# Patient Record
Sex: Female | Born: 1935
Health system: Southern US, Community
[De-identification: ages and names within clinical notes are randomized; demographics above are authoritative.]

## PROBLEM LIST (undated history)

## (undated) DIAGNOSIS — B029 Zoster without complications: Secondary | ICD-10-CM

## (undated) DIAGNOSIS — R569 Unspecified convulsions: Secondary | ICD-10-CM

## (undated) DIAGNOSIS — M722 Plantar fascial fibromatosis: Secondary | ICD-10-CM

## (undated) DIAGNOSIS — R55 Syncope and collapse: Secondary | ICD-10-CM

## (undated) DIAGNOSIS — N951 Menopausal and female climacteric states: Secondary | ICD-10-CM

## (undated) DIAGNOSIS — R7611 Nonspecific reaction to tuberculin skin test without active tuberculosis: Secondary | ICD-10-CM

## (undated) DIAGNOSIS — M810 Age-related osteoporosis without current pathological fracture: Secondary | ICD-10-CM

## (undated) DIAGNOSIS — F0390 Unspecified dementia without behavioral disturbance: Secondary | ICD-10-CM

## (undated) DIAGNOSIS — C55 Malignant neoplasm of uterus, part unspecified: Secondary | ICD-10-CM

## (undated) DIAGNOSIS — M549 Dorsalgia, unspecified: Secondary | ICD-10-CM

## (undated) DIAGNOSIS — I10 Essential (primary) hypertension: Secondary | ICD-10-CM

## (undated) HISTORY — DX: Menopausal and female climacteric states: N95.1

## (undated) HISTORY — PX: CATARACT EXTRACTION, BILATERAL: SHX1313

## (undated) HISTORY — PX: SIGMOIDOSCOPY: SUR1295

## (undated) HISTORY — PX: VAGINAL HYSTERECTOMY: SUR661

## (undated) HISTORY — PX: LAPAROSCOPIC CHOLECYSTECTOMY: SUR755

## (undated) HISTORY — DX: Zoster without complications: B02.9

## (undated) HISTORY — PX: DILATION AND CURETTAGE OF UTERUS: SHX78

## (undated) HISTORY — DX: Age-related osteoporosis without current pathological fracture: M81.0

## (undated) HISTORY — DX: Plantar fascial fibromatosis: M72.2

## (undated) HISTORY — PX: TONSILLECTOMY: SUR1361

## (undated) HISTORY — DX: Nonspecific reaction to tuberculin skin test without active tuberculosis: R76.11

## (undated) HISTORY — DX: Dorsalgia, unspecified: M54.9

---

## 1997-12-09 ENCOUNTER — Inpatient Hospital Stay (HOSPITAL_COMMUNITY): Admission: EM | Admit: 1997-12-09 | Discharge: 1997-12-10 | Payer: Self-pay | Admitting: Emergency Medicine

## 1997-12-13 ENCOUNTER — Encounter: Admission: RE | Admit: 1997-12-13 | Discharge: 1997-12-13 | Payer: Self-pay | Admitting: Family Medicine

## 1999-03-13 ENCOUNTER — Encounter: Payer: Self-pay | Admitting: Emergency Medicine

## 1999-03-13 ENCOUNTER — Emergency Department (HOSPITAL_COMMUNITY): Admission: EM | Admit: 1999-03-13 | Discharge: 1999-03-13 | Payer: Self-pay | Admitting: Emergency Medicine

## 2002-05-22 ENCOUNTER — Emergency Department (HOSPITAL_COMMUNITY): Admission: EM | Admit: 2002-05-22 | Discharge: 2002-05-22 | Payer: Self-pay | Admitting: Emergency Medicine

## 2004-10-22 ENCOUNTER — Ambulatory Visit: Payer: Self-pay | Admitting: Internal Medicine

## 2006-11-09 ENCOUNTER — Telehealth: Payer: Self-pay | Admitting: Internal Medicine

## 2006-11-15 ENCOUNTER — Encounter: Payer: Self-pay | Admitting: Internal Medicine

## 2006-11-16 ENCOUNTER — Ambulatory Visit: Payer: Self-pay | Admitting: Internal Medicine

## 2006-11-16 DIAGNOSIS — M722 Plantar fascial fibromatosis: Secondary | ICD-10-CM

## 2006-11-18 ENCOUNTER — Telehealth (INDEPENDENT_AMBULATORY_CARE_PROVIDER_SITE_OTHER): Payer: Self-pay | Admitting: *Deleted

## 2006-11-22 ENCOUNTER — Encounter: Payer: Self-pay | Admitting: Internal Medicine

## 2007-02-21 ENCOUNTER — Telehealth: Payer: Self-pay | Admitting: Internal Medicine

## 2007-03-11 ENCOUNTER — Ambulatory Visit: Payer: Self-pay | Admitting: Internal Medicine

## 2007-03-11 DIAGNOSIS — M810 Age-related osteoporosis without current pathological fracture: Secondary | ICD-10-CM

## 2007-03-23 ENCOUNTER — Telehealth (INDEPENDENT_AMBULATORY_CARE_PROVIDER_SITE_OTHER): Payer: Self-pay | Admitting: *Deleted

## 2007-06-08 ENCOUNTER — Telehealth: Payer: Self-pay | Admitting: Internal Medicine

## 2007-06-14 ENCOUNTER — Telehealth: Payer: Self-pay | Admitting: Internal Medicine

## 2007-06-20 ENCOUNTER — Ambulatory Visit: Payer: Self-pay | Admitting: Internal Medicine

## 2007-06-20 ENCOUNTER — Telehealth: Payer: Self-pay | Admitting: Internal Medicine

## 2007-06-20 DIAGNOSIS — N951 Menopausal and female climacteric states: Secondary | ICD-10-CM | POA: Insufficient documentation

## 2007-09-27 ENCOUNTER — Ambulatory Visit: Payer: Self-pay | Admitting: Internal Medicine

## 2007-09-27 DIAGNOSIS — B029 Zoster without complications: Secondary | ICD-10-CM | POA: Insufficient documentation

## 2008-02-23 ENCOUNTER — Encounter: Payer: Self-pay | Admitting: Internal Medicine

## 2008-04-30 ENCOUNTER — Ambulatory Visit: Payer: Self-pay | Admitting: Internal Medicine

## 2008-04-30 DIAGNOSIS — M549 Dorsalgia, unspecified: Secondary | ICD-10-CM | POA: Insufficient documentation

## 2008-08-13 ENCOUNTER — Ambulatory Visit: Payer: Self-pay | Admitting: Internal Medicine

## 2008-08-13 LAB — CONVERTED CEMR LAB
ALT: 20 units/L (ref 0–35)
AST: 28 units/L (ref 0–37)
Albumin: 3.7 g/dL (ref 3.5–5.2)
Alkaline Phosphatase: 55 units/L (ref 39–117)
BUN: 9 mg/dL (ref 6–23)
Basophils Absolute: 0 10*3/uL (ref 0.0–0.1)
Basophils Relative: 0.4 % (ref 0.0–3.0)
Bilirubin, Direct: 0.2 mg/dL (ref 0.0–0.3)
CO2: 30 meq/L (ref 19–32)
Calcium: 9.2 mg/dL (ref 8.4–10.5)
Chloride: 104 meq/L (ref 96–112)
Creatinine, Ser: 0.7 mg/dL (ref 0.4–1.2)
Eosinophils Absolute: 0.1 10*3/uL (ref 0.0–0.7)
Eosinophils Relative: 1 % (ref 0.0–5.0)
GFR calc non Af Amer: 106.06 mL/min (ref >60)
Glucose, Bld: 92 mg/dL (ref 70–99)
HCT: 45.3 % (ref 36.0–46.0)
Hemoglobin: 15.1 g/dL — ABNORMAL HIGH (ref 12.0–15.0)
Lymphocytes Relative: 42.8 % (ref 12.0–46.0)
Lymphs Abs: 3.1 10*3/uL (ref 0.7–4.0)
MCHC: 33.3 g/dL (ref 30.0–36.0)
MCV: 78.7 fL (ref 78.0–100.0)
Monocytes Absolute: 0.4 10*3/uL (ref 0.1–1.0)
Monocytes Relative: 5.5 % (ref 3.0–12.0)
Neutro Abs: 3.6 10*3/uL (ref 1.4–7.7)
Neutrophils Relative %: 50.3 % (ref 43.0–77.0)
Platelets: 236 10*3/uL (ref 150.0–400.0)
Potassium: 3.9 meq/L (ref 3.5–5.1)
RBC: 5.75 M/uL — ABNORMAL HIGH (ref 3.87–5.11)
RDW: 14.3 % (ref 11.5–14.6)
Sodium: 142 meq/L (ref 135–145)
TSH: 1.44 microintl units/mL (ref 0.35–5.50)
Total Bilirubin: 1.3 mg/dL — ABNORMAL HIGH (ref 0.3–1.2)
Total Protein: 7.5 g/dL (ref 6.0–8.3)
WBC: 7.2 10*3/uL (ref 4.5–10.5)

## 2008-08-20 ENCOUNTER — Telehealth: Payer: Self-pay | Admitting: Internal Medicine

## 2009-05-29 ENCOUNTER — Ambulatory Visit: Payer: Self-pay | Admitting: Cardiology

## 2009-05-29 ENCOUNTER — Observation Stay (HOSPITAL_COMMUNITY): Admission: EM | Admit: 2009-05-29 | Discharge: 2009-05-30 | Payer: Self-pay | Admitting: Emergency Medicine

## 2009-05-29 IMAGING — CR DG CHEST 1V PORT
1 series · 1 of 1 positions shown · non-contrast
Comparison: None.

CLINICAL DATA: Pain all over

PORTABLE CHEST - 1 VIEW

[view not recorded]
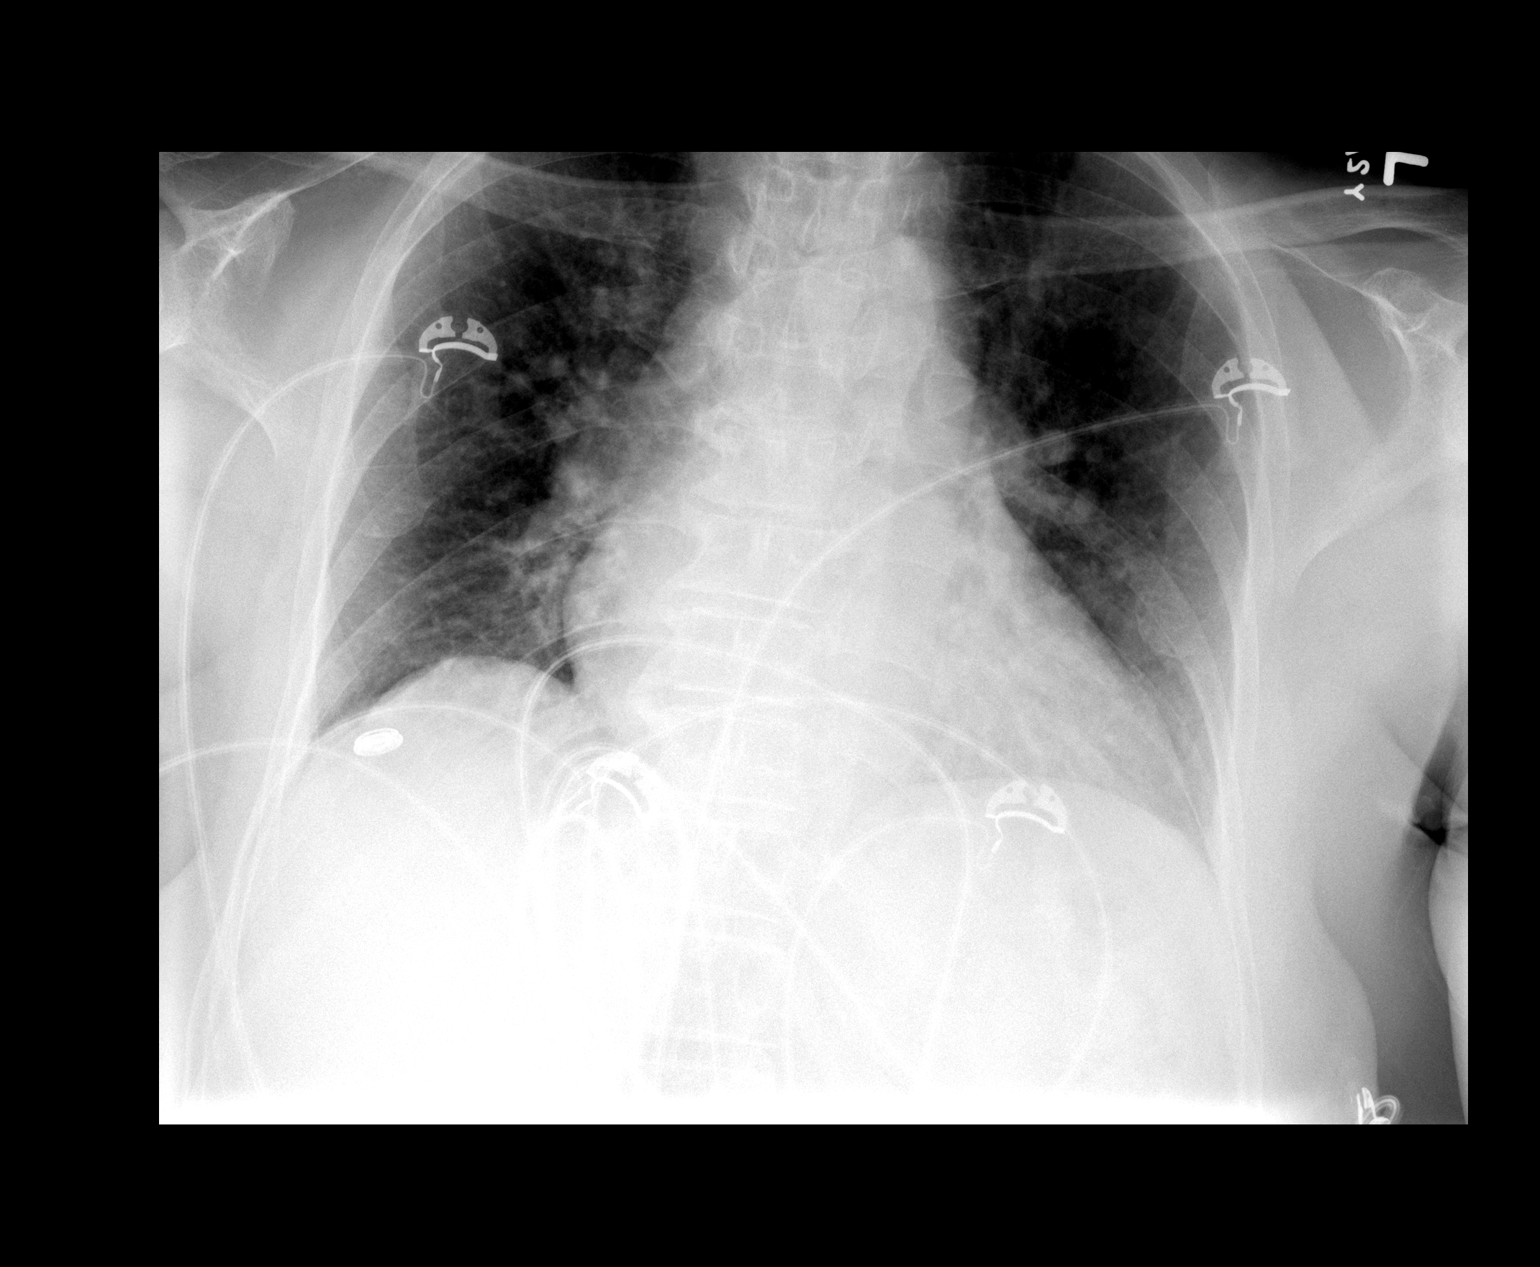

[1 of 1 positions shown; findings below may reference images not displayed]

FINDINGS: Cardiomegaly and pulmonary vascular congestion without
edema.  No pleural effusions.  Degenerative spondylosis of the
dorsal spine.
IMPRESSION: Cardiomegaly and pulmonary vascular congestion.

## 2009-05-30 ENCOUNTER — Encounter (INDEPENDENT_AMBULATORY_CARE_PROVIDER_SITE_OTHER): Payer: Self-pay | Admitting: Emergency Medicine

## 2009-06-04 ENCOUNTER — Telehealth: Payer: Self-pay | Admitting: Internal Medicine

## 2009-08-08 ENCOUNTER — Telehealth: Payer: Self-pay | Admitting: Internal Medicine

## 2009-08-09 ENCOUNTER — Encounter: Payer: Self-pay | Admitting: Internal Medicine

## 2009-08-21 ENCOUNTER — Ambulatory Visit: Payer: Self-pay | Admitting: Internal Medicine

## 2009-08-21 LAB — CONVERTED CEMR LAB
ALT: 19 units/L (ref 0–35)
AST: 28 units/L (ref 0–37)
Albumin: 3.9 g/dL (ref 3.5–5.2)
Alkaline Phosphatase: 45 units/L (ref 39–117)
BUN: 11 mg/dL (ref 6–23)
Basophils Absolute: 0 10*3/uL (ref 0.0–0.1)
Basophils Relative: 0.4 % (ref 0.0–3.0)
Bilirubin, Direct: 0.2 mg/dL (ref 0.0–0.3)
CO2: 33 meq/L — ABNORMAL HIGH (ref 19–32)
Calcium: 9.4 mg/dL (ref 8.4–10.5)
Chloride: 108 meq/L (ref 96–112)
Cholesterol: 211 mg/dL — ABNORMAL HIGH (ref 0–200)
Creatinine, Ser: 0.8 mg/dL (ref 0.4–1.2)
Direct LDL: 138.8 mg/dL
Eosinophils Absolute: 0.1 10*3/uL (ref 0.0–0.7)
Eosinophils Relative: 1 % (ref 0.0–5.0)
GFR calc non Af Amer: 90.65 mL/min (ref >60)
Glucose, Bld: 86 mg/dL (ref 70–99)
HCT: 47.1 % — ABNORMAL HIGH (ref 36.0–46.0)
HDL: 49.2 mg/dL (ref >39.00)
Hemoglobin: 15.3 g/dL — ABNORMAL HIGH (ref 12.0–15.0)
Lymphocytes Relative: 49.1 % — ABNORMAL HIGH (ref 12.0–46.0)
Lymphs Abs: 3.5 10*3/uL (ref 0.7–4.0)
MCHC: 32.5 g/dL (ref 30.0–36.0)
MCV: 81.5 fL (ref 78.0–100.0)
Monocytes Absolute: 0.4 10*3/uL (ref 0.1–1.0)
Monocytes Relative: 5.2 % (ref 3.0–12.0)
Neutro Abs: 3.1 10*3/uL (ref 1.4–7.7)
Neutrophils Relative %: 44.3 % (ref 43.0–77.0)
Platelets: 239 10*3/uL (ref 150.0–400.0)
Potassium: 4.6 meq/L (ref 3.5–5.1)
RBC: 5.78 M/uL — ABNORMAL HIGH (ref 3.87–5.11)
RDW: 15.3 % — ABNORMAL HIGH (ref 11.5–14.6)
Sodium: 145 meq/L (ref 135–145)
TSH: 1.48 microintl units/mL (ref 0.35–5.50)
Total Bilirubin: 1.2 mg/dL (ref 0.3–1.2)
Total CHOL/HDL Ratio: 4
Total Protein: 7.3 g/dL (ref 6.0–8.3)
Triglycerides: 113 mg/dL (ref 0.0–149.0)
VLDL: 22.6 mg/dL (ref 0.0–40.0)
WBC: 7.1 10*3/uL (ref 4.5–10.5)

## 2009-08-28 ENCOUNTER — Encounter (INDEPENDENT_AMBULATORY_CARE_PROVIDER_SITE_OTHER): Payer: Self-pay | Admitting: *Deleted

## 2010-03-27 NOTE — Progress Notes (Signed)
Summary: need letter  Phone Note Call from Patient Call back at Home Phone [phone]   Caller: Patient---live call Summary of Call: pt is summoned to have jury duty on Monday, July 25th 2011. She is requesting to be permanently exempt, due to her age and other health reasons. please dictate a letter. please mail letter at address on sheet. (copy should be enroute to Dr Kirtland Bouchard) Initial call taken by: Warnell Forester,  August 08, 2009 12:12 PM

## 2010-03-27 NOTE — Assessment & Plan Note (Signed)
Summary: emp--will fast//ccm RSC BMP OK PER DOC K/NJR   Vital Signs:  Patient profile:   75 year old female Height:      66.5 inches Weight:      188 pounds Temp:     98.0 degrees F oral BP sitting:   126 / 80  (left arm) Cuff size:   regular  Vitals Entered By: Duard Brady LPN (August 21, 2009 8:01 AM) CC: cpx - doing well Is Patient Diabetic? No   CC:  cpx - doing well.  History of Present Illness: 75 year old patient who is seen today for a wellness exam.  Medical problems include osteoporosis.  Mild obesity.  She does remarkably well.  She has had a recent cantered extraction surgery.  Here for Medicare AWV:  1.   Risk factors based on Past M, S, F history:  No cardiovascular risk factors family history of breast cancer 2.   Physical Activities: no activity restrictions, very little low, regular exercise 3.   Depression/mood: no history of depression or mood disorder 4.   Hearing: no hearing deficits 5.   ADL's: independent in all aspects of daily living 6.   Fall Risk: very low 7.   Home Safety: no problems identified 8.   Height, weight, &visual acuity:no change in height, or weight.  Has had recent right cataract surgery and is scheduled for left cataract extraction and lens implantation in the near future 9.   Counseling: annual mammogram.  Encouraged as well.  A screening colonoscopy 10.   Labs ordered based on risk factors: laboratory profile, including lipid panel, and TSH will be reviewed 11.           Referral Coordination-mammogram and colonoscopy scheduled 12.           Care Plan-modest weight loss, heart healthy diet and regular.  Exercise, all encouraged 13.            Cognitive Assessment-alert and oriented, with normal affect   Preventive Screening-Counseling & Management  Caffeine-Diet-Exercise     Does Patient Exercise: no  Allergies: 1)  ! Bicillin L-A (Penicillin G Benzathine)  Past History:  Past Medical History: Reviewed history from  03/11/2007 and no changes required. history of positive PPD Osteoporosis  Past Surgical History: Cholecystectomy 1994 Hysterectomy remote sigmoidoscopy cataract OD 07-2009  Family History: Reviewed history from 08/13/2008 and no changes required. father died age 52, coronary artery disease mother died age 29 breast cancer two brothers one sister positive for COPD, rheumatoid arthritis, dementia;   Social History: Reviewed history from 03/11/2007 and no changes required. Divorced CNA Regular exercise-no Does Patient Exercise:  no  Review of Systems  The patient denies anorexia, fever, weight loss, weight gain, vision loss, decreased hearing, hoarseness, chest pain, syncope, dyspnea on exertion, peripheral edema, prolonged cough, headaches, hemoptysis, abdominal pain, melena, hematochezia, severe indigestion/heartburn, hematuria, incontinence, genital sores, muscle weakness, suspicious skin lesions, transient blindness, difficulty walking, depression, unusual weight change, abnormal bleeding, enlarged lymph nodes, angioedema, and breast masses.    Physical Exam  General:  overweight-appearing.  120/82overweight-appearing.   Head:  Normocephalic and atraumatic without obvious abnormalities. No apparent alopecia or balding. Eyes:  No corneal or conjunctival inflammation noted. EOMI. Perrla. Funduscopic exam benign, without hemorrhages, exudates or papilledema. Vision grossly normal. Ears:  External ear exam shows no significant lesions or deformities.  Otoscopic examination reveals clear canals, tympanic membranes are intact bilaterally without bulging, retraction, inflammation or discharge. Hearing is grossly normal bilaterally. Nose:  External nasal examination  shows no deformity or inflammation. Nasal mucosa are pink and moist without lesions or exudates. Mouth:  Oral mucosa and oropharynx without lesions or exudates.  Teeth in good repair.pharynx pink and moist.  pharynx pink and  moist.   Neck:  No deformities, masses, or tenderness noted. Chest Wall:  No deformities, masses, or tenderness noted. Breasts:  No mass, nodules, thickening, tenderness, bulging, retraction, inflamation, nipple discharge or skin changes noted.   Lungs:  Normal respiratory effort, chest expands symmetrically. Lungs are clear to auscultation, no crackles or wheezes. Heart:  Normal rate and regular rhythm. S1 and S2 normal without gallop, murmur, click, rub or other extra sounds. Abdomen:  Bowel sounds positive,abdomen soft and non-tender without masses, organomegaly or hernias noted. Rectal:  No external abnormalities noted. Normal sphincter tone. No rectal masses or tenderness. Genitalia:  status post  hysterectomy.  No adnexal masses Msk:  No deformity or scoliosis noted of thoracic or lumbar spine.   Pulses:  R and L carotid,radial,femoral,dorsalis pedis and posterior tibial pulses are full and equal bilaterally Extremities:  No clubbing, cyanosis, edema, or deformity noted with normal full range of motion of all joints.   Neurologic:  No cranial nerve deficits noted. Station and gait are normal. Plantar reflexes are down-going bilaterally. DTRs are symmetrical throughout. Sensory, motor and coordinative functions appear intact. Skin:  Intact without suspicious lesions or rashes Cervical Nodes:  No lymphadenopathy noted Axillary Nodes:  No palpable lymphadenopathy Inguinal Nodes:  No significant adenopathy Psych:  Cognition and judgment appear intact. Alert and cooperative with normal attention span and concentration. No apparent delusions, illusions, hallucinations   Impression & Recommendations:  Problem # 1:  HEALTH MAINTENANCE EXAM (ICD-V70.0)  Orders: EKG w/ Interpretation (93000) First annual wellness visit with prevention plan  (W1191) Venipuncture (47829) TLB-Lipid Panel (80061-LIPID) TLB-BMP (Basic Metabolic Panel-BMET) (80048-METABOL) TLB-CBC Platelet - w/Differential  (85025-CBCD) TLB-Hepatic/Liver Function Pnl (80076-HEPATIC) TLB-TSH (Thyroid Stimulating Hormone) (84443-TSH) Gastroenterology Referral (GI)  Complete Medication List: 1)  Adult Aspirin Ec Low Strength 81 Mg Tbec (Aspirin) .Marland Kitchen.. 1 once daily 2)  Fosamax 70 Mg Tabs (Alendronate sodium) .Marland Kitchen.. 1 q week 3)  Flaxseed Oil 1000 Mg Caps (Flaxseed (linseed)) .... Qd 4)  Fish Oil 1000 Mg Caps (Omega-3 fatty acids) .... Qd 5)  Oscal 500/200 D-3 500-200 Mg-unit Tabs (Calcium-vitamin d) .... Qd  Patient Instructions: 1)  Please schedule a follow-up appointment in 1 year. 2)  Limit your Sodium (Salt). 3)  It is important that you exercise regularly at least 20 minutes 5 times a week. If you develop chest pain, have severe difficulty breathing, or feel very tired , stop exercising immediately and seek medical attention. 4)  You need to lose weight. Consider a lower calorie diet and regular exercise.  5)  Schedule your mammogram. 6)  Schedule a colonoscopy/sigmoidoscopy to help detect colon cancer. 7)  Take calcium +Vitamin D daily. Prescriptions: FOSAMAX 70 MG TABS (ALENDRONATE SODIUM) 1 q week  #12 Tablet x 6   Entered and Authorized by:   Gordy Savers  MD   Signed by:   Gordy Savers  MD on 08/21/2009   Method used:   Print then Give to Patient   RxID:   [id number]

## 2010-03-27 NOTE — Letter (Signed)
Summary: Referral - not able to see patient  [facility]  [address] [place], [place] [address]   Phone: [phone]  Fax: [phone]    August 28, 2009   Gordy Savers, M.D. [address] [place], [place] [address]   Re:   Nicole Blackwell DOB:  [birth year] MRN:   [id number]    Dear Dr. Amador Cunas:  Thank you for your kind referral of the above patient.  We have attempted to schedule the recommended procedure Screening Colonoscopy but have not been able to schedule because:  ___ The patient was not available by phone and/or has not returned our calls.   X  The patient declined to schedule the procedure at this time.  We appreciate the referral and hope that we will have the opportunity to treat this patient in the future.    Sincerely,    [facility] [phone]

## 2010-03-27 NOTE — Progress Notes (Signed)
Summary: Pt was in ER last week overnight. Does not want to sch follow up  Call back at [facility] Phone [phone]   Caller: Patient Summary of Call: Pt was admitted to [facility] ER overnight for heart issues, but pt was achey and having flu like symptoms.  Pt was told by ER to come in for follow up with PCP in a week, but pt says that she is feeling fine and doesnt want to sch a follow up. Pt is coming on 08/15/09 for annual emp.  Initial call taken by: Lucy Antigua,  June 04, 2009 1:04 PM    ROV 6-11 OK

## 2010-03-27 NOTE — Letter (Signed)
Summary: Generic Letter  [place] [facility]  [address] [place], [place] [address]   Phone: [phone]  Fax: [phone]    08/09/2009  KATALIN COLLEDGE [address] [place], [place]  [address]  Dear Milford Cage:  Mrs. Armato is a patient followed in our internal medicine practice.  Medical problems include osteoporosis, and a history of significant low back pain.  She has been summoned for jury service and has requested an excusal request.  She is age 75, and has served on jury service in the past.  Please excuse from jury service due to this a valid reason.        Sincerely,   Eleonore Chiquito  MD

## 2010-05-14 LAB — DIFFERENTIAL
Eosinophils Absolute: 0.1 10*3/uL (ref 0.0–0.7)
Lymphocytes Relative: 40 % (ref 12–46)
Lymphs Abs: 2.7 10*3/uL (ref 0.7–4.0)
Neutrophils Relative %: 53 % (ref 43–77)

## 2010-05-14 LAB — CK TOTAL AND CKMB (NOT AT ARMC)
CK, MB: 4.9 ng/mL — ABNORMAL HIGH (ref 0.3–4.0)
Relative Index: 1.1 (ref 0.0–2.5)
Total CK: 467 U/L — ABNORMAL HIGH (ref 7–177)
Total CK: 503 U/L — ABNORMAL HIGH (ref 7–177)

## 2010-05-14 LAB — URINALYSIS, ROUTINE W REFLEX MICROSCOPIC
Hgb urine dipstick: NEGATIVE
Nitrite: NEGATIVE
Specific Gravity, Urine: 1.016 (ref 1.005–1.030)
Urobilinogen, UA: 1 mg/dL (ref 0.0–1.0)

## 2010-05-14 LAB — URINE MICROSCOPIC-ADD ON

## 2010-05-14 LAB — POCT I-STAT, CHEM 8
Creatinine, Ser: 0.8 mg/dL (ref 0.4–1.2)
Hemoglobin: 17.3 g/dL — ABNORMAL HIGH (ref 12.0–15.0)
Potassium: 3.8 mEq/L (ref 3.5–5.1)
Sodium: 141 mEq/L (ref 135–145)

## 2010-05-14 LAB — BRAIN NATRIURETIC PEPTIDE: Pro B Natriuretic peptide (BNP): 32 pg/mL (ref 0.0–100.0)

## 2010-05-14 LAB — POCT CARDIAC MARKERS: Troponin i, poc: <0.05 ng/mL (ref 0.00–0.09)

## 2010-05-14 LAB — TROPONIN I
Troponin I: <0.01 ng/mL (ref 0.00–0.06)
Troponin I: <0.01 ng/mL (ref 0.00–0.06)

## 2010-05-14 LAB — CBC
Platelets: 219 10*3/uL (ref 150–400)
WBC: 6.7 10*3/uL (ref 4.0–10.5)

## 2010-05-14 LAB — URINE CULTURE: Colony Count: 5000

## 2010-08-22 ENCOUNTER — Encounter: Payer: Self-pay | Admitting: Internal Medicine

## 2010-08-25 ENCOUNTER — Telehealth: Payer: Self-pay | Admitting: *Deleted

## 2010-08-25 ENCOUNTER — Encounter: Payer: Self-pay | Admitting: Internal Medicine

## 2010-08-25 ENCOUNTER — Ambulatory Visit (INDEPENDENT_AMBULATORY_CARE_PROVIDER_SITE_OTHER): Payer: Medicare Other | Admitting: Internal Medicine

## 2010-08-25 ENCOUNTER — Other Ambulatory Visit: Payer: Self-pay | Admitting: Internal Medicine

## 2010-08-25 VITALS — BP 120/70 | HR 72 | Temp 98.2°F | Resp 16 | Ht 68.5 in | Wt 187.0 lb

## 2010-08-25 DIAGNOSIS — M549 Dorsalgia, unspecified: Secondary | ICD-10-CM

## 2010-08-25 DIAGNOSIS — Z1231 Encounter for screening mammogram for malignant neoplasm of breast: Secondary | ICD-10-CM

## 2010-08-25 DIAGNOSIS — Z79899 Other long term (current) drug therapy: Secondary | ICD-10-CM

## 2010-08-25 DIAGNOSIS — Z23 Encounter for immunization: Secondary | ICD-10-CM

## 2010-08-25 DIAGNOSIS — Z0000 Encounter for general adult medical examination without abnormal findings: Secondary | ICD-10-CM

## 2010-08-25 DIAGNOSIS — Z1211 Encounter for screening for malignant neoplasm of colon: Secondary | ICD-10-CM

## 2010-08-25 DIAGNOSIS — Z136 Encounter for screening for cardiovascular disorders: Secondary | ICD-10-CM

## 2010-08-25 DIAGNOSIS — M810 Age-related osteoporosis without current pathological fracture: Secondary | ICD-10-CM

## 2010-08-25 LAB — CBC WITH DIFFERENTIAL/PLATELET
Basophils Absolute: 0 10*3/uL (ref 0.0–0.1)
Eosinophils Absolute: 0.1 10*3/uL (ref 0.0–0.7)
Hemoglobin: 15.3 g/dL — ABNORMAL HIGH (ref 12.0–15.0)
Lymphocytes Relative: 46.7 % — ABNORMAL HIGH (ref 12.0–46.0)
MCHC: 32.1 g/dL (ref 30.0–36.0)
Monocytes Relative: 5 % (ref 3.0–12.0)
Neutro Abs: 3.6 10*3/uL (ref 1.4–7.7)
Neutrophils Relative %: 46.7 % (ref 43.0–77.0)
RDW: 15 % — ABNORMAL HIGH (ref 11.5–14.6)

## 2010-08-25 LAB — LDL CHOLESTEROL, DIRECT: Direct LDL: 160.7 mg/dL

## 2010-08-25 LAB — LIPID PANEL
Cholesterol: 214 mg/dL — ABNORMAL HIGH (ref 0–200)
HDL: 50.2 mg/dL (ref >39.00)
Total CHOL/HDL Ratio: 4
VLDL: 27 mg/dL (ref 0.0–40.0)

## 2010-08-25 LAB — HEPATIC FUNCTION PANEL
AST: 29 U/L (ref 0–37)
Albumin: 4.2 g/dL (ref 3.5–5.2)
Alkaline Phosphatase: 49 U/L (ref 39–117)
Total Bilirubin: 1.7 mg/dL — ABNORMAL HIGH (ref 0.3–1.2)

## 2010-08-25 LAB — TSH: TSH: 1.43 u[IU]/mL (ref 0.35–5.50)

## 2010-08-25 MED ORDER — ALENDRONATE SODIUM 70 MG PO TABS: 70.0000 mg | ORAL_TABLET | ORAL | Status: DC

## 2010-08-25 NOTE — Telephone Encounter (Signed)
I do see where dr. Amador Cunas recomends - will put order in [facility]

## 2010-08-25 NOTE — Patient Instructions (Signed)
It is important that you exercise regularly, at least 20 minutes 3 to 4 times per week.  If you develop chest pain or shortness of breath seek  medical attention.  You need to lose weight.  Consider a lower calorie diet and regular exercise.  Take a calcium supplement, plus [phone] units of vitamin D  Schedule your mammogram.  Schedule your colonoscopy to help detect colon cancer.

## 2010-08-25 NOTE — Progress Notes (Signed)
Subjective:    Patient ID: Nicole Blackwell, female    DOB: [birth year], 75 y.o.   MRN: [id number]  HPI  CC: OV and fasting. Declines pelvic.Marland Kitchen  History of Present Illness:  75 -year-old patient who is seen today for a comprehensive evaluation. Medical problems include the osteoporosis. She has been on the Fosamax. She has a history of back pain shingles and plantar fasciitis. She really has enjoyed excellent health. She does have a remote cholecystectomy and has a history of a positive PPD skin test. No concerns or complaints today   1. Risk factors, based on past  M,S,F history- Sister . and mother have a history of breast cancer.  2.  Physical activities: No activity restrictions although no regular exercise program  3.  Depression/mood: No history of depression or mood disorder 4.  Hearing: No hearing deficits  5.  ADL's: Independent in all aspects of daily living  6.  Fall risk: Low  7.  Home safety: No problems identified  8.  Height weight, and visual acuity; height and weight stable  9.  Counseling: More regular exercise and modest weight loss encouraged. A mammogram and colonoscopy recommended  10. Lab orders based on risk factors: Laboratory profile will be reviewed  11. Referral  :  Colonoscopy and mammogram recommended  12. Care plan: Heart healthy diet modest weight loss regimen and exercise program with walking 3-5 times per week recommended  13. Cognitive assessment:     Allergies:  1) ! Bicillin L-A (Penicillin G Benzathine)  Past History:  Past Medical History:  Reviewed history from 03/11/2007 and no changes required.  history of positive PPD  Osteoporosis  Past Surgical History:  Cholecystectomy 1994  Hysterectomy  remote sigmoidoscopy  Family History:  Reviewed history from 11/16/2006 and no changes required.  father died age 5, coronary artery disease  mother died age 66 breast cancer  two brothers one sister positive for COPD, rheumatoid arthritis,  dementia  Social History:  Reviewed history from 03/11/2007 and no changes required.  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  HENT: Negative for hearing loss, ear pain, nosebleeds, congestion, sore throat, mouth sores, trouble swallowing, neck stiffness, dental problem, voice change, sinus pressure and tinnitus.   Eyes: Negative for photophobia, pain, redness and visual disturbance.  Respiratory: Negative for cough, chest tightness and shortness of breath.   Cardiovascular: Negative for chest pain, palpitations and leg swelling.  Gastrointestinal: Negative for nausea, vomiting, abdominal pain, diarrhea, constipation, blood in stool, abdominal distention and rectal pain.  Genitourinary: Negative for dysuria, urgency, frequency, hematuria, flank pain, vaginal bleeding, vaginal discharge, difficulty urinating, genital sores, vaginal pain, menstrual problem and pelvic pain.  Musculoskeletal: Positive for back pain. Negative for arthralgias.  Skin: Negative for rash.  Neurological: Negative for dizziness, syncope, speech difficulty, weakness, light-headedness, numbness and headaches.  Hematological: Negative for adenopathy. Does not bruise/bleed easily.  Psychiatric/Behavioral: Negative for suicidal ideas, behavioral problems, self-injury, dysphoric mood and agitation. The patient is not nervous/anxious.        Objective:   Physical Exam  Constitutional: She is oriented to person, place, and time. She appears well-developed and well-nourished.  HENT:  Head: Normocephalic and atraumatic.  Right Ear: External ear normal.  Left Ear: External ear normal.  Mouth/Throat: Oropharynx is clear and moist.  Eyes: Conjunctivae and EOM are normal.  Neck: Normal range of motion. Neck supple. No JVD present.  No thyromegaly present.  Cardiovascular: Normal rate, regular rhythm, normal heart sounds and  intact distal pulses.   No murmur heard. Pulmonary/Chest: Effort normal and breath sounds normal. She has no wheezes. She has no rales.  Abdominal: Soft. Bowel sounds are normal. She exhibits no distension and no mass. There is no tenderness. There is no rebound and no guarding.  Genitourinary: Vagina normal. Guaiac negative stool. No vaginal discharge found.       Status post hysterectomy  Musculoskeletal: Normal range of motion. She exhibits no edema and no tenderness.  Neurological: She is alert and oriented to person, place, and time. She has normal reflexes. No cranial nerve deficit. She exhibits normal muscle tone. Coordination normal.  Skin: Skin is warm and dry. No rash noted.  Psychiatric: She has a normal mood and affect. Her behavior is normal.          Assessment & Plan:   Annual health assessment Osteoporosis Low back pain. Stable  Laboratory data will be reviewed. Regular exercise encouraged as well as modest weight loss. We'll continue calcium and vitamin D supplementation Mammogram and colonoscopy recommended

## 2010-08-25 NOTE — Telephone Encounter (Signed)
Pt states she was told today she needs a colonoscopy(no order in system).  Pt can not have colonoscopy on 09/10/10 due to having a mammogram that day.  Please enter order for colonoscopy and make a note in comment section, not to schedule on 09/10/10

## 2010-08-26 ENCOUNTER — Encounter: Payer: Self-pay | Admitting: Internal Medicine

## 2010-09-10 ENCOUNTER — Ambulatory Visit (HOSPITAL_COMMUNITY)
Admission: RE | Admit: 2010-09-10 | Discharge: 2010-09-10 | Disposition: A | Discharge status: Home / Self Care | Payer: Medicare Other | Source: Ambulatory Visit | Attending: Internal Medicine | Admitting: Internal Medicine

## 2010-09-10 DIAGNOSIS — Z1231 Encounter for screening mammogram for malignant neoplasm of breast: Secondary | ICD-10-CM | POA: Insufficient documentation

## 2010-09-16 ENCOUNTER — Ambulatory Visit (AMBULATORY_SURGERY_CENTER): Payer: Medicare Other | Admitting: *Deleted

## 2010-09-16 VITALS — Ht 69.0 in | Wt 185.0 lb

## 2010-09-16 DIAGNOSIS — Z1211 Encounter for screening for malignant neoplasm of colon: Secondary | ICD-10-CM

## 2010-09-16 MED ORDER — PEG-KCL-NACL-NASULF-NA ASC-C 100 G PO SOLR: ORAL | Status: DC

## 2010-09-30 ENCOUNTER — Encounter: Payer: Self-pay | Admitting: Internal Medicine

## 2010-09-30 ENCOUNTER — Ambulatory Visit (AMBULATORY_SURGERY_CENTER): Payer: Medicare Other | Admitting: Internal Medicine

## 2010-09-30 VITALS — BP 163/77 | HR 64 | Temp 98.4°F | Resp 19 | Ht 69.0 in | Wt 180.0 lb

## 2010-09-30 DIAGNOSIS — K5730 Diverticulosis of large intestine without perforation or abscess without bleeding: Secondary | ICD-10-CM

## 2010-09-30 DIAGNOSIS — Z1211 Encounter for screening for malignant neoplasm of colon: Secondary | ICD-10-CM

## 2010-09-30 DIAGNOSIS — K648 Other hemorrhoids: Secondary | ICD-10-CM

## 2010-09-30 MED ORDER — SODIUM CHLORIDE 0.9 % IV SOLN: 500.0000 mL | INTRAVENOUS | Status: DC

## 2010-10-01 ENCOUNTER — Telehealth: Payer: Self-pay | Admitting: *Deleted

## 2010-10-01 NOTE — Telephone Encounter (Signed)

## 2010-10-20 ENCOUNTER — Encounter: Payer: Self-pay | Admitting: Internal Medicine

## 2010-10-20 ENCOUNTER — Ambulatory Visit (INDEPENDENT_AMBULATORY_CARE_PROVIDER_SITE_OTHER): Payer: Medicare Other | Admitting: Internal Medicine

## 2010-10-20 DIAGNOSIS — M549 Dorsalgia, unspecified: Secondary | ICD-10-CM

## 2010-10-20 DIAGNOSIS — M810 Age-related osteoporosis without current pathological fracture: Secondary | ICD-10-CM

## 2010-10-20 NOTE — Progress Notes (Signed)
  Subjective:    Patient ID: Nicole Blackwell, female    DOB: 1935/08/01, 75 y.o.   MRN: [id number]  HPI 75 year old female who presents with a two-month history of primarily hip pain. The right side is affected more than the left she also describes pain involving her left anterior lower leg. Denies any early morning stiffness. Pain seems paroxysmal and does not seem to be related to activity such as walking she occasionally is pain free through the night but at times has some mild discomfort she has not taken any medications such as OTC anti-inflammatories or Tylenol. She is on Fosamax for osteoporosis. No systemic symptoms. No history of trauma   Review of Systems  Constitutional: Negative.   HENT: Negative for hearing loss, congestion, sore throat, rhinorrhea, dental problem, sinus pressure and tinnitus.   Eyes: Negative for pain, discharge and visual disturbance.  Respiratory: Negative for cough and shortness of breath.   Cardiovascular: Negative for chest pain, palpitations and leg swelling.  Gastrointestinal: Negative for nausea, vomiting, abdominal pain, diarrhea, constipation, blood in stool and abdominal distention.  Genitourinary: Negative for dysuria, urgency, frequency, hematuria, flank pain, vaginal bleeding, vaginal discharge, difficulty urinating, vaginal pain and pelvic pain.  Musculoskeletal: Positive for back pain. Negative for joint swelling, arthralgias and gait problem.  Skin: Negative for rash.  Neurological: Negative for dizziness, syncope, speech difficulty, weakness, numbness and headaches.  Hematological: Negative for adenopathy.  Psychiatric/Behavioral: Negative for behavioral problems, dysphoric mood and agitation. The patient is not nervous/anxious.        Objective:   Physical Exam  Constitutional: She is oriented to person, place, and time. She appears well-developed and well-nourished.  HENT:  Head: Normocephalic.  Right Ear: External ear normal.  Left Ear:  External ear normal.  Mouth/Throat: Oropharynx is clear and moist.  Eyes: Conjunctivae and EOM are normal. Pupils are equal, round, and reactive to light.  Neck: Normal range of motion. Neck supple. No thyromegaly present.  Cardiovascular: Normal rate, regular rhythm, normal heart sounds and intact distal pulses.   Pulmonary/Chest: Effort normal and breath sounds normal.  Abdominal: Soft. Bowel sounds are normal. She exhibits no mass. There is no tenderness.  Musculoskeletal: Normal range of motion.       No signs of active synovitis Range of motion both hips appear to be intact   Lymphadenopathy:    She has no cervical adenopathy.  Neurological: She is alert and oriented to person, place, and time.  Skin: Skin is warm and dry. No rash noted.  Psychiatric: She has a normal mood and affect. Her behavior is normal.          Assessment & Plan:   Nonspecific hip and leg pain suspect more musculoligamentous. We'll check a sedimentation rate. We'll give samples of Vimovo  to take twice daily and clinically observed

## 2010-10-20 NOTE — Patient Instructions (Signed)
Vimovo  One twice daily   Call or return to clinic prn if these symptoms worsen or fail to improve as anticipated.

## 2010-10-21 LAB — SEDIMENTATION RATE: Sed Rate: 6 mm/hr (ref 0–22)

## 2011-06-05 ENCOUNTER — Telehealth: Payer: Self-pay | Admitting: *Deleted

## 2011-06-05 NOTE — Telephone Encounter (Signed)
Left arm and wrist started to hurt after her last dose of Fosamax.

## 2011-06-08 NOTE — Telephone Encounter (Signed)
Doubt  there is a relationship but suggest to  the patient that she hold the Fosamax until the arm pain has resolved and then to resume

## 2011-06-08 NOTE — Telephone Encounter (Signed)
Notified pt. She also developed a contusion with no obvious injury, but prefers to wait and see what happens to this before making an appt.

## 2011-08-31 ENCOUNTER — Encounter: Payer: Self-pay | Admitting: Internal Medicine

## 2011-08-31 ENCOUNTER — Ambulatory Visit (INDEPENDENT_AMBULATORY_CARE_PROVIDER_SITE_OTHER): Payer: Medicare Other | Admitting: Internal Medicine

## 2011-08-31 VITALS — BP 122/72 | HR 72 | Temp 97.9°F | Resp 16 | Ht 68.5 in | Wt 183.0 lb

## 2011-08-31 DIAGNOSIS — M810 Age-related osteoporosis without current pathological fracture: Secondary | ICD-10-CM

## 2011-08-31 DIAGNOSIS — Z0000 Encounter for general adult medical examination without abnormal findings: Secondary | ICD-10-CM

## 2011-08-31 DIAGNOSIS — M545 Low back pain: Secondary | ICD-10-CM

## 2011-08-31 DIAGNOSIS — Z136 Encounter for screening for cardiovascular disorders: Secondary | ICD-10-CM

## 2011-08-31 LAB — BASIC METABOLIC PANEL
CO2: 26 mEq/L (ref 19–32)
Calcium: 9.1 mg/dL (ref 8.4–10.5)
Creatinine, Ser: 0.9 mg/dL (ref 0.4–1.2)
GFR: 84.05 mL/min (ref >60.00)
Sodium: 141 mEq/L (ref 135–145)

## 2011-08-31 LAB — CBC WITH DIFFERENTIAL/PLATELET
Basophils Absolute: 0 10*3/uL (ref 0.0–0.1)
Eosinophils Absolute: 0.1 10*3/uL (ref 0.0–0.7)
HCT: 47.9 % — ABNORMAL HIGH (ref 36.0–46.0)
Hemoglobin: 15.1 g/dL — ABNORMAL HIGH (ref 12.0–15.0)
Lymphs Abs: 2.6 10*3/uL (ref 0.7–4.0)
MCHC: 31.5 g/dL (ref 30.0–36.0)
MCV: 81.6 fl (ref 78.0–100.0)
Monocytes Absolute: 0.3 10*3/uL (ref 0.1–1.0)
Monocytes Relative: 5.1 % (ref 3.0–12.0)
Neutro Abs: 3.3 10*3/uL (ref 1.4–7.7)
Platelets: 240 10*3/uL (ref 150.0–400.0)
RDW: 15.4 % — ABNORMAL HIGH (ref 11.5–14.6)

## 2011-08-31 LAB — LIPID PANEL
Cholesterol: 237 mg/dL — ABNORMAL HIGH (ref 0–200)
HDL: 58.6 mg/dL (ref >39.00)
Triglycerides: 107 mg/dL (ref 0.0–149.0)

## 2011-08-31 LAB — HEPATIC FUNCTION PANEL
ALT: 16 U/L (ref 0–35)
AST: 27 U/L (ref 0–37)
Total Bilirubin: 1.5 mg/dL — ABNORMAL HIGH (ref 0.3–1.2)
Total Protein: 7.3 g/dL (ref 6.0–8.3)

## 2011-08-31 LAB — POCT URINALYSIS DIPSTICK
Bilirubin, UA: NEGATIVE
Nitrite, UA: NEGATIVE
Protein, UA: NEGATIVE
pH, UA: 5.5

## 2011-08-31 LAB — LDL CHOLESTEROL, DIRECT: Direct LDL: 154 mg/dL

## 2011-08-31 LAB — TSH: TSH: 1.91 u[IU]/mL (ref 0.35–5.50)

## 2011-08-31 NOTE — Progress Notes (Signed)
Patient ID: Nicole Blackwell, female   DOB: 09-18-1935, 76 y.o.   MRN: [id number]  Subjective:    Patient ID: Nicole Blackwell, female    DOB: 1935/09/23, 76 y.o.   MRN: [id number]  HPI  CC: OV and fasting. Declines pelvic.Marland Kitchen  History of Present Illness:   76 year-old patient who is seen today for a comprehensive evaluation. Medical problems include the osteoporosis. She has been on the Fosamax but has d/c ed this med ecently.  She has a history of back pain shingles and plantar fasciitis. She really has enjoyed excellent health. She does have a remote cholecystectomy and has a history of a positive PPD skin test. No concerns or complaints today   1. Risk factors, based on past  M,S,F history- Sister . and mother have a history of breast cancer.  2.  Physical activities: No activity restrictions although no regular exercise program  3.  Depression/mood: No history of depression or mood disorder 4.  Hearing: No hearing deficits  5.  ADL's: Independent in all aspects of daily living  6.  Fall risk: Low  7.  Home safety: No problems identified  8.  Height weight, and visual acuity; height and weight stable  9.  Counseling: More regular exercise and modest weight loss encouraged. A mammogram and colonoscopy recommended  10. Lab orders based on risk factors: Laboratory profile will be reviewed  11. Referral  :  Colonoscopy and mammogram recommended  12. Care plan: Heart healthy diet modest weight loss regimen and exercise program with walking 3-5 times per week recommended  13. Cognitive assessment:     Allergies:  1) ! Bicillin L-A (Penicillin G Benzathine)  Past History:  Past Medical History:  Reviewed history from 03/11/2007 and no changes required.  history of positive PPD  Osteoporosis  Past Surgical History:  Cholecystectomy 1994  Hysterectomy  remote sigmoidoscopy  Family History:  Reviewed history from 11/16/2006 and no changes required.  father died age 62, coronary  artery disease  mother died age 11 breast cancer  two brothers one sister positive for COPD, rheumatoid arthritis, dementia  Social History:  Reviewed history from 03/11/2007 and no changes required.  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  HENT: Negative for hearing loss, ear pain, nosebleeds, congestion, sore throat, mouth sores, trouble swallowing, neck stiffness, dental problem, voice change, sinus pressure and tinnitus.   Eyes: Negative for photophobia, pain, redness and visual disturbance.  Respiratory: Negative for cough, chest tightness and shortness of breath.   Cardiovascular: Negative for chest pain, palpitations and leg swelling.  Gastrointestinal: Negative for nausea, vomiting, abdominal pain, diarrhea, constipation, blood in stool, abdominal distention and rectal pain.  Genitourinary: Negative for dysuria, urgency, frequency, hematuria, flank pain, vaginal bleeding, vaginal discharge, difficulty urinating, genital sores, vaginal pain, menstrual problem and pelvic pain.  Musculoskeletal: Positive for back pain. Negative for arthralgias.  Skin: Negative for rash.  Neurological: Negative for dizziness, syncope, speech difficulty, weakness, light-headedness, numbness and headaches.  Hematological: Negative for adenopathy. Does not bruise/bleed easily.  Psychiatric/Behavioral: Negative for suicidal ideas, behavioral problems, self-injury, dysphoric mood and agitation. The patient is not nervous/anxious.        Objective:   Physical Exam  Constitutional: She is oriented to person, place, and time. She appears well-developed and well-nourished.  HENT:  Head: Normocephalic and atraumatic.  Right Ear: External ear normal.  Left Ear: External ear  normal.  Mouth/Throat: Oropharynx is clear and moist.  Eyes: Conjunctivae and EOM are normal.  Neck: Normal range of motion. Neck  supple. No JVD present. No thyromegaly present.  Cardiovascular: Normal rate, regular rhythm, normal heart sounds and intact distal pulses.   No murmur heard. Pulmonary/Chest: Effort normal and breath sounds normal. She has no wheezes. She has no rales.  Abdominal: Soft. Bowel sounds are normal. She exhibits no distension and no mass. There is no tenderness. There is no rebound and no guarding.  Genitourinary: Vagina normal. Guaiac negative stool. No vaginal discharge found.       Status post hysterectomy  Musculoskeletal: Normal range of motion. She exhibits no edema and no tenderness.  Neurological: She is alert and oriented to person, place, and time. She has normal reflexes. No cranial nerve deficit. She exhibits normal muscle tone. Coordination normal.  Skin: Skin is warm and dry. No rash noted.  Psychiatric: She has a normal mood and affect. Her behavior is normal.          Assessment & Plan:   Annual health assessment Osteoporosis; off fosamax- repeat Bone Density in 2 years Low back pain. Stable  Laboratory data will be reviewed. Regular exercise encouraged as well as modest weight loss. We'll continue calcium and vitamin D supplementation

## 2011-08-31 NOTE — Patient Instructions (Signed)
It is important that you exercise regularly, at least 20 minutes 3 to 4 times per week.  If you develop chest pain or shortness of breath seek  medical attention.  Take a calcium supplement, plus 800-1200 units of vitamin D  Return in one year for follow-up   

## 2012-01-09 ENCOUNTER — Emergency Department (HOSPITAL_COMMUNITY): Payer: Medicare Other

## 2012-01-09 ENCOUNTER — Emergency Department (HOSPITAL_COMMUNITY)
Admission: EM | Admit: 2012-01-09 | Discharge: 2012-01-09 | Disposition: A | Discharge status: Home / Self Care | Payer: Medicare Other | Attending: Emergency Medicine | Admitting: Emergency Medicine

## 2012-01-09 ENCOUNTER — Encounter (HOSPITAL_COMMUNITY): Payer: Self-pay | Admitting: Emergency Medicine

## 2012-01-09 DIAGNOSIS — W19XXXA Unspecified fall, initial encounter: Secondary | ICD-10-CM | POA: Insufficient documentation

## 2012-01-09 DIAGNOSIS — Y939 Activity, unspecified: Secondary | ICD-10-CM | POA: Insufficient documentation

## 2012-01-09 DIAGNOSIS — S0011XA Contusion of right eyelid and periocular area, initial encounter: Secondary | ICD-10-CM

## 2012-01-09 DIAGNOSIS — Z8739 Personal history of other diseases of the musculoskeletal system and connective tissue: Secondary | ICD-10-CM | POA: Insufficient documentation

## 2012-01-09 DIAGNOSIS — Z79899 Other long term (current) drug therapy: Secondary | ICD-10-CM | POA: Insufficient documentation

## 2012-01-09 DIAGNOSIS — Z7982 Long term (current) use of aspirin: Secondary | ICD-10-CM | POA: Insufficient documentation

## 2012-01-09 DIAGNOSIS — Y929 Unspecified place or not applicable: Secondary | ICD-10-CM | POA: Insufficient documentation

## 2012-01-09 DIAGNOSIS — N951 Menopausal and female climacteric states: Secondary | ICD-10-CM | POA: Insufficient documentation

## 2012-01-09 DIAGNOSIS — R404 Transient alteration of awareness: Secondary | ICD-10-CM | POA: Insufficient documentation

## 2012-01-09 DIAGNOSIS — S0010XA Contusion of unspecified eyelid and periocular area, initial encounter: Secondary | ICD-10-CM | POA: Insufficient documentation

## 2012-01-09 DIAGNOSIS — R55 Syncope and collapse: Secondary | ICD-10-CM | POA: Insufficient documentation

## 2012-01-09 DIAGNOSIS — Z8619 Personal history of other infectious and parasitic diseases: Secondary | ICD-10-CM | POA: Insufficient documentation

## 2012-01-09 DIAGNOSIS — M810 Age-related osteoporosis without current pathological fracture: Secondary | ICD-10-CM | POA: Insufficient documentation

## 2012-01-09 LAB — URINALYSIS, ROUTINE W REFLEX MICROSCOPIC
Glucose, UA: NEGATIVE mg/dL
Hgb urine dipstick: NEGATIVE
Specific Gravity, Urine: 1.035 — ABNORMAL HIGH (ref 1.005–1.030)

## 2012-01-09 LAB — URINE MICROSCOPIC-ADD ON

## 2012-01-09 LAB — COMPREHENSIVE METABOLIC PANEL
ALT: 12 U/L (ref 0–35)
AST: 22 U/L (ref 0–37)
Alkaline Phosphatase: 53 U/L (ref 39–117)
CO2: 28 mEq/L (ref 19–32)
Calcium: 9.6 mg/dL (ref 8.4–10.5)
GFR calc Af Amer: 75 mL/min — ABNORMAL LOW (ref >90)
GFR calc non Af Amer: 65 mL/min — ABNORMAL LOW (ref >90)
Glucose, Bld: 114 mg/dL — ABNORMAL HIGH (ref 70–99)
Potassium: 3.5 mEq/L (ref 3.5–5.1)
Sodium: 140 mEq/L (ref 135–145)
Total Protein: 7 g/dL (ref 6.0–8.3)

## 2012-01-09 LAB — CBC WITH DIFFERENTIAL/PLATELET
Basophils Absolute: 0 10*3/uL (ref 0.0–0.1)
Eosinophils Relative: 0 % (ref 0–5)
Lymphocytes Relative: 38 % (ref 12–46)
Lymphs Abs: 2.9 10*3/uL (ref 0.7–4.0)
Neutrophils Relative %: 58 % (ref 43–77)
Platelets: 230 10*3/uL (ref 150–400)
RBC: 6.13 MIL/uL — ABNORMAL HIGH (ref 3.87–5.11)
RDW: 15.2 % (ref 11.5–15.5)
WBC: 7.8 10*3/uL (ref 4.0–10.5)

## 2012-01-09 LAB — MAGNESIUM: Magnesium: 2.3 mg/dL (ref 1.5–2.5)

## 2012-01-09 IMAGING — CT CT HEAD W/O CM
1 series · 14 of 30 positions shown, 18 images · non-contrast
Comparison: Head CT [DATE].

CT HEAD

CLINICAL DATA: History of syncope with injury to the head and
face.

CT HEAD WITHOUT CONTRAST
CT MAXILLOFACIAL WITHOUT CONTRAST
CT CERVICAL SPINE WITHOUT CONTRAST
TECHNIQUE: Multidetector CT imaging of the head, cervical spine,
and maxillofacial structures were performed using the standard
protocol without intravenous contrast. Multiplanar CT image
reconstructions of the cervical spine and maxillofacial structures
were also generated.

[Series 3: facial st · axial · 0.35mm/px · z∈[-230,-90]mm · 14 of 79 slices shown, 18 images]
[im 6/79  brain]
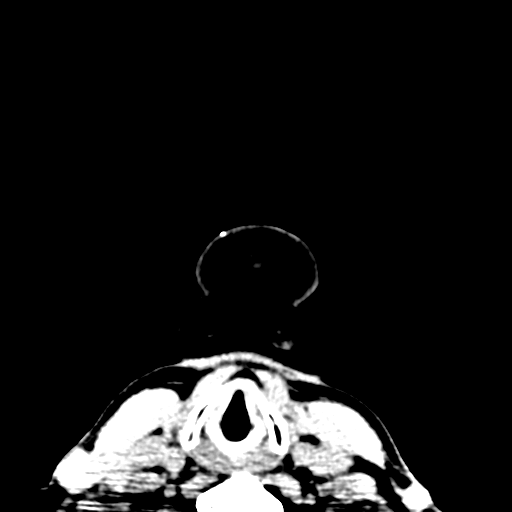
[im 6/79  bone]
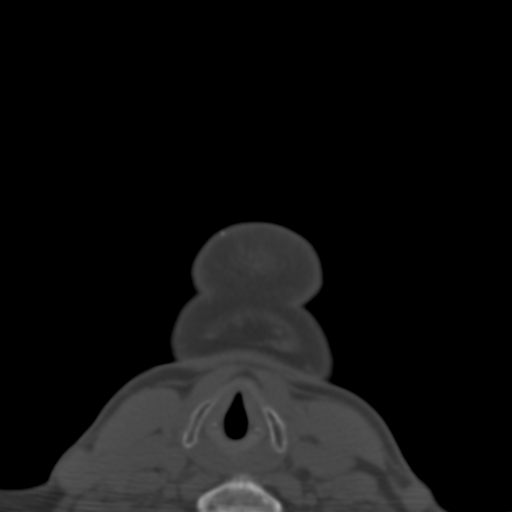
[im 11/79  brain]
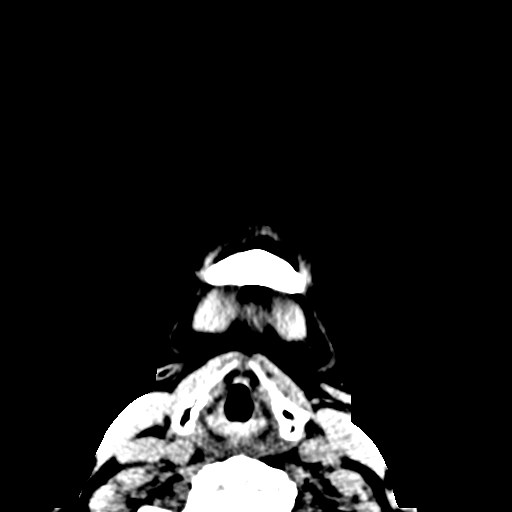
[im 17/79  brain]
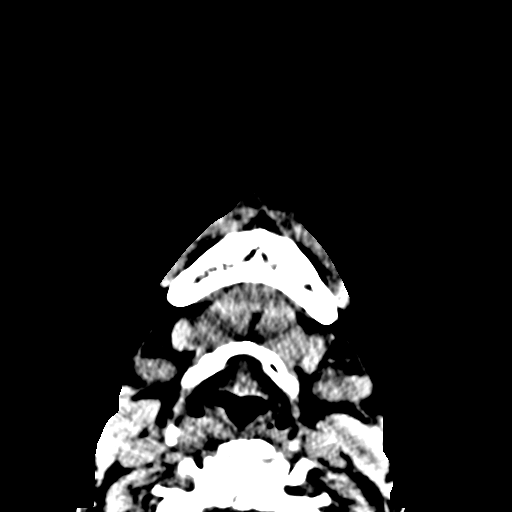
[im 22/79  brain]
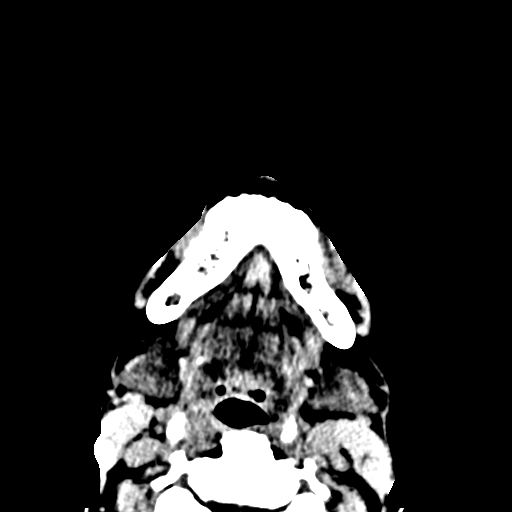
[im 27/79  brain]
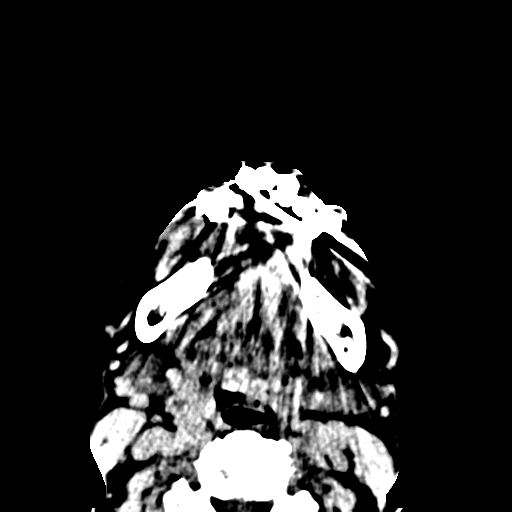
[im 27/79  bone]
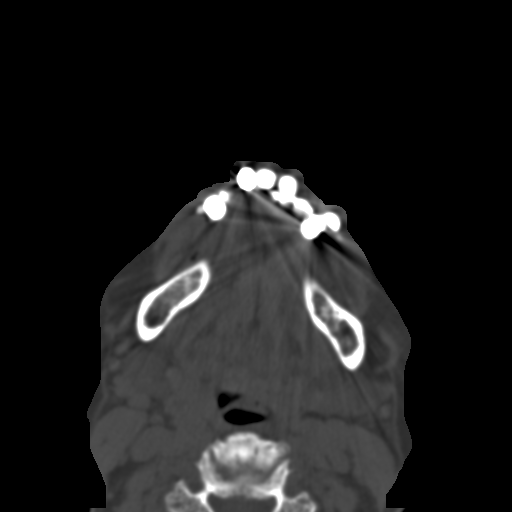
[im 33/79  brain]
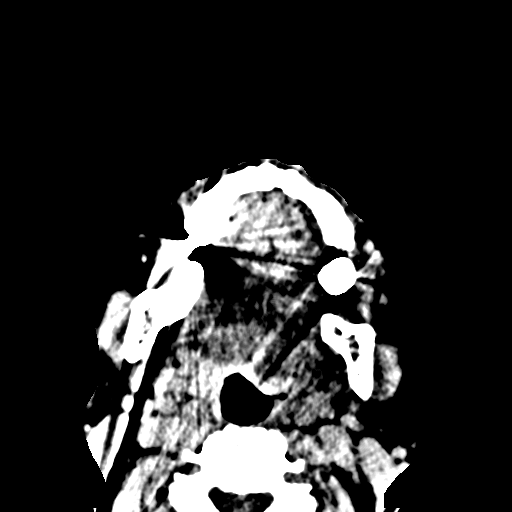
[im 38/79  brain]
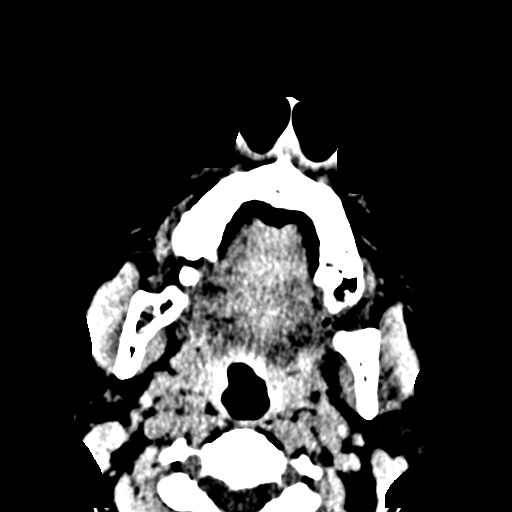
[im 44/79  brain]
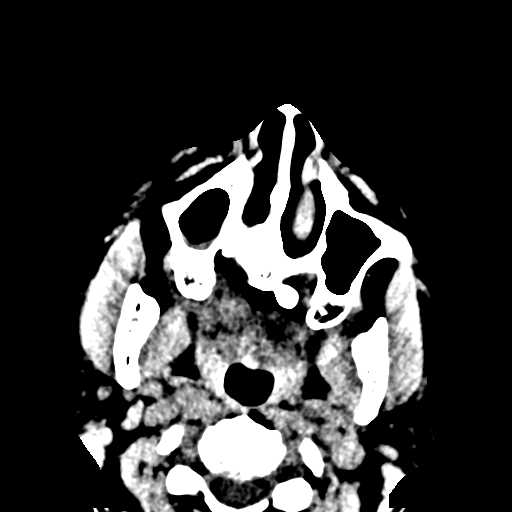
[im 49/79  brain]
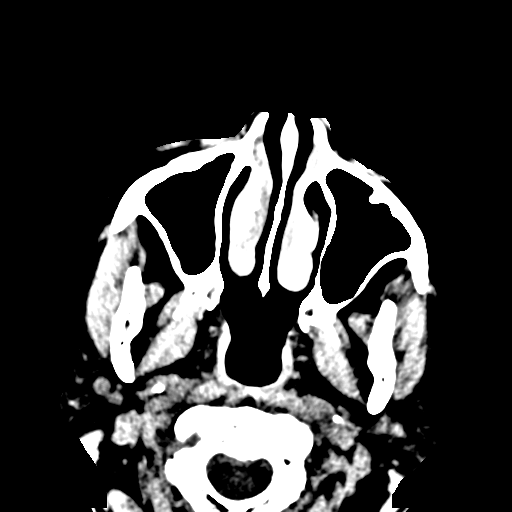
[im 49/79  bone]
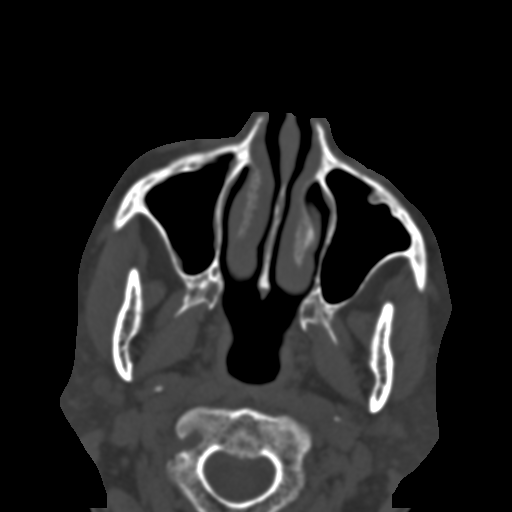
[im 54/79  brain]
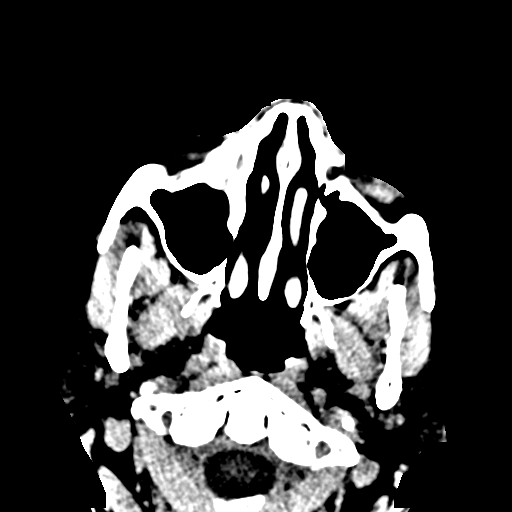
[im 60/79  brain]
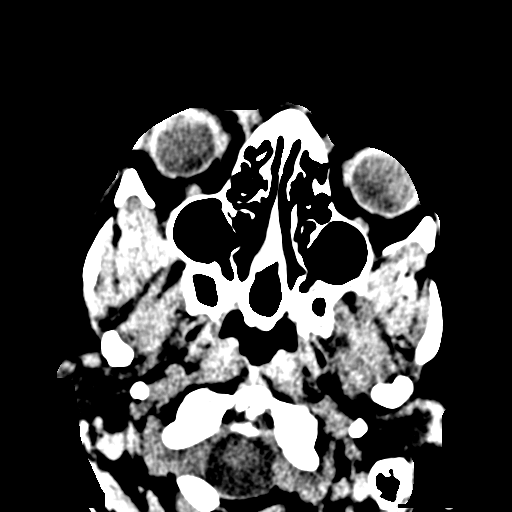
[im 65/79  brain]
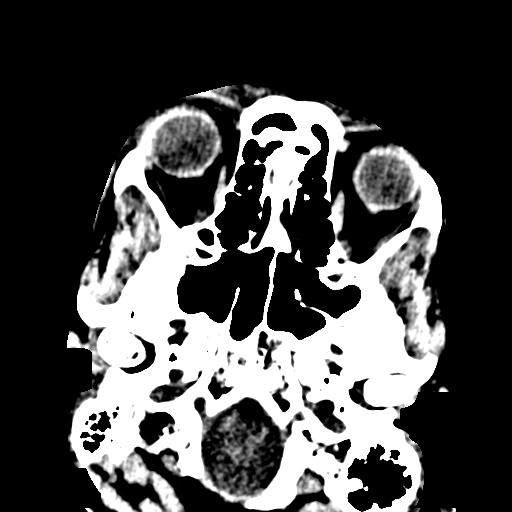
[im 70/79  brain]
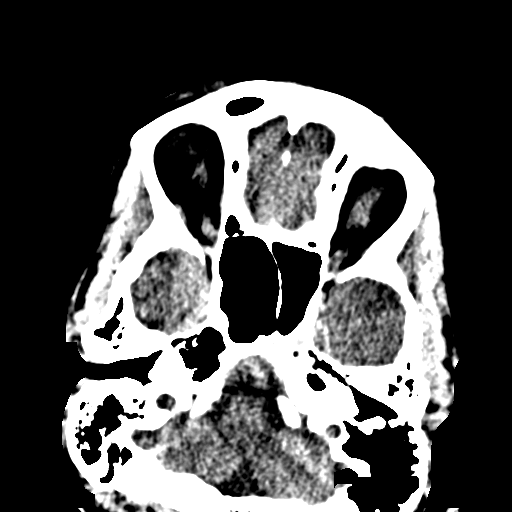
[im 70/79  bone]
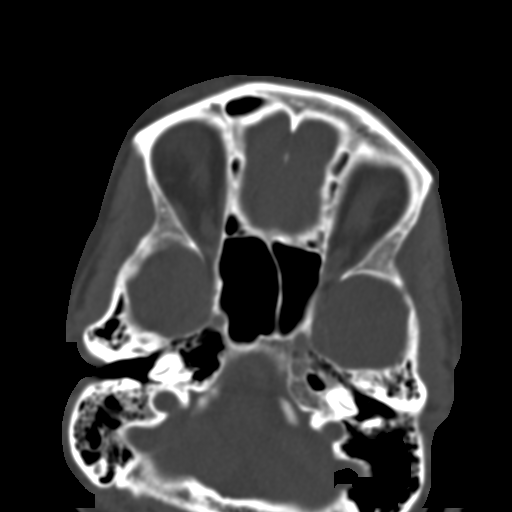
[im 76/79  brain]
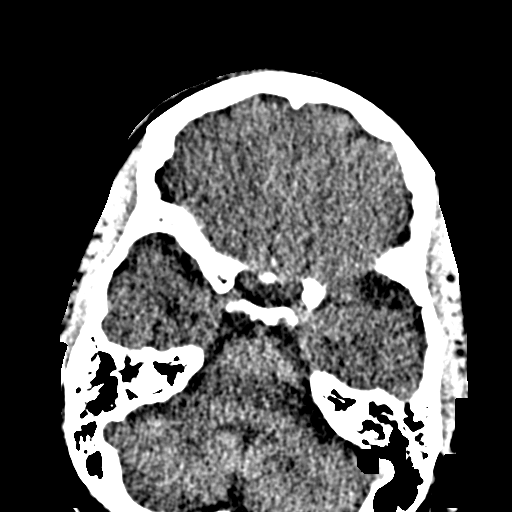

[14 of 30 positions shown; findings below may reference images not displayed]

FINDINGS: [Soft tissue swelling in the right frontal scalp
extending into the periorbital region.  Background of mild cerebral
and cerebellar atrophy.  Patchy and confluent areas of decreased
attenuation throughout the deep and periventricular white matter of
the cerebral hemispheres bilaterally, most compatible with chronic
microvascular ischemic disease. No acute displaced skull fractures
are identified.  No acute intracranial abnormality.  Specifically,
no evidence of acute post-traumatic intracranial hemorrhage, no
definite regions of acute/subacute cerebral ischemia, no focal
mass, mass effect, hydrocephalus or abnormal intra or extra-axial
fluid collections.  The visualized paranasal sinuses and mastoids
are well pneumatized.]
IMPRESSION: [1.  Soft tissue swelling in the right frontal scalp and
periorbital region without displaced skull fracture or acute
intracranial findings.
2.  Mild cerebral and cerebellar atrophy.
3.  Chronic microvascular ischemic changes throughout the deep and
periventricular white matter of the cerebral hemispheres
bilaterally.]

CT MAXILLOFACIAL
FINDINGS: Soft tissue swelling in the right frontal scalp
extending into the periorbital region.  The right orbit, globe and
no retrobulbar soft tissues are normal in appearance.  No acute
displaced facial bone fractures are identified. Mild mucosal
thickening in the right maxillary sinus.  No evidence of hemosinus.
Mandibular condyles are properly located bilaterally.
IMPRESSION: 1.  Soft tissue contusion in the right frontal scalp and
periorbital region without underlying displaced facial bone
fractures.

CT CERVICAL SPINE
FINDINGS: No acute displaced cervical spine fractures are
identified.  There is severe multilevel degenerative disc disease,
most pronounced at C4-C5, C5-C6 and C6-C7.  There is also
multilevel facet arthropathy.  These degenerative changes result in
some reversal of normal cervical lordosis centered at C3, which is
likely chronic.  No prevertebral soft tissue swelling to suggest
acute injury at this time.  Visualized portions of the upper thorax
are unremarkable.
IMPRESSION: 1.  No evidence of significant acute traumatic injury to the
cervical spine.
2.  Severe multilevel degenerative disc disease and cervical
spondylosis, as detailed above.

## 2012-01-09 IMAGING — CT CT HEAD W/O CM
2 of 4 series · 13 of 30 positions shown, 16 images · non-contrast
Comparison: Head CT [DATE].

CT HEAD

CLINICAL DATA: History of syncope with injury to the head and
face.

CT HEAD WITHOUT CONTRAST
CT MAXILLOFACIAL WITHOUT CONTRAST
CT CERVICAL SPINE WITHOUT CONTRAST
TECHNIQUE: Multidetector CT imaging of the head, cervical spine,
and maxillofacial structures were performed using the standard
protocol without intravenous contrast. Multiplanar CT image
reconstructions of the cervical spine and maxillofacial structures
were also generated.

[Series 4: bone windows · axial · 0.42mm/px · z∈[-150,-60]mm · 4 of 51 slices shown]
[im 11/51  bone]
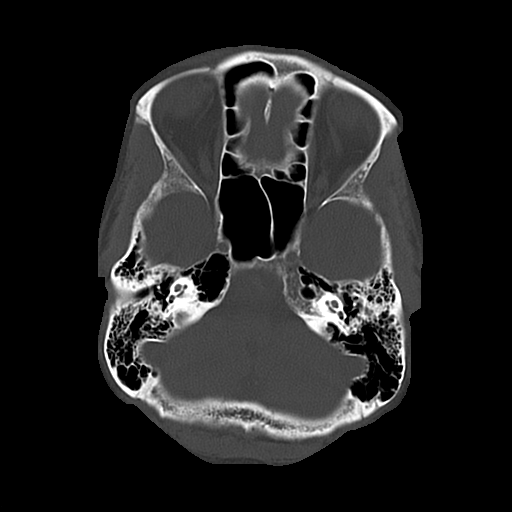
[im 21/51  bone]
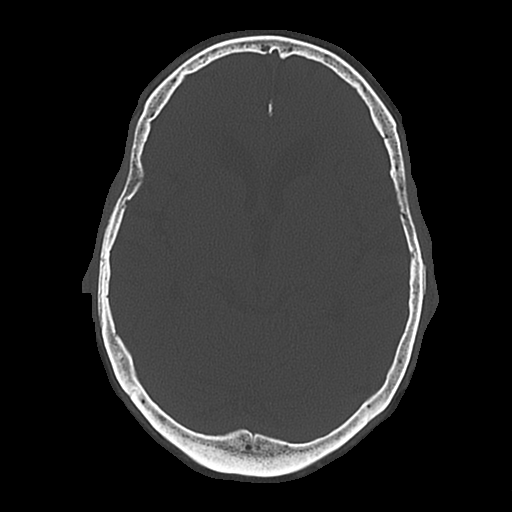
[im 31/51  bone]
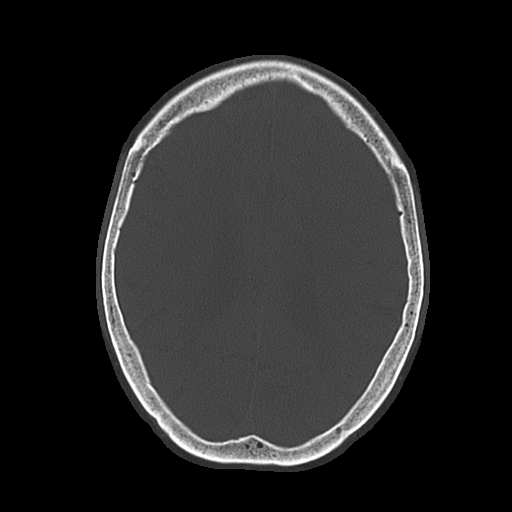
[im 41/51  bone]
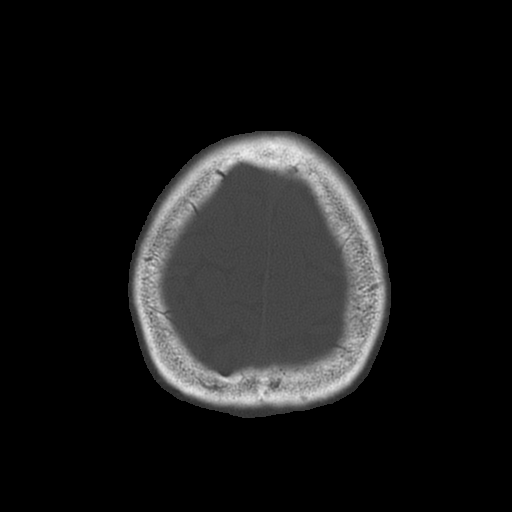

[Series 9: axial reformats · axial · 0.20mm/px · z∈[-309,-158]mm · 9 of 96 slices shown, 12 images]
[im 10/96  brain]
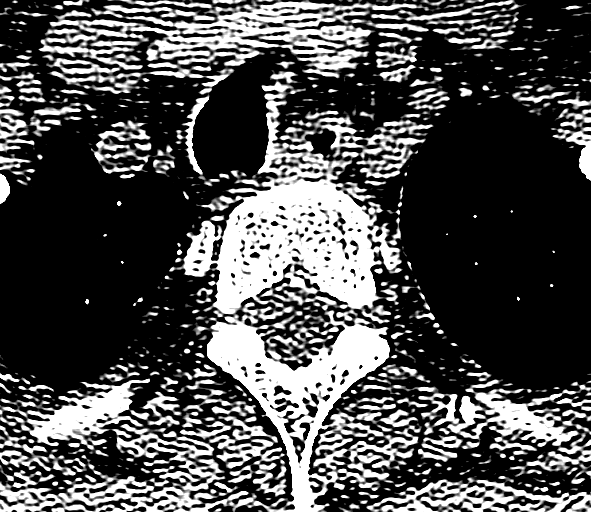
[im 10/96  bone]
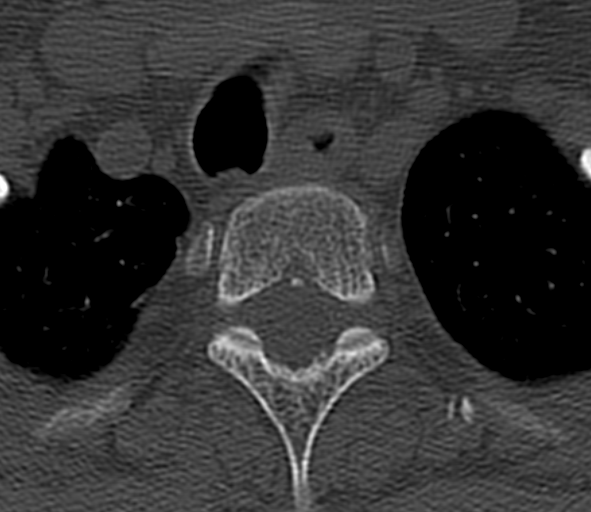
[im 20/96  brain]
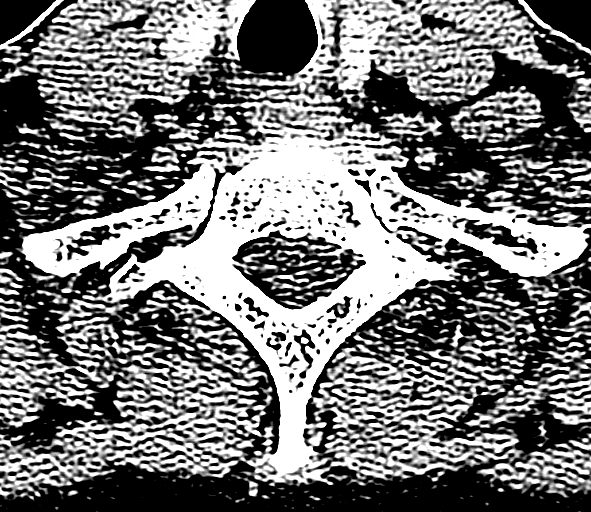
[im 29/96  brain]
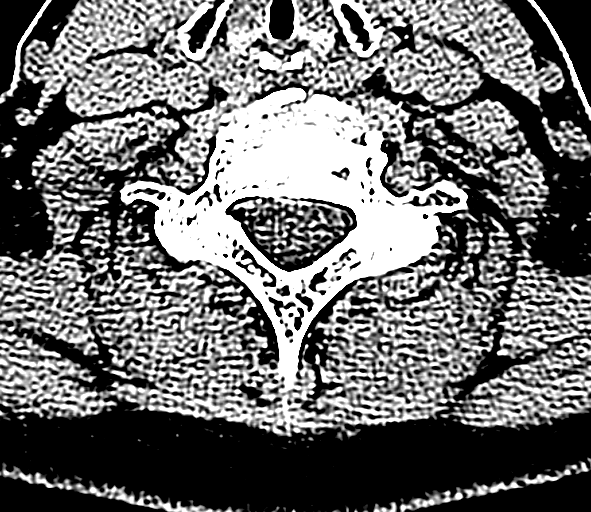
[im 39/96  brain]
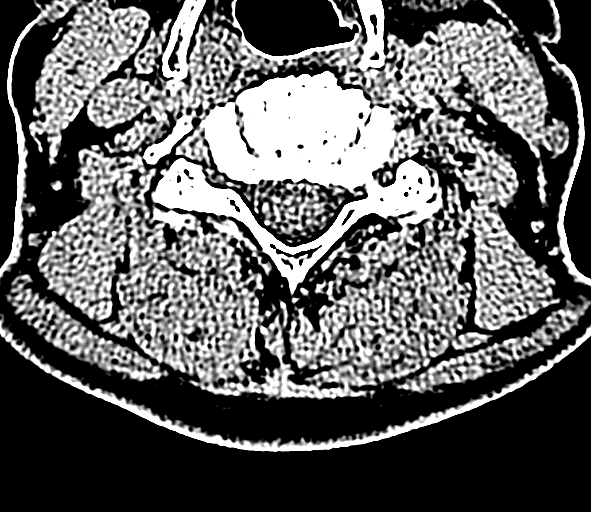
[im 48/96  brain]
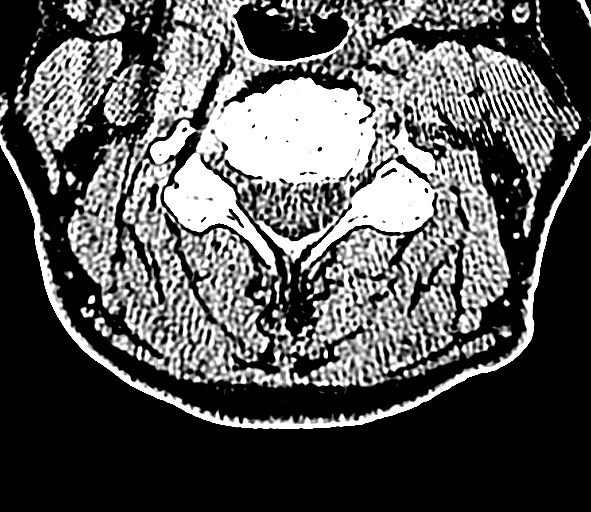
[im 48/96  bone]
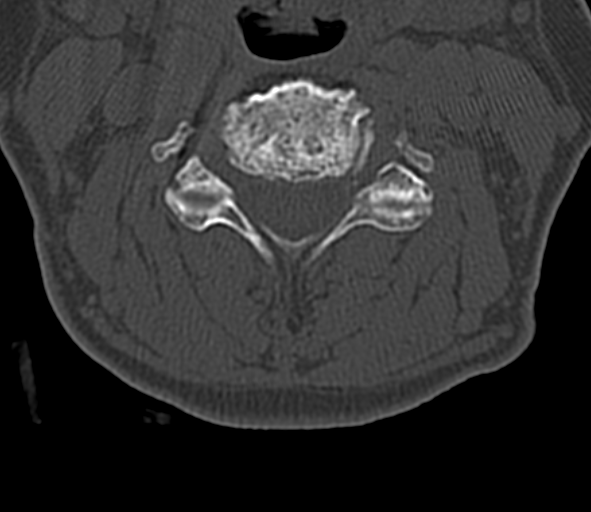
[im 58/96  brain]
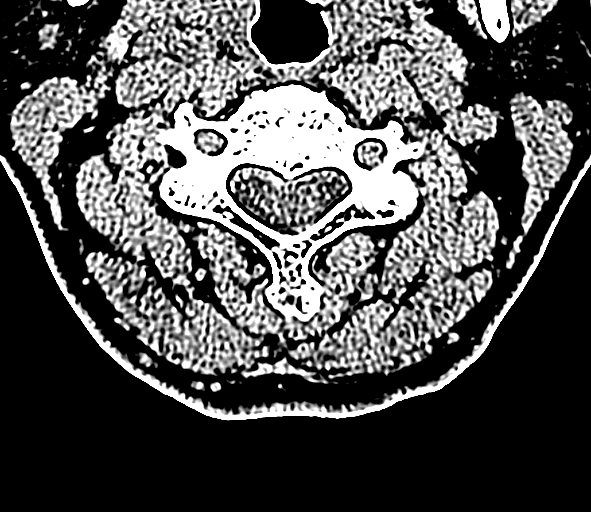
[im 67/96  brain]
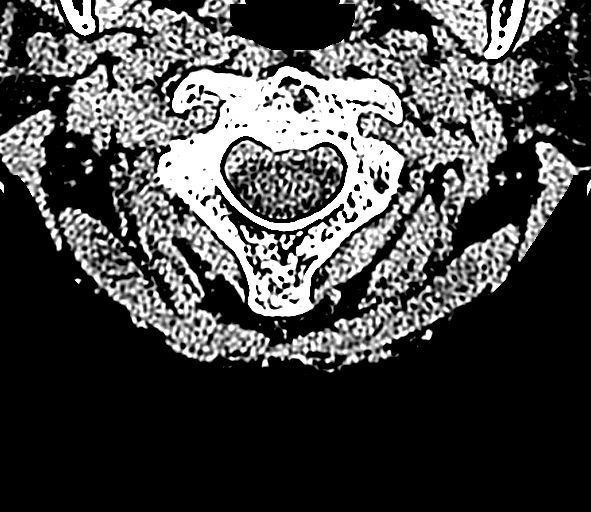
[im 77/96  brain]
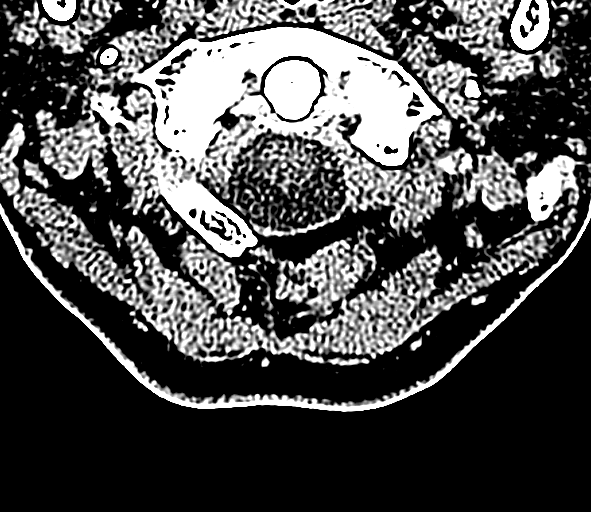
[im 86/96  brain]
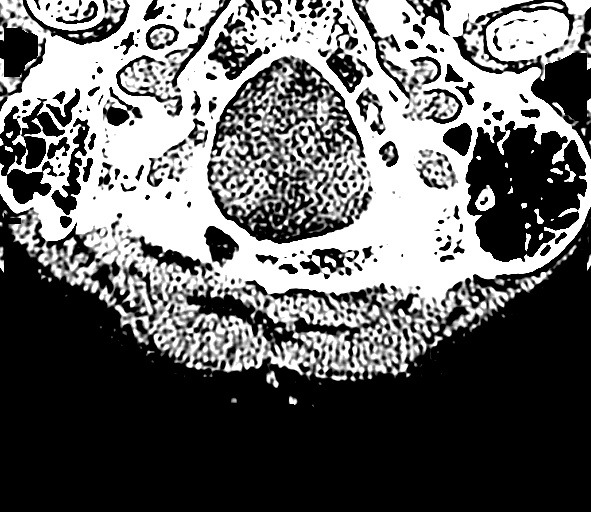
[im 86/96  bone]
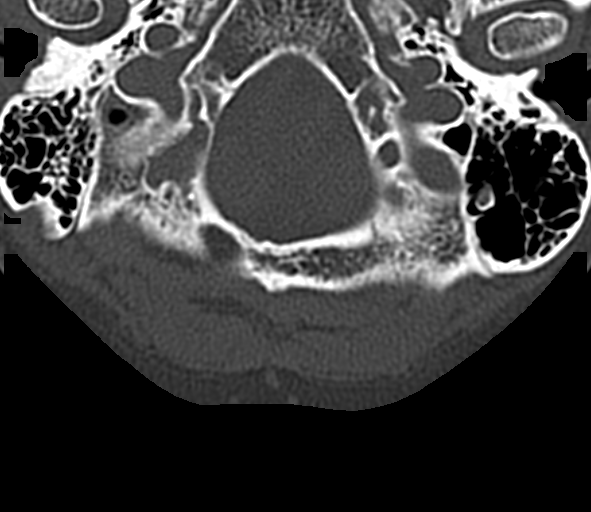

[13 of 30 positions shown; findings below may reference images not displayed]

FINDINGS: [Soft tissue swelling in the right frontal scalp
extending into the periorbital region.  Background of mild cerebral
and cerebellar atrophy.  Patchy and confluent areas of decreased
attenuation throughout the deep and periventricular white matter of
the cerebral hemispheres bilaterally, most compatible with chronic
microvascular ischemic disease. No acute displaced skull fractures
are identified.  No acute intracranial abnormality.  Specifically,
no evidence of acute post-traumatic intracranial hemorrhage, no
definite regions of acute/subacute cerebral ischemia, no focal
mass, mass effect, hydrocephalus or abnormal intra or extra-axial
fluid collections.  The visualized paranasal sinuses and mastoids
are well pneumatized.]
IMPRESSION: [1.  Soft tissue swelling in the right frontal scalp and
periorbital region without displaced skull fracture or acute
intracranial findings.
2.  Mild cerebral and cerebellar atrophy.
3.  Chronic microvascular ischemic changes throughout the deep and
periventricular white matter of the cerebral hemispheres
bilaterally.]

CT MAXILLOFACIAL
FINDINGS: Soft tissue swelling in the right frontal scalp
extending into the periorbital region.  The right orbit, globe and
no retrobulbar soft tissues are normal in appearance.  No acute
displaced facial bone fractures are identified. Mild mucosal
thickening in the right maxillary sinus.  No evidence of hemosinus.
Mandibular condyles are properly located bilaterally.
IMPRESSION: 1.  Soft tissue contusion in the right frontal scalp and
periorbital region without underlying displaced facial bone
fractures.

CT CERVICAL SPINE
FINDINGS: No acute displaced cervical spine fractures are
identified.  There is severe multilevel degenerative disc disease,
most pronounced at C4-C5, C5-C6 and C6-C7.  There is also
multilevel facet arthropathy.  These degenerative changes result in
some reversal of normal cervical lordosis centered at C3, which is
likely chronic.  No prevertebral soft tissue swelling to suggest
acute injury at this time.  Visualized portions of the upper thorax
are unremarkable.
IMPRESSION: 1.  No evidence of significant acute traumatic injury to the
cervical spine.
2.  Severe multilevel degenerative disc disease and cervical
spondylosis, as detailed above.

## 2012-01-09 IMAGING — CR DG CHEST 2V
2 series · 2 of 2 positions shown · non-contrast
Comparison: [DATE]

CLINICAL DATA: Passed out yesterday and fell.

CHEST - 2 VIEW

[w chest pa]
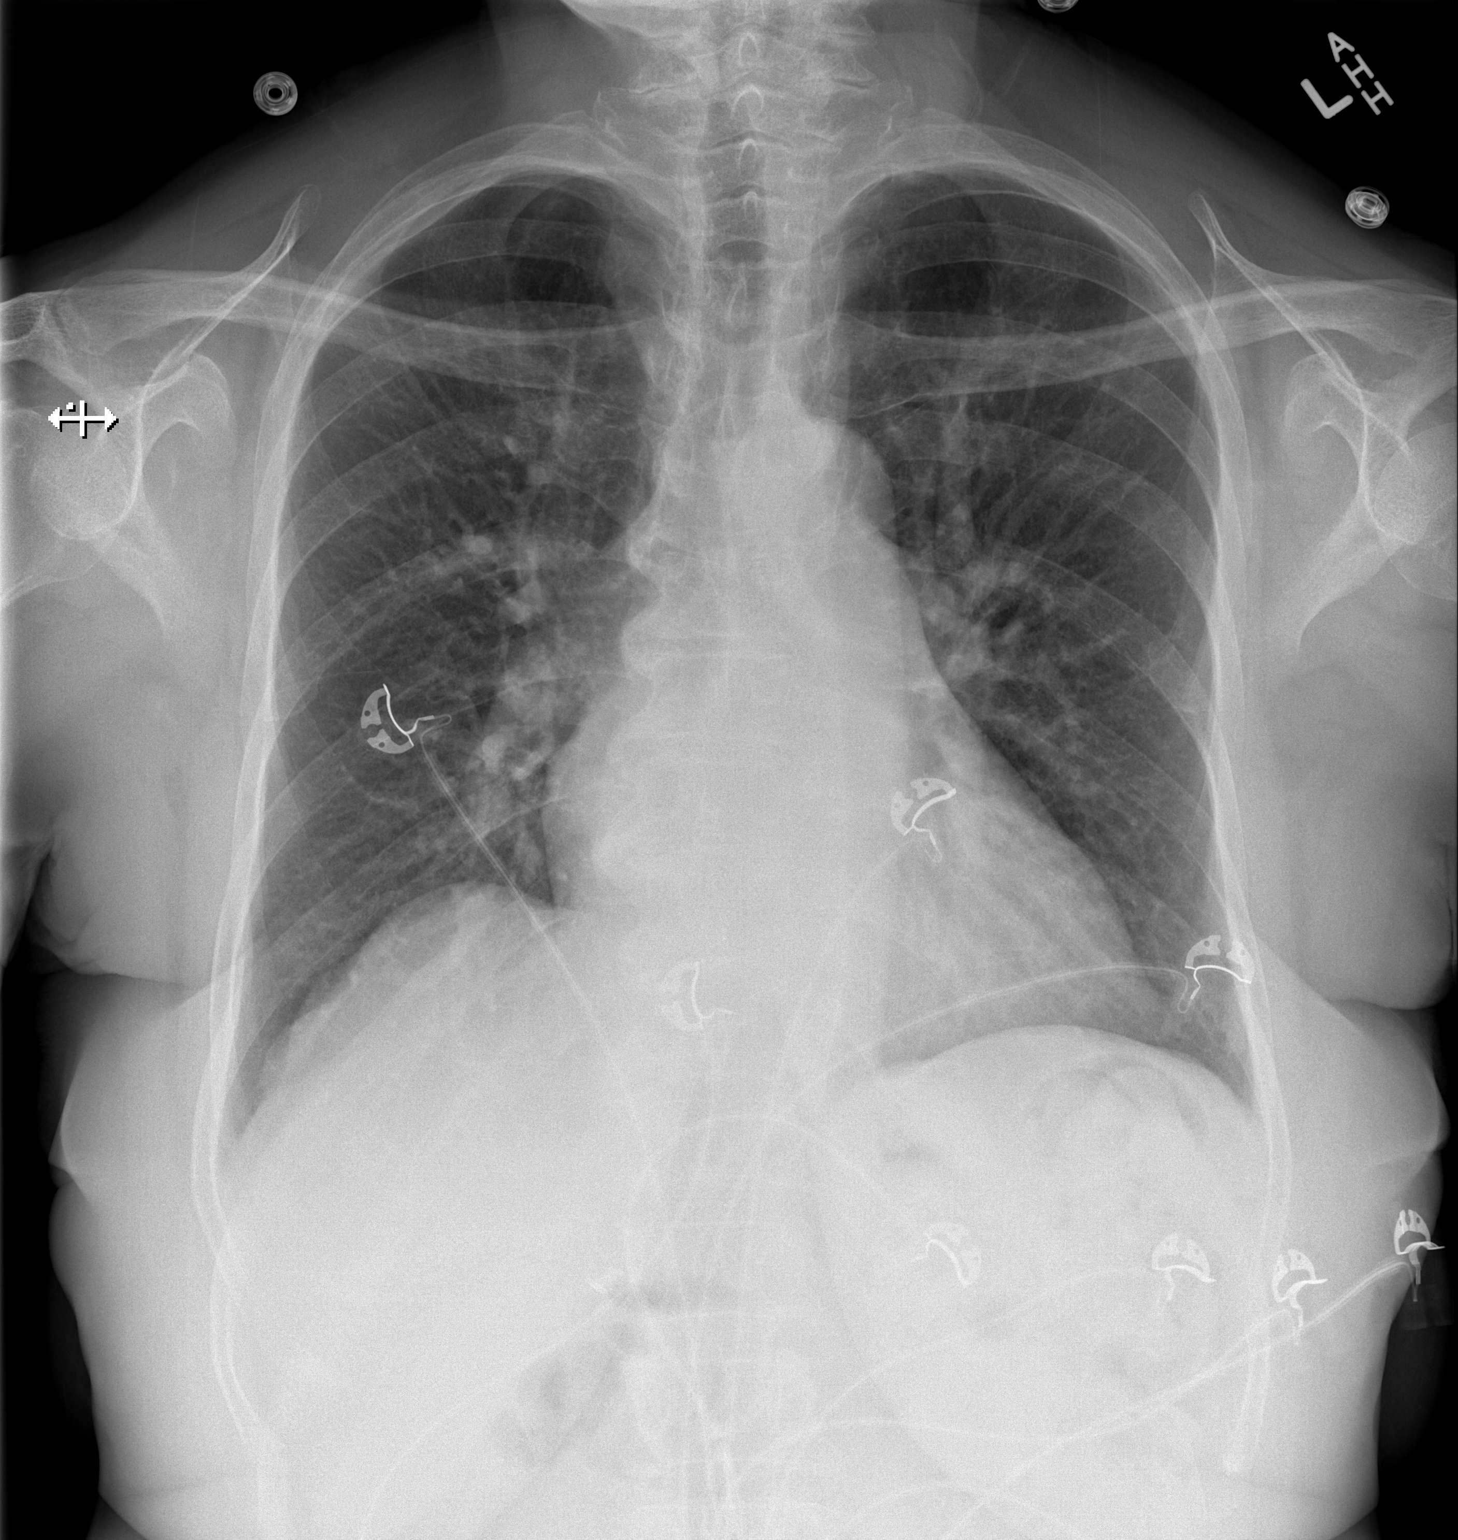

[w chest lat]
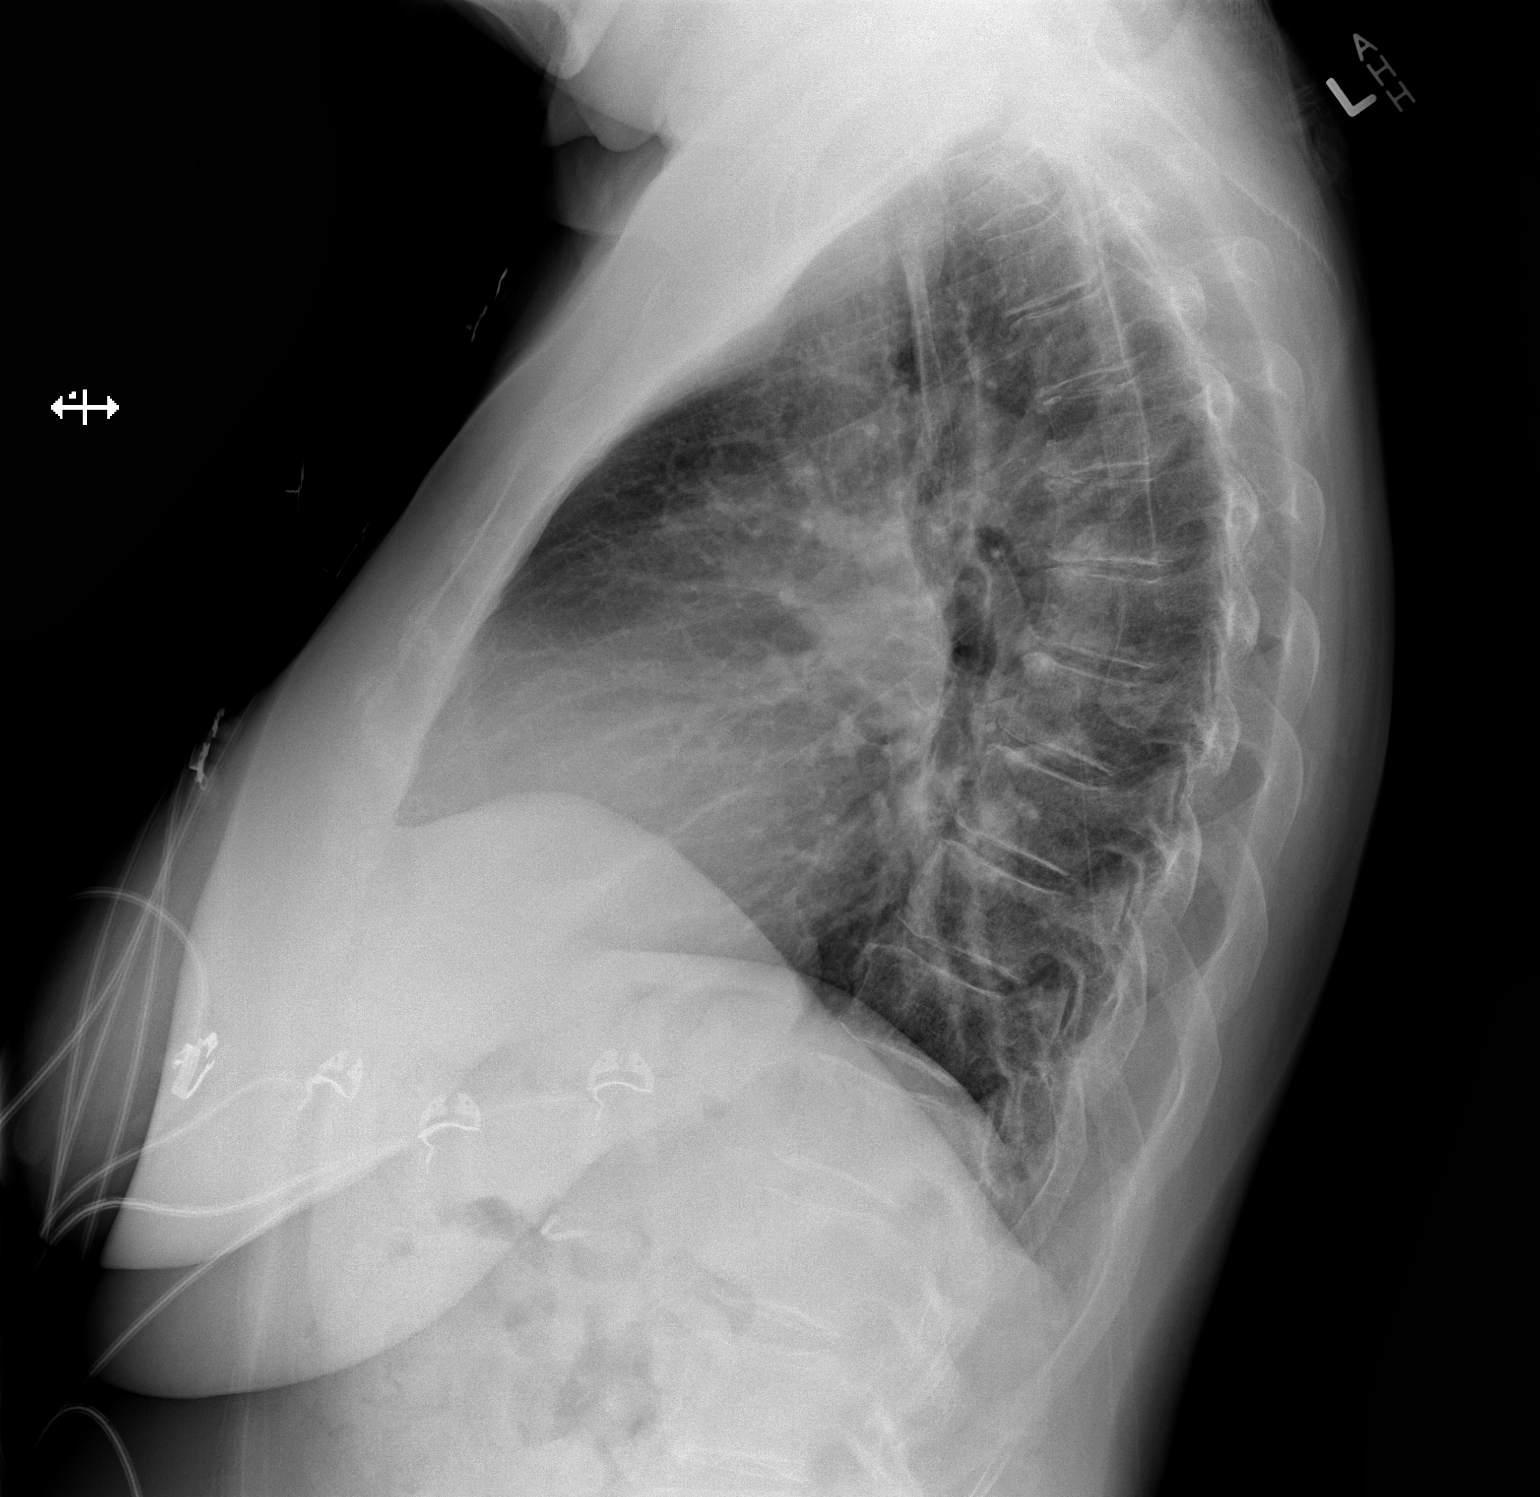

[2 of 2 positions shown; findings below may reference images not displayed]

FINDINGS: The cardiac silhouette is normal in size and
configuration.  No mediastinal or hilar masses or adenopathy.  The
lungs are clear.  The bony thorax is intact.
IMPRESSION: No active disease of the chest.

## 2012-01-09 MED ORDER — HYDROCODONE-ACETAMINOPHEN 5-325 MG PO TABS: ORAL_TABLET | ORAL | Status: DC

## 2012-01-09 NOTE — ED Provider Notes (Signed)
History     CSN: [id number]  Arrival date & time 01/09/12  4098   First MD Initiated Contact with Patient 01/09/12 1042      Chief Complaint  Patient presents with  . Fall  . syncopal episode     (Consider location/radiation/quality/duration/timing/severity/associated sxs/prior treatment) HPI  Patient reports she's been having passing out episodes for years but she has never told her doctor about it. She states it generally happens every 2-3 years. She states the time before this past time about 2 years ago  she had dizziness like she was going to pass out. She states 2 days ago she was walking through her living room and the next thing  she knew she was face down on the floor. She does not think she has a prolonged loss of consciousness but she does not know how long she is unconscious. She denies chest pain, shortness of breath, headache, or feeling her heart is skipping. She states in the past she has felt like her heart races at times and states she has a history of irregular heartbeat and that she used to be on medications but that was long time ago. She states she normally doesn't have a headache but today she has a headache from the bruising around her right eye from the fall. She denies any blurred vision. She states she has some tenderness in her right breast. She does not note any precipitating factors except that she has insomnia. Denies incontinence.    PCP Dr. Amador Cunas  Past Medical History  Diagnosis Date  . BACK PAIN 04/30/2008  . OSTEOPOROSIS 03/11/2007  . PLANTAR FASCIITIS 11/16/2006  . SHINGLES 09/27/2007  . SYMPTOMATIC MENOPAUSAL/FEMALE CLIMACTERIC STATES 06/20/2007  . Positive PPD     Past Surgical History  Procedure Date  . Cholecystectomy   . Abdominal hysterectomy   . Cataract extraction   . Sigmoidoscopy     No family history on file.  History  Substance Use Topics  . Smoking status: Never Smoker   . Smokeless tobacco: Never Used  . Alcohol Use: No    Lives at home Lives alone  OB History    Grav Para Term Preterm Abortions TAB SAB Ect Mult Living                  Review of Systems  All other systems reviewed and are negative.    Allergies  Penicillin g benzathine and Penicillins  Home Medications   Current Outpatient Rx  Name  Route  Sig  Dispense  Refill  . ASPIRIN 81 MG PO TABS   Oral   Take 81 mg by mouth daily.           Marland Kitchen CALCIUM CARBONATE-VITAMIN D 500-200 MG-UNIT PO TABS   Oral   Take 1 tablet by mouth daily.           Marland Kitchen FISH OIL 1000 MG PO CAPS   Oral   Take 1 capsule by mouth daily.            BP 123/85  Pulse 74  Temp 98.3 F (36.8 C) (Oral)  Resp 16  SpO2 100%  Vital signs normal   Orthostatic VS normal   Physical Exam  Nursing note and vitals reviewed. Constitutional: She is oriented to person, place, and time. She appears well-developed and well-nourished.  Non-toxic appearance. She does not appear ill. No distress.  HENT:  Head: Normocephalic.    Right Ear: External ear normal.  Left Ear: External  ear normal.  Nose: Nose normal. No mucosal edema or rhinorrhea.  Mouth/Throat: Oropharynx is clear and moist and mucous membranes are normal. No dental abscesses or uvula swelling.       Patient has marked swelling and bruising of her right upper and lower eyelids. She also has a linear bruising paralleling her eyebrow on her right forehead. She has tenderness to palpation of the inferior and superior orbital ram of the right eye. There is no loss of extraocular motion.  Eyes: Conjunctivae normal and EOM are normal. Pupils are equal, round, and reactive to light.       Pt has grossly intact VA, has mild injection of her slcera of the right eye, worse medially, no conjuntival hematoma, no hyphema  Neck: Normal range of motion and full passive range of motion without pain. Neck supple.  Cardiovascular: Normal rate, regular rhythm and normal heart sounds.  Exam reveals no gallop and no  friction rub.   No murmur heard. Pulmonary/Chest: Effort normal and breath sounds normal. No respiratory distress. She has no wheezes. She has no rhonchi. She has no rales. She exhibits no tenderness and no crepitus.  Abdominal: Soft. Normal appearance and bowel sounds are normal. She exhibits no distension. There is no tenderness. There is no rebound and no guarding.  Musculoskeletal: Normal range of motion. She exhibits no edema and no tenderness.       Moves all extremities well.   Neurological: She is alert and oriented to person, place, and time. She has normal strength. No cranial nerve deficit.  Skin: Skin is warm, dry and intact. No rash noted. No erythema. No pallor.  Psychiatric: She has a normal mood and affect. Her speech is normal and behavior is normal. Her mood appears not anxious.    ED Course  Procedures (including critical care time)   VA noted OD/OS/OU all 20/40  Pt had no ectopy on monitor in ED.    Results for orders placed during the hospital encounter of 01/09/12  CBC WITH DIFFERENTIAL      Component Value Range   WBC 7.8  4.0 - 10.5 K/uL   RBC 6.13 (*) 3.87 - 5.11 MIL/uL   Hemoglobin 15.5 (*) 12.0 - 15.0 g/dL   HCT 82.9 (*) 56.2 - 13.0 %   MCV 78.5  78.0 - 100.0 fL   MCH 25.3 (*) 26.0 - 34.0 pg   MCHC 32.2  30.0 - 36.0 g/dL   RDW 86.5  78.4 - 69.6 %   Platelets 230  150 - 400 K/uL   Neutrophils Relative 58  43 - 77 %   Neutro Abs 4.5  1.7 - 7.7 K/uL   Lymphocytes Relative 38  12 - 46 %   Lymphs Abs 2.9  0.7 - 4.0 K/uL   Monocytes Relative 4  3 - 12 %   Monocytes Absolute 0.3  0.1 - 1.0 K/uL   Eosinophils Relative 0  0 - 5 %   Eosinophils Absolute 0.0  0.0 - 0.7 K/uL   Basophils Relative 0  0 - 1 %   Basophils Absolute 0.0  0.0 - 0.1 K/uL  COMPREHENSIVE METABOLIC PANEL      Component Value Range   Sodium 140  135 - 145 mEq/L   Potassium 3.5  3.5 - 5.1 mEq/L   Chloride 103  96 - 112 mEq/L   CO2 28  19 - 32 mEq/L   Glucose, Bld 114 (*) 70 - 99  mg/dL   BUN 19  6 - 23 mg/dL   Creatinine, Ser 1.47  0.50 - 1.10 mg/dL   Calcium 9.6  8.4 - 82.9 mg/dL   Total Protein 7.0  6.0 - 8.3 g/dL   Albumin 3.4 (*) 3.5 - 5.2 g/dL   AST 22  0 - 37 U/L   ALT 12  0 - 35 U/L   Alkaline Phosphatase 53  39 - 117 U/L   Total Bilirubin 1.7 (*) 0.3 - 1.2 mg/dL   GFR calc non Af Amer 65 (*) >90 mL/min   GFR calc Af Amer 75 (*) >90 mL/min  TROPONIN I      Component Value Range   Troponin I <0.30  <0.30 ng/mL  URINALYSIS, ROUTINE W REFLEX MICROSCOPIC      Component Value Range   Color, Urine AMBER (*) YELLOW   APPearance CLOUDY (*) CLEAR   Specific Gravity, Urine 1.035 (*) 1.005 - 1.030   pH 5.5  5.0 - 8.0   Glucose, UA NEGATIVE  NEGATIVE mg/dL   Hgb urine dipstick NEGATIVE  NEGATIVE   Bilirubin Urine SMALL (*) NEGATIVE   Ketones, ur TRACE (*) NEGATIVE mg/dL   Protein, ur NEGATIVE  NEGATIVE mg/dL   Urobilinogen, UA 1.0  0.0 - 1.0 mg/dL   Nitrite NEGATIVE  NEGATIVE   Leukocytes, UA SMALL (*) NEGATIVE  MAGNESIUM      Component Value Range   Magnesium 2.3  1.5 - 2.5 mg/dL  URINE MICROSCOPIC-ADD ON      Component Value Range   Squamous Epithelial / LPF RARE  RARE   WBC, UA 0-2  <3 WBC/hpf   Bacteria, UA RARE  RARE   Urine-Other MUCOUS PRESENT     Laboratory interpretation all normal except concentrated Hb (mild)   Dg Chest 2 View  01/09/2012  *RADIOLOGY REPORT*  Clinical Data: Passed out yesterday and fell.  CHEST - 2 VIEW  Comparison: 05/29/2009  Findings: The cardiac silhouette is normal in size and configuration.  No mediastinal or hilar masses or adenopathy.  The lungs are clear.  The bony thorax is intact.  IMPRESSION: No active disease of the chest.   Original Report Authenticated By: Amie Portland, M.D.    Ct Head Wo Contrast  Ct Cervical Spine Wo Contrast  Ct Maxillofacial Wo Cm  01/09/2012  *RADIOLOGY REPORT*  Clinical Data:  History of syncope with injury to the head and face.  CT HEAD WITHOUT CONTRAST CT MAXILLOFACIAL WITHOUT  CONTRAST CT CERVICAL SPINE WITHOUT CONTRAST  Technique:  Multidetector CT imaging of the head, cervical spine, and maxillofacial structures were performed using the standard protocol without intravenous contrast. Multiplanar CT image reconstructions of the cervical spine and maxillofacial structures were also generated.  Comparison:  Head CT 04/28/2008.  CT HEAD  Findings: Soft tissue swelling in the right frontal scalp extending into the periorbital region.  Background of mild cerebral and cerebellar atrophy.  Patchy and confluent areas of decreased attenuation throughout the deep and periventricular white matter of the cerebral hemispheres bilaterally, most compatible with chronic microvascular ischemic disease. No acute displaced skull fractures are identified.  No acute intracranial abnormality.  Specifically, no evidence of acute post-traumatic intracranial hemorrhage, no definite regions of acute/subacute cerebral ischemia, no focal mass, mass effect, hydrocephalus or abnormal intra or extra-axial fluid collections.  The visualized paranasal sinuses and mastoids are well pneumatized.  IMPRESSION:  1.  Soft tissue swelling in the right frontal scalp and periorbital region without displaced skull fracture or acute intracranial findings. 2.  Mild cerebral  and cerebellar atrophy. 3.  Chronic microvascular ischemic changes throughout the deep and periventricular white matter of the cerebral hemispheres bilaterally.  CT MAXILLOFACIAL  Findings:  Soft tissue swelling in the right frontal scalp extending into the periorbital region.  The right orbit, globe and no retrobulbar soft tissues are normal in appearance.  No acute displaced facial bone fractures are identified. Mild mucosal thickening in the right maxillary sinus.  No evidence of hemosinus. Mandibular condyles are properly located bilaterally.  IMPRESSION: 1.  Soft tissue contusion in the right frontal scalp and periorbital region without underlying displaced  facial bone fractures.  CT CERVICAL SPINE  Findings:   No acute displaced cervical spine fractures are identified.  There is severe multilevel degenerative disc disease, most pronounced at C4-C5, C5-C6 and C6-C7.  There is also multilevel facet arthropathy.  These degenerative changes result in some reversal of normal cervical lordosis centered at C3, which is likely chronic.  No prevertebral soft tissue swelling to suggest acute injury at this time.  Visualized portions of the upper thorax are unremarkable.  IMPRESSION: 1.  No evidence of significant acute traumatic injury to the cervical spine. 2.  Severe multilevel degenerative disc disease and cervical spondylosis, as detailed above.   Original Report Authenticated By: Trudie Reed, M.D.      Date: 01/09/2012  Rate: 66  Rhythm: normal sinus rhythm  QRS Axis: normal  Intervals: normal  ST/T Wave abnormalities: normal  Conduction Disutrbances:left anterior fascicular block  Narrative Interpretation:   Old EKG Reviewed: unchanged from 05/30/2009    1. Syncope   2. Contusion, eyelid, right    New Prescriptions   HYDROCODONE-ACETAMINOPHEN (NORCO/VICODIN) 5-325 MG PER TABLET    Take 1 or 2 po Q 6hrs for pain    Plan discharge  Devoria Albe, MD, FACEP    MDM           Ward Givens, MD 01/09/12 1314

## 2012-01-09 NOTE — ED Notes (Signed)
Patient transported to X-ray 

## 2012-01-09 NOTE — ED Notes (Addendum)
States fell sometime yesterday, does not remember fall- but then states felt bad all day and was afraid was going to fall again so she stayed in bed all day. , states "must have blacked out, because I woke up on the floor." right eye swollen, ecchymotic, bruising noted above eye. No other bruising on face or head noted. States hurts around right rib area to move also. States was not incontinent. Speech clear, ambulated without difficulty

## 2012-01-11 ENCOUNTER — Telehealth: Payer: Self-pay | Admitting: Internal Medicine

## 2012-01-11 NOTE — Telephone Encounter (Signed)
Please advise as to when pt needs to be seen. Thank you

## 2012-01-11 NOTE — Telephone Encounter (Signed)
I have tried several times to call and I keep getting a busy signal. Will continue to try.

## 2012-01-11 NOTE — Telephone Encounter (Signed)
Pt is calling to schedule an appt per [facility] ED instructions to f/u today and to not drive until she has been seen by Dr. Kirtland Bouchard. Pt had another syncope episode on sat night (01/09/12) and was treated for an large contusion over her eye. CT was negative. See ED notes in Epic. Rn checked there are no appts available with Dr. Kirtland Bouchard today. Office please call pt back. [phone]

## 2012-01-11 NOTE — Telephone Encounter (Signed)
Ok for [employer] or [employer]

## 2012-01-12 ENCOUNTER — Encounter: Payer: Self-pay | Admitting: Internal Medicine

## 2012-01-12 ENCOUNTER — Ambulatory Visit (INDEPENDENT_AMBULATORY_CARE_PROVIDER_SITE_OTHER): Payer: Medicare Other | Admitting: Internal Medicine

## 2012-01-12 VITALS — BP 140/76 | HR 72 | Temp 98.0°F | Resp 18 | Wt 182.0 lb

## 2012-01-12 DIAGNOSIS — R55 Syncope and collapse: Secondary | ICD-10-CM

## 2012-01-12 NOTE — Progress Notes (Signed)
Subjective:    Patient ID: Nicole Blackwell, female    DOB: 1935-11-29, 76 y.o.   MRN: [id number]  HPI  76 year old patient who was evaluated in the emergency room recently after a syncopal episode. This occurred 3 days ago and was associated with significant facial trauma. ED evaluation was unremarkable except for soft tissue injury involving the face a chest x-ray was performed that revealed normal size heart and no active chest disease.  EKG revealed a normal sinus rhythm with a left anterior hemiblock poor R wave progression and nonspecific ST-T wave changes.  head CT unremarkable The patient states that she is had 3 syncopal episodes over the past 5 or 6 years. Only one episode was witnessed.  Loss of consciousness is apparently abrupt without warning and there has been no associated incontinence or confusion upon arousal. She states that a number of years ago, possibly over 20, she was treated for a rapid irregular rhythm with what she believes was Lanoxin. Patient denies any active cardiopulmonary complaints  Past Medical History  Diagnosis Date  . BACK PAIN 04/30/2008  . OSTEOPOROSIS 03/11/2007  . PLANTAR FASCIITIS 11/16/2006  . SHINGLES 09/27/2007  . SYMPTOMATIC MENOPAUSAL/FEMALE CLIMACTERIC STATES 06/20/2007  . Positive PPD     History   Social History  . Marital Status: Divorced    Spouse Name: N/A    Number of Children: N/A  . Years of Education: N/A   Occupational History  . Not on file.   Social History Main Topics  . Smoking status: Never Smoker   . Smokeless tobacco: Never Used  . Alcohol Use: No  . Drug Use: No  . Sexually Active: Not on file   Other Topics Concern  . Not on file   Social History Narrative  . No narrative on file    Past Surgical History  Procedure Date  . Cholecystectomy   . Abdominal hysterectomy   . Cataract extraction   . Sigmoidoscopy     No family history on file.  Allergies  Allergen Reactions  . Penicillin G Benzathine Swelling    . Penicillins Swelling    Current Outpatient Prescriptions on File Prior to Visit  Medication Sig Dispense Refill  . aspirin 81 MG tablet Take 81 mg by mouth daily.        . calcium-vitamin D (OSCAL WITH D) 500-200 MG-UNIT per tablet Take 1 tablet by mouth daily.        Marland Kitchen HYDROcodone-acetaminophen (NORCO/VICODIN) 5-325 MG per tablet Take 1 or 2 po Q 6hrs for pain  12 tablet  0  . Omega-3 Fatty Acids (FISH OIL) 1000 MG CAPS Take 1 capsule by mouth daily.         BP 140/76  Pulse 72  Temp 98 F (36.7 C) (Oral)  Resp 18  Wt 182 lb (82.555 kg)       Review of Systems  Constitutional: Negative.   HENT: Negative for hearing loss, congestion, sore throat, rhinorrhea, dental problem, sinus pressure and tinnitus.   Eyes: Negative for pain, discharge and visual disturbance.  Respiratory: Negative for cough and shortness of breath.   Cardiovascular: Negative for chest pain, palpitations and leg swelling.  Gastrointestinal: Negative for nausea, vomiting, abdominal pain, diarrhea, constipation, blood in stool and abdominal distention.  Genitourinary: Negative for dysuria, urgency, frequency, hematuria, flank pain, vaginal bleeding, vaginal discharge, difficulty urinating, vaginal pain and pelvic pain.  Musculoskeletal: Negative for joint swelling, arthralgias and gait problem.  Skin: Negative for rash.  Neurological: Positive  for syncope and headaches. Negative for dizziness, speech difficulty, weakness and numbness.  Hematological: Negative for adenopathy.  Psychiatric/Behavioral: Negative for behavioral problems, dysphoric mood and agitation. The patient is not nervous/anxious.        Objective:   Physical Exam  Constitutional: She is oriented to person, place, and time. She appears well-developed and well-nourished.       Blood pressure normal without orthostatic changes  HENT:  Head: Normocephalic.  Right Ear: External ear normal.  Left Ear: External ear normal.  Mouth/Throat:  Oropharynx is clear and moist.       Considerable right periorbital edema and ecchymosis Soft tissue swelling also in the right temporal area  Eyes: Conjunctivae normal and EOM are normal. Pupils are equal, round, and reactive to light.  Neck: Normal range of motion. Neck supple. No thyromegaly present.  Cardiovascular: Normal rate, regular rhythm, normal heart sounds and intact distal pulses.   Pulmonary/Chest: Effort normal and breath sounds normal.  Abdominal: Soft. Bowel sounds are normal. She exhibits no mass. There is no tenderness.  Musculoskeletal: Normal range of motion.  Lymphadenopathy:    She has no cervical adenopathy.  Neurological: She is alert and oriented to person, place, and time. No cranial nerve deficit.  Skin: Skin is warm and dry. No rash noted.  Psychiatric: She has a normal mood and affect. Her behavior is normal.          Assessment & Plan:   Recurrent syncope History of paroxysmal atrial fibrillation?  We'll proceed with 2-D echo carotid duplex event monitoring and EEG Recheck 1 month

## 2012-01-12 NOTE — Patient Instructions (Signed)
Return in one month for follow-up  

## 2012-01-13 ENCOUNTER — Encounter: Payer: Self-pay | Admitting: Internal Medicine

## 2012-01-13 ENCOUNTER — Telehealth: Payer: Self-pay | Admitting: Internal Medicine

## 2012-01-13 NOTE — Telephone Encounter (Signed)
Patient called stating that she would like advise on when she will be able to drive again. Please advise.

## 2012-01-13 NOTE — Telephone Encounter (Signed)
Recommended patient restricts for a least minimize driving until evaluation complete

## 2012-01-13 NOTE — Telephone Encounter (Signed)
Patient informed of advise

## 2012-01-18 ENCOUNTER — Encounter (INDEPENDENT_AMBULATORY_CARE_PROVIDER_SITE_OTHER): Payer: Medicare Other

## 2012-01-18 DIAGNOSIS — R55 Syncope and collapse: Secondary | ICD-10-CM

## 2012-01-20 ENCOUNTER — Ambulatory Visit (HOSPITAL_COMMUNITY): Payer: Medicare Other | Attending: Cardiovascular Disease

## 2012-01-20 DIAGNOSIS — I379 Nonrheumatic pulmonary valve disorder, unspecified: Secondary | ICD-10-CM | POA: Insufficient documentation

## 2012-01-20 DIAGNOSIS — I369 Nonrheumatic tricuspid valve disorder, unspecified: Secondary | ICD-10-CM | POA: Insufficient documentation

## 2012-01-20 DIAGNOSIS — R55 Syncope and collapse: Secondary | ICD-10-CM | POA: Insufficient documentation

## 2012-01-20 NOTE — Progress Notes (Signed)
Echocardiogram performed.  

## 2012-01-21 ENCOUNTER — Ambulatory Visit (HOSPITAL_COMMUNITY): Payer: Medicare Other

## 2012-01-25 ENCOUNTER — Encounter (INDEPENDENT_AMBULATORY_CARE_PROVIDER_SITE_OTHER): Payer: Medicare Other

## 2012-01-25 DIAGNOSIS — R55 Syncope and collapse: Secondary | ICD-10-CM

## 2012-01-26 ENCOUNTER — Ambulatory Visit (HOSPITAL_COMMUNITY)
Admission: RE | Admit: 2012-01-26 | Discharge: 2012-01-26 | Disposition: A | Discharge status: Home / Self Care | Payer: Medicare Other | Source: Ambulatory Visit | Attending: Internal Medicine | Admitting: Internal Medicine

## 2012-01-26 DIAGNOSIS — R55 Syncope and collapse: Secondary | ICD-10-CM

## 2012-01-26 NOTE — Procedures (Signed)
History: 76 yo F with syncope  Sedation: none  Background: There is a well defined posterior dominant rhythm of 9 Hz that attenuates with eye opening. There is mild slow activity associated with hyperventilation. There is no sleep recorded.   Photic stimulation: Physiologic driving is present  EEG Diagnosis: 1) Normal waking EEG  Clinical Interpretation: This normal EEG is recorded in the waking state. There was no seizure or seizure predisposition recorded on this study.   Ritta Slot, MD [facility] [phone]  If 7pm- 7am, please page neurology on call at [phone].

## 2012-01-26 NOTE — Progress Notes (Signed)
EEG completed as outpatient EEG as ordered

## 2012-02-11 ENCOUNTER — Encounter: Payer: Self-pay | Admitting: Internal Medicine

## 2012-02-11 ENCOUNTER — Ambulatory Visit (INDEPENDENT_AMBULATORY_CARE_PROVIDER_SITE_OTHER): Payer: Medicare Other | Admitting: Internal Medicine

## 2012-02-11 VITALS — BP 130/80 | HR 90 | Temp 97.7°F | Resp 20 | Wt 182.0 lb

## 2012-02-11 DIAGNOSIS — R55 Syncope and collapse: Secondary | ICD-10-CM

## 2012-02-11 NOTE — Progress Notes (Signed)
Subjective:    Patient ID: Nicole Blackwell, female    DOB: Mar 06, 1935, 76 y.o.   MRN: [id number]  HPI  76 year old patient who is seen today for followup of syncope. She has had no recurrent symptoms. Since her last visit here she has had a 2-D echocardiogram carotid artery duplex examination and EEG all of which were unremarkable. She is on her final week of a event monitor. Asymptomatic  Past Medical History  Diagnosis Date  . BACK PAIN 04/30/2008  . OSTEOPOROSIS 03/11/2007  . PLANTAR FASCIITIS 11/16/2006  . SHINGLES 09/27/2007  . SYMPTOMATIC MENOPAUSAL/FEMALE CLIMACTERIC STATES 06/20/2007  . Positive PPD     History   Social History  . Marital Status: Divorced    Spouse Name: N/A    Number of Children: N/A  . Years of Education: N/A   Occupational History  . Not on file.   Social History Main Topics  . Smoking status: Never Smoker   . Smokeless tobacco: Never Used  . Alcohol Use: No  . Drug Use: No  . Sexually Active: Not on file   Other Topics Concern  . Not on file   Social History Narrative  . No narrative on file    Past Surgical History  Procedure Date  . Cholecystectomy   . Abdominal hysterectomy   . Cataract extraction   . Sigmoidoscopy     No family history on file.  Allergies  Allergen Reactions  . Penicillin G Benzathine Swelling  . Penicillins Swelling    Current Outpatient Prescriptions on File Prior to Visit  Medication Sig Dispense Refill  . aspirin 81 MG tablet Take 81 mg by mouth daily.        . calcium-vitamin D (OSCAL WITH D) 500-200 MG-UNIT per tablet Take 1 tablet by mouth daily.        Marland Kitchen HYDROcodone-acetaminophen (NORCO/VICODIN) 5-325 MG per tablet Take 1 or 2 po Q 6hrs for pain  12 tablet  0  . Omega-3 Fatty Acids (FISH OIL) 1000 MG CAPS Take 1 capsule by mouth daily.         BP 130/80  Pulse 90  Temp 97.7 F (36.5 C) (Oral)  Resp 20  Wt 182 lb (82.555 kg)  SpO2 99%       Review of Systems  Constitutional: Negative.    HENT: Negative for hearing loss, congestion, sore throat, rhinorrhea, dental problem, sinus pressure and tinnitus.   Eyes: Negative for pain, discharge and visual disturbance.  Respiratory: Negative for cough and shortness of breath.   Cardiovascular: Negative for chest pain, palpitations and leg swelling.  Gastrointestinal: Negative for nausea, vomiting, abdominal pain, diarrhea, constipation, blood in stool and abdominal distention.  Genitourinary: Negative for dysuria, urgency, frequency, hematuria, flank pain, vaginal bleeding, vaginal discharge, difficulty urinating, vaginal pain and pelvic pain.  Musculoskeletal: Negative for joint swelling, arthralgias and gait problem.  Skin: Negative for rash.  Neurological: Negative for dizziness, syncope, speech difficulty, weakness, numbness and headaches.  Hematological: Negative for adenopathy.  Psychiatric/Behavioral: Negative for behavioral problems, dysphoric mood and agitation. The patient is not nervous/anxious.        Objective:   Physical Exam  Constitutional: She is oriented to person, place, and time. She appears well-developed and well-nourished.  HENT:  Head: Normocephalic.  Right Ear: External ear normal.  Left Ear: External ear normal.  Mouth/Throat: Oropharynx is clear and moist.  Eyes: Conjunctivae normal and EOM are normal. Pupils are equal, round, and reactive to light.  Neck: Normal range  of motion. Neck supple. No thyromegaly present.  Cardiovascular: Normal rate, regular rhythm, normal heart sounds and intact distal pulses.   Pulmonary/Chest: Effort normal and breath sounds normal.  Abdominal: Soft. Bowel sounds are normal. She exhibits no mass. There is no tenderness.  Musculoskeletal: Normal range of motion.  Lymphadenopathy:    She has no cervical adenopathy.  Neurological: She is alert and oriented to person, place, and time.  Skin: Skin is warm and dry. No rash noted.  Psychiatric: She has a normal mood and  affect. Her behavior is normal.          Assessment & Plan:   History of syncope. Evaluation unremarkable to date. Will continue with the event monitor for her final week. Will call if she develops any recurrent symptoms. Continue baby aspirin daily return when necessary or in one year for an annual exam

## 2012-02-11 NOTE — Patient Instructions (Signed)
Return in one year for follow-up    It is important that you exercise regularly, at least 20 minutes 3 to 4 times per week.  If you develop chest pain or shortness of breath seek  medical attention.

## 2012-06-30 ENCOUNTER — Encounter (HOSPITAL_COMMUNITY): Payer: Self-pay | Admitting: *Deleted

## 2012-06-30 ENCOUNTER — Emergency Department (HOSPITAL_COMMUNITY): Payer: Medicare Other

## 2012-06-30 ENCOUNTER — Emergency Department (HOSPITAL_COMMUNITY)
Admission: EM | Admit: 2012-06-30 | Discharge: 2012-06-30 | Disposition: A | Discharge status: Home / Self Care | Payer: Medicare Other | Attending: Emergency Medicine | Admitting: Emergency Medicine

## 2012-06-30 DIAGNOSIS — Z8739 Personal history of other diseases of the musculoskeletal system and connective tissue: Secondary | ICD-10-CM | POA: Insufficient documentation

## 2012-06-30 DIAGNOSIS — Z8742 Personal history of other diseases of the female genital tract: Secondary | ICD-10-CM | POA: Insufficient documentation

## 2012-06-30 DIAGNOSIS — Z8619 Personal history of other infectious and parasitic diseases: Secondary | ICD-10-CM | POA: Insufficient documentation

## 2012-06-30 DIAGNOSIS — Z880 Allergy status to penicillin: Secondary | ICD-10-CM | POA: Insufficient documentation

## 2012-06-30 DIAGNOSIS — R824 Acetonuria: Secondary | ICD-10-CM | POA: Insufficient documentation

## 2012-06-30 DIAGNOSIS — R55 Syncope and collapse: Secondary | ICD-10-CM | POA: Insufficient documentation

## 2012-06-30 DIAGNOSIS — Z7982 Long term (current) use of aspirin: Secondary | ICD-10-CM | POA: Insufficient documentation

## 2012-06-30 DIAGNOSIS — M810 Age-related osteoporosis without current pathological fracture: Secondary | ICD-10-CM | POA: Insufficient documentation

## 2012-06-30 LAB — COMPREHENSIVE METABOLIC PANEL
ALT: 13 U/L (ref 0–35)
AST: 25 U/L (ref 0–37)
Albumin: 3.5 g/dL (ref 3.5–5.2)
CO2: 28 mEq/L (ref 19–32)
Chloride: 102 mEq/L (ref 96–112)
Creatinine, Ser: 0.91 mg/dL (ref 0.50–1.10)
Potassium: 4 mEq/L (ref 3.5–5.1)
Sodium: 138 mEq/L (ref 135–145)
Total Bilirubin: 1.1 mg/dL (ref 0.3–1.2)

## 2012-06-30 LAB — URINALYSIS, ROUTINE W REFLEX MICROSCOPIC
Bilirubin Urine: NEGATIVE
Glucose, UA: NEGATIVE mg/dL
Hgb urine dipstick: NEGATIVE
Protein, ur: NEGATIVE mg/dL

## 2012-06-30 LAB — URINE MICROSCOPIC-ADD ON

## 2012-06-30 LAB — CBC WITH DIFFERENTIAL/PLATELET
Basophils Absolute: 0 10*3/uL (ref 0.0–0.1)
Basophils Relative: 0 % (ref 0–1)
Lymphocytes Relative: 36 % (ref 12–46)
MCHC: 32.8 g/dL (ref 30.0–36.0)
Monocytes Absolute: 0.4 10*3/uL (ref 0.1–1.0)
Neutro Abs: 4.1 10*3/uL (ref 1.7–7.7)
Neutrophils Relative %: 57 % (ref 43–77)
Platelets: 205 10*3/uL (ref 150–400)
RDW: 15.4 % (ref 11.5–15.5)
WBC: 7.2 10*3/uL (ref 4.0–10.5)

## 2012-06-30 IMAGING — CR DG CHEST 2V
2 series · 2 of 2 positions shown · non-contrast
Comparison: [DATE]

CLINICAL DATA: Discomfort, overheated at a humeral

CHEST - 2 VIEW

[w chest pa]
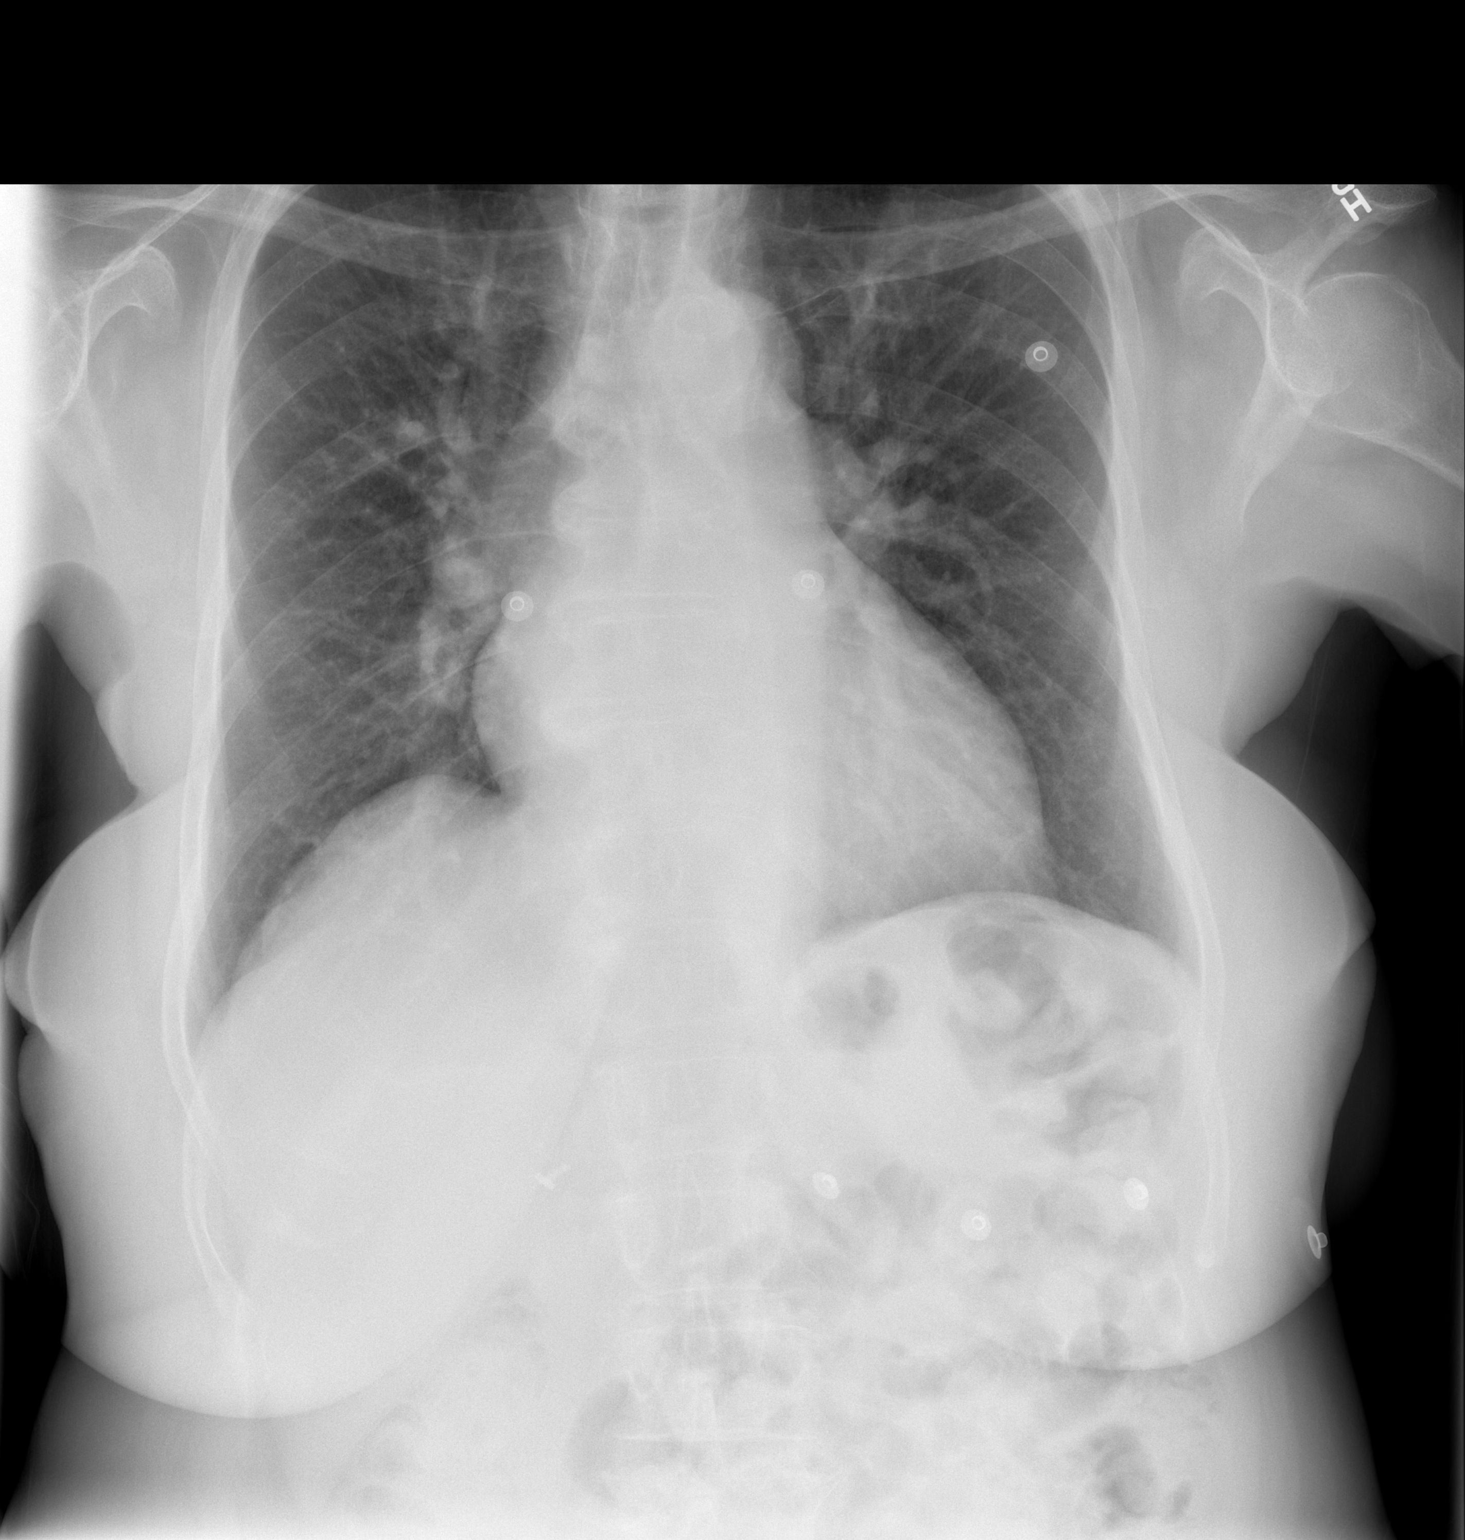

[w chest lat]
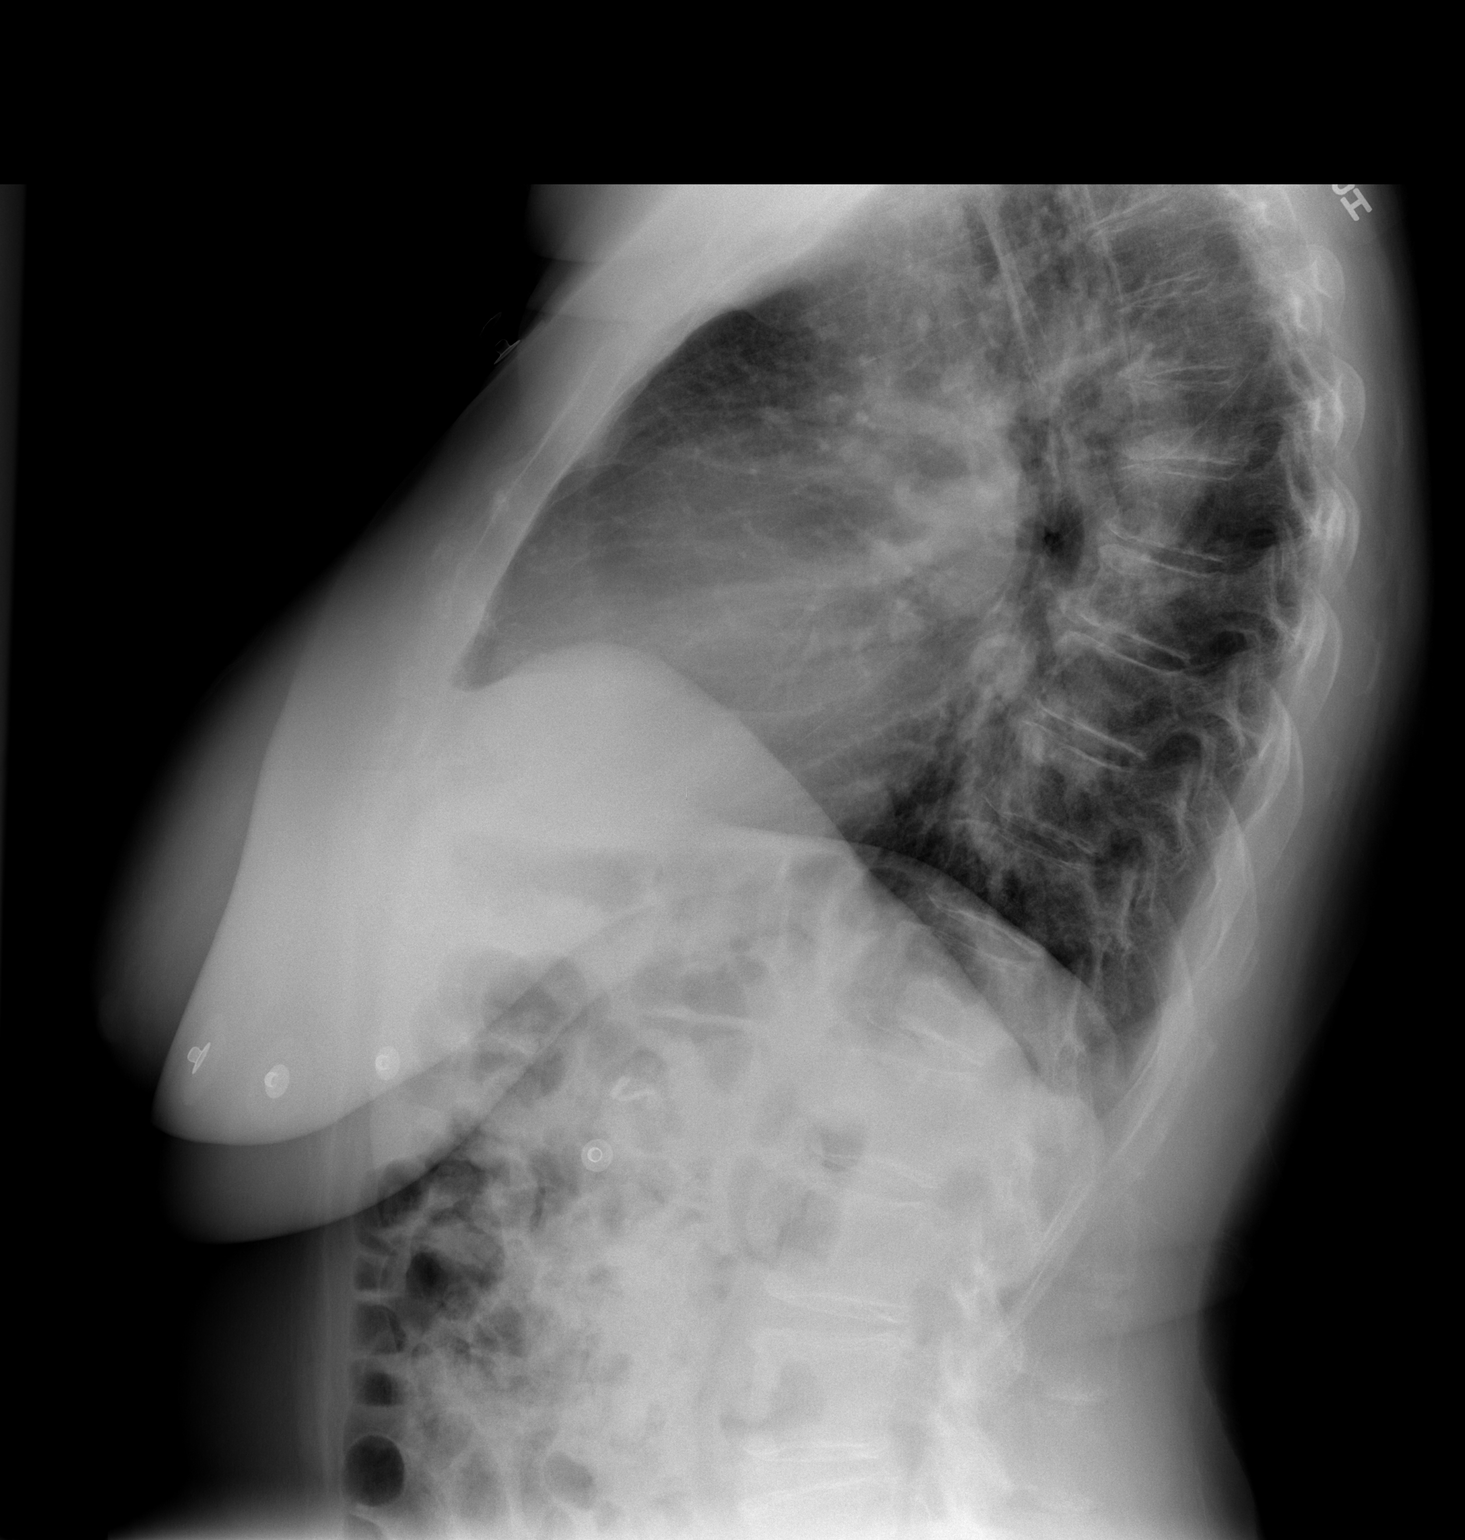

[2 of 2 positions shown; findings below may reference images not displayed]

FINDINGS: Enlargement of cardiac silhouette.
Slight pulmonary vascular congestion.
Mediastinal contours normal.
Lungs clear.
No pleural effusion or pneumothorax.
Bones demineralized.
IMPRESSION: Enlargement of cardiac silhouette with slight pulmonary vascular
congestion.
No acute abnormalities.

## 2012-06-30 MED ORDER — SODIUM CHLORIDE 0.9 % IV BOLUS (SEPSIS)
1000.0000 mL | Freq: Once | INTRAVENOUS | Status: AC
  Administered 2012-06-30: 1000 mL via INTRAVENOUS

## 2012-06-30 NOTE — ED Provider Notes (Signed)
History     CSN: [id number]  Arrival date & time 06/30/12  1531   First MD Initiated Contact with Patient 06/30/12 1531      Chief Complaint  Patient presents with  . Near Syncope    (Consider location/radiation/quality/duration/timing/severity/associated sxs/prior treatment) HPI  Patient presents with episode of near-syncope.  She was at a funeral standing for a prolonged time, subacute he felt lightheaded.  There is no concurrent pain, confusion, disorientation, nausea, vomiting, incontinence. Symptoms lasted approximately 30 minutes, with no complete syncope. On my evaluation the patient states that she feels normal, has no active complaints. She states that prior to this event today, she was in her usual state of health. She does acknowledge not drinking significant of water today. She states that she is prior similar events as well as near syncope and syncope in the past, and has been evaluated by her primary care physician extensively for these events.   Past Medical History  Diagnosis Date  . BACK PAIN 04/30/2008  . OSTEOPOROSIS 03/11/2007  . PLANTAR FASCIITIS 11/16/2006  . SHINGLES 09/27/2007  . SYMPTOMATIC MENOPAUSAL/FEMALE CLIMACTERIC STATES 06/20/2007  . Positive PPD     Past Surgical History  Procedure Laterality Date  . Cholecystectomy    . Abdominal hysterectomy    . Cataract extraction    . Sigmoidoscopy      No family history on file.  History  Substance Use Topics  . Smoking status: Never Smoker   . Smokeless tobacco: Never Used  . Alcohol Use: No    OB History   Grav Para Term Preterm Abortions TAB SAB Ect Mult Living                  Review of Systems  All other systems reviewed and are negative.    Allergies  Penicillin g benzathine and Penicillins  Home Medications   Current Outpatient Rx  Name  Route  Sig  Dispense  Refill  . aspirin 81 MG tablet   Oral   Take 81 mg by mouth at bedtime.          . Omega-3 Fatty Acids (FISH OIL)  1000 MG CAPS   Oral   Take 1 capsule by mouth at bedtime.            BP 143/72  Pulse 66  Temp(Src) 97.7 F (36.5 C) (Oral)  Resp 15  Ht 5\' 9"  (1.753 m)  Wt 182 lb (82.555 kg)  BMI 26.86 kg/m2  SpO2 99%  Physical Exam  Nursing note and vitals reviewed. Constitutional: She is oriented to person, place, and time. She appears well-developed and well-nourished. No distress.  HENT:  Head: Normocephalic and atraumatic.  Eyes: Conjunctivae and EOM are normal.  Cardiovascular: Normal rate and regular rhythm.   Pulmonary/Chest: Effort normal and breath sounds normal. No stridor. No respiratory distress.  Abdominal: She exhibits no distension.  Musculoskeletal: She exhibits no edema.  Neurological: She is alert and oriented to person, place, and time. No cranial nerve deficit.  Skin: Skin is warm and dry.  Psychiatric: She has a normal mood and affect.    ED Course  Procedures (including critical care time)  Labs Reviewed  CBC WITH DIFFERENTIAL  COMPREHENSIVE METABOLIC PANEL  URINALYSIS, ROUTINE W REFLEX MICROSCOPIC   No results found.   No diagnosis found.  Cardiac 70 sinus rhythm normal Pulse ox 100% room air normal    Date: 06/30/2012  Rate: 62  Rhythm: normal sinus rhythm  QRS Axis: left  Intervals: normal  ST/T Wave abnormalities: normal  Conduction Disutrbances:left anterior fascicular block  Narrative Interpretation:   Old EKG Reviewed: unchanged ABNORMAL- Unchanged  Labs notable for ketonuria.  U-culture  sent MDM  This patient presents after a near-syncopal episode.  On exam she is in no distress, with no active complaints. The patient's evaluation is largely unremarkable, and given her description of a significant prior evaluation following similar events, the absence of distress, her reassuring labs, she is discharged in stable condition to follow up with her primary care physician.        Gerhard Munch, MD 06/30/12 [phone]

## 2012-06-30 NOTE — ED Notes (Addendum)
Per EMS- pt was outside at funeral. Pt was standing for approx 1 hour. Became dizzy and weak. Pt went to knees to keep from falling. When EMS arrived pt was in [facility] and was drinking water. Pt symptoms were resolving. Denies pain. VSS with EMS. CBG 109

## 2012-07-08 ENCOUNTER — Ambulatory Visit (INDEPENDENT_AMBULATORY_CARE_PROVIDER_SITE_OTHER): Payer: Medicare Other | Admitting: Internal Medicine

## 2012-07-08 ENCOUNTER — Encounter: Payer: Self-pay | Admitting: Internal Medicine

## 2012-07-08 VITALS — BP 130/76 | HR 69 | Temp 98.4°F | Resp 20 | Wt 184.0 lb

## 2012-07-08 DIAGNOSIS — R55 Syncope and collapse: Secondary | ICD-10-CM

## 2012-07-08 DIAGNOSIS — M549 Dorsalgia, unspecified: Secondary | ICD-10-CM

## 2012-07-08 DIAGNOSIS — M810 Age-related osteoporosis without current pathological fracture: Secondary | ICD-10-CM

## 2012-07-08 NOTE — Patient Instructions (Signed)
It is important that you exercise regularly, at least 20 minutes 3 to 4 times per week.  If you develop chest pain or shortness of breath seek  medical attention.  Return in one year for follow-up   

## 2012-07-08 NOTE — Progress Notes (Signed)
Subjective:    Patient ID: Nicole Blackwell, female    DOB: March 26, 1935, 77 y.o.   MRN: [id number]  HPI  77 year old patient who has a history of recurrent syncope. Approximately one week ago she was seen in the ED after a near syncopal episode that sounds vasovagal. She was at a funeral and at the grave site under the hot afternoon sun when she became quite weak flushed dizzy and had a near syncopal episode. She was seen in the ED and laboratory screen was unremarkable shea hs as done well over the past week.  The patient has been evaluated for syncope including a vent monitor which has been normal  ED records reviewed  Past Medical History  Diagnosis Date  . BACK PAIN 04/30/2008  . OSTEOPOROSIS 03/11/2007  . PLANTAR FASCIITIS 11/16/2006  . SHINGLES 09/27/2007  . SYMPTOMATIC MENOPAUSAL/FEMALE CLIMACTERIC STATES 06/20/2007  . Positive PPD     History   Social History  . Marital Status: Divorced    Spouse Name: N/A    Number of Children: N/A  . Years of Education: N/A   Occupational History  . Not on file.   Social History Main Topics  . Smoking status: Never Smoker   . Smokeless tobacco: Never Used  . Alcohol Use: No  . Drug Use: No  . Sexually Active: Not on file   Other Topics Concern  . Not on file   Social History Narrative  . No narrative on file    Past Surgical History  Procedure Laterality Date  . Cholecystectomy    . Abdominal hysterectomy    . Cataract extraction    . Sigmoidoscopy      No family history on file.  Allergies  Allergen Reactions  . Penicillin G Benzathine Swelling  . Penicillins Swelling    Current Outpatient Prescriptions on File Prior to Visit  Medication Sig Dispense Refill  . aspirin 81 MG tablet Take 81 mg by mouth at bedtime.       . Omega-3 Fatty Acids (FISH OIL) 1000 MG CAPS Take 1 capsule by mouth at bedtime.        No current facility-administered medications on file prior to visit.    BP 130/76  Pulse 69  Temp(Src) 98.4  F (36.9 C) (Oral)  Resp 20  Wt 184 lb (83.462 kg)  BMI 27.16 kg/m2  SpO2 99%       Review of Systems  HENT: Negative for hearing loss, congestion, sore throat, rhinorrhea, dental problem, sinus pressure and tinnitus.   Eyes: Negative for pain, discharge and visual disturbance.  Respiratory: Negative for cough and shortness of breath.   Cardiovascular: Negative for chest pain, palpitations and leg swelling.  Gastrointestinal: Negative for nausea, vomiting, abdominal pain, diarrhea, constipation, blood in stool and abdominal distention.  Genitourinary: Negative for dysuria, urgency, frequency, hematuria, flank pain, vaginal bleeding, vaginal discharge, difficulty urinating, vaginal pain and pelvic pain.  Musculoskeletal: Negative for joint swelling, arthralgias and gait problem.  Skin: Negative for rash.  Neurological: Positive for weakness and light-headedness. Negative for dizziness, syncope, speech difficulty, numbness and headaches.  Hematological: Negative for adenopathy.  Psychiatric/Behavioral: Negative for behavioral problems, dysphoric mood and agitation. The patient is not nervous/anxious.        Objective:   Physical Exam  Constitutional: She is oriented to person, place, and time. She appears well-developed and well-nourished.  HENT:  Head: Normocephalic.  Right Ear: External ear normal.  Left Ear: External ear normal.  Mouth/Throat: Oropharynx is clear  and moist.  Eyes: Conjunctivae and EOM are normal. Pupils are equal, round, and reactive to light.  Neck: Normal range of motion. Neck supple. No thyromegaly present.  Cardiovascular: Normal rate, regular rhythm, normal heart sounds and intact distal pulses.   Pulmonary/Chest: Effort normal and breath sounds normal.  Abdominal: Soft. Bowel sounds are normal. She exhibits no mass. There is no tenderness.  Musculoskeletal: Normal range of motion.  Lymphadenopathy:    She has no cervical adenopathy.  Neurological:  She is alert and oriented to person, place, and time.  Skin: Skin is warm and dry. No rash noted.  Psychiatric: She has a normal mood and affect. Her behavior is normal.          Assessment & Plan:  Near syncope. Sounds vasovagal. Continued observation Exercise regimen modest weight loss encouraged History back pain stable Osteoporosis   CPX one year

## 2012-09-01 ENCOUNTER — Encounter: Payer: Medicare Other | Admitting: Internal Medicine

## 2012-09-12 ENCOUNTER — Encounter (HOSPITAL_COMMUNITY): Payer: Self-pay | Admitting: Family Medicine

## 2012-09-12 ENCOUNTER — Emergency Department (HOSPITAL_COMMUNITY)
Admission: EM | Admit: 2012-09-12 | Discharge: 2012-09-12 | Disposition: A | Discharge status: Home / Self Care | Payer: PRIVATE HEALTH INSURANCE | Attending: Emergency Medicine | Admitting: Emergency Medicine

## 2012-09-12 DIAGNOSIS — M549 Dorsalgia, unspecified: Secondary | ICD-10-CM | POA: Insufficient documentation

## 2012-09-12 DIAGNOSIS — Z79899 Other long term (current) drug therapy: Secondary | ICD-10-CM | POA: Insufficient documentation

## 2012-09-12 DIAGNOSIS — Z880 Allergy status to penicillin: Secondary | ICD-10-CM | POA: Insufficient documentation

## 2012-09-12 DIAGNOSIS — M810 Age-related osteoporosis without current pathological fracture: Secondary | ICD-10-CM | POA: Insufficient documentation

## 2012-09-12 DIAGNOSIS — Z7982 Long term (current) use of aspirin: Secondary | ICD-10-CM | POA: Insufficient documentation

## 2012-09-12 DIAGNOSIS — R55 Syncope and collapse: Secondary | ICD-10-CM | POA: Insufficient documentation

## 2012-09-12 DIAGNOSIS — Z8619 Personal history of other infectious and parasitic diseases: Secondary | ICD-10-CM | POA: Insufficient documentation

## 2012-09-12 DIAGNOSIS — I951 Orthostatic hypotension: Secondary | ICD-10-CM | POA: Insufficient documentation

## 2012-09-12 DIAGNOSIS — Z8742 Personal history of other diseases of the female genital tract: Secondary | ICD-10-CM | POA: Insufficient documentation

## 2012-09-12 DIAGNOSIS — R42 Dizziness and giddiness: Secondary | ICD-10-CM | POA: Insufficient documentation

## 2012-09-12 DIAGNOSIS — Z8611 Personal history of tuberculosis: Secondary | ICD-10-CM | POA: Insufficient documentation

## 2012-09-12 DIAGNOSIS — Z8739 Personal history of other diseases of the musculoskeletal system and connective tissue: Secondary | ICD-10-CM | POA: Insufficient documentation

## 2012-09-12 LAB — URINALYSIS, ROUTINE W REFLEX MICROSCOPIC
Glucose, UA: NEGATIVE mg/dL
Hgb urine dipstick: NEGATIVE
Specific Gravity, Urine: 1.013 (ref 1.005–1.030)
Urobilinogen, UA: 1 mg/dL (ref 0.0–1.0)

## 2012-09-12 LAB — CBC WITH DIFFERENTIAL/PLATELET
Basophils Absolute: 0 10*3/uL (ref 0.0–0.1)
Eosinophils Absolute: 0.1 10*3/uL (ref 0.0–0.7)
Eosinophils Relative: 1 % (ref 0–5)
MCH: 26.5 pg (ref 26.0–34.0)
MCV: 79.7 fL (ref 78.0–100.0)
Platelets: 199 10*3/uL (ref 150–400)
RDW: 15 % (ref 11.5–15.5)
WBC: 6.3 10*3/uL (ref 4.0–10.5)

## 2012-09-12 LAB — COMPREHENSIVE METABOLIC PANEL
ALT: 15 U/L (ref 0–35)
AST: 26 U/L (ref 0–37)
Calcium: 9.5 mg/dL (ref 8.4–10.5)
Sodium: 139 mEq/L (ref 135–145)
Total Protein: 7.2 g/dL (ref 6.0–8.3)

## 2012-09-12 MED ORDER — SODIUM CHLORIDE 0.9 % IV BOLUS (SEPSIS)
1000.0000 mL | Freq: Once | INTRAVENOUS | Status: AC
  Administered 2012-09-12: 1000 mL via INTRAVENOUS

## 2012-09-12 NOTE — ED Provider Notes (Signed)
History    CSN: [id number] Arrival date & time 09/12/12  9147  First MD Initiated Contact with Patient 09/12/12 [phone]     Chief Complaint  Patient presents with  . Loss of Consciousness   (Consider location/radiation/quality/duration/timing/severity/associated sxs/prior Treatment) HPI Pt seen multiple times in the past with near syncopal episodes. States she was in her normal states of health this AM and was standing in line. She got tired and sat down. She then became lightheaded. No fall or trauma. No syncope. No CP, sob, focal weakness, vision changes. Pt states she feels back to her baseline. She is on no prescription medications. No abd pain, N/V/D, urinary symptoms, fever chills.  Past Medical History  Diagnosis Date  . BACK PAIN 04/30/2008  . OSTEOPOROSIS 03/11/2007  . PLANTAR FASCIITIS 11/16/2006  . SHINGLES 09/27/2007  . SYMPTOMATIC MENOPAUSAL/FEMALE CLIMACTERIC STATES 06/20/2007  . Positive PPD    Past Surgical History  Procedure Laterality Date  . Cholecystectomy    . Abdominal hysterectomy    . Cataract extraction    . Sigmoidoscopy     History reviewed. No pertinent family history. History  Substance Use Topics  . Smoking status: Never Smoker   . Smokeless tobacco: Never Used  . Alcohol Use: No   OB History   Grav Para Term Preterm Abortions TAB SAB Ect Mult Living                 Review of Systems  Constitutional: Negative for fever and chills.  HENT: Negative for neck pain.   Eyes: Negative for visual disturbance.  Respiratory: Negative for shortness of breath.   Cardiovascular: Negative for chest pain.  Gastrointestinal: Negative for nausea, vomiting and abdominal pain.  Genitourinary: Negative for dysuria, flank pain and difficulty urinating.  Musculoskeletal: Negative for myalgias and back pain.  Skin: Negative for rash and wound.  Neurological: Positive for dizziness and light-headedness. Negative for syncope, weakness, numbness and headaches.  All  other systems reviewed and are negative.    Allergies  Penicillin g benzathine and Penicillins  Home Medications   Current Outpatient Rx  Name  Route  Sig  Dispense  Refill  . aspirin 81 MG tablet   Oral   Take 81 mg by mouth at bedtime.          . Calcium Carbonate-Vitamin D (CALCIUM + D PO)   Oral   Take 1 tablet by mouth daily.         . Omega-3 Fatty Acids (FISH OIL) 1000 MG CAPS   Oral   Take 1 capsule by mouth at bedtime.           BP 116/62  Pulse 57  Resp 24  SpO2 100% Physical Exam  Nursing note and vitals reviewed. Constitutional: She is oriented to person, place, and time. She appears well-developed and well-nourished. No distress.  HENT:  Head: Normocephalic and atraumatic.  Mouth/Throat: Oropharynx is clear and moist.  Eyes: EOM are normal. Pupils are equal, round, and reactive to light.  Neck: Normal range of motion. Neck supple.  Cardiovascular: Normal rate and regular rhythm.   Pulmonary/Chest: Effort normal and breath sounds normal. No respiratory distress. She has no wheezes. She has no rales.  Abdominal: Soft. Bowel sounds are normal. There is no tenderness. There is no rebound and no guarding.  Musculoskeletal: Normal range of motion. She exhibits no edema and no tenderness.  Neurological: She is alert and oriented to person, place, and time.  5/5 motor in all  ext, sensation intact, finger to nose intact  Skin: Skin is warm and dry. No rash noted. No erythema.  Psychiatric: She has a normal mood and affect. Her behavior is normal.    ED Course  Procedures (including critical care time) Labs Reviewed  CBC WITH DIFFERENTIAL - Abnormal; Notable for the following:    RBC 5.92 (*)    Hemoglobin 15.7 (*)    HCT 47.2 (*)    All other components within normal limits  COMPREHENSIVE METABOLIC PANEL - Abnormal; Notable for the following:    Glucose, Bld 137 (*)    GFR calc non Af Amer 60 (*)    GFR calc Af Amer 70 (*)    All other components  within normal limits  TROPONIN I  URINALYSIS, ROUTINE W REFLEX MICROSCOPIC   No results found. 1. Orthostasis     Date: 09/12/2012  Rate: 64  Rhythm: normal sinus rhythm  QRS Axis: normal  Intervals: normal  ST/T Wave abnormalities: normal  Conduction Disutrbances:none  Narrative Interpretation:   Old EKG Reviewed: none available   MDM  Pt remain asymptomatic in ED. Seen multiple times for similar symptoms. Suspect symptoms due to orthostasis. Advised slow position changes and staying well hydrated. Pt is encouraged to f/u with PMD and return precautions given.   Loren Racer, MD 09/12/12 1320

## 2012-09-12 NOTE — ED Notes (Signed)
Pt. Stated, I was almost to pass out and I heard everybody hollar, "Ria Comment", so I'm really not sure what happened. I didn't pass out just felt like it. When I stood up I was walking funny. Pt. Stated, i fell ok,

## 2012-09-12 NOTE — ED Notes (Signed)
Report to Karen, RN

## 2012-09-12 NOTE — ED Notes (Signed)
Pt. Unable to urinate  

## 2012-09-12 NOTE — ED Notes (Signed)
Per EMS, pt became dizzy this am and passed out. sts very diaphoretic upon arrival. Denies chest pain. 324 ASA. CBG 143. HR 50-60. BP 116/58. No cardiac hx

## 2012-09-20 ENCOUNTER — Encounter: Payer: Self-pay | Admitting: Internal Medicine

## 2012-09-20 ENCOUNTER — Ambulatory Visit (INDEPENDENT_AMBULATORY_CARE_PROVIDER_SITE_OTHER): Payer: PRIVATE HEALTH INSURANCE | Admitting: Internal Medicine

## 2012-09-20 VITALS — BP 150/80 | HR 71 | Temp 98.6°F | Resp 20 | Wt 180.0 lb

## 2012-09-20 DIAGNOSIS — R55 Syncope and collapse: Secondary | ICD-10-CM

## 2012-09-20 NOTE — Progress Notes (Signed)
Subjective:    Patient ID: Nicole Blackwell, female    DOB: Mar 15, 1935, 77 y.o.   MRN: [id number]  HPI  77 year old patient who has a history of vasovagal syncope. She was seen in the ED 8 days ago after becoming quite dizzy and sustaining a fall. There was no loss of consciousness. She was evaluated in the ED. Evaluation included a laboratory screen. At the present time she complains some mild pain involving her right hand and right chest wall. She's had no recurrent syncope or dizziness since the ED visit.  Past Medical History  Diagnosis Date  . BACK PAIN 04/30/2008  . OSTEOPOROSIS 03/11/2007  . PLANTAR FASCIITIS 11/16/2006  . SHINGLES 09/27/2007  . SYMPTOMATIC MENOPAUSAL/FEMALE CLIMACTERIC STATES 06/20/2007  . Positive PPD     History   Social History  . Marital Status: Divorced    Spouse Name: N/A    Number of Children: N/A  . Years of Education: N/A   Occupational History  . Not on file.   Social History Main Topics  . Smoking status: Never Smoker   . Smokeless tobacco: Never Used  . Alcohol Use: No  . Drug Use: No  . Sexually Active: Not on file   Other Topics Concern  . Not on file   Social History Narrative  . No narrative on file    Past Surgical History  Procedure Laterality Date  . Cholecystectomy    . Abdominal hysterectomy    . Cataract extraction    . Sigmoidoscopy      No family history on file.  Allergies  Allergen Reactions  . Penicillin G Benzathine Swelling  . Penicillins Swelling    Current Outpatient Prescriptions on File Prior to Visit  Medication Sig Dispense Refill  . aspirin 81 MG tablet Take 81 mg by mouth at bedtime.       . Calcium Carbonate-Vitamin D (CALCIUM + D PO) Take 1 tablet by mouth daily.      . Omega-3 Fatty Acids (FISH OIL) 1000 MG CAPS Take 1 capsule by mouth at bedtime.        No current facility-administered medications on file prior to visit.    BP 150/80  Pulse 71  Temp(Src) 98.6 F (37 C) (Oral)  Resp 20  Wt 180  lb (81.647 kg)  BMI 26.57 kg/m2  SpO2 98%       Review of Systems  Constitutional: Negative.   HENT: Negative for hearing loss, congestion, sore throat, rhinorrhea, dental problem, sinus pressure and tinnitus.   Eyes: Negative for pain, discharge and visual disturbance.  Respiratory: Negative for cough and shortness of breath.   Cardiovascular: Negative for chest pain, palpitations and leg swelling.  Gastrointestinal: Negative for nausea, vomiting, abdominal pain, diarrhea, constipation, blood in stool and abdominal distention.  Genitourinary: Negative for dysuria, urgency, frequency, hematuria, flank pain, vaginal bleeding, vaginal discharge, difficulty urinating, vaginal pain and pelvic pain.  Musculoskeletal: Negative for joint swelling, arthralgias and gait problem.  Skin: Negative for rash.  Neurological: Positive for dizziness. Negative for syncope, speech difficulty, weakness, numbness and headaches.  Hematological: Negative for adenopathy.  Psychiatric/Behavioral: Negative for behavioral problems, dysphoric mood and agitation. The patient is not nervous/anxious.        Objective:   Physical Exam  Constitutional: She is oriented to person, place, and time. She appears well-developed and well-nourished.  HENT:  Head: Normocephalic.  Right Ear: External ear normal.  Left Ear: External ear normal.  Mouth/Throat: Oropharynx is clear and moist.  Eyes: Conjunctivae and EOM are normal. Pupils are equal, round, and reactive to light.  Neck: Normal range of motion. Neck supple. No thyromegaly present.  Cardiovascular: Normal rate, regular rhythm, normal heart sounds and intact distal pulses.   Pulmonary/Chest: Effort normal and breath sounds normal.  Abdominal: Soft. Bowel sounds are normal. She exhibits no mass. There is no tenderness.  Musculoskeletal: Normal range of motion.  Lymphadenopathy:    She has no cervical adenopathy.  Neurological: She is alert and oriented to  person, place, and time.  Skin: Skin is warm and dry. No rash noted.  Psychiatric: She has a normal mood and affect. Her behavior is normal.          Assessment & Plan:   Recurrent vasovagal syncope Fall with minor trauma  Situation discussed at length. She is aware that at the onset of symptoms she should sit or preferably lay flat to avoid frank syncope  Return for her annual physical as scheduled No further evaluation deemed necessary at this time

## 2012-09-20 NOTE — Patient Instructions (Signed)
Keep well-hydratedVasovagal Syncope, Adult Syncope, commonly known as fainting, is a temporary loss of consciousness. It occurs when the blood flow to the brain is reduced. Vasovagal syncope (also called neurocardiogenic syncope) is a fainting spell in which the blood flow to the brain is reduced because of a sudden drop in heart rate and blood pressure. Vasovagal syncope occurs when the brain and the cardiovascular system (blood vessels) do not adequately communicate and respond to each other. This is the most common cause of fainting. It often occurs in response to fear or some other type of emotional or physical stress. The body has a reaction in which the heart starts beating too slowly or the blood vessels expand, reducing blood pressure. This type of fainting spell is generally considered harmless. However, injuries can occur if a person takes a sudden fall during a fainting spell.  CAUSES  Vasovagal syncope occurs when a person's blood pressure and heart rate decrease suddenly, usually in response to a trigger. Many things and situations can trigger an episode. Some of these include:   Pain.   Fear.   The sight of blood or medical procedures, such as blood being drawn from a vein.   Common activities, such as coughing, swallowing, stretching, or going to the bathroom.   Emotional stress.   Prolonged standing, especially in a warm environment.   Lack of sleep or rest.   Prolonged lack of food.   Prolonged lack of fluids.   Recent illness.  The use of certain drugs that affect blood pressure, such as cocaine, alcohol, marijuana, inhalants, and opiates.  SYMPTOMS  Before the fainting episode, you may:   Feel dizzy or light headed.   Become pale.  Sense that you are going to faint.   Feel like the room is spinning.   Have tunnel vision, only seeing directly in front of you.   Feel sick to your stomach (nauseous).   See spots or slowly lose vision.   Hear  ringing in your ears.   Have a headache.   Feel warm and sweaty.   Feel a sensation of pins and needles. During the fainting spell, you will generally be unconscious for no longer than a couple minutes before waking up and returning to normal. If you get up too quickly before your body can recover, you may faint again. Some twitching or jerky movements may occur during the fainting spell.  DIAGNOSIS  Your caregiver will ask about your symptoms, take a medical history, and perform a physical exam. Various tests may be done to rule out other causes of fainting. These may include blood tests and tests to check the heart, such as electrocardiography, echocardiography, and possibly an electrophysiology study. When other causes have been ruled out, a test may be done to check the body's response to changes in position (tilt table test). TREATMENT  Most cases of vasovagal syncope do not require treatment. Your caregiver may recommend ways to avoid fainting triggers and may provide home strategies for preventing fainting. If you must be exposed to a possible trigger, you can drink additional fluids to help reduce your chances of having an episode of vasovagal syncope. If you have warning signs of an oncoming episode, you can respond by positioning yourself favorably (lying down). If your fainting spells continue, you may be given medicines to prevent fainting. Some medicines may help make you more resistant to repeated episodes of vasovagal syncope. Special exercises or compression stockings may be recommended. In rare cases, the surgical  placement of a pacemaker is considered. HOME CARE INSTRUCTIONS   Learn to identify the warning signs of vasovagal syncope.   Sit or lie down at the first warning sign of a fainting spell. If sitting, put your head down between your legs. If you lie down, swing your legs up in the air to increase blood flow to the brain.   Avoid hot tubs and saunas.  Avoid  prolonged standing.  Drink enough fluids to keep your urine clear or pale yellow. Avoid caffeine.  Increase salt in your diet as directed by your caregiver.   If you have to stand for a long time, perform movements such as:   Crossing your legs.   Flexing and stretching your leg muscles.   Squatting.   Moving your legs.   Bending over.   Only take over-the-counter or prescription medicines as directed by your caregiver. Do not suddenly stop any medicines without asking your caregiver first. SEEK MEDICAL CARE IF:   Your fainting spells continue or happen more frequently in spite of treatment.   You lose consciousness for more than a couple minutes.  You have fainting spells during or after exercising or after being startled.   You have new symptoms that occur with the fainting spells, such as:   Shortness of breath.  Chest pain.   Irregular heartbeat.   You have episodes of twitching or jerky movements that last longer than a few seconds.  You have episodes of twitching or jerky movements without obvious fainting. SEEK IMMEDIATE MEDICAL CARE IF:   You have injuries or bleeding after a fainting spell.   You have episodes of twitching or jerky movements that last longer than 5 minutes.   You have more than one spell of twitching or jerky movements before returning to consciousness after fainting. MAKE SURE YOU:   Understand these instructions.  Will watch your condition.  Will get help right away if you are not doing well or get worse. Document Released: 01/27/2012 Document Reviewed: 01/08/2012 [facility] Patient Information 2014 [place], [place].

## 2012-09-23 ENCOUNTER — Telehealth: Payer: Self-pay | Admitting: Internal Medicine

## 2012-09-23 NOTE — Telephone Encounter (Signed)
Spoke to pt told her Dr. Amador Cunas said okay to drive. Pt verbalized understanding.

## 2012-09-23 NOTE — Telephone Encounter (Signed)
Ok to drive 

## 2012-09-23 NOTE — Telephone Encounter (Signed)
PT is calling to inquire as to if she can drive or not. She states that she was instructed not to, but needs to go out today, and would like to drive herself. Please assist.

## 2013-09-13 ENCOUNTER — Emergency Department (HOSPITAL_COMMUNITY)
Admission: EM | Admit: 2013-09-13 | Discharge: 2013-09-13 | Disposition: A | Discharge status: Home / Self Care | Payer: PRIVATE HEALTH INSURANCE | Attending: Emergency Medicine | Admitting: Emergency Medicine

## 2013-09-13 ENCOUNTER — Encounter (HOSPITAL_COMMUNITY): Payer: Self-pay | Admitting: Emergency Medicine

## 2013-09-13 ENCOUNTER — Emergency Department (HOSPITAL_COMMUNITY): Payer: PRIVATE HEALTH INSURANCE

## 2013-09-13 DIAGNOSIS — Z8742 Personal history of other diseases of the female genital tract: Secondary | ICD-10-CM | POA: Diagnosis not present

## 2013-09-13 DIAGNOSIS — M129 Arthropathy, unspecified: Secondary | ICD-10-CM | POA: Insufficient documentation

## 2013-09-13 DIAGNOSIS — Z880 Allergy status to penicillin: Secondary | ICD-10-CM | POA: Insufficient documentation

## 2013-09-13 DIAGNOSIS — M25569 Pain in unspecified knee: Secondary | ICD-10-CM | POA: Diagnosis present

## 2013-09-13 DIAGNOSIS — Z8619 Personal history of other infectious and parasitic diseases: Secondary | ICD-10-CM | POA: Diagnosis not present

## 2013-09-13 DIAGNOSIS — Z7982 Long term (current) use of aspirin: Secondary | ICD-10-CM | POA: Diagnosis not present

## 2013-09-13 DIAGNOSIS — M1990 Unspecified osteoarthritis, unspecified site: Secondary | ICD-10-CM

## 2013-09-13 IMAGING — CR DG KNEE COMPLETE 4+V*R*
4 series · 4 of 4 positions shown · non-contrast
Comparison: None.

CLINICAL DATA: Right knee pain and swelling.

EXAM:
RIGHT KNEE - COMPLETE 4+ VIEW

[t knee ap right]
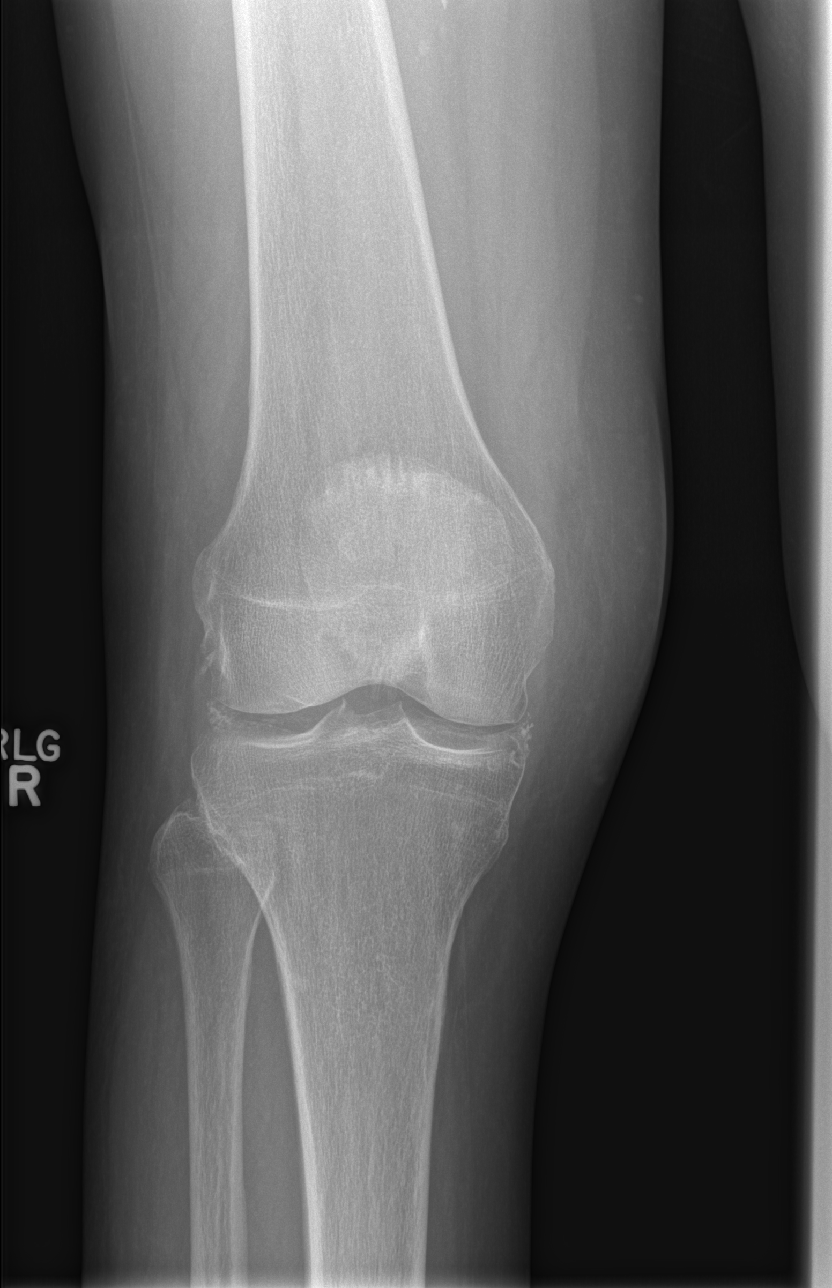

[t knee obl right (1 of 2)]
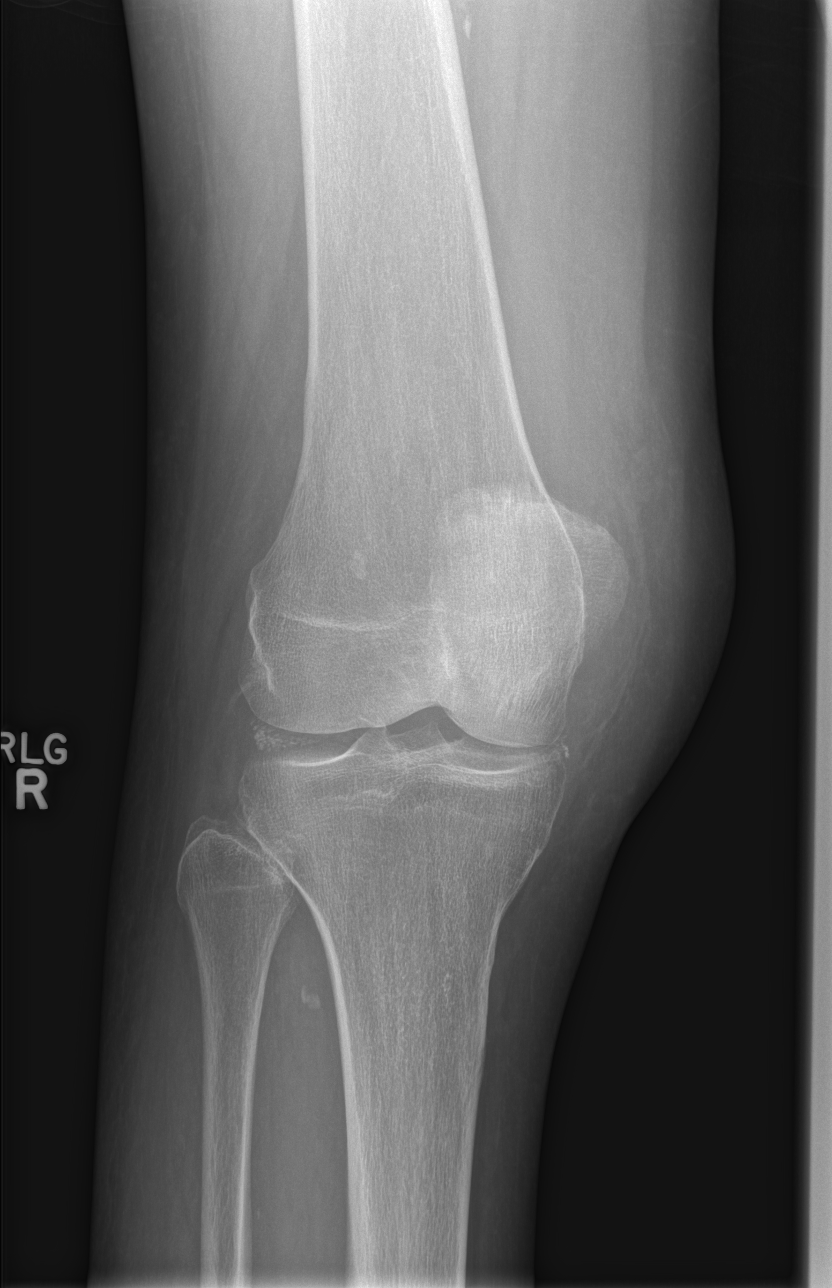

[t knee obl right (2 of 2)]
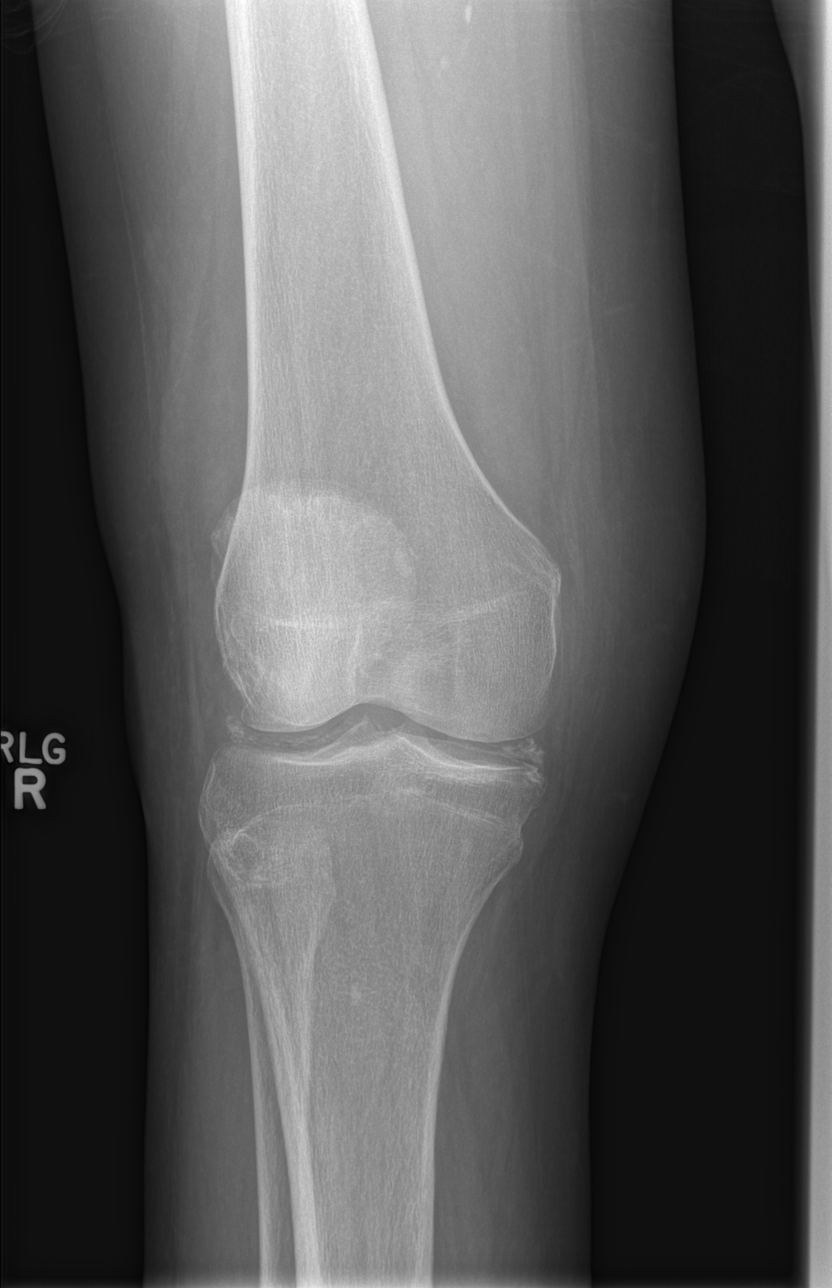

[t knee lat right]
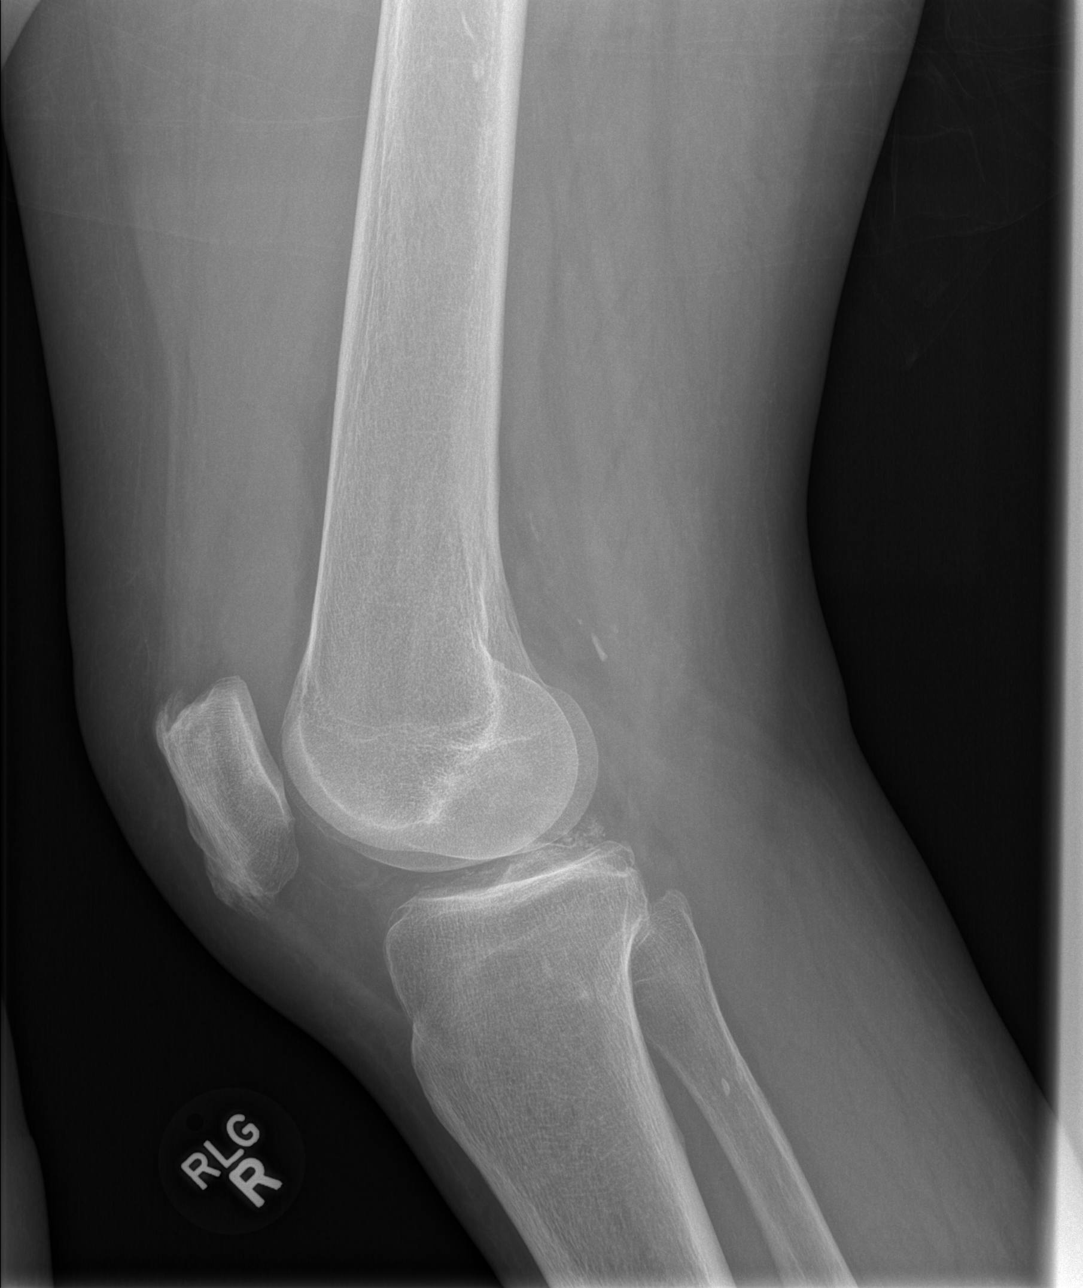

[4 of 4 positions shown; findings below may reference images not displayed]

FINDINGS: There is extensive chondrocalcinosis and diffuse osteopenia. There
is a large joint effusion. No joint space narrowing.
IMPRESSION: Chondrocalcinosis consistent with CPPD arthritis. Large joint
effusion. No acute osseous abnormality.

## 2013-09-13 MED ORDER — MELOXICAM 7.5 MG PO TABS: 7.5000 mg | ORAL_TABLET | Freq: Every day | ORAL | Status: DC

## 2013-09-13 NOTE — ED Provider Notes (Addendum)
Pt presents with pain in her right knee for a couple of days. States it hurts especially when she first gets up then gets better. No hx of swelling in her other joints. No injury.   Pt is noted to have diffuse swelling of both knees but the right is larger than the left. There is no redness or warmth noted. Pt is noted to have some difficulty bearing weight when she gets up.    Medical screening examination/treatment/procedure(s) were conducted as a shared visit with non-physician practitioner(s) and myself.  I personally evaluated the patient during the encounter.   EKG Interpretation None       Rolland Porter, MD, FACEP   Janice Norrie, MD 09/13/13 [place], MD 09/13/13 [phone]

## 2013-09-13 NOTE — ED Notes (Signed)
Pt c/o right knee pain and swelling. Denies any injury.

## 2013-09-13 NOTE — Discharge Instructions (Signed)
Arthritis, Nonspecific °Arthritis is pain, redness, warmth, or puffiness (inflammation) of a joint. The joint may be stiff or hurt when you move it. One or more joints may be affected. There are many types of arthritis. Your doctor may not know what type you have right away. The most common cause of arthritis is wear and tear on the joint (osteoarthritis). °HOME CARE  °· Only take medicine as told by your doctor. °· Rest the joint as much as possible. °· Raise (elevate) your joint if it is puffy. °· Use crutches if the painful joint is in your leg. °· Drink enough fluids to keep your pee (urine) clear or pale yellow. °· Follow your doctor's diet instructions. °· Use cold packs for very bad joint pain for 10 to 15 minutes every hour. Ask your doctor if it is okay for you to use hot packs. °· Exercise as told by your doctor. °· Take a warm shower if you have stiffness in the morning. °· Move your sore joints throughout the day. °GET HELP RIGHT AWAY IF:  °· You have a fever. °· You have very bad joint pain, puffiness, or redness. °· You have many joints that are painful and puffy. °· You are not getting better with treatment. °· You have very bad back pain or leg weakness. °· You cannot control when you poop (bowel movement) or pee (urinate). °· You do not feel better in 24 hours or are getting worse. °· You are having side effects from your medicine. °MAKE SURE YOU:  °· Understand these instructions. °· Will watch your condition. °· Will get help right away if you are not doing well or get worse. °Document Released: 05/06/2009 Document Revised: 08/11/2011 Document Reviewed: 05/06/2009 °[employer]® Patient Information ©2015 [employer]. This information is not intended to replace advice given to you by your health care provider. Make sure you discuss any questions you have with your health care provider. ° °

## 2013-09-13 NOTE — ED Provider Notes (Signed)
CSN: [id number]     Arrival date & time 09/13/13  3976 History   First MD Initiated Contact with Patient 09/13/13 1014     Chief Complaint  Patient presents with  . Knee Pain    right     (Consider location/radiation/quality/duration/timing/severity/associated sxs/prior Treatment) Patient is a 78 y.o. female presenting with knee pain. The history is provided by the patient. No language interpreter was used.  Knee Pain Location:  Knee Knee location:  R knee Pain details:    Quality:  Aching Worsened by:  Activity Ineffective treatments:  Acetaminophen Associated symptoms: no fever   Associated symptoms comment:  Right knee pain and swelling for the past several days without fever, redness or known injury. No history or recurrent knee swelling in the past.    Past Medical History  Diagnosis Date  . BACK PAIN 04/30/2008  . OSTEOPOROSIS 03/11/2007  . PLANTAR FASCIITIS 11/16/2006  . SHINGLES 09/27/2007  . SYMPTOMATIC MENOPAUSAL/FEMALE CLIMACTERIC STATES 06/20/2007  . Positive PPD    Past Surgical History  Procedure Laterality Date  . Cholecystectomy    . Abdominal hysterectomy    . Cataract extraction    . Sigmoidoscopy     No family history on file. History  Substance Use Topics  . Smoking status: Never Smoker   . Smokeless tobacco: Never Used  . Alcohol Use: No   OB History   Grav Para Term Preterm Abortions TAB SAB Ect Mult Living                 Review of Systems  Constitutional: Negative for fever and chills.  Musculoskeletal: Positive for arthralgias.       See HPI.  Skin: Negative.  Negative for color change and wound.  Neurological: Negative.  Negative for numbness.      Allergies  Penicillin g benzathine and Penicillins  Home Medications   Prior to Admission medications   Medication Sig Start Date End Date Taking? Authorizing Provider  acetaminophen (TYLENOL) 500 MG tablet Take 500 mg by mouth every 6 (six) hours as needed for mild pain.   Yes  Historical Provider, MD  aspirin 81 MG tablet Take 81 mg by mouth at bedtime.    Yes Historical Provider, MD   BP 156/70  Pulse 67  Temp(Src) 98.1 F (36.7 C) (Oral)  Resp 20  SpO2 100% Physical Exam  Constitutional: She is oriented to person, place, and time. She appears well-developed and well-nourished.  Neck: Normal range of motion.  Pulmonary/Chest: Effort normal.  Musculoskeletal:  Right knee with moderate effusion. No redness. Moderate tenderness. FROM, fully weight bearing. No calf or thigh tenderness or swelling. No posterior knee swelling.   Neurological: She is alert and oriented to person, place, and time.  Skin: Skin is warm and dry.    ED Course  Procedures (including critical care time) Labs Review Labs Reviewed - No data to display  Imaging Review Dg Knee Complete 4 Views Right  09/13/2013   CLINICAL DATA:  Right knee pain and swelling.  EXAM: RIGHT KNEE - COMPLETE 4+ VIEW  COMPARISON:  None.  FINDINGS: There is extensive chondrocalcinosis and diffuse osteopenia. There is a large joint effusion. No joint space narrowing.  IMPRESSION: Chondrocalcinosis consistent with CPPD arthritis. Large joint effusion. No acute osseous abnormality.   Electronically Signed   By: Rozetta Nunnery M.D.   On: 09/13/2013 10:08     EKG Interpretation None      MDM   Final diagnoses:  None  1. Arthritis  X-rays support CPPD arthritis. Patient was offered arthrocentesis but declined. Will start on anti-inflammatory medication and encourage PCP follow up for recheck.     Dewaine Oats, PA-C 09/13/13 1109

## 2013-12-04 ENCOUNTER — Telehealth: Payer: Self-pay

## 2013-12-04 NOTE — Telephone Encounter (Signed)
Tried to call all numbers and they are invalid.    RE: Pt needs AWV

## 2014-08-01 ENCOUNTER — Emergency Department (HOSPITAL_COMMUNITY)
Admission: EM | Admit: 2014-08-01 | Discharge: 2014-08-01 | Disposition: A | Discharge status: Home / Self Care | Payer: Medicare Other | Attending: Emergency Medicine | Admitting: Emergency Medicine

## 2014-08-01 ENCOUNTER — Encounter (HOSPITAL_COMMUNITY): Payer: Self-pay | Admitting: *Deleted

## 2014-08-01 ENCOUNTER — Emergency Department (HOSPITAL_COMMUNITY): Payer: Medicare Other

## 2014-08-01 DIAGNOSIS — I447 Left bundle-branch block, unspecified: Secondary | ICD-10-CM | POA: Diagnosis not present

## 2014-08-01 DIAGNOSIS — Z8619 Personal history of other infectious and parasitic diseases: Secondary | ICD-10-CM | POA: Diagnosis not present

## 2014-08-01 DIAGNOSIS — Z8739 Personal history of other diseases of the musculoskeletal system and connective tissue: Secondary | ICD-10-CM | POA: Insufficient documentation

## 2014-08-01 DIAGNOSIS — Z880 Allergy status to penicillin: Secondary | ICD-10-CM | POA: Insufficient documentation

## 2014-08-01 DIAGNOSIS — Z7982 Long term (current) use of aspirin: Secondary | ICD-10-CM | POA: Diagnosis not present

## 2014-08-01 DIAGNOSIS — R531 Weakness: Secondary | ICD-10-CM | POA: Diagnosis present

## 2014-08-01 DIAGNOSIS — Z8742 Personal history of other diseases of the female genital tract: Secondary | ICD-10-CM | POA: Insufficient documentation

## 2014-08-01 DIAGNOSIS — R5383 Other fatigue: Secondary | ICD-10-CM | POA: Insufficient documentation

## 2014-08-01 DIAGNOSIS — R1110 Vomiting, unspecified: Secondary | ICD-10-CM | POA: Diagnosis not present

## 2014-08-01 DIAGNOSIS — R079 Chest pain, unspecified: Secondary | ICD-10-CM | POA: Diagnosis not present

## 2014-08-01 LAB — CBC WITH DIFFERENTIAL/PLATELET
BASOS ABS: 0 10*3/uL (ref 0.0–0.1)
Basophils Relative: 0 % (ref 0–1)
EOS ABS: 0 10*3/uL (ref 0.0–0.7)
Eosinophils Relative: 1 % (ref 0–5)
HCT: 47.4 % — ABNORMAL HIGH (ref 36.0–46.0)
HEMOGLOBIN: 14.9 g/dL (ref 12.0–15.0)
LYMPHS ABS: 2.8 10*3/uL (ref 0.7–4.0)
LYMPHS PCT: 46 % (ref 12–46)
MCH: 25.3 pg — ABNORMAL LOW (ref 26.0–34.0)
MCHC: 31.4 g/dL (ref 30.0–36.0)
MCV: 80.6 fL (ref 78.0–100.0)
Monocytes Absolute: 0.3 10*3/uL (ref 0.1–1.0)
Monocytes Relative: 4 % (ref 3–12)
NEUTROS ABS: 3 10*3/uL (ref 1.7–7.7)
Neutrophils Relative %: 49 % (ref 43–77)
Platelets: 217 10*3/uL (ref 150–400)
RBC: 5.88 MIL/uL — ABNORMAL HIGH (ref 3.87–5.11)
RDW: 14.9 % (ref 11.5–15.5)
WBC: 6.1 10*3/uL (ref 4.0–10.5)

## 2014-08-01 LAB — COMPREHENSIVE METABOLIC PANEL
ALT: 18 U/L (ref 14–54)
AST: 30 U/L (ref 15–41)
Albumin: 3.6 g/dL (ref 3.5–5.0)
Alkaline Phosphatase: 52 U/L (ref 38–126)
Anion gap: 6 (ref 5–15)
BUN: 8 mg/dL (ref 6–20)
CALCIUM: 8.9 mg/dL (ref 8.9–10.3)
CHLORIDE: 105 mmol/L (ref 101–111)
CO2: 28 mmol/L (ref 22–32)
Creatinine, Ser: 0.93 mg/dL (ref 0.44–1.00)
GFR calc non Af Amer: 58 mL/min — ABNORMAL LOW (ref >60)
GLUCOSE: 98 mg/dL (ref 65–99)
Potassium: 3.9 mmol/L (ref 3.5–5.1)
SODIUM: 139 mmol/L (ref 135–145)
TOTAL PROTEIN: 6.7 g/dL (ref 6.5–8.1)
Total Bilirubin: 1.4 mg/dL — ABNORMAL HIGH (ref 0.3–1.2)

## 2014-08-01 LAB — URINALYSIS, ROUTINE W REFLEX MICROSCOPIC
BILIRUBIN URINE: NEGATIVE
GLUCOSE, UA: NEGATIVE mg/dL
Hgb urine dipstick: NEGATIVE
Ketones, ur: NEGATIVE mg/dL
NITRITE: NEGATIVE
PROTEIN: NEGATIVE mg/dL
Specific Gravity, Urine: 1.025 (ref 1.005–1.030)
UROBILINOGEN UA: 1 mg/dL (ref 0.0–1.0)
pH: 6 (ref 5.0–8.0)

## 2014-08-01 LAB — URINE MICROSCOPIC-ADD ON

## 2014-08-01 LAB — I-STAT TROPONIN, ED: Troponin i, poc: 0 ng/mL (ref 0.00–0.08)

## 2014-08-01 LAB — TROPONIN I: Troponin I: <0.03 ng/mL (ref <0.031)

## 2014-08-01 IMAGING — CR DG CHEST 1V PORT
1 series · 1 of 1 positions shown · non-contrast
Comparison: [DATE] and earlier.

CLINICAL DATA: 79-year-old female with chest pain and weakness
since yesterday. Initial encounter.

EXAM:
PORTABLE CHEST - 1 VIEW

[AP]
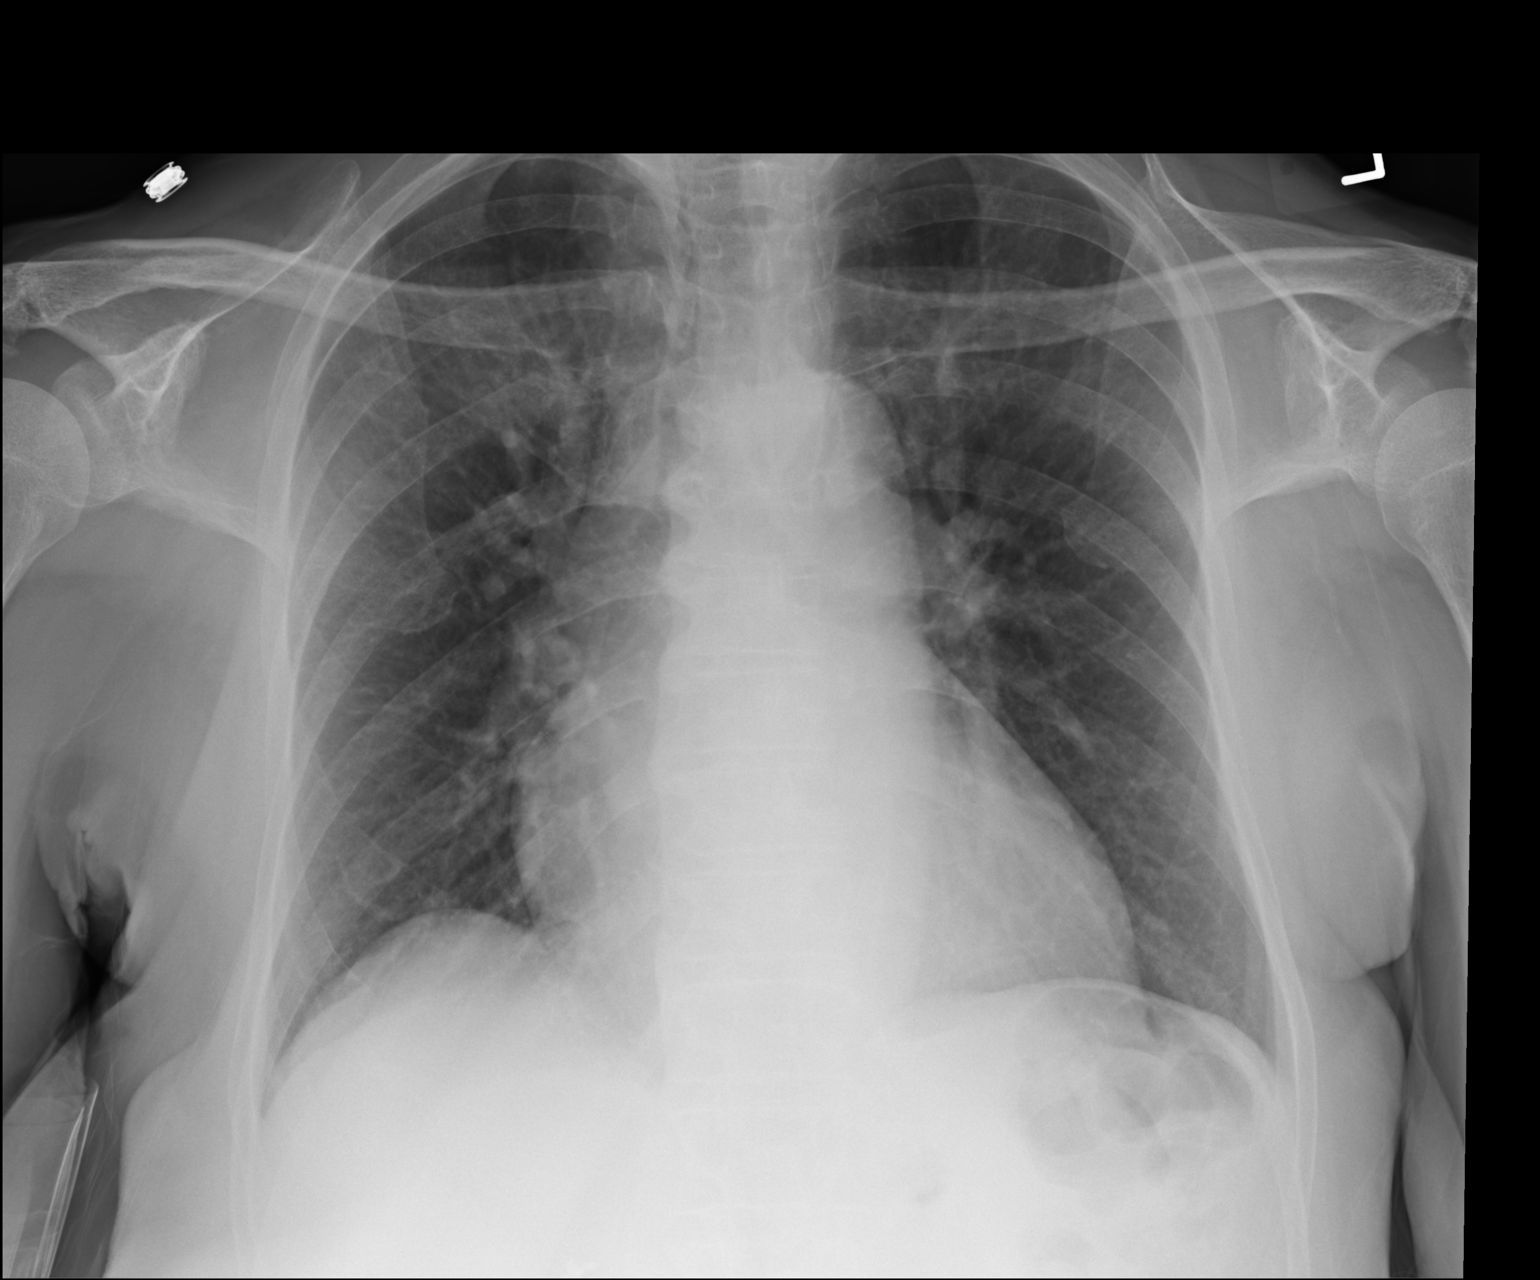

[1 of 1 positions shown; findings below may reference images not displayed]

FINDINGS: Portable AP semi upright view at [ZM] hours. Stable lung volumes.
Stable cardiomegaly and mediastinal contours. Allowing for portable
technique, the lungs are clear. No pneumothorax or pleural effusion.
IMPRESSION: No acute cardiopulmonary abnormality.

## 2014-08-01 IMAGING — CT CT HEAD W/O CM
1 series · 16 of 30 positions shown, 20 images · non-contrast
Comparison: [DATE]

CLINICAL DATA: Decreased energy level. Loss of appetite. Vomiting.

EXAM:
CT HEAD WITHOUT CONTRAST
TECHNIQUE: Contiguous axial images were obtained from the base of the skull
through the vertex without intravenous contrast.

[Series 2: head 5.0 h31s · axial · 0.44mm/px · z∈[-157,-22]mm · 16 of 30 slices shown, 20 images]
[im 2/30  brain]
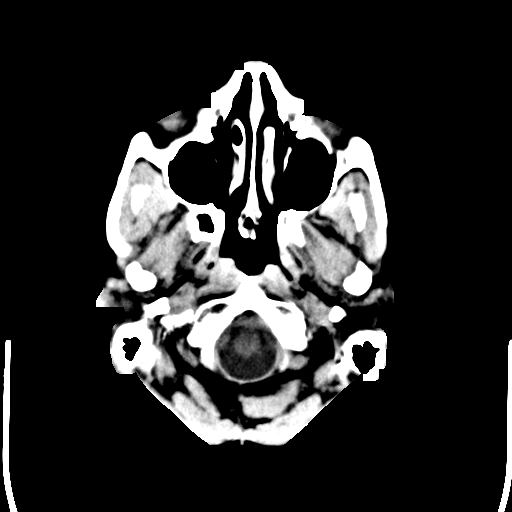
[im 2/30  bone]
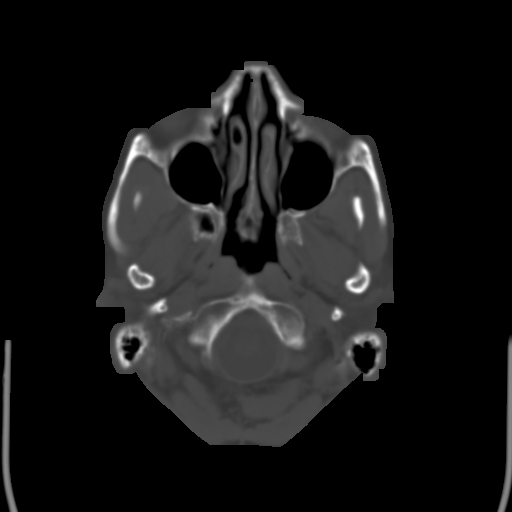
[im 4/30  brain]
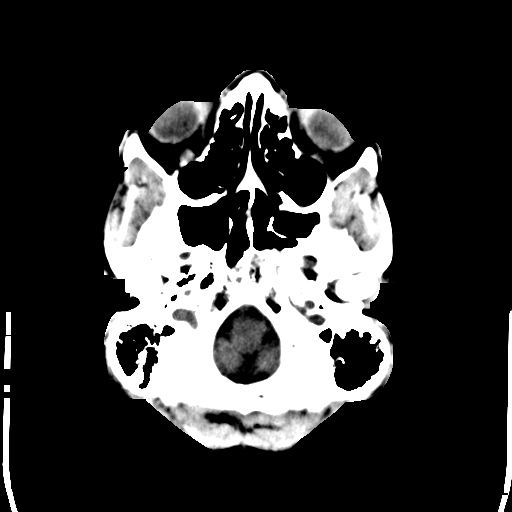
[im 6/30  brain]
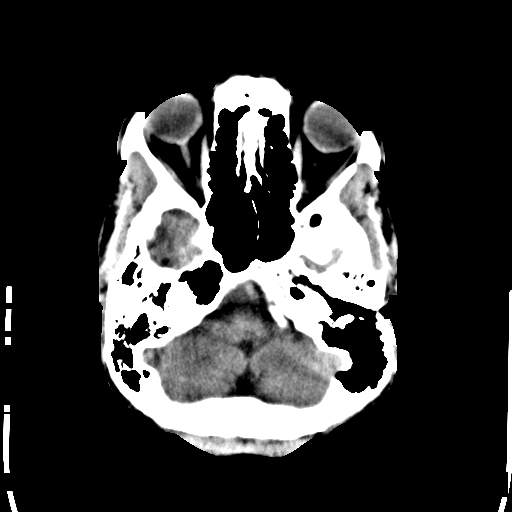
[im 8/30  brain]
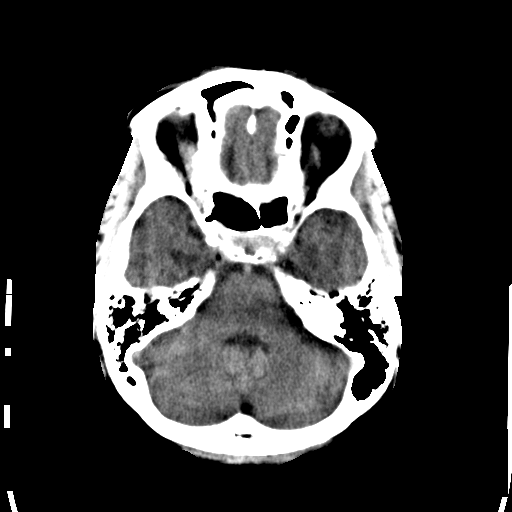
[im 9/30  brain]
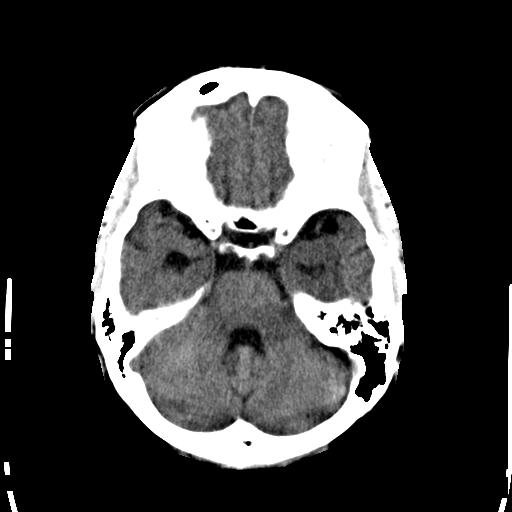
[im 9/30  bone]
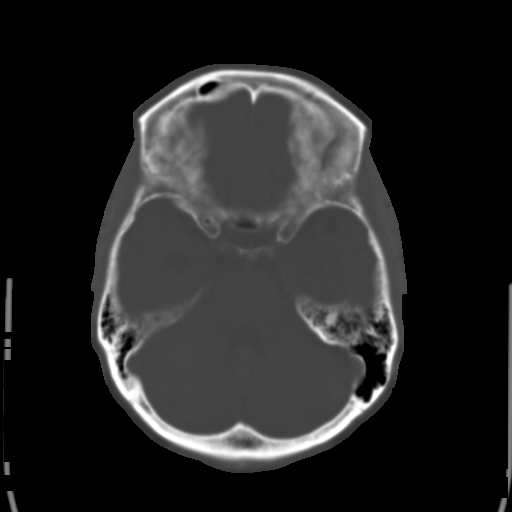
[im 11/30  brain]
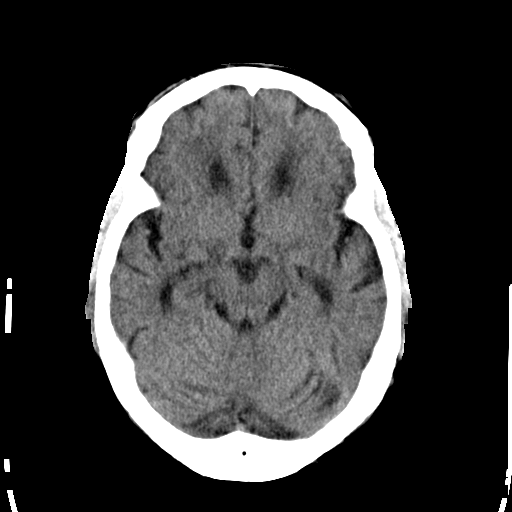
[im 13/30  brain]
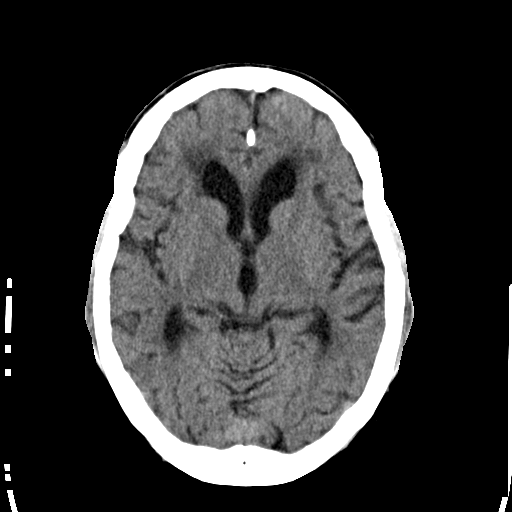
[im 15/30  brain]
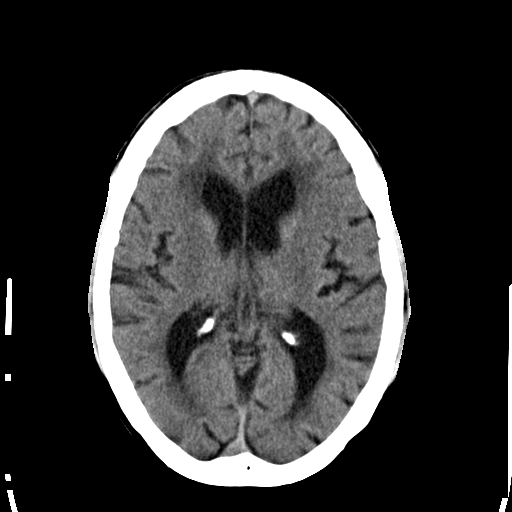
[im 16/30  brain]
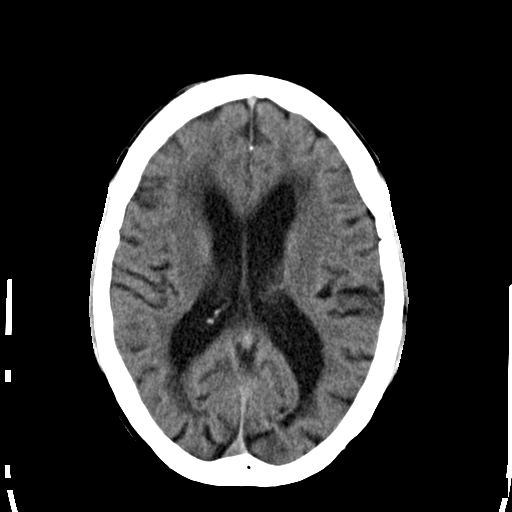
[im 16/30  bone]
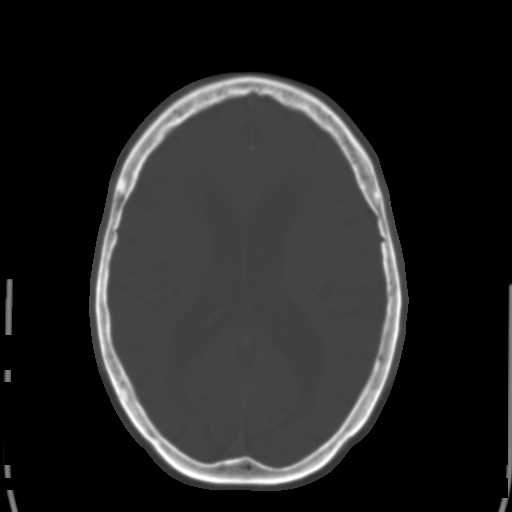
[im 18/30  brain]
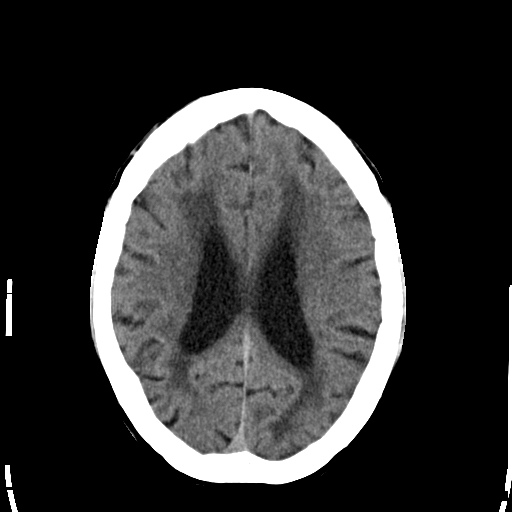
[im 20/30  brain]
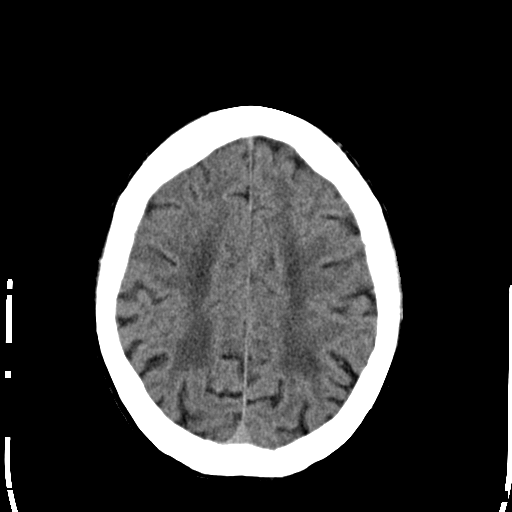
[im 22/30  brain]
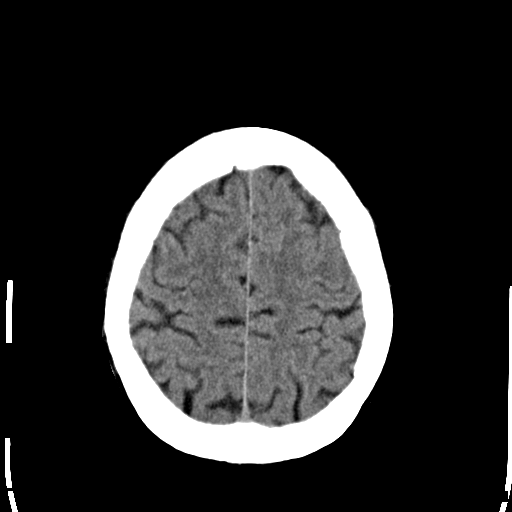
[im 23/30  brain]
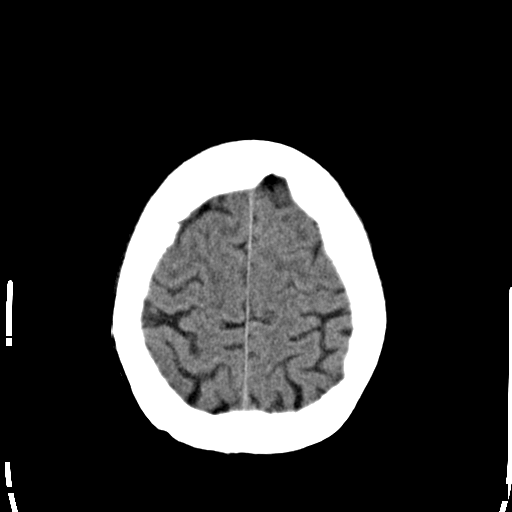
[im 23/30  bone]
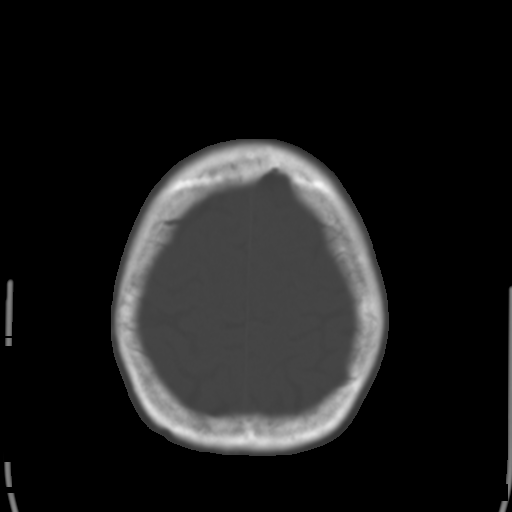
[im 25/30  brain]
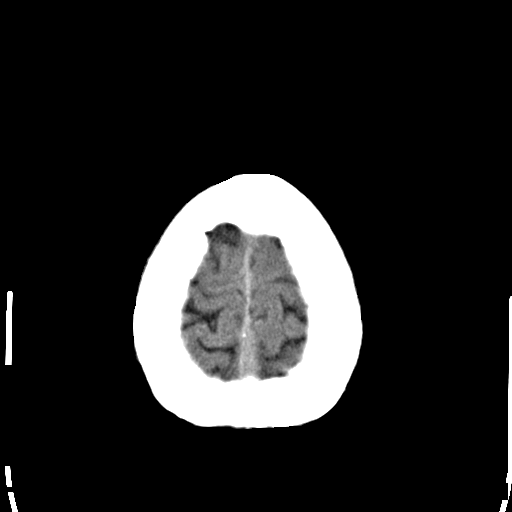
[im 27/30  brain]
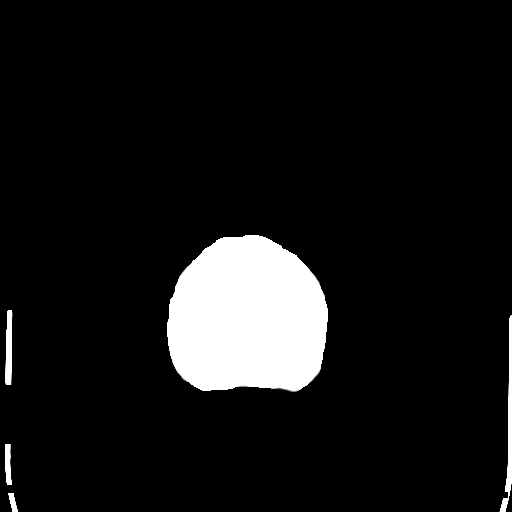
[im 29/30  brain]
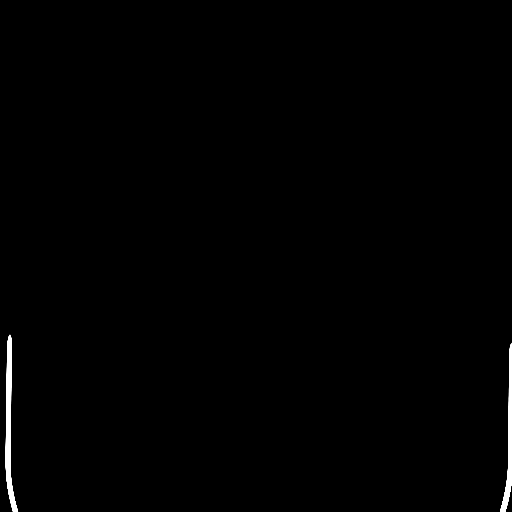

[16 of 30 positions shown; findings below may reference images not displayed]

FINDINGS: There is atrophy and chronic small vessel disease changes. No acute
intracranial abnormality. Specifically, no hemorrhage,
hydrocephalus, mass lesion, acute infarction, or significant
intracranial injury. No acute calvarial abnormality. Visualized
paranasal sinuses and mastoids clear. Orbital soft tissues
unremarkable.
IMPRESSION: No acute intracranial abnormality.

Atrophy, chronic microvascular disease.

## 2014-08-01 MED ORDER — SODIUM CHLORIDE 0.9 % IV BOLUS (SEPSIS)
1000.0000 mL | Freq: Once | INTRAVENOUS | Status: AC
  Administered 2014-08-01: 1000 mL via INTRAVENOUS

## 2014-08-01 NOTE — ED Provider Notes (Signed)
  Face-to-face evaluation   History: She presents for general malaise, with somewhat decreased appetite, weak feeling and one episode of vomiting. She recently had a birthday and was feeling well. Then, but since May 25, has felt poorly. She lives alone. She came here with family member.  Physical exam: Female, alert, cooperative and calm. Heart regular rate and rhythm, no murmur. Lungs crackles patient. An soft and nontender.  Medical screening examination/treatment/procedure(s) were conducted as a shared visit with non-physician practitioner(s) and myself.  I personally evaluated the patient during the encounter  Daleen Bo, MD 08/06/14 [phone]

## 2014-08-01 NOTE — ED Notes (Signed)
Pt reports decrease in energy, lack of appetite, vomited x 1. No pain. No fevers.

## 2014-08-01 NOTE — Discharge Instructions (Signed)
Please call your doctor for a followup appointment within 24-48 hours. When you talk to your doctor please let them know that you were seen in the emergency department and have them acquire all of your records so that they can discuss the findings with you and formulate a treatment plan to fully care for your new and ongoing problems. Please follow-up with your primary care provider this week to be seen and reassessed On your EKG, a left bundle branch block has been identified-this is something new. Please follow-up with your primary care provider regarding this. Please rest and stay hydrated Please continue to monitor symptoms closely and if symptoms are to worsen or change (fever greater than 101, chills, sweating, nausea, vomiting, chest pain, shortness of breathe, difficulty breathing, weakness, numbness, tingling, worsening or changes to pain pattern, fall, head injury, loss of sensation, blood in stools, black tarry stools, stomach pain, inability to keep food or fluids down, neck pain, neck stiffness, back pain, weakness or loss of sensation to the legs, inability to walk, confusion, disorientation, changes to behavior or mentation) please report back to the Emergency Department immediately.    Fatigue Fatigue is a feeling of tiredness, lack of energy, lack of motivation, or feeling tired all the time. Having enough rest, good nutrition, and reducing stress will normally reduce fatigue. Consult your caregiver if it persists. The nature of your fatigue will help your caregiver to find out its cause. The treatment is based on the cause.  CAUSES  There are many causes for fatigue. Most of the time, fatigue can be traced to one or more of your habits or routines. Most causes fit into one or more of three general areas. They are: Lifestyle problems  Sleep disturbances.  Overwork.  Physical exertion.  Unhealthy habits.  Poor eating habits or eating disorders.  Alcohol and/or drug use  .  Lack of proper nutrition (malnutrition). Psychological problems  Stress and/or anxiety problems.  Depression.  Grief.  Boredom. Medical Problems or Conditions  Anemia.  Pregnancy.  Thyroid gland problems.  Recovery from major surgery.  Continuous pain.  Emphysema or asthma that is not well controlled  Allergic conditions.  Diabetes.  Infections (such as mononucleosis).  Obesity.  Sleep disorders, such as sleep apnea.  Heart failure or other heart-related problems.  Cancer.  Kidney disease.  Liver disease.  Effects of certain medicines such as antihistamines, cough and cold remedies, prescription pain medicines, heart and blood pressure medicines, drugs used for treatment of cancer, and some antidepressants. SYMPTOMS  The symptoms of fatigue include:   Lack of energy.  Lack of drive (motivation).  Drowsiness.  Feeling of indifference to the surroundings. DIAGNOSIS  The details of how you feel help guide your caregiver in finding out what is causing the fatigue. You will be asked about your present and past health condition. It is important to review all medicines that you take, including prescription and non-prescription items. A thorough exam will be done. You will be questioned about your feelings, habits, and normal lifestyle. Your caregiver may suggest blood tests, urine tests, or other tests to look for common medical causes of fatigue.  TREATMENT  Fatigue is treated by correcting the underlying cause. For example, if you have continuous pain or depression, treating these causes will improve how you feel. Similarly, adjusting the dose of certain medicines will help in reducing fatigue.  HOME CARE INSTRUCTIONS   Try to get the required amount of good sleep every night.  Eat a healthy  and nutritious diet, and drink enough water throughout the day.  Practice ways of relaxing (including yoga or meditation).  Exercise regularly.  Make plans to  change situations that cause stress. Act on those plans so that stresses decrease over time. Keep your work and personal routine reasonable.  Avoid street drugs and minimize use of alcohol.  Start taking a daily multivitamin after consulting your caregiver. SEEK MEDICAL CARE IF:   You have persistent tiredness, which cannot be accounted for.  You have fever.  You have unintentional weight loss.  You have headaches.  You have disturbed sleep throughout the night.  You are feeling sad.  You have constipation.  You have dry skin.  You have gained weight.  You are taking any new or different medicines that you suspect are causing fatigue.  You are unable to sleep at night.  You develop any unusual swelling of your legs or other parts of your body. SEEK IMMEDIATE MEDICAL CARE IF:   You are feeling confused.  Your vision is blurred.  You feel faint or pass out.  You develop severe headache.  You develop severe abdominal, pelvic, or back pain.  You develop chest pain, shortness of breath, or an irregular or fast heartbeat.  You are unable to pass a normal amount of urine.  You develop abnormal bleeding such as bleeding from the rectum or you vomit blood.  You have thoughts about harming yourself or committing suicide.  You are worried that you might harm someone else. MAKE SURE YOU:   Understand these instructions.  Will watch your condition.  Will get help right away if you are not doing well or get worse. Document Released: 12/07/2006 Document Revised: 05/04/2011 Document Reviewed: 06/13/2013 [facility] Patient Information 2015 [place], [place]. This information is not intended to replace advice given to you by your health care provider. Make sure you discuss any questions you have with your health care provider.

## 2014-08-01 NOTE — ED Notes (Signed)
Pt has returned from CT. Attempting to provide urine sample.

## 2014-08-01 NOTE — ED Notes (Addendum)
Pt is able to tolerate fluids well and also ate a meal. Pt also ambulates independently and has a steady gait.

## 2014-08-01 NOTE — ED Notes (Signed)
X-ray at bedside

## 2014-08-01 NOTE — ED Notes (Signed)
Called lab to incur about troponin and cmp results. Spoke with lab, shows they received blood work, transferred to chemistry and unable to speak with anyone in dept.

## 2014-08-01 NOTE — ED Provider Notes (Signed)
CSN: [id number]     Arrival date & time 08/01/14  3016 History   First MD Initiated Contact with Patient 08/01/14 [phone]     Chief Complaint  Patient presents with  . Weakness     (Consider location/radiation/quality/duration/timing/severity/associated sxs/prior Treatment) The history is provided by the patient. No language interpreter was used.  Nicole Blackwell is a 79 y/o F with PMHx of back pain, osteoporosis, cholecystectomy and abdominal hysterectomy presenting to the ED with not feeling herself for the past couple of days. Patient reported that she "feeling funny." When asked patient what she means by this, patient reported that she just has not been feeling herself. Reported that she had 1-2 episodes of emesis last week. Reported that she has been feeling worn out and out of energy. Stated that she has been having decreased eating and no appetite, but states that she knows she has to eat. Reported that she does live at home alone. Denied chest pain, shortness of breath, difficulty breathing, leg swelling, abdominal pain, nausea, vomiting, diarrhea, melena, hematochezia, decreased urination, dysuria, fever, chills, hematochezia, headache, dizziness, blurred vision, sudden loss of vision, sick contacts, cough, nasal congestion, travel, changes to sleeping patterns, difficulty sleeping, change the medications. Denied increased depression, sadness, thoughts of hurting self or hurting others. PCP Dr. Burnice Logan  Past Medical History  Diagnosis Date  . BACK PAIN 04/30/2008  . OSTEOPOROSIS 03/11/2007  . PLANTAR FASCIITIS 11/16/2006  . SHINGLES 09/27/2007  . SYMPTOMATIC MENOPAUSAL/FEMALE CLIMACTERIC STATES 06/20/2007  . Positive PPD    Past Surgical History  Procedure Laterality Date  . Cholecystectomy    . Abdominal hysterectomy    . Cataract extraction    . Sigmoidoscopy     No family history on file. History  Substance Use Topics  . Smoking status: Never Smoker   . Smokeless tobacco: Never  Used  . Alcohol Use: No   OB History    No data available     Review of Systems    Allergies  Penicillin g benzathine and Penicillins  Home Medications   Prior to Admission medications   Medication Sig Start Date End Date Taking? Authorizing Provider  acetaminophen (TYLENOL) 500 MG tablet Take 500 mg by mouth every 6 (six) hours as needed for mild pain.   Yes Historical Provider, MD  aspirin 81 MG tablet Take 81 mg by mouth at bedtime.    Yes Historical Provider, MD  meloxicam (MOBIC) 7.5 MG tablet Take 1 tablet (7.5 mg total) by mouth daily. Patient not taking: Reported on 08/01/2014 09/13/13   Charlann Lange, PA-C   BP 160/80 mmHg  Pulse 67  Temp(Src) 98.6 F (37 C) (Oral)  Resp 17  Ht 5\' 8"  (1.727 m)  SpO2 100% Physical Exam  Constitutional: She is oriented to person, place, and time. She appears well-developed and well-nourished. No distress.  Patient sitting upright in bed, appears comfortable in no sign of distress  HENT:  Head: Normocephalic and atraumatic.  Mouth/Throat: Oropharynx is clear and moist. No oropharyngeal exudate.  Eyes: Conjunctivae and EOM are normal. Pupils are equal, round, and reactive to light. Right eye exhibits no discharge. Left eye exhibits no discharge.  Neck: Normal range of motion. Neck supple.  Cardiovascular: Normal rate, regular rhythm and normal heart sounds.  Exam reveals no friction rub.   No murmur heard. Pulses:      Radial pulses are 2+ on the right side, and 2+ on the left side.       Dorsalis pedis  pulses are 2+ on the right side, and 2+ on the left side.  Cap refill less than 3 seconds Negative swelling or pitting edema identified to lower extremities bilaterally  Pulmonary/Chest: Effort normal and breath sounds normal. No respiratory distress. She has no wheezes. She has no rales. She exhibits no tenderness.  Abdominal: Soft. Bowel sounds are normal. She exhibits no distension. There is no tenderness. There is no rebound and no  guarding.  Musculoskeletal: Normal range of motion.  Full ROM to upper and lower extremities without difficulty noted, negative ataxia noted.  Neurological: She is alert and oriented to person, place, and time. No cranial nerve deficit. She exhibits normal muscle tone. Coordination normal. GCS eye subscore is 4. GCS verbal subscore is 5. GCS motor subscore is 6.  Cranial nerves III-XII grossly intact Strength 5+/5+ to upper and lower extremities bilaterally with resistance applied, equal distribution noted Equal grip strength Patient is able to bring finger to nose bilaterally and then finger to this provider's finger and back to her nose without difficulty or ataxia Negative facial droop Negative slurred speech Negative aphasia Negative arm drift Fine motor skills intact Patient follows commands well Patient responds to questions appropriately  Skin: Skin is warm. No rash noted. She is not diaphoretic. No erythema.  Psychiatric: She has a normal mood and affect. Her behavior is normal. Thought content normal.  Nursing note and vitals reviewed.   ED Course  Procedures (including critical care time) Labs Review Labs Reviewed  CBC WITH DIFFERENTIAL/PLATELET - Abnormal; Notable for the following:    RBC 5.88 (*)    HCT 47.4 (*)    MCH 25.3 (*)    All other components within normal limits  COMPREHENSIVE METABOLIC PANEL - Abnormal; Notable for the following:    Total Bilirubin 1.4 (*)    GFR calc non Af Amer 58 (*)    All other components within normal limits  URINALYSIS, ROUTINE W REFLEX MICROSCOPIC (NOT AT [facility]) - Abnormal; Notable for the following:    Color, Urine AMBER (*)    APPearance CLOUDY (*)    Leukocytes, UA SMALL (*)    All other components within normal limits  URINE MICROSCOPIC-ADD ON - Abnormal; Notable for the following:    Bacteria, UA FEW (*)    All other components within normal limits  TROPONIN I  I-STAT TROPOININ, ED    Imaging Review Ct Head Wo  Contrast  08/01/2014   CLINICAL DATA:  Decreased energy level. Loss of appetite. Vomiting.  EXAM: CT HEAD WITHOUT CONTRAST  TECHNIQUE: Contiguous axial images were obtained from the base of the skull through the vertex without intravenous contrast.  COMPARISON:  01/09/2012  FINDINGS: There is atrophy and chronic small vessel disease changes. No acute intracranial abnormality. Specifically, no hemorrhage, hydrocephalus, mass lesion, acute infarction, or significant intracranial injury. No acute calvarial abnormality. Visualized paranasal sinuses and mastoids clear. Orbital soft tissues unremarkable.  IMPRESSION: No acute intracranial abnormality.  Atrophy, chronic microvascular disease.   Electronically Signed   By: Rolm Baptise M.D.   On: 08/01/2014 11:35   Dg Chest Port 1 View  08/01/2014   CLINICAL DATA:  79 year old female with chest pain and weakness since yesterday. Initial encounter.  EXAM: PORTABLE CHEST - 1 VIEW  COMPARISON:  06/30/2012 and earlier.  FINDINGS: Portable AP semi upright view at 0958 hours. Stable lung volumes. Stable cardiomegaly and mediastinal contours. Allowing for portable technique, the lungs are clear. No pneumothorax or pleural effusion.  IMPRESSION: No acute  cardiopulmonary abnormality.   Electronically Signed   By: Genevie Ann M.D.   On: 08/01/2014 10:07     EKG Interpretation   Date/Time:  Wednesday August 01 2014 10:11:51 EDT Ventricular Rate:  58 PR Interval:  168 QRS Duration: 154 QT Interval:  463 QTC Calculation: 455 R Axis:   -19 Text Interpretation:  Sinus rhythm Left bundle branch block Baseline  wander in lead(s) II Since last tracing LBBB is new Confirmed by Eulis Foster   MD, ELLIOTT ([id number]) on 08/01/2014 11:16:03 AM       11:14 AM Discussed case in great detail with attending physician, Dr. Crosby Oyster - as per physician reported that LBBB is new on the EKg. Reported that patient did have a LAFB noted on old EKG - reported that this could have led to the LBBB. Reported  that patient can still follow up as an outpatient with her PCP - does not warrant admission or explanation of patient's out of energy complaint.   3:04 PM this provider reassess the patient. Patient resting comfortably upright in bed in no sign of acute distress. Patient holding conversation well. Negative changes to mentation. Patient follows commands responds to questions appropriately. Patient reports that she ate a sandwich and had lunch, reports that she is feeling much better. Patient ambulated well around the ED without any difficulty-steady gait noted upon examination. Patient reports that she feels comfortable to go home. Negative episodes of emesis in ED setting. Discussed plan for discharge, patient agrees. Patient feels comfortable to go home, feels safe to go home. Denied depression, suicidal ideation, homicidal ideation, AVH.  MDM   Final diagnoses:  Other fatigue  LBBB (left bundle branch block)    Medications  sodium chloride 0.9 % bolus 1,000 mL (0 mLs Intravenous Stopped 08/01/14 1315)    Filed Vitals:   08/01/14 1415 08/01/14 1445 08/01/14 1500 08/01/14 1505  BP: 130/76 159/73 160/80 160/80  Pulse: 63 67 70 67  Temp:    98.6 F (37 C)  TempSrc:    Oral  Resp: 13 21 17 17   Height:      SpO2: 100% 100% 98% 100%   EKG normal sinus rhythm with a heart rate of 58 with a LBBB that appears to be new. Troponin negative elevation. Second i-STAT troponin negative elevation. CBC negative elevated leukocytosis. Hemoglobin 14.9, hematocrit 47.4. CMP unremarkable, mildly elevated bilirubin of 1.4 - when compared to 2 years ago patient had elevated bilirubin of 1.7 and 1.5. UA negative hemoglobin, nitrites-small leukocytes identified with a white blood cell count of 0-2-unremarkable. Chest x-ray no active cardiopulmonary disease noted. CT head without contrast no acute intracranial abnormalities noted. Atrophy, chronic microvascular disease noted on CT. Negative findings of UTI or  pyelonephritis. Negative findings of pneumonia. CT head without contrast unremarkable. Patient follows commands well. Patient responds to questions appropriately. Negative focal neurological deficits noted. Patient is been monitored in the ED setting without any changes to mentation. EKG noted mild change of left bundle branch block, reviewed with attending physician who agrees that patient didn't follow-up as outpatient with this. Patient stable, afebrile. Patient not septic appearing. Negative signs of respiratory distress. Patient does not appear harm to herself or others. Discharged patient. Referred patient to her primary care provider to be seen and reassessed regarding left bundle branch block. Discussed with patient to rest and stay hydrated. Discussed with patient to closely monitor symptoms and if symptoms are to worsen or change to report back to the ED - strict  return instructions given.  Patient agreed to plan of care, understood, all questions answered.   Jamse Mead, PA-C 08/01/14 1547  Daleen Bo, MD 08/06/14 [phone]

## 2014-08-01 NOTE — ED Notes (Signed)
Pt is in stable condition upon d/c and ambulates from ED. 

## 2014-08-01 NOTE — ED Notes (Signed)
Pt unable to void at this time. Pt doesn't wish to have I&O. Pt is drinking fluids and has fluids running and requests to have about 15 minutes to try again.

## 2014-08-10 ENCOUNTER — Encounter: Payer: Self-pay | Admitting: Internal Medicine

## 2014-08-10 ENCOUNTER — Ambulatory Visit (INDEPENDENT_AMBULATORY_CARE_PROVIDER_SITE_OTHER): Payer: Medicare Other | Admitting: Internal Medicine

## 2014-08-10 VITALS — BP 140/80 | HR 74 | Temp 98.5°F | Resp 20 | Ht 68.0 in | Wt 160.0 lb

## 2014-08-10 DIAGNOSIS — M810 Age-related osteoporosis without current pathological fracture: Secondary | ICD-10-CM

## 2014-08-10 DIAGNOSIS — R55 Syncope and collapse: Secondary | ICD-10-CM

## 2014-08-10 DIAGNOSIS — I447 Left bundle-branch block, unspecified: Secondary | ICD-10-CM

## 2014-08-10 DIAGNOSIS — R5383 Other fatigue: Secondary | ICD-10-CM

## 2014-08-10 NOTE — Progress Notes (Signed)
Subjective:    Patient ID: Nicole Blackwell, female    DOB: 04-25-1935, 79 y.o.   MRN: [id number]  HPI  79 year old patient who is seen today following an ED visit for fatigue.  This was associated with some nausea.  Evaluation was unremarkable except for a left bundle branch block which was new compared to prior tracings, which revealed only a left anterior hemiblock.  The patient does have a history of syncope, but not recently.  Evaluation has included a 21 day event monitor that was normal.  Denies any presyncopal symptoms.  Today she feels well.  She takes no chronic medications Laboratory studies were reviewed  Past Medical History  Diagnosis Date  . BACK PAIN 04/30/2008  . OSTEOPOROSIS 03/11/2007  . PLANTAR FASCIITIS 11/16/2006  . SHINGLES 09/27/2007  . SYMPTOMATIC MENOPAUSAL/FEMALE CLIMACTERIC STATES 06/20/2007  . Positive PPD     History   Social History  . Marital Status: Divorced    Spouse Name: N/A  . Number of Children: N/A  . Years of Education: N/A   Occupational History  . Not on file.   Social History Main Topics  . Smoking status: Never Smoker   . Smokeless tobacco: Never Used  . Alcohol Use: No  . Drug Use: No  . Sexual Activity: Not on file   Other Topics Concern  . Not on file   Social History Narrative    Past Surgical History  Procedure Laterality Date  . Cholecystectomy    . Abdominal hysterectomy    . Cataract extraction    . Sigmoidoscopy      No family history on file.  Allergies  Allergen Reactions  . Penicillin G Benzathine Swelling  . Penicillins Swelling    Current Outpatient Prescriptions on File Prior to Visit  Medication Sig Dispense Refill  . acetaminophen (TYLENOL) 500 MG tablet Take 500 mg by mouth every 6 (six) hours as needed for mild pain.    Marland Kitchen aspirin 81 MG tablet Take 81 mg by mouth at bedtime.      No current facility-administered medications on file prior to visit.    BP 140/80 mmHg  Pulse 74  Temp(Src) 98.5 F  (36.9 C) (Oral)  Resp 20  Ht 5\' 8"  (1.727 m)  Wt 160 lb (72.576 kg)  BMI 24.33 kg/m2  SpO2 99%     Review of Systems  Constitutional: Positive for fatigue. Negative for fever, appetite change and unexpected weight change.  HENT: Negative for congestion, dental problem, ear pain, hearing loss, mouth sores, nosebleeds, sinus pressure, sore throat, tinnitus, trouble swallowing and voice change.   Eyes: Negative for photophobia, pain, redness and visual disturbance.  Respiratory: Negative for cough, chest tightness and shortness of breath.   Cardiovascular: Negative for chest pain, palpitations and leg swelling.  Gastrointestinal: Negative for nausea, vomiting, abdominal pain, diarrhea, constipation, blood in stool, abdominal distention and rectal pain.  Genitourinary: Negative for dysuria, urgency, frequency, hematuria, flank pain, vaginal bleeding, vaginal discharge, difficulty urinating, genital sores, vaginal pain, menstrual problem and pelvic pain.  Musculoskeletal: Negative for back pain, arthralgias and neck stiffness.  Skin: Negative for rash.  Neurological: Negative for dizziness, syncope, speech difficulty, weakness, light-headedness, numbness and headaches.  Hematological: Negative for adenopathy. Does not bruise/bleed easily.  Psychiatric/Behavioral: Negative for suicidal ideas, behavioral problems, self-injury, dysphoric mood and agitation. The patient is not nervous/anxious.        Objective:   Physical Exam  Constitutional: She is oriented to person, place, and time. She  appears well-developed and well-nourished.  HENT:  Head: Normocephalic.  Right Ear: External ear normal.  Left Ear: External ear normal.  Mouth/Throat: Oropharynx is clear and moist.  Eyes: Conjunctivae and EOM are normal. Pupils are equal, round, and reactive to light.  Neck: Normal range of motion. Neck supple. No thyromegaly present.  Cardiovascular: Normal rate, regular rhythm, normal heart sounds  and intact distal pulses.   Split S2  Pulmonary/Chest: Effort normal and breath sounds normal.  Abdominal: Soft. Bowel sounds are normal. She exhibits no mass. There is no tenderness.  Musculoskeletal: Normal range of motion.  Lymphadenopathy:    She has no cervical adenopathy.  Neurological: She is alert and oriented to person, place, and time.  Skin: Skin is warm and dry. No rash noted.  Psychiatric: She has a normal mood and affect. Her behavior is normal.          Assessment & Plan:   History of fatigue, resolved Left bundle-branch block History of syncope  CPX in 6 months.  Patient report any new or worsening symptoms

## 2014-08-10 NOTE — Progress Notes (Signed)
Pre visit review using our clinic review tool, if applicable. No additional management support is needed unless otherwise documented below in the visit note. 

## 2014-08-10 NOTE — Patient Instructions (Signed)
.  Call or return to clinic prn if these symptoms worsen or fail to improve as anticipated.  Return in 6 months for follow-up

## 2014-12-13 ENCOUNTER — Observation Stay (HOSPITAL_COMMUNITY)
Admission: EM | Admit: 2014-12-13 | Discharge: 2014-12-14 | Disposition: A | Discharge status: Home / Self Care | Payer: Medicare Other | Attending: Internal Medicine | Admitting: Internal Medicine

## 2014-12-13 ENCOUNTER — Emergency Department (HOSPITAL_COMMUNITY): Payer: Medicare Other

## 2014-12-13 ENCOUNTER — Encounter (HOSPITAL_COMMUNITY): Payer: Self-pay | Admitting: Emergency Medicine

## 2014-12-13 ENCOUNTER — Observation Stay (HOSPITAL_COMMUNITY): Payer: Medicare Other

## 2014-12-13 DIAGNOSIS — R1110 Vomiting, unspecified: Secondary | ICD-10-CM | POA: Diagnosis not present

## 2014-12-13 DIAGNOSIS — R402411 Glasgow coma scale score 13-15, in the field [EMT or ambulance]: Secondary | ICD-10-CM | POA: Diagnosis not present

## 2014-12-13 DIAGNOSIS — M810 Age-related osteoporosis without current pathological fracture: Secondary | ICD-10-CM | POA: Diagnosis not present

## 2014-12-13 DIAGNOSIS — R55 Syncope and collapse: Secondary | ICD-10-CM | POA: Diagnosis not present

## 2014-12-13 DIAGNOSIS — Z880 Allergy status to penicillin: Secondary | ICD-10-CM | POA: Diagnosis not present

## 2014-12-13 DIAGNOSIS — Z8742 Personal history of other diseases of the female genital tract: Secondary | ICD-10-CM | POA: Insufficient documentation

## 2014-12-13 DIAGNOSIS — Z7982 Long term (current) use of aspirin: Secondary | ICD-10-CM | POA: Diagnosis not present

## 2014-12-13 DIAGNOSIS — I5030 Unspecified diastolic (congestive) heart failure: Secondary | ICD-10-CM

## 2014-12-13 DIAGNOSIS — I5032 Chronic diastolic (congestive) heart failure: Secondary | ICD-10-CM

## 2014-12-13 DIAGNOSIS — R112 Nausea with vomiting, unspecified: Secondary | ICD-10-CM | POA: Insufficient documentation

## 2014-12-13 DIAGNOSIS — Z8619 Personal history of other infectious and parasitic diseases: Secondary | ICD-10-CM | POA: Diagnosis not present

## 2014-12-13 DIAGNOSIS — I519 Heart disease, unspecified: Secondary | ICD-10-CM

## 2014-12-13 HISTORY — DX: Malignant neoplasm of uterus, part unspecified: C55

## 2014-12-13 HISTORY — DX: Syncope and collapse: R55

## 2014-12-13 LAB — URINALYSIS, ROUTINE W REFLEX MICROSCOPIC
BILIRUBIN URINE: NEGATIVE
Glucose, UA: NEGATIVE mg/dL
HGB URINE DIPSTICK: NEGATIVE
Ketones, ur: NEGATIVE mg/dL
Leukocytes, UA: NEGATIVE
Nitrite: NEGATIVE
PROTEIN: NEGATIVE mg/dL
Specific Gravity, Urine: 1.017 (ref 1.005–1.030)
Urobilinogen, UA: 1 mg/dL (ref 0.0–1.0)
pH: 7.5 (ref 5.0–8.0)

## 2014-12-13 LAB — CBC
HCT: 46.2 % — ABNORMAL HIGH (ref 36.0–46.0)
Hemoglobin: 14.5 g/dL (ref 12.0–15.0)
MCH: 25.8 pg — ABNORMAL LOW (ref 26.0–34.0)
MCHC: 31.4 g/dL (ref 30.0–36.0)
MCV: 82.4 fL (ref 78.0–100.0)
Platelets: 183 10*3/uL (ref 150–400)
RBC: 5.61 MIL/uL — ABNORMAL HIGH (ref 3.87–5.11)
RDW: 14.9 % (ref 11.5–15.5)
WBC: 7 10*3/uL (ref 4.0–10.5)

## 2014-12-13 LAB — BASIC METABOLIC PANEL
Anion gap: 8 (ref 5–15)
BUN: 9 mg/dL (ref 6–20)
CALCIUM: 9.1 mg/dL (ref 8.9–10.3)
CO2: 27 mmol/L (ref 22–32)
CREATININE: 0.92 mg/dL (ref 0.44–1.00)
Chloride: 106 mmol/L (ref 101–111)
GFR calc Af Amer: >60 mL/min (ref >60)
GFR, EST NON AFRICAN AMERICAN: 59 mL/min — AB (ref >60)
GLUCOSE: 117 mg/dL — AB (ref 65–99)
Potassium: 3.9 mmol/L (ref 3.5–5.1)
Sodium: 141 mmol/L (ref 135–145)

## 2014-12-13 LAB — CBG MONITORING, ED: Glucose-Capillary: 106 mg/dL — ABNORMAL HIGH (ref 65–99)

## 2014-12-13 LAB — I-STAT TROPONIN, ED: Troponin i, poc: 0.01 ng/mL (ref 0.00–0.08)

## 2014-12-13 IMAGING — CR DG CHEST 2V
2 series · 2 of 2 positions shown · non-contrast
Comparison: [DATE]

CLINICAL DATA: Syncope

EXAM:
CHEST  2 VIEW

[chest pa]
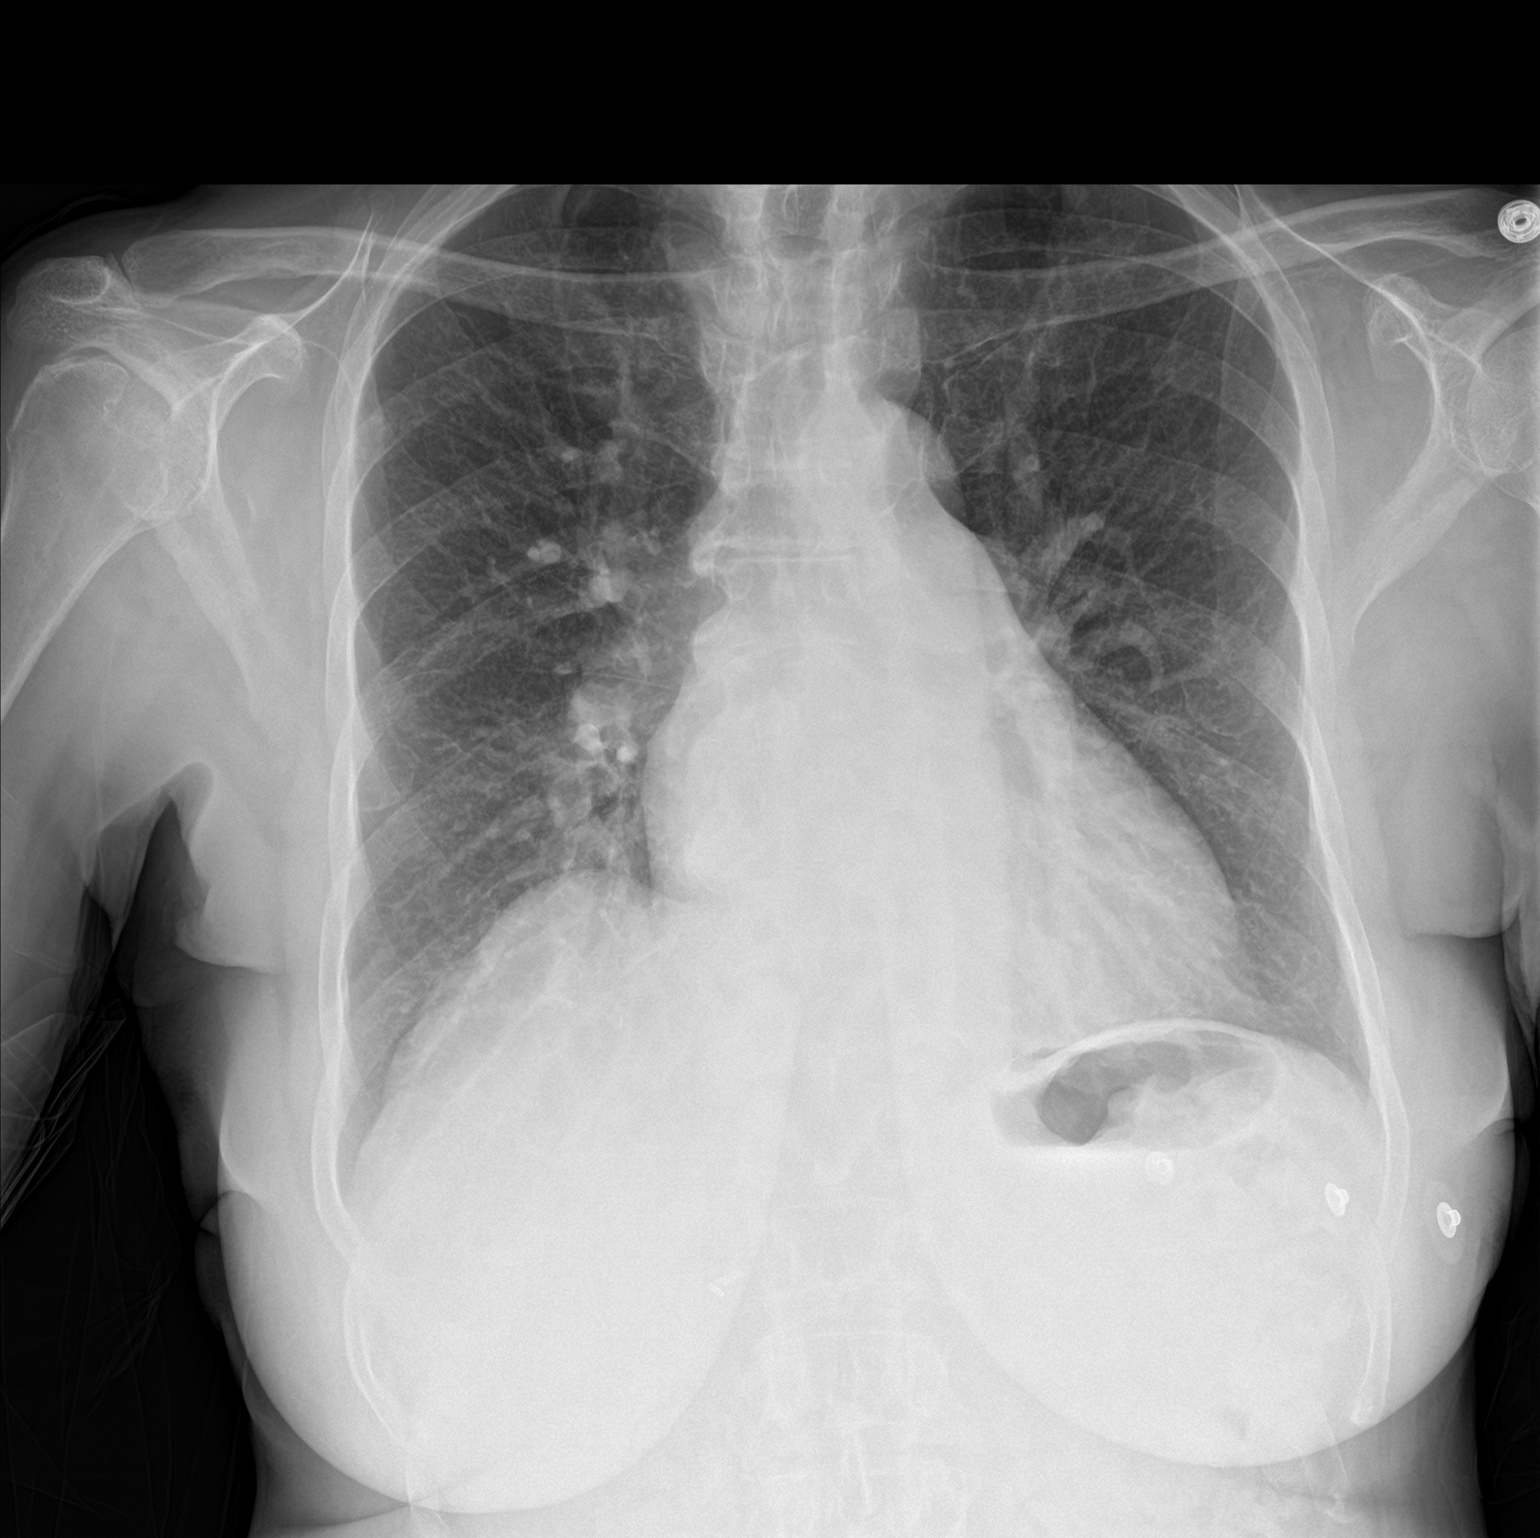

[chest lat]
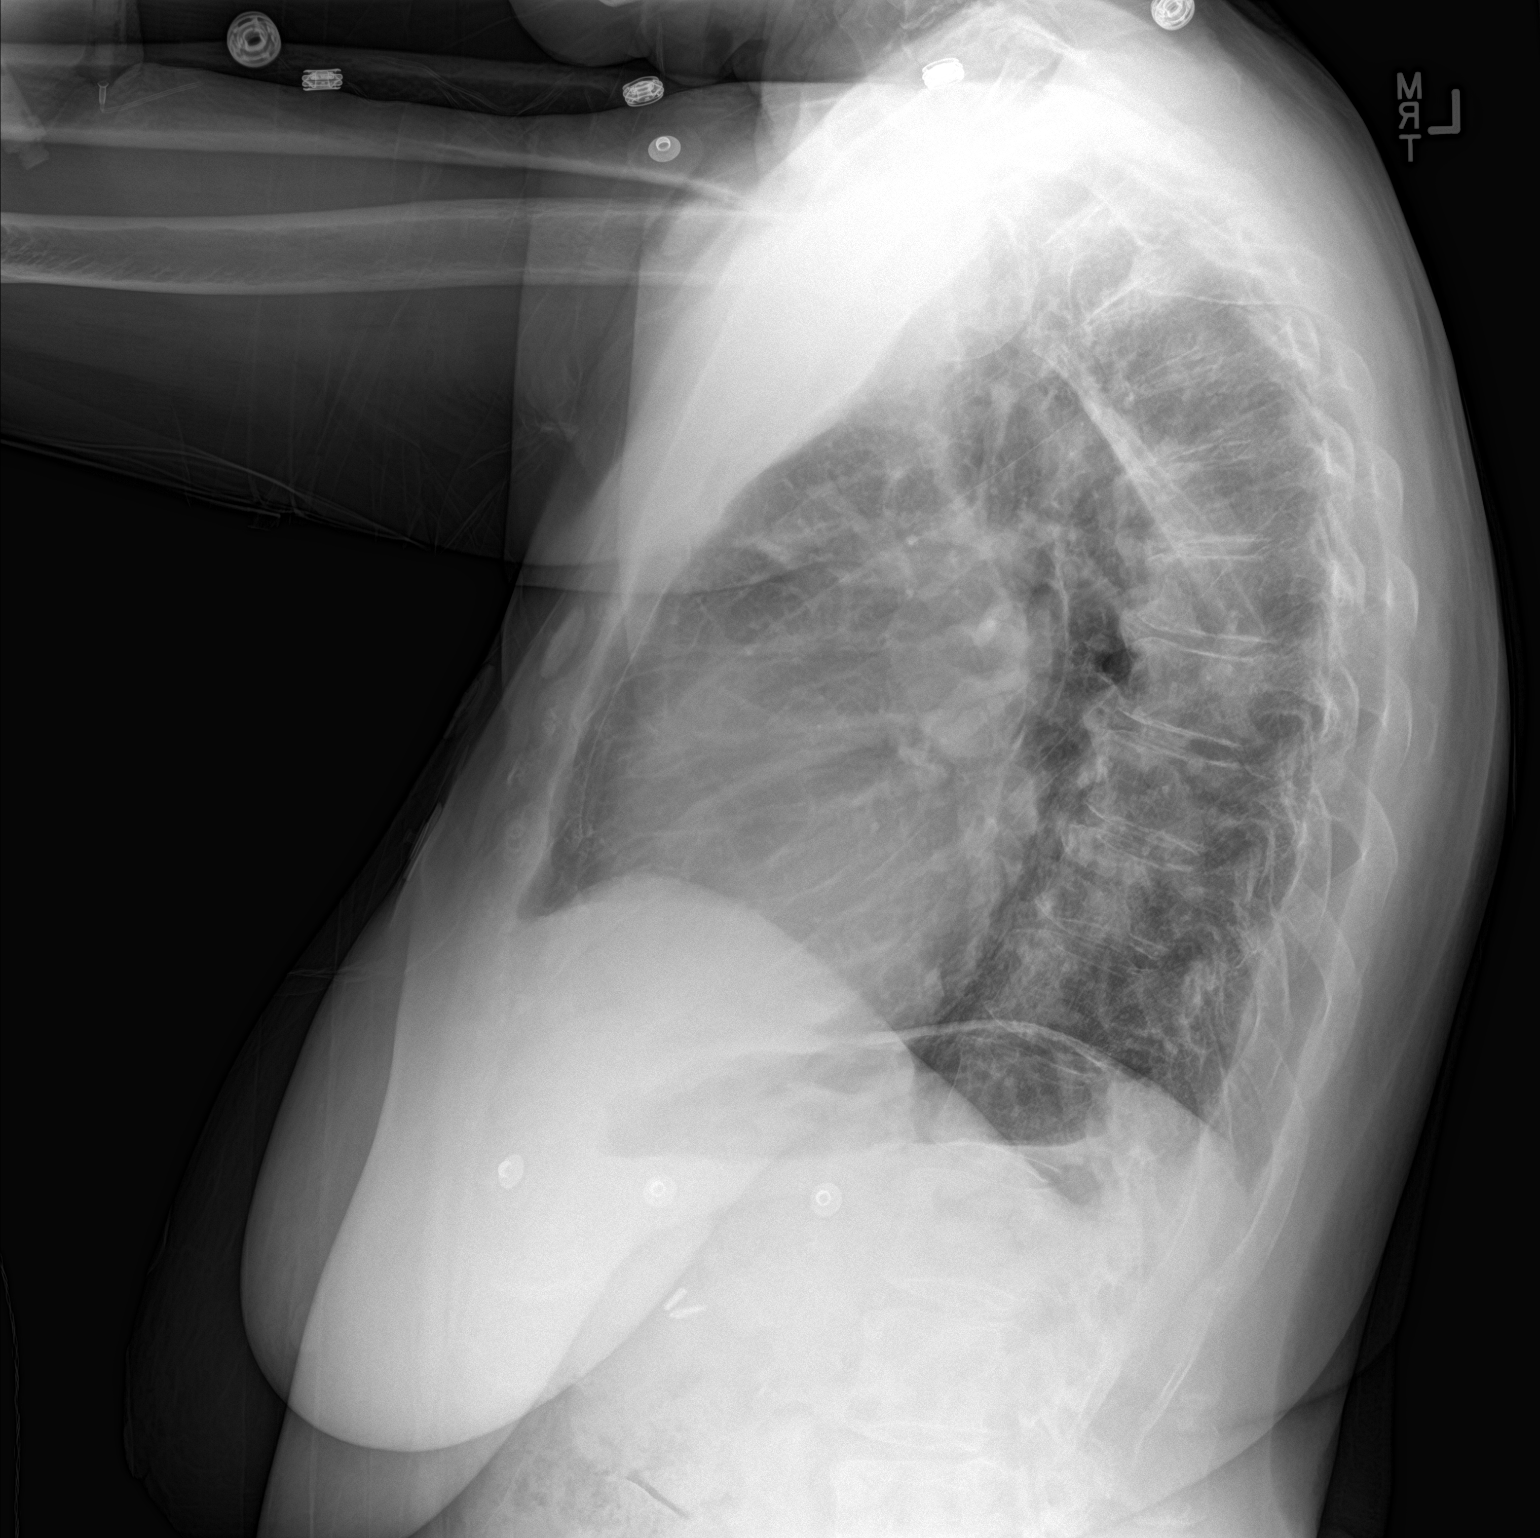

[2 of 2 positions shown; findings below may reference images not displayed]

FINDINGS: Chronic mild cardiomegaly. Stable aortic and hilar contours, with
prominent pulmonary artery shadows, potential pulmonary
hypertension. There is no edema, consolidation, effusion, or
pneumothorax. No acute osseous findings.
IMPRESSION: No acute finding or change from prior.

## 2014-12-13 IMAGING — CT CT HEAD W/O CM
1 series · 16 of 29 positions shown, 20 images · non-contrast
Comparison: [DATE]

CLINICAL DATA: Syncope

EXAM:
CT HEAD WITHOUT CONTRAST
TECHNIQUE: Contiguous axial images were obtained from the base of the skull
through the vertex without intravenous contrast.

[Series 2: head 5.0 h30s · axial · 0.43mm/px · z∈[-23,+107]mm · 16 of 29 slices shown, 20 images]
[im 2/29  brain]
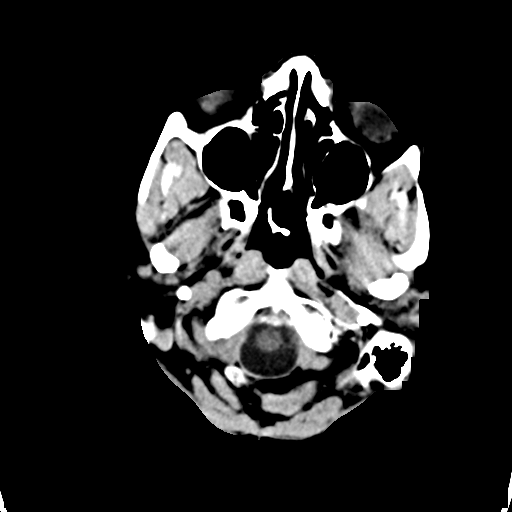
[im 2/29  bone]
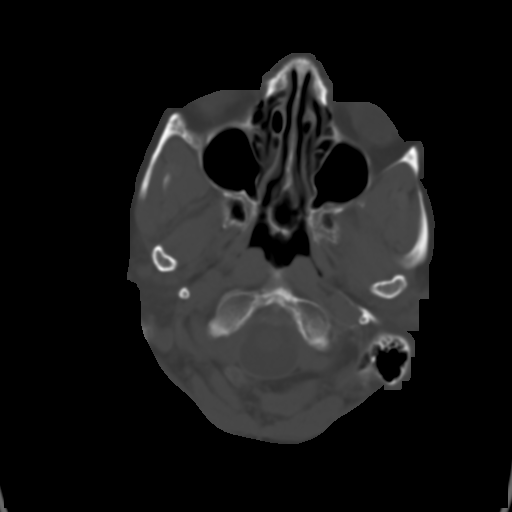
[im 4/29  brain]
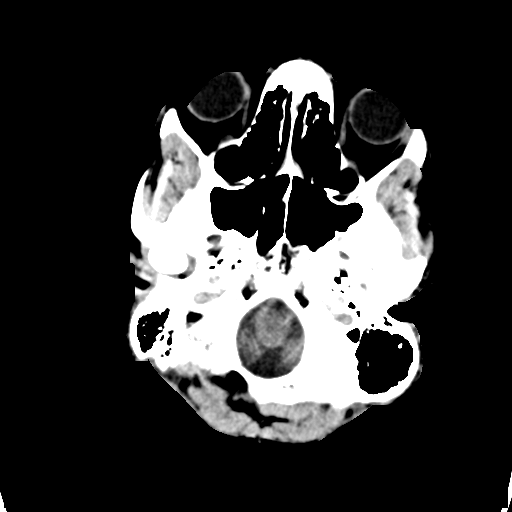
[im 6/29  brain]
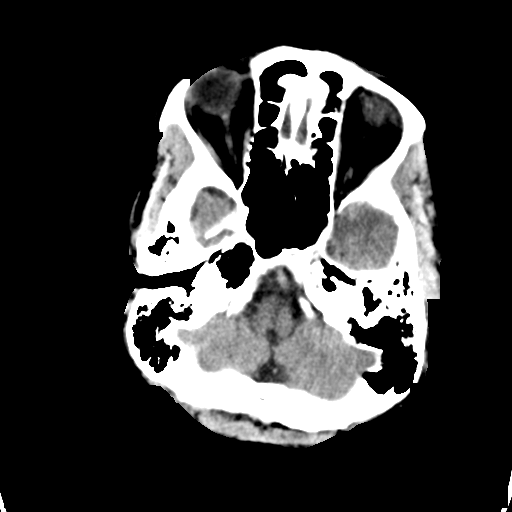
[im 7/29  brain]
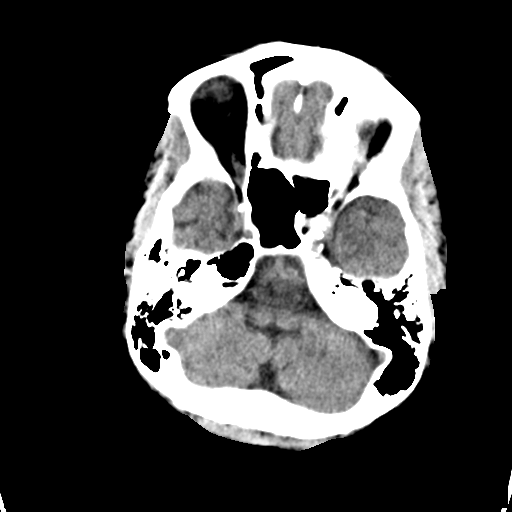
[im 9/29  brain]
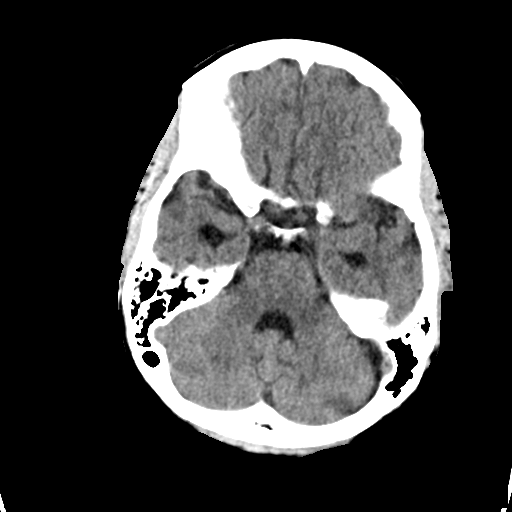
[im 9/29  bone]
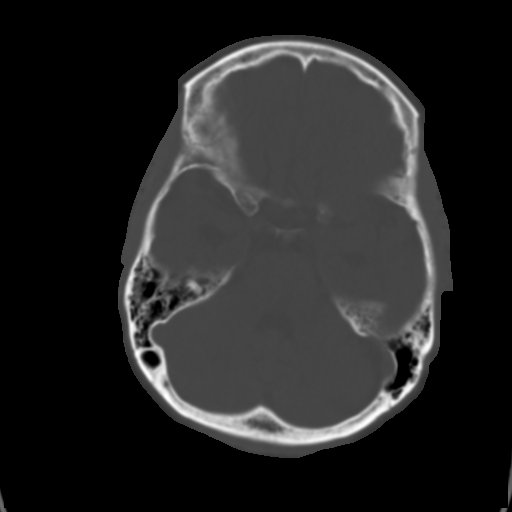
[im 11/29  brain]
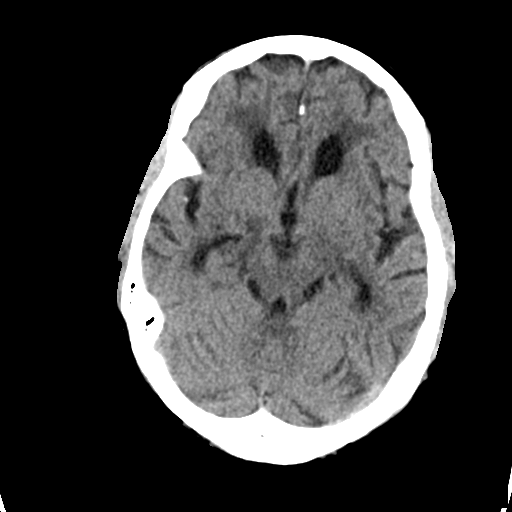
[im 12/29  brain]
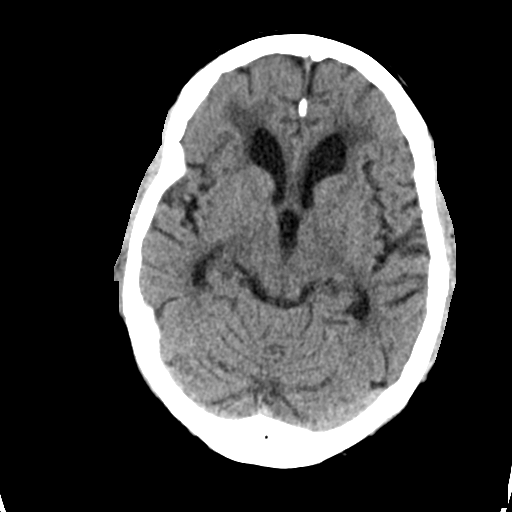
[im 14/29  brain]
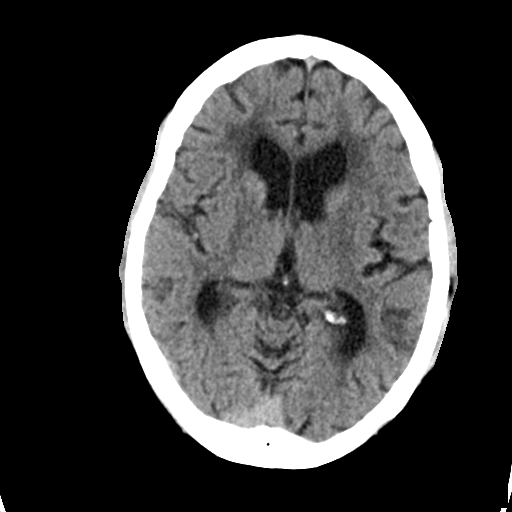
[im 16/29  brain]
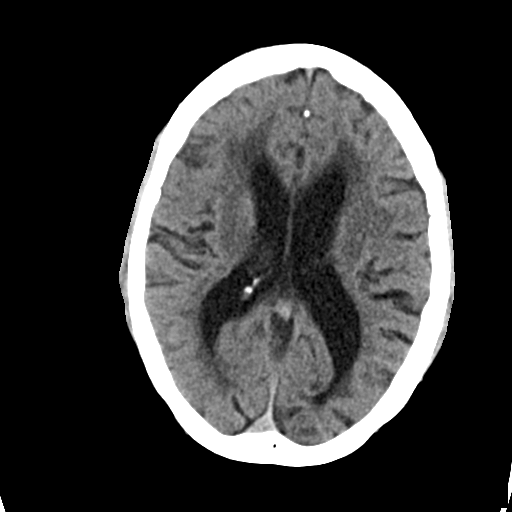
[im 16/29  bone]
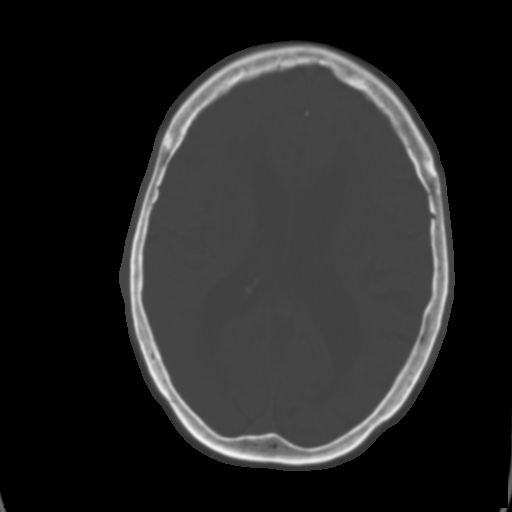
[im 18/29  brain]
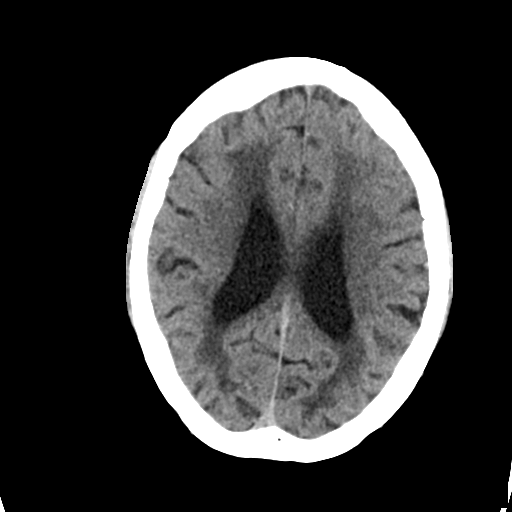
[im 19/29  brain]
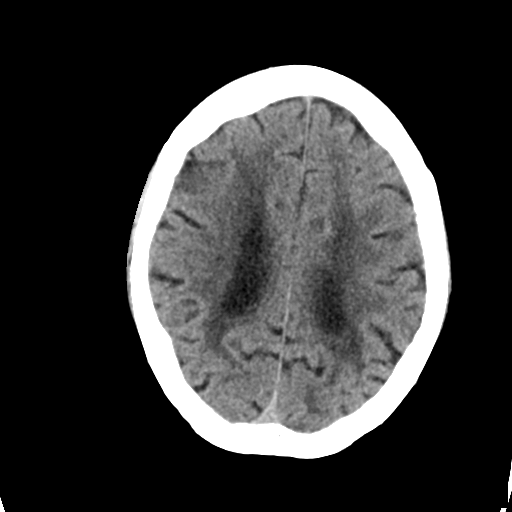
[im 21/29  brain]
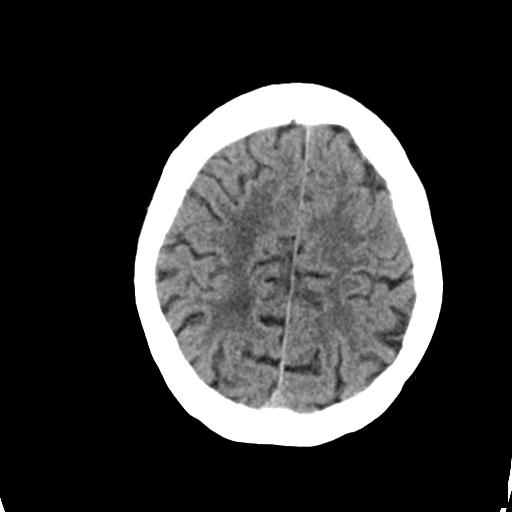
[im 23/29  brain]
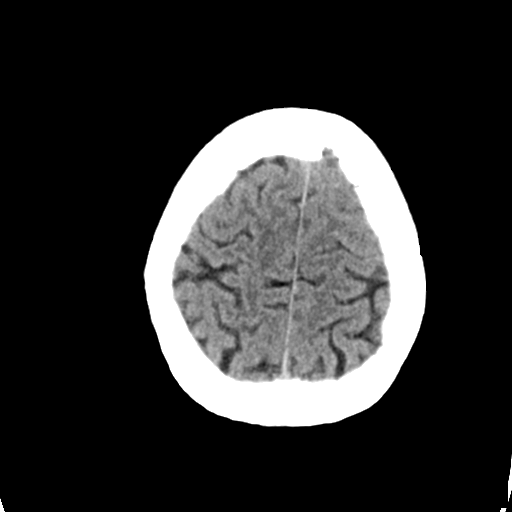
[im 23/29  bone]
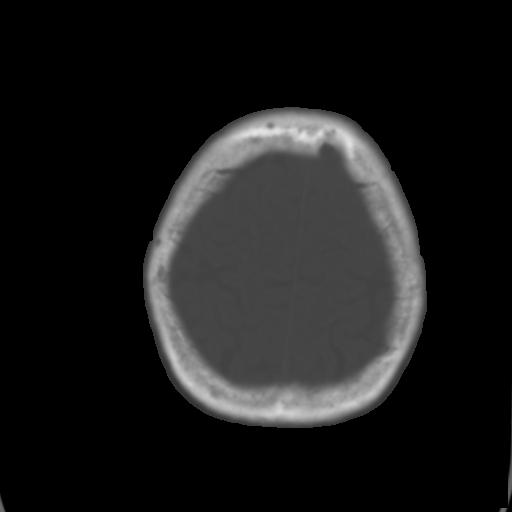
[im 24/29  brain]
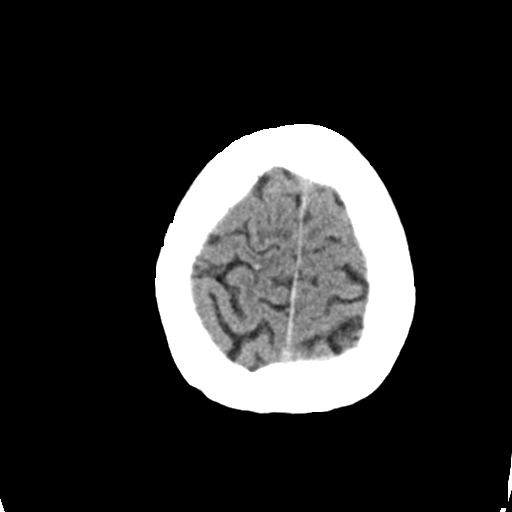
[im 26/29  brain]
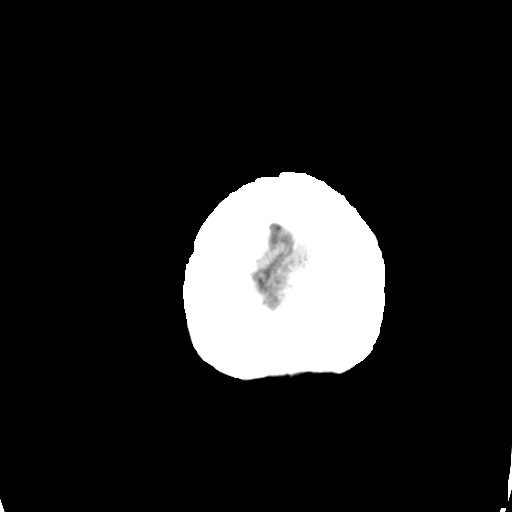
[im 28/29  brain]
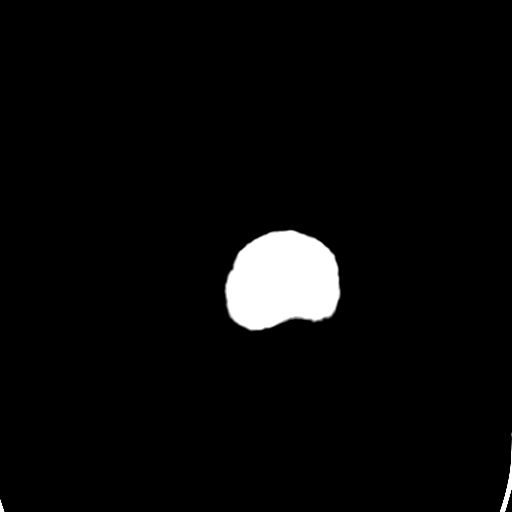

[16 of 29 positions shown; findings below may reference images not displayed]

FINDINGS: Skull and Sinuses:Negative for fracture or destructive process. The
mastoids, middle ears, and imaged paranasal sinuses are clear.

Orbits: Bilateral cataract resection.  No acute/ traumatic finding.

Brain: No evidence of acute infarction, hemorrhage, hydrocephalus,
or mass lesion/mass effect.

Stable pattern of moderate for age chronic small vessel disease,
confluent in the deep cerebral white matter. Generalized cortical
atrophy, age congruent. Few peripheral/sulcal calcifications,
considered incidental.
IMPRESSION: 1. No acute finding.
2. Moderate chronic small vessel disease.

## 2014-12-13 MED ORDER — SODIUM CHLORIDE 0.9 % IV BOLUS (SEPSIS)
1000.0000 mL | Freq: Once | INTRAVENOUS | Status: AC
  Administered 2014-12-13: 1000 mL via INTRAVENOUS

## 2014-12-13 MED ORDER — ENOXAPARIN SODIUM 40 MG/0.4ML ~~LOC~~ SOLN
40.0000 mg | SUBCUTANEOUS | Status: DC
  Administered 2014-12-13: 40 mg via SUBCUTANEOUS
  Filled 2014-12-13: qty 0.4

## 2014-12-13 MED ORDER — SODIUM CHLORIDE 0.9 % IV SOLN
INTRAVENOUS | Status: DC
  Administered 2014-12-13: 22:00:00 via INTRAVENOUS

## 2014-12-13 MED ORDER — ONDANSETRON HCL 4 MG PO TABS: 4.0000 mg | ORAL_TABLET | Freq: Four times a day (QID) | ORAL | Status: DC | PRN

## 2014-12-13 MED ORDER — WHITE PETROLATUM GEL
Status: AC
Start: 2014-12-13 — End: 2014-12-14
  Filled 2014-12-13: qty 1

## 2014-12-13 MED ORDER — SODIUM CHLORIDE 0.9 % IV SOLN: INTRAVENOUS | Status: DC

## 2014-12-13 MED ORDER — ACETAMINOPHEN 325 MG PO TABS: 650.0000 mg | ORAL_TABLET | Freq: Four times a day (QID) | ORAL | Status: DC | PRN

## 2014-12-13 MED ORDER — ONDANSETRON HCL 4 MG/2ML IJ SOLN: 4.0000 mg | Freq: Four times a day (QID) | INTRAMUSCULAR | Status: DC | PRN

## 2014-12-13 MED ORDER — ALUM & MAG HYDROXIDE-SIMETH 200-200-20 MG/5ML PO SUSP: 30.0000 mL | Freq: Four times a day (QID) | ORAL | Status: DC | PRN

## 2014-12-13 MED ORDER — ACETAMINOPHEN 650 MG RE SUPP: 650.0000 mg | Freq: Four times a day (QID) | RECTAL | Status: DC | PRN

## 2014-12-13 NOTE — H&P (Addendum)
[facility] History and Physical  Nicole Blackwell ZOX:[id number] DOB: [birth year] DOA: 12/13/2014   PCP: Nyoka Cowden, MD    Chief Complaint: Passed out  HPI: Nicole Blackwell is a 79 y.o. female with a history of syncope who presents after passing out in church today. The patient states that she felt tired today and felt like she needed to sit down but continued to help out at church. The syncopal episode was witnessed by other people at the church. There was no seizure-like activity noted. No loss of bowel or bladder control. When she woke up she recalls the EMS was there. She did not feel any dizziness, palpitations, shortness of breath, chest pain or focal numbness or weakness prior to or after the syncopal episode. She had 2 episodes of vomiting near the time she arrived to the ER. Currently she is asymptomatic. She recalls that she has passed out at least 3 times in the past- the last episode was over a year ago. She has previously had a workup in 2013 with 2-D echo and a head CT. Head CT was repeated this June when she came to the ER for generalized weakness. There were no significant findings on the head CT. Prior echo revealed grade 2 diastolic dysfunction.   General: The patient denies anorexia, fever, weight loss Cardiac: Denies chest pain, palpitations, pedal edema  Respiratory: Denies cough, shortness of breath, wheezing GI: Denies severe indigestion/heartburn, abdominal pain, nausea, vomiting, diarrhea and constipation GU: Denies hematuria, incontinence, dysuria  Musculoskeletal: Denies arthritis  Skin: Denies suspicious skin lesions Neurologic: Denies focal weakness or numbness, change in vision Psychiatry: Denies depression or anxiety. Hematologic: no easy bruising or bleeding  All other systems reviewed and found to be negative.  Past Medical History  Diagnosis Date  . BACK PAIN 04/30/2008  . OSTEOPOROSIS 03/11/2007  . PLANTAR FASCIITIS 11/16/2006  . SHINGLES  09/27/2007  . SYMPTOMATIC MENOPAUSAL/FEMALE CLIMACTERIC STATES 06/20/2007  . Positive PPD     Past Surgical History  Procedure Laterality Date  . Cholecystectomy    . Abdominal hysterectomy    . Cataract extraction    . Sigmoidoscopy      Social History: does not smoke or drink alcohol Lives at alone, can manage ALDs    Allergies  Allergen Reactions  . Penicillin G Benzathine Swelling  . Penicillins Swelling    Family history:  Father had heart trouble, Mother had cancer "in her chest"    Prior to Admission medications   Medication Sig Start Date End Date Taking? Authorizing Provider  acetaminophen (TYLENOL) 500 MG tablet Take 500 mg by mouth every 6 (six) hours as needed for mild pain.   Yes Historical Provider, MD  aspirin 81 MG tablet Take 81 mg by mouth at bedtime.    Yes Historical Provider, MD     Physical Exam: Filed Vitals:   12/13/14 1004 12/13/14 1015 12/13/14 1030 12/13/14 1034  BP: 138/64 136/67 115/69 125/64  Pulse:  55 65   Temp: 97.6 F (36.4 C)     TempSrc: Oral     Resp: 17 16 17    Height:      Weight:      SpO2: 100% 100% 100%      General: Elderly female sitting up in bed in no acute distress HEENT: Normocephalic and Atraumatic, Mucous membranes pink                PERRLA; EOM intact; No scleral icterus,  Nares: Patent, Oropharynx: Clear, Fair Dentition                 Neck: FROM, no cervical lymphadenopathy, thyromegaly, carotid bruit or JVD;  Breasts: deferred CHEST WALL: No tenderness  CHEST: Normal respiration, clear to auscultation bilaterally  HEART: Regular rate and rhythm; no murmurs rubs or gallops  BACK: No kyphosis or scoliosis; no CVA tenderness  GI: Positive Bowel Sounds, soft, non-tender; no masses, no organomegaly Rectal Exam: deferred MSK: No cyanosis, clubbing, or edema Genitalia: not examined  SKIN:  no rash or ulceration  CNS: Alert and Oriented x 4, Nonfocal exam, CN 2-12 intact  Labs on Admission:   Basic Metabolic Panel:  Recent Labs Lab 12/13/14 1105  NA 141  K 3.9  CL 106  CO2 27  GLUCOSE 117*  BUN 9  CREATININE 0.92  CALCIUM 9.1   Liver Function Tests: No results for input(s): AST, ALT, ALKPHOS, BILITOT, PROT, ALBUMIN in the last 168 hours. No results for input(s): LIPASE, AMYLASE in the last 168 hours. No results for input(s): AMMONIA in the last 168 hours. CBC:  Recent Labs Lab 12/13/14 1105  WBC 7.0  HGB 14.5  HCT 46.2*  MCV 82.4  PLT 183   Cardiac Enzymes: No results for input(s): CKTOTAL, CKMB, CKMBINDEX, TROPONINI in the last 168 hours.  BNP (last 3 results) No results for input(s): BNP in the last 8760 hours.  ProBNP (last 3 results) No results for input(s): PROBNP in the last 8760 hours.  CBG:  Recent Labs Lab 12/13/14 1024  GLUCAP 106*    Radiological Exams on Admission: Dg Chest 2 View  12/13/2014  CLINICAL DATA:  Syncope EXAM: CHEST  2 VIEW COMPARISON:  08/01/2014 FINDINGS: Chronic mild cardiomegaly. Stable aortic and hilar contours, with prominent pulmonary artery shadows, potential pulmonary hypertension. There is no edema, consolidation, effusion, or pneumothorax. No acute osseous findings. IMPRESSION: No acute finding or change from prior. Electronically Signed   By: Monte Fantasia M.D.   On: 12/13/2014 11:25    EKG: Independently reviewed. Sinus bradycardia with LBBB  Assessment/Plan Active Problems:   Syncope - mildly positive orthostatic vitals in the ER-patient did not feel dizzy when she was stood up to check these vitals- continue IV fluids and follow orthostatic vitals- would not give her fluids beyond 12 hours unless orthostatics are positive since she has diastolic dysfunction noted on prior echo -Does not appear that it was vasovagal as the vomiting occurred when she arrived to the ER -Repeat 2-D echo and head CT today; follow on telemetry; may need to consider event monitor prior to discharge  Diastolic heart  failure -2-D echo in 2013 revealed grade 2 diastolic dysfunction-she is not chronically on diuretics, ace inhibitor or beta blocker -Repeat echo   Consulted:   Code Status: Full code Family Communication:  Daughter- Joesph July: [phone]-if unable to reach her please call Truddie Coco [phone] DVT Prophylaxis: Will Knox  Time spent: 6 min  [place], MD [facility]  If 7PM-7AM, please contact night-coverage [email or web address] 12/13/2014, 1:30 PM

## 2014-12-13 NOTE — ED Provider Notes (Signed)
CSN: [id number]     Arrival date & time 12/13/14  2505 History   First MD Initiated Contact with Patient 12/13/14 1005     Chief Complaint  Patient presents with  . Loss of Consciousness  . Emesis     (Consider location/radiation/quality/duration/timing/severity/associated sxs/prior Treatment) Patient is a 79 y.o. female presenting with syncope and vomiting.  Loss of Consciousness Associated symptoms: vomiting   Emesis  MELODY CIRRINCIONE is a 79 y.o. female with PMH significant for back pain, osteoporosis, and plantar fasciitis who presents with a witnessed syncopal episode.  Per EMS, witnessed syncopal episode that lasted 30 minutes.  Patient was assisted to the floor.  No injury.  Patient denies any prodromal symptoms prior to LOC.  Patient reports she had been standing for "quite a while, at least over an hour".  She states that this has happened in the past, and she usually will sit down and rest before standing back up.  She has no complaints at this time.  Patient denies fevers, chills, dizziness, lightheadedness, headache, unilateral weakness, facial droop, slurred speech, visual disturbances, CP, diaphoresis, SOB, urinary symptoms, vaginal complaints.  EMS also reports she was orthostatic.   Past Medical History  Diagnosis Date  . BACK PAIN 04/30/2008  . OSTEOPOROSIS 03/11/2007  . PLANTAR FASCIITIS 11/16/2006  . SHINGLES 09/27/2007  . SYMPTOMATIC MENOPAUSAL/FEMALE CLIMACTERIC STATES 06/20/2007  . Positive PPD    Past Surgical History  Procedure Laterality Date  . Cholecystectomy    . Abdominal hysterectomy    . Cataract extraction    . Sigmoidoscopy     No family history on file. Social History  Substance Use Topics  . Smoking status: Never Smoker   . Smokeless tobacco: Never Used  . Alcohol Use: No   OB History    No data available     Review of Systems  Cardiovascular: Positive for syncope.  Gastrointestinal: Positive for vomiting.   All other systems negative unless  otherwise stated in HPI   Allergies  Penicillin g benzathine and Penicillins  Home Medications   Prior to Admission medications   Medication Sig Start Date End Date Taking? Authorizing Provider  acetaminophen (TYLENOL) 500 MG tablet Take 500 mg by mouth every 6 (six) hours as needed for mild pain.   Yes Historical Provider, MD  aspirin 81 MG tablet Take 81 mg by mouth at bedtime.    Yes Historical Provider, MD   BP 125/64 mmHg  Pulse 65  Temp(Src) 97.6 F (36.4 C) (Oral)  Resp 17  Ht 5\' 9"  (1.753 m)  Wt 160 lb (72.576 kg)  BMI 23.62 kg/m2  SpO2 100% Physical Exam  Constitutional: She is oriented to person, place, and time. She appears well-developed and well-nourished.  HENT:  Head: Normocephalic and atraumatic.  Mouth/Throat: Oropharynx is clear and moist.  Eyes: Conjunctivae are normal. Pupils are equal, round, and reactive to light.  Neck: Normal range of motion. Neck supple.  Cardiovascular: Normal rate, regular rhythm and normal heart sounds.   No murmur heard. Pulmonary/Chest: Effort normal and breath sounds normal. No accessory muscle usage or stridor. No respiratory distress. She has no wheezes. She has no rhonchi. She has no rales.  Abdominal: Soft. Bowel sounds are normal. She exhibits no distension. There is no tenderness.  Musculoskeletal: Normal range of motion.  Lymphadenopathy:    She has no cervical adenopathy.  Neurological: She is alert and oriented to person, place, and time.  Speech clear without dysarthria.  Sensation and strength intact  throughout upper and lower extremities.  Skin: Skin is warm and dry.  Psychiatric: She has a normal mood and affect. Her behavior is normal.    ED Course  Procedures (including critical care time) Labs Review Labs Reviewed  BASIC METABOLIC PANEL - Abnormal; Notable for the following:    Glucose, Bld 117 (*)    GFR calc non Af Amer 59 (*)    All other components within normal limits  CBC - Abnormal; Notable for  the following:    RBC 5.61 (*)    HCT 46.2 (*)    MCH 25.8 (*)    All other components within normal limits  CBG MONITORING, ED - Abnormal; Notable for the following:    Glucose-Capillary 106 (*)    All other components within normal limits  URINALYSIS, ROUTINE W REFLEX MICROSCOPIC (NOT AT [facility])  POCT CBG (FASTING - GLUCOSE)-MANUAL ENTRY  I-STAT TROPOININ, ED    Imaging Review Dg Chest 2 View  12/13/2014  CLINICAL DATA:  Syncope EXAM: CHEST  2 VIEW COMPARISON:  08/01/2014 FINDINGS: Chronic mild cardiomegaly. Stable aortic and hilar contours, with prominent pulmonary artery shadows, potential pulmonary hypertension. There is no edema, consolidation, effusion, or pneumothorax. No acute osseous findings. IMPRESSION: No acute finding or change from prior. Electronically Signed   By: Monte Fantasia M.D.   On: 12/13/2014 11:25   I have personally reviewed and evaluated these images and lab results as part of my medical decision-making.   EKG Interpretation   Date/Time:  Thursday December 13 2014 10:00:44 EDT Ventricular Rate:  57 PR Interval:  174 QRS Duration: 165 QT Interval:  502 QTC Calculation: 489 R Axis:   41 Text Interpretation:  Sinus rhythm Consider left atrial enlargement Left  bundle branch block no sig change from old. Confirmed by Johnney Killian, MD,  Jeannie Done [phone]) on 12/13/2014 11:25:00 AM      MDM   Final diagnoses:  Syncope, unspecified syncope type    Patient presents with witnessed syncopal episode.  No injury.  Assoc symptom include nausea.  VSS, bradycardia.  Physical exam unremarkable.  EKG shows NSR, no acute changes.  Will obtain orthostatics. CBG 106.  Doubt hypoglycemia.  Troponin, BMP, CBC, UA, and CXR pending.   Patient is orthostatic.  Will give fluids. CXR shows no acute findings.  Troponin 0.01, BMP and CBC unremarkable. UA shows no signs of infection.   Will admit to medicine for further syncopal evaluation.   Case has been discussed with and seen  by Dr. Johnney Killian who agrees with the above plan for admission.   Gloriann Loan, PA-C 12/13/14 1336  Charlesetta Shanks, MD 12/18/14 [phone]

## 2014-12-13 NOTE — ED Notes (Signed)
Pt back from x-ray.

## 2014-12-13 NOTE — ED Notes (Signed)
Nicole Blackwell [phone])

## 2014-12-13 NOTE — ED Notes (Signed)
Nicole Blackwell [phone])

## 2014-12-13 NOTE — ED Notes (Signed)
Pt arrives via [facility]. EMS reports witnessed syncope at a church event lasting approx 30 seconds.  EMS reports pt was assisted to the floor, denies hitting head or falls.  Pt denies dizziness, lightheadness preceding LOC.  Pt denies CP.  EMS reports emesis x 2 with positive ortho VS, blood pressure 60 palpable.  EMS reports giving 4mg  zofran.  Pt AOx4, denies pain at this time.

## 2014-12-14 ENCOUNTER — Ambulatory Visit (HOSPITAL_BASED_OUTPATIENT_CLINIC_OR_DEPARTMENT_OTHER): Payer: Medicare Other

## 2014-12-14 ENCOUNTER — Encounter (HOSPITAL_COMMUNITY): Payer: Self-pay | Admitting: Physician Assistant

## 2014-12-14 DIAGNOSIS — R55 Syncope and collapse: Secondary | ICD-10-CM

## 2014-12-14 DIAGNOSIS — I5032 Chronic diastolic (congestive) heart failure: Secondary | ICD-10-CM

## 2014-12-14 LAB — CBC
HCT: 42.7 % (ref 36.0–46.0)
Hemoglobin: 13.1 g/dL (ref 12.0–15.0)
MCH: 25.5 pg — AB (ref 26.0–34.0)
MCHC: 30.7 g/dL (ref 30.0–36.0)
MCV: 83.2 fL (ref 78.0–100.0)
PLATELETS: 204 10*3/uL (ref 150–400)
RBC: 5.13 MIL/uL — ABNORMAL HIGH (ref 3.87–5.11)
RDW: 15.2 % (ref 11.5–15.5)
WBC: 6.1 10*3/uL (ref 4.0–10.5)

## 2014-12-14 LAB — BASIC METABOLIC PANEL
Anion gap: 8 (ref 5–15)
BUN: 8 mg/dL (ref 6–20)
CHLORIDE: 102 mmol/L (ref 101–111)
CO2: 27 mmol/L (ref 22–32)
CREATININE: 0.84 mg/dL (ref 0.44–1.00)
Calcium: 8.3 mg/dL — ABNORMAL LOW (ref 8.9–10.3)
GFR calc Af Amer: >60 mL/min (ref >60)
GFR calc non Af Amer: >60 mL/min (ref >60)
Glucose, Bld: 84 mg/dL (ref 65–99)
Potassium: 3.5 mmol/L (ref 3.5–5.1)
SODIUM: 137 mmol/L (ref 135–145)

## 2014-12-14 NOTE — Progress Notes (Signed)
Echocardiogram 2D Echocardiogram has been performed.  Tresa Res 12/14/2014, 9:56 AM

## 2014-12-14 NOTE — Discharge Summary (Addendum)
Physician Discharge Summary  Nicole Blackwell YIR:[id number] DOB: [birth year] DOA: 12/13/2014  PCP: Nyoka Cowden, MD  Admit date: 12/13/2014 Discharge date: 12/14/2014  Time spent: < 30 minutes  Recommendations for Outpatient Follow-up:  1. Follow up with Dr. Burnice Logan in 2 weeks   Discharge Diagnoses:  Active Problems:   Syncope   Diastolic CHF [facility])  Discharge Condition: stable  Diet recommendation: regular  Filed Weights   12/13/14 1003 12/14/14 0500  Weight: 72.576 kg (160 lb) 73.573 kg (162 lb 3.2 oz)   History of present illness:  Nicole Blackwell is a 79 y.o. female with a history of syncope who presents after passing out in church today. The patient states that she felt tired today and felt like she needed to sit down but continued to help out at church. The syncopal episode was witnessed by other people at the church. There was no seizure-like activity noted. No loss of bowel or bladder control. When she woke up she recalls the EMS was there. She did not feel any dizziness, palpitations, shortness of breath, chest pain or focal numbness or weakness prior to or after the syncopal episode. She had 2 episodes of vomiting near the time she arrived to the ER. Currently she is asymptomatic. She recalls that she has passed out at least 3 times in the past- the last episode was over a year ago. She has previously had a workup in 2013 with 2-D echo and a head CT. Head CT was repeated this June when she came to the ER for generalized weakness. There were no significant findings on the head CT. Prior echo revealed grade 2 diastolic dysfunction.   Hospital Course:  Recurrent syncope - mildly positive orthostatic vitals in the ER, she was on IVF overnight, she is improved and asymptomatic this morning. Troponin negative in the ED. She has a history of syncopal episodes and had an event monitor in 2013, without significant findings per Epic. Her event was less likely seizure as no  bowel/bladder incontinence, no post ictal features, she has no signs of a systemic infection with unremarkable UA, she is afebrile, no leukocytosis. Cardiology was consulted and did not recommend further monitoring as her syncope was likely in the setting of mild dehydration rather than a cardiac event. She underwent a 2D echo which showed normal ED, no WMA and grade 1 diastolic dysfunction. She had a carotid duplex in 2013 which was normal and was not repeated. She was advised not to drive for 6 months.  Diastolic heart failure - stable per echo, she is currently euvolemic  Procedures:  2D echo  Study Conclusions - Left ventricle: The cavity size was normal. Wall thickness wasnormal. Systolic function was normal. The estimated ejectionfraction was in the range of 60% to 65%. Wall motion was normal;there were no regional wall motion abnormalities. Dopplerparameters are consistent with abnormal left ventricularrelaxation (grade 1 diastolic dysfunction). - Mitral valve: Calcified annulus. - Right ventricle: The cavity size was mildly dilated. Wallthickness was normal. - Right atrium: The atrium was mildly dilated. - Pulmonic valve: There was moderate regurgitation. - Pulmonary arteries: Systolic pressure was mildly increased. PApeak pressure: 33 mm Hg (S).   Consultations:  Cardiology - EP  Discharge Exam: Filed Vitals:   12/13/14 1501 12/13/14 1620 12/13/14 2102 12/14/14 0500  BP: 142/67 166/91 128/61 127/53  Pulse: 60  59 63  Temp:  98 F (36.7 C) 98.7 F (37.1 C) 98.5 F (36.9 C)  TempSrc:  Oral Oral Oral  Resp: 12 13 16    Height:      Weight:    73.573 kg (162 lb 3.2 oz)  SpO2: 100% 100% 100% 100%   General: NAD Cardiovascular: RRR Respiratory: CTA biL  Discharge Instructions    Medication List    TAKE these medications        acetaminophen 500 MG tablet  Commonly known as:  TYLENOL  Take 500 mg by mouth every 6 (six) hours as needed for mild pain.     aspirin  81 MG tablet  Take 81 mg by mouth at bedtime.           Follow-up Information    Follow up with Nyoka Cowden, MD. Schedule an appointment as soon as possible for a visit in 2 weeks.   Specialty:  Internal Medicine   Contact information:   [place] [phone]       The results of significant diagnostics from this hospitalization (including imaging, microbiology, ancillary and laboratory) are listed below for reference.    Significant Diagnostic Studies: Dg Chest 2 View  12/13/2014  CLINICAL DATA:  Syncope EXAM: CHEST  2 VIEW COMPARISON:  08/01/2014 FINDINGS: Chronic mild cardiomegaly. Stable aortic and hilar contours, with prominent pulmonary artery shadows, potential pulmonary hypertension. There is no edema, consolidation, effusion, or pneumothorax. No acute osseous findings. IMPRESSION: No acute finding or change from prior. Electronically Signed   By: Monte Fantasia M.D.   On: 12/13/2014 11:25   Ct Head Wo Contrast  12/13/2014  CLINICAL DATA:  Syncope EXAM: CT HEAD WITHOUT CONTRAST TECHNIQUE: Contiguous axial images were obtained from the base of the skull through the vertex without intravenous contrast. COMPARISON:  08/01/2014 FINDINGS: Skull and Sinuses:Negative for fracture or destructive process. The mastoids, middle ears, and imaged paranasal sinuses are clear. Orbits: Bilateral cataract resection.  No acute/ traumatic finding. Brain: No evidence of acute infarction, hemorrhage, hydrocephalus, or mass lesion/mass effect. Stable pattern of moderate for age chronic small vessel disease, confluent in the deep cerebral white matter. Generalized cortical atrophy, age congruent. Few peripheral/sulcal calcifications, considered incidental. IMPRESSION: 1. No acute finding. 2. Moderate chronic small vessel disease. Electronically Signed   By: Monte Fantasia M.D.   On: 12/13/2014 14:40    Labs: Basic Metabolic Panel:  Recent Labs Lab  12/13/14 1105 12/14/14 0335  NA 141 137  K 3.9 3.5  CL 106 102  CO2 27 27  GLUCOSE 117* 84  BUN 9 8  CREATININE 0.92 0.84  CALCIUM 9.1 8.3*   CBC:  Recent Labs Lab 12/13/14 1105 12/14/14 0335  WBC 7.0 6.1  HGB 14.5 13.1  HCT 46.2* 42.7  MCV 82.4 83.2  PLT 183 204   CBG:  Recent Labs Lab 12/13/14 1024  GLUCAP 106*       Signed:  Nili Honda  [facility] 12/14/2014, 1:06 PM

## 2014-12-14 NOTE — Consult Note (Signed)
CARDIOLOGY CONSULT NOTE   Patient ID: Nicole Blackwell MRN: [id number] DOB/AGE: October 02, 1935 79 y.o.  Admit date: 12/13/2014  Primary Physician   Nyoka Cowden, MD Primary Cardiologist   New Reason for Consultation   Recurrent syncope.  HPI: Nicole Blackwell is a 79 y.o. female with a history of uterine cancer and recurrent syncope who presented to [facility] on 12/13/14 with recurrent syncope.   The patient was at church yesterday and had a syncopal episode that was witnessed by other people at the church. There was no seizure-like activity noted. No loss of bowel or bladder control. When she woke up she recalls the EMS was there. She did not feel any nausea, dizziness, palpitations, shortness of breath, chest pain or weakness prior to or after the syncopal episode. 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 She usually drinks quite a bit of water ( 4 16 oz bottles ) a day. On the day of her syncope she only had one glass of milk and 1 glass of water and 3 grapes. She was exerting herself all day and it was hot outside.   She had 2 episodes of vomiting near the time she arrived to the ER. Currently she is asymptomatic. She recalls that she has passed out at least 3 times in the past- the last episode was over a year ago. She has previously had a workup in 2013 with 2-D echo and a head CT. Head CT was repeated this June when she came to the ER for generalized weakness. There were no significant findings on the head CT. Prior echo revealed grade 2 diastolic dysfunction   Past Medical History  Diagnosis Date  . BACK PAIN 04/30/2008  . OSTEOPOROSIS 03/11/2007  . PLANTAR FASCIITIS 11/16/2006  . SHINGLES 09/27/2007  . SYMPTOMATIC MENOPAUSAL/FEMALE CLIMACTERIC STATES 06/20/2007  . Positive PPD   . Childhood asthma     "just for a couple years"  . Uterine cancer ([place])     "that's why I had the hysterectomy"  . Syncope and collapse 12/13/2014      Past Surgical History  Procedure Laterality Date  . Cataract extraction, bilateral Bilateral   . Sigmoidoscopy    . Tonsillectomy    . Laparoscopic cholecystectomy    . Vaginal hysterectomy    . Dilation and curettage of uterus  1960's X 2    "I lost 2 children to miscarriage"    Allergies  Allergen Reactions  . Penicillin G Benzathine Swelling  . Penicillins Swelling    I have reviewed the patient's current medications . enoxaparin (LOVENOX) injection  40 mg Subcutaneous Q24H     acetaminophen **OR** acetaminophen, alum & mag hydroxide-simeth, ondansetron **OR** ondansetron (ZOFRAN) IV  Prior to Admission medications   Medication Sig Start Date End Date Taking? Authorizing Provider  acetaminophen (TYLENOL) 500 MG tablet Take 500 mg by mouth every 6 (six) hours as needed for mild pain.   Yes Historical Provider, MD  aspirin 81 MG tablet Take 81 mg by mouth at bedtime.    Yes Historical Provider, MD     Social History   Social History  . Marital Status: Divorced    Spouse Name: N/A  . Number of Children: N/A  . Years of Education: N/A   Occupational History  . Not on file.   Social History Main Topics  . Smoking status: Never Smoker   . Smokeless tobacco: Never Used  .  Alcohol Use: No  . Drug Use: No  . Sexual Activity: No   Other Topics Concern  . Not on file   Social History Narrative    No family status information on file.   History reviewed. No pertinent family history.   ROS:  Full 14 point review of systems complete and found to be negative unless listed above.  Physical Exam: Blood pressure 127/53, pulse 63, temperature 98.5 F (36.9 C), temperature source Oral, resp. rate 16, height 5\' 9"  (1.753 m), weight 162 lb 3.2 oz (73.573 kg), SpO2 100 %.  General: Well developed, well nourished, female in no acute distress Head: Eyes PERRLA, No xanthomas.   Normocephalic and atraumatic, oropharynx without edema or exudate.  Lungs: CTAB Heart: HRRR S1  S2, no rub/gallop, Heart irregular rate and rhythm with S1, S2  murmur. pulses are 2+ extrem.   Neck: No carotid bruits. No lymphadenopathy. no JVD. Abdomen: Bowel sounds present, abdomen soft and non-tender without masses or hernias noted. Msk:  No spine or cva tenderness. No weakness, no joint deformities or effusions. Extremities: No clubbing or cyanosis. no edema.  Neuro: Alert and oriented X 3. No focal deficits noted. Psych:  Good affect, responds appropriately Skin: No rashes or lesions noted.  Labs:   Lab Results  Component Value Date   WBC 6.1 12/14/2014   HGB 13.1 12/14/2014   HCT 42.7 12/14/2014   MCV 83.2 12/14/2014   PLT 204 12/14/2014   No results for input(s): INR in the last 72 hours.  Recent Labs Lab 12/14/14 0335  NA 137  K 3.5  CL 102  CO2 27  BUN 8  CREATININE 0.84  CALCIUM 8.3*  GLUCOSE 84    No results for input(s): CKTOTAL, CKMB, TROPONINI in the last 72 hours.  Recent Labs  12/13/14 1117  TROPIPOC 0.01    Echo: Study Date: 01/20/2012 LV EF: 55% -  65% Study Conclusions - Left ventricle: The cavity size was normal. Wall thickness was increased in a pattern of mild LVH. Systolic function was normal. The estimated ejection fraction was in the range of 55% to 65%. Wall motion was normal; there were no regional wall motion abnormalities. Features are consistent with a pseudonormal left ventricular filling pattern, with concomitant abnormal relaxation and increased filling pressure (grade 2 diastolic dysfunction). - Right atrium: The atrium was mildly dilated. - Atrial septum: No defect or patent foramen ovale was identified. - Pulmonary arteries: PA peak pressure: 52mm Hg (S).  ECG: HR 57. NSR with LBBB  Radiology:  Dg Chest 2 View  12/13/2014  CLINICAL DATA:  Syncope EXAM: CHEST  2 VIEW COMPARISON:  08/01/2014 FINDINGS: Chronic mild cardiomegaly. Stable aortic and hilar contours, with prominent pulmonary artery  shadows, potential pulmonary hypertension. There is no edema, consolidation, effusion, or pneumothorax. No acute osseous findings. IMPRESSION: No acute finding or change from prior. Electronically Signed   By: Monte Fantasia M.D.   On: 12/13/2014 11:25   Ct Head Wo Contrast  12/13/2014  CLINICAL DATA:  Syncope EXAM: CT HEAD WITHOUT CONTRAST TECHNIQUE: Contiguous axial images were obtained from the base of the skull through the vertex without intravenous contrast. COMPARISON:  08/01/2014 FINDINGS: Skull and Sinuses:Negative for fracture or destructive process. The mastoids, middle ears, and imaged paranasal sinuses are clear. Orbits: Bilateral cataract resection.  No acute/ traumatic finding. Brain: No evidence of acute infarction, hemorrhage, hydrocephalus, or mass lesion/mass effect. Stable pattern of moderate for age chronic small vessel disease, confluent in the deep  cerebral white matter. Generalized cortical atrophy, age congruent. Few peripheral/sulcal calcifications, considered incidental. IMPRESSION: 1. No acute finding. 2. Moderate chronic small vessel disease. Electronically Signed   By: Monte Fantasia M.D.   On: 12/13/2014 14:40    ASSESSMENT AND PLAN:    Active Problems:   Syncope   Diastolic CHF (HCC)  Nicole Blackwell is a 79 y.o. female with a history of uterine cancer and recurrent syncope who presented to [facility] on 12/13/14 with recurrent syncope.   Recurrent syncope-  -- Orthostatic vital signs in the ED not impressive. ECG with LBBB (similar to ECG in June).  -- She has a history of syncopal episodes and had an event monitor in 2013, without significant findings -- 2D echo 2013 with normal EF, no WMA, grade 2 diastolic dysfunction. 2D ECHO on this admission pending. -- We feel that her syncope yesterday was likely due to poor PO intake in combination with a long day working at [employer]. We feel that she is stable for discharge from a cardiac standpoint pending the ECHO this  admission.    SignedCrista Luria 12/14/2014 9:19 AM  Pager [phone]  Co-Sign MD  I have seen and examined this patient with Angelena Form.  Agree with above, note added to reflect my findings.  On exam, regular rhythm, no murmurs, lungs clear.  Patient had syncope at church.  From her history, it seems that the syncope was due to over exertion with a decrease in her normal PO intake of fluids and food.  At this time, it does not seem that she has an arrhythmic indication for her syncope.  She therefore does not require further monitoring.  She does have a LBBB with no other conduction disease, but should the echo be normal, no further workup is needed as she has no other cardiac complaints.  Dammon Makarewicz M. Kamille Toomey MD 12/14/2014 11:31 AM

## 2014-12-14 NOTE — Discharge Instructions (Signed)
Follow with Nyoka Cowden, MD in 1-2 weeks  Please get a complete blood count and chemistry panel checked by your Primary MD at your next visit, and again as instructed by your Primary MD. Please get your medications reviewed and adjusted by your Primary MD.  Please request your Primary MD to go over all Hospital Tests and Procedure/Radiological results at the follow up, please get all Hospital records sent to your Prim MD by signing hospital release before you go home.  If you had Pneumonia of Lung problems at the Hospital: Please get a 2 view Chest X ray done in 6-8 weeks after hospital discharge or sooner if instructed by your Primary MD.  If you have Congestive Heart Failure: Please call your Cardiologist or Primary MD anytime you have any of the following symptoms:  1) 3 pound weight gain in 24 hours or 5 pounds in 1 week  2) shortness of breath, with or without a dry hacking cough  3) swelling in the hands, feet or stomach  4) if you have to sleep on extra pillows at night in order to breathe  Follow cardiac low salt diet and 1.5 lit/day fluid restriction.  If you have diabetes Accuchecks 4 times/day, Once in AM empty stomach and then before each meal. Log in all results and show them to your primary doctor at your next visit. If any glucose reading is under 80 or above 300 call your primary MD immediately.  If you have Seizure/Convulsions/Epilepsy: Please do not drive, operate heavy machinery, participate in activities at heights or participate in high speed sports until you have seen by Primary MD or a Neurologist and advised to do so again.  If you had Gastrointestinal Bleeding: Please ask your Primary MD to check a complete blood count within one week of discharge or at your next visit. Your endoscopic/colonoscopic biopsies that are pending at the time of discharge, will also need to followed by your Primary MD.  Get Medicines reviewed and adjusted. Please take all  your medications with you for your next visit with your Primary MD  Please request your Primary MD to go over all hospital tests and procedure/radiological results at the follow up, please ask your Primary MD to get all Hospital records sent to his/her office.  If you experience worsening of your admission symptoms, develop shortness of breath, life threatening emergency, suicidal or homicidal thoughts you must seek medical attention immediately by calling 911 or calling your MD immediately  if symptoms less severe.  You must read complete instructions/literature along with all the possible adverse reactions/side effects for all the Medicines you take and that have been prescribed to you. Take any new Medicines after you have completely understood and accpet all the possible adverse reactions/side effects.   Do not drive or operate heavy machinery when taking Pain medications.   Do not take more than prescribed Pain, Sleep and Anxiety Medications  Special Instructions: If you have smoked or chewed Tobacco  in the last 2 yrs please stop smoking, stop any regular Alcohol  and or any Recreational drug use.  Wear Seat belts while driving.  Please note You were cared for by a hospitalist during your hospital stay. If you have any questions about your discharge medications or the care you received while you were in the hospital after you are discharged, you can call the unit and asked to speak with the hospitalist on call if the hospitalist that took care of you is not available. Once  you are discharged, your primary care physician will handle any further medical issues. Please note that NO REFILLS for any discharge medications will be authorized once you are discharged, as it is imperative that you return to your primary care physician (or establish a relationship with a primary care physician if you do not have one) for your aftercare needs so that they can reassess your need for medications and monitor  your lab values.  You can reach the hospitalist office at phone [phone] or fax [phone]   If you do not have a primary care physician, you can call [phone] for a physician referral.  Activity: As tolerated with Full fall precautions use walker/cane & assistance as needed  Diet: regular  Disposition Home

## 2014-12-14 NOTE — Progress Notes (Signed)
Patient discharge paperwork gone over in detail with patient. All of patient's questions have been answered to patients satisfaction. Patient IV removed intact, telemetry discontinued. Patient discharged to home with family, by way of wheelchair.

## 2014-12-14 NOTE — Progress Notes (Deleted)
PROGRESS NOTE  Nicole Blackwell ZOX:[id number] DOB: 1935/08/01 DOA: 12/13/2014 PCP: Nyoka Cowden, MD   HPI: Nicole Blackwell is a 79 y.o. female with a history of syncope who presents after passing out in church today. The patient states that she felt tired today and felt like she needed to sit down but continued to help out at church. The syncopal episode was witnessed by other people at the church. There was no seizure-like activity noted. No loss of bowel or bladder control. When she woke up she recalls the EMS was there. She did not feel any dizziness, palpitations, shortness of breath, chest pain or focal numbness or weakness prior to or after the syncopal episode.  Subjective / 24 H Interval events - no complaints this morning, denies chest pain / palpitations - denies nausea/vomiting - able to ambulate to the bathroom this morning with help, asymptomatic.    Assessment/Plan: Active Problems:   Syncope   Diastolic CHF (HCC)  Recurrent syncope - mildly positive orthostatic vitals in the ER, she endorses good fluid intake at home. Orthostatic vital signs in the ED not impressive - has a history of syncopal episodes and had an event monitor in 2013, without significant findings per Epic  - less likely seizure as no bowel/bladder incontinence, no post ictal features - UA unremarkable, afebrile, no leukocytosis - 2D echo 2013 with normal EF, no WMA, grade 2 diastolic dysfunction - carotid duplex in 2013 normal as well  - Does not appear that it was vasovagal as the vomiting occurred after she arrived to the ER - stop IVF today, she has good po intake - repeat 2D echo pending - repeat orthostatics this morning - consulted cardiology re event monitor vs loop recorder prior to discharge - advised no driving 6 months  Diastolic heart failure - 2-D echo in 2013 revealed grade 2 diastolic dysfunction - Repeat echo   Diet: Diet Heart Room service appropriate?: Yes; Fluid  consistency:: Thin Fluids: none DVT Prophylaxis: Lovenox  Code Status: Full Code Family Communication: no family bedside  Disposition Plan: home when ready   Barriers to discharge: cards consult  Consultants:  Cardiology   Procedures:  None    Antibiotics  Anti-infectives    None       Studies  Dg Chest 2 View  12/13/2014  CLINICAL DATA:  Syncope EXAM: CHEST  2 VIEW COMPARISON:  08/01/2014 FINDINGS: Chronic mild cardiomegaly. Stable aortic and hilar contours, with prominent pulmonary artery shadows, potential pulmonary hypertension. There is no edema, consolidation, effusion, or pneumothorax. No acute osseous findings. IMPRESSION: No acute finding or change from prior. Electronically Signed   By: Monte Fantasia M.D.   On: 12/13/2014 11:25   Ct Head Wo Contrast  12/13/2014  CLINICAL DATA:  Syncope EXAM: CT HEAD WITHOUT CONTRAST TECHNIQUE: Contiguous axial images were obtained from the base of the skull through the vertex without intravenous contrast. COMPARISON:  08/01/2014 FINDINGS: Skull and Sinuses:Negative for fracture or destructive process. The mastoids, middle ears, and imaged paranasal sinuses are clear. Orbits: Bilateral cataract resection.  No acute/ traumatic finding. Brain: No evidence of acute infarction, hemorrhage, hydrocephalus, or mass lesion/mass effect. Stable pattern of moderate for age chronic small vessel disease, confluent in the deep cerebral white matter. Generalized cortical atrophy, age congruent. Few peripheral/sulcal calcifications, considered incidental. IMPRESSION: 1. No acute finding. 2. Moderate chronic small vessel disease. Electronically Signed   By: Monte Fantasia M.D.   On: 12/13/2014 14:40    Objective  Filed  Vitals:   12/13/14 1501 12/13/14 1620 12/13/14 2102 12/14/14 0500  BP: 142/67 166/91 128/61 127/53  Pulse: 60  59 63  Temp:  98 F (36.7 C) 98.7 F (37.1 C) 98.5 F (36.9 C)  TempSrc:  Oral Oral Oral  Resp: 12 13 16      Height:      Weight:    73.573 kg (162 lb 3.2 oz)  SpO2: 100% 100% 100% 100%    Intake/Output Summary (Last 24 hours) at 12/14/14 0846 Last data filed at 12/13/14 1724  Gross per 24 hour  Intake   1240 ml  Output      0 ml  Net   1240 ml   Filed Weights   12/13/14 1003 12/14/14 0500  Weight: 72.576 kg (160 lb) 73.573 kg (162 lb 3.2 oz)    Exam:  GENERAL: NAD  HEENT: head NCAT, no scleral icterus. Pupils round and reactive.   NECK: Supple. No LAD  LUNGS: Clear to auscultation. No wheezing or crackles  HEART: Regular rate and rhythm without murmur. 2+ pulses, no JVD, no peripheral edema  ABDOMEN: Soft, non-distended, non-tender. Positive bowel sounds.  EXTREMITIES: Without any cyanosis or clubbing. Good muscle tone  NEUROLOGIC: non focal   PSYCHIATRIC: Normal mood and affect  SKIN: No ulceration, induration, rashes present.  Data Reviewed: Basic Metabolic Panel:  Recent Labs Lab 12/13/14 1105 12/14/14 0335  NA 141 137  K 3.9 3.5  CL 106 102  CO2 27 27  GLUCOSE 117* 84  BUN 9 8  CREATININE 0.92 0.84  CALCIUM 9.1 8.3*   CBC:  Recent Labs Lab 12/13/14 1105 12/14/14 0335  WBC 7.0 6.1  HGB 14.5 13.1  HCT 46.2* 42.7  MCV 82.4 83.2  PLT 183 204   CBG:  Recent Labs Lab 12/13/14 1024  GLUCAP 106*   Scheduled Meds: . enoxaparin (LOVENOX) injection  40 mg Subcutaneous Q24H   Continuous Infusions:    Marzetta Board, MD [facility] Pager [phone]. If 7 PM - 7 AM, please contact night-coverage at [email or web address], password [facility] 12/14/2014, 8:46 AM  LOS: 1 day

## 2014-12-17 ENCOUNTER — Telehealth: Payer: Self-pay | Admitting: Internal Medicine

## 2014-12-17 NOTE — Telephone Encounter (Signed)
Pt was discharge for [facility] on 12-14-14 and needs post hospital follow up with in 1 wk. Can I create 30 min slot?

## 2014-12-17 NOTE — Telephone Encounter (Signed)
Yes, that is fine. 

## 2014-12-17 NOTE — Telephone Encounter (Signed)
Butch Penny dr [facility] does not have an appt slots where I can create 30 min slot. I have pt sch for 12-25-14 at 4 pm. Pt would like something sooner. Can I use sda slot and block later appt time slot?

## 2014-12-17 NOTE — Telephone Encounter (Signed)
PT has been sch for 12/21/14

## 2014-12-17 NOTE — Progress Notes (Signed)
Patient call department stating she has not been able to make followup appointment with primary MD and request our assistance.  Attempted to make patient followup appointment and was told by office staff at Dr. Arvilla Market office they needed to speak with physician and work patient in.  Primary MD office will call patient back with appointment.  At time of conversation, patient could not remember her telephone number.  Number showing on phone was [phone].  Office to contact her emergency contact if unable to reach patient. Nicole Blackwell

## 2014-12-21 ENCOUNTER — Encounter: Payer: Self-pay | Admitting: Internal Medicine

## 2014-12-21 ENCOUNTER — Ambulatory Visit (INDEPENDENT_AMBULATORY_CARE_PROVIDER_SITE_OTHER): Payer: Medicare Other | Admitting: Internal Medicine

## 2014-12-21 VITALS — BP 146/84 | HR 73 | Temp 98.4°F | Resp 20 | Ht 69.0 in | Wt 161.0 lb

## 2014-12-21 DIAGNOSIS — Z23 Encounter for immunization: Secondary | ICD-10-CM

## 2014-12-21 DIAGNOSIS — I5031 Acute diastolic (congestive) heart failure: Secondary | ICD-10-CM | POA: Diagnosis not present

## 2014-12-21 DIAGNOSIS — R55 Syncope and collapse: Secondary | ICD-10-CM | POA: Diagnosis not present

## 2014-12-21 NOTE — Patient Instructions (Signed)
Please check your blood pressure on a regular basis.  If it is consistently greater than 150/90, please make an office appointment.    It is important that you exercise regularly, at least 20 minutes 3 to 4 times per week.  If you develop chest pain or shortness of breath seek  medical attention.  Return in one year for follow-up 

## 2014-12-21 NOTE — Progress Notes (Signed)
Subjective:    Patient ID: Nicole Blackwell, female    DOB: 07-Oct-1935, 79 y.o.   MRN: [id number]  HPI  79 year old patient who is seen today following a recent hospital discharge.  She was admitted for evaluation of syncope.  This has been a intermittent problem for years.  Prior evaluation have included EEGs event monitors, carotid artery Doppler studies and head CT scans.  She has had a follow-up 2-D echocardiogram that was essentially normal.  She has felt well since her hospital discharge No obvious precipitating factors other than possible prolonged standing.  She has noticed some dizziness with prolonged standing in the kitchen and this may have precipitated her last episode.  Hospital records reviewed   Admit date: 12/13/2014 Discharge date: 12/14/2014  Recommendations for Outpatient Follow-up:  1. Follow up with Dr. Burnice Logan in 2 weeks  Discharge Diagnoses:  Active Problems:  Syncope  Diastolic CHF [facility])  Discharge Condition: stable  Diet recommendation: regular  Past Medical History  Diagnosis Date  . BACK PAIN   . OSTEOPOROSIS   . PLANTAR FASCIITIS   . SHINGLES   . SYMPTOMATIC MENOPAUSAL/FEMALE CLIMACTERIC STATES   . Positive PPD   . Uterine cancer ([place])   . Syncope and collapse 12/13/2014    Social History   Social History  . Marital Status: Divorced    Spouse Name: N/A  . Number of Children: N/A  . Years of Education: N/A   Occupational History  . Not on file.   Social History Main Topics  . Smoking status: Never Smoker   . Smokeless tobacco: Never Used  . Alcohol Use: No  . Drug Use: No  . Sexual Activity: No   Other Topics Concern  . Not on file   Social History Narrative    Past Surgical History  Procedure Laterality Date  . Cataract extraction, bilateral Bilateral   . Sigmoidoscopy    . Tonsillectomy    . Laparoscopic cholecystectomy    . Vaginal hysterectomy    . Dilation and curettage of uterus  1960's X 2    "I lost 2  children to miscarriage"    No family history on file.  Allergies  Allergen Reactions  . Penicillin G Benzathine Swelling  . Penicillins Swelling    Current Outpatient Prescriptions on File Prior to Visit  Medication Sig Dispense Refill  . acetaminophen (TYLENOL) 500 MG tablet Take 500 mg by mouth every 6 (six) hours as needed for mild pain.    Marland Kitchen aspirin 81 MG tablet Take 81 mg by mouth at bedtime.      No current facility-administered medications on file prior to visit.    BP 146/84 mmHg  Pulse 73  Temp(Src) 98.4 F (36.9 C) (Oral)  Resp 20  Ht 5\' 9"  (1.753 m)  Wt 161 lb (73.029 kg)  BMI 23.76 kg/m2  SpO2 97%    Review of Systems  Constitutional: Negative.   HENT: Negative for congestion, dental problem, hearing loss, rhinorrhea, sinus pressure, sore throat and tinnitus.   Eyes: Negative for pain, discharge and visual disturbance.  Respiratory: Negative for cough and shortness of breath.   Cardiovascular: Negative for chest pain, palpitations and leg swelling.  Gastrointestinal: Negative for nausea, vomiting, abdominal pain, diarrhea, constipation, blood in stool and abdominal distention.  Genitourinary: Negative for dysuria, urgency, frequency, hematuria, flank pain, vaginal bleeding, vaginal discharge, difficulty urinating, vaginal pain and pelvic pain.  Musculoskeletal: Negative for joint swelling, arthralgias and gait problem.  Skin: Negative for rash.  Neurological: Positive for dizziness and syncope. Negative for speech difficulty, weakness, numbness and headaches.  Hematological: Negative for adenopathy.  Psychiatric/Behavioral: Negative for behavioral problems, dysphoric mood and agitation. The patient is not nervous/anxious.        Objective:   Physical Exam  Constitutional: She is oriented to person, place, and time. She appears well-developed and well-nourished.  Repeat blood pressure 120/82  HENT:  Head: Normocephalic.  Right Ear: External ear normal.   Left Ear: External ear normal.  Mouth/Throat: Oropharynx is clear and moist.  Eyes: Conjunctivae and EOM are normal. Pupils are equal, round, and reactive to light.  Neck: Normal range of motion. Neck supple. No thyromegaly present.  Cardiovascular: Normal rate, regular rhythm, normal heart sounds and intact distal pulses.   Pulmonary/Chest: Effort normal and breath sounds normal.  Abdominal: Soft. Bowel sounds are normal. She exhibits no mass. There is no tenderness.  Musculoskeletal: Normal range of motion.  Lymphadenopathy:    She has no cervical adenopathy.  Neurological: She is alert and oriented to person, place, and time.  Skin: Skin is warm and dry. No rash noted.  Psychiatric: She has a normal mood and affect. Her behavior is normal.          Assessment & Plan:   Recurrent syncope.  Patient cautioned against prolonged standing.  Will attempt to take breaks with the walks, etc., will consider support hose  History mild diastolic heart failure Osteoporosis Menopausal syndrome  Return in one year for annual exam

## 2014-12-21 NOTE — Progress Notes (Signed)
Pre visit review using our clinic review tool, if applicable. No additional management support is needed unless otherwise documented below in the visit note. 

## 2014-12-25 ENCOUNTER — Ambulatory Visit: Payer: Medicare Other | Admitting: Internal Medicine

## 2015-10-03 NOTE — Progress Notes (Deleted)
Subjective:   Nicole Blackwell is a 80 y.o. female who presents for Medicare Annual (Subsequent) preventive examination.    HRA assessment completed during this visit with Nicole Blackwell  The Patient was informed that the wellness visit is to identify future health risk and educate and initiate measures that can reduce risk for increased disease through the lifespan.    NO ROS; Medicare Wellness Visit Last OV:  11/2014 Labs completed: 11/2014  Uterine cancer;  Shingles Osteoporosis / 2008 Fosamax 2012 and 2013    Psychosocial Never smoked ETOH  NONE   Medications reviewed for issues; compliance; otc meds  BMI: 23  Diet Special Diet  Nutritional counseling given:   Dental work:  Exercise;    HOME SAFETY reviewed for short term and long term safety if aging in place. Home: level; barriers; or needs identified as bathroom railing or other review; Bathroom safety Fall hx;  Fear of falling Given education on "Fall Prevention in the Home" for more safety tips the patient can apply as appropriate.   Personal safety issues reviewed:  1.  for risk such as safe community 2.  smoke detector 3.  firearms safety if applicable  4. protection when in the sun;  5. driving safety for seniors or any recent accidents.  UA or BOWEL incontinence;   Functional losses from last year to this year?   Risk for Depression reviewed: Any emotional problems? Anxious, depressed, irritable, sad or blue?  Denies feeling depressed or hopeless; voices pleasure in daily life How many social activities have you been engaged in within the last 2 weeks? Who would help you with chores; illness; shopping other?  Sleep   Cognitive;  Manages checkbook, medications; no failures of task Ad8 score reviewed for issues;  Issues making decisions; no  Less interest in hobbies / activities" no  Repeats questions, stories; family complaining: NO  Trouble using ordinary gadgets; microwave; computer:  no  Forgets the month or year: no  Mismanaging finances: no  Missing apt: no but does write them down  Daily problems with thinking of memory NO Ad8 score is 0  MMSE not appropriate unless AD8 score is > 2   Advanced Directive addressed;    Counseling Health Maintenance Gaps:  Colonoscopy; 09/2010 EKG: 12/14/2014 Mammogram: 08/2010 Dexa/2008 -3 to spine / DUE  PAP: educated regarding the need for GYN exam;   Hearing:  Right ear Left ear Difficulty hearing a whisper Tinnitus; American [employer]inn[employer];   Ophthalmology exam Glaucoma screening Diabetic retinopathy if appropriate   Immunizations Due: (Vaccines reviewed and educated regarding any overdue)  zostavax (hx of shingles)  prevnar postponed to 11/2015  Established and updated Risk reviewed and appropriate referral made or health recommendations:  Current Care Team reviewed and updated         Objective:     Vitals: There were no vitals taken for this visit.  There is no height or weight on file to calculate BMI.   Tobacco History  Smoking Status  . Never Smoker  Smokeless Tobacco  . Never Used     Counseling given: Not Answered   Past Medical History:  Diagnosis Date  . BACK PAIN   . OSTEOPOROSIS   . PLANTAR FASCIITIS   . Positive PPD   . SHINGLES   . SYMPTOMATIC MENOPAUSAL/FEMALE CLIMACTERIC STATES   . Syncope and collapse 12/13/2014  . Uterine cancer [facility])    Past Surgical History:  Procedure Laterality Date  . CATARACT EXTRACTION, BILATERAL Bilateral   .  DILATION AND CURETTAGE OF UTERUS  1960's X 2   "I lost 2 children to miscarriage"  . LAPAROSCOPIC CHOLECYSTECTOMY    . SIGMOIDOSCOPY    . TONSILLECTOMY    . VAGINAL HYSTERECTOMY     No family history on file. History  Sexual Activity  . Sexual activity: No    Outpatient Encounter Prescriptions as of 10/04/2015  Medication Sig  . acetaminophen (TYLENOL) 500 MG tablet Take 500 mg by mouth every 6 (six) hours as needed  for mild pain.  Marland Kitchen aspirin 81 MG tablet Take 81 mg by mouth at bedtime.    No facility-administered encounter medications on file as of 10/04/2015.     Activities of Daily Living In your present state of health, do you have any difficulty performing the following activities: 12/13/2014  Hearing? N  Vision? N  Difficulty concentrating or making decisions? Y  Walking or climbing stairs? N  Dressing or bathing? Y  Doing errands, shopping? N  Some recent data might be hidden    Patient Care Team: Marletta Lor, MD as PCP - General    Assessment:     Exercise Activities and Dietary recommendations    Goals    None     Fall Risk Fall Risk  08/10/2014  Falls in the past year? Yes  Number falls in past yr: 1  Injury with Fall? No  Risk for fall due to : Impaired balance/gait   Depression Screen PHQ 2/9 Scores 08/10/2014  PHQ - 2 Score 0     Cognitive Testing No flowsheet data found.  Immunization History  Administered Date(s) Administered  . Influenza, High Dose Seasonal PF 12/21/2014  . Pneumococcal Polysaccharide-23 08/25/2010  . Td 11/16/2006   Screening Tests Health Maintenance  Topic Date Due  . ZOSTAVAX  07/17/1997  . DEXA SCAN  07/18/2002  . INFLUENZA VACCINE  09/24/2015  . PNA vac Low Risk Adult (2 of 2 - PCV13) 12/21/2015 (Originally 08/25/2011)  . TETANUS/TDAP  11/15/2016      Plan:   *** During the course of the visit the patient was educated and counseled about the following appropriate screening and preventive services:   Vaccines to include Pneumoccal, Influenza, Hepatitis B, Td, Zostavax, HCV  Electrocardiogram  Cardiovascular Disease  Colorectal cancer screening  Bone density screening  Diabetes screening  Glaucoma screening  Mammography/PAP  Nutrition counseling   Patient Instructions (the written plan) was given to the patient.   [name], RN  10/03/2015

## 2015-10-04 ENCOUNTER — Ambulatory Visit (INDEPENDENT_AMBULATORY_CARE_PROVIDER_SITE_OTHER): Payer: Medicare Other | Admitting: Internal Medicine

## 2015-10-04 ENCOUNTER — Encounter: Payer: Self-pay | Admitting: Internal Medicine

## 2015-10-04 ENCOUNTER — Ambulatory Visit: Payer: Medicare Other

## 2015-10-04 VITALS — BP 140/80 | HR 75 | Temp 98.4°F | Ht 69.0 in | Wt 168.0 lb

## 2015-10-04 DIAGNOSIS — R55 Syncope and collapse: Secondary | ICD-10-CM | POA: Diagnosis not present

## 2015-10-04 DIAGNOSIS — R410 Disorientation, unspecified: Secondary | ICD-10-CM

## 2015-10-04 DIAGNOSIS — F05 Delirium due to known physiological condition: Secondary | ICD-10-CM | POA: Diagnosis not present

## 2015-10-04 DIAGNOSIS — I5032 Chronic diastolic (congestive) heart failure: Secondary | ICD-10-CM | POA: Diagnosis not present

## 2015-10-04 LAB — CBC WITH DIFFERENTIAL/PLATELET
BASOS PCT: 0.2 % (ref 0.0–3.0)
Basophils Absolute: 0 10*3/uL (ref 0.0–0.1)
EOS ABS: 0 10*3/uL (ref 0.0–0.7)
Eosinophils Relative: 0.5 % (ref 0.0–5.0)
HCT: 46.2 % — ABNORMAL HIGH (ref 36.0–46.0)
Hemoglobin: 14.9 g/dL (ref 12.0–15.0)
LYMPHS ABS: 3 10*3/uL (ref 0.7–4.0)
Lymphocytes Relative: 38.7 % (ref 12.0–46.0)
MCHC: 32.4 g/dL (ref 30.0–36.0)
MCV: 79 fl (ref 78.0–100.0)
Monocytes Absolute: 0.5 10*3/uL (ref 0.1–1.0)
Monocytes Relative: 5.9 % (ref 3.0–12.0)
NEUTROS ABS: 4.3 10*3/uL (ref 1.4–7.7)
NEUTROS PCT: 54.7 % (ref 43.0–77.0)
PLATELETS: 258 10*3/uL (ref 150.0–400.0)
RBC: 5.85 Mil/uL — ABNORMAL HIGH (ref 3.87–5.11)
RDW: 15.2 % (ref 11.5–15.5)
WBC: 7.8 10*3/uL (ref 4.0–10.5)

## 2015-10-04 LAB — COMPREHENSIVE METABOLIC PANEL
ALT: 21 U/L (ref 0–35)
AST: 31 U/L (ref 0–37)
Albumin: 4.1 g/dL (ref 3.5–5.2)
Alkaline Phosphatase: 52 U/L (ref 39–117)
BUN: 14 mg/dL (ref 6–23)
CHLORIDE: 102 meq/L (ref 96–112)
CO2: 31 meq/L (ref 19–32)
CREATININE: 0.87 mg/dL (ref 0.40–1.20)
Calcium: 9.9 mg/dL (ref 8.4–10.5)
GFR: 80.94 mL/min (ref >60.00)
Glucose, Bld: 106 mg/dL — ABNORMAL HIGH (ref 70–99)
Potassium: 3.9 mEq/L (ref 3.5–5.1)
SODIUM: 140 meq/L (ref 135–145)
Total Bilirubin: 2.2 mg/dL — ABNORMAL HIGH (ref 0.2–1.2)
Total Protein: 7.2 g/dL (ref 6.0–8.3)

## 2015-10-04 LAB — LIPID PANEL
CHOL/HDL RATIO: 4
CHOLESTEROL: 232 mg/dL — AB (ref 0–200)
HDL: 63.7 mg/dL (ref >39.00)
LDL CALC: 156 mg/dL — AB (ref 0–99)
NonHDL: 168.35
Triglycerides: 63 mg/dL (ref 0.0–149.0)
VLDL: 12.6 mg/dL (ref 0.0–40.0)

## 2015-10-04 LAB — VITAMIN B12: VITAMIN B 12: 357 pg/mL (ref 211–911)

## 2015-10-04 LAB — TSH: TSH: 0.84 u[IU]/mL (ref 0.35–4.50)

## 2015-10-04 MED ORDER — DONEPEZIL HCL 5 MG PO TABS: 5.0000 mg | ORAL_TABLET | Freq: Every day | ORAL | Status: DC

## 2015-10-04 NOTE — Progress Notes (Signed)
Subjective:    Patient ID: Nicole Blackwell, female    DOB: 07/14/1935, 80 y.o.   MRN: [id number]  HPI 80 year old patient who has a history recurrent syncope.  Head CT is in the past have revealed moderate microvascular changes. She was scheduled today for a wellness exam, but apparently got lost on her way to the office and was an hour and a half late.  Patient was made an acute visit due to confusion.  The patient denies any recent falls, syncope, or head trauma  MMSE 24/30  Past Medical History:  Diagnosis Date  . BACK PAIN   . OSTEOPOROSIS   . PLANTAR FASCIITIS   . Positive PPD   . SHINGLES   . SYMPTOMATIC MENOPAUSAL/FEMALE CLIMACTERIC STATES   . Syncope and collapse 12/13/2014  . Uterine cancer [facility])      Social History   Social History  . Marital status: Divorced    Spouse name: N/A  . Number of children: N/A  . Years of education: N/A   Occupational History  . Not on file.   Social History Main Topics  . Smoking status: Never Smoker  . Smokeless tobacco: Never Used  . Alcohol use No  . Drug use: No  . Sexual activity: No   Other Topics Concern  . Not on file   Social History Narrative  . No narrative on file    Past Surgical History:  Procedure Laterality Date  . CATARACT EXTRACTION, BILATERAL Bilateral   . DILATION AND CURETTAGE OF UTERUS  1960's X 2   "I lost 2 children to miscarriage"  . LAPAROSCOPIC CHOLECYSTECTOMY    . SIGMOIDOSCOPY    . TONSILLECTOMY    . VAGINAL HYSTERECTOMY      No family history on file.  Allergies  Allergen Reactions  . Penicillin G Benzathine Swelling  . Penicillins Swelling    Current Outpatient Prescriptions on File Prior to Visit  Medication Sig Dispense Refill  . acetaminophen (TYLENOL) 500 MG tablet Take 500 mg by mouth every 6 (six) hours as needed for mild pain.    Marland Kitchen aspirin 81 MG tablet Take 81 mg by mouth at bedtime.      No current facility-administered medications on file prior to visit.     BP  140/80 (BP Location: Left Arm, Patient Position: Sitting, Cuff Size: Normal)   Pulse 75   Temp 98.4 F (36.9 C) (Oral)   Ht 5\' 9"  (1.753 m)   Wt 168 lb (76.2 kg)   SpO2 99%   BMI 24.81 kg/m      Review of Systems  Constitutional: Negative.  Negative for appetite change, fatigue, fever and unexpected weight change.  HENT: Negative for congestion, dental problem, ear pain, hearing loss, mouth sores, nosebleeds, rhinorrhea, sinus pressure, sore throat, tinnitus, trouble swallowing and voice change.   Eyes: Negative for photophobia, pain, discharge, redness and visual disturbance.  Respiratory: Negative for cough, chest tightness and shortness of breath.   Cardiovascular: Negative for chest pain, palpitations and leg swelling.  Gastrointestinal: Negative for abdominal distention, abdominal pain, blood in stool, constipation, diarrhea, nausea, rectal pain and vomiting.  Genitourinary: Negative for difficulty urinating, dysuria, flank pain, frequency, genital sores, hematuria, menstrual problem, pelvic pain, urgency, vaginal bleeding, vaginal discharge and vaginal pain.  Musculoskeletal: Negative for arthralgias, back pain, gait problem, joint swelling and neck stiffness.  Skin: Negative for rash.  Neurological: Negative for dizziness, syncope, speech difficulty, weakness, light-headedness, numbness and headaches.  Hematological: Negative for adenopathy. Does  not bruise/bleed easily.  Psychiatric/Behavioral: Positive for confusion and decreased concentration. Negative for agitation, behavioral problems, dysphoric mood, self-injury and suicidal ideas. The patient is not nervous/anxious.        Objective:   Physical Exam  Constitutional: She is oriented to person, place, and time. She appears well-developed and well-nourished.  HENT:  Head: Normocephalic.  Right Ear: External ear normal.  Left Ear: External ear normal.  Mouth/Throat: Oropharynx is clear and moist.  Eyes: Conjunctivae and  EOM are normal. Pupils are equal, round, and reactive to light.  Neck: Normal range of motion. Neck supple. No thyromegaly present.  Cardiovascular: Normal rate, regular rhythm, normal heart sounds and intact distal pulses.   Pulmonary/Chest: Effort normal and breath sounds normal.  Abdominal: Soft. Bowel sounds are normal. She exhibits no mass. There is no tenderness.  Musculoskeletal: Normal range of motion.  Lymphadenopathy:    She has no cervical adenopathy.  Neurological: She is alert and oriented to person, place, and time. No cranial nerve deficit. Coordination normal.  MMSE 24/30  Exam nonfocal  Skin: Skin is warm and dry. No rash noted.  Psychiatric: She has a normal mood and affect. Her behavior is normal.          Assessment & Plan:   Subacute confusional state.  In view of her history of recurrent syncope.  Will check a head CT to rule out subdural Check screening lab CPX as scheduled Aricept 5  Recheck as scheduled for her annual exam  Nyoka Cowden, MD

## 2015-10-04 NOTE — Patient Instructions (Addendum)
Return as scheduled for your annual exam  Head CT scan as discussed

## 2015-10-05 LAB — HEPATITIS C ANTIBODY: HCV Ab: NEGATIVE

## 2015-10-05 LAB — RPR

## 2015-10-07 ENCOUNTER — Inpatient Hospital Stay: Admission: RE | Admit: 2015-10-07 | Payer: Medicare Other | Source: Ambulatory Visit

## 2015-10-08 ENCOUNTER — Ambulatory Visit (INDEPENDENT_AMBULATORY_CARE_PROVIDER_SITE_OTHER)
Admission: RE | Admit: 2015-10-08 | Discharge: 2015-10-08 | Disposition: A | Discharge status: Home / Self Care | Payer: Medicare Other | Source: Ambulatory Visit | Attending: Internal Medicine | Admitting: Internal Medicine

## 2015-10-08 DIAGNOSIS — R410 Disorientation, unspecified: Secondary | ICD-10-CM

## 2015-10-08 DIAGNOSIS — F05 Delirium due to known physiological condition: Secondary | ICD-10-CM

## 2015-10-08 IMAGING — CT CT HEAD W/O CM
3 series · 15 of 44 positions shown, 18 images · non-contrast
Comparison: [DATE]

CLINICAL DATA: Increased forgetfulness.  Confusion.

EXAM:
CT HEAD WITHOUT CONTRAST
TECHNIQUE: Contiguous axial images were obtained from the base of the skull
through the vertex without intravenous contrast.

[Series 2: head 5.0 h37s · axial · 0.43mm/px · z∈[+149,+259]mm · 9 of 27 slices shown, 12 images]
[im 3/27  brain]
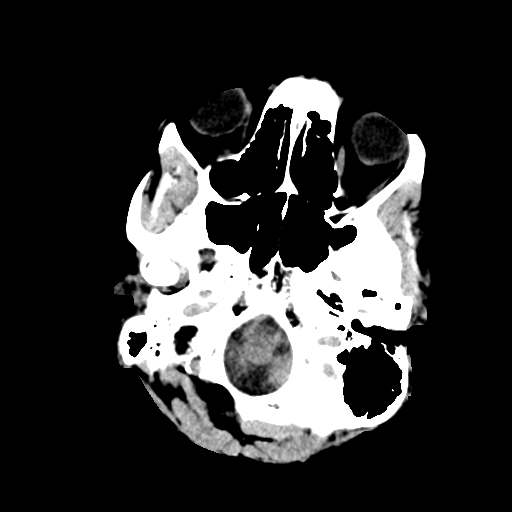
[im 3/27  bone]
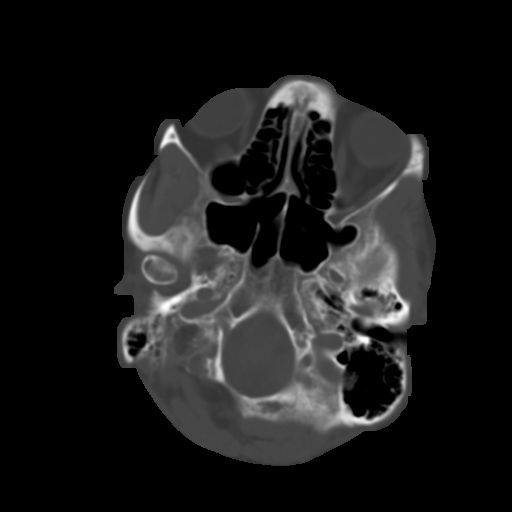
[im 6/27  brain]
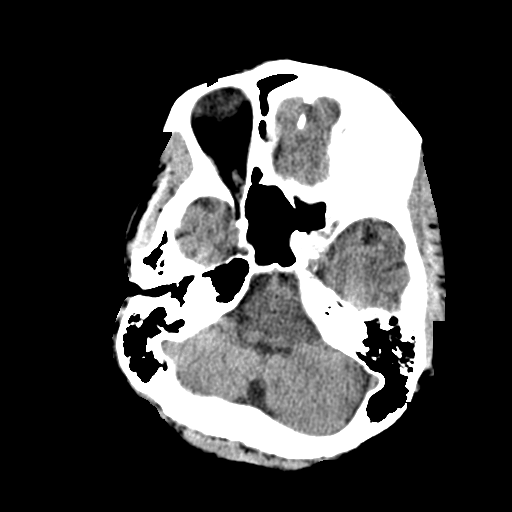
[im 8/27  brain]
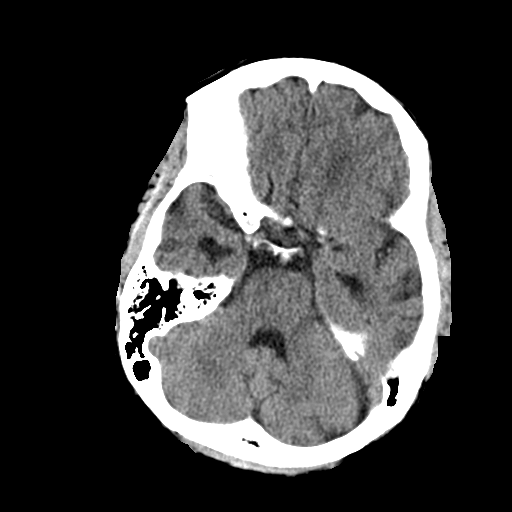
[im 11/27  brain]
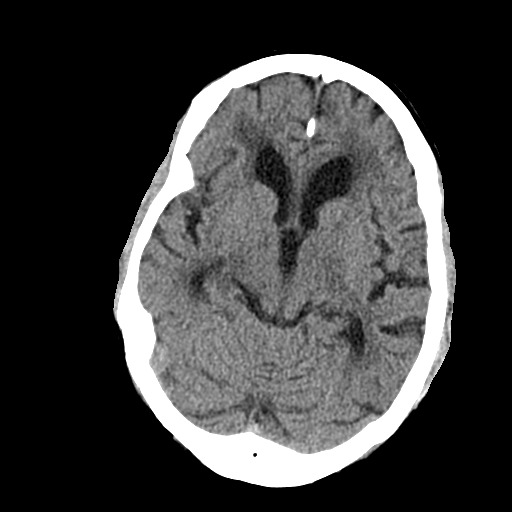
[im 14/27  brain]
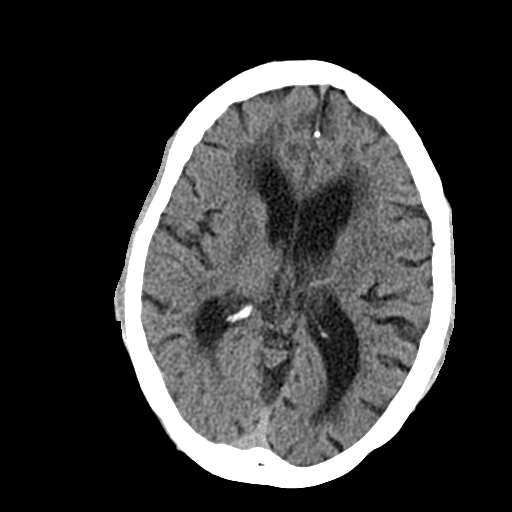
[im 14/27  bone]
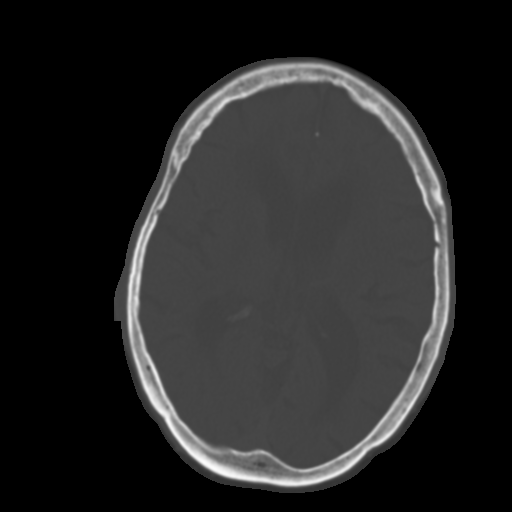
[im 17/27  brain]
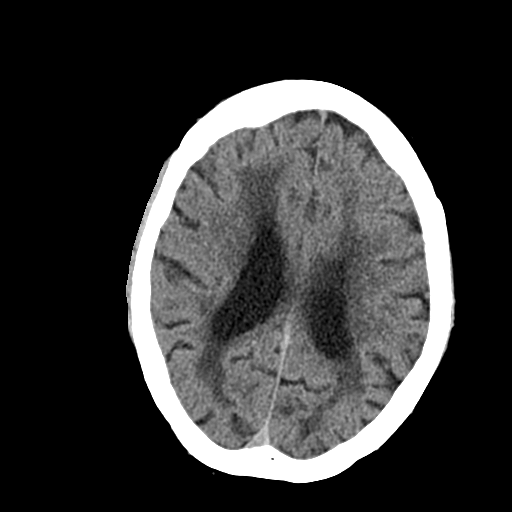
[im 20/27  brain]
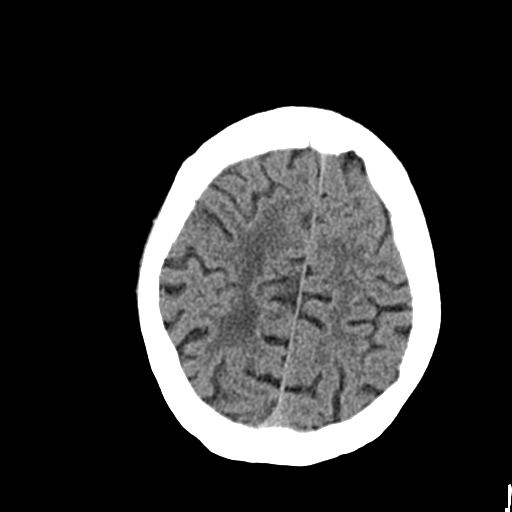
[im 22/27  brain]
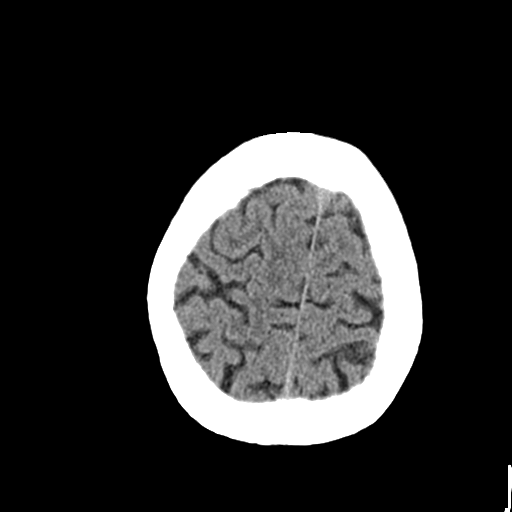
[im 25/27  brain]
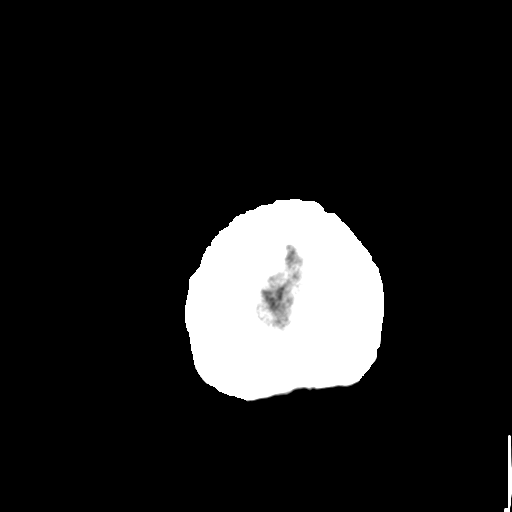
[im 25/27  bone]
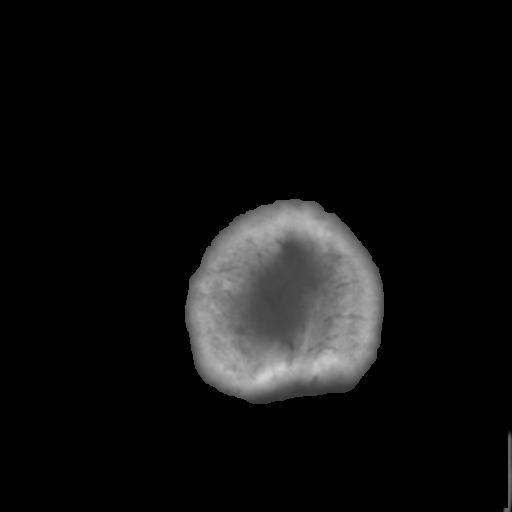

[Series 4: head 3.0 mpr cor · coronal · 0.28mm/px · 3 of 63 slices shown]
[im 21/63  brain]
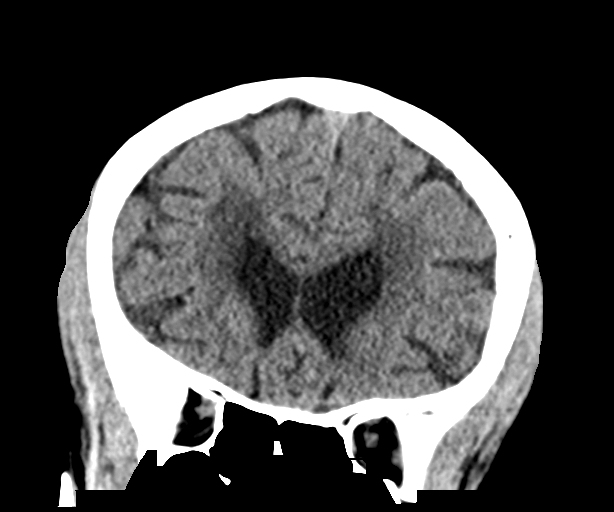
[im 28/63  brain]
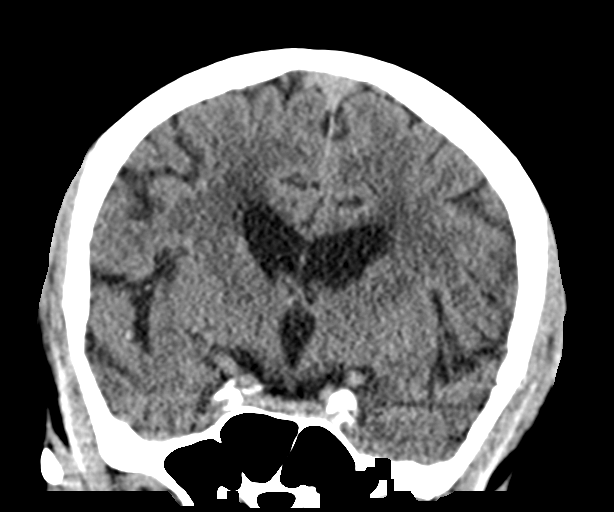
[im 35/63  brain]
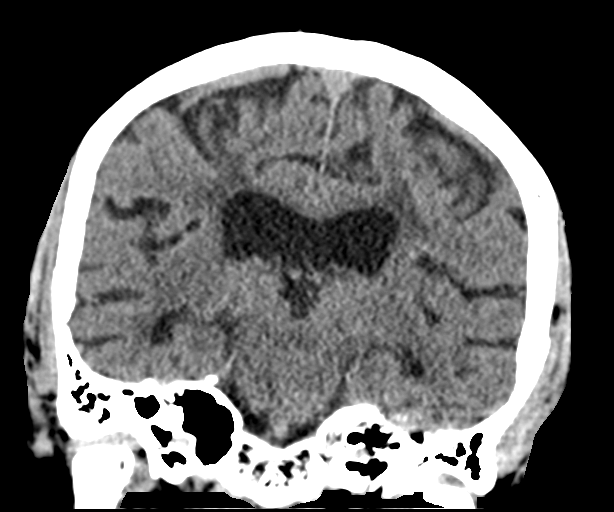

[Series 5: head 3.0 mpr sag · sagittal · 0.26mm/px · 3 of 53 slices shown]
[im 18/53  brain]
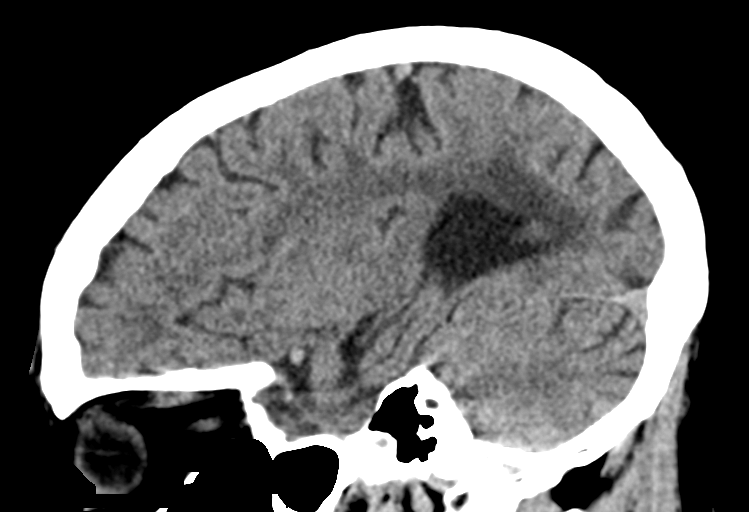
[im 27/53  brain]
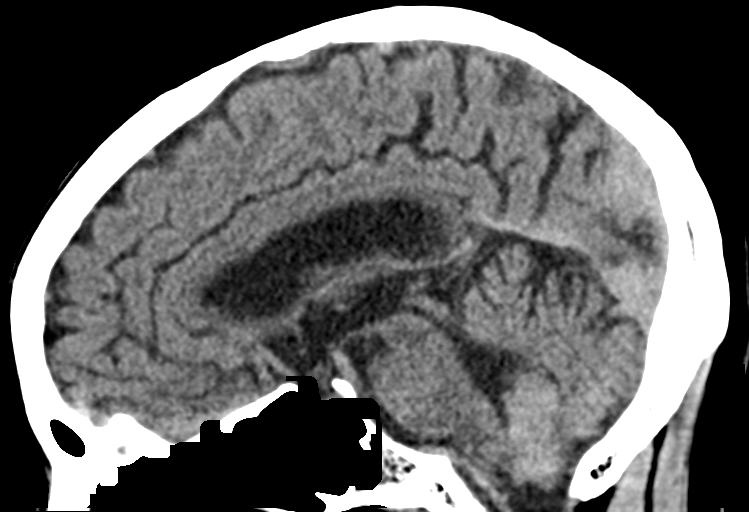
[im 35/53  brain]
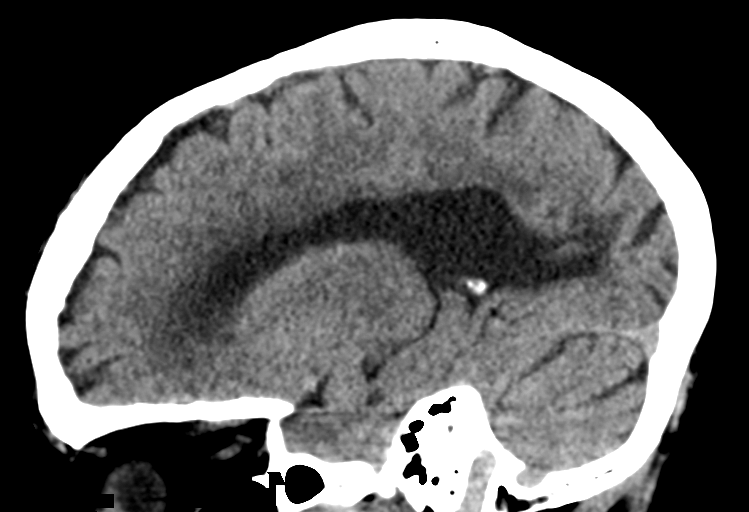

[15 of 44 positions shown; findings below may reference images not displayed]

FINDINGS: Brain: There is atrophy and chronic small vessel disease changes. No
acute intracranial abnormality. Specifically, no hemorrhage,
hydrocephalus, mass lesion, acute infarction, or significant
intracranial injury.

Vascular: No hyperdense vessel or unexpected calcification.

Skull: No acute calvarial abnormality.

Sinuses/Orbits: Visualized paranasal sinuses and mastoids clear.
Orbital soft tissues unremarkable.

Other: None
IMPRESSION: No acute intracranial abnormality.

Atrophy, chronic microvascular disease.

## 2015-12-18 ENCOUNTER — Other Ambulatory Visit (INDEPENDENT_AMBULATORY_CARE_PROVIDER_SITE_OTHER): Payer: Medicare Other

## 2015-12-18 DIAGNOSIS — Z0000 Encounter for general adult medical examination without abnormal findings: Secondary | ICD-10-CM

## 2015-12-18 LAB — BASIC METABOLIC PANEL
BUN: 11 mg/dL (ref 6–23)
CHLORIDE: 101 meq/L (ref 96–112)
CO2: 30 mEq/L (ref 19–32)
CREATININE: 0.78 mg/dL (ref 0.40–1.20)
Calcium: 9.8 mg/dL (ref 8.4–10.5)
GFR: 91.76 mL/min (ref >60.00)
GLUCOSE: 95 mg/dL (ref 70–99)
Potassium: 3.8 mEq/L (ref 3.5–5.1)
Sodium: 139 mEq/L (ref 135–145)

## 2015-12-18 LAB — CBC WITH DIFFERENTIAL/PLATELET
BASOS PCT: 0.4 % (ref 0.0–3.0)
Basophils Absolute: 0 10*3/uL (ref 0.0–0.1)
EOS ABS: 0.1 10*3/uL (ref 0.0–0.7)
Eosinophils Relative: 1.3 % (ref 0.0–5.0)
HCT: 47.2 % — ABNORMAL HIGH (ref 36.0–46.0)
Hemoglobin: 15.4 g/dL — ABNORMAL HIGH (ref 12.0–15.0)
LYMPHS ABS: 4.1 10*3/uL — AB (ref 0.7–4.0)
Lymphocytes Relative: 50 % — ABNORMAL HIGH (ref 12.0–46.0)
MCHC: 32.5 g/dL (ref 30.0–36.0)
MCV: 79.6 fl (ref 78.0–100.0)
MONO ABS: 0.5 10*3/uL (ref 0.1–1.0)
Monocytes Relative: 5.5 % (ref 3.0–12.0)
NEUTROS PCT: 42.8 % — AB (ref 43.0–77.0)
Neutro Abs: 3.5 10*3/uL (ref 1.4–7.7)
Platelets: 248 10*3/uL (ref 150.0–400.0)
RBC: 5.93 Mil/uL — ABNORMAL HIGH (ref 3.87–5.11)
RDW: 15.4 % (ref 11.5–15.5)
WBC: 8.2 10*3/uL (ref 4.0–10.5)

## 2015-12-18 LAB — TSH: TSH: 1.77 u[IU]/mL (ref 0.35–4.50)

## 2015-12-18 LAB — HEPATIC FUNCTION PANEL
ALT: 17 U/L (ref 0–35)
AST: 29 U/L (ref 0–37)
Albumin: 4.1 g/dL (ref 3.5–5.2)
Alkaline Phosphatase: 52 U/L (ref 39–117)
BILIRUBIN DIRECT: 0.2 mg/dL (ref 0.0–0.3)
BILIRUBIN TOTAL: 1.2 mg/dL (ref 0.2–1.2)
Total Protein: 7.4 g/dL (ref 6.0–8.3)

## 2015-12-18 LAB — LIPID PANEL
CHOL/HDL RATIO: 4
CHOLESTEROL: 227 mg/dL — AB (ref 0–200)
HDL: 60 mg/dL (ref >39.00)
LDL CALC: 150 mg/dL — AB (ref 0–99)
NonHDL: 167.18
TRIGLYCERIDES: 84 mg/dL (ref 0.0–149.0)
VLDL: 16.8 mg/dL (ref 0.0–40.0)

## 2015-12-25 ENCOUNTER — Encounter: Payer: Medicare Other | Admitting: Internal Medicine

## 2015-12-25 DIAGNOSIS — Z0289 Encounter for other administrative examinations: Secondary | ICD-10-CM

## 2016-01-03 LAB — POC URINALSYSI DIPSTICK (AUTOMATED)
BILIRUBIN UA: NEGATIVE
Glucose, UA: NEGATIVE
Ketones, UA: NEGATIVE
LEUKOCYTES UA: NEGATIVE
NITRITE UA: NEGATIVE
PH UA: 8
PROTEIN UA: NEGATIVE
RBC UA: NEGATIVE
Spec Grav, UA: 1.015
Urobilinogen, UA: 1

## 2016-01-07 ENCOUNTER — Ambulatory Visit (INDEPENDENT_AMBULATORY_CARE_PROVIDER_SITE_OTHER): Payer: Medicare Other | Admitting: Internal Medicine

## 2016-01-07 ENCOUNTER — Encounter: Payer: Self-pay | Admitting: Internal Medicine

## 2016-01-07 VITALS — BP 140/80 | HR 78 | Temp 98.1°F | Resp 20 | Ht 69.0 in | Wt 166.0 lb

## 2016-01-07 DIAGNOSIS — F05 Delirium due to known physiological condition: Secondary | ICD-10-CM

## 2016-01-07 DIAGNOSIS — Z0000 Encounter for general adult medical examination without abnormal findings: Secondary | ICD-10-CM | POA: Diagnosis not present

## 2016-01-07 DIAGNOSIS — R410 Disorientation, unspecified: Secondary | ICD-10-CM

## 2016-01-07 DIAGNOSIS — Z23 Encounter for immunization: Secondary | ICD-10-CM | POA: Diagnosis not present

## 2016-01-07 MED ORDER — DONEPEZIL HCL 5 MG PO TABS: 5.0000 mg | ORAL_TABLET | Freq: Every day | ORAL | Status: DC

## 2016-01-07 NOTE — Progress Notes (Signed)
Pre visit review using our clinic review tool, if applicable. No additional management support is needed unless otherwise documented below in the visit note. 

## 2016-01-07 NOTE — Progress Notes (Signed)
Subjective:    Patient ID: Nicole Blackwell, female    DOB: August 21, 1935, 80 y.o.   MRN: [id number]  HPI 80 year old patient who is seen today for a wellness exam She has a history of microvascular disease and cognitive impairment.  She was seen here 3 months ago and placed on Aricept 5 mg daily, which apparently she has not been taking. MMSE 22/30. Patient has no focal complaints today.  She takes no chronic medications other than daily aspirin  Past Medical History:  Diagnosis Date  . BACK PAIN   . OSTEOPOROSIS   . PLANTAR FASCIITIS   . Positive PPD   . SHINGLES   . SYMPTOMATIC MENOPAUSAL/FEMALE CLIMACTERIC STATES   . Syncope and collapse 12/13/2014  . [facility]terine cancer [facility])      Social History   Social History  . Marital status: Divorced    Spouse name: N/A  . Number of children: N/A  . Years of education: N/A   Occupational History  . Not on file.   Social History Main Topics  . Smoking status: Never Smoker  . Smokeless tobacco: Never Used  . Alcohol use No  . Drug use: No  . Sexual activity: No   Other Topics Concern  . Not on file   Social History Narrative  . No narrative on file    Past Surgical History:  Procedure Laterality Date  . CATARACT EXTRACTION, BILATERAL Bilateral   . DILATION AND CURETTAGE OF UTERUS  1960's X 2   "I lost 2 children to miscarriage"  . LAPAROSCOPIC CHOLECYSTECTOMY    . SIGMOIDOSCOPY    . TONSILLECTOMY    . VAGINAL HYSTERECTOMY      No family history on file.  Allergies  Allergen Reactions  . Penicillin G Benzathine Swelling  . Penicillins Swelling    Current Outpatient Prescriptions on File Prior to Visit  Medication Sig Dispense Refill  . acetaminophen (TYLENOL) 500 MG tablet Take 500 mg by mouth every 6 (six) hours as needed for mild pain.    Marland Kitchen aspirin 81 MG tablet Take 81 mg by mouth at bedtime.      Current Facility-Administered Medications on File Prior to Visit  Medication Dose Route Frequency Provider Last Rate  Last Dose  . donepezil (ARICEPT) tablet 5 mg  5 mg Oral QHS Marletta Lor, MD        BP 140/80 (BP Location: Left Arm, Patient Position: Sitting, Cuff Size: Normal)   Pulse 78   Temp 98.1 F (36.7 C) (Oral)   Resp 20   Ht 5\' 9"  (1.753 m)   Wt 166 lb (75.3 kg)   SpO2 99%   BMI 24.51 kg/m   Medicare wellness  1. Risk factors, based on past  M,S,F history.  No cardiovascular risk factors  2.  Physical activities:fairly sedentary but no activity restrictions  3.  Depression/mood:no history of major depression or mood disorder  4.  Hearing:no deficits  5.  ADL's:independent  6.  Fall risk:low  7.  Home safety:no problems identified  8.  Height weight, and visual acuity;height and weight stable no change in visual acuity  9.  Counseling:heart healthy diet.  Discussed and encouraged.  More rigorous exercise activities.  Encouraged  10. Lab orders based on risk factors:laboratory studies reviewed  11. Referral :an appropriate at this time 47. Care plan:continue efforts at aggressive risk factor modification.  Resume Aricept 5 mg daily  13. Cognitive assessment:  Alert and oriented, mild cognitive impairment.  MMSE  22/30  14. Screening: Patient provided with a written and personalized 5-10 year screening schedule in the AVS.    15. Provider List Update: primary care medicine radiology     Review of Systems  Constitutional: Negative.   HENT: Negative for congestion, dental problem, hearing loss, rhinorrhea, sinus pressure, sore throat and tinnitus.   Eyes: Negative for pain, discharge and visual disturbance.  Respiratory: Negative for cough and shortness of breath.   Cardiovascular: Negative for chest pain, palpitations and leg swelling.  Gastrointestinal: Negative for abdominal distention, abdominal pain, blood in stool, constipation, diarrhea, nausea and vomiting.  Genitourinary: Negative for difficulty urinating, dysuria, flank pain, frequency, hematuria, pelvic  pain, urgency, vaginal bleeding, vaginal discharge and vaginal pain.  Musculoskeletal: Negative for arthralgias, gait problem and joint swelling.  Skin: Negative for rash.  Neurological: Negative for dizziness, syncope, speech difficulty, weakness, numbness and headaches.  Hematological: Negative for adenopathy.  Psychiatric/Behavioral: Positive for behavioral problems and confusion. Negative for agitation and dysphoric mood. The patient is not nervous/anxious.        Objective:   Physical Exam  Constitutional: She is oriented to person, place, and time. She appears well-developed and well-nourished.  HENT:  Head: Normocephalic and atraumatic.  Right Ear: External ear normal.  Left Ear: External ear normal.  Mouth/Throat: Oropharynx is clear and moist.  Eyes: Conjunctivae and EOM are normal.  Neck: Normal range of motion. Neck supple. No JVD present. No thyromegaly present.  Cardiovascular: Normal rate, regular rhythm, normal heart sounds and intact distal pulses.   No murmur heard. Pulmonary/Chest: Effort normal and breath sounds normal. She has no wheezes. She has no rales.  Abdominal: Soft. Bowel sounds are normal. She exhibits no distension and no mass. There is no tenderness. There is no rebound and no guarding.  Musculoskeletal: Normal range of motion. She exhibits no edema or tenderness.  Neurological: She is alert and oriented to person, place, and time. She has normal reflexes. No cranial nerve deficit. She exhibits normal muscle tone. Coordination normal.  Skin: Skin is warm and dry. No rash noted.  Psychiatric: She has a normal mood and affect. Her behavior is normal.          Assessment & Plan:   Preventive health exam cognitive impairment.  We'll resume Aricept Screening lab reviewed We'll continue daily aspirin Heart healthy diet recommended  Follow-up 6 months  Samira Acero Pilar Plate

## 2016-01-07 NOTE — Patient Instructions (Signed)
It is important that you exercise regularly, at least 20 minutes 3 to 4 times per week.  If you develop chest pain or shortness of breath seek  medical attention.  Limit your sodium (Salt) intake  Return in 6 months for follow-up  Take a calcium supplement, plus [phone] units of vitamin D

## 2016-01-08 ENCOUNTER — Telehealth: Payer: Self-pay | Admitting: Internal Medicine

## 2016-01-08 MED ORDER — DONEPEZIL HCL 5 MG PO TABS
5.0000 mg | ORAL_TABLET | Freq: Every day | ORAL | 5 refills | Status: DC
Start: 2016-01-08 — End: 2016-09-26

## 2016-01-08 NOTE — Telephone Encounter (Signed)
Rx resent to pharmacy

## 2016-01-08 NOTE — Telephone Encounter (Signed)
Pt following up on refill  donepezil (ARICEPT) tablet 5 mg  [facility]/ [address]  Dr Raliegh Ip prescribed yesterday, and was supposed to be sent in. Can you resend?

## 2016-09-25 ENCOUNTER — Observation Stay (HOSPITAL_COMMUNITY)
Admission: EM | Admit: 2016-09-25 | Discharge: 2016-09-26 | Disposition: A | Discharge status: Home / Self Care | Payer: Medicare Other | Attending: Internal Medicine | Admitting: Internal Medicine

## 2016-09-25 ENCOUNTER — Encounter (HOSPITAL_COMMUNITY): Payer: Self-pay

## 2016-09-25 ENCOUNTER — Emergency Department (HOSPITAL_COMMUNITY): Payer: Medicare Other

## 2016-09-25 DIAGNOSIS — Z7982 Long term (current) use of aspirin: Secondary | ICD-10-CM | POA: Diagnosis not present

## 2016-09-25 DIAGNOSIS — R55 Syncope and collapse: Secondary | ICD-10-CM | POA: Diagnosis not present

## 2016-09-25 DIAGNOSIS — Z23 Encounter for immunization: Secondary | ICD-10-CM | POA: Insufficient documentation

## 2016-09-25 DIAGNOSIS — W1839XA Other fall on same level, initial encounter: Secondary | ICD-10-CM | POA: Insufficient documentation

## 2016-09-25 DIAGNOSIS — Z79899 Other long term (current) drug therapy: Secondary | ICD-10-CM | POA: Insufficient documentation

## 2016-09-25 DIAGNOSIS — E876 Hypokalemia: Secondary | ICD-10-CM | POA: Diagnosis not present

## 2016-09-25 DIAGNOSIS — Z8542 Personal history of malignant neoplasm of other parts of uterus: Secondary | ICD-10-CM | POA: Diagnosis not present

## 2016-09-25 DIAGNOSIS — M810 Age-related osteoporosis without current pathological fracture: Secondary | ICD-10-CM | POA: Insufficient documentation

## 2016-09-25 DIAGNOSIS — I50810 Right heart failure, unspecified: Secondary | ICD-10-CM | POA: Diagnosis not present

## 2016-09-25 DIAGNOSIS — R112 Nausea with vomiting, unspecified: Secondary | ICD-10-CM | POA: Diagnosis not present

## 2016-09-25 DIAGNOSIS — S0101XA Laceration without foreign body of scalp, initial encounter: Secondary | ICD-10-CM | POA: Diagnosis not present

## 2016-09-25 DIAGNOSIS — F0390 Unspecified dementia without behavioral disturbance: Secondary | ICD-10-CM | POA: Insufficient documentation

## 2016-09-25 LAB — BRAIN NATRIURETIC PEPTIDE: B Natriuretic Peptide: 51.9 pg/mL (ref 0.0–100.0)

## 2016-09-25 LAB — BASIC METABOLIC PANEL
Anion gap: 9 (ref 5–15)
BUN: 7 mg/dL (ref 6–20)
CALCIUM: 8.4 mg/dL — AB (ref 8.9–10.3)
CO2: 26 mmol/L (ref 22–32)
CREATININE: 0.83 mg/dL (ref 0.44–1.00)
Chloride: 108 mmol/L (ref 101–111)
GFR calc non Af Amer: >60 mL/min (ref >60)
Glucose, Bld: 103 mg/dL — ABNORMAL HIGH (ref 65–99)
Potassium: 3.1 mmol/L — ABNORMAL LOW (ref 3.5–5.1)
Sodium: 143 mmol/L (ref 135–145)

## 2016-09-25 LAB — I-STAT TROPONIN, ED: TROPONIN I, POC: 0.01 ng/mL (ref 0.00–0.08)

## 2016-09-25 LAB — CBC
HCT: 42.8 % (ref 36.0–46.0)
Hemoglobin: 13.2 g/dL (ref 12.0–15.0)
MCH: 25 pg — AB (ref 26.0–34.0)
MCHC: 30.8 g/dL (ref 30.0–36.0)
MCV: 81.2 fL (ref 78.0–100.0)
PLATELETS: 203 10*3/uL (ref 150–400)
RBC: 5.27 MIL/uL — AB (ref 3.87–5.11)
RDW: 15.1 % (ref 11.5–15.5)
WBC: 6.3 10*3/uL (ref 4.0–10.5)

## 2016-09-25 IMAGING — CT CT HEAD W/O CM
4 of 8 series · 15 of 47 positions shown, 18 images · non-contrast
Comparison: Head CT [DATE]

CLINICAL DATA: Pt was standing in line at the bank and passed out
and hit the back of her head. Pt then rolled over and vomited. Pt
only complaining of the back of head hurting. Pt DOINA she does not
remember falling. H/O syncope and collapse

EXAM:
CT HEAD WITHOUT CONTRAST
CT CERVICAL SPINE WITHOUT CONTRAST
TECHNIQUE: Multidetector CT imaging of the head and cervical spine was
performed following the standard protocol without intravenous
contrast. Multiplanar CT image reconstructions of the cervical spine
were also generated.

[Series 5: head 5.0 h30s · axial · 0.44mm/px · z∈[-49,-14]mm · 2 of 29 slices shown]
[im 8/29  brain]
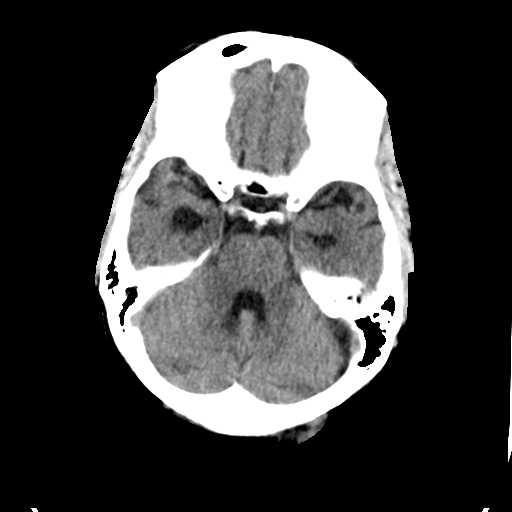
[im 15/29  brain]
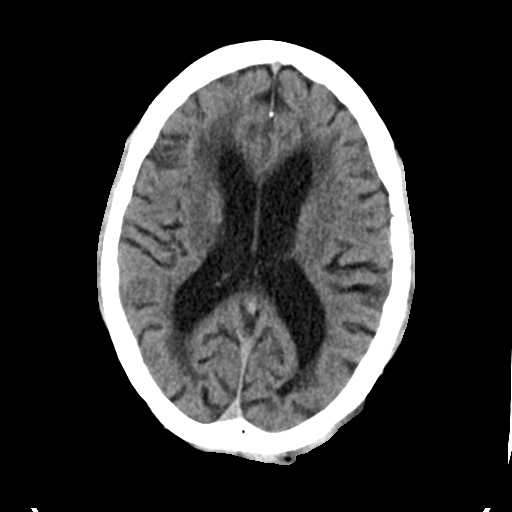

[Series 7: head 3.0 mpr cor · coronal · 0.28mm/px · 2 of 69 slices shown]
[im 23/69  brain]
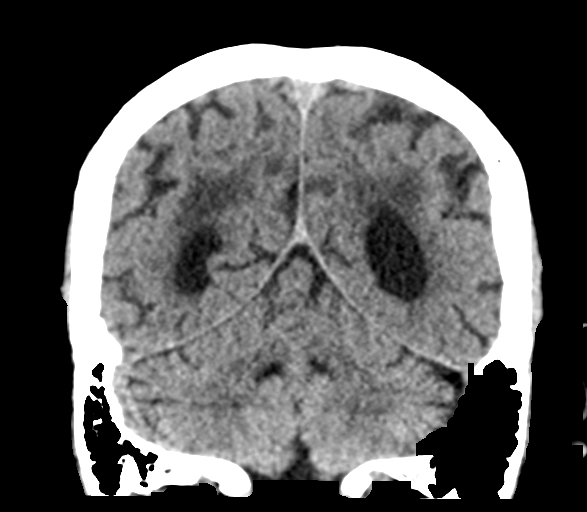
[im 46/69  brain]
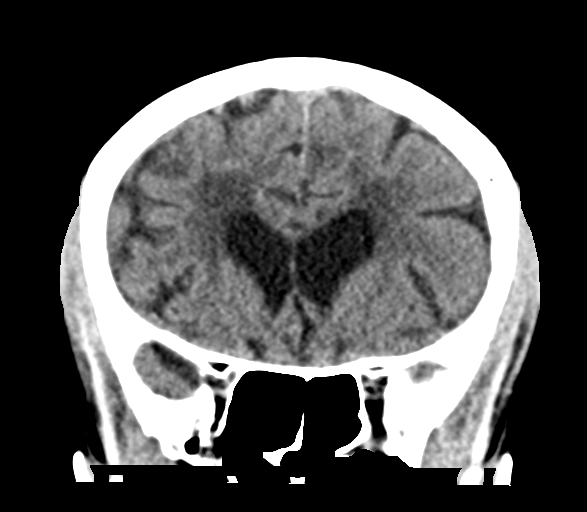

[Series 8: head 3.0 mpr sag · sagittal · 0.29mm/px · 1 of 58 slices shown]
[im 29/58  brain]
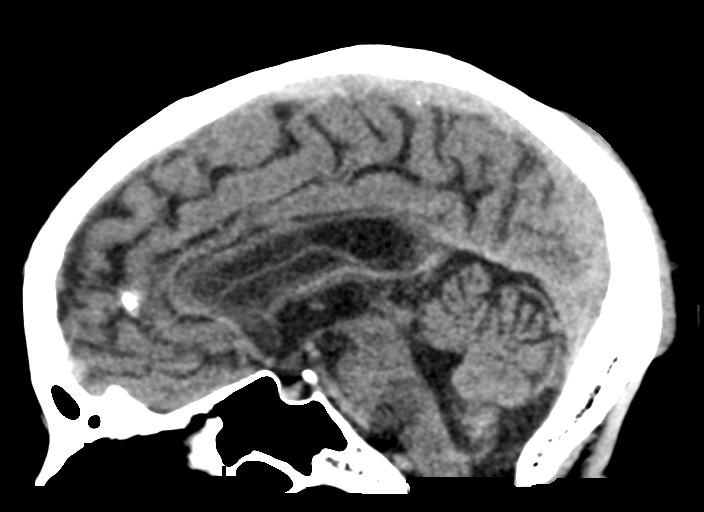

[Series 14: orthogonal axial st · axial · 0.21mm/px · z∈[-236,-118]mm · 10 of 79 slices shown, 13 images]
[im 8/79  brain]
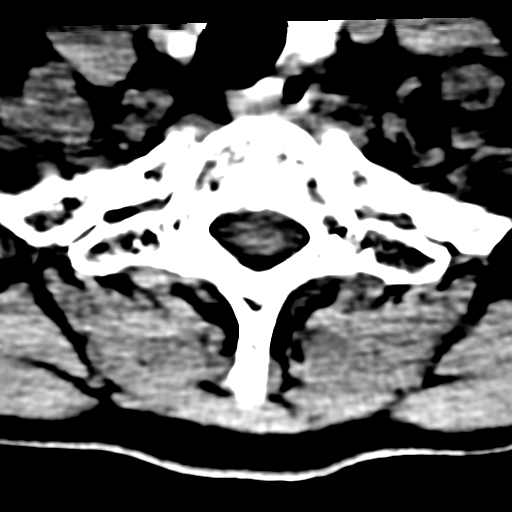
[im 8/79  bone]
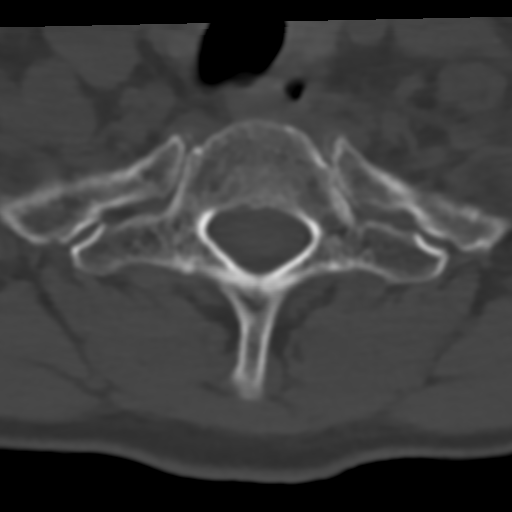
[im 15/79  brain]
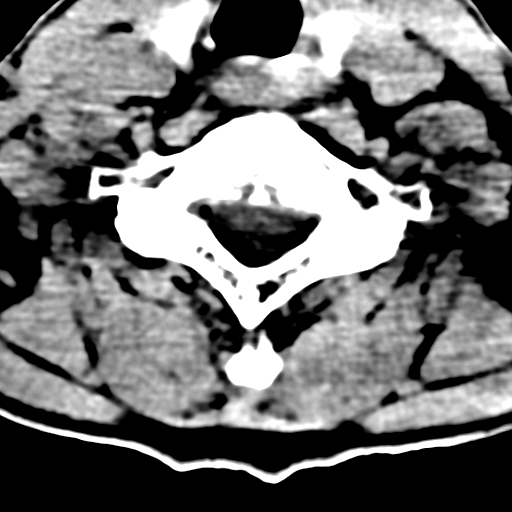
[im 22/79  brain]
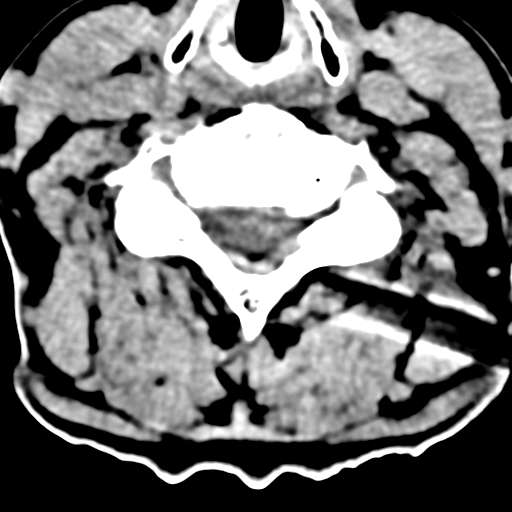
[im 29/79  brain]
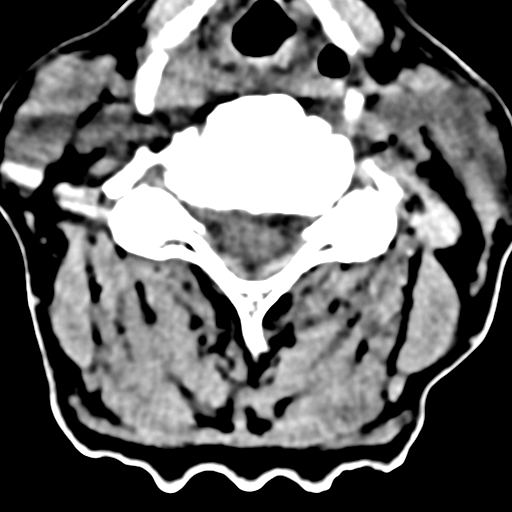
[im 36/79  brain]
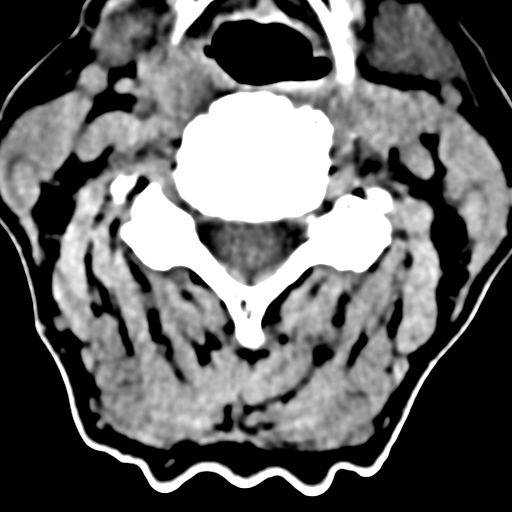
[im 36/79  bone]
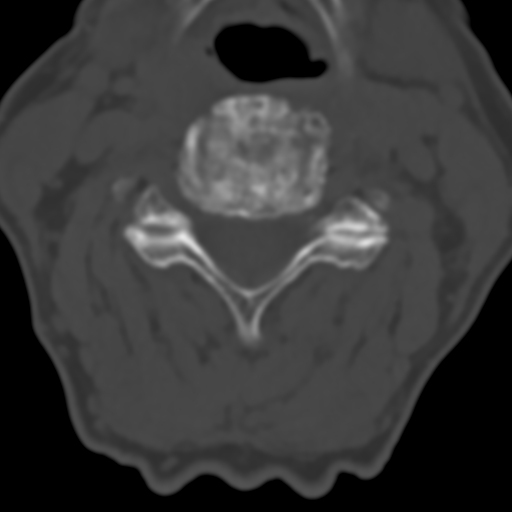
[im 43/79  brain]
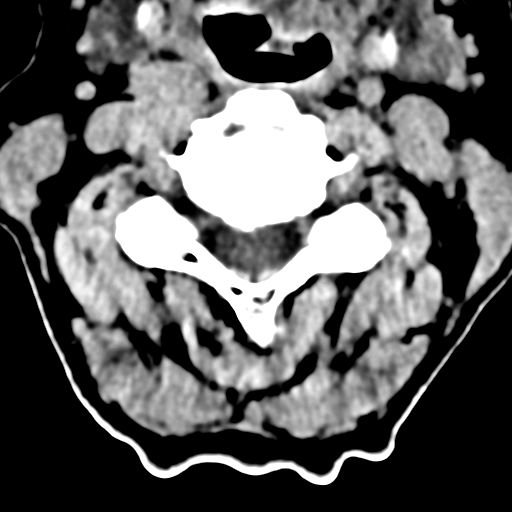
[im 50/79  brain]
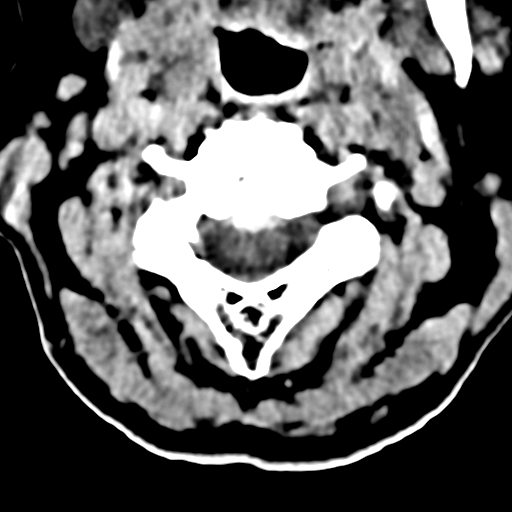
[im 57/79  brain]
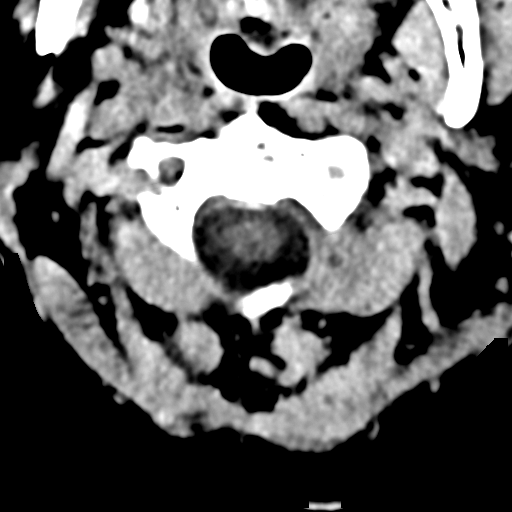
[im 64/79  brain]
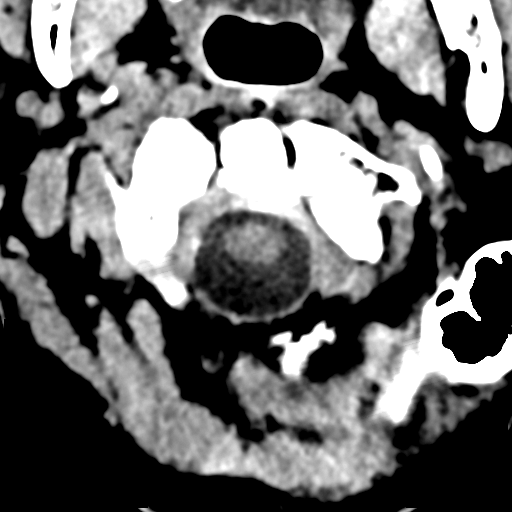
[im 64/79  bone]
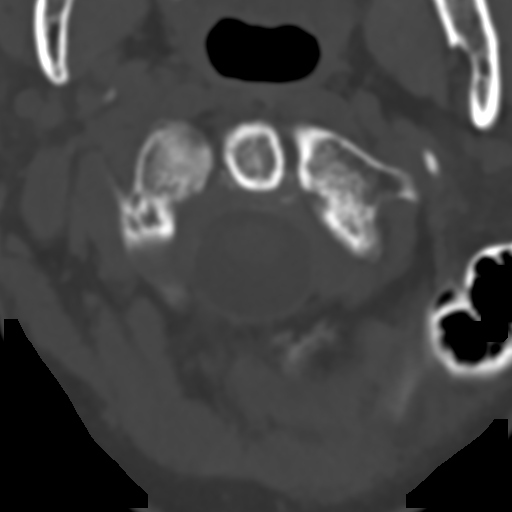
[im 71/79  brain]
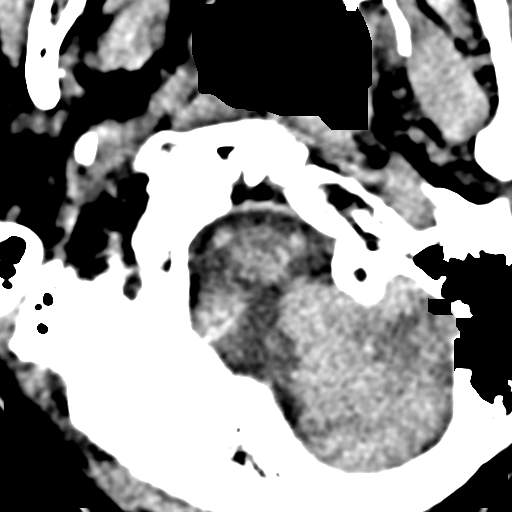

[15 of 47 positions shown; findings below may reference images not displayed]

FINDINGS: CT HEAD FINDINGS

Brain: No intracranial hemorrhage. No parenchymal contusion. No
midline shift or mass effect. Basilar cisterns are patent. No skull
base fracture. No fluid in the paranasal sinuses or mastoid air
cells. Orbits are normal.

There are periventricular and subcortical white matter
hypodensities. Generalized cortical atrophy.

Vascular: No hyperdense vessel or unexpected calcification.

Skull: No skull fracture

Sinuses/Orbits: No fluid in the paranasal sinuses mastoid air cells
are clear.

Other: Scalp hematoma posterior to the occipital bone without
evidence skull fracture.

CT CERVICAL SPINE FINDINGS

Alignment: Normal alignment of the cervical vertebral bodies. There
is multiple levels of joint space narrowing and endplate
osteophytosis.

Skull base and vertebrae: Normal craniocervical junction. No loss of
bowel vertebral body height or disc height. Normal facet
articulation. No evidence of fracture.

Soft tissues and spinal canal: No prevertebral soft tissue swelling.
No perispinal or epidural hematoma.

Disc levels: Multiple levels of joint space narrowing and
osteophytosis.

Upper chest: Clear

Other: None
IMPRESSION: 1. No intracranial trauma.
2. Posterior scalp hematoma without skull fracture.
3. Atrophy and  white matter microvascular disease unchanged.
4. No cervical spine fracture.
5. Multilevel disc osteophytic disease.

## 2016-09-25 MED ORDER — SODIUM CHLORIDE 0.9% FLUSH: 3.0000 mL | INTRAVENOUS | Status: DC | PRN

## 2016-09-25 MED ORDER — ACETAMINOPHEN 500 MG PO TABS: 500.0000 mg | ORAL_TABLET | Freq: Four times a day (QID) | ORAL | Status: DC | PRN

## 2016-09-25 MED ORDER — TETANUS-DIPHTH-ACELL PERTUSSIS 5-2.5-18.5 LF-MCG/0.5 IM SUSP
0.5000 mL | Freq: Once | INTRAMUSCULAR | Status: AC
  Administered 2016-09-25: 0.5 mL via INTRAMUSCULAR
  Filled 2016-09-25: qty 0.5

## 2016-09-25 MED ORDER — ACETAMINOPHEN 650 MG RE SUPP: 650.0000 mg | Freq: Four times a day (QID) | RECTAL | Status: DC | PRN

## 2016-09-25 MED ORDER — ASPIRIN EC 81 MG PO TBEC
81.0000 mg | DELAYED_RELEASE_TABLET | Freq: Every day | ORAL | Status: DC
  Administered 2016-09-26: 81 mg via ORAL
  Filled 2016-09-25: qty 1

## 2016-09-25 MED ORDER — ACETAMINOPHEN 325 MG PO TABS
650.0000 mg | ORAL_TABLET | Freq: Four times a day (QID) | ORAL | Status: DC | PRN
  Administered 2016-09-26: 650 mg via ORAL
  Filled 2016-09-25: qty 2

## 2016-09-25 MED ORDER — SODIUM CHLORIDE 0.9% FLUSH
3.0000 mL | Freq: Two times a day (BID) | INTRAVENOUS | Status: DC
  Administered 2016-09-26 (×2): 3 mL via INTRAVENOUS

## 2016-09-25 MED ORDER — HYDROCODONE-ACETAMINOPHEN 5-325 MG PO TABS: 1.0000 | ORAL_TABLET | ORAL | Status: DC | PRN

## 2016-09-25 MED ORDER — HEPARIN SODIUM (PORCINE) 5000 UNIT/ML IJ SOLN: 5000.0000 [IU] | Freq: Three times a day (TID) | INTRAMUSCULAR | Status: DC

## 2016-09-25 MED ORDER — SODIUM CHLORIDE 0.9 % IV SOLN: 250.0000 mL | INTRAVENOUS | Status: DC | PRN

## 2016-09-25 MED ORDER — ONDANSETRON HCL 4 MG/2ML IJ SOLN: 4.0000 mg | Freq: Four times a day (QID) | INTRAMUSCULAR | Status: DC | PRN

## 2016-09-25 MED ORDER — ONDANSETRON HCL 4 MG PO TABS: 4.0000 mg | ORAL_TABLET | Freq: Four times a day (QID) | ORAL | Status: DC | PRN

## 2016-09-25 NOTE — ED Triage Notes (Signed)
Pt was standing in line at the bank and passed out and hit the back of her head. Pt then rolled over and vomited. Pt only complaining of the back of head hurting.

## 2016-09-25 NOTE — ED Notes (Signed)
Called mainlab to add BNP

## 2016-09-25 NOTE — H&P (Signed)
[facility]                                                                                    Patient Demographics  Nicole Blackwell, is a 81 y.o. female  CSN: [id number]  MRN: [id number]  DOB - 17-Apr-1935  Admit Date - 09/25/2016  Outpatient Primary MD for the patient is Marletta Lor, MD   With History of -  Past Medical History:  Diagnosis Date  . BACK PAIN   . OSTEOPOROSIS   . PLANTAR FASCIITIS   . Positive PPD   . SHINGLES   . SYMPTOMATIC MENOPAUSAL/FEMALE CLIMACTERIC STATES   . Syncope and collapse 12/13/2014  . Uterine cancer [facility])       Past Surgical History:  Procedure Laterality Date  . CATARACT EXTRACTION, BILATERAL Bilateral   . DILATION AND CURETTAGE OF UTERUS  1960's X 2   "I lost 2 children to miscarriage"  . LAPAROSCOPIC CHOLECYSTECTOMY    . SIGMOIDOSCOPY    . TONSILLECTOMY    . VAGINAL HYSTERECTOMY      in for   Chief Complaint  Patient presents with  . Loss of Consciousness     HPI  Nicole Blackwell  is a 81 y.o. female, With past medical history significant for syncope episode in 2016 with negative workup presenting with a syncopal episode that happened a few hours prior to presentation with resulting fall, scalp laceration with nausea and vomiting. Patient denies any preceding chest pains palpitations or headaches. Patient lives alone with few hours a day help from her daughter.    Review of Systems    In addition to the HPI above No Fever-chills,  No changes with Vision or hearing, No problems swallowing food or Liquids, No Chest pain, Cough or Shortness of Breath, No Abdominal pain,  Bowel movements are regular, No Blood in stool or Urine, No dysuria, No new skin rashes or bruises, No new joints pains-aches,  No new weakness, tingling, numbness in any extremity, No recent weight gain or loss, No polyuria, polydypsia or polyphagia, No significant Mental Stressors.  A full 10 point Review of Systems was done, except  as stated above, all other Review of Systems were negative.   Social History Social History  Substance Use Topics  . Smoking status: Never Smoker  . Smokeless tobacco: Never Used  . Alcohol use No    Family History History reviewed. No pertinent family history.   Prior to Admission medications   Medication Sig Start Date End Date Taking? Authorizing Provider  acetaminophen (TYLENOL) 500 MG tablet Take 500 mg by mouth every 6 (six) hours as needed for mild pain.   Yes [provider]  aspirin 81 MG tablet Take 81 mg by mouth at bedtime.    Yes [provider]  donepezil (ARICEPT) 5 MG tablet Take 1 tablet (5 mg total) by mouth at bedtime. Patient not taking: Reported on 09/25/2016 01/08/16   Marletta Lor, MD    Allergies  Allergen Reactions  . Penicillin G Benzathine Swelling  . Penicillins Swelling    Physical Exam  Vitals  Blood pressure (!) 124/110, pulse 67, resp. rate (!) 22,  height 5\' 9"  (1.753 m), SpO2 100 %.   1. General Well-developed, well-nourished female very pleasant,  2. Normal affect and insight, Not Suicidal or Homicidal, Awake Alert, Oriented X 3.  3. No F.N deficits, patient moving all extremities and has intact sensation  4. Ears and Eyes appear Normal, Conjunctivae clear, PERRLA. Moist Oral Mucosa. Laceration noted the back part of the scalp  5. Supple Neck, No JVD, No cervical lymphadenopathy appriciated, No Carotid Bruits.  6. Symmetrical Chest wall movement, Good air movement bilaterally, CTAB.  7. RRR, No Gallops, Rubs or Murmurs, No Parasternal Heave.  8. Positive Bowel Sounds, Abdomen Soft, Non tender, No organomegaly appriciated,No rebound -guarding or rigidity.  9.  No Cyanosis, Normal Skin Turgor, No Skin Rash or Bruise.  10. Good muscle tone,  joints appear normal , no effusions, Normal ROM.    Data Review  CBC  Recent Labs Lab 09/25/16 1435  WBC 6.3  HGB 13.2  HCT 42.8  PLT 203  MCV 81.2  MCH  25.0*  MCHC 30.8  RDW 15.1   ------------------------------------------------------------------------------------------------------------------  Chemistries   Recent Labs Lab 09/25/16 1435  NA 143  K 3.1*  CL 108  CO2 26  GLUCOSE 103*  BUN 7  CREATININE 0.83  CALCIUM 8.4*   ------------------------------------------------------------------------------------------------------------------ CrCl cannot be calculated (Unknown ideal weight.). ------------------------------------------------------------------------------------------------------------------ No results for input(s): TSH, T4TOTAL, T3FREE, THYROIDAB in the last 72 hours.  Invalid input(s): FREET3   Coagulation profile No results for input(s): INR, PROTIME in the last 168 hours. ------------------------------------------------------------------------------------------------------------------- No results for input(s): DDIMER in the last 72 hours. -------------------------------------------------------------------------------------------------------------------  Cardiac Enzymes No results for input(s): CKMB, TROPONINI, MYOGLOBIN in the last 168 hours.  Invalid input(s): CK ------------------------------------------------------------------------------------------------------------------ Invalid input(s): POCBNP   ---------------------------------------------------------------------------------------------------------------  Urinalysis    Component Value Date/Time   COLORURINE YELLOW 12/13/2014 [place] 12/13/2014 1133   LABSPEC 1.017 12/13/2014 1133   PHURINE 7.5 12/13/2014 1133   GLUCOSEU NEGATIVE 12/13/2014 1133   HGBUR NEGATIVE 12/13/2014 1133   BILIRUBINUR n 01/03/2016 1048   KETONESUR NEGATIVE 12/13/2014 1133   PROTEINUR n 01/03/2016 1048   PROTEINUR NEGATIVE 12/13/2014 1133   UROBILINOGEN 1.0 01/03/2016 1048   UROBILINOGEN 1.0 12/13/2014 1133   NITRITE n 01/03/2016 1048   NITRITE  NEGATIVE 12/13/2014 1133   LEUKOCYTESUR Negative 01/03/2016 1048    ----------------------------------------------------------------------------------------------------------------     Imaging results:   Ct Head Wo Contrast  Result Date: 09/25/2016 CLINICAL DATA:  Pt was standing in line at the bank and passed out and hit the back of her head. Pt then rolled over and vomited. Pt only complaining of the back of head hurting. Pt sts she does not remember falling. H/O syncope and collapse EXAM: CT HEAD WITHOUT CONTRAST CT CERVICAL SPINE WITHOUT CONTRAST TECHNIQUE: Multidetector CT imaging of the head and cervical spine was performed following the standard protocol without intravenous contrast. Multiplanar CT image reconstructions of the cervical spine were also generated. COMPARISON:  Head CT 10/08/2015 FINDINGS: CT HEAD FINDINGS Brain: No intracranial hemorrhage. No parenchymal contusion. No midline shift or mass effect. Basilar cisterns are patent. No skull base fracture. No fluid in the paranasal sinuses or mastoid air cells. Orbits are normal. There are periventricular and subcortical white matter hypodensities. Generalized cortical atrophy. Vascular: No hyperdense vessel or unexpected calcification. Skull: No skull fracture Sinuses/Orbits: No fluid in the paranasal sinuses mastoid air cells are clear. Other: Scalp hematoma posterior to the occipital bone without evidence skull fracture. CT CERVICAL SPINE FINDINGS Alignment: Normal  alignment of the cervical vertebral bodies. There is multiple levels of joint space narrowing and endplate osteophytosis. Skull base and vertebrae: Normal craniocervical junction. No loss of bowel vertebral body height or disc height. Normal facet articulation. No evidence of fracture. Soft tissues and spinal canal: No prevertebral soft tissue swelling. No perispinal or epidural hematoma. Disc levels: Multiple levels of joint space narrowing and osteophytosis. Upper chest:  Clear Other: None IMPRESSION: 1. No intracranial trauma. 2. Posterior scalp hematoma without skull fracture. 3. Atrophy and  white matter microvascular disease unchanged. 4. No cervical spine fracture. 5. Multilevel disc osteophytic disease. Electronically Signed   By: Suzy Bouchard M.D.   On: 09/25/2016 16:50   Ct Cervical Spine Wo Contrast  Result Date: 09/25/2016 CLINICAL DATA:  Pt was standing in line at the bank and passed out and hit the back of her head. Pt then rolled over and vomited. Pt only complaining of the back of head hurting. Pt sts she does not remember falling. H/O syncope and collapse EXAM: CT HEAD WITHOUT CONTRAST CT CERVICAL SPINE WITHOUT CONTRAST TECHNIQUE: Multidetector CT imaging of the head and cervical spine was performed following the standard protocol without intravenous contrast. Multiplanar CT image reconstructions of the cervical spine were also generated. COMPARISON:  Head CT 10/08/2015 FINDINGS: CT HEAD FINDINGS Brain: No intracranial hemorrhage. No parenchymal contusion. No midline shift or mass effect. Basilar cisterns are patent. No skull base fracture. No fluid in the paranasal sinuses or mastoid air cells. Orbits are normal. There are periventricular and subcortical white matter hypodensities. Generalized cortical atrophy. Vascular: No hyperdense vessel or unexpected calcification. Skull: No skull fracture Sinuses/Orbits: No fluid in the paranasal sinuses mastoid air cells are clear. Other: Scalp hematoma posterior to the occipital bone without evidence skull fracture. CT CERVICAL SPINE FINDINGS Alignment: Normal alignment of the cervical vertebral bodies. There is multiple levels of joint space narrowing and endplate osteophytosis. Skull base and vertebrae: Normal craniocervical junction. No loss of bowel vertebral body height or disc height. Normal facet articulation. No evidence of fracture. Soft tissues and spinal canal: No prevertebral soft tissue swelling. No  perispinal or epidural hematoma. Disc levels: Multiple levels of joint space narrowing and osteophytosis. Upper chest: Clear Other: None IMPRESSION: 1. No intracranial trauma. 2. Posterior scalp hematoma without skull fracture. 3. Atrophy and  white matter microvascular disease unchanged. 4. No cervical spine fracture. 5. Multilevel disc osteophytic disease. Electronically Signed   By: Suzy Bouchard M.D.   On: 09/25/2016 16:50    My personal review of EKG: Normal sinus rhythm at 50 bpm, left bundle branch block  Assessment & Plan  1. Syncope 2. Head laceration 3. Early dementia  Plan Place in observation Neurochecks Serial troponins Check echocardiogram D/C Aricept   DVT Prophylaxis Heparin  AM Labs Ordered, also please review Full Orders    Code Status full  Disposition Plan: Home with home health  Time spent in minutes : 42 minutes  Condition GUARDED   @SIGNATURE @

## 2016-09-25 NOTE — ED Provider Notes (Signed)
15: 43-she presents for follow-up consistent with vasovagal episode, injuring the back of her head, and followed by vomiting.  She has pain in the head and upper neck, and is bleeding from the posterior scalp.  The patient is lucid, cooperative and able to move arms and legs equally.  There is no tenderness of the chest wall.  There is no respiratory distress.  Screening evaluation including EKG, labs, and advanced imaging with CT scan ordered.  Tetanus booster ordered.  She will require further wound assessment of the posterior scalp.  Care transferred to oncoming provider team.   Daleen Bo, MD 09/25/16 [phone]

## 2016-09-25 NOTE — ED Notes (Signed)
Pt ambulated to restroom, no issues. Pt placed back on monitor. 

## 2016-09-25 NOTE — ED Provider Notes (Signed)
[place] DEPT Provider Note   CSN: [id number] Arrival date & time: 09/25/16  1432     History   Chief Complaint Chief Complaint  Patient presents with  . Loss of Consciousness    HPI Nicole Blackwell is a 81 y.o. female.  Patient was at the bank today and talking to somebody and she passed out. Patient did not remember being dizzy at all. This happened to her before a few years ago. Patient patient has no complaints now   The history is provided by the patient. No language interpreter was used.  Loss of Consciousness   This is a recurrent problem. The current episode started 3 to 5 hours ago. The problem occurs rarely. The problem has been resolved. She lost consciousness for a period of 1 to 5 minutes. The problem is associated with normal activity. Pertinent negatives include abdominal pain, back pain, chest pain, congestion, headaches, seizures and visual change.    Past Medical History:  Diagnosis Date  . BACK PAIN   . OSTEOPOROSIS   . PLANTAR FASCIITIS   . Positive PPD   . SHINGLES   . SYMPTOMATIC MENOPAUSAL/FEMALE CLIMACTERIC STATES   . Syncope and collapse 12/13/2014  . Uterine cancer [facility])     Patient Active Problem List   Diagnosis Date Noted  . Diastolic CHF ([place]) 78/29/5621  . Syncope 01/12/2012  . BACK PAIN 04/30/2008  . SHINGLES 09/27/2007  . SYMPTOMATIC MENOPAUSAL/FEMALE CLIMACTERIC STATES 06/20/2007  . Osteoporosis 03/11/2007  . PLANTAR FASCIITIS 11/16/2006    Past Surgical History:  Procedure Laterality Date  . CATARACT EXTRACTION, BILATERAL Bilateral   . DILATION AND CURETTAGE OF UTERUS  1960's X 2   "I lost 2 children to miscarriage"  . LAPAROSCOPIC CHOLECYSTECTOMY    . SIGMOIDOSCOPY    . TONSILLECTOMY    . VAGINAL HYSTERECTOMY      OB History    No data available       Home Medications    Prior to Admission medications   Medication Sig Start Date End Date Taking? Authorizing Provider  acetaminophen (TYLENOL) 500 MG tablet Take  500 mg by mouth every 6 (six) hours as needed for mild pain.   Yes [provider]  aspirin 81 MG tablet Take 81 mg by mouth at bedtime.    Yes [provider]  donepezil (ARICEPT) 5 MG tablet Take 1 tablet (5 mg total) by mouth at bedtime. Patient not taking: Reported on 09/25/2016 01/08/16   Marletta Lor, MD    Family History History reviewed. No pertinent family history.  Social History Social History  Substance Use Topics  . Smoking status: Never Smoker  . Smokeless tobacco: Never Used  . Alcohol use No     Allergies   Penicillin g benzathine and Penicillins   Review of Systems Review of Systems  Constitutional: Negative for appetite change and fatigue.  HENT: Negative for congestion, ear discharge and sinus pressure.   Eyes: Negative for discharge.  Respiratory: Negative for cough.   Cardiovascular: Positive for syncope. Negative for chest pain.  Gastrointestinal: Negative for abdominal pain and diarrhea.  Genitourinary: Negative for frequency and hematuria.  Musculoskeletal: Negative for back pain.  Skin: Negative for rash.  Neurological: Positive for syncope. Negative for seizures and headaches.  Psychiatric/Behavioral: Negative for hallucinations.     Physical Exam Updated Vital Signs BP (!) 181/83 (BP Location: Left Arm)   Pulse 72   Resp 14   Ht 5\' 9"  (1.753 m)   SpO2  100%   Physical Exam  Constitutional: She is oriented to person, place, and time. She appears well-developed.  HENT:  Head: Normocephalic.  Eyes: Conjunctivae and EOM are normal. No scleral icterus.  Neck: Neck supple. No thyromegaly present.  Cardiovascular: Normal rate and regular rhythm.  Exam reveals no gallop and no friction rub.   No murmur heard. Pulmonary/Chest: No stridor. She has no wheezes. She has no rales. She exhibits no tenderness.  Abdominal: She exhibits no distension. There is no tenderness. There is no rebound.  Musculoskeletal: Normal range of  motion. She exhibits no edema.  Lymphadenopathy:    She has no cervical adenopathy.  Neurological: She is oriented to person, place, and time. She exhibits normal muscle tone. Coordination normal.  Skin: No rash noted. No erythema.  Psychiatric: She has a normal mood and affect. Her behavior is normal.     ED Treatments / Results  Labs (all labs ordered are listed, but only abnormal results are displayed) Labs Reviewed  BASIC METABOLIC PANEL - Abnormal; Notable for the following:       Result Value   Potassium 3.1 (*)    Glucose, Bld 103 (*)    Calcium 8.4 (*)    All other components within normal limits  CBC - Abnormal; Notable for the following:    RBC 5.27 (*)    MCH 25.0 (*)    All other components within normal limits  BRAIN NATRIURETIC PEPTIDE  I-STAT TROPONIN, ED    EKG  EKG Interpretation  Date/Time:  Friday September 25 2016 14:39:30 EDT Ventricular Rate:  58 PR Interval:    QRS Duration: 157 QT Interval:  479 QTC Calculation: 471 R Axis:   47 Text Interpretation:  Sinus rhythm Left bundle branch block since last tracing no significant change Confirmed by Daleen Bo [phone]) on 09/25/2016 3:39:52 PM Also confirmed by Milton Ferguson [phone])  on 09/25/2016 4:29:57 PM       Radiology Ct Head Wo Contrast  Result Date: 09/25/2016 CLINICAL DATA:  Pt was standing in line at the bank and passed out and hit the back of her head. Pt then rolled over and vomited. Pt only complaining of the back of head hurting. Pt sts she does not remember falling. H/O syncope and collapse EXAM: CT HEAD WITHOUT CONTRAST CT CERVICAL SPINE WITHOUT CONTRAST TECHNIQUE: Multidetector CT imaging of the head and cervical spine was performed following the standard protocol without intravenous contrast. Multiplanar CT image reconstructions of the cervical spine were also generated. COMPARISON:  Head CT 10/08/2015 FINDINGS: CT HEAD FINDINGS Brain: No intracranial hemorrhage. No parenchymal contusion. No  midline shift or mass effect. Basilar cisterns are patent. No skull base fracture. No fluid in the paranasal sinuses or mastoid air cells. Orbits are normal. There are periventricular and subcortical white matter hypodensities. Generalized cortical atrophy. Vascular: No hyperdense vessel or unexpected calcification. Skull: No skull fracture Sinuses/Orbits: No fluid in the paranasal sinuses mastoid air cells are clear. Other: Scalp hematoma posterior to the occipital bone without evidence skull fracture. CT CERVICAL SPINE FINDINGS Alignment: Normal alignment of the cervical vertebral bodies. There is multiple levels of joint space narrowing and endplate osteophytosis. Skull base and vertebrae: Normal craniocervical junction. No loss of bowel vertebral body height or disc height. Normal facet articulation. No evidence of fracture. Soft tissues and spinal canal: No prevertebral soft tissue swelling. No perispinal or epidural hematoma. Disc levels: Multiple levels of joint space narrowing and osteophytosis. Upper chest: Clear Other: None IMPRESSION: 1. No  intracranial trauma. 2. Posterior scalp hematoma without skull fracture. 3. Atrophy and  white matter microvascular disease unchanged. 4. No cervical spine fracture. 5. Multilevel disc osteophytic disease. Electronically Signed   By: Suzy Bouchard M.D.   On: 09/25/2016 16:50   Ct Cervical Spine Wo Contrast  Result Date: 09/25/2016 CLINICAL DATA:  Pt was standing in line at the bank and passed out and hit the back of her head. Pt then rolled over and vomited. Pt only complaining of the back of head hurting. Pt sts she does not remember falling. H/O syncope and collapse EXAM: CT HEAD WITHOUT CONTRAST CT CERVICAL SPINE WITHOUT CONTRAST TECHNIQUE: Multidetector CT imaging of the head and cervical spine was performed following the standard protocol without intravenous contrast. Multiplanar CT image reconstructions of the cervical spine were also generated. COMPARISON:   Head CT 10/08/2015 FINDINGS: CT HEAD FINDINGS Brain: No intracranial hemorrhage. No parenchymal contusion. No midline shift or mass effect. Basilar cisterns are patent. No skull base fracture. No fluid in the paranasal sinuses or mastoid air cells. Orbits are normal. There are periventricular and subcortical white matter hypodensities. Generalized cortical atrophy. Vascular: No hyperdense vessel or unexpected calcification. Skull: No skull fracture Sinuses/Orbits: No fluid in the paranasal sinuses mastoid air cells are clear. Other: Scalp hematoma posterior to the occipital bone without evidence skull fracture. CT CERVICAL SPINE FINDINGS Alignment: Normal alignment of the cervical vertebral bodies. There is multiple levels of joint space narrowing and endplate osteophytosis. Skull base and vertebrae: Normal craniocervical junction. No loss of bowel vertebral body height or disc height. Normal facet articulation. No evidence of fracture. Soft tissues and spinal canal: No prevertebral soft tissue swelling. No perispinal or epidural hematoma. Disc levels: Multiple levels of joint space narrowing and osteophytosis. Upper chest: Clear Other: None IMPRESSION: 1. No intracranial trauma. 2. Posterior scalp hematoma without skull fracture. 3. Atrophy and  white matter microvascular disease unchanged. 4. No cervical spine fracture. 5. Multilevel disc osteophytic disease. Electronically Signed   By: Suzy Bouchard M.D.   On: 09/25/2016 16:50    Procedures Procedures (including critical care time)  Medications Ordered in ED Medications  Tdap (BOOSTRIX) injection 0.5 mL (0.5 mLs Intramuscular Given 09/25/16 1638)     Initial Impression / Assessment and Plan / ED Course  I have reviewed the triage vital signs and the nursing notes.  Pertinent labs & imaging results that were available during my care of the patient were reviewed by me and considered in my medical decision making (see chart for details).      Patient will be admitted for syncope  Final Clinical Impressions(s) / ED Diagnoses   Final diagnoses:  Syncope and collapse    New Prescriptions New Prescriptions   No medications on file     Milton Ferguson, MD 09/25/16 2144

## 2016-09-25 NOTE — ED Notes (Signed)
Attempted report 

## 2016-09-26 ENCOUNTER — Observation Stay (HOSPITAL_BASED_OUTPATIENT_CLINIC_OR_DEPARTMENT_OTHER): Payer: Medicare Other

## 2016-09-26 DIAGNOSIS — R55 Syncope and collapse: Secondary | ICD-10-CM

## 2016-09-26 DIAGNOSIS — E876 Hypokalemia: Secondary | ICD-10-CM

## 2016-09-26 DIAGNOSIS — I50812 Chronic right heart failure: Secondary | ICD-10-CM

## 2016-09-26 DIAGNOSIS — I361 Nonrheumatic tricuspid (valve) insufficiency: Secondary | ICD-10-CM

## 2016-09-26 LAB — ECHOCARDIOGRAM COMPLETE
E decel time: 204 ms
E/e' ratio: 9.29
FS: 28 % (ref 28–44)
Height: 69 in
IVS/LV PW RATIO, ED: 1.54
LA ID, A-P, ES: 39 mm
LA diam end sys: 39 mm
LA diam index: 2.14 cm/m2
LA vol A4C: 38 mL
LA vol index: 25 mL/m2
LA vol: 45.5 mL
LV E/e' medial: 9.29
LV E/e'average: 9.29
LV PW d: 9.22 mm — AB (ref 0.6–1.1)
LV e' LATERAL: 7.62 cm/s
LVOT area: 2.54 cm2
LVOT diameter: 18 mm
Lateral S' vel: 13.8 cm/s
MV Dec: 204
MV Peak grad: 2 mmHg
MV pk A vel: 75.7 m/s
MV pk E vel: 70.8 m/s
PV Reg vel dias: 59.7 cm/s
RV sys press: 35 mmHg
Reg peak vel: 281 cm/s
TAPSE: 20.3 mm
TDI e' lateral: 7.62
TDI e' medial: 6.31
TR max vel: 281 cm/s
Weight: 2408 [oz_av]

## 2016-09-26 LAB — CBC
HCT: 44.3 % (ref 36.0–46.0)
Hemoglobin: 13.9 g/dL (ref 12.0–15.0)
MCH: 25.5 pg — ABNORMAL LOW (ref 26.0–34.0)
MCHC: 31.4 g/dL (ref 30.0–36.0)
MCV: 81.3 fL (ref 78.0–100.0)
Platelets: 234 K/uL (ref 150–400)
RBC: 5.45 MIL/uL — ABNORMAL HIGH (ref 3.87–5.11)
RDW: 15.6 % — ABNORMAL HIGH (ref 11.5–15.5)
WBC: 6.8 K/uL (ref 4.0–10.5)

## 2016-09-26 LAB — PHOSPHORUS: Phosphorus: 3.3 mg/dL (ref 2.5–4.6)

## 2016-09-26 LAB — COMPREHENSIVE METABOLIC PANEL
ALT: 15 U/L (ref 14–54)
AST: 27 U/L (ref 15–41)
Albumin: 3.2 g/dL — ABNORMAL LOW (ref 3.5–5.0)
Alkaline Phosphatase: 46 U/L (ref 38–126)
Anion gap: 9 (ref 5–15)
BUN: 8 mg/dL (ref 6–20)
CHLORIDE: 104 mmol/L (ref 101–111)
CO2: 28 mmol/L (ref 22–32)
CREATININE: 0.75 mg/dL (ref 0.44–1.00)
Calcium: 8.9 mg/dL (ref 8.9–10.3)
Glucose, Bld: 106 mg/dL — ABNORMAL HIGH (ref 65–99)
POTASSIUM: 3.4 mmol/L — AB (ref 3.5–5.1)
Sodium: 141 mmol/L (ref 135–145)
Total Bilirubin: 2.2 mg/dL — ABNORMAL HIGH (ref 0.3–1.2)
Total Protein: 6.2 g/dL — ABNORMAL LOW (ref 6.5–8.1)

## 2016-09-26 LAB — MAGNESIUM: MAGNESIUM: 2.2 mg/dL (ref 1.7–2.4)

## 2016-09-26 LAB — TROPONIN I
Troponin I: <0.03 ng/mL
Troponin I: <0.03 ng/mL
Troponin I: <0.03 ng/mL (ref <0.03)

## 2016-09-26 MED ORDER — POTASSIUM CHLORIDE CRYS ER 20 MEQ PO TBCR
40.0000 meq | EXTENDED_RELEASE_TABLET | Freq: Two times a day (BID) | ORAL | Status: DC
  Administered 2016-09-26: 40 meq via ORAL
  Filled 2016-09-26: qty 2

## 2016-09-26 MED ORDER — WHITE PETROLATUM GEL
Status: AC
  Administered 2016-09-26: 17:00:00
  Filled 2016-09-26: qty 1

## 2016-09-26 MED ORDER — SODIUM CHLORIDE 0.9 % IV SOLN
INTRAVENOUS | Status: DC
  Administered 2016-09-26: 12:00:00 via INTRAVENOUS

## 2016-09-26 MED ORDER — ENSURE ENLIVE PO LIQD: 237.0000 mL | Freq: Two times a day (BID) | ORAL | 12 refills | Status: DC

## 2016-09-26 MED ORDER — POTASSIUM CHLORIDE CRYS ER 20 MEQ PO TBCR
40.0000 meq | EXTENDED_RELEASE_TABLET | Freq: Three times a day (TID) | ORAL | Status: DC
  Administered 2016-09-26: 40 meq via ORAL
  Filled 2016-09-26: qty 2

## 2016-09-26 MED ORDER — ENSURE ENLIVE PO LIQD: 237.0000 mL | Freq: Two times a day (BID) | ORAL | Status: DC

## 2016-09-26 NOTE — Progress Notes (Signed)
OT Cancellation Note  Patient Details Name: MAURIA ASQUITH MRN: [id number] DOB: 11-22-1935   Cancelled Treatment:    Reason Eval/Treat Not Completed: Other (comment) Pt states she is at baseline and does need therapy at this time. OT will sign off.  Redmond Baseman, MS, OTR/L 09/26/2016, 3:57 PM

## 2016-09-26 NOTE — Care Management Note (Signed)
Case Management Note  Patient Details  Name: Nicole Blackwell MRN: [id number] Date of Birth: 01/10/1936  Subjective/Objective:                 Patient with order to DC to home today. Chart reviewed. No Home Health or Equipment needs, no unacknowledged Case Management consults or medication needs identified at the time of this note. Plan for DC to home. If needs arise today prior to discharge, please call Carles Collet RN CM at [phone].    Action/Plan:   Expected Discharge Date:  09/26/16               Expected Discharge Plan:  Home/Self Care  In-House Referral:     Discharge planning Services  CM Consult  Post Acute Care Choice:    Choice offered to:     DME Arranged:    DME Agency:     HH Arranged:    HH Agency:     Status of Service:  Completed, signed off  If discussed at [employer] of Stay Meetings, dates discussed:    Additional Comments:  Carles Collet, RN 09/26/2016, 5:10 PM

## 2016-09-26 NOTE — Progress Notes (Signed)
Nutrition Brief Note  Patient identified on the Malnutrition Screening Tool (MST) Report  Wt Readings from Last 15 Encounters:  09/25/16 150 lb 8 oz (68.3 kg)  01/07/16 166 lb (75.3 kg)  10/04/15 168 lb (76.2 kg)  12/21/14 161 lb (73 kg)  12/14/14 162 lb 3.2 oz (73.6 kg)  08/10/14 160 lb (72.6 kg)  09/20/12 180 lb (81.6 kg)  07/08/12 184 lb (83.5 kg)  06/30/12 182 lb (82.6 kg)  02/11/12 182 lb (82.6 kg)  01/12/12 182 lb (82.6 kg)  08/31/11 183 lb (83 kg)  10/20/10 186 lb (84.4 kg)  09/30/10 180 lb (81.6 kg)  09/16/10 185 lb (83.9 kg)   Body mass index is 22.22 kg/m. Patient meets criteria for Healthy wt for ht based on current BMI.   RD arrived shortly after MD. Patient will likely discharge today.   Briefly discussed wt history and appetite with patient. She notes that she does not "eat like she used to". She says she is happy that she has lost weight though. She says her daughter is always on her to eat more due to losing weight.   RD discussed how as people age they naturally start to lose their sense of hunger and thirst. So while she may not be underweight now, RD is more worried about the trend downward. She should try to be cognizant of her weight and how much she eats at home. Recommended calorically dense items, such as peanuts, to help her maintain weight. Also emphasized hydration as insufficient fluids can cause syncopal events. She says she drinks 8 glasses of water a day.   She says she promises to eat and drink when she goes home.   No nutrition interventions warranted at this time. If nutrition issues arise, please consult RD.   Burtis Junes RD, LDN, CNSC Clinical Nutrition Pager: [phone] 09/26/2016 4:08 PM

## 2016-09-26 NOTE — Progress Notes (Signed)
PT Cancellation/Discharge Note  Patient Details Name: Nicole Blackwell MRN: [id number] DOB: 02/11/1936   Cancelled Treatment:    Reason Eval/Treat Not Completed: Other (comment) (Pt states she is at baseline for mobility ) Does not want PT. Encouraged pt to ambulate in halls with staff members to be sure she is at baseline. States she has been walking in the room without difficulty.  PT will sign-off.  If there is a change in status please reorder. Thanks    Melvern Banker 09/26/2016, 10:53 AM Lavonia Dana, PT  [phone] 09/26/2016

## 2016-09-26 NOTE — Progress Notes (Signed)
  Echocardiogram 2D Echocardiogram has been performed.  Shanvi Moyd G Axxel Gude 09/26/2016, 12:22 PM

## 2016-09-26 NOTE — Progress Notes (Signed)
Physician Discharge Summary  TIMBER MARSHMAN FIE:[id number] DOB: 1935-09-17 DOA: 09/25/2016  PCP: Marletta Lor, MD  Admit date: 09/25/2016 Discharge date: 09/26/2016  Admitted From: Home Disposition:  Home; Patient refused PT and was seen walking in Room just fine by staff  Recommendations for Outpatient Follow-up:  1. Follow up with PCP in 1-2 weeks 2. Follow up with Cardiology as an outpatient: Cardiology Clinic should call to schedule you an appointment 3. Follow up with Neurology as an outpatient; Ambulatory Referral to Neurology Clinic made and they will call to schedule you an appointment 4. Please obtain CMP/CBC, Mag, Phos in one week  Home Health: No Equipment/Devices: None  Discharge Condition: Stable  CODE STATUS: FULL CODE Diet recommendation:   Brief/Interim Summary: Shanesha Bednarz  is a 81 y.o. female with a past medical history significant for syncopal episode in 2016 with negative workup presenting with a syncopal episode that happened a few hours prior to presentation with resulting fall, scalp laceration with nausea and vomiting. Patient denies any preceding chest pains palpitations or headaches. Patient lives alone with few hours a day help from her daughter. She was at [facility] talking to someone when she became acutely sick and had a syncopal episode. She was brought in and evaluated and had no definitive cause for her syncope. ECHO was done and showed that the patient had evidence of Right Heart Failure and after discussion with the Cardiologist she will need an outpatient TEE and Cardiology follow up which will be arranged by Cardiology. She was deemed medically stable to D/C home and will follow up with PCP, Cardiology, and Neurology as an outpatient.   Discharge Diagnoses:  Active Problems:   Syncope  Syncope and Collapse, suspect Vasovagal episode given N/V -Head and Nec CT showed No intracranial trauma. Posterior scalp hematoma without skull fracture. Atrophy and   white matter microvascular disease unchanged. No cervical spine fracture.Multilevel disc osteophytic disease. -Cardiac Troponin <0.03 -ECHO showed Normal Systolic Fxn -Orthostatics Done and not Orthostatic -PT consulted but patient refused and was seen walking in room without assistance -Given mild IVF rehydration -Neurochecks done; Aricept D/C'd -Follow up with PCP, Neurology and Cardiology in the outpatient setting   Hypokalemia -Replete prior to D/C -Repeat CMP as an outpatient   Right Heart Overload -Evidenced by ECHO -Discussed with Cardiologist who read ECHO and recommends TEE eventually -Can do as an Outpatient -Cardiology to follow patient in the outpatient Setting and will call to schedule evaluation with the patient  N/V -Improved  Discharge Instructions  Discharge Instructions    Ambulatory referral to Neurology    Complete by:  As directed    An appointment is requested in approximately: 1-2 weeks for Recurrent Syncope follow up   Call MD for:  difficulty breathing, headache or visual disturbances    Complete by:  As directed    Call MD for:  extreme fatigue    Complete by:  As directed    Call MD for:  hives    Complete by:  As directed    Call MD for:  persistant dizziness or light-headedness    Complete by:  As directed    Call MD for:  persistant nausea and vomiting    Complete by:  As directed    Call MD for:  redness, tenderness, or signs of infection (pain, swelling, redness, odor or green/yellow discharge around incision site)    Complete by:  As directed    Call MD for:  severe uncontrolled pain  Complete by:  As directed    Call MD for:  temperature >100.4    Complete by:  As directed    Diet - low sodium heart healthy    Complete by:  As directed    Discharge instructions    Complete by:  As directed    Follow up with PCP and Cardiology as an outpatient. Follow up with Neurology as an outpatient as well. If symptoms change or worsen please  return to the ED for evaluation.   Increase activity slowly    Complete by:  As directed      Allergies as of 09/26/2016      Reactions   Penicillin G Benzathine Swelling   Penicillins Swelling      Medication List    STOP taking these medications   donepezil 5 MG tablet Commonly known as:  ARICEPT     TAKE these medications   acetaminophen 500 MG tablet Commonly known as:  TYLENOL Take 500 mg by mouth every 6 (six) hours as needed for mild pain.   aspirin 81 MG tablet Take 81 mg by mouth at bedtime.   feeding supplement (ENSURE ENLIVE) Liqd Take 237 mLs by mouth 2 (two) times daily between meals.      Follow-up Information    Marletta Lor, MD. Call in 1 week(s).   Specialty:  Internal Medicine Why:  Call to schedule an appointment within 1 week Contact information: [place] [address] [phone]        [facility] Follow up.   Why:  If not called for an appointment within 1 week call [phone] to schedule an appointment.  Contact information: [place] [address] [phone]       [facility] Follow up.   Why:  Referral has been made; Please follow up with them if they have not called you for an appointment.  Contact information: [place], [place] [address] [phone]         Allergies  Allergen Reactions  . Penicillin G Benzathine Swelling  . Penicillins Swelling   Consultations:  None  Procedures/Studies: Ct Head Wo Contrast  Result Date: 09/25/2016 CLINICAL DATA:  Pt was standing in line at the bank and passed out and hit the back of her head. Pt then rolled over and vomited. Pt only complaining of the back of head hurting. Pt sts she does not remember falling. H/O syncope and collapse EXAM: CT HEAD WITHOUT CONTRAST CT CERVICAL SPINE WITHOUT CONTRAST TECHNIQUE:  Multidetector CT imaging of the head and cervical spine was performed following the standard protocol without intravenous contrast. Multiplanar CT image reconstructions of the cervical spine were also generated. COMPARISON:  Head CT 10/08/2015 FINDINGS: CT HEAD FINDINGS Brain: No intracranial hemorrhage. No parenchymal contusion. No midline shift or mass effect. Basilar cisterns are patent. No skull base fracture. No fluid in the paranasal sinuses or mastoid air cells. Orbits are normal. There are periventricular and subcortical white matter hypodensities. Generalized cortical atrophy. Vascular: No hyperdense vessel or unexpected calcification. Skull: No skull fracture Sinuses/Orbits: No fluid in the paranasal sinuses mastoid air cells are clear. Other: Scalp hematoma posterior to the occipital bone without evidence skull fracture. CT CERVICAL SPINE FINDINGS Alignment: Normal alignment of the cervical vertebral bodies. There is multiple levels of joint space narrowing and endplate osteophytosis. Skull base and vertebrae: Normal craniocervical junction. No loss of bowel vertebral body height  or disc height. Normal facet articulation. No evidence of fracture. Soft tissues and spinal canal: No prevertebral soft tissue swelling. No perispinal or epidural hematoma. Disc levels: Multiple levels of joint space narrowing and osteophytosis. Upper chest: Clear Other: None IMPRESSION: 1. No intracranial trauma. 2. Posterior scalp hematoma without skull fracture. 3. Atrophy and  white matter microvascular disease unchanged. 4. No cervical spine fracture. 5. Multilevel disc osteophytic disease. Electronically Signed   By: Suzy Bouchard M.D.   On: 09/25/2016 16:50   Ct Cervical Spine Wo Contrast  Result Date: 09/25/2016 CLINICAL DATA:  Pt was standing in line at the bank and passed out and hit the back of her head. Pt then rolled over and vomited. Pt only complaining of the back of head hurting. Pt sts she does not remember  falling. H/O syncope and collapse EXAM: CT HEAD WITHOUT CONTRAST CT CERVICAL SPINE WITHOUT CONTRAST TECHNIQUE: Multidetector CT imaging of the head and cervical spine was performed following the standard protocol without intravenous contrast. Multiplanar CT image reconstructions of the cervical spine were also generated. COMPARISON:  Head CT 10/08/2015 FINDINGS: CT HEAD FINDINGS Brain: No intracranial hemorrhage. No parenchymal contusion. No midline shift or mass effect. Basilar cisterns are patent. No skull base fracture. No fluid in the paranasal sinuses or mastoid air cells. Orbits are normal. There are periventricular and subcortical white matter hypodensities. Generalized cortical atrophy. Vascular: No hyperdense vessel or unexpected calcification. Skull: No skull fracture Sinuses/Orbits: No fluid in the paranasal sinuses mastoid air cells are clear. Other: Scalp hematoma posterior to the occipital bone without evidence skull fracture. CT CERVICAL SPINE FINDINGS Alignment: Normal alignment of the cervical vertebral bodies. There is multiple levels of joint space narrowing and endplate osteophytosis. Skull base and vertebrae: Normal craniocervical junction. No loss of bowel vertebral body height or disc height. Normal facet articulation. No evidence of fracture. Soft tissues and spinal canal: No prevertebral soft tissue swelling. No perispinal or epidural hematoma. Disc levels: Multiple levels of joint space narrowing and osteophytosis. Upper chest: Clear Other: None IMPRESSION: 1. No intracranial trauma. 2. Posterior scalp hematoma without skull fracture. 3. Atrophy and  white matter microvascular disease unchanged. 4. No cervical spine fracture. 5. Multilevel disc osteophytic disease. Electronically Signed   By: Suzy Bouchard M.D.   On: 09/25/2016 16:50    ECHOCARDIOGRAM Study Conclusions  - Left ventricle: The cavity size was normal. Systolic function was   normal. Wall motion was normal; there  were no regional wall   motion abnormalities. Doppler parameters are consistent with   abnormal left ventricular relaxation (grade 1 diastolic   dysfunction). - Ventricular septum: The contour showed diastolic flattening.   These changes are consistent with RV volume overload. - Left atrium: The atrium was mildly dilated. - Right ventricle: The cavity size was mildly dilated. Wall   thickness was normal. - Right atrium: The atrium was moderately dilated. - Pulmonary arteries: Systolic pressure was mildly increased. PA   peak pressure: 35 mm Hg (S).  Impressions:  - There is evidence for right heart volume overload, without   significant pulmonic or tricuspid valve insufficiency. There is   no clear evidence for atrial septal defect. These are chronic   abnormalities. Consider evaluation for occult left to right shunt   such as partial anomalous pulmonary vein return of ignored ASD.  Subjective: Seen and examined and felt good. No N/V or lightheadedness or dizziness. No CP or SOB and ready to go home.   Discharge Exam: Vitals:  09/26/16 1550 09/26/16 1551  BP: (!) 150/62 140/67  Pulse: 73 75  Resp: 18 18  Temp:     Vitals:   09/26/16 0520 09/26/16 1547 09/26/16 1550 09/26/16 1551  BP: (!) 141/66 (!) 146/78 (!) 150/62 140/67  Pulse: 64 75 73 75  Resp: 17 18 18 18   Temp: 98.6 F (37 C) 98 F (36.7 C)    TempSrc: Oral Oral    SpO2: 100% 100% 100% 98%  Weight:      Height:       General: Pt is alert, awake, not in acute distress Cardiovascular: RRR, S1/S2 +, no rubs, no gallops Respiratory: CTA bilaterally, no wheezing, no rhonchi Abdominal: Soft, NT, ND, bowel sounds + Extremities: no edema, no cyanosis  The results of significant diagnostics from this hospitalization (including imaging, microbiology, ancillary and laboratory) are listed below for reference.    Microbiology: No results found for this or any previous visit (from the past 240 hour(s)).   Labs: BNP  (last 3 results)  Recent Labs  09/25/16 1435  BNP 42.3   Basic Metabolic Panel:  Recent Labs Lab 09/25/16 1435 09/26/16 1101  NA 143 141  K 3.1* 3.4*  CL 108 104  CO2 26 28  GLUCOSE 103* 106*  BUN 7 8  CREATININE 0.83 0.75  CALCIUM 8.4* 8.9  MG  --  2.2  PHOS  --  3.3   Liver Function Tests:  Recent Labs Lab 09/26/16 1101  AST 27  ALT 15  ALKPHOS 46  BILITOT 2.2*  PROT 6.2*  ALBUMIN 3.2*   No results for input(s): LIPASE, AMYLASE in the last 168 hours. No results for input(s): AMMONIA in the last 168 hours. CBC:  Recent Labs Lab 09/25/16 1435 09/26/16 0444  WBC 6.3 6.8  HGB 13.2 13.9  HCT 42.8 44.3  MCV 81.2 81.3  PLT 203 234   Cardiac Enzymes:  Recent Labs Lab 09/25/16 2349 09/26/16 0444 09/26/16 1101  TROPONINI <0.03 <0.03 <0.03   BNP: Invalid input(s): POCBNP CBG: No results for input(s): GLUCAP in the last 168 hours. D-Dimer No results for input(s): DDIMER in the last 72 hours. Hgb A1c No results for input(s): HGBA1C in the last 72 hours. Lipid Profile No results for input(s): CHOL, HDL, LDLCALC, TRIG, CHOLHDL, LDLDIRECT in the last 72 hours. Thyroid function studies No results for input(s): TSH, T4TOTAL, T3FREE, THYROIDAB in the last 72 hours.  Invalid input(s): FREET3 Anemia work up No results for input(s): VITAMINB12, FOLATE, FERRITIN, TIBC, IRON, RETICCTPCT in the last 72 hours. Urinalysis    Component Value Date/Time   COLORURINE YELLOW 12/13/2014 1133   APPEARANCEUR CLEAR 12/13/2014 1133   LABSPEC 1.017 12/13/2014 1133   PHURINE 7.5 12/13/2014 1133   GLUCOSEU NEGATIVE 12/13/2014 1133   HGBUR NEGATIVE 12/13/2014 1133   BILIRUBINUR n 01/03/2016 1048   KETONESUR NEGATIVE 12/13/2014 1133   PROTEINUR n 01/03/2016 1048   PROTEINUR NEGATIVE 12/13/2014 1133   UROBILINOGEN 1.0 01/03/2016 1048   UROBILINOGEN 1.0 12/13/2014 1133   NITRITE n 01/03/2016 1048   NITRITE NEGATIVE 12/13/2014 1133   LEUKOCYTESUR Negative 01/03/2016  1048   Sepsis Labs Invalid input(s): PROCALCITONIN,  WBC,  LACTICIDVEN Microbiology No results found for this or any previous visit (from the past 240 hour(s)).  Time coordinating discharge: 35 minutes  SIGNED:  Kerney Elbe, DO [facility] 09/26/2016, 4:56 PM Pager [phone]  If 7PM-7AM, please contact night-coverage [email or web address] Password [email or web address]

## 2016-09-26 NOTE — Progress Notes (Signed)
Pt given discharge instructions, prescriptions, and care notes. Pt verbalized understanding AEB no further questions or concerns at this time. IV was discontinued, no redness, pain, or swelling noted at this time. Telemetry discontinued and Centralized Telemetry was notified. Pt left the floor via wheelchair with staff in stable condition. 

## 2016-09-29 ENCOUNTER — Telehealth: Payer: Self-pay

## 2016-09-29 NOTE — Discharge Summary (Signed)
Physician Discharge Summary  Nicole Blackwell BPZ:[id number] DOB: 1935/05/15 DOA: 09/25/2016  PCP: Marletta Lor, MD  Admit date: 09/25/2016 Discharge date: 09/29/2016  Admitted From: Home Disposition:  Home; Patient refused PT and was seen walking in Room just fine by staff  Recommendations for Outpatient Follow-up:  1. Follow up with PCP in 1-2 weeks 2. Follow up with Cardiology as an outpatient: Cardiology Clinic should call to schedule you an appointment 3. Follow up with Neurology as an outpatient; Ambulatory Referral to Neurology Clinic made and they will call to schedule you an appointment 4. Please obtain CMP/CBC, Mag, Phos in one week  Home Health: No Equipment/Devices: None  Discharge Condition: Stable  CODE STATUS: FULL CODE Diet recommendation:   Brief/Interim Summary: Nicole Blackwell  is a 81 y.o. female with a past medical history significant for syncopal episode in 2016 with negative workup presenting with a syncopal episode that happened a few hours prior to presentation with resulting fall, scalp laceration with nausea and vomiting. Patient denies any preceding chest pains palpitations or headaches. Patient lives alone with few hours a day help from her daughter. She was at [facility] talking to someone when she became acutely sick and had a syncopal episode. She was brought in and evaluated and had no definitive cause for her syncope. ECHO was done and showed that the patient had evidence of Right Heart Failure and after discussion with the Cardiologist she will need an outpatient TEE and Cardiology follow up which will be arranged by Cardiology. She was deemed medically stable to D/C home and will follow up with PCP, Cardiology, and Neurology as an outpatient.   Discharge Diagnoses:  Active Problems:   Syncope  Syncope and Collapse, suspect Vasovagal episode given N/V -Head and Nec CT showed No intracranial trauma. Posterior scalp hematoma without skull fracture. Atrophy and   white matter microvascular disease unchanged. No cervical spine fracture.Multilevel disc osteophytic disease. -Cardiac Troponin <0.03 -ECHO showed Normal Systolic Fxn -Orthostatics Done and not Orthostatic -PT consulted but patient refused and was seen walking in room without assistance -Given mild IVF rehydration -Neurochecks done; Aricept D/C'd -Follow up with PCP, Neurology and Cardiology in the outpatient setting   Hypokalemia -Replete prior to D/C -Repeat CMP as an outpatient   Right Heart Overload -Evidenced by ECHO -Discussed with Cardiologist who read ECHO and recommends TEE eventually -Can do as an Outpatient -Cardiology to follow patient in the outpatient Setting and will call to schedule evaluation with the patient  N/V -Improved  Discharge Instructions  Discharge Instructions    Ambulatory referral to Neurology    Complete by:  As directed    An appointment is requested in approximately: 1-2 weeks for Recurrent Syncope follow up   Call MD for:  difficulty breathing, headache or visual disturbances    Complete by:  As directed    Call MD for:  extreme fatigue    Complete by:  As directed    Call MD for:  hives    Complete by:  As directed    Call MD for:  persistant dizziness or light-headedness    Complete by:  As directed    Call MD for:  persistant nausea and vomiting    Complete by:  As directed    Call MD for:  redness, tenderness, or signs of infection (pain, swelling, redness, odor or green/yellow discharge around incision site)    Complete by:  As directed    Call MD for:  severe uncontrolled pain  Complete by:  As directed    Call MD for:  temperature >100.4    Complete by:  As directed    Diet - low sodium heart healthy    Complete by:  As directed    Discharge instructions    Complete by:  As directed    Follow up with PCP and Cardiology as an outpatient. Follow up with Neurology as an outpatient as well. If symptoms change or worsen please  return to the ED for evaluation.   Increase activity slowly    Complete by:  As directed      Allergies as of 09/26/2016      Reactions   Penicillin G Benzathine Swelling   Penicillins Swelling      Medication List    STOP taking these medications   donepezil 5 MG tablet Commonly known as:  ARICEPT     TAKE these medications   acetaminophen 500 MG tablet Commonly known as:  TYLENOL Take 500 mg by mouth every 6 (six) hours as needed for mild pain.   aspirin 81 MG tablet Take 81 mg by mouth at bedtime.   feeding supplement (ENSURE ENLIVE) Liqd Take 237 mLs by mouth 2 (two) times daily between meals.      Follow-up Information    Marletta Lor, MD. Call in 1 week(s).   Specialty:  Internal Medicine Why:  Call to schedule an appointment within 1 week Contact information: [place] [address] [phone]        [facility] Follow up.   Why:  If not called for an appointment within 1 week call [phone] to schedule an appointment.  Contact information: [place] [id number] [phone]       [facility] Follow up.   Why:  Referral has been made; Please follow up with them if they have not called you for an appointment.  Contact information: [place], [place] [address] [phone]         Allergies  Allergen Reactions  . Penicillin G Benzathine Swelling  . Penicillins Swelling   Consultations:  None  Procedures/Studies: Ct Head Wo Contrast  Result Date: 09/25/2016 CLINICAL DATA:  Pt was standing in line at the bank and passed out and hit the back of her head. Pt then rolled over and vomited. Pt only complaining of the back of head hurting. Pt sts she does not remember falling. H/O syncope and collapse EXAM: CT HEAD WITHOUT CONTRAST CT CERVICAL SPINE WITHOUT CONTRAST TECHNIQUE:  Multidetector CT imaging of the head and cervical spine was performed following the standard protocol without intravenous contrast. Multiplanar CT image reconstructions of the cervical spine were also generated. COMPARISON:  Head CT 10/08/2015 FINDINGS: CT HEAD FINDINGS Brain: No intracranial hemorrhage. No parenchymal contusion. No midline shift or mass effect. Basilar cisterns are patent. No skull base fracture. No fluid in the paranasal sinuses or mastoid air cells. Orbits are normal. There are periventricular and subcortical white matter hypodensities. Generalized cortical atrophy. Vascular: No hyperdense vessel or unexpected calcification. Skull: No skull fracture Sinuses/Orbits: No fluid in the paranasal sinuses mastoid air cells are clear. Other: Scalp hematoma posterior to the occipital bone without evidence skull fracture. CT CERVICAL SPINE FINDINGS Alignment: Normal alignment of the cervical vertebral bodies. There is multiple levels of joint space narrowing and endplate osteophytosis. Skull base and vertebrae: Normal craniocervical junction. No loss of bowel vertebral body height  or disc height. Normal facet articulation. No evidence of fracture. Soft tissues and spinal canal: No prevertebral soft tissue swelling. No perispinal or epidural hematoma. Disc levels: Multiple levels of joint space narrowing and osteophytosis. Upper chest: Clear Other: None IMPRESSION: 1. No intracranial trauma. 2. Posterior scalp hematoma without skull fracture. 3. Atrophy and  white matter microvascular disease unchanged. 4. No cervical spine fracture. 5. Multilevel disc osteophytic disease. Electronically Signed   By: Suzy Bouchard M.D.   On: 09/25/2016 16:50   Ct Cervical Spine Wo Contrast  Result Date: 09/25/2016 CLINICAL DATA:  Pt was standing in line at the bank and passed out and hit the back of her head. Pt then rolled over and vomited. Pt only complaining of the back of head hurting. Pt sts she does not remember  falling. H/O syncope and collapse EXAM: CT HEAD WITHOUT CONTRAST CT CERVICAL SPINE WITHOUT CONTRAST TECHNIQUE: Multidetector CT imaging of the head and cervical spine was performed following the standard protocol without intravenous contrast. Multiplanar CT image reconstructions of the cervical spine were also generated. COMPARISON:  Head CT 10/08/2015 FINDINGS: CT HEAD FINDINGS Brain: No intracranial hemorrhage. No parenchymal contusion. No midline shift or mass effect. Basilar cisterns are patent. No skull base fracture. No fluid in the paranasal sinuses or mastoid air cells. Orbits are normal. There are periventricular and subcortical white matter hypodensities. Generalized cortical atrophy. Vascular: No hyperdense vessel or unexpected calcification. Skull: No skull fracture Sinuses/Orbits: No fluid in the paranasal sinuses mastoid air cells are clear. Other: Scalp hematoma posterior to the occipital bone without evidence skull fracture. CT CERVICAL SPINE FINDINGS Alignment: Normal alignment of the cervical vertebral bodies. There is multiple levels of joint space narrowing and endplate osteophytosis. Skull base and vertebrae: Normal craniocervical junction. No loss of bowel vertebral body height or disc height. Normal facet articulation. No evidence of fracture. Soft tissues and spinal canal: No prevertebral soft tissue swelling. No perispinal or epidural hematoma. Disc levels: Multiple levels of joint space narrowing and osteophytosis. Upper chest: Clear Other: None IMPRESSION: 1. No intracranial trauma. 2. Posterior scalp hematoma without skull fracture. 3. Atrophy and  white matter microvascular disease unchanged. 4. No cervical spine fracture. 5. Multilevel disc osteophytic disease. Electronically Signed   By: Suzy Bouchard M.D.   On: 09/25/2016 16:50   ECHOCARDIOGRAM Study Conclusions  - Left ventricle: The cavity size was normal. Systolic function was   normal. Wall motion was normal; there were  no regional wall   motion abnormalities. Doppler parameters are consistent with   abnormal left ventricular relaxation (grade 1 diastolic   dysfunction). - Ventricular septum: The contour showed diastolic flattening.   These changes are consistent with RV volume overload. - Left atrium: The atrium was mildly dilated. - Right ventricle: The cavity size was mildly dilated. Wall   thickness was normal. - Right atrium: The atrium was moderately dilated. - Pulmonary arteries: Systolic pressure was mildly increased. PA   peak pressure: 35 mm Hg (S).  Impressions:  - There is evidence for right heart volume overload, without   significant pulmonic or tricuspid valve insufficiency. There is   no clear evidence for atrial septal defect. These are chronic   abnormalities. Consider evaluation for occult left to right shunt   such as partial anomalous pulmonary vein return of ignored ASD.  Subjective: Seen and examined and felt good. No N/V or lightheadedness or dizziness. No CP or SOB and ready to go home.   Discharge Exam: Vitals:  09/26/16 1550 09/26/16 1551  BP: (!) 150/62 140/67  Pulse: 73 75  Resp: 18 18  Temp:     Vitals:   09/26/16 0520 09/26/16 1547 09/26/16 1550 09/26/16 1551  BP: (!) 141/66 (!) 146/78 (!) 150/62 140/67  Pulse: 64 75 73 75  Resp: 17 18 18 18   Temp: 98.6 F (37 C) 98 F (36.7 C)    TempSrc: Oral Oral    SpO2: 100% 100% 100% 98%  Weight:      Height:       General: Pt is alert, awake, not in acute distress Cardiovascular: RRR, S1/S2 +, no rubs, no gallops Respiratory: CTA bilaterally, no wheezing, no rhonchi Abdominal: Soft, NT, ND, bowel sounds + Extremities: no edema, no cyanosis  The results of significant diagnostics from this hospitalization (including imaging, microbiology, ancillary and laboratory) are listed below for reference.    Microbiology: No results found for this or any previous visit (from the past 240 hour(s)).   Labs: BNP  (last 3 results)  Recent Labs  09/25/16 1435  BNP 55.7   Basic Metabolic Panel:  Recent Labs Lab 09/25/16 1435 09/26/16 1101  NA 143 141  K 3.1* 3.4*  CL 108 104  CO2 26 28  GLUCOSE 103* 106*  BUN 7 8  CREATININE 0.83 0.75  CALCIUM 8.4* 8.9  MG  --  2.2  PHOS  --  3.3   Liver Function Tests:  Recent Labs Lab 09/26/16 1101  AST 27  ALT 15  ALKPHOS 46  BILITOT 2.2*  PROT 6.2*  ALBUMIN 3.2*   No results for input(s): LIPASE, AMYLASE in the last 168 hours. No results for input(s): AMMONIA in the last 168 hours. CBC:  Recent Labs Lab 09/25/16 1435 09/26/16 0444  WBC 6.3 6.8  HGB 13.2 13.9  HCT 42.8 44.3  MCV 81.2 81.3  PLT 203 234   Cardiac Enzymes:  Recent Labs Lab 09/25/16 2349 09/26/16 0444 09/26/16 1101  TROPONINI <0.03 <0.03 <0.03   BNP: Invalid input(s): POCBNP CBG: No results for input(s): GLUCAP in the last 168 hours. D-Dimer No results for input(s): DDIMER in the last 72 hours. Hgb A1c No results for input(s): HGBA1C in the last 72 hours. Lipid Profile No results for input(s): CHOL, HDL, LDLCALC, TRIG, CHOLHDL, LDLDIRECT in the last 72 hours. Thyroid function studies No results for input(s): TSH, T4TOTAL, T3FREE, THYROIDAB in the last 72 hours.  Invalid input(s): FREET3 Anemia work up No results for input(s): VITAMINB12, FOLATE, FERRITIN, TIBC, IRON, RETICCTPCT in the last 72 hours. Urinalysis    Component Value Date/Time   COLORURINE YELLOW 12/13/2014 1133   APPEARANCEUR CLEAR 12/13/2014 1133   LABSPEC 1.017 12/13/2014 1133   PHURINE 7.5 12/13/2014 1133   GLUCOSEU NEGATIVE 12/13/2014 1133   HGBUR NEGATIVE 12/13/2014 1133   BILIRUBINUR n 01/03/2016 1048   KETONESUR NEGATIVE 12/13/2014 1133   PROTEINUR n 01/03/2016 1048   PROTEINUR NEGATIVE 12/13/2014 1133   UROBILINOGEN 1.0 01/03/2016 1048   UROBILINOGEN 1.0 12/13/2014 1133   NITRITE n 01/03/2016 1048   NITRITE NEGATIVE 12/13/2014 1133   LEUKOCYTESUR Negative 01/03/2016  1048   Sepsis Labs Invalid input(s): PROCALCITONIN,  WBC,  LACTICIDVEN Microbiology No results found for this or any previous visit (from the past 240 hour(s)).  Time coordinating discharge: 35 minutes  SIGNED:  Kerney Elbe, DO [facility] 09/29/2016, 8:22 PM Pager [phone]  If 7PM-7AM, please contact night-coverage [email or web address] Password [email or web address]

## 2016-09-29 NOTE — Telephone Encounter (Signed)
LMTCB

## 2016-09-30 NOTE — Telephone Encounter (Signed)
D/C 09/26/16 To: home  Spoke with pt and she states that she has been doing very well. She does have an area of "soreness" over her left ribs that she states is from where the nurse "pushed too hard". She denies any true pain, just some mild discomfort. She states that she is independent in all ADL's. She has no questions or concerns at this time.   Appt scheduled with Dr. Raliegh Ip 10/05/16, pt aware.   Transition Care Management Follow-up Telephone Call  How have you been since you were released from the hospital? Good   Do you understand why you were in the hospital? yes   Do you understand the discharge instrcutions? yes  Items Reviewed:  Medications reviewed: yes  Allergies reviewed: yes  Dietary changes reviewed: yes  Referrals reviewed: yes   Functional Questionnaire:   Activities of Daily Living (ADLs):   She states they are independent in the following: ambulation, bathing and hygiene, feeding, continence, grooming, toileting and dressing States they require assistance with the following: none   Any transportation issues/concerns?: no   Any patient concerns? no   Confirmed importance and date/time of follow-up visits scheduled: yes   Confirmed with patient if condition begins to worsen call PCP or go to the ER.  Patient was given the Call-a-Nurse line [phone]: yes

## 2016-10-05 ENCOUNTER — Encounter: Payer: Self-pay | Admitting: Internal Medicine

## 2016-10-05 ENCOUNTER — Ambulatory Visit (INDEPENDENT_AMBULATORY_CARE_PROVIDER_SITE_OTHER): Payer: Medicare Other | Admitting: Internal Medicine

## 2016-10-05 VITALS — BP 132/70 | HR 63 | Temp 97.5°F | Ht 69.0 in | Wt 152.4 lb

## 2016-10-05 DIAGNOSIS — I5030 Unspecified diastolic (congestive) heart failure: Secondary | ICD-10-CM

## 2016-10-05 DIAGNOSIS — E876 Hypokalemia: Secondary | ICD-10-CM | POA: Diagnosis not present

## 2016-10-05 DIAGNOSIS — I50810 Right heart failure, unspecified: Secondary | ICD-10-CM | POA: Diagnosis not present

## 2016-10-05 DIAGNOSIS — R55 Syncope and collapse: Secondary | ICD-10-CM | POA: Diagnosis not present

## 2016-10-05 LAB — BASIC METABOLIC PANEL
BUN: 13 mg/dL (ref 6–23)
CHLORIDE: 102 meq/L (ref 96–112)
CO2: 30 meq/L (ref 19–32)
Calcium: 9.4 mg/dL (ref 8.4–10.5)
Creatinine, Ser: 0.76 mg/dL (ref 0.40–1.20)
GFR: 94.36 mL/min (ref >60.00)
GLUCOSE: 114 mg/dL — AB (ref 70–99)
Potassium: 3.7 mEq/L (ref 3.5–5.1)
SODIUM: 140 meq/L (ref 135–145)

## 2016-10-05 NOTE — Patient Instructions (Signed)
Limit your sodium (Salt) intake   cardiology follow-up as scheduled

## 2016-10-05 NOTE — Progress Notes (Signed)
Subjective:    Patient ID: Nicole Blackwell, female    DOB: [birth year], 81 y.o.   MRN: [id number]  HPI  81 year old patient who is seen today following a hospital discharge and for transitional care management  Admit date: 09/25/2016 Discharge date: 09/29/2016  Admitted From: Home Disposition:  Home; Patient refused PT and was seen walking in Room just fine by staff  Recommendations for Outpatient Follow-up:  1. Follow up with PCP in 1-2 weeks 2. Follow up with Cardiology as an outpatient: Cardiology Clinic should call to schedule you an appointment 3. Follow up with Neurology as an outpatient; Ambulatory Referral to Neurology Clinic made and they will call to schedule you an appointment 4. Please obtain CMP/CBC, Mag, Phos in one week  Patient was admitted for evaluation of a syncopal episode that was felt to be vasovagal.  Prior to the episode she had some nausea.  Evaluation included a echocardiogram that revealed normal systolic function but had evidence of right heart overload.  Cardiology recommended a outpatient TEE.  Apparently, d-dimer or chest CTA not performed.  Patient denies any exertional symptoms, shortness of breath or chest pain.  She generally feels quite well  Discharge medications include aspirin only  Past Medical History:  Diagnosis Date  . BACK PAIN   . OSTEOPOROSIS   . PLANTAR FASCIITIS   . Positive PPD   . SHINGLES   . SYMPTOMATIC MENOPAUSAL/FEMALE CLIMACTERIC STATES   . Syncope and collapse 12/13/2014  . Uterine cancer [facility])      Social History   Social History  . Marital status: Divorced    Spouse name: N/A  . Number of children: N/A  . Years of education: N/A   Occupational History  . Not on file.   Social History Main Topics  . Smoking status: Never Smoker  . Smokeless tobacco: Never Used  . Alcohol use No  . Drug use: No  . Sexual activity: No   Other Topics Concern  . Not on file   Social History Narrative  . No narrative on file     Past Surgical History:  Procedure Laterality Date  . CATARACT EXTRACTION, BILATERAL Bilateral   . DILATION AND CURETTAGE OF UTERUS  1960's X 2   "I lost 2 children to miscarriage"  . LAPAROSCOPIC CHOLECYSTECTOMY    . SIGMOIDOSCOPY    . TONSILLECTOMY    . VAGINAL HYSTERECTOMY      No family history on file.  Allergies  Allergen Reactions  . Penicillin G Benzathine Swelling  . Penicillins Swelling    Current Outpatient Prescriptions on File Prior to Visit  Medication Sig Dispense Refill  . aspirin 81 MG tablet Take 81 mg by mouth at bedtime.      No current facility-administered medications on file prior to visit.     BP 132/70 (BP Location: Left Arm, Patient Position: Sitting, Cuff Size: Normal)   Pulse 63   Temp (!) 97.5 F (36.4 C) (Oral)   Ht 5\' 9"  (1.753 m)   Wt 152 lb 6.4 oz (69.1 kg)   SpO2 96%   BMI 22.51 kg/m     Review of Systems  Constitutional: Negative.   HENT: Negative for congestion, dental problem, hearing loss, rhinorrhea, sinus pressure, sore throat and tinnitus.   Eyes: Negative for pain, discharge and visual disturbance.  Respiratory: Negative for cough and shortness of breath.   Cardiovascular: Negative for chest pain, palpitations and leg swelling.  Gastrointestinal: Negative for abdominal distention, abdominal pain, blood  in stool, constipation, diarrhea, nausea and vomiting.  Genitourinary: Negative for difficulty urinating, dysuria, flank pain, frequency, hematuria, pelvic pain, urgency, vaginal bleeding, vaginal discharge and vaginal pain.  Musculoskeletal: Negative for arthralgias, gait problem and joint swelling.  Skin: Negative for rash.  Neurological: Negative for dizziness, syncope, speech difficulty, weakness, numbness and headaches.  Hematological: Negative for adenopathy.  Psychiatric/Behavioral: Negative for agitation, behavioral problems and dysphoric mood. The patient is not nervous/anxious.        Objective:   Physical  Exam  Constitutional: She is oriented to person, place, and time. She appears well-developed and well-nourished.  Blood pressure 130/70 Pulse mid sixties O2 saturation 96%  HENT:  Head: Normocephalic.  Right Ear: External ear normal.  Left Ear: External ear normal.  Mouth/Throat: Oropharynx is clear and moist.  Eyes: Pupils are equal, round, and reactive to light. Conjunctivae and EOM are normal.  Neck: Normal range of motion. Neck supple. No thyromegaly present.  Cardiovascular: Normal rate, regular rhythm, normal heart sounds and intact distal pulses.   Pulmonary/Chest: Effort normal and breath sounds normal.  Abdominal: Soft. Bowel sounds are normal. She exhibits no mass. There is no tenderness.  Musculoskeletal: Normal range of motion.  Lymphadenopathy:    She has no cervical adenopathy.  Neurological: She is alert and oriented to person, place, and time.  Skin: Skin is warm and dry. No rash noted.  Psychiatric: She has a normal mood and affect. Her behavior is normal.          Assessment & Plan:   History of syncope.  Cardiology follow-up as scheduled.  Will check d-dimer History of RV overload.  Will check d-dimer.  Consider chest CTA.  If positive History mild cognitive dysfunction secondary to microvascular disease.  Will observe off therapy.  Continue daily aspirin  Return in 2 months for follow-up  Nyoka Cowden

## 2016-10-06 ENCOUNTER — Ambulatory Visit (INDEPENDENT_AMBULATORY_CARE_PROVIDER_SITE_OTHER)
Admission: RE | Admit: 2016-10-06 | Discharge: 2016-10-06 | Disposition: A | Discharge status: Home / Self Care | Payer: Medicare Other | Source: Ambulatory Visit | Attending: Internal Medicine | Admitting: Internal Medicine

## 2016-10-06 ENCOUNTER — Other Ambulatory Visit: Payer: Self-pay | Admitting: Internal Medicine

## 2016-10-06 DIAGNOSIS — R079 Chest pain, unspecified: Secondary | ICD-10-CM

## 2016-10-06 LAB — D-DIMER, QUANTITATIVE (NOT AT ARMC): D DIMER QUANT: 1.37 ug{FEU}/mL — AB (ref <0.50)

## 2016-10-06 IMAGING — CT CT ANGIO CHEST
2 of 9 series · 19 of 46 positions shown · IV contrast (isovue)
Comparison: Chest x-ray [DATE]

CLINICAL DATA: Chest pain.

EXAM:
CT ANGIOGRAPHY CHEST WITH CONTRAST
TECHNIQUE: Multidetector CT imaging of the chest was performed using the
standard protocol during bolus administration of intravenous
contrast. Multiplanar CT image reconstructions and MIPs were
obtained to evaluate the vascular anatomy.
CONTRAST:  80 cc Isovue 370 intravenous

[Series 6: thins · axial · 0.71mm/px · z∈[+988,+1224]mm · 16 of 267 slices shown]
[im 15/267  lung]
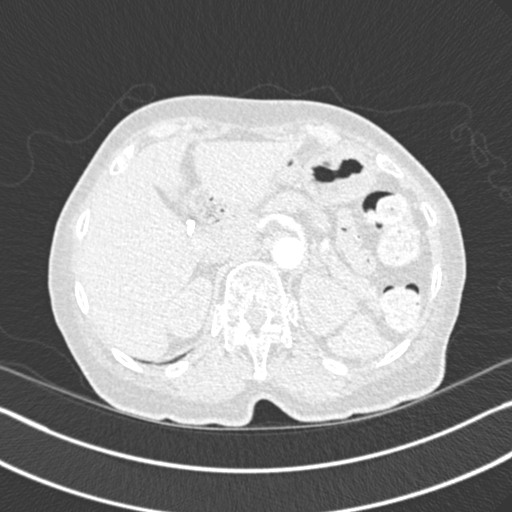
[im 30/267  soft-tissue]
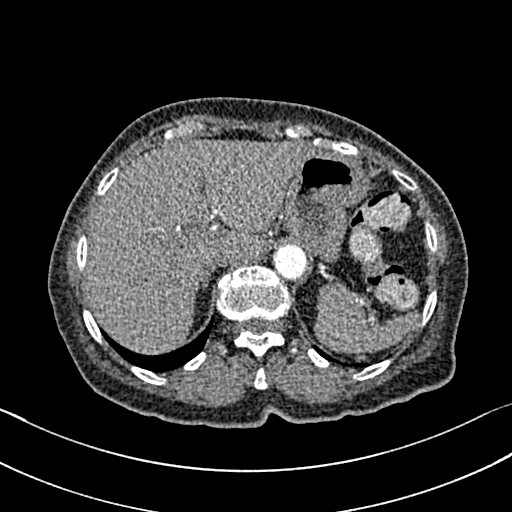
[im 45/267  lung]
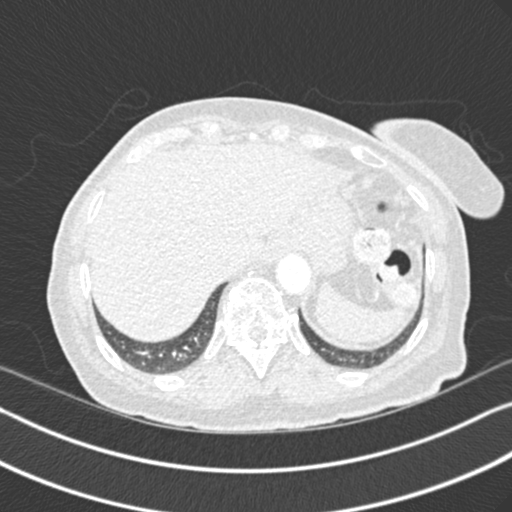
[im 60/267  soft-tissue]
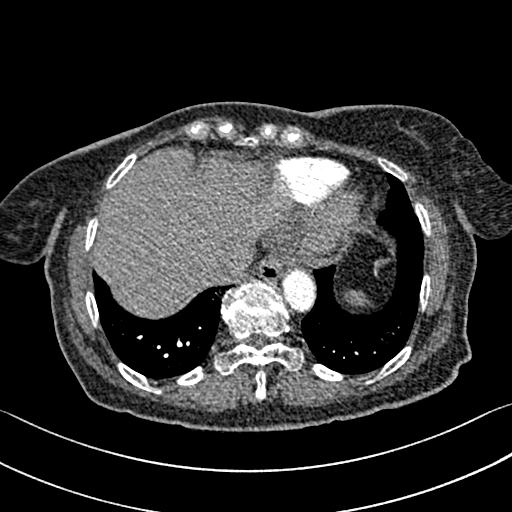
[im 74/267  lung]
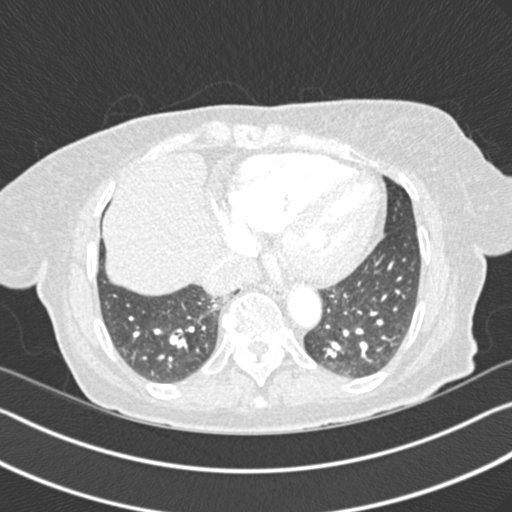
[im 89/267  soft-tissue]
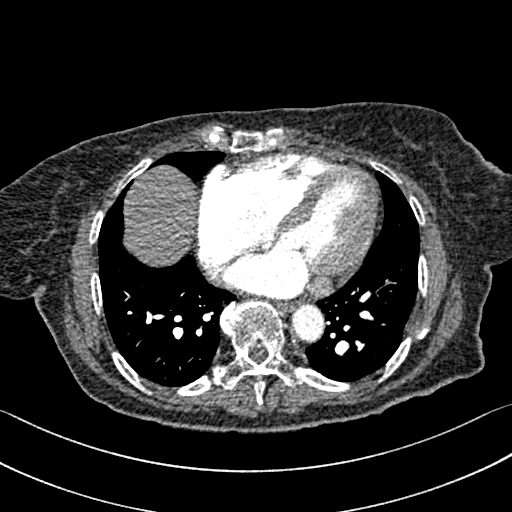
[im 104/267  lung]
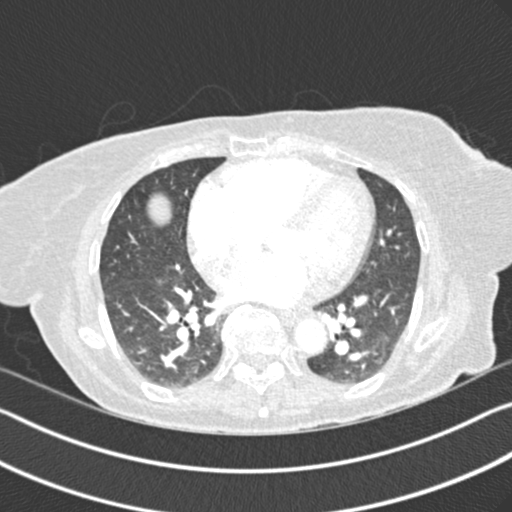
[im 119/267  soft-tissue]
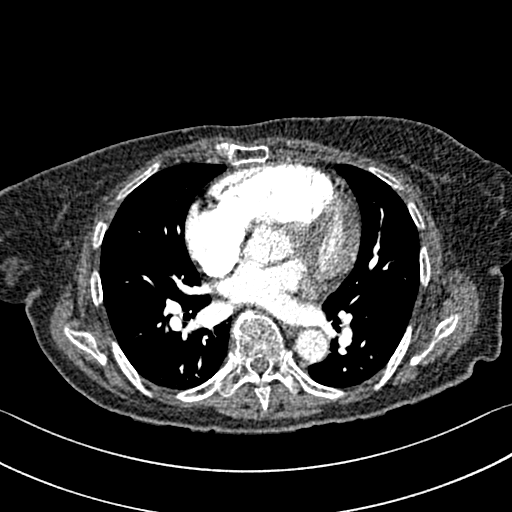
[im 148/267  lung]
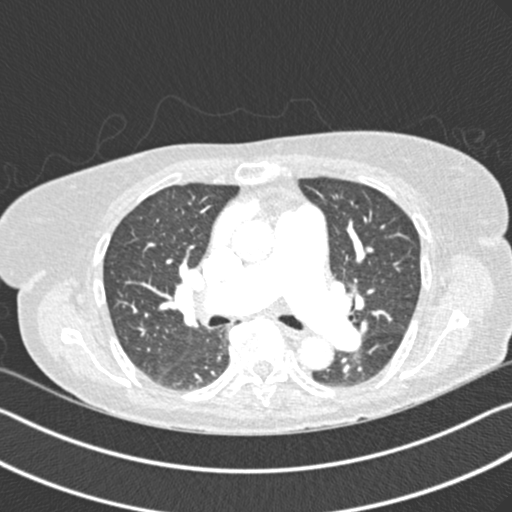
[im 163/267  soft-tissue]
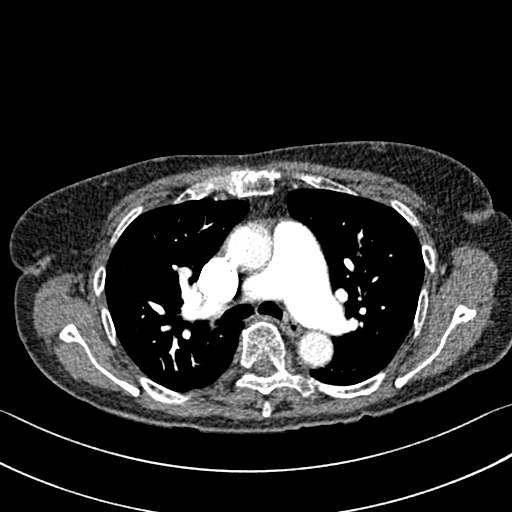
[im 178/267  lung]
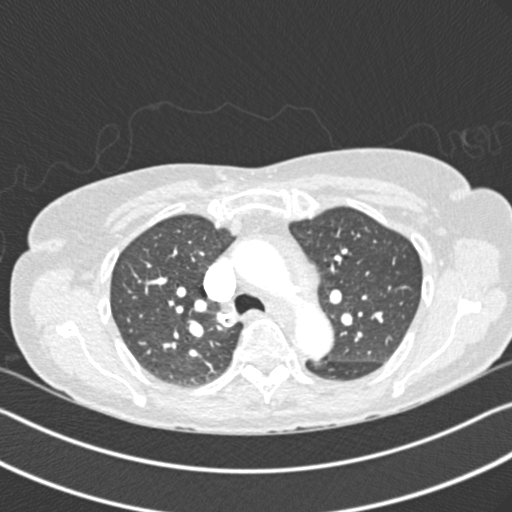
[im 193/267  soft-tissue]
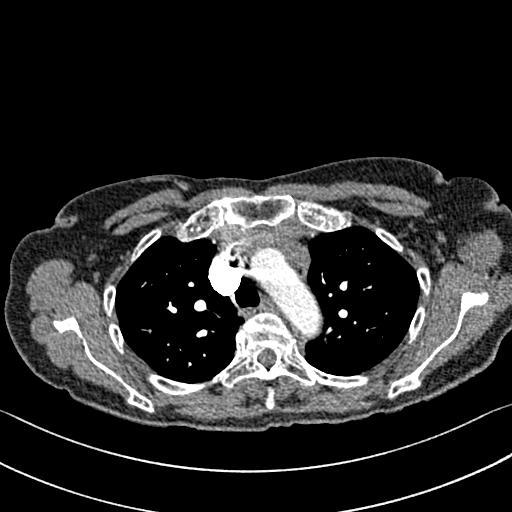
[im 207/267  lung]
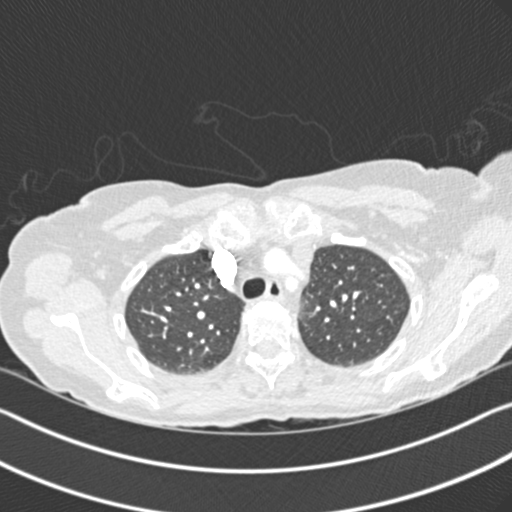
[im 222/267  soft-tissue]
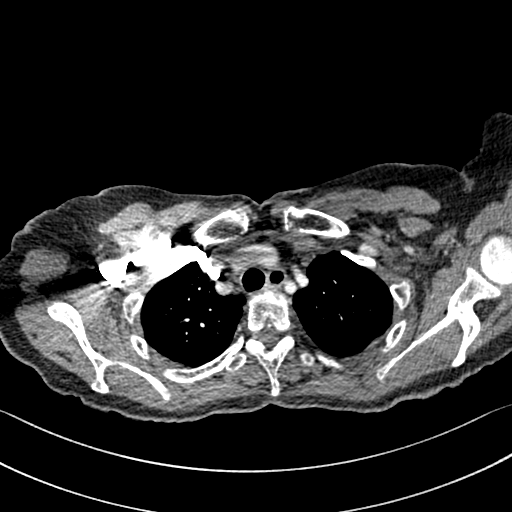
[im 237/267  lung]
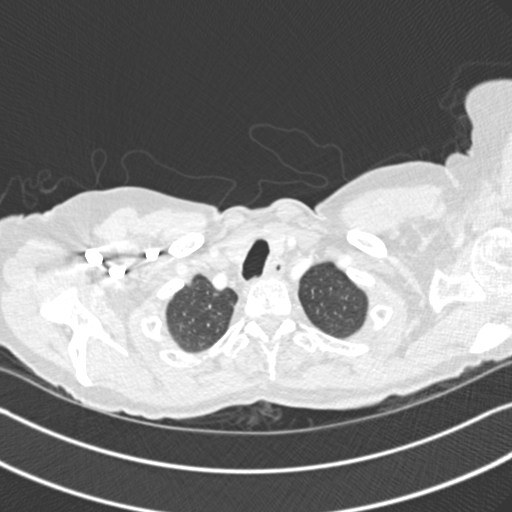
[im 252/267  soft-tissue]
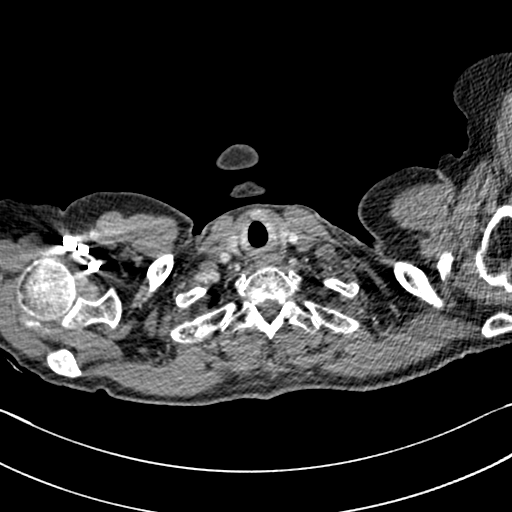

[Series 8: coronal mpr · coronal · 0.59mm/px · 3 of 104 slices shown]
[im 26/104  soft-tissue]
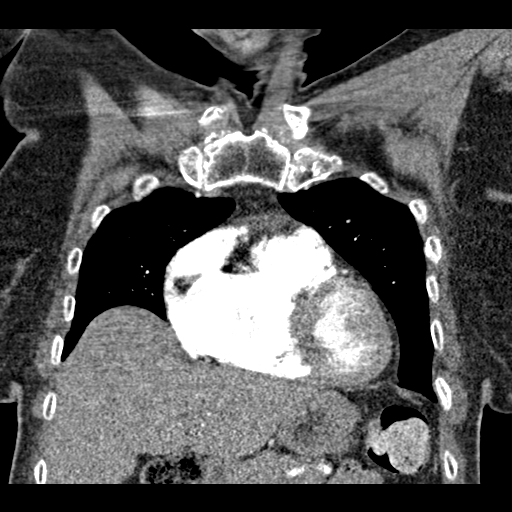
[im 52/104  soft-tissue]
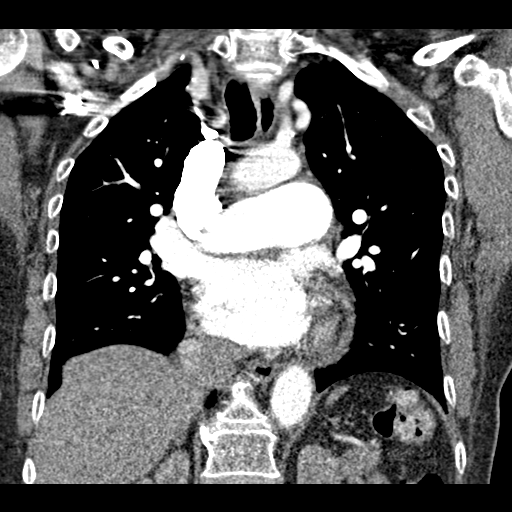
[im 78/104  soft-tissue]
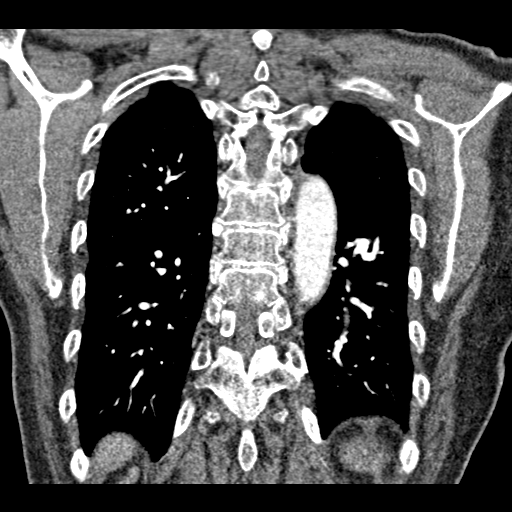

[19 of 46 positions shown; findings below may reference images not displayed]

FINDINGS: Cardiovascular: Satisfactory opacification of the pulmonary arteries
to the segmental level. No evidence of pulmonary embolism.
Cardiomegaly with prominent right heart size. Enlarged main
pulmonary artery measuring 38 mm in diameter. Mild atherosclerotic
calcification of the aorta.

Mediastinum/Nodes: Negative for mass or adenopathy.

Lungs/Pleura: There is no edema, consolidation, effusion, or
pneumothorax.

Upper Abdomen: Cholecystectomy.

Musculoskeletal: No acute or aggressive finding.

Review of the MIP images confirms the above findings.
IMPRESSION: 1. Negative for pulmonary embolism.  No acute finding.
2. Cardiomegaly. Enlarged main pulmonary artery suggesting pulmonary
hypertension.
3. Aortic Atherosclerosis ([ZH]-[ZH]), mild.

## 2016-10-06 MED ORDER — IOPAMIDOL (ISOVUE-370) INJECTION 76%
100.0000 mL | Freq: Once | INTRAVENOUS | Status: AC | PRN
  Administered 2016-10-06: 80 mL via INTRAVENOUS

## 2017-12-15 ENCOUNTER — Encounter: Payer: Self-pay | Admitting: Family Medicine

## 2017-12-15 ENCOUNTER — Telehealth: Payer: Self-pay

## 2017-12-15 ENCOUNTER — Ambulatory Visit (INDEPENDENT_AMBULATORY_CARE_PROVIDER_SITE_OTHER): Payer: Medicare HMO | Admitting: Family Medicine

## 2017-12-15 VITALS — BP 138/78 | HR 74 | Temp 98.1°F | Wt 161.0 lb

## 2017-12-15 DIAGNOSIS — Z8679 Personal history of other diseases of the circulatory system: Secondary | ICD-10-CM

## 2017-12-15 DIAGNOSIS — R413 Other amnesia: Secondary | ICD-10-CM

## 2017-12-15 LAB — CBC
HCT: 45.1 % (ref 36.0–46.0)
HEMOGLOBIN: 14.6 g/dL (ref 12.0–15.0)
MCHC: 32.3 g/dL (ref 30.0–36.0)
MCV: 80.6 fl (ref 78.0–100.0)
Platelets: 222 10*3/uL (ref 150.0–400.0)
RBC: 5.59 Mil/uL — AB (ref 3.87–5.11)
RDW: 15.4 % (ref 11.5–15.5)
WBC: 6.1 10*3/uL (ref 4.0–10.5)

## 2017-12-15 LAB — BASIC METABOLIC PANEL
BUN: 17 mg/dL (ref 6–23)
CHLORIDE: 100 meq/L (ref 96–112)
CO2: 29 mEq/L (ref 19–32)
CREATININE: 0.8 mg/dL (ref 0.40–1.20)
Calcium: 9 mg/dL (ref 8.4–10.5)
GFR: 88.67 mL/min (ref >60.00)
GLUCOSE: 116 mg/dL — AB (ref 70–99)
Potassium: 4.2 mEq/L (ref 3.5–5.1)
SODIUM: 138 meq/L (ref 135–145)

## 2017-12-15 LAB — T4, FREE: FREE T4: 0.76 ng/dL (ref 0.60–1.60)

## 2017-12-15 LAB — TSH: TSH: 1.6 u[IU]/mL (ref 0.35–4.50)

## 2017-12-15 LAB — BRAIN NATRIURETIC PEPTIDE: PRO B NATRI PEPTIDE: 52 pg/mL (ref 0.0–100.0)

## 2017-12-15 LAB — VITAMIN B12: Vitamin B-12: 200 pg/mL — ABNORMAL LOW (ref 211–911)

## 2017-12-15 NOTE — Telephone Encounter (Signed)
Copied from [place] [phone]. Topic: Quick Communication - Rx Refill/Question >> Dec 15, 2017  1:04 PM Scherrie Gerlach wrote: Medication:  a memory med  Daughter denise calling to ask if Dr banks will prescribe a memory med for the pt, as she thought after todays's visit she was going to put the pt back on one. [facility] [address]

## 2017-12-15 NOTE — Progress Notes (Signed)
Subjective:    Patient ID: Nicole Blackwell, female    DOB: [birth year], 82 y.o.   MRN: [id number]  No chief complaint on file. Patient accompanied by her daughter.  HPI Patient was seen today for f/u on chronic conditions an TOC, previously seen by Dr. Inda Merlin.  Memory concerns: -In the past patient was on Aricept.  Pt's daughter was unaware of this. -Per patient's daughter she is now having to make sure patient's bills are paid on time. -CT head without contrast 10/08/2015 with atrophy and microvascular disease.  CT head 09/25/2016 with similar results. -Pt and family deny h/o CVA.  Did note several episodes of syncope few years ago.   History of heart failure: -Per pt's daughter she was unaware of this. -Pt's daughter had concerns about pt having edema in lower extremities. -Pt denies increased sodium intake, SOB, dyspnea.   Past Medical History:  Diagnosis Date  . BACK PAIN   . OSTEOPOROSIS   . PLANTAR FASCIITIS   . Positive PPD   . SHINGLES   . SYMPTOMATIC MENOPAUSAL/FEMALE CLIMACTERIC STATES   . Syncope and collapse 12/13/2014  . Uterine cancer (HCC)     Allergies  Allergen Reactions  . Penicillin G Benzathine Swelling  . Penicillins Swelling    ROS General: Denies fever, chills, night sweats, changes in weight, changes in appetite  +memory deficit HEENT: Denies headaches, ear pain, changes in vision, rhinorrhea, sore throat CV: Denies CP, palpitations, SOB, orthopnea Pulm: Denies SOB, cough, wheezing GI: Denies abdominal pain, nausea, vomiting, diarrhea, constipation GU: Denies dysuria, hematuria, frequency, vaginal discharge Msk: Denies muscle cramps, joint pains  +LE edema Neuro: Denies weakness, numbness, tingling Skin: Denies rashes, bruising Psych: Denies depression, anxiety, hallucinations     Objective:    Blood pressure 138/78, pulse 74, temperature 98.1 F (36.7 C), temperature source Oral, weight 161 lb (73 kg), SpO2 97 %.   Gen. Pleasant,  well-nourished, in no distress, normal affect   HEENT: [place]/AT, face symmetric,  no scleral icterus, PERRLA, nares patent without drainage Lungs: no accessory muscle use, CTAB, no wheezes or rales Cardiovascular: RRR, no m/r/g, no peripheral edema Neuro:  A&Ox2, CN II-XII intact, slowed gait.  Able to recall 3 words, name city, county, and month.  Could name 4 words starting with the letter "F".  Pt stated Tawni Pummel was the president. Skin:  Warm, no lesions/ rash   Wt Readings from Last 3 Encounters:  12/15/17 161 lb (73 kg)  10/05/16 152 lb 6.4 oz (69.1 kg)  09/25/16 150 lb 8 oz (68.3 kg)    Lab Results  Component Value Date   WBC 6.1 12/15/2017   HGB 14.6 12/15/2017   HCT 45.1 12/15/2017   PLT 222.0 12/15/2017   GLUCOSE 116 (H) 12/15/2017   CHOL 227 (H) 12/18/2015   TRIG 84.0 12/18/2015   HDL 60.00 12/18/2015   LDLDIRECT 154.0 08/31/2011   LDLCALC 150 (H) 12/18/2015   ALT 15 09/26/2016   AST 27 09/26/2016   NA 138 12/15/2017   K 4.2 12/15/2017   CL 100 12/15/2017   CREATININE 0.80 12/15/2017   BUN 17 12/15/2017   CO2 29 12/15/2017   TSH 1.60 12/15/2017    Assessment/Plan:  Memory deficit  -likely with vascular dementia given atrophy with microvascular disease on CT head without contrast on 10/08/2015 and 09/25/2016 -will obtain labs, then discuss restarting Aricept.   -Unable to fully complete mini-mental exam as pt was trying to get help from her daughter. - Plan: CBC (no  diff), TSH, T4, Free, Vitamin B12 -Discussed neuropsych testing  History of heart failure -Discussed limiting sodium intake, monitoring weight gain -Patient given handout -We will obtain labs and adjust medications if needed - Plan: CBC (no diff), Basic metabolic panel, Brain Natriuretic Peptide  Follow-up in 1 month  More than 50% of over 25 minutes spent in total in caring for this patient was spent face-to-face with the patient, counseling and/or coordinating care.    Grier Mitts, MD

## 2017-12-15 NOTE — Telephone Encounter (Signed)
Pt had some lab work done today. Dr Volanda Napoleon is waiting on pt lab results before any recommendation

## 2017-12-15 NOTE — Patient Instructions (Signed)
Heart Failure Heart failure means your heart has trouble pumping blood. This makes it hard for your body to work well. Heart failure is usually a long-term (chronic) condition. You must take good care of yourself and follow your doctor's treatment plan. Follow these instructions at home:  Take your heart medicine as told by your doctor. ? Do not stop taking medicine unless your doctor tells you to. ? Do not skip any dose of medicine. ? Refill your medicines before they run out. ? Take other medicines only as told by your doctor or pharmacist.  Stay active if told by your doctor. The elderly and people with severe heart failure should talk with a doctor about physical activity.  Eat heart-healthy foods. Choose foods that are without trans fat and are low in saturated fat, cholesterol, and salt (sodium). This includes fresh or frozen fruits and vegetables, fish, lean meats, fat-free or low-fat dairy foods, whole grains, and high-fiber foods. Lentils and dried peas and beans (legumes) are also good choices.  Limit salt if told by your doctor.  Cook in a healthy way. Roast, grill, broil, bake, poach, steam, or stir-fry foods.  Limit fluids as told by your doctor.  Weigh yourself every morning. Do this after you pee (urinate) and before you eat breakfast. Write down your weight to give to your doctor.  Take your blood pressure and write it down if your doctor tells you to.  Ask your doctor how to check your pulse. Check your pulse as told.  Lose weight if told by your doctor.  Stop smoking or chewing tobacco. Do not use gum or patches that help you quit without your doctor's approval.  Schedule and go to doctor visits as told.  Nonpregnant women should have no more than 1 drink a day. Men should have no more than 2 drinks a day. Talk to your doctor about drinking alcohol.  Stop illegal drug use.  Stay current with shots (immunizations).  Manage your health conditions as told by your  doctor.  Learn to manage your stress.  Rest when you are tired.  If it is really hot outside: ? Avoid intense activities. ? Use air conditioning or fans, or get in a cooler place. ? Avoid caffeine and alcohol. ? Wear loose-fitting, lightweight, and light-colored clothing.  If it is really cold outside: ? Avoid intense activities. ? Layer your clothing. ? Wear mittens or gloves, a hat, and a scarf when going outside. ? Avoid alcohol.  Learn about heart failure and get support as needed.  Get help to maintain or improve your quality of life and your ability to care for yourself as needed. Contact a doctor if:  You gain weight quickly.  You are more short of breath than usual.  You cannot do your normal activities.  You tire easily.  You cough more than normal, especially with activity.  You have any or more puffiness (swelling) in areas such as your hands, feet, ankles, or belly (abdomen).  You cannot sleep because it is hard to breathe.  You feel like your heart is beating fast (palpitations).  You get dizzy or light-headed when you stand up. Get help right away if:  You have trouble breathing.  There is a change in mental status, such as becoming less alert or not being able to focus.  You have chest pain or discomfort.  You faint. This information is not intended to replace advice given to you by your health care provider. Make sure you  discuss any questions you have with your health care provider. Document Released: 11/19/2007 Document Revised: 07/18/2015 Document Reviewed: 03/28/2012 Elsevier Interactive Patient Education  2017 [employer]  Dementia Dementia is the loss of two or more brain functions, such as:  Memory.  Decision making.  Behavior.  Speaking.  Thinking.  Problem solving.  There are many types of dementia. The most common type is called progressive dementia. Progressive dementia gets worse with time and it is irreversible. An  example of this type of dementia is Alzheimer disease. What are the causes? This condition may be caused by:  Nerve cell damage in the brain.  Genetic mutations.  Certain medicines.  Multiple small strokes.  An infection, such as chronic meningitis.  A metabolic problem, such as vitamin B12 deficiency or thyroid disease.  Pressure on the brain, such as from a tumor or blood clot.  What are the signs or symptoms? Symptoms of this condition include:  Sudden changes in mood.  Depression.  Problems with balance.  Changes in personality.  Poor short-term memory.  Agitation.  Delusions.  Hallucinations.  Having a hard time: ? Speaking thoughts. ? Finding words. ? Solving problems. ? Doing familiar tasks. ? Understanding familiar ideas.  How is this diagnosed? This condition is diagnosed with an assessment by your health care provider. During this assessment, your health care provider will talk with you and your family, friends, or caregivers about your symptoms. A thorough medical history will be taken, and you will have a physical exam and tests. Tests may include:  Lab tests, such as blood or urine tests.  Imaging tests, such as a CT scan, PET scan, or MRI.  A lumbar puncture. This test involves removing and testing a small amount of the fluid that surrounds the brain and spinal cord.  An electroencephalogram (EEG). In this test, small metal discs are used to measure electrical activity in the brain.  Memory tests, cognitive tests, and neuropsychological tests. These tests evaluate brain function.  How is this treated? Treatment depends on the cause of the dementia. It may involve taking medicines that may help:  To control the dementia.  To slow down the disease.  To manage symptoms.  In some cases, treating the cause of the dementia can improve symptoms, reverse symptoms, or slow down how quickly the dementia gets worse. Your health care provider can  help direct you to support groups, organizations, and other health care providers who can help with decisions about your care. Follow these instructions at home: Medicine  Take over-the-counter and prescription medicines only as told by your health care provider.  Avoid taking medicines that can affect thinking, such as pain or sleeping medicines. Lifestyle   Make healthy lifestyle choices: ? Be physically active as told by your health care provider. ? Do not use any tobacco products, such as cigarettes, chewing tobacco, and e-cigarettes. If you need help quitting, ask your health care provider. ? Eat a healthy diet. ? Practice stress-management techniques when you get stressed. ? Stay social.  Drink enough fluid to keep your urine clear or pale yellow.  Make sure to get quality sleep. These tips can help you to get a good night's rest: ? Avoid napping during the day. ? Keep your sleeping area dark and cool. ? Avoid exercising during the few hours before you go to bed. ? Avoid caffeine products in the evening. General instructions  Work with your health care provider to determine what you need help with and what  your safety needs are.  If you were given a bracelet that tracks your location, make sure to wear it.  Keep all follow-up visits as told by your health care provider. This is important. Contact a health care provider if:  You have any new symptoms.  You have problems with choking or swallowing.  You have any symptoms of a different illness. Get help right away if:  You develop a fever.  You have new or worsening confusion.  You have new or worsening sleepiness.  You have a hard time staying awake.  You or your family members become concerned for your safety. This information is not intended to replace advice given to you by your health care provider. Make sure you discuss any questions you have with your health care provider. Document Released: 08/05/2000  Document Revised: 06/20/2015 Document Reviewed: 11/07/2014 Elsevier Interactive Patient Education  2018 [employer].  Dementia Caregiver Guide Dementia is a term used to describe a number of symptoms that affect memory and thinking. The most common symptoms include:  Memory loss.  Trouble with language and communication.  Trouble concentrating.  Poor judgment.  Problems with reasoning.  Child-like behavior and language.  Extreme anxiety.  Angry outbursts.  Wandering from home or public places.  Dementia usually gets worse slowly over time. In the early stages, people with dementia can stay independent and safe with some help. In later stages, they need help with daily tasks such as dressing, grooming, and using the bathroom. How to help the person with dementia cope Dementia can be frightening and confusing. Here are some tips to help the person with dementia cope with changes caused by the disease. General tips  Keep the person on track with his or her routine.  Try to identify areas where the person may need help.  Be supportive, patient, calm, and encouraging.  Gently remind the person that adjusting to changes takes time.  Help with the tasks that the person has asked for help with.  Keep the person involved in daily tasks and decisions as much as possible.  Encourage conversation, but try not to get frustrated or harried if the person struggles to find words or does not seem to appreciate your help. Communication tips  When the person is talking or seems frustrated, make eye contact and hold the person's hand.  Ask specific questions that need yes or no answers.  Use simple words, short sentences, and a calm voice. Only give one direction at a time.  When offering choices, limit them to just 1 or 2.  Avoid correcting the person in a negative way.  If the person is struggling to find the right words, gently try to help him or her. How to recognize symptoms  of stress Symptoms of stress in caregivers include:  Feeling frustrated or angry with the person with dementia.  Denying that the person has dementia or that his or her symptoms will not improve.  Feeling hopeless and unappreciated.  Difficulty sleeping.  Difficulty concentrating.  Feeling anxious, irritable, or depressed.  Developing stress-related health problems.  Feeling like you have too little time for your own life.  Follow these instructions at home:  Make sure that you and the person you are caring for: ? Get regular sleep. ? Exercise regularly. ? Eat regular, nutritious meals. ? Drink enough fluid to keep your urine clear or pale yellow. ? Take over-the-counter and prescription medicines only as told by your health care providers. ? Attend all scheduled health  care appointments.  Join a support group with others who are caregivers.  Ask about respite care resources so that you can have a regular break from the stress of caregiving.  Look for signs of stress in yourself and in the person you are caring for. If you notice signs of stress, take steps to manage it.  Consider any safety risks and take steps to avoid them.  Organize medications in a pill box for each day of the week.  Create a plan to handle any legal or financial matters. Get legal or financial advice if needed.  Keep a calendar in a central location to remind the person of appointments or other activities. Tips for reducing the risk of injury  Keep floors clear of clutter. Remove rugs, magazine racks, and floor lamps.  Keep hallways well lit, especially at night.  Put a handrail and nonslip mat in the bathtub or shower.  Put childproof locks on cabinets that contain dangerous items, such as medicines, alcohol, guns, toxic cleaning items, sharp tools or utensils, matches, and lighters.  Put the locks in places where the person cannot see or reach them easily. This will help ensure that the  person does not wander out of the house and get lost.  Be prepared for emergencies. Keep a list of emergency phone numbers and addresses in a convenient area.  Remove car keys and lock garage doors so that the person does not try to get in the car and drive.  Have the person wear a bracelet that tracks locations and identifies the person as having memory problems. This should be worn at all times for safety. Where to find support: Many individuals and organizations offer support. These include:  Support groups for people with dementia and for caregivers.  Counselors or therapists.  Home health care services.  Adult day care centers.  Where to find more information: Alzheimer's Association: [employer] Contact a health care provider if:  The person's health is rapidly getting worse.  You are no longer able to care for the person.  Caring for the person is affecting your physical and emotional health.  The person threatens himself or herself, you, or anyone else. Summary  Dementia is a term used to describe a number of symptoms that affect memory and thinking.  Dementia usually gets worse slowly over time.  Take steps to reduce the person's risk of injury, and to plan for future care.  Caregivers need support, relief from caregiving, and time for their own lives. This information is not intended to replace advice given to you by your health care provider. Make sure you discuss any questions you have with your health care provider. Document Released: 01/14/2016 Document Revised: 01/14/2016 Document Reviewed: 01/14/2016 Elsevier Interactive Patient Education  [employer].

## 2017-12-17 ENCOUNTER — Other Ambulatory Visit: Payer: Self-pay | Admitting: Family Medicine

## 2017-12-17 DIAGNOSIS — R413 Other amnesia: Secondary | ICD-10-CM

## 2017-12-21 ENCOUNTER — Telehealth: Payer: Self-pay | Admitting: *Deleted

## 2017-12-21 NOTE — Telephone Encounter (Signed)
Left a detailed message on pt daughter [name]) voicemail, advised to call office for instructions regarding the mother Lab results

## 2017-12-21 NOTE — Telephone Encounter (Signed)
[name], daughter called in for her mother's lab results. [name] is out of town this week so she is going to talk with her mother regarding the B12 injections and call us next week to schedule them. I let her know that would be fine.  Made a telephone encounter due to no Result Note made.

## 2018-01-14 NOTE — Telephone Encounter (Signed)
A referral to neurology was placed.

## 2018-01-17 ENCOUNTER — Encounter: Payer: Self-pay | Admitting: Family Medicine

## 2018-01-17 ENCOUNTER — Ambulatory Visit (INDEPENDENT_AMBULATORY_CARE_PROVIDER_SITE_OTHER): Payer: Medicare HMO | Admitting: Family Medicine

## 2018-01-17 VITALS — BP 126/82 | HR 64 | Temp 97.7°F | Wt 153.0 lb

## 2018-01-17 DIAGNOSIS — I5032 Chronic diastolic (congestive) heart failure: Secondary | ICD-10-CM | POA: Diagnosis not present

## 2018-01-17 DIAGNOSIS — L602 Onychogryphosis: Secondary | ICD-10-CM | POA: Diagnosis not present

## 2018-01-17 DIAGNOSIS — F0150 Vascular dementia without behavioral disturbance: Secondary | ICD-10-CM

## 2018-01-17 DIAGNOSIS — R600 Localized edema: Secondary | ICD-10-CM

## 2018-01-17 DIAGNOSIS — E538 Deficiency of other specified B group vitamins: Secondary | ICD-10-CM

## 2018-01-17 DIAGNOSIS — Z23 Encounter for immunization: Secondary | ICD-10-CM

## 2018-01-17 MED ORDER — CYANOCOBALAMIN 1000 MCG/ML IJ SOLN
1000.0000 ug | Freq: Once | INTRAMUSCULAR | Status: AC
  Administered 2018-01-17: 1000 ug via INTRAMUSCULAR

## 2018-01-17 MED ORDER — FUROSEMIDE 20 MG PO TABS: 20.0000 mg | ORAL_TABLET | Freq: Every day | ORAL | 0 refills | Status: DC

## 2018-01-17 NOTE — Patient Instructions (Addendum)
Living With Heart Failure  Heart failure is a long-term (chronic) condition in which the heart cannot pump enough blood through the body. When this happens, parts of the body do not get the blood and oxygen they need. There is no cure for heart failure at this time, so it is important for you to take good care of yourself and follow the treatment plan set by your health care provider. If you are living with heart failure, there are ways to help you manage the disease. Follow these instructions at home: Living with heart failure requires you to make changes in your life. Your health care team will teach you about the changes you need to make in order to relieve your symptoms and lower your risk of going to the hospital. Follow the treatment plan as set by your health care provider. Medicines Medicines are important in reducing your heart's workload, slowing the progression of heart failure, and improving your symptoms.  Take over-the-counter and prescription medicines only as told by your health care provider.  Do not stop taking your medicine unless your health care provider tells you to do that.  Do not skip any dose of your medicine.  Refill prescriptions before you run out of medicine. You need your medicines every day.  Eating and drinking  Eat heart-healthy foods. Talk with a dietitian to make an eating plan that is right for you. ? If directed by your health care provider: ? Limit salt (sodium). Lowering your sodium intake may reduce symptoms of heart failure. Ask a dietitian to recommend heart-healthy seasonings. ? Limit your fluid intake. Fluid restriction may reduce symptoms of heart failure. ? Use low-fat cooking methods instead of frying. Low-fat methods include roasting, grilling, broiling, baking, poaching, steaming, and stir-frying. ? Choose foods that contain no trans fat and are low in saturated fat and cholesterol. Healthy choices include fresh or frozen fruits and  vegetables, fish, lean meats, legumes, fat-free or low-fat dairy products, and whole-grain or high-fiber foods.  Limit alcohol intake to no more than 1 drink a day for nonpregnant women and 2 drinks a day for men. One drink equals 12 oz of beer, 5 oz of wine, or 1 oz of hard liquor. ? Drinking more than that is harmful to your heart. Tell your health care provider if you drink alcohol several times a week. ? Talk with your health care provider about whether any level of alcohol use is safe for you. Activity  Ask your health care provider about attending cardiac rehabilitation. These programs include aerobic physical activity, which provides many benefits for your heart.  If no cardiac rehabilitation program is available, ask your health care provider what aerobic exercises are safe for you to do. Lifestyle Make the lifestyle changes recommended by your health care provider. In general:  Lose weight if your health care provider tells you to do that. Weight loss may reduce symptoms of heart failure.  Do not use any products that contain nicotine or tobacco, such as cigarettes or e-cigarettes. If you need help quitting, ask your health care provider.  Do not use street (illegal) drugs.  Return to your normal activities as told by your health care provider. Ask your health care provider what activities are safe for you.  General instructions  Make sure you weigh yourself every day to track your weight. Rapid weight gain may indicate an increase in fluid in your body and may increase the workload of your heart. ? Weigh yourself every morning.  Do this after you urinate but before you eat breakfast. ? Wear the same type of clothing, without shoes, each time you weigh yourself. ? Weigh yourself on the same scale and in the same spot each time.  Living with chronic heart failure often leads to emotions such as fear, stress, anxiety, and depression. If you feel any of these emotions and need help  coping, contact your health care provider. Other ways to get help include: ? Talking to friends and family members about your condition. They can give you support and guidance. Explain your symptoms to them and, if comfortable, invite them to attend appointments or rehabilitation with you. ? Joining a support group for people with chronic heart failure. Talking with other people who have the same symptoms may give you new ways of coping with your disease and your emotions.  Stay up to date with your shots (vaccines). Staying current on pneumococcal and influenza vaccines is especially important in preventing germs from attacking your airways (respiratory infections).  Keep all follow-up visits as told by your health care provider. This is important. How to recognize changes in your condition You and your family members need to know what changes to watch for in your condition. Watch for the following changes and report them to your health care provider:  Sudden weight gain. Ask your health care provider what amount of weight gain to report.  Shortness of breath: ? Feeling short of breath while at rest, with no exercise or activity that required great effort. ? Feeling breathless with activity.  Swelling of your lower legs or ankles.  Difficulty sleeping: ? You wake up feeling short of breath. ? You have to use more pillows to raise your head in order to sleep.  Frequent, dry, hacking cough.  Loss of appetite.  Feeling more tired all the time.  Depression or feelings of sadness or hopelessness.  Bloating in the stomach.  Where to find more information  Local support groups. Ask your health care provider about groups near you.  The American Heart Association: www.heart.org Contact a health care provider if:  You have a rapid weight gain.  You have increasing shortness of breath that is unusual for you.  You are unable to participate in your usual physical activities.  You  tire easily.  You cough more than normal, especially with physical activity.  You have any swelling or more swelling in areas such as your hands, feet, ankles, or abdomen.  You feel like your heart is beating quickly (palpitations).  You become dizzy or light-headed when you stand up. Get help right away if:  You have difficulty breathing.  You notice or your family notices a change in your awareness, such as having trouble staying awake or having difficulty with concentration.  You have pain or discomfort in your chest.  You have an episode of fainting (syncope). Summary  There is no cure for heart failure, so it is important for you to take good care of yourself and follow the treatment plan set by your health care provider.  Medicines are important in reducing your heart's workload, slowing the progression of heart failure, and improving your symptoms.  Living with chronic heart failure often leads to emotions such as fear, stress, anxiety, and depression. If you are feeling any of these emotions and need help coping, contact your health care provider. This information is not intended to replace advice given to you by your health care provider. Make sure you discuss any questions  you have with your health care provider. Document Released: 06/24/2016 Document Revised: 06/24/2016 Document Reviewed: 06/24/2016 Elsevier Interactive Patient Education  2018 [employer].  Vitamin B12 Deficiency Vitamin B12 deficiency means that your body is not getting enough vitamin B12. Your body needs vitamin B12 for important bodily functions. If you do not have enough vitamin B12 in your body, you can have health problems. Follow these instructions at home:  Take supplements only as told by your doctor. Follow the directions carefully.  Get any shots (injections) as told by your doctor. Do not miss your visits to the doctor.  Eat lots of healthy foods that contain vitamin B12. Ask your doctor  if you should work with someone who is trained in how food affects health (dietitian). Foods that contain vitamin B12 include: ? Meat. ? Meat from birds (poultry). ? Fish. ? Eggs. ? Cereal and dairy products that are fortified. This means that vitamin B12 has been added to the food. Check the label on the package to see if the food is fortified.  Do not drink too much (do not abuse) alcohol.  Keep all follow-up visits as told by your doctor. This is important. Contact a doctor if:  Your symptoms come back. Get help right away if:  You have trouble breathing.  You have chest pain.  You get dizzy.  You pass out (lose consciousness). This information is not intended to replace advice given to you by your health care provider. Make sure you discuss any questions you have with your health care provider. Document Released: 01/29/2011 Document Revised: 07/18/2015 Document Reviewed: 06/27/2014 Elsevier Interactive Patient Education  2018 [employer].  Peripheral Edema Peripheral edema is swelling that is caused by a buildup of fluid. Peripheral edema most often affects the lower legs, ankles, and feet. It can also develop in the arms, hands, and face. The area of the body that has peripheral edema will look swollen. It may also feel heavy or warm. Your clothes may start to feel tight. Pressing on the area may make a temporary dent in your skin. You may not be able to move your arm or leg as much as usual. There are many causes of peripheral edema. It can be a complication of other diseases, such as congestive heart failure, kidney disease, or a problem with your blood circulation. It also can be a side effect of certain medicines. It often happens to women during pregnancy. Sometimes, the cause is not known. Treating the underlying condition is often the only treatment for peripheral edema. Follow these instructions at home: Pay attention to any changes in your symptoms. Take these actions to  help with your discomfort:  Raise (elevate) your legs while you are sitting or lying down.  Move around often to prevent stiffness and to lessen swelling. Do not sit or stand for long periods of time.  Wear support stockings as told by your health care provider.  Follow instructions from your health care provider about limiting salt (sodium) in your diet. Sometimes eating less salt can reduce swelling.  Take over-the-counter and prescription medicines only as told by your health care provider. Your health care provider may prescribe medicine to help your body get rid of excess water (diuretic).  Keep all follow-up visits as told by your health care provider. This is important.  Contact a health care provider if:  You have a fever.  Your edema starts suddenly or is getting worse, especially if you are pregnant or have a medical  condition.  You have swelling in only one leg.  You have increased swelling and pain in your legs. Get help right away if:  You develop shortness of breath, especially when you are lying down.  You have pain in your chest or abdomen.  You feel weak.  You faint. This information is not intended to replace advice given to you by your health care provider. Make sure you discuss any questions you have with your health care provider. Document Released: 03/19/2004 Document Revised: 07/15/2015 Document Reviewed: 08/22/2014 Elsevier Interactive Patient Education  [employer].

## 2018-01-17 NOTE — Progress Notes (Signed)
Subjective:    Patient ID: Nicole Blackwell, female    DOB: [birth year], 82 y.o.   MRN: [id number]  No chief complaint on file. Pt accompanied by her daughter.  HPI Patient was seen today for f/u.    B/L LE edema: -h/o CHF.  -denies SOB.   -Not on a diuretic or beta blocker.  -Has not had f/u with Cardiology.  -ECHO 09/26/16  -pt occasionally elevates feet when sitting  Toenail concerns: -pt with overgrown toe nails -has seen podiatry in the past  B12 def: -B12 obtained at last OFV -b12 was 200 -unable to reach pt or daughter by phone  Dementia, vascular: -stable -no longer on aricept  Past Medical History:  Diagnosis Date  . BACK PAIN   . OSTEOPOROSIS   . PLANTAR FASCIITIS   . Positive PPD   . SHINGLES   . SYMPTOMATIC MENOPAUSAL/FEMALE CLIMACTERIC STATES   . Syncope and collapse 12/13/2014  . Uterine cancer (HCC)     Allergies  Allergen Reactions  . Penicillin G Benzathine Swelling  . Penicillins Swelling    ROS General: Denies fever, chills, night sweats, changes in weight, changes in appetite HEENT: Denies headaches, ear pain, changes in vision, rhinorrhea, sore throat CV: Denies CP, palpitations, SOB, orthopnea  +LE edema Pulm: Denies SOB, cough, wheezing GI: Denies abdominal pain, nausea, vomiting, diarrhea, constipation GU: Denies dysuria, hematuria, frequency, vaginal discharge Msk: Denies muscle cramps, joint pains  +foot pain, overgrown toe nails. Neuro: Denies weakness, numbness, tingling Skin: Denies rashes, bruising Psych: Denies depression, anxiety, hallucinations  +dementia    Objective:    Blood pressure 126/82, pulse 64, temperature 97.7 F (36.5 C), temperature source Oral, weight 153 lb (69.4 kg), SpO2 97 %.   Gen. Pleasant, well-nourished, in no distress, normal affect   HEENT: [place]/AT, face symmetric, no scleral icterus, PERRLA,  nares patent without drainage Lungs: no accessory muscle use, CTAB, no wheezes or rales Cardiovascular: RRR,  no m/r/g, 1+ pitting edema in feet and ankles.  Trace edema at b/l knees Abdomen: BS present, soft, NT/ND Musculoskeletal: No deformities, no cyanosis or clubbing, normal tone Neuro:  A&Ox3, CN II-XII intact, normal gait Skin:  Warm, no lesions/ rash.  Dark toenails of b/l feet overgrown and curling underneath toes.   Wt Readings from Last 3 Encounters:  01/17/18 153 lb (69.4 kg)  12/15/17 161 lb (73 kg)  10/05/16 152 lb 6.4 oz (69.1 kg)    Lab Results  Component Value Date   WBC 6.1 12/15/2017   HGB 14.6 12/15/2017   HCT 45.1 12/15/2017   PLT 222.0 12/15/2017   GLUCOSE 116 (H) 12/15/2017   CHOL 227 (H) 12/18/2015   TRIG 84.0 12/18/2015   HDL 60.00 12/18/2015   LDLDIRECT 154.0 08/31/2011   LDLCALC 150 (H) 12/18/2015   ALT 15 09/26/2016   AST 27 09/26/2016   NA 138 12/15/2017   K 4.2 12/15/2017   CL 100 12/15/2017   CREATININE 0.80 12/15/2017   BUN 17 12/15/2017   CO2 29 12/15/2017   TSH 1.60 12/15/2017    Assessment/Plan:  Bilateral lower extremity edema  -likely 2/2 diastolic CHF vs prolonged sitting -last BNP 52 on 12/15/17 -advised to elevated feet when sitting. -discussed TED hose. -Plan: furosemide (LASIX) 20 MG tablet  Vascular dementia without behavioral disturbance (HCC) -stable -no longer on Aricept  Overgrown toenails  - Plan: Ambulatory referral to Podiatry  B12 deficiency -B12 injection this visit.  Need for immunization against influenza -influenza vaccine this visit  Chronic diastolic congestive heart failure (HCC)  -not currently on meds. -ECHO 09/26/16 with grade 1 diastolic dysfunction, R heart volume overload, consider eval for occult left to right shunt - Plan: Ambulatory referral to Cardiology  F/u in 1 month  Grier Mitts, MD

## 2018-02-10 ENCOUNTER — Ambulatory Visit (INDEPENDENT_AMBULATORY_CARE_PROVIDER_SITE_OTHER): Payer: Medicare HMO | Admitting: Podiatry

## 2018-02-10 ENCOUNTER — Encounter: Payer: Self-pay | Admitting: Podiatry

## 2018-02-10 VITALS — BP 150/84

## 2018-02-10 DIAGNOSIS — L84 Corns and callosities: Secondary | ICD-10-CM

## 2018-02-10 DIAGNOSIS — M79674 Pain in right toe(s): Secondary | ICD-10-CM | POA: Diagnosis not present

## 2018-02-10 DIAGNOSIS — B351 Tinea unguium: Secondary | ICD-10-CM

## 2018-02-10 DIAGNOSIS — R600 Localized edema: Secondary | ICD-10-CM | POA: Diagnosis not present

## 2018-02-10 DIAGNOSIS — M79675 Pain in left toe(s): Secondary | ICD-10-CM

## 2018-02-10 NOTE — Patient Instructions (Addendum)
PLEASE SEE PCP FOR PRESCRIPTION FOR COMPRESSION STOCKINGS> AND TO ALSO GET GOLDBOND DIABETIC FOOT CREAM Edema  Edema is when you have too much fluid in your body or under your skin. Edema may make your legs, feet, and ankles swell up. Swelling is also common in lo  oser tissues, like around your eyes. This is a common condition. It gets more common as you get older. There are many possible causes of edema. Eating too much salt (sodium) and being on your feet or sitting for a long time can cause edema in your legs, feet, and ankles. Hot weather may make edema worse. Edema is usually painless. Your skin may look swollen or shiny. Follow these instructions at home:  Keep the swollen body part raised (elevated) above the level of your heart when you are sitting or lying down.  Do not sit still or stand for a long time.  Do not wear tight clothes. Do not wear garters on your upper legs.  Exercise your legs. This can help the swelling go down.  Wear elastic bandages or support stockings as told by your doctor.  Eat a low-salt (low-sodium) diet to reduce fluid as told by your doctor.  Depending on the cause of your swelling, you may need to limit how much fluid you drink (fluid restriction).  Take over-the-counter and prescription medicines only as told by your doctor. Contact a doctor if:  Treatment is not working.  You have heart, liver, or kidney disease and have symptoms of edema.  You have sudden and unexplained weight gain. Get help right away if:  You have shortness of breath or chest pain.  You cannot breathe when you lie down.  You have pain, redness, or warmth in the swollen areas.  You have heart, liver, or kidney disease and get edema all of a sudden.  You have a fever and your symptoms get worse all of a sudden. Summary  Edema is when you have too much fluid in your body or under your skin.  Edema may make your legs, feet, and ankles swell up. Swelling is also  common in looser tissues, like around your eyes.  Raise (elevate) the swollen body part above the level of your heart when you are sitting or lying down.  Follow your doctor's instructions about diet and how much fluid you can drink (fluid restriction). This information is not intended to replace advice given to you by your health care provider. Make sure you discuss any questions you have with your health care provider. Document Released: 07/29/2007 Document Revised: 02/28/2016 Document Reviewed: 02/28/2016 Elsevier Interactive Patient Education  2019 [employer]  Onychomycosis/Fungal Toenails  WHAT IS IT? An infection that lies within the keratin of your nail plate that is caused by a fungus.  WHY ME? Fungal infections affect all ages, sexes, races, and creeds.  There may be many factors that predispose you to a fungal infection such as age, coexisting medical conditions such as diabetes, or an autoimmune disease; stress, medications, fatigue, genetics, etc.  Bottom line: fungus thrives in a warm, moist environment and your shoes offer such a location.  IS IT CONTAGIOUS? Theoretically, yes.  You do not want to share shoes, nail clippers or files with someone who has fungal toenails.  Walking around barefoot in the same room or sleeping in the same bed is unlikely to transfer the organism.  It is important to realize, however, that fungus can spread easily from one nail to the next on the  same foot.  HOW DO WE TREAT THIS?  There are several ways to treat this condition.  Treatment may depend on many factors such as age, medications, pregnancy, liver and kidney conditions, etc.  It is best to ask your doctor which options are available to you.  1. No treatment.   Unlike many other medical concerns, you can live with this condition.  However for many people this can be a painful condition and may lead to ingrown toenails or a bacterial infection.  It is recommended that you keep the nails cut  short to help reduce the amount of fungal nail. 2. Topical treatment.  These range from herbal remedies to prescription strength nail lacquers.  About 40-50% effective, topicals require twice daily application for approximately 9 to 12 months or until an entirely new nail has grown out.  The most effective topicals are medical grade medications available through physicians offices. 3. Oral antifungal medications.  With an 80-90% cure rate, the most common oral medication requires 3 to 4 months of therapy and stays in your system for a year as the new nail grows out.  Oral antifungal medications do require blood work to make sure it is a safe drug for you.  A liver function panel will be performed prior to starting the medication and after the first month of treatment.  It is important to have the blood work performed to avoid any harmful side effects.  In general, this medication safe but blood work is required. 4. Laser Therapy.  This treatment is performed by applying a specialized laser to the affected nail plate.  This therapy is noninvasive, fast, and non-painful.  It is not covered by insurance and is therefore, out of pocket.  The results have been very good with a 80-95% cure rate.  The [facility] is the only practice in the area to offer this therapy. 5. Permanent Nail Avulsion.  Removing the entire nail so that a new nail will not grow back.  Corns and Calluses Corns are small areas of thickened skin that occur on the top, sides, or tip of a toe. They contain a cone-shaped core with a point that can press on a nerve below. This causes pain.  Calluses are areas of thickened skin that can occur anywhere on the body, including the hands, fingers, palms, soles of the feet, and heels. Calluses are usually larger than corns. What are the causes? Corns and calluses are caused by rubbing (friction) or pressure, such as from shoes that are too tight or do not fit properly. What increases the  risk? Corns are more likely to develop in people who have misshapen toes (toe deformities), such as hammer toes. Calluses can occur with friction to any area of the skin. They are more likely to develop in people who:  Work with their hands.  Wear shoes that fit poorly, are too tight, or are high-heeled.  Have toe deformities. What are the signs or symptoms? Symptoms of a corn or callus include:  A hard growth on the skin.  Pain or tenderness under the skin.  Redness and swelling.  Increased discomfort while wearing tight-fitting shoes, if your feet are affected. If a corn or callus becomes infected, symptoms may include:  Redness and swelling that gets worse.  Pain.  Fluid, blood, or pus draining from the corn or callus. How is this diagnosed? Corns and calluses may be diagnosed based on your symptoms, your medical history, and a physical exam. How  is this treated? Treatment for corns and calluses may include:  Removing the cause of the friction or pressure. This may involve: ? Changing your shoes. ? Wearing shoe inserts (orthotics) or other protective layers in your shoes, such as a corn pad. ? Wearing gloves.  Applying medicine to the skin (topical medicine) to help soften skin in the hardened, thickened areas.  Removing layers of dead skin with a file to reduce the size of the corn or callus.  Removing the corn or callus with a scalpel or laser.  Taking antibiotic medicines, if your corn or callus is infected.  Having surgery, if a toe deformity is the cause. Follow these instructions at home:   Take over-the-counter and prescription medicines only as told by your health care provider.  If you were prescribed an antibiotic, take it as told by your health care provider. Do not stop taking it even if your condition starts to improve.  Wear shoes that fit well. Avoid wearing high-heeled shoes and shoes that are too tight or too loose.  Wear any padding,  protective layers, gloves, or orthotics as told by your health care provider.  Soak your hands or feet and then use a file or pumice stone to soften your corn or callus. Do this as told by your health care provider.  Check your corn or callus every day for symptoms of infection. Contact a health care provider if you:  Notice that your symptoms do not improve with treatment.  Have redness or swelling that gets worse.  Notice that your corn or callus becomes painful.  Have fluid, blood, or pus coming from your corn or callus.  Have new symptoms. Summary  Corns are small areas of thickened skin that occur on the top, sides, or tip of a toe.  Calluses are areas of thickened skin that can occur anywhere on the body, including the hands, fingers, palms, and soles of the feet. Calluses are usually larger than corns.  Corns and calluses are caused by rubbing (friction) or pressure, such as from shoes that are too tight or do not fit properly.  Treatment may include wearing any padding, protective layers, gloves, or orthotics as told by your health care provider. This information is not intended to replace advice given to you by your health care provider. Make sure you discuss any questions you have with your health care provider. Document Released: 11/16/2003 Document Revised: 12/23/2016 Document Reviewed: 12/23/2016 Elsevier Interactive Patient Education  2019 [employer].

## 2018-02-11 ENCOUNTER — Ambulatory Visit (INDEPENDENT_AMBULATORY_CARE_PROVIDER_SITE_OTHER): Payer: Medicare HMO | Admitting: Family Medicine

## 2018-02-11 ENCOUNTER — Encounter: Payer: Self-pay | Admitting: Family Medicine

## 2018-02-11 VITALS — BP 128/80 | HR 64 | Temp 97.6°F | Wt 161.0 lb

## 2018-02-11 DIAGNOSIS — E538 Deficiency of other specified B group vitamins: Secondary | ICD-10-CM

## 2018-02-11 DIAGNOSIS — F0150 Vascular dementia without behavioral disturbance: Secondary | ICD-10-CM | POA: Diagnosis not present

## 2018-02-11 DIAGNOSIS — R600 Localized edema: Secondary | ICD-10-CM | POA: Diagnosis not present

## 2018-02-11 DIAGNOSIS — F0390 Unspecified dementia without behavioral disturbance: Secondary | ICD-10-CM | POA: Diagnosis not present

## 2018-02-11 NOTE — Progress Notes (Signed)
Subjective:    Patient ID: Nicole Blackwell, female    DOB: [birth year], 82 y.o.   MRN: [id number]  No chief complaint on file. Pt accompanied by her daughter.  HPI Patient was seen today for f/u.  B12: -last injection 11/25 -B12 was 200  LE edema: -completed 3 days of lasix -BNP was 52 on 01/17/18 -notes improvement in edema, but still has some -occasionally elevating feet when sitting -did not get TED hose -seen by podiatry yesterday for nail clipping.  Advised to get TED hose. -denies SOB, CP. -has some soreness in legs when swollen -has been doing a lot of standing/walking around per daughter.  Dementia: -pt doing ok -still living at home.  Family checks on her frequently -not currently on meds.  Past Medical History:  Diagnosis Date  . BACK PAIN   . OSTEOPOROSIS   . PLANTAR FASCIITIS   . Positive PPD   . SHINGLES   . SYMPTOMATIC MENOPAUSAL/FEMALE CLIMACTERIC STATES   . Syncope and collapse 12/13/2014  . Uterine cancer (HCC)     Allergies  Allergen Reactions  . Penicillin G Benzathine Swelling  . Penicillins Swelling    ROS General: Denies fever, chills, night sweats, changes in weight, changes in appetite HEENT: Denies headaches, ear pain, changes in vision, rhinorrhea, sore throat CV: Denies CP, palpitations, SOB, orthopnea Pulm: Denies SOB, cough, wheezing GI: Denies abdominal pain, nausea, vomiting, diarrhea, constipation GU: Denies dysuria, hematuria, frequency, vaginal discharge Msk: Denies muscle cramps, joint pains  +LE edema Neuro: Denies weakness, numbness, tingling Skin: Denies rashes, bruising Psych: Denies depression, anxiety, hallucinations      Objective:    Blood pressure 128/80, pulse 64, temperature 97.6 F (36.4 C), temperature source Oral, weight 161 lb (73 kg), SpO2 97 %.   Gen. Pleasant, well-nourished, in no distress, normal affect  HEENT: [place]/AT, face symmetric, no scleral icterus, PERRLA, nares patent without drainage Lungs:  no accessory muscle use, CTAB, no wheezes or rales Cardiovascular: RRR, no m/r/g, 1 + pitting edema Musculoskeletal: TTP of LEs with edema.  No deformities, no cyanosis or clubbing, normal tone Neuro:  A&Ox3, CN II-XII intact, normal gait Skin:  Warm, no lesions/ rash  Wt Readings from Last 3 Encounters:  02/11/18 161 lb (73 kg)  01/17/18 153 lb (69.4 kg)  12/15/17 161 lb (73 kg)    Lab Results  Component Value Date   WBC 6.1 12/15/2017   HGB 14.6 12/15/2017   HCT 45.1 12/15/2017   PLT 222.0 12/15/2017   GLUCOSE 116 (H) 12/15/2017   CHOL 227 (H) 12/18/2015   TRIG 84.0 12/18/2015   HDL 60.00 12/18/2015   LDLDIRECT 154.0 08/31/2011   LDLCALC 150 (H) 12/18/2015   ALT 15 09/26/2016   AST 27 09/26/2016   NA 138 12/15/2017   K 4.2 12/15/2017   CL 100 12/15/2017   CREATININE 0.80 12/15/2017   BUN 17 12/15/2017   CO2 29 12/15/2017   TSH 1.60 12/15/2017    Assessment/Plan:  B12 deficiency -B12 injection due on or after 02/16/18 -B12 was 200 on 12/15/17 -given handout  Vascular dementia without behavioral disturbance (Nicole Blackwell) -stable -does not wish to restart meds at this time. -continue to monitor -discussed increased supervision in the home to prevent accidents  Bilateral lower extremity edema  -improving -would benefit from TED hose.  Pt's daughter to obtain today -discussed elevating extremities when sitting. -BNP was 52 on 12/15/17 -given handout  F/u in the next few months.  Grier Mitts, MD

## 2018-02-11 NOTE — Patient Instructions (Signed)
Vitamin B12 Deficiency Vitamin B12 deficiency occurs when the body does not have enough vitamin B12. Vitamin B12 is an important vitamin. The body needs vitamin B12:  To make red blood cells.  To make DNA. This is the genetic material inside cells.  To help the nerves work properly so they can carry messages from the brain to the body. Vitamin B12 deficiency can cause various health problems, such as a low red blood cell count (anemia) or nerve damage. What are the causes? This condition may be caused by:  Not eating enough foods that contain vitamin B12.  Not having enough stomach acid and digestive fluids to properly absorb vitamin B12 from the food that you eat.  Certain digestive system diseases that make it hard to absorb vitamin B12. These diseases include Crohn disease, chronic pancreatitis, and cystic fibrosis.  Pernicious anemia. This is a condition in which the body does not make enough of a protein (intrinsic factor), resulting in too few red blood cells.  Having a surgery in which part of the stomach or small intestine is removed.  Taking certain medicines that make it hard for the body to absorb vitamin B12. These medicines include: ? Heartburn medicine (antacids and proton pump inhibitors). ? An antibiotic medicine called neomycin. ? Some medicines that are used to treat diabetes, tuberculosis, gout, or high cholesterol. What increases the risk? The following factors may make you more likely to develop a B12 deficiency:  Being older than age 69.  Eating a vegetarian or vegan diet, especially while you are pregnant.  Eating a poor diet while you are pregnant.  Taking certain drugs.  Having alcoholism. What are the signs or symptoms? In some cases, there are no symptoms of this condition. If the condition leads to anemia or nerve damage, various symptoms can occur, such as:  Weakness.  Fatigue.  Loss of appetite.  Weight loss.  Numbness or tingling in your  hands and feet.  Redness and burning of the tongue.  Confusion or memory problems.  Depression.  Sensory problems, such as color blindness, ringing in the ears, or loss of taste.  Diarrhea or constipation.  Trouble walking. If anemia is severe, symptoms can include:  Shortness of breath.  Dizziness.  Rapid heart rate (tachycardia).  How is this diagnosed? This condition may be diagnosed with a blood test to measure the level of vitamin B12 in your blood. You may have other tests to help find the cause of your vitamin B12 deficiency. These tests may include:  A complete blood count (CBC). This is a group of tests that measure certain characteristics of blood cells.  A blood test to measure intrinsic factor.  An endoscopy. In this procedure, a thin tube with a camera on the end is used to look into your stomach or intestines. How is this treated? Treatment for this condition depends on the cause. Common treatment options include:  Changing your eating and drinking habits, such as: ? Eating more foods that contain vitamin B12. ? Drinking less alcohol or no alcohol.  Taking vitamin B12 supplements. Your health care provider will tell you which dosage is best for you.  Getting vitamin B12 injections. Follow these instructions at home:  Take supplements only as told by your health care provider. Follow the directions carefully.  Get any injections that are prescribed by your health care provider.  Do not miss your appointments.  Eat lots of healthy foods that contain vitamin B12. Ask your health care provider if  you should work with a Microbiologist. Foods that contain vitamin B12 include: ? Meat. ? Meat from birds (poultry). ? Fish. ? Eggs. ? Cereal and dairy products that are fortified. This means that vitamin B12 has been added to the food. Check the label on the package to see if the food is fortified.  Do not abuse alcohol.  Keep all follow-up visits as told by your  health care provider. This is important. Contact a health care provider if:  Your symptoms come back. Get help right away if:  You develop shortness of breath.  You have chest pain.  You become dizzy or you lose consciousness. This information is not intended to replace advice given to you by your health care provider. Make sure you discuss any questions you have with your health care provider. Document Released: 05/04/2011 Document Revised: 07/24/2015 Document Reviewed: 06/27/2014 Elsevier Interactive Patient Education  2019 [employer]  Edema  Edema is when you have too much fluid in your body or under your skin. Edema may make your legs, feet, and ankles swell up. Swelling is also common in looser tissues, like around your eyes. This is a common condition. It gets more common as you get older. There are many possible causes of edema. Eating too much salt (sodium) and being on your feet or sitting for a long time can cause edema in your legs, feet, and ankles. Hot weather may make edema worse. Edema is usually painless. Your skin may look swollen or shiny. Follow these instructions at home:  Keep the swollen body part raised (elevated) above the level of your heart when you are sitting or lying down.  Do not sit still or stand for a long time.  Do not wear tight clothes. Do not wear garters on your upper legs.  Exercise your legs. This can help the swelling go down.  Wear elastic bandages or support stockings as told by your doctor.  Eat a low-salt (low-sodium) diet to reduce fluid as told by your doctor.  Depending on the cause of your swelling, you may need to limit how much fluid you drink (fluid restriction).  Take over-the-counter and prescription medicines only as told by your doctor. Contact a doctor if:  Treatment is not working.  You have heart, liver, or kidney disease and have symptoms of edema.  You have sudden and unexplained weight gain. Get help right  away if:  You have shortness of breath or chest pain.  You cannot breathe when you lie down.  You have pain, redness, or warmth in the swollen areas.  You have heart, liver, or kidney disease and get edema all of a sudden.  You have a fever and your symptoms get worse all of a sudden. Summary  Edema is when you have too much fluid in your body or under your skin.  Edema may make your legs, feet, and ankles swell up. Swelling is also common in looser tissues, like around your eyes.  Raise (elevate) the swollen body part above the level of your heart when you are sitting or lying down.  Follow your doctor's instructions about diet and how much fluid you can drink (fluid restriction). This information is not intended to replace advice given to you by your health care provider. Make sure you discuss any questions you have with your health care provider. Document Released: 07/29/2007 Document Revised: 02/28/2016 Document Reviewed: 02/28/2016 Elsevier Interactive Patient Education  2019 [employer].  Dementia Dementia means losing some of your  brain ability. People with dementia may have problems with:  Memory.  Making decisions.  Behavior.  Speaking.  Thinking.  Solving problems. Follow these instructions at home: Medicine  Take over-the-counter and prescription medicines only as told by your doctor.  Avoid taking medicines that can change how you think. These include pain or sleeping medicines. Lifestyle   Make healthy choices: ? Be active as told by your doctor. ? Do not use any tobacco products, such as cigarettes, chewing tobacco, and e-cigarettes. If you need help quitting, ask your doctor. ? Eat a healthy diet. ? When you get stressed, do something to help yourself relax. Your doctor can give you tips. ? Spend time with other people.  Drink enough fluid to keep your pee (urine) clear or pale yellow.  Make sure you get good sleep. Use these tips to help you  get a good night's rest: ? Try not to take naps during the day. ? Keep your sleeping area dark and cool. ? In the few hours before you go to bed, try not to do any exercise. ? Try not to have foods and drinks with caffeine in the evening. General instructions  Talk with your doctor to figure out: ? What you need help with. ? What your safety needs are.  If you were given a bracelet that tracks your location, make sure to wear it.  Keep all follow-up visits as told by your doctor. This is important. Contact a doctor if:  You have any new problems.  You have problems with choking or swallowing.  You have any symptoms of a different sickness. Get help right away if:  You have a fever.  You feel mixed up (confused) or more mixed up than before.  You have new sleepiness.  You have sleepiness that gets worse.  You have a hard time staying awake.  You or your family members are worried for your safety. This information is not intended to replace advice given to you by your health care provider. Make sure you discuss any questions you have with your health care provider. Document Released: 01/23/2008 Document Revised: 07/18/2015 Document Reviewed: 11/07/2014 Elsevier Interactive Patient Education  2019 [employer].

## 2018-02-17 ENCOUNTER — Ambulatory Visit (INDEPENDENT_AMBULATORY_CARE_PROVIDER_SITE_OTHER): Payer: Medicare HMO

## 2018-02-17 DIAGNOSIS — E538 Deficiency of other specified B group vitamins: Secondary | ICD-10-CM

## 2018-02-17 MED ORDER — CYANOCOBALAMIN 1000 MCG/ML IJ SOLN
1000.0000 ug | Freq: Once | INTRAMUSCULAR | Status: AC
  Administered 2018-02-17: 1000 ug via INTRAMUSCULAR

## 2018-02-17 NOTE — Progress Notes (Signed)
Patient given Vitamin B12 injection in Right Arm today. Patient tolerated well.

## 2018-03-14 ENCOUNTER — Encounter: Payer: Self-pay | Admitting: Family Medicine

## 2018-03-14 ENCOUNTER — Ambulatory Visit (INDEPENDENT_AMBULATORY_CARE_PROVIDER_SITE_OTHER): Payer: Medicare HMO | Admitting: Family Medicine

## 2018-03-14 VITALS — BP 132/78 | HR 70 | Temp 98.3°F | Wt 160.0 lb

## 2018-03-14 DIAGNOSIS — E538 Deficiency of other specified B group vitamins: Secondary | ICD-10-CM | POA: Diagnosis not present

## 2018-03-14 DIAGNOSIS — R600 Localized edema: Secondary | ICD-10-CM

## 2018-03-14 DIAGNOSIS — F0150 Vascular dementia without behavioral disturbance: Secondary | ICD-10-CM | POA: Diagnosis not present

## 2018-03-14 MED ORDER — CYANOCOBALAMIN 1000 MCG/ML IJ SOLN
1000.0000 ug | Freq: Once | INTRAMUSCULAR | Status: AC
  Administered 2018-03-14: 1000 ug via INTRAMUSCULAR

## 2018-03-14 NOTE — Progress Notes (Signed)
Subjective:    Patient ID: Nicole Blackwell, female    DOB: 1935/09/10, 83 y.o.   MRN: [id number]  No chief complaint on file. Pt accompanied by her daughter.  HPI Patient was seen today for f/u.  Pt got B12 injection on 02/17/18.  Daughter inquires if pt can get B12 injection today.   Since last OFV, pt's daughter got her TED hose, but she lost them.  Pt has been wearing compression socks and occasionally elevating her feet when she is sitting.  Pt denies SOB, pain in lower extremities, increased edema.  Dementia: Pt's daughter states she has been trying to get pt out of the house during the day, but she doesn't want to go to any programs.  Pt requires more supervision.  Pt's daughter states she notices more changes in her memory.  Pt was on Aricept in the past, unsure if she was taking it consistently.  In the past pt has not wanted to be on additional meds.   Past Medical History:  Diagnosis Date  . BACK PAIN   . OSTEOPOROSIS   . PLANTAR FASCIITIS   . Positive PPD   . SHINGLES   . SYMPTOMATIC MENOPAUSAL/FEMALE CLIMACTERIC STATES   . Syncope and collapse 12/13/2014  . Uterine cancer (HCC)     Allergies  Allergen Reactions  . Penicillin G Benzathine Swelling  . Penicillins Swelling    ROS General: Denies fever, chills, night sweats, changes in weight, changes in appetite  +dementia HEENT: Denies headaches, ear pain, changes in vision, rhinorrhea, sore throat CV: Denies CP, palpitations, SOB, orthopnea Pulm: Denies SOB, cough, wheezing GI: Denies abdominal pain, nausea, vomiting, diarrhea, constipation GU: Denies dysuria, hematuria, frequency, vaginal discharge Msk: Denies muscle cramps, joint pains  +LE edema Neuro: Denies weakness, numbness, tingling Skin: Denies rashes, bruising Psych: Denies depression, anxiety, hallucinations    Objective:    Blood pressure 132/78, pulse 70, temperature 98.3 F (36.8 C), temperature source Oral, weight 160 lb (72.6 kg), SpO2 99  %.  Gen. Pleasant, well-nourished, in no distress, normal affect  HEENT: [place]/AT, face symmetric, no scleral icterus, PERRLA,  nares patent without drainage Lungs: no accessory muscle use, CTAB, no wheezes or rales Cardiovascular: RRR, no m/r/g, trace pitting edema in b/l LEs Neuro:  A&Ox3, CN II-XII intact, normal gait  Wt Readings from Last 3 Encounters:  03/14/18 160 lb (72.6 kg)  02/11/18 161 lb (73 kg)  01/17/18 153 lb (69.4 kg)    Lab Results  Component Value Date   WBC 6.1 12/15/2017   HGB 14.6 12/15/2017   HCT 45.1 12/15/2017   PLT 222.0 12/15/2017   GLUCOSE 116 (H) 12/15/2017   CHOL 227 (H) 12/18/2015   TRIG 84.0 12/18/2015   HDL 60.00 12/18/2015   LDLDIRECT 154.0 08/31/2011   LDLCALC 150 (H) 12/18/2015   ALT 15 09/26/2016   AST 27 09/26/2016   NA 138 12/15/2017   K 4.2 12/15/2017   CL 100 12/15/2017   CREATININE 0.80 12/15/2017   BUN 17 12/15/2017   CO2 29 12/15/2017   TSH 1.60 12/15/2017    Assessment/Plan:  Vascular dementia without behavioral disturbance ([place]) -stable -discussed finding a day program for pt to attend or activities at the senior center. -pt's daughter given a caregiver handout.  Bilateral lower extremity edema -improving -trace edema on exam today -continue elevating LEs -discussed purchasing a new pair of TED hose.  For now continue compression socks.  Cyanocobalamin deficiency -B12 injection given early -pt and daughter advised to  make nurses visits for monthly B12 injection   F/u in 3 months, sooner if needed.  Grier Mitts, MD

## 2018-03-14 NOTE — Patient Instructions (Addendum)
You can schedule a nurse's visit each month for your B12 injections.  Follow-up with this provider in 3 months, sooner if needed.  Dementia Caregiver Guide Dementia is a term used to describe a number of symptoms that affect memory and thinking. The most common symptoms include:  Memory loss.  Trouble with language and communication.  Trouble concentrating.  Poor judgment.  Problems with reasoning.  Child-like behavior and language.  Extreme anxiety.  Angry outbursts.  Wandering from home or public places. Dementia usually gets worse slowly over time. In the early stages, people with dementia can stay independent and safe with some help. In later stages, they need help with daily tasks such as dressing, grooming, and using the bathroom. How to help the person with dementia cope Dementia can be frightening and confusing. Here are some tips to help the person with dementia cope with changes caused by the disease. General tips  Keep the person on track with his or her routine.  Try to identify areas where the person may need help.  Be supportive, patient, calm, and encouraging.  Gently remind the person that adjusting to changes takes time.  Help with the tasks that the person has asked for help with.  Keep the person involved in daily tasks and decisions as much as possible.  Encourage conversation, but try not to get frustrated or harried if the person struggles to find words or does not seem to appreciate your help. Communication tips  When the person is talking or seems frustrated, make eye contact and hold the person's hand.  Ask specific questions that need yes or no answers.  Use simple words, short sentences, and a calm voice. Only give one direction at a time.  When offering choices, limit them to just 1 or 2.  Avoid correcting the person in a negative way.  If the person is struggling to find the right words, gently try to help him or her. How to  recognize symptoms of stress Symptoms of stress in caregivers include:  Feeling frustrated or angry with the person with dementia.  Denying that the person has dementia or that his or her symptoms will not improve.  Feeling hopeless and unappreciated.  Difficulty sleeping.  Difficulty concentrating.  Feeling anxious, irritable, or depressed.  Developing stress-related health problems.  Feeling like you have too little time for your own life. Follow these instructions at home:   Make sure that you and the person you are caring for: ? Get regular sleep. ? Exercise regularly. ? Eat regular, nutritious meals. ? Drink enough fluid to keep your urine clear or pale yellow. ? Take over-the-counter and prescription medicines only as told by your health care providers. ? Attend all scheduled health care appointments.  Join a support group with others who are caregivers.  Ask about respite care resources so that you can have a regular break from the stress of caregiving.  Look for signs of stress in yourself and in the person you are caring for. If you notice signs of stress, take steps to manage it.  Consider any safety risks and take steps to avoid them.  Organize medications in a pill box for each day of the week.  Create a plan to handle any legal or financial matters. Get legal or financial advice if needed.  Keep a calendar in a central location to remind the person of appointments or other activities. Tips for reducing the risk of injury  Keep floors clear of clutter. Remove rugs,  magazine racks, and floor lamps.  Keep hallways well lit, especially at night.  Put a handrail and nonslip mat in the bathtub or shower.  Put childproof locks on cabinets that contain dangerous items, such as medicines, alcohol, guns, toxic cleaning items, sharp tools or utensils, matches, and lighters.  Put the locks in places where the person cannot see or reach them easily. This will help  ensure that the person does not wander out of the house and get lost.  Be prepared for emergencies. Keep a list of emergency phone numbers and addresses in a convenient area.  Remove car keys and lock garage doors so that the person does not try to get in the car and drive.  Have the person wear a bracelet that tracks locations and identifies the person as having memory problems. This should be worn at all times for safety. Where to find support: Many individuals and organizations offer support. These include:  Support groups for people with dementia and for caregivers.  Counselors or therapists.  Home health care services.  Adult day care centers. Where to find more information [employer]: [employer] Contact a health care provider if:  The person's health is rapidly getting worse.  You are no longer able to care for the person.  Caring for the person is affecting your physical and emotional health.  The person threatens himself or herself, you, or anyone else. Summary  Dementia is a term used to describe a number of symptoms that affect memory and thinking.  Dementia usually gets worse slowly over time.  Take steps to reduce the person's risk of injury, and to plan for future care.  Caregivers need support, relief from caregiving, and time for their own lives. This information is not intended to replace advice given to you by your health care provider. Make sure you discuss any questions you have with your health care provider. Document Released: 01/14/2016 Document Revised: 01/14/2016 Document Reviewed: 01/14/2016 Elsevier Interactive Patient Education  2019 [employer].

## 2018-03-18 ENCOUNTER — Encounter: Payer: Self-pay | Admitting: Podiatry

## 2018-03-18 NOTE — Progress Notes (Signed)
Subjective: Nicole Blackwell presents today referred by Billie Ruddy, MD with cc of painful, discolored, thick toenails which interfere with daily activities.  Pain is aggravated when wearing enclosed shoe gear. Pain is relieved with periodic professional debridement.  Past Medical History:  Diagnosis Date  . BACK PAIN   . OSTEOPOROSIS   . PLANTAR FASCIITIS   . Positive PPD   . SHINGLES   . SYMPTOMATIC MENOPAUSAL/FEMALE CLIMACTERIC STATES   . Syncope and collapse 12/13/2014  . Uterine cancer [facility])      Patient Active Problem List   Diagnosis Date Noted  . B12 deficiency 01/17/2018  . Vascular dementia without behavioral disturbance ([place]) 01/17/2018  . Laceration of scalp without foreign body   . Diastolic CHF ([place]) 32/95/1884  . Syncope 01/12/2012  . BACK PAIN 04/30/2008  . SHINGLES 09/27/2007  . SYMPTOMATIC MENOPAUSAL/FEMALE CLIMACTERIC STATES 06/20/2007  . Osteoporosis 03/11/2007  . PLANTAR FASCIITIS 11/16/2006     Past Surgical History:  Procedure Laterality Date  . CATARACT EXTRACTION, BILATERAL Bilateral   . DILATION AND CURETTAGE OF UTERUS  1960's X 2   "I lost 2 children to miscarriage"  . LAPAROSCOPIC CHOLECYSTECTOMY    . SIGMOIDOSCOPY    . TONSILLECTOMY    . VAGINAL HYSTERECTOMY        Current Outpatient Medications:  .  aspirin 81 MG tablet, Take 81 mg by mouth at bedtime. , Disp: , Rfl:  .  furosemide (LASIX) 20 MG tablet, Take 1 tablet (20 mg total) by mouth daily for 3 days., Disp: 3 tablet, Rfl: 0   Allergies  Allergen Reactions  . Penicillin G Benzathine Swelling  . Penicillins Swelling     Social History   Occupational History  . Not on file  Tobacco Use  . Smoking status: Never Smoker  . Smokeless tobacco: Never Used  Substance and Sexual Activity  . Alcohol use: No  . Drug use: No  . Sexual activity: Never     History reviewed. No pertinent family history.   Immunization History  Administered Date(s) Administered  . Influenza, High  Dose Seasonal PF 12/21/2014, 01/07/2016, 01/17/2018  . Pneumococcal Conjugate-13 01/07/2016  . Pneumococcal Polysaccharide-23 08/25/2010  . Td 11/16/2006  . Tdap 09/25/2016     Review of systems: Positive Findings in bold print.  Constitutional:  chills, fatigue, fever, sweats, weight change Communication: Optometrist, sign Ecologist, hand writing, iPad/Android device Head: headaches, head injury Eyes: changes in vision, eye pain, glaucoma, cataracts, macular degeneration, diplopia, glare,  light sensitivity, eyeglasses or contacts, blindness Ears nose mouth throat: Hard of hearing, ringing in ears, deaf, sign language,  vertigo,   nosebleeds,  rhinitis,  cold sores, snoring, swollen glands Cardiovascular: HTN, edema, arrhythmia, pacemaker in place, defibrillator in place,  chest pain/tightness, chronic anticoagulation, blood clot, heart failure Peripheral Vascular: leg cramps, varicose veins, blood clots, lymphedema Respiratory:  difficulty breathing, denies congestion, SOB, wheezing, cough, emphysema Gastrointestinal: change in appetite or weight, abdominal pain, constipation, diarrhea, nausea, vomiting, vomiting blood, change in bowel habits, abdominal pain, jaundice, rectal bleeding, hemorrhoids, Genitourinary:  nocturia,  pain on urination,  blood in urine, Foley catheter, urinary urgency Musculoskeletal: uses mobility aid,  cramping, stiff joints, painful joints, decreased joint motion, fractures, OA, gout Skin: +changes in toenails, color change, dryness, itching, mole changes,  rash  Neurological: headaches, numbness in feet, paresthesias in feet, burning in feet, fainting,  seizures, change in speech. denies headaches, memory problems/poor historian, cerebral palsy, weakness, paralysis Endocrine: diabetes, hypothyroidism, hyperthyroidism,  goiter,  dry mouth, flushing, heat intolerance,  cold intolerance,  excessive thirst, denies polyuria,  nocturia Hematological:  easy  bleeding, excessive bleeding, easy bruising, enlarged lymph nodes, on long term blood thinner, history of past transusions Allergy/immunological:  hives, eczema, frequent infections, multiple drug allergies, seasonal allergies, transplant recipient Psychiatric:  anxiety, depression, mood disorder, suicidal ideations, hallucinations   Objective: Vascular Examination: Capillary refill time immediate x 10 digits Dorsalis pedis and posterior tibial pulses present b/l No digital hair x 10 digits Skin temperature gradient WNL b/l +2 Edema b/l LE  Dermatological Examination: Skin with normal turgor, texture and tone b/l  Toenails 1-5 b/l discolored, thick, dystrophic with subungual debris and pain with palpation to nailbeds due to thickness of nails.  Hyperkeratotic lesions distal aspect left 2nd toe and right 3rd toe. No erythema, no edema, no drainage.  Musculoskeletal: Muscle strength 5/5 to all LE muscle groups  HAV with bunion deformity b/l  Hammertoes 2-5 b/l  Overlapping 2nd toe b/l  Neurological: Sensation intact with 10 gram monofilament Vibratory sensation intact.  Assessment: 1. Painful onychomycosis toenails 1-5 b/l  2. Corns left 2nd toe and right 3rd toe 3. Edema   Plan: 1. Discussed diagnoses and treatment options.  Literature dispensed on today. 2. Toenails 1-5 b/l were debrided in length and girth without iatrogenic bleeding. 3. Corns debrided left 2nd toe and right 3rd toe 4. Patient to continue soft, supportive shoe gear 5. Patient to report any pedal injuries to medical professional immediately. 6. Follow up 3 months. Patient/POA to call should there be a concern in the interim.

## 2018-04-14 ENCOUNTER — Ambulatory Visit (INDEPENDENT_AMBULATORY_CARE_PROVIDER_SITE_OTHER): Payer: Medicare HMO | Admitting: *Deleted

## 2018-04-14 DIAGNOSIS — E538 Deficiency of other specified B group vitamins: Secondary | ICD-10-CM | POA: Diagnosis not present

## 2018-04-14 MED ORDER — CYANOCOBALAMIN 1000 MCG/ML IJ SOLN
1000.0000 ug | Freq: Once | INTRAMUSCULAR | Status: AC
  Administered 2018-04-14: 1000 ug via INTRAMUSCULAR

## 2018-04-14 NOTE — Progress Notes (Signed)
Per orders of Dr. Banks, injection of Vit B12 given by GREEN, ASHTYN M. Patient tolerated injection well.  

## 2018-05-03 NOTE — Progress Notes (Signed)
Please sign off on DOS 04/14/18

## 2018-05-11 ENCOUNTER — Ambulatory Visit: Payer: Medicare HMO | Admitting: Family Medicine

## 2018-05-11 ENCOUNTER — Ambulatory Visit: Payer: Self-pay | Admitting: *Deleted

## 2018-05-11 NOTE — Telephone Encounter (Signed)
Pt called with complaints of left side pain which radiates to her upper back; her symptoms started the evening of 05/10/2018; recommendations made per nurse triage protocol; pt offered and accepted appointment with Dr Grier Mitts, [facility], 05/11/2018 at 1030; she verbalized understanding; attempted to contact pt's daughter to notify her of appointment but unable to leave message on voicemail, pre-recorded message states  "the customer can not accept messages at this time"; will route to office for notification.  Reason for Disposition . MODERATE pain (e.g., interferes with normal activities or awakens from sleep)  Answer Assessment - Initial Assessment Questions 1. ONSET: "When did the pain begin?"      Evening of 05/10/2018 2. LOCATION: "Where does it hurt?" (upper, mid or lower back)     Left side to upper back 3. SEVERITY: "How bad is the pain?"  (e.g., Scale 1-10; mild, moderate, or severe)   - MILD (1-3): doesn't interfere with normal activities    - MODERATE (4-7): interferes with normal activities or awakens from sleep    - SEVERE (8-10): excruciating pain, unable to do any normal activities      "bad"  4. PATTERN: "Is the pain constant?" (e.g., yes, no; constant, intermittent)      constant 5. RADIATION: "Does the pain shoot into your legs or elsewhere?" no 6. CAUSE:  "What do you think is causing the back pain?"      no 7. BACK OVERUSE:  "Any recent lifting of heavy objects, strenuous work or exercise?"     no 8. MEDICATIONS: "What have you taken so far for the pain?" (e.g., nothing, acetaminophen, NSAIDS)     Took 2 baby  aspirins 9. NEUROLOGIC SYMPTOMS: "Do you have any weakness, numbness, or problems with bowel/bladder control?"     no 10. OTHER SYMPTOMS: "Do you have any other symptoms?" (e.g., fever, abdominal pain, burning with urination, blood in urine)       no 11. PREGNANCY: "Is there any chance you are pregnant?" (e.g., yes, no; LMP)       no  Answer Assessment  - Initial Assessment Questions 1. LOCATION: "Where does it hurt?" (e.g., left, right)     Left side radiating to left upper back 2. ONSET: "When did the pain start?"     Evening of 05/10/2018 3. SEVERITY: "How bad is the pain?" (e.g., Scale 1-10; mild, moderate, or severe)   - MILD (1-3): doesn't interfere with normal activities    - MODERATE (4-7): interferes with normal activities or awakens from sleep    - SEVERE (8-10): excruciating pain and patient unable to do normal activities (stays in bed)       moderate 4. PATTERN: "Does the pain come and go, or is it constant?"      constant 5. CAUSE: "What do you think is causing the pain?"     Not sure 6. OTHER SYMPTOMS:  "Do you have any other symptoms?" (e.g., fever, abdominal pain, vomiting, leg weakness, burning with urination, blood in urine)     no 7. PREGNANCY:  "Is there any chance you are pregnant?" "When was your last menstrual period?"     no  Protocols used: FLANK PAIN-A-AH, BACK PAIN-A-AH

## 2018-05-11 NOTE — Telephone Encounter (Signed)
Noted  

## 2018-05-12 ENCOUNTER — Ambulatory Visit: Payer: Medicare HMO | Admitting: Podiatry

## 2018-05-13 ENCOUNTER — Ambulatory Visit: Payer: Medicare HMO | Admitting: Family Medicine

## 2018-05-20 ENCOUNTER — Emergency Department (HOSPITAL_COMMUNITY): Payer: Medicare HMO

## 2018-05-20 ENCOUNTER — Other Ambulatory Visit: Payer: Self-pay

## 2018-05-20 ENCOUNTER — Encounter (HOSPITAL_COMMUNITY): Payer: Self-pay | Admitting: Emergency Medicine

## 2018-05-20 ENCOUNTER — Observation Stay (HOSPITAL_COMMUNITY)
Admission: EM | Admit: 2018-05-20 | Discharge: 2018-05-24 | Disposition: A | Discharge status: Skilled Nursing Facility | Payer: Medicare HMO | Attending: Internal Medicine | Admitting: Internal Medicine

## 2018-05-20 DIAGNOSIS — I5030 Unspecified diastolic (congestive) heart failure: Secondary | ICD-10-CM | POA: Diagnosis present

## 2018-05-20 DIAGNOSIS — E46 Unspecified protein-calorie malnutrition: Secondary | ICD-10-CM | POA: Diagnosis present

## 2018-05-20 DIAGNOSIS — W19XXXA Unspecified fall, initial encounter: Secondary | ICD-10-CM | POA: Diagnosis not present

## 2018-05-20 DIAGNOSIS — R410 Disorientation, unspecified: Secondary | ICD-10-CM

## 2018-05-20 DIAGNOSIS — F05 Delirium due to known physiological condition: Secondary | ICD-10-CM

## 2018-05-20 DIAGNOSIS — F0390 Unspecified dementia without behavioral disturbance: Secondary | ICD-10-CM | POA: Diagnosis present

## 2018-05-20 DIAGNOSIS — R748 Abnormal levels of other serum enzymes: Secondary | ICD-10-CM | POA: Diagnosis not present

## 2018-05-20 DIAGNOSIS — I110 Hypertensive heart disease with heart failure: Secondary | ICD-10-CM | POA: Insufficient documentation

## 2018-05-20 DIAGNOSIS — R55 Syncope and collapse: Secondary | ICD-10-CM | POA: Diagnosis not present

## 2018-05-20 DIAGNOSIS — M5489 Other dorsalgia: Secondary | ICD-10-CM | POA: Diagnosis not present

## 2018-05-20 DIAGNOSIS — G309 Alzheimer's disease, unspecified: Secondary | ICD-10-CM | POA: Diagnosis not present

## 2018-05-20 DIAGNOSIS — R262 Difficulty in walking, not elsewhere classified: Secondary | ICD-10-CM

## 2018-05-20 DIAGNOSIS — R17 Unspecified jaundice: Secondary | ICD-10-CM | POA: Diagnosis not present

## 2018-05-20 DIAGNOSIS — S0990XA Unspecified injury of head, initial encounter: Secondary | ICD-10-CM | POA: Diagnosis not present

## 2018-05-20 DIAGNOSIS — E440 Moderate protein-calorie malnutrition: Secondary | ICD-10-CM | POA: Diagnosis not present

## 2018-05-20 DIAGNOSIS — M6281 Muscle weakness (generalized): Secondary | ICD-10-CM | POA: Insufficient documentation

## 2018-05-20 DIAGNOSIS — I10 Essential (primary) hypertension: Secondary | ICD-10-CM | POA: Diagnosis not present

## 2018-05-20 DIAGNOSIS — I5032 Chronic diastolic (congestive) heart failure: Secondary | ICD-10-CM | POA: Diagnosis present

## 2018-05-20 DIAGNOSIS — E538 Deficiency of other specified B group vitamins: Secondary | ICD-10-CM | POA: Diagnosis present

## 2018-05-20 DIAGNOSIS — R2689 Other abnormalities of gait and mobility: Secondary | ICD-10-CM | POA: Insufficient documentation

## 2018-05-20 DIAGNOSIS — M6282 Rhabdomyolysis: Secondary | ICD-10-CM | POA: Diagnosis not present

## 2018-05-20 DIAGNOSIS — R52 Pain, unspecified: Secondary | ICD-10-CM | POA: Diagnosis not present

## 2018-05-20 DIAGNOSIS — R2681 Unsteadiness on feet: Secondary | ICD-10-CM | POA: Diagnosis not present

## 2018-05-20 DIAGNOSIS — R9401 Abnormal electroencephalogram [EEG]: Secondary | ICD-10-CM | POA: Diagnosis not present

## 2018-05-20 DIAGNOSIS — E43 Unspecified severe protein-calorie malnutrition: Secondary | ICD-10-CM | POA: Diagnosis present

## 2018-05-20 DIAGNOSIS — R531 Weakness: Secondary | ICD-10-CM | POA: Diagnosis not present

## 2018-05-20 DIAGNOSIS — R600 Localized edema: Secondary | ICD-10-CM | POA: Diagnosis not present

## 2018-05-20 LAB — CBC WITH DIFFERENTIAL/PLATELET
Abs Immature Granulocytes: 0.03 10*3/uL (ref 0.00–0.07)
BASOS ABS: 0 10*3/uL (ref 0.0–0.1)
Basophils Relative: 0 %
Eosinophils Absolute: 0 10*3/uL (ref 0.0–0.5)
Eosinophils Relative: 0 %
HCT: 44.7 % (ref 36.0–46.0)
Hemoglobin: 13.5 g/dL (ref 12.0–15.0)
Immature Granulocytes: 0 %
LYMPHS ABS: 2.1 10*3/uL (ref 0.7–4.0)
Lymphocytes Relative: 23 %
MCH: 24.3 pg — ABNORMAL LOW (ref 26.0–34.0)
MCHC: 30.2 g/dL (ref 30.0–36.0)
MCV: 80.4 fL (ref 80.0–100.0)
Monocytes Absolute: 0.6 10*3/uL (ref 0.1–1.0)
Monocytes Relative: 7 %
NEUTROS ABS: 6.1 10*3/uL (ref 1.7–7.7)
Neutrophils Relative %: 70 %
Platelets: 273 10*3/uL (ref 150–400)
RBC: 5.56 MIL/uL — ABNORMAL HIGH (ref 3.87–5.11)
RDW: 14.8 % (ref 11.5–15.5)
WBC: 8.8 10*3/uL (ref 4.0–10.5)
nRBC: 0 % (ref 0.0–0.2)

## 2018-05-20 LAB — URINALYSIS, ROUTINE W REFLEX MICROSCOPIC
Bilirubin Urine: NEGATIVE
Glucose, UA: NEGATIVE mg/dL
Hgb urine dipstick: NEGATIVE
Ketones, ur: NEGATIVE mg/dL
Leukocytes,Ua: NEGATIVE
Nitrite: NEGATIVE
Protein, ur: NEGATIVE mg/dL
Specific Gravity, Urine: 1.006 (ref 1.005–1.030)
pH: 6 (ref 5.0–8.0)

## 2018-05-20 LAB — COMPREHENSIVE METABOLIC PANEL
ALK PHOS: 64 U/L (ref 38–126)
ALT: 26 U/L (ref 0–44)
AST: 41 U/L (ref 15–41)
Albumin: 3.4 g/dL — ABNORMAL LOW (ref 3.5–5.0)
Anion gap: 10 (ref 5–15)
BUN: 11 mg/dL (ref 8–23)
CO2: 25 mmol/L (ref 22–32)
Calcium: 9.2 mg/dL (ref 8.9–10.3)
Chloride: 101 mmol/L (ref 98–111)
Creatinine, Ser: 0.76 mg/dL (ref 0.44–1.00)
GFR calc Af Amer: >60 mL/min (ref >60)
GFR calc non Af Amer: >60 mL/min (ref >60)
Glucose, Bld: 92 mg/dL (ref 70–99)
Potassium: 3.6 mmol/L (ref 3.5–5.1)
Sodium: 136 mmol/L (ref 135–145)
Total Bilirubin: 2.2 mg/dL — ABNORMAL HIGH (ref 0.3–1.2)
Total Protein: 6.8 g/dL (ref 6.5–8.1)

## 2018-05-20 LAB — CK: CK TOTAL: 922 U/L — AB (ref 38–234)

## 2018-05-20 LAB — TROPONIN I: Troponin I: <0.03 ng/mL (ref <0.03)

## 2018-05-20 LAB — ETHANOL: Alcohol, Ethyl (B): <10 mg/dL (ref <10)

## 2018-05-20 LAB — LACTIC ACID, PLASMA: Lactic Acid, Venous: 1 mmol/L (ref 0.5–1.9)

## 2018-05-20 LAB — PROTIME-INR
INR: 1 (ref 0.8–1.2)
Prothrombin Time: 13.1 seconds (ref 11.4–15.2)

## 2018-05-20 LAB — LIPASE, BLOOD: Lipase: 32 U/L (ref 11–51)

## 2018-05-20 IMAGING — CT CT CERVICAL SPINE WITHOUT CONTRAST
5 of 8 series · 12 of 33 positions shown, 13 images · non-contrast
Comparison: Knee 318 CT head and cervical spine

CLINICAL DATA: Head trauma. Patient found sitting on floor.
Uncertain whether the patient fell. History of dementia and back
pain yesterday.

EXAM:
CT HEAD WITHOUT CONTRAST
CT CERVICAL SPINE WITHOUT CONTRAST
TECHNIQUE: Multidetector CT imaging of the head and cervical spine was
performed following the standard protocol without intravenous
contrast. Multiplanar CT image reconstructions of the cervical spine
were also generated.

[Series 5: head bone · axial · 0.43mm/px · z∈[-47,+7]mm · 2 of 81 slices shown]
[im 27/81  bone]
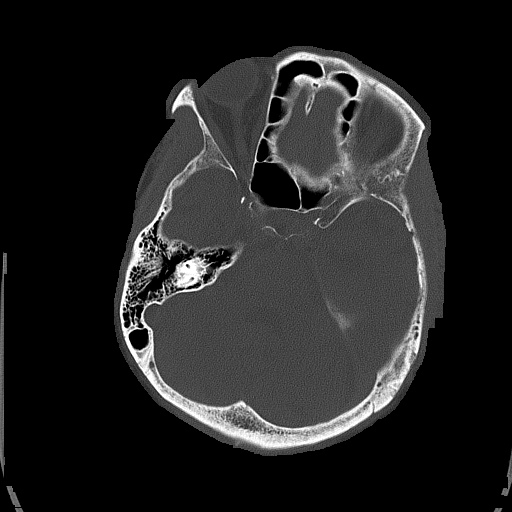
[im 54/81  bone]
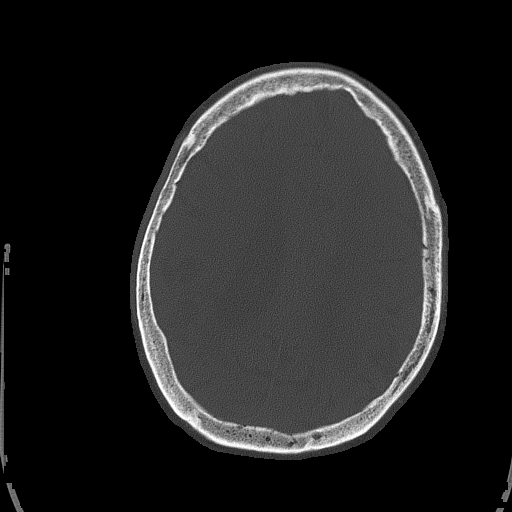

[Series 8: c_spine 2.0 st · axial · 0.29mm/px · z∈[-180,-114]mm · 2 of 99 slices shown, 3 images]
[im 33/99  soft-tissue]
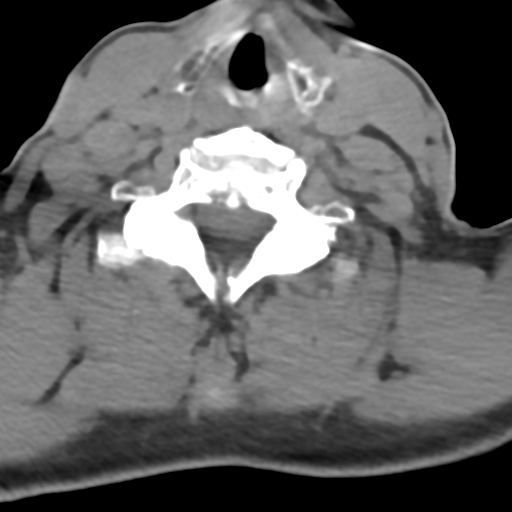
[im 33/99  bone]
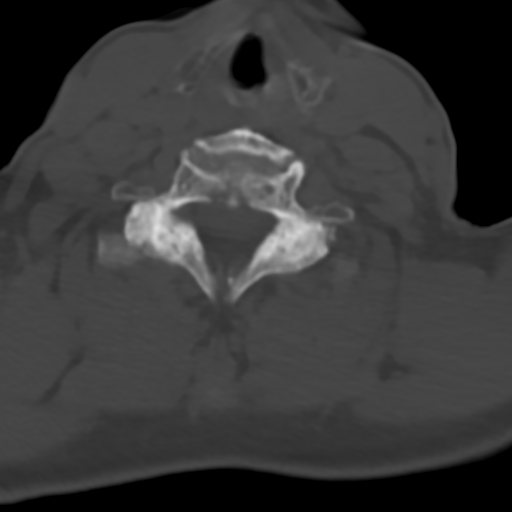
[im 66/99  bone]
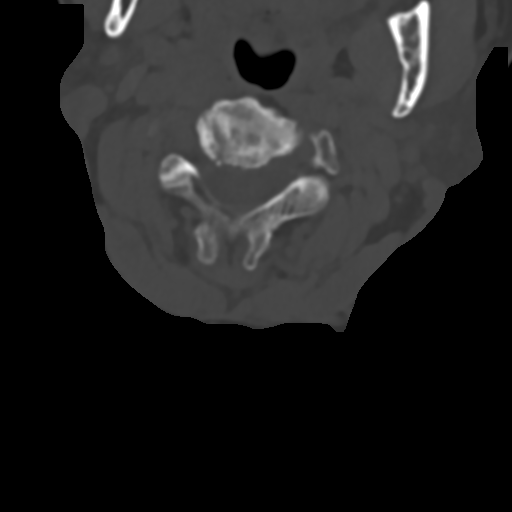

[Series 10: c_spine 2.0 sag bone · sagittal · 0.32mm/px · 5 of 67 slices shown]
[im 12/67  bone]
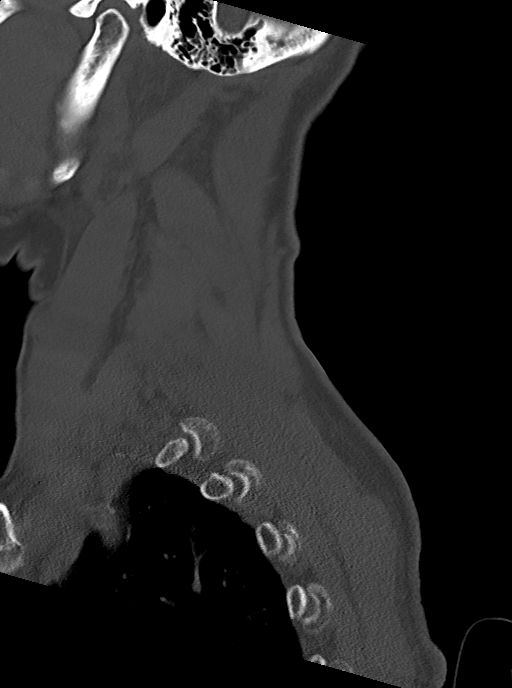
[im 23/67  bone]
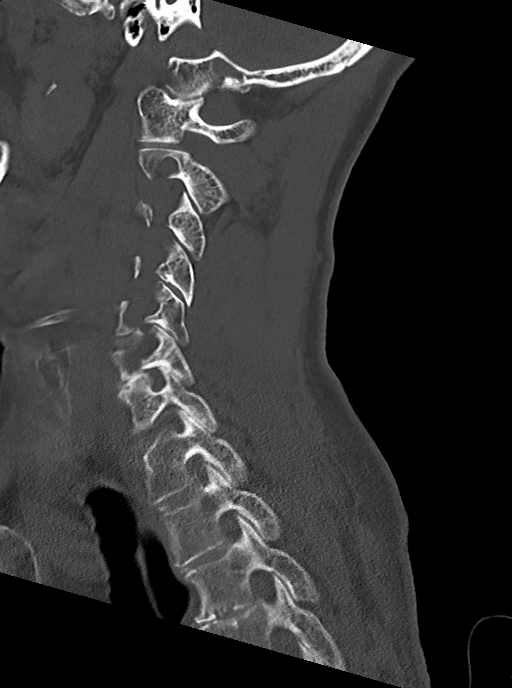
[im 34/67  bone]
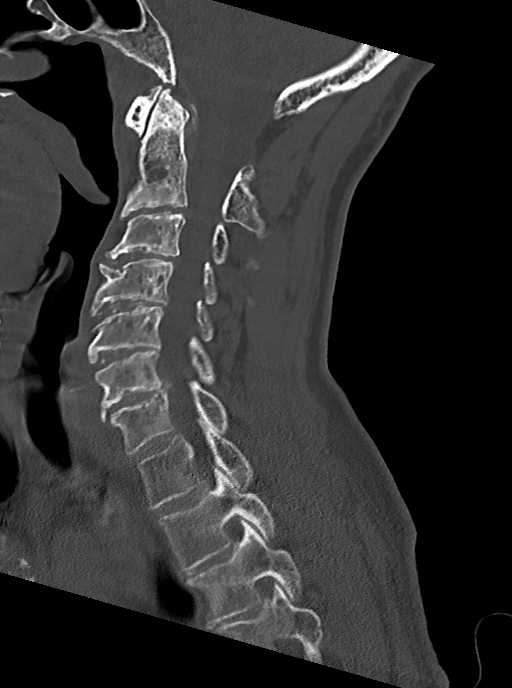
[im 45/67  bone]
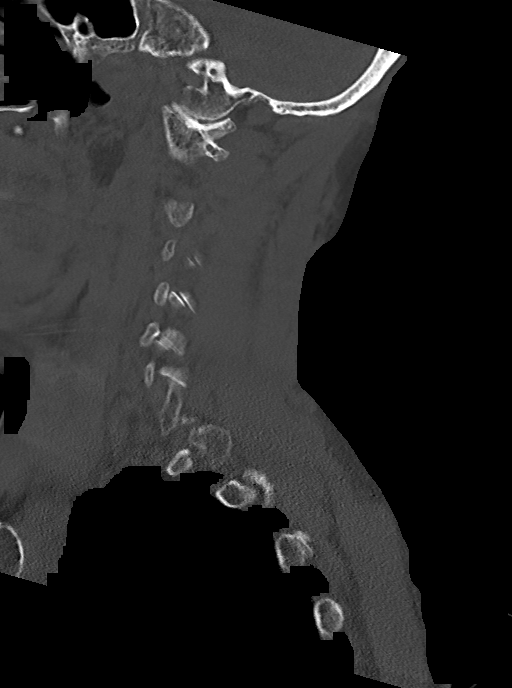
[im 56/67  bone]
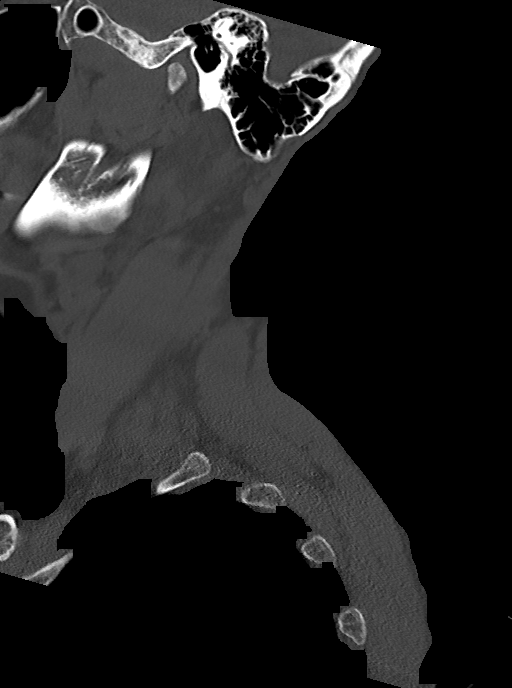

[Series 11: c_spine 2.0 cor bone · coronal · 0.29mm/px · 1 of 64 slices shown]
[im 32/64  bone]
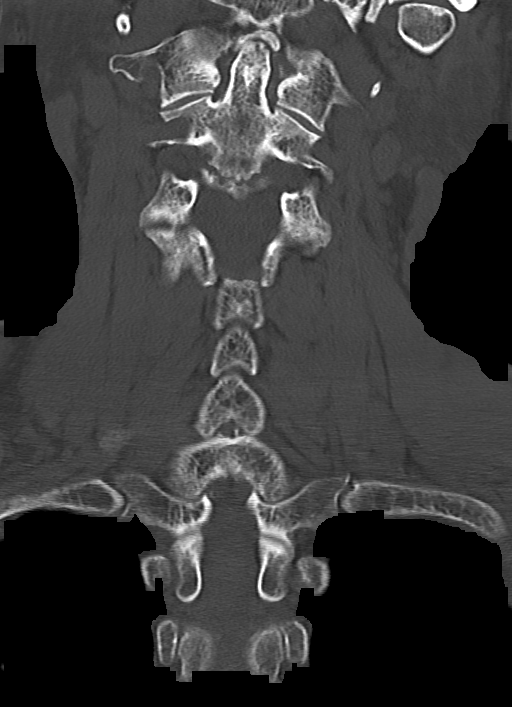

[Series 12: c_spine 2.0 orthogonals · axial · 0.21mm/px · z∈[-205,-138]mm · 2 of 99 slices shown]
[im 33/99  bone]
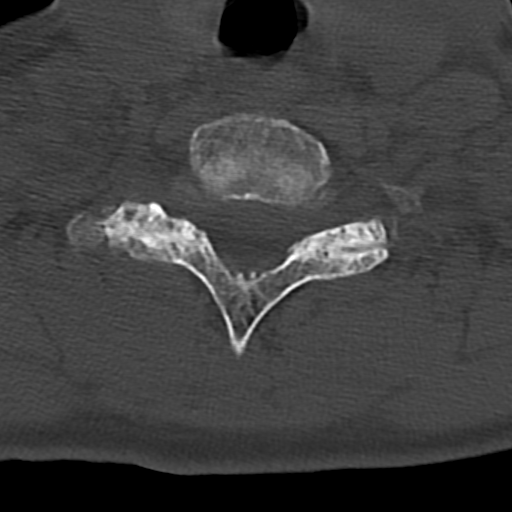
[im 66/99  bone]
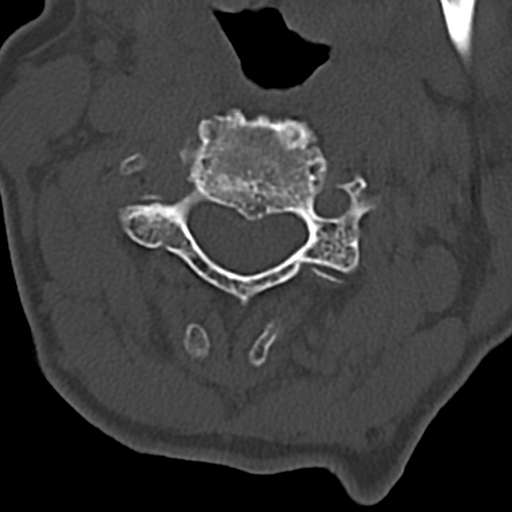

[12 of 33 positions shown; findings below may reference images not displayed]

FINDINGS: CT HEAD FINDINGS

BRAIN: There is sulcal and ventricular prominence consistent with
superficial and central atrophy, stable in appearance. No
intraparenchymal hemorrhage, mass effect nor midline shift. Moderate
to marked degree of periventricular and deep white matter
hypodensities consistent with chronic small vessel ischemic disease
are identified. No acute large vascular territory infarcts. No
abnormal extra-axial fluid collections. Basal cisterns are not
effaced and midline. Brainstem and cerebellum demonstrate atrophy
without acute abnormality.

VASCULAR: No hyperdense vessels or unexpected calcifications.
Atherosclerosis at the skull base.

SKULL: No skull fracture. No significant scalp soft tissue swelling.

SINUSES/ORBITS: The mastoid air-cells are clear. The included
paranasal sinuses are well-aerated.The included ocular globes and
orbital contents are non-suspicious.

OTHER: None.

CT CERVICAL SPINE FINDINGS

Alignment: Maintained cervical lordosis. Intact C1-C2 relationship.
Intact craniocervical relationship.

Skull base and vertebrae: Intact skull base. Osteoarthritis of the
atlantodental interval. Maintained facet joints without jumped or
perched facets. Lower cervical facet arthropathy with joint space
narrowing and sclerosis.

Soft tissues and spinal canal: No prevertebral fluid or swelling. No
visible canal hematoma.

Disc levels: Moderate disc flattening C2 through C6 with osteophytes
and uncovertebral joint osteoarthritis. Bilateral uncinate spurring
is noted off uncovertebral joints. No significant central canal
stenosis. Minimal neural foraminal encroachment from uncinate spurs
bilaterally.

Upper chest: Negative.

Other: None
IMPRESSION: CT head:

1. Atrophy with chronic moderate to marked small vessel ischemic
disease.
2. No acute intracranial abnormality.

CT cervical spine:

1. Cervical spondylosis and facet arthropathy.
2. No acute cervical spine fracture or posttraumatic listhesis.

## 2018-05-20 IMAGING — DX PORTABLE CHEST - 1 VIEW
1 series · 1 of 1 positions shown · non-contrast
Comparison: Chest radiographs [DATE] and CT [DATE]

CLINICAL DATA: Weakness and fall today.

EXAM:
PORTABLE CHEST 1 VIEW

[chest ap]
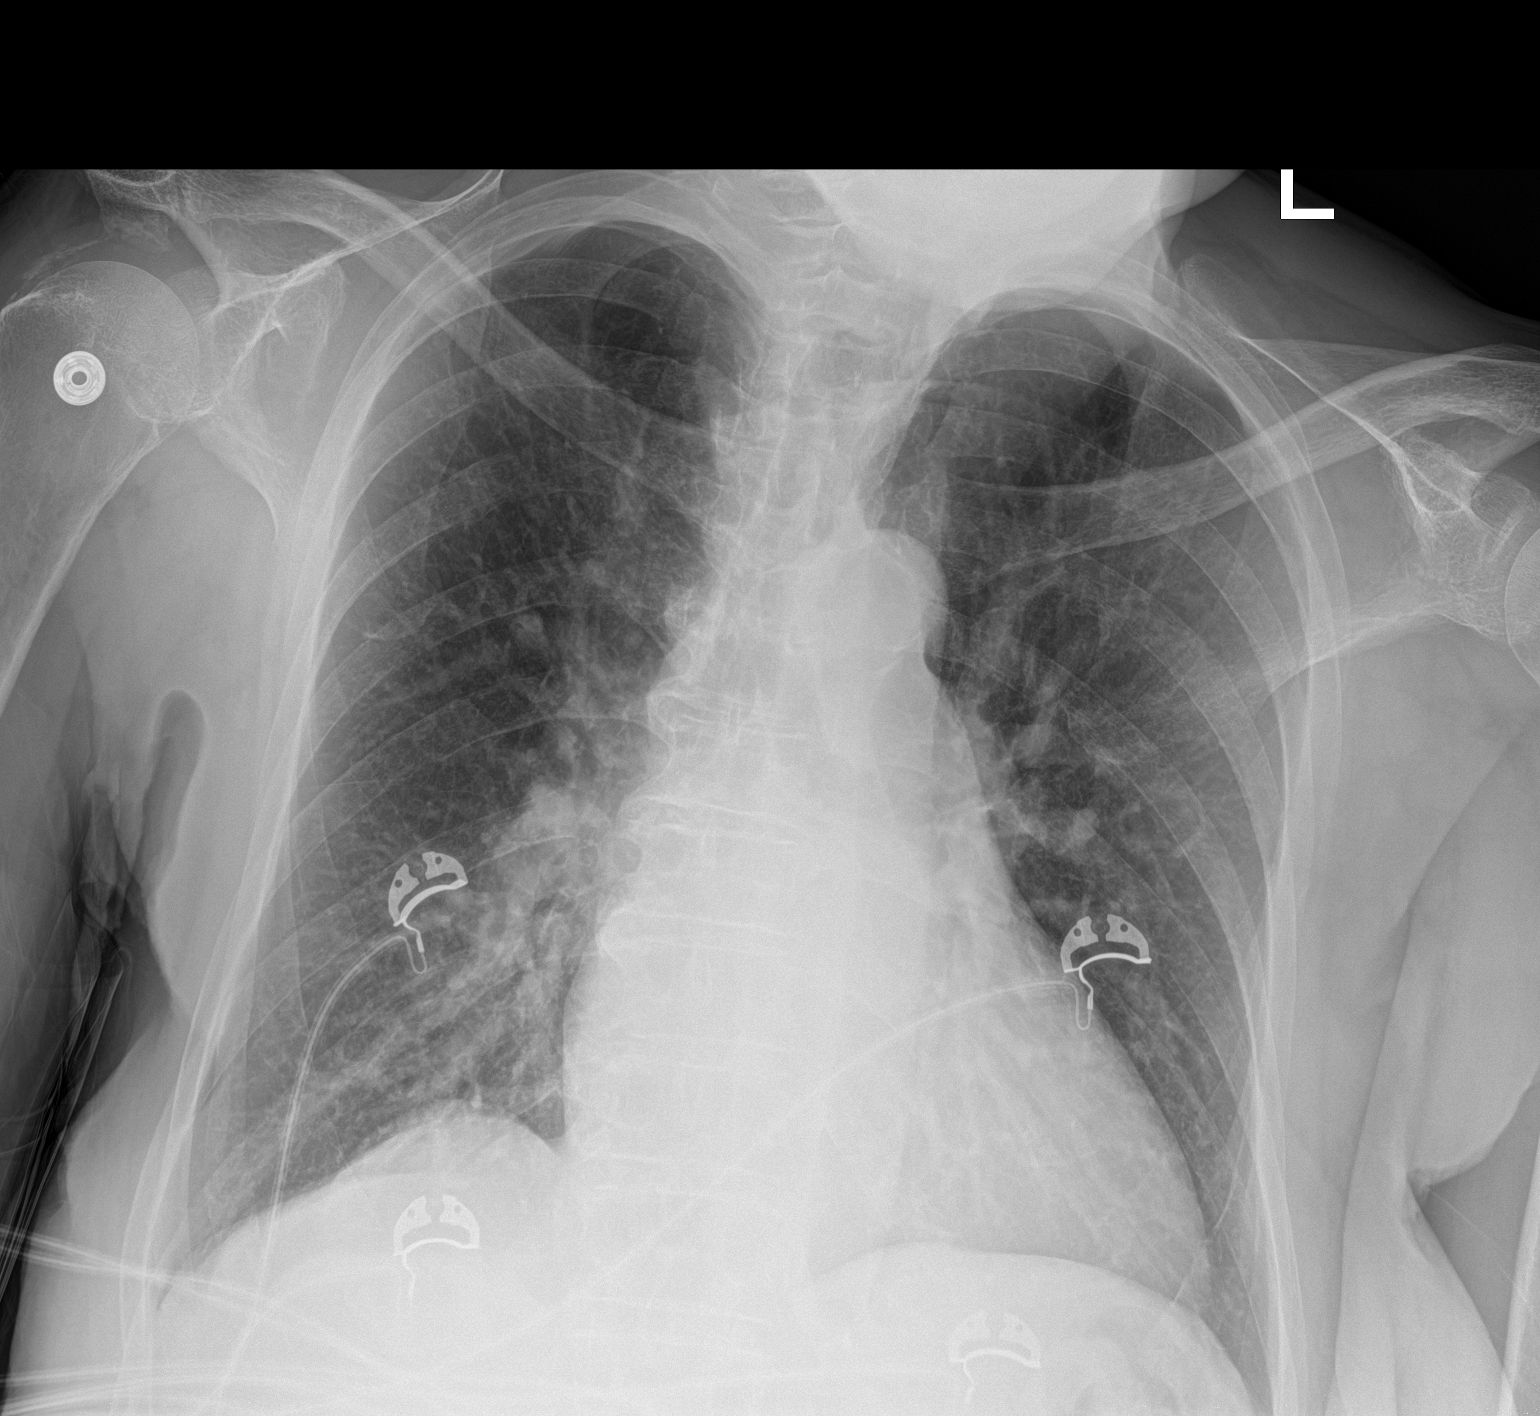

[1 of 1 positions shown; findings below may reference images not displayed]

FINDINGS: The cardiac silhouette is mildly enlarged. Aortic atherosclerosis is
noted. Prominence of the central pulmonary arteries is similar to
the prior radiographs. No airspace consolidation, edema, sizable
pleural effusion, or pneumothorax is identified although the left
lateral costophrenic angle was incompletely imaged. Curvilinear
calcification adjacent to the right humeral head suggests calcific
rotator cuff tendinopathy.
IMPRESSION: No active disease.

## 2018-05-20 MED ORDER — ACETAMINOPHEN 325 MG PO TABS
650.0000 mg | ORAL_TABLET | Freq: Four times a day (QID) | ORAL | Status: DC | PRN
  Administered 2018-05-21: 650 mg via ORAL
  Filled 2018-05-20: qty 2

## 2018-05-20 MED ORDER — ACETAMINOPHEN 650 MG RE SUPP: 650.0000 mg | Freq: Four times a day (QID) | RECTAL | Status: DC | PRN

## 2018-05-20 MED ORDER — SODIUM CHLORIDE 0.9 % IV BOLUS
1000.0000 mL | Freq: Once | INTRAVENOUS | Status: AC
  Administered 2018-05-20: 1000 mL via INTRAVENOUS

## 2018-05-20 MED ORDER — SODIUM CHLORIDE 0.9 % IV SOLN
INTRAVENOUS | Status: AC
  Administered 2018-05-20 – 2018-05-21 (×2): via INTRAVENOUS

## 2018-05-20 MED ORDER — HEPARIN SODIUM (PORCINE) 5000 UNIT/ML IJ SOLN
5000.0000 [IU] | Freq: Three times a day (TID) | INTRAMUSCULAR | Status: DC
  Administered 2018-05-20 – 2018-05-23 (×8): 5000 [IU] via SUBCUTANEOUS
  Filled 2018-05-20 (×8): qty 1

## 2018-05-20 NOTE — ED Notes (Signed)
The pt has redness and swelling to both lower legs the lt is more red and swollen than the rt  X/o pain in her lt leg with touch

## 2018-05-20 NOTE — H&P (Signed)
History and Physical    Nicole Blackwell QVZ:[id number] DOB: August 26, 1935 DOA: 05/20/2018  PCP: Billie Ruddy, MD Patient coming from: Home  Chief Complaint: Fall  HPI: Nicole Blackwell is a 83 y.o. female with medical history significant of back pain, osteoporosis presenting to the hospital for evaluation of a fall.  Patient not sure why she is here and does not know why she was on the floor.  Does report feeling lightheaded.  Denies any other complaints including headaches, neck pain, rhinorrhea, sore throat, chest pain, shortness of breath, cough, abdominal pain, nausea, vomiting, diarrhea, dysuria, urinary frequency/urgency.  Per ED provider conversation with the patient's daughter: "The patient has dementia but lives independently.  She is able to walk independently in her home to obtain food and take care of basic needs.  The patient goes out with family members to do her shopping.  She does not go outside the home independently.  Her daughter reports that someone goes in daily to check on her.  Patient's daughter reports that yesterday she spent much of the afternoon with her.  She reports she left her at about sometime between 2 and 4 in the afternoon.  Her daughter reports that she did complain of some back pain once which is not typical for the patient.  She did not seem to be having any difficulty moving or walking.  She did not specify or give any additional details regarding discomfort.  Patient's daughter reports that when she left her, she was up making some food.  This afternoon patient was not returning calls so her ex-husband went to check on her.  Had to use a key to open the door.  He found her sitting in the floor in the hallway.  The patient's daughter reports they assumed she had fallen although there was no conclusive observations.  She reports that the patient required help to get up and move in the house.  They report that she could not walk independently due to weakness.  They however  do not localize weakness as to be 1 extremity or the other."   Review of Systems: As per HPI otherwise 10 point review of systems negative.  Past Medical History:  Diagnosis Date   BACK PAIN    OSTEOPOROSIS    PLANTAR FASCIITIS    Positive PPD    SHINGLES    SYMPTOMATIC MENOPAUSAL/FEMALE CLIMACTERIC STATES    Syncope and collapse 12/13/2014   Uterine cancer (HCC)     Past Surgical History:  Procedure Laterality Date   CATARACT EXTRACTION, BILATERAL Bilateral    DILATION AND CURETTAGE OF UTERUS  1960's X 2   "I lost 2 children to miscarriage"   LAPAROSCOPIC CHOLECYSTECTOMY     SIGMOIDOSCOPY     TONSILLECTOMY     VAGINAL HYSTERECTOMY       reports that she has never smoked. She has never used smokeless tobacco. She reports that she does not drink alcohol or use drugs.  Allergies  Allergen Reactions   Penicillin G Benzathine Swelling   Penicillins Swelling    History reviewed. No pertinent family history.  Prior to Admission medications   Medication Sig Start Date End Date Taking? Authorizing Provider  aspirin 81 MG tablet Take 81 mg by mouth at bedtime.     [provider]  furosemide (LASIX) 20 MG tablet Take 1 tablet (20 mg total) by mouth daily for 3 days. 01/17/18 01/20/18  Billie Ruddy, MD    Physical Exam: Vitals:  05/20/18 1800 05/20/18 1845 05/20/18 1900 05/20/18 1930  BP: (!) 153/72 133/62 (!) 161/85 (!) 177/94  Pulse: 90 86    Resp: 13     Temp:      TempSrc:      SpO2: 98% 100%      Physical Exam  Constitutional: She appears well-developed and well-nourished. No distress.  HENT:  Head: Normocephalic.  Mouth/Throat: Oropharynx is clear and moist.  Eyes: Right eye exhibits no discharge. Left eye exhibits no discharge.  Neck: Neck supple.  Cardiovascular: Normal rate, regular rhythm and intact distal pulses.  Pulmonary/Chest: Effort normal and breath sounds normal. No respiratory distress. She has no wheezes. She has no  rales.  Abdominal: Soft. Bowel sounds are normal. She exhibits no distension. There is no abdominal tenderness. There is no guarding.  Musculoskeletal:        General: Edema present.     Comments: Bilateral lower extremity pitting edema, warmth to touch, and mild erythema.  Left lower extremity appears larger in size compared to the right lower extremity.  Neurological:  Awake and alert Oriented to person and place Following commands appropriately Speech fluent, tongue midline, no facial droop. Strength 5 out of 5 in bilateral upper and lower extremities.   Sensation to light touch intact throughout.  Skin: Skin is warm and dry. She is not diaphoretic.     Labs on Admission: I have personally reviewed following labs and imaging studies  CBC: Recent Labs  Lab 05/20/18 1513  WBC 8.8  NEUTROABS 6.1  HGB 13.5  HCT 44.7  MCV 80.4  PLT 353   Basic Metabolic Panel: Recent Labs  Lab 05/20/18 1513  NA 136  K 3.6  CL 101  CO2 25  GLUCOSE 92  BUN 11  CREATININE 0.76  CALCIUM 9.2   GFR: CrCl cannot be calculated (Unknown ideal weight.). Liver Function Tests: Recent Labs  Lab 05/20/18 1513  AST 41  ALT 26  ALKPHOS 64  BILITOT 2.2*  PROT 6.8  ALBUMIN 3.4*   Recent Labs  Lab 05/20/18 1513  LIPASE 32   No results for input(s): AMMONIA in the last 168 hours. Coagulation Profile: Recent Labs  Lab 05/20/18 1513  INR 1.0   Cardiac Enzymes: Recent Labs  Lab 05/20/18 1513  CKTOTAL 922*  TROPONINI <0.03   BNP (last 3 results) Recent Labs    12/15/17 1225  PROBNP 52.0   HbA1C: No results for input(s): HGBA1C in the last 72 hours. CBG: No results for input(s): GLUCAP in the last 168 hours. Lipid Profile: No results for input(s): CHOL, HDL, LDLCALC, TRIG, CHOLHDL, LDLDIRECT in the last 72 hours. Thyroid Function Tests: No results for input(s): TSH, T4TOTAL, FREET4, T3FREE, THYROIDAB in the last 72 hours. Anemia Panel: No results for input(s): VITAMINB12,  FOLATE, FERRITIN, TIBC, IRON, RETICCTPCT in the last 72 hours. Urine analysis:    Component Value Date/Time   COLORURINE STRAW (A) 05/20/2018 1455   APPEARANCEUR CLEAR 05/20/2018 1455   LABSPEC 1.006 05/20/2018 1455   PHURINE 6.0 05/20/2018 1455   GLUCOSEU NEGATIVE 05/20/2018 1455   HGBUR NEGATIVE 05/20/2018 1455   BILIRUBINUR NEGATIVE 05/20/2018 1455   BILIRUBINUR n 01/03/2016 1048   KETONESUR NEGATIVE 05/20/2018 1455   PROTEINUR NEGATIVE 05/20/2018 1455   UROBILINOGEN 1.0 01/03/2016 1048   UROBILINOGEN 1.0 12/13/2014 1133   NITRITE NEGATIVE 05/20/2018 1455   LEUKOCYTESUR NEGATIVE 05/20/2018 1455    Radiological Exams on Admission: Ct Head Wo Contrast  Result Date: 05/20/2018 CLINICAL DATA:  Head  trauma. Patient found sitting on floor. Uncertain whether the patient fell. History of dementia and back pain yesterday. EXAM: CT HEAD WITHOUT CONTRAST CT CERVICAL SPINE WITHOUT CONTRAST TECHNIQUE: Multidetector CT imaging of the head and cervical spine was performed following the standard protocol without intravenous contrast. Multiplanar CT image reconstructions of the cervical spine were also generated. COMPARISON:  Knee 318 CT head and cervical spine FINDINGS: CT HEAD FINDINGS BRAIN: There is sulcal and ventricular prominence consistent with superficial and central atrophy, stable in appearance. No intraparenchymal hemorrhage, mass effect nor midline shift. Moderate to marked degree of periventricular and deep white matter hypodensities consistent with chronic small vessel ischemic disease are identified. No acute large vascular territory infarcts. No abnormal extra-axial fluid collections. Basal cisterns are not effaced and midline. Brainstem and cerebellum demonstrate atrophy without acute abnormality. VASCULAR: No hyperdense vessels or unexpected calcifications. Atherosclerosis at the skull base. SKULL: No skull fracture. No significant scalp soft tissue swelling. SINUSES/ORBITS: The mastoid  air-cells are clear. The included paranasal sinuses are well-aerated.The included ocular globes and orbital contents are non-suspicious. OTHER: None. CT CERVICAL SPINE FINDINGS Alignment: Maintained cervical lordosis. Intact C1-C2 relationship. Intact craniocervical relationship. Skull base and vertebrae: Intact skull base. Osteoarthritis of the atlantodental interval. Maintained facet joints without jumped or perched facets. Lower cervical facet arthropathy with joint space narrowing and sclerosis. Soft tissues and spinal canal: No prevertebral fluid or swelling. No visible canal hematoma. Disc levels: Moderate disc flattening C2 through C6 with osteophytes and uncovertebral joint osteoarthritis. Bilateral uncinate spurring is noted off uncovertebral joints. No significant central canal stenosis. Minimal neural foraminal encroachment from uncinate spurs bilaterally. Upper chest: Negative. Other: None IMPRESSION: CT head: 1. Atrophy with chronic moderate to marked small vessel ischemic disease. 2. No acute intracranial abnormality. CT cervical spine: 1. Cervical spondylosis and facet arthropathy. 2. No acute cervical spine fracture or posttraumatic listhesis. Electronically Signed   By: Ashley Royalty M.D.   On: 05/20/2018 17:32   Ct Cervical Spine Wo Contrast  Result Date: 05/20/2018 CLINICAL DATA:  Head trauma. Patient found sitting on floor. Uncertain whether the patient fell. History of dementia and back pain yesterday. EXAM: CT HEAD WITHOUT CONTRAST CT CERVICAL SPINE WITHOUT CONTRAST TECHNIQUE: Multidetector CT imaging of the head and cervical spine was performed following the standard protocol without intravenous contrast. Multiplanar CT image reconstructions of the cervical spine were also generated. COMPARISON:  Knee 318 CT head and cervical spine FINDINGS: CT HEAD FINDINGS BRAIN: There is sulcal and ventricular prominence consistent with superficial and central atrophy, stable in appearance. No  intraparenchymal hemorrhage, mass effect nor midline shift. Moderate to marked degree of periventricular and deep white matter hypodensities consistent with chronic small vessel ischemic disease are identified. No acute large vascular territory infarcts. No abnormal extra-axial fluid collections. Basal cisterns are not effaced and midline. Brainstem and cerebellum demonstrate atrophy without acute abnormality. VASCULAR: No hyperdense vessels or unexpected calcifications. Atherosclerosis at the skull base. SKULL: No skull fracture. No significant scalp soft tissue swelling. SINUSES/ORBITS: The mastoid air-cells are clear. The included paranasal sinuses are well-aerated.The included ocular globes and orbital contents are non-suspicious. OTHER: None. CT CERVICAL SPINE FINDINGS Alignment: Maintained cervical lordosis. Intact C1-C2 relationship. Intact craniocervical relationship. Skull base and vertebrae: Intact skull base. Osteoarthritis of the atlantodental interval. Maintained facet joints without jumped or perched facets. Lower cervical facet arthropathy with joint space narrowing and sclerosis. Soft tissues and spinal canal: No prevertebral fluid or swelling. No visible canal hematoma. Disc levels: Moderate disc flattening C2  through C6 with osteophytes and uncovertebral joint osteoarthritis. Bilateral uncinate spurring is noted off uncovertebral joints. No significant central canal stenosis. Minimal neural foraminal encroachment from uncinate spurs bilaterally. Upper chest: Negative. Other: None IMPRESSION: CT head: 1. Atrophy with chronic moderate to marked small vessel ischemic disease. 2. No acute intracranial abnormality. CT cervical spine: 1. Cervical spondylosis and facet arthropathy. 2. No acute cervical spine fracture or posttraumatic listhesis. Electronically Signed   By: Ashley Royalty M.D.   On: 05/20/2018 17:32   Dg Chest Port 1 View  Result Date: 05/20/2018 CLINICAL DATA:  Weakness and fall today.  EXAM: PORTABLE CHEST 1 VIEW COMPARISON:  Chest radiographs 12/13/2014 and CT 10/06/2016 FINDINGS: The cardiac silhouette is mildly enlarged. Aortic atherosclerosis is noted. Prominence of the central pulmonary arteries is similar to the prior radiographs. No airspace consolidation, edema, sizable pleural effusion, or pneumothorax is identified although the left lateral costophrenic angle was incompletely imaged. Curvilinear calcification adjacent to the right humeral head suggests calcific rotator cuff tendinopathy. IMPRESSION: No active disease. Electronically Signed   By: Logan Bores M.D.   On: 05/20/2018 15:23    Assessment/Plan Principal Problem:   Fall Active Problems:   Rhabdomyolysis   Bilateral lower extremity edema   Serum total bilirubin elevated   Fall/ ?syncope: Patient found sitting on the floor by family, unclear how long she was on the floor. Per family, she complained of back pain yesterday but had no difficulty ambulating.  Patient has no complaints other than lightheadedness.  Fall/ syncope could possibly be orthostatic in nature.  Afebrile and hemodynamically stable.  No leukocytosis. Lactic acid level normal.  Chest x-ray without evidence of pneumonia.  UA not suggestive of infection.  Blood ethanol level negative.  T bili chronically elevated, remainder of LFTs normal.  Troponin negative and no complaints of chest pain.  CT head and cervical spine negative for acute finding.  No focal deficit on exam.  PE less likely as patient is not tachycardic or hypoxic.  No signs of respiratory distress.  ED provider requesting admission for observation as patient is too weak to ambulate in the ED and will need PT/OT evaluation and possible placement to a nursing facility. Plan: Check orthostatics.  PT/OT eval.  Urine culture pending.  EKG pending.  Check echocardiogram, carotid Dopplers, and EEG.  Cardiac monitoring.  Mild rhabdomyolysis: Patient found sitting on the floor by family, unclear  how long she was on the floor. CK 922. Plan: IV fluid hydration and continue to monitor CK.  Bilateral lower extremity edema: Bilateral lower extremities with pitting edema, warm to touch, and mild erythema.  It appears the left lower extremity is larger in size compared to the contralateral side.  Cellulitis less likely given no fever or leukocytosis. Plan: Bilateral lower extremity Dopplers to rule out DVT  Elevated T bili: T bili 2.2, remainder of LFTs normal.  Lipase normal.  Per chart review, T bili was elevated in August 2018 as well.  Patient has no complaints of abdominal pain, nausea, or vomiting.  Abdominal exam benign. Plan: Ensure outpatient follow-up with PCP.  DVT prophylaxis: Subcutaneous heparin Code Status: Full code Family Communication: No family available at this time. Disposition Plan: Anticipate discharge in 1 to 2 days. Consults called: None Admission status: Observation, telemetry  This chart was dictated using voice recognition software.  Despite best efforts to proofread, errors can occur which can change the documentation meaning.  Shela Leff MD [facility] Pager [phone]  If 7PM-7AM, please  contact night-coverage [email or web address] Password [email or web address]  05/20/2018, 9:09 PM

## 2018-05-20 NOTE — ED Provider Notes (Addendum)
[facility] EMERGENCY DEPARTMENT Provider Note   CSN: [id number] Arrival date & time: 05/20/18  1356    History   Chief Complaint Chief Complaint  Patient presents with   Fall    HPI Nicole Blackwell is a 83 y.o. female.     HPI Patient is a very limited historian.  She is pleasant and interactive but does not have a lot of recall for recent events.  Have reviewed the history with the patient's daughter by telephone.  The patient has dementia but lives independently.  She is able to walk independently in her home to obtain food and take care of basic needs.  The patient goes out with family members to do her shopping.  She does not go outside the home independently.  Her daughter reports that someone goes in daily to check on her.  Patient's daughter reports that yesterday she spent much of the afternoon with her.  She reports she left her at about sometime between 2 and 4 in the afternoon.  Her daughter reports that she did complain of some back pain once which is not typical for the patient.  She did not seem to be having any difficulty moving or walking.  She did not specify or give any additional details regarding discomfort.  Patient's daughter reports that when she left her, she was up making some food.  This afternoon patient was not returning calls so her ex-husband went to check on her.  Had to use a key to open the door.  He found her sitting in the floor in the hallway.  The patient's daughter reports they assumed she had fallen although there was no conclusive observations.  She reports that the patient required help to get up and move in the house.  They report that she could not walk independently due to weakness.  They however do not localize weakness as to be 1 extremity or the other.  For my examination, the patient is denying any pain.  She does not seem to have any recall about falling in the home.  She does recall that sometimes is with her husband but they do  not live together permanently.  He also recalls her daughter comes to check on her.  She cannot give any details however as to how or why she ended up on the floor in her home. Past Medical History:  Diagnosis Date   BACK PAIN    OSTEOPOROSIS    PLANTAR FASCIITIS    Positive PPD    SHINGLES    SYMPTOMATIC MENOPAUSAL/FEMALE CLIMACTERIC STATES    Syncope and collapse 12/13/2014   Uterine cancer [facility])     Patient Active Problem List   Diagnosis Date Noted   B12 deficiency 01/17/2018   Vascular dementia without behavioral disturbance ([place]) 01/17/2018   Laceration of scalp without foreign body    Diastolic CHF ([place]) 00/17/4944   Syncope 01/12/2012   BACK PAIN 04/30/2008   SHINGLES 09/27/2007   SYMPTOMATIC MENOPAUSAL/FEMALE CLIMACTERIC STATES 06/20/2007   Osteoporosis 03/11/2007   PLANTAR FASCIITIS 11/16/2006    Past Surgical History:  Procedure Laterality Date   CATARACT EXTRACTION, BILATERAL Bilateral    DILATION AND CURETTAGE OF UTERUS  1960's X 2   "I lost 2 children to miscarriage"   LAPAROSCOPIC CHOLECYSTECTOMY     SIGMOIDOSCOPY     TONSILLECTOMY     VAGINAL HYSTERECTOMY       OB History   No obstetric history on file.  Home Medications    Prior to Admission medications   Medication Sig Start Date End Date Taking? Authorizing Provider  aspirin 81 MG tablet Take 81 mg by mouth at bedtime.     [provider]  furosemide (LASIX) 20 MG tablet Take 1 tablet (20 mg total) by mouth daily for 3 days. 01/17/18 01/20/18  Billie Ruddy, MD    Family History History reviewed. No pertinent family history.  Social History Social History   Tobacco Use   Smoking status: Never Smoker   Smokeless tobacco: Never Used  Substance Use Topics   Alcohol use: No   Drug use: No     Allergies   Penicillin g benzathine and Penicillins   Review of Systems Review of Systems 10 Systems reviewed and are negative for acute change  except as noted in the HPI.  Physical Exam Updated Vital Signs BP (!) 156/66    Pulse 82    Temp 98.8 F (37.1 C) (Oral)    Resp 10    SpO2 99%   Physical Exam Constitutional:      Comments: Patient is alert and interactive.  He answers questions.  She does not have any respiratory distress.  She does not appear distressed or uncomfortable.  She is calm but seems mildly confused.  HENT:     Head: Normocephalic.     Nose: Nose normal.     Mouth/Throat:     Mouth: Mucous membranes are moist.     Pharynx: Oropharynx is clear.  Eyes:     Extraocular Movements: Extraocular movements intact.     Pupils: Pupils are equal, round, and reactive to light.  Neck:     Comments: Does not endorse C-spine tenderness to palpation. Cardiovascular:     Rate and Rhythm: Normal rate and regular rhythm.     Heart sounds: Normal heart sounds.  Pulmonary:     Effort: Pulmonary effort is normal.     Breath sounds: Normal breath sounds.     Comments: Does not endorse chest wall pain to compression. Abdominal:     General: There is no distension.     Palpations: Abdomen is soft.     Tenderness: There is no abdominal tenderness. There is no guarding.  Musculoskeletal:     Comments: 1+ edema right lower extremity 2+ edema left lower extremity.  Some diffuse erythema bilateral lower extremities.  Patient is able to tolerate both hips put into flexion.  She reports there is some pain in the lower leg with range of motion on the left.  She is equivocal in any focus of discomfort.  Bilateral upper extremities come close to range of motion without difficulty.  Patient is able to sit forward in the stretcher is not endorsing localized pain in the back or spine.  Skin:    General: Skin is warm and dry.  Neurological:     Comments: Patient is pleasant and cooperative.  She has very limited recall.  She does assist in following commands.  No focal neurologic deficits.  No cranial nerve deficits.  No focal motor  deficits of the extremities.  Psychiatric:        Mood and Affect: Mood normal.      ED Treatments / Results  Labs (all labs ordered are listed, but only abnormal results are displayed) Labs Reviewed - No data to display  EKG None  Radiology No results found.  Procedures Procedures (including critical care time)  Medications Ordered in ED Medications - No  data to display   Initial Impression / Assessment and Plan / ED Course  I have reviewed the triage vital signs and the nursing notes.  Pertinent labs & imaging results that were available during my care of the patient were reviewed by me and considered in my medical decision making (see chart for details).       Dr. Lacinda Axon to follow up on CT results and Xray. Final disposition pending diagnostic results and trial of ambulation. Suspect patient will need admission due to failure of gait/ADL and stability with mild rhabdomyolysis.  Final Clinical Impressions(s) / ED Diagnoses   Final diagnoses:  Fall, initial encounter  Ambulatory dysfunction  Non-traumatic rhabdomyolysis  Subacute confusional state    ED Discharge Orders    None       Charlesetta Shanks, MD 05/22/18 8867    Charlesetta Shanks, MD 05/22/18 [phone]

## 2018-05-20 NOTE — Progress Notes (Signed)
   05/20/18 2150  Vitals  Temp 99.1 F (37.3 C)  Temp Source Oral  BP (!) 155/74  MAP (mmHg) 94  BP Method Automatic  Pulse Rate 90  Resp 18  Oxygen Therapy  SpO2 100 %  O2 Device Room Air  MEWS Score  MEWS RR 0  MEWS Pulse 0  MEWS Systolic 0  MEWS LOC 0  MEWS Temp 0  MEWS Score 0  MEWS Score Color Green  Admitted pt to rm 3E15 from ED, pt alert to self, oriented to room, call bell placed within reach, placed on low bed, safety education provided, pt needs reinforcement.

## 2018-05-20 NOTE — ED Notes (Signed)
This RN attempted to stick pt 2x and was unsuccessful, phlebotomy called.

## 2018-05-20 NOTE — ED Notes (Signed)
Unable to ambulate the pt  She thinks she is at home  We managed to stand her up at bedside  But she would not take another step  She thinks she is at home

## 2018-05-20 NOTE — ED Triage Notes (Signed)
Pt here via [place], lives with husband, husband found pt sitting on floor in hallway today and was able to walk her to chair in living room, unsure if she fell, hx of dementia, daughter reports pt c/o back pain yesterday and was walking slower than normal. No neck pain, no weakness noted in extremities, pt walked to stretcher. A&O at baseline.

## 2018-05-20 NOTE — ED Notes (Signed)
Pt  Getting a little tired of waiting  She thinks she is at home  Trying to get up intermittently

## 2018-05-20 NOTE — ED Notes (Signed)
Pt returned from c-t  Alert  No distress

## 2018-05-20 NOTE — ED Notes (Signed)
Report called to rn on 3e 

## 2018-05-21 ENCOUNTER — Observation Stay (HOSPITAL_COMMUNITY): Payer: Medicare HMO

## 2018-05-21 DIAGNOSIS — R9401 Abnormal electroencephalogram [EEG]: Secondary | ICD-10-CM | POA: Diagnosis not present

## 2018-05-21 DIAGNOSIS — I5030 Unspecified diastolic (congestive) heart failure: Secondary | ICD-10-CM | POA: Diagnosis not present

## 2018-05-21 DIAGNOSIS — R262 Difficulty in walking, not elsewhere classified: Secondary | ICD-10-CM | POA: Diagnosis not present

## 2018-05-21 DIAGNOSIS — E538 Deficiency of other specified B group vitamins: Secondary | ICD-10-CM | POA: Diagnosis not present

## 2018-05-21 DIAGNOSIS — R600 Localized edema: Secondary | ICD-10-CM | POA: Diagnosis not present

## 2018-05-21 DIAGNOSIS — E8809 Other disorders of plasma-protein metabolism, not elsewhere classified: Secondary | ICD-10-CM | POA: Diagnosis not present

## 2018-05-21 DIAGNOSIS — G309 Alzheimer's disease, unspecified: Secondary | ICD-10-CM | POA: Diagnosis not present

## 2018-05-21 DIAGNOSIS — R55 Syncope and collapse: Secondary | ICD-10-CM | POA: Diagnosis not present

## 2018-05-21 DIAGNOSIS — T796XXA Traumatic ischemia of muscle, initial encounter: Secondary | ICD-10-CM

## 2018-05-21 DIAGNOSIS — W19XXXA Unspecified fall, initial encounter: Secondary | ICD-10-CM | POA: Diagnosis not present

## 2018-05-21 DIAGNOSIS — M6282 Rhabdomyolysis: Secondary | ICD-10-CM | POA: Diagnosis not present

## 2018-05-21 DIAGNOSIS — R17 Unspecified jaundice: Secondary | ICD-10-CM | POA: Diagnosis not present

## 2018-05-21 DIAGNOSIS — I110 Hypertensive heart disease with heart failure: Secondary | ICD-10-CM | POA: Diagnosis not present

## 2018-05-21 DIAGNOSIS — R748 Abnormal levels of other serum enzymes: Secondary | ICD-10-CM | POA: Diagnosis not present

## 2018-05-21 DIAGNOSIS — F0390 Unspecified dementia without behavioral disturbance: Secondary | ICD-10-CM

## 2018-05-21 LAB — URINE CULTURE: Culture: <10000 — AB

## 2018-05-21 LAB — D-DIMER, QUANTITATIVE: D-Dimer, Quant: 1.21 ug{FEU}/mL — ABNORMAL HIGH (ref 0.00–0.50)

## 2018-05-21 LAB — CK: Total CK: 697 U/L — ABNORMAL HIGH (ref 38–234)

## 2018-05-21 MED ORDER — SODIUM CHLORIDE 0.9 % IV SOLN
INTRAVENOUS | Status: AC
  Administered 2018-05-21: 20:00:00 via INTRAVENOUS

## 2018-05-21 NOTE — Progress Notes (Signed)
Cancelling 2D echo to minimize exposure in light of COVID 19 crisis.   Admitted with fall.  Trop normal.  Normal 2D echo 09/2016

## 2018-05-21 NOTE — Procedures (Signed)
ELECTROENCEPHALOGRAM REPORT   Patient: Nicole Blackwell       Room #: 5U31S EEG No. ID: [id number] Age: 83 y.o.        Sex: female Referring Physician: Tamala Julian Report Date:  05/21/2018        Interpreting Physician: Alexis Goodell  History: [name] is an 83 y.o. female found down by family  Medications:  None  Conditions of Recording:  This is a 21 channel routine scalp EEG performed with bipolar and monopolar montages arranged in accordance to the international 10/20 system of electrode placement. One channel was dedicated to EKG recording.  The patient is in the awake and drowsy states.  Description:  The waking background activity consists of a low voltage, symmetrical, fairly well organized, 7 Hz theta activity, seen from the parieto-occipital and posterior temporal regions.  Low voltage fast activity, poorly organized, is seen anteriorly and is at times superimposed on more posterior regions.  A mixture of theta and alpha rhythms are seen from the central and temporal regions. The patient drowses with slowing to irregular, low voltage theta and beta activity.   Stage II sleep is not obtained. No epileptiform activity is noted.   Hyperventilation and intermittent photic stimulation were not performed.  IMPRESSION: This is an abnormal EEG secondary to mild posterior background slowing.  This finding may be seen with a diffuse gray matter disturbance that is etiologically nonspecific, but may include a dementia, among other possibilities.  No epileptiform activity is noted.     Alexis Goodell, MD Neurology [phone] 05/21/2018, 1:58 PM

## 2018-05-21 NOTE — Evaluation (Signed)
Occupational Therapy Evaluation Patient Details Name: Nicole Blackwell MRN: [id number] DOB: 26-Jun-1935 Today's Date: 05/21/2018    History of Present Illness 83 yo female presenting with evaluation for fall as pt was found on the floor at home. PMH including has a past medical history of dementia, back pain, osteoporosis, plantar fascitis, shingles, Syncope and collapse (12/13/2014), and Uterine cancer.    Clinical Impression   Per chart review, pt was living alone and performing ADLs; pt poor historian due to baseline dementia and reports she lives with her daughter and husband. Pt currently requiring Min A for UB ADLs, Max A for LB ADLs, and Min-Max A for functional transfers. Pt presenting with decreased strength and poor balance with significant posterior lean. Pt lethargic in bed and alternating between opening her eyes and sleeping. Once at EOB and then seated in recliner, pt awake and participating in self feeding. Pt would benefit from further acute OT to facilitate safe dc. Recommend dc to SNF for further OT to optimize safety, independence with ADLs, and return to PLOF.      Follow Up Recommendations  SNF;Supervision/Assistance - 24 hour    Equipment Recommendations  Other (comment)(Defer to next venue)    Recommendations for Other Services PT consult     Precautions / Restrictions Precautions Precautions: Fall      Mobility Bed Mobility Overal bed mobility: Needs Assistance Bed Mobility: Supine to Sit     Supine to sit: Supervision;HOB elevated     General bed mobility comments: Supervision for safety. No physical A needed. HOB elevated. Significant time required  Transfers Overall transfer level: Needs assistance Equipment used: None Transfers: Sit to/from [facility] Sit to Stand: Min assist;From elevated surface Stand pivot transfers: Max assist       General transfer comment: Min A to power up into standing. Once in standing, pt presenting  with significant posterior lean and required Max A for balance. Pt with decreased awareness of posterior lean. Requiring Max A for maintaining balance while pivoting towards recliner.    Balance Overall balance assessment: Needs assistance Sitting-balance support: No upper extremity supported;Feet supported Sitting balance-Leahy Scale: Poor Sitting balance - Comments: posterior lean and required VCs to we4ight shift forward Postural control: Posterior lean Standing balance support: Single extremity supported;During functional activity Standing balance-Leahy Scale: Poor Standing balance comment: posterior lean and requiring physical A to maintain standing balance                           ADL either performed or assessed with clinical judgement   ADL Overall ADL's : Needs assistance/impaired Eating/Feeding: Minimal assistance;Sitting;Cueing for sequencing Eating/Feeding Details (indicate cue type and reason): Min A for setting up and opening containters. Pt able to self feed with finger foods Grooming: Wash/dry face;Set up;Supervision/safety;Sitting   Upper Body Bathing: Minimal assistance;Sitting(supported sitting)   Lower Body Bathing: Maximal assistance;+2 for physical assistance;Sit to/from stand   Upper Body Dressing : Minimal assistance;Sitting(supported sitting)   Lower Body Dressing: Maximal assistance;Sit to/from stand;+2 for physical assistance Lower Body Dressing Details (indicate cue type and reason): Pt able to bend forward and adjust socks while seated in recliner. Pt requiring Max A for standing balance due to significant posterior lean Toilet Transfer: Maximal assistance;Stand-pivot(simulated to recliner)           Functional mobility during ADLs: Maximal assistance(stand pivot only) General ADL Comments: Pt limited by decreased strength and balance. Agreeable to therapy     Vision  Perception     Praxis      Pertinent Vitals/Pain Pain  Assessment: Faces Faces Pain Scale: No hurt Pain Intervention(s): Monitored during session     Hand Dominance Right   Extremity/Trunk Assessment Upper Extremity Assessment Upper Extremity Assessment: Generalized weakness   Lower Extremity Assessment Lower Extremity Assessment: Defer to PT evaluation   Cervical / Trunk Assessment Cervical / Trunk Assessment: Kyphotic;Other exceptions Cervical / Trunk Exceptions: Significant posterior lean   Communication Communication Communication: No difficulties   Cognition Arousal/Alertness: Lethargic Behavior During Therapy: WFL for tasks assessed/performed;Flat affect Overall Cognitive Status: History of cognitive impairments - at baseline                                 General Comments: Baseline dementia. Pt lethargic in the bed and alteranting between eye open and falling back asleep. Once at EOB and in recliner, pt staying awake and participating in self feeding   General Comments       Exercises     Shoulder [place] expects to be discharged to:: Private residence Living Arrangements: Alone                               Additional Comments: Poor historian with baseline dementia. Pt reporting she lives with her daughter and husband. Chart review state pt lives alone      Prior Functioning/Environment Level of Independence: Needs assistance        Comments: Poor historian with baseline dementia. No family present to provide information. Per chart review, pt was living alone and performing ADLs. RN reporting that daughters calls pt to check on her        OT Problem List: Decreased strength;Decreased range of motion;Decreased activity tolerance;Impaired balance (sitting and/or standing);Decreased knowledge of use of DME or AE;Decreased knowledge of precautions      OT Treatment/Interventions:      OT Goals(Current goals can be found in the care plan section)  Acute Rehab OT Goals Patient Stated Goal: "Drink some hot coffee" OT Goal Formulation: With patient Time For Goal Achievement: 06/04/18 Potential to Achieve Goals: Good  OT Frequency:     Barriers to D/C: Decreased caregiver support          Co-evaluation              AM-PAC OT "6 Clicks" Daily Activity     Outcome Measure Help from another person eating meals?: A Little Help from another person taking care of personal grooming?: A Little Help from another person toileting, which includes using toliet, bedpan, or urinal?: A Lot Help from another person bathing (including washing, rinsing, drying)?: A Lot Help from another person to put on and taking off regular upper body clothing?: A Lot Help from another person to put on and taking off regular lower body clothing?: A Lot 6 Click Score: 14   End of Session Equipment Utilized During Treatment: Gait belt Nurse Communication: Mobility status  Activity Tolerance: Patient tolerated treatment well Patient left: in chair;with call bell/phone within reach;with chair alarm set  OT Visit Diagnosis: Unsteadiness on feet (R26.81);Other abnormalities of gait and mobility (R26.89);Muscle weakness (generalized) (M62.81);Other symptoms and signs involving cognitive function                Time: 0272-5366 OT Time Calculation (min): 34 min Charges:  OT  General Charges $OT Visit: 1 Visit OT Evaluation $OT Eval Moderate Complexity: 1 Mod OT Treatments $Self Care/Home Management : 8-22 mins  Adger Cantera MSOT, OTR/L Acute Rehab Pager: [phone] Office: [place] 05/21/2018, 9:24 AM

## 2018-05-21 NOTE — NC FL2 (Signed)
  [place] MEDICAID FL2 LEVEL OF CARE SCREENING TOOL     IDENTIFICATION  Patient Name: Nicole Blackwell Birthdate: [birth year] Sex: female Admission Date (Current Location): 05/20/2018  [facility] [place] and [place] Number:  Herbalist and Address:  [facility] [place]. [facility], [place] [address] [place], [place] [address]      Provider Number: [id number]  Attending Physician Name and Address:  Norval Morton, MD  Relative Name and Phone Number:       Current Level of Care: Hospital Recommended Level of Care: [place] Prior Approval Number:    Date Approved/Denied:   PASRR Number: [id number]  Discharge Plan: SNF    Current Diagnoses: Patient Active Problem List   Diagnosis Date Noted  . Dementia ([place]) 05/21/2018  . Fall 05/20/2018  . Rhabdomyolysis 05/20/2018  . Bilateral lower extremity edema 05/20/2018  . Serum total bilirubin elevated 05/20/2018  . B12 deficiency 01/17/2018  . Vascular dementia without behavioral disturbance ([place]) 01/17/2018  . Laceration of scalp without foreign body   . Diastolic CHF ([place]) 40/97/3532  . Syncope 01/12/2012  . BACK PAIN 04/30/2008  . SHINGLES 09/27/2007  . SYMPTOMATIC MENOPAUSAL/FEMALE CLIMACTERIC STATES 06/20/2007  . Osteoporosis 03/11/2007  . PLANTAR FASCIITIS 11/16/2006    Orientation RESPIRATION BLADDER Height & Weight     Self  Normal Incontinent Weight: 150 lb (68 kg) Height:  5\' 9"  (175.3 cm)  BEHAVIORAL SYMPTOMS/MOOD NEUROLOGICAL BOWEL NUTRITION STATUS      Continent Diet(see DC summary)  AMBULATORY STATUS COMMUNICATION OF NEEDS Skin   Extensive Assist Verbally Normal                       Personal Care Assistance Level of Assistance  Bathing, Feeding, Dressing Bathing Assistance: Maximum assistance Feeding assistance: Limited assistance Dressing Assistance: Maximum assistance     Functional Limitations Info  Sight, Hearing, Speech Sight Info: Adequate Hearing Info:  Adequate Speech Info: Adequate    SPECIAL CARE FACTORS FREQUENCY  PT (By licensed PT), OT (By licensed OT)     PT Frequency: 5x/wk OT Frequency: 5x/wk            Contractures Contractures Info: Not present    Additional Factors Info  Code Status, Allergies Code Status Info: Full Allergies Info: Penicillin G Benzathine, Penicillins           Current Medications (05/21/2018):  This is the current hospital active medication list Current Facility-Administered Medications  Medication Dose Route Frequency Provider Last Rate Last Dose  . 0.9 %  sodium chloride infusion   Intravenous Continuous Radene Gunning, NP 50 mL/hr at 05/21/18 0845    . acetaminophen (TYLENOL) tablet 650 mg  650 mg Oral Q6H PRN Shela Leff, MD       Or  . acetaminophen (TYLENOL) suppository 650 mg  650 mg Rectal Q6H PRN Shela Leff, MD      . heparin injection 5,000 Units  5,000 Units Subcutaneous Q8H Shela Leff, MD   5,000 Units at 05/21/18 0559     Discharge Medications: Please see discharge summary for a list of discharge medications.  Relevant Imaging Results:  Relevant Lab Results:   Additional Information SS#: [id number]  Geralynn Ochs, LCSW

## 2018-05-21 NOTE — Progress Notes (Signed)
EEG complete - results pending 

## 2018-05-21 NOTE — Care Management Obs Status (Signed)
[place] NOTIFICATION   Patient Details  Name: Nicole Blackwell MRN: [id number] Date of Birth: 22-Apr-1935   Medicare Observation Status Notification Given:  Yes    Carles Collet, RN 05/21/2018, 3:15 PM

## 2018-05-21 NOTE — Evaluation (Signed)
Physical Therapy Evaluation Patient Details Name: Nicole Blackwell MRN: [id number] DOB: 01/15/1936 Today's Date: 05/21/2018   History of Present Illness  83 yo female presenting with evaluation for fall as pt was found on the floor at home. PMH including has a past medical history of dementia, back pain, osteoporosis, plantar fascitis, shingles, Syncope and collapse (12/13/2014), and Uterine cancer.  Clinical Impression  Patient required mod-max a for all mobility. She requires close gaurding for safety. At this time she would benefit most from rehab at a SNF> Acute therapy will continue to work on improving patients safety and functional mobility.    Follow Up Recommendations SNF    Equipment Recommendations  Rolling walker with 5" wheels    Recommendations for Other Services Rehab consult     Precautions / Restrictions Precautions Precautions: Fall Restrictions Weight Bearing Restrictions: No      Mobility  Bed Mobility Overal bed mobility: Needs Assistance Bed Mobility: Supine to Sit     Supine to sit: Supervision;HOB elevated     General bed mobility comments: taken from OT eval. Patient found in chair and requested to stay up   Transfers Overall transfer level: Needs assistance Equipment used: None Transfers: Sit to/from [facility] Sit to Stand: Mod assist;Max assist Stand pivot transfers: Max assist       General transfer comment: Mod a on the first 2 trials. Patient lost balance posterioly when standing. On the second trial she was able to maintain standing. On the third trial she required max a to stand and was unable to maintain standing   Ambulation/Gait Ambulation/Gait assistance: Mod assist Gait Distance (Feet): 6 Feet Assistive device: Rolling walker (2 wheeled) Gait Pattern/deviations: Step-to pattern Gait velocity: decreased    General Gait Details: unsteady on her feet. Mod cuing to take steps and mod assist for balance and strength    Stairs            Wheelchair Mobility    Modified Rankin (Stroke Patients Only)       Balance Overall balance assessment: Needs assistance Sitting-balance support: No upper extremity supported;Feet supported Sitting balance-Leahy Scale: Poor Sitting balance - Comments: posterior lean and required VCs to we4ight shift forward Postural control: Posterior lean Standing balance support: During functional activity;Bilateral upper extremity supported Standing balance-Leahy Scale: Poor Standing balance comment: posterior lean and requiring physical A to maintain standing balance                             Pertinent Vitals/Pain Pain Assessment: No/denies pain Faces Pain Scale: No hurt Pain Intervention(s): Monitored during session    Home Living Family/patient expects to be discharged to:: Private residence Living Arrangements: Alone Available Help at Discharge: Family Type of Home: House Home Access: Stairs to enter   [employer] of Steps: ("just a few" )     Additional Comments: Poor historian with baseline dementia. Pt reporting she lives with her daughter and husband. Chart review state pt lives alone    Prior Function Level of Independence: Needs assistance   Gait / Transfers Assistance Needed: reports she did not use a device   ADL's / Homemaking Assistance Needed: unknown   Comments: Poor historian with baseline dementia. No family present to provide information. Per chart review, pt was living alone and performing ADLs. RN reporting that daughters calls pt to check on her     Hand Dominance   Dominant Hand: Right    Extremity/Trunk  Assessment   Upper Extremity Assessment Upper Extremity Assessment: Defer to OT evaluation    Lower Extremity Assessment Lower Extremity Assessment: Generalized weakness    Cervical / Trunk Assessment Cervical / Trunk Assessment: Kyphotic;Other exceptions Cervical / Trunk Exceptions: Significant  posterior lean  Communication   Communication: No difficulties  Cognition Arousal/Alertness: Lethargic Behavior During Therapy: WFL for tasks assessed/performed;Flat affect Overall Cognitive Status: History of cognitive impairments - at baseline                                 General Comments: Baseline dementia. Pt lethargic in the bed and alteranting between eye open and falling back asleep. Once at EOB and in recliner, pt staying awake and participating in self feeding      General Comments      Exercises     Assessment/Plan    PT Assessment Patient needs continued PT services  PT Problem List Decreased range of motion;Decreased strength;Decreased activity tolerance;Decreased balance;Decreased mobility;Decreased cognition;Decreased knowledge of use of DME;Decreased safety awareness       PT Treatment Interventions DME instruction;Gait training;Stair training;Functional mobility training;Therapeutic activities;Therapeutic exercise;Balance training;Patient/family education    PT Goals (Current goals can be found in the Care Plan section)  Acute Rehab PT Goals Patient Stated Goal: did not give a goal  Potential to Achieve Goals: Good    Frequency Min 3X/week   Barriers to discharge Decreased caregiver support per chart patient lives alone     Co-evaluation               AM-PAC PT "6 Clicks" Mobility  Outcome Measure Help needed turning from your back to your side while in a flat bed without using bedrails?: A Lot Help needed moving from lying on your back to sitting on the side of a flat bed without using bedrails?: A Lot Help needed moving to and from a bed to a chair (including a wheelchair)?: A Lot Help needed standing up from a chair using your arms (e.g., wheelchair or bedside chair)?: A Lot Help needed to walk in hospital room?: A Lot Help needed climbing 3-5 steps with a railing? : Total 6 Click Score: 11    End of Session Equipment  Utilized During Treatment: Gait belt Activity Tolerance: Patient limited by fatigue Patient left: in chair;with call bell/phone within reach;with chair alarm set Nurse Communication: Mobility status PT Visit Diagnosis: Unsteadiness on feet (R26.81);Repeated falls (R29.6);Difficulty in walking, not elsewhere classified (R26.2)    Time: 7106-2694 PT Time Calculation (min) (ACUTE ONLY): 28 min   Charges:   PT Evaluation $PT Eval Moderate Complexity: 1 Mod            Carney Living PT DPT  05/21/2018, 11:20 AM

## 2018-05-21 NOTE — TOC Initial Note (Signed)
Transition of Care [facility]) - Initial/Assessment Note    Patient Details  Name: Nicole Blackwell MRN: [id number] Date of Birth: 26-Dec-1935  Transition of [facility]) CM/SW Contact:    Nicole Ochs, LCSW Phone Number: 05/21/2018, 2:17 PM  Clinical Narrative:  CSW spoke with patient's daughter Nicole Blackwell, and Nicole Blackwell involved her dad/patient's husband in discussion as well. Family agreeable to SNF, preference for [facility] as the husband's mother was there previously and they had a good experience. CSW to send referral. Patient will need prior authorization from [facility] prior to transfer to SNF.                 Expected Discharge Plan: Skilled Nursing Facility Barriers to Discharge: Continued Medical Work up, Ship broker   Patient Goals and CMS Choice Patient states their goals for this hospitalization and ongoing recovery are:: patient unable to participate in goal setting at this time CMS Medicare.gov Compare Post Acute Care list provided to:: Patient Represenative (must comment)(husband and daughter) Choice offered to / list presented to : Spouse, Adult Children  Expected Discharge Plan and Services Expected Discharge Plan: [place] arrangements for the past 2 months: Single Family Home                          Prior Living Arrangements/Services Living arrangements for the past 2 months: Single Family Home Lives with:: Self Patient language and need for interpreter reviewed:: No Do you feel safe going back to the place where you live?: Yes      Need for Family Participation in Patient Care: Yes (Comment) Care giver support system in place?: No (comment)   Criminal Activity/Legal Involvement Pertinent to Current Situation/Hospitalization: No - Comment as needed  Activities of Daily Living      Permission Sought/Granted Permission sought to share information with : Facility Sport and exercise psychologist, Family Supports Permission granted to  share information with : Yes, Verbal Permission Granted  Share Information with NAME: Nicole Blackwell, [name]  Permission granted to share info w AGENCY: SNF  Permission granted to share info w Relationship: Husband, Daughter     Emotional Assessment Appearance:: Appears stated age Attitude/Demeanor/Rapport: Unable to Assess Affect (typically observed): Unable to Assess Orientation: : Oriented to Self Alcohol / Substance Use: Not Applicable Psych Involvement: No (comment)  Admission diagnosis:  Fall [W19.XXXA] Patient Active Problem List   Diagnosis Date Noted  . Dementia ([place]) 05/21/2018  . Fall 05/20/2018  . Rhabdomyolysis 05/20/2018  . Bilateral lower extremity edema 05/20/2018  . Serum total bilirubin elevated 05/20/2018  . B12 deficiency 01/17/2018  . Vascular dementia without behavioral disturbance ([place]) 01/17/2018  . Laceration of scalp without foreign body   . Diastolic CHF ([place]) 08/67/6195  . Syncope 01/12/2012  . BACK PAIN 04/30/2008  . SHINGLES 09/27/2007  . SYMPTOMATIC MENOPAUSAL/FEMALE CLIMACTERIC STATES 06/20/2007  . Osteoporosis 03/11/2007  . PLANTAR FASCIITIS 11/16/2006   PCP:  Nicole Ruddy, MD Pharmacy:   [facility] [place], [place] [address] Phone: [phone] Fax: [phone]  [id number] - [place], [place] [address] [place] [address] Phone: [phone] Fax: [phone]     Social Determinants of Health (SDOH) Interventions    Readmission Risk Interventions No flowsheet data found.

## 2018-05-21 NOTE — Progress Notes (Signed)
[facility] PROGRESS NOTE  TAISHA PENNEBAKER HAL:[id number] DOB: January 28, 1936 DOA: 05/20/2018 PCP: Billie Ruddy, MD  Assessment/Plan:  Fall/ ?syncope: Patient found sitting on the floor by family, unclear how long she was on the floor. CK elevated. Per family, she complained of back pain yesterday but had no difficulty ambulating. Somewhat orthostatic. No s/sx infection. No metabolic derangement.   T bili chronically elevated, remainder of LFTs normal.  Troponin negative and no complaints of chest pain.  CT head and cervical spine negative for acute finding.  No focal deficit on exam.  PE less likely as patient is not tachycardic or hypoxic.  No signs of respiratory distress.  echo cancelled per cardiology. Note indicates trop normal and normae echo 2018.  -carotid dopplar -PT/OT eval.  -follow urine culture - EKG pending.  .  Mild rhabdomyolysis: Patient found sitting on the floor by family, unclear how long she was on the floor. CK 922. Trending down this am but still elevated. Creatinine within limits of normal -continue gentle IV fluids -monitor urine output -recheck in am.  Bilateral lower extremity edema: Bilateral lower extremities with pitting edema, warm to touch, and mild erythema.  It appears the left lower extremity is larger in size compared to the contralateral side.  Cellulitis less likely given no fever or leukocytosis. -follow lower extremity Dopplers to rule out DVT  Elevated T bili: T bili 2.2, remainder of LFTs normal.  Lipase normal.  Per chart review, T bili was elevated in August 2018 as well.  Patient has no complaints of abdominal pain, nausea, or vomiting.  Abdominal exam benign. Plan: Ensure outpatient follow-up with PCP.  Code Status: full Family Communication: attempted to call daughter. No answer and no message capability Disposition Plan: may benefit snf. Likely home. Hopefully ready for discharge tomorrow   Consultants:    Procedures:  Carotid  dopplar  Antibiotics:    HPI/Subjective: 83 yo dementia patient found sitting on floor. CK elevated and concern for syncope. No signs infection, no metabolic derangement. No complaints cp. Mild bilateral LE edema. May benefit snf  Objective: Vitals:   05/21/18 0349 05/21/18 0758  BP: 140/65 (!) 153/64  Pulse: 84 75  Resp: 18 20  Temp: (!) 97.5 F (36.4 C) 98.8 F (37.1 C)  SpO2: 100% 100%    Intake/Output Summary (Last 24 hours) at 05/21/2018 1015 Last data filed at 05/21/2018 1001 Gross per 24 hour  Intake 1082.62 ml  Output 950 ml  Net 132.62 ml   Filed Weights   05/21/18 0348  Weight: 68 kg    Exam:   General:  Sitting up in bed quite lethargic  Cardiovascular: rrr no mgr trace-1+ LE edema  Respiratory: normal effort BS clear but distant no wheeze  Abdomen: non-distended non-tender +BS no guarding or rebounding  Musculoskeletal: joints without swelling/erythema   Data Reviewed: Basic Metabolic Panel: Recent Labs  Lab 05/20/18 1513  NA 136  K 3.6  CL 101  CO2 25  GLUCOSE 92  BUN 11  CREATININE 0.76  CALCIUM 9.2   Liver Function Tests: Recent Labs  Lab 05/20/18 1513  AST 41  ALT 26  ALKPHOS 64  BILITOT 2.2*  PROT 6.8  ALBUMIN 3.4*   Recent Labs  Lab 05/20/18 1513  LIPASE 32   No results for input(s): AMMONIA in the last 168 hours. CBC: Recent Labs  Lab 05/20/18 1513  WBC 8.8  NEUTROABS 6.1  HGB 13.5  HCT 44.7  MCV 80.4  PLT 273  Cardiac Enzymes: Recent Labs  Lab 05/20/18 1513 05/21/18 0407  CKTOTAL 922* 697*  TROPONINI <0.03  --    BNP (last 3 results) No results for input(s): BNP in the last 8760 hours.  ProBNP (last 3 results) Recent Labs    12/15/17 1225  PROBNP 52.0    CBG: No results for input(s): GLUCAP in the last 168 hours.  No results found for this or any previous visit (from the past 240 hour(s)).   Studies: Ct Head Wo Contrast  Result Date: 05/20/2018 CLINICAL DATA:  Head trauma. Patient  found sitting on floor. Uncertain whether the patient fell. History of dementia and back pain yesterday. EXAM: CT HEAD WITHOUT CONTRAST CT CERVICAL SPINE WITHOUT CONTRAST TECHNIQUE: Multidetector CT imaging of the head and cervical spine was performed following the standard protocol without intravenous contrast. Multiplanar CT image reconstructions of the cervical spine were also generated. COMPARISON:  Knee 318 CT head and cervical spine FINDINGS: CT HEAD FINDINGS BRAIN: There is sulcal and ventricular prominence consistent with superficial and central atrophy, stable in appearance. No intraparenchymal hemorrhage, mass effect nor midline shift. Moderate to marked degree of periventricular and deep white matter hypodensities consistent with chronic small vessel ischemic disease are identified. No acute large vascular territory infarcts. No abnormal extra-axial fluid collections. Basal cisterns are not effaced and midline. Brainstem and cerebellum demonstrate atrophy without acute abnormality. VASCULAR: No hyperdense vessels or unexpected calcifications. Atherosclerosis at the skull base. SKULL: No skull fracture. No significant scalp soft tissue swelling. SINUSES/ORBITS: The mastoid air-cells are clear. The included paranasal sinuses are well-aerated.The included ocular globes and orbital contents are non-suspicious. OTHER: None. CT CERVICAL SPINE FINDINGS Alignment: Maintained cervical lordosis. Intact C1-C2 relationship. Intact craniocervical relationship. Skull base and vertebrae: Intact skull base. Osteoarthritis of the atlantodental interval. Maintained facet joints without jumped or perched facets. Lower cervical facet arthropathy with joint space narrowing and sclerosis. Soft tissues and spinal canal: No prevertebral fluid or swelling. No visible canal hematoma. Disc levels: Moderate disc flattening C2 through C6 with osteophytes and uncovertebral joint osteoarthritis. Bilateral uncinate spurring is noted off  uncovertebral joints. No significant central canal stenosis. Minimal neural foraminal encroachment from uncinate spurs bilaterally. Upper chest: Negative. Other: None IMPRESSION: CT head: 1. Atrophy with chronic moderate to marked small vessel ischemic disease. 2. No acute intracranial abnormality. CT cervical spine: 1. Cervical spondylosis and facet arthropathy. 2. No acute cervical spine fracture or posttraumatic listhesis. Electronically Signed   By: Ashley Royalty M.D.   On: 05/20/2018 17:32   Ct Cervical Spine Wo Contrast  Result Date: 05/20/2018 CLINICAL DATA:  Head trauma. Patient found sitting on floor. Uncertain whether the patient fell. History of dementia and back pain yesterday. EXAM: CT HEAD WITHOUT CONTRAST CT CERVICAL SPINE WITHOUT CONTRAST TECHNIQUE: Multidetector CT imaging of the head and cervical spine was performed following the standard protocol without intravenous contrast. Multiplanar CT image reconstructions of the cervical spine were also generated. COMPARISON:  Knee 318 CT head and cervical spine FINDINGS: CT HEAD FINDINGS BRAIN: There is sulcal and ventricular prominence consistent with superficial and central atrophy, stable in appearance. No intraparenchymal hemorrhage, mass effect nor midline shift. Moderate to marked degree of periventricular and deep white matter hypodensities consistent with chronic small vessel ischemic disease are identified. No acute large vascular territory infarcts. No abnormal extra-axial fluid collections. Basal cisterns are not effaced and midline. Brainstem and cerebellum demonstrate atrophy without acute abnormality. VASCULAR: No hyperdense vessels or unexpected calcifications. Atherosclerosis at the skull base. SKULL:  No skull fracture. No significant scalp soft tissue swelling. SINUSES/ORBITS: The mastoid air-cells are clear. The included paranasal sinuses are well-aerated.The included ocular globes and orbital contents are non-suspicious. OTHER: None.  CT CERVICAL SPINE FINDINGS Alignment: Maintained cervical lordosis. Intact C1-C2 relationship. Intact craniocervical relationship. Skull base and vertebrae: Intact skull base. Osteoarthritis of the atlantodental interval. Maintained facet joints without jumped or perched facets. Lower cervical facet arthropathy with joint space narrowing and sclerosis. Soft tissues and spinal canal: No prevertebral fluid or swelling. No visible canal hematoma. Disc levels: Moderate disc flattening C2 through C6 with osteophytes and uncovertebral joint osteoarthritis. Bilateral uncinate spurring is noted off uncovertebral joints. No significant central canal stenosis. Minimal neural foraminal encroachment from uncinate spurs bilaterally. Upper chest: Negative. Other: None IMPRESSION: CT head: 1. Atrophy with chronic moderate to marked small vessel ischemic disease. 2. No acute intracranial abnormality. CT cervical spine: 1. Cervical spondylosis and facet arthropathy. 2. No acute cervical spine fracture or posttraumatic listhesis. Electronically Signed   By: Ashley Royalty M.D.   On: 05/20/2018 17:32   Dg Chest Port 1 View  Result Date: 05/20/2018 CLINICAL DATA:  Weakness and fall today. EXAM: PORTABLE CHEST 1 VIEW COMPARISON:  Chest radiographs 12/13/2014 and CT 10/06/2016 FINDINGS: The cardiac silhouette is mildly enlarged. Aortic atherosclerosis is noted. Prominence of the central pulmonary arteries is similar to the prior radiographs. No airspace consolidation, edema, sizable pleural effusion, or pneumothorax is identified although the left lateral costophrenic angle was incompletely imaged. Curvilinear calcification adjacent to the right humeral head suggests calcific rotator cuff tendinopathy. IMPRESSION: No active disease. Electronically Signed   By: Logan Bores M.D.   On: 05/20/2018 15:23    Scheduled Meds: . heparin  5,000 Units Subcutaneous Q8H   Continuous Infusions: . sodium chloride      Principal Problem:    Fall Active Problems:   Rhabdomyolysis   Bilateral lower extremity edema   Serum total bilirubin elevated   Dementia ([place])    Time spent: 84 minutes    Becker NP  [facility]  If 7PM-7AM, please contact night-coverage at [email or web address], password [facility] 05/21/2018, 10:15 AM  LOS: 0 days

## 2018-05-21 NOTE — Care Management (Signed)
Consult to assess for [employer], patient does not qualify due to insurance provider.

## 2018-05-22 ENCOUNTER — Encounter (HOSPITAL_COMMUNITY): Payer: Self-pay | Admitting: Internal Medicine

## 2018-05-22 ENCOUNTER — Observation Stay (HOSPITAL_BASED_OUTPATIENT_CLINIC_OR_DEPARTMENT_OTHER): Payer: Medicare HMO

## 2018-05-22 DIAGNOSIS — I82409 Acute embolism and thrombosis of unspecified deep veins of unspecified lower extremity: Secondary | ICD-10-CM | POA: Diagnosis not present

## 2018-05-22 DIAGNOSIS — F0390 Unspecified dementia without behavioral disturbance: Secondary | ICD-10-CM | POA: Diagnosis not present

## 2018-05-22 DIAGNOSIS — R600 Localized edema: Secondary | ICD-10-CM | POA: Diagnosis not present

## 2018-05-22 DIAGNOSIS — E538 Deficiency of other specified B group vitamins: Secondary | ICD-10-CM | POA: Diagnosis not present

## 2018-05-22 DIAGNOSIS — R55 Syncope and collapse: Secondary | ICD-10-CM | POA: Diagnosis not present

## 2018-05-22 DIAGNOSIS — R17 Unspecified jaundice: Secondary | ICD-10-CM | POA: Diagnosis not present

## 2018-05-22 DIAGNOSIS — R9401 Abnormal electroencephalogram [EEG]: Secondary | ICD-10-CM | POA: Diagnosis not present

## 2018-05-22 DIAGNOSIS — W19XXXA Unspecified fall, initial encounter: Secondary | ICD-10-CM | POA: Diagnosis not present

## 2018-05-22 DIAGNOSIS — R262 Difficulty in walking, not elsewhere classified: Secondary | ICD-10-CM | POA: Diagnosis not present

## 2018-05-22 DIAGNOSIS — T796XXA Traumatic ischemia of muscle, initial encounter: Secondary | ICD-10-CM | POA: Diagnosis not present

## 2018-05-22 LAB — BASIC METABOLIC PANEL
Anion gap: 7 (ref 5–15)
BUN: 12 mg/dL (ref 8–23)
CO2: 24 mmol/L (ref 22–32)
Calcium: 8.2 mg/dL — ABNORMAL LOW (ref 8.9–10.3)
Chloride: 109 mmol/L (ref 98–111)
Creatinine, Ser: 0.94 mg/dL (ref 0.44–1.00)
GFR calc Af Amer: >60 mL/min (ref >60)
GFR, EST NON AFRICAN AMERICAN: 57 mL/min — AB (ref >60)
Glucose, Bld: 83 mg/dL (ref 70–99)
POTASSIUM: 3.4 mmol/L — AB (ref 3.5–5.1)
Sodium: 140 mmol/L (ref 135–145)

## 2018-05-22 LAB — CK: Total CK: 504 U/L — ABNORMAL HIGH (ref 38–234)

## 2018-05-22 LAB — MAGNESIUM: Magnesium: 2.1 mg/dL (ref 1.7–2.4)

## 2018-05-22 MED ORDER — SODIUM CHLORIDE 0.9 % IV SOLN
INTRAVENOUS | Status: AC
  Administered 2018-05-22: 09:00:00 via INTRAVENOUS

## 2018-05-22 MED ORDER — POTASSIUM CHLORIDE CRYS ER 20 MEQ PO TBCR
40.0000 meq | EXTENDED_RELEASE_TABLET | Freq: Once | ORAL | Status: AC
  Administered 2018-05-22: 40 meq via ORAL

## 2018-05-22 NOTE — Progress Notes (Signed)
VASCULAR LAB PRELIMINARY  PRELIMINARY  PRELIMINARY  PRELIMINARY  Bilateral lower extremity venous duplex completed.    Preliminary report:  See CV Proc for preliminary results.  Lella Mullany, RVT 05/22/2018, 10:49 AM

## 2018-05-22 NOTE — Progress Notes (Signed)
VASCULAR LAB PRELIMINARY  PRELIMINARY  PRELIMINARY  PRELIMINARY  Carotid duplex completed.    Preliminary report:  See CV proc for results  Damari Hiltz, RVT 05/22/2018, 10:45 AM

## 2018-05-22 NOTE — Progress Notes (Signed)
[facility] PROGRESS NOTE  Nicole Blackwell:[id number] DOB: 1935-11-09 DOA: 05/20/2018 PCP: Billie Ruddy, MD  Assessment/Plan:  Fall/?syncope:Patient found sitting on the floor by family, unclear how long she was on the floor. CK elevated but trending down. No s/sx infection. Urine culture with insignificant growth.  No metabolic derangement.  T bili chronically elevated, remainder of LFTs normal. Troponin negativeand no complaints of chest pain. CT head and cervical spine negative for acute finding. No focal deficit on exam. PE less likely as patient is not tachycardic or hypoxic.No signs of respiratory distress. echo cancelled per cardiology. Note indicates trop normal and normal echo 2018. LE dopplar negative DVT. Hold off on carotid dopplar for now given covid 19. EEG consistend with dementia.  Evaluated by PT who recommend snf and family and patient agree.  -snf tomorrow   Mild rhabdomyolysis:Patient found sitting on the floor by family, unclear how long she was on the floor.CK 504 this am down from  922. Creatinine within limits of normal -continue gentle IV fluids -monitor urine output -recheck in am.  Bilateral lower extremity edema:Bilateral lower extremities with pitting edema, warm to touch, and mild erythema. It appears the left lower extremity is larger in size compared to the contralateral side. Cellulitis less likely given no fever or leukocytosis. Dopplers  Negative for DVT.  Elevated T bili:T bili 2.2, remainder of LFTs normal. Lipase normal. Per chart review, T bili was elevated in August 2018 as well.Patient has no complaints of abdominal pain, nausea, or vomiting. Abdominal exam benign. Plan: Ensure outpatient follow-up with PCP.  Code Status: full Family Communication: no answer on phone Disposition Plan: snf hopefully tomorrow   Consultants:    Procedures:  dopplar  Antibiotics:    HPI/Subjective: 83 yo dementia patient  found sitting on floor. CK elevated and concern for syncope. No signs infection, no metabolic derangement. No complaints cp. Mild bilateral LE edema. May benefit snf  Awake alert this am. Denies pain and requesting assistance with breakfast  Objective: Vitals:   05/22/18 0355 05/22/18 0826  BP: (!) 163/74 (!) 184/81  Pulse: 75 69  Resp: 20 16  Temp: (!) 97.5 F (36.4 C) 98.3 F (36.8 C)  SpO2: 100% 100%    Intake/Output Summary (Last 24 hours) at 05/22/2018 1121 Last data filed at 05/22/2018 5361 Gross per 24 hour  Intake 1401.89 ml  Output 1000 ml  Net 401.89 ml   Filed Weights   05/21/18 0348 05/22/18 0355  Weight: 68 kg 67.6 kg    Exam:   General:  Awake alert oriented to self and place  Cardiovascular: rrr no mgr no LE edema  Respiratory: normal effort BS clear bilaterally no wheeze  Abdomen: non-distended non-tender no guarding or rebounding. +BS  Musculoskeletal: joints without swelling/erythema   Data Reviewed: Basic Metabolic Panel: Recent Labs  Lab 05/20/18 1513 05/22/18 0336  NA 136 140  K 3.6 3.4*  CL 101 109  CO2 25 24  GLUCOSE 92 83  BUN 11 12  CREATININE 0.76 0.94  CALCIUM 9.2 8.2*  MG  --  2.1   Liver Function Tests: Recent Labs  Lab 05/20/18 1513  AST 41  ALT 26  ALKPHOS 64  BILITOT 2.2*  PROT 6.8  ALBUMIN 3.4*   Recent Labs  Lab 05/20/18 1513  LIPASE 32   No results for input(s): AMMONIA in the last 168 hours. CBC: Recent Labs  Lab 05/20/18 1513  WBC 8.8  NEUTROABS 6.1  HGB 13.5  HCT 44.7  MCV 80.4  PLT 273   Cardiac Enzymes: Recent Labs  Lab 05/20/18 1513 05/21/18 0407 05/22/18 0336  CKTOTAL 922* 697* 504*  TROPONINI <0.03  --   --    BNP (last 3 results) No results for input(s): BNP in the last 8760 hours.  ProBNP (last 3 results) Recent Labs    12/15/17 1225  PROBNP 52.0    CBG: No results for input(s): GLUCAP in the last 168 hours.  Recent Results (from the past 240 hour(s))  Urine culture      Status: Abnormal   Collection Time: 05/20/18  2:55 PM  Result Value Ref Range Status   Specimen Description URINE, RANDOM  Final   Special Requests NONE  Final   Culture (A)  Final    <10,000 COLONIES/mL INSIGNIFICANT GROWTH Performed at [facility], [address] [place], [place] [address]    Report Status 05/21/2018 FINAL  Final     Studies: Ct Head Wo Contrast  Result Date: 05/20/2018 CLINICAL DATA:  Head trauma. Patient found sitting on floor. Uncertain whether the patient fell. History of dementia and back pain yesterday. EXAM: CT HEAD WITHOUT CONTRAST CT CERVICAL SPINE WITHOUT CONTRAST TECHNIQUE: Multidetector CT imaging of the head and cervical spine was performed following the standard protocol without intravenous contrast. Multiplanar CT image reconstructions of the cervical spine were also generated. COMPARISON:  Knee 318 CT head and cervical spine FINDINGS: CT HEAD FINDINGS BRAIN: There is sulcal and ventricular prominence consistent with superficial and central atrophy, stable in appearance. No intraparenchymal hemorrhage, mass effect nor midline shift. Moderate to marked degree of periventricular and deep white matter hypodensities consistent with chronic small vessel ischemic disease are identified. No acute large vascular territory infarcts. No abnormal extra-axial fluid collections. Basal cisterns are not effaced and midline. Brainstem and cerebellum demonstrate atrophy without acute abnormality. VASCULAR: No hyperdense vessels or unexpected calcifications. Atherosclerosis at the skull base. SKULL: No skull fracture. No significant scalp soft tissue swelling. SINUSES/ORBITS: The mastoid air-cells are clear. The included paranasal sinuses are well-aerated.The included ocular globes and orbital contents are non-suspicious. OTHER: None. CT CERVICAL SPINE FINDINGS Alignment: Maintained cervical lordosis. Intact C1-C2 relationship. Intact craniocervical relationship. Skull base  and vertebrae: Intact skull base. Osteoarthritis of the atlantodental interval. Maintained facet joints without jumped or perched facets. Lower cervical facet arthropathy with joint space narrowing and sclerosis. Soft tissues and spinal canal: No prevertebral fluid or swelling. No visible canal hematoma. Disc levels: Moderate disc flattening C2 through C6 with osteophytes and uncovertebral joint osteoarthritis. Bilateral uncinate spurring is noted off uncovertebral joints. No significant central canal stenosis. Minimal neural foraminal encroachment from uncinate spurs bilaterally. Upper chest: Negative. Other: None IMPRESSION: CT head: 1. Atrophy with chronic moderate to marked small vessel ischemic disease. 2. No acute intracranial abnormality. CT cervical spine: 1. Cervical spondylosis and facet arthropathy. 2. No acute cervical spine fracture or posttraumatic listhesis. Electronically Signed   By: Ashley Royalty M.D.   On: 05/20/2018 17:32   Ct Cervical Spine Wo Contrast  Result Date: 05/20/2018 CLINICAL DATA:  Head trauma. Patient found sitting on floor. Uncertain whether the patient fell. History of dementia and back pain yesterday. EXAM: CT HEAD WITHOUT CONTRAST CT CERVICAL SPINE WITHOUT CONTRAST TECHNIQUE: Multidetector CT imaging of the head and cervical spine was performed following the standard protocol without intravenous contrast. Multiplanar CT image reconstructions of the cervical spine were also generated. COMPARISON:  Knee 318 CT head and cervical spine FINDINGS: CT HEAD FINDINGS BRAIN: There  is sulcal and ventricular prominence consistent with superficial and central atrophy, stable in appearance. No intraparenchymal hemorrhage, mass effect nor midline shift. Moderate to marked degree of periventricular and deep white matter hypodensities consistent with chronic small vessel ischemic disease are identified. No acute large vascular territory infarcts. No abnormal extra-axial fluid collections. Basal  cisterns are not effaced and midline. Brainstem and cerebellum demonstrate atrophy without acute abnormality. VASCULAR: No hyperdense vessels or unexpected calcifications. Atherosclerosis at the skull base. SKULL: No skull fracture. No significant scalp soft tissue swelling. SINUSES/ORBITS: The mastoid air-cells are clear. The included paranasal sinuses are well-aerated.The included ocular globes and orbital contents are non-suspicious. OTHER: None. CT CERVICAL SPINE FINDINGS Alignment: Maintained cervical lordosis. Intact C1-C2 relationship. Intact craniocervical relationship. Skull base and vertebrae: Intact skull base. Osteoarthritis of the atlantodental interval. Maintained facet joints without jumped or perched facets. Lower cervical facet arthropathy with joint space narrowing and sclerosis. Soft tissues and spinal canal: No prevertebral fluid or swelling. No visible canal hematoma. Disc levels: Moderate disc flattening C2 through C6 with osteophytes and uncovertebral joint osteoarthritis. Bilateral uncinate spurring is noted off uncovertebral joints. No significant central canal stenosis. Minimal neural foraminal encroachment from uncinate spurs bilaterally. Upper chest: Negative. Other: None IMPRESSION: CT head: 1. Atrophy with chronic moderate to marked small vessel ischemic disease. 2. No acute intracranial abnormality. CT cervical spine: 1. Cervical spondylosis and facet arthropathy. 2. No acute cervical spine fracture or posttraumatic listhesis. Electronically Signed   By: Ashley Royalty M.D.   On: 05/20/2018 17:32   Dg Chest Port 1 View  Result Date: 05/20/2018 CLINICAL DATA:  Weakness and fall today. EXAM: PORTABLE CHEST 1 VIEW COMPARISON:  Chest radiographs 12/13/2014 and CT 10/06/2016 FINDINGS: The cardiac silhouette is mildly enlarged. Aortic atherosclerosis is noted. Prominence of the central pulmonary arteries is similar to the prior radiographs. No airspace consolidation, edema, sizable pleural  effusion, or pneumothorax is identified although the left lateral costophrenic angle was incompletely imaged. Curvilinear calcification adjacent to the right humeral head suggests calcific rotator cuff tendinopathy. IMPRESSION: No active disease. Electronically Signed   By: Logan Bores M.D.   On: 05/20/2018 15:23   Vas US Carotid  Result Date: 05/22/2018 Carotid Arterial Duplex Study Indications:       Fall, question syncpoe. Other Factors:     Dementia. Comparison Study:  Prior CV from 01/18/12 is available for comparison Performing Technologist: Sharion Dove RVS  Examination Guidelines: A complete evaluation includes B-mode imaging, spectral Doppler, color Doppler, and power Doppler as needed of all accessible portions of each vessel. Bilateral testing is considered an integral part of a complete examination. Limited examinations for reoccurring indications may be performed as noted.  Right Carotid Findings: +----------+--------+--------+--------+--------+------------------+           PSV cm/sEDV cm/sStenosisDescribeComments           +----------+--------+--------+--------+--------+------------------+ CCA Prox  143     10                      intimal thickening +----------+--------+--------+--------+--------+------------------+ CCA Distal104     8                       intimal thickening +----------+--------+--------+--------+--------+------------------+ ICA Prox  46      6                                          +----------+--------+--------+--------+--------+------------------+  ICA Distal39      13                                         +----------+--------+--------+--------+--------+------------------+ ECA       93      11                                         +----------+--------+--------+--------+--------+------------------+ +----------+--------+-------+--------+-------------------+           PSV cm/sEDV cmsDescribeArm Pressure (mmHG)  +----------+--------+-------+--------+-------------------+ [id number]                                         +----------+--------+-------+--------+-------------------+ +---------+--------+--+--------+--+ VertebralPSV cm/s94EDV cm/s10 +---------+--------+--+--------+--+  Left Carotid Findings: +----------+--------+--------+--------+--------+------------------+           PSV cm/sEDV cm/sStenosisDescribeComments           +----------+--------+--------+--------+--------+------------------+ CCA Prox  123     14                      intimal thickening +----------+--------+--------+--------+--------+------------------+ CCA Distal99      12                      intimal thickening +----------+--------+--------+--------+--------+------------------+ ICA Prox  58      11                                         +----------+--------+--------+--------+--------+------------------+ ICA Distal66      12                                         +----------+--------+--------+--------+--------+------------------+ ECA       99      9                                          +----------+--------+--------+--------+--------+------------------+ +----------+--------+--------+--------+-------------------+ SubclavianPSV cm/sEDV cm/sDescribeArm Pressure (mmHG) +----------+--------+--------+--------+-------------------+           210                                         +----------+--------+--------+--------+-------------------+ +---------+--------+---+--------+--+ VertebralPSV cm/s104EDV cm/s16 +---------+--------+---+--------+--+  Summary: Right Carotid: The extracranial vessels were near-normal with only minimal wall                thickening or plaque. Left Carotid: The extracranial vessels were near-normal with only minimal wall               thickening or plaque. Vertebrals:  Bilateral vertebral arteries demonstrate antegrade flow. Subclavians: Normal flow  hemodynamics were seen in bilateral subclavian              arteries. *See table(s) above for measurements and observations.     Preliminary    Vas [place] Lower Extremity Venous (dvt)  Result Date: 05/22/2018  Lower Venous Study Indications: Pitting edema.  Performing Technologist: Sharion Dove RVS  Examination Guidelines: A complete evaluation includes B-mode imaging, spectral Doppler, color Doppler, and power Doppler as needed of all accessible portions of each vessel. Bilateral testing is considered an integral part of a complete examination. Limited examinations for reoccurring indications may be performed as noted.  Right Venous Findings: +---------+---------------+---------+-----------+----------+-------+          CompressibilityPhasicitySpontaneityPropertiesSummary +---------+---------------+---------+-----------+----------+-------+ CFV      Full           Yes      Yes                          +---------+---------------+---------+-----------+----------+-------+ SFJ      Full                                                 +---------+---------------+---------+-----------+----------+-------+ FV Prox  Full                                                 +---------+---------------+---------+-----------+----------+-------+ FV Mid   Full                                                 +---------+---------------+---------+-----------+----------+-------+ FV DistalFull                                                 +---------+---------------+---------+-----------+----------+-------+ PFV      Full                                                 +---------+---------------+---------+-----------+----------+-------+ POP      Full           Yes      Yes                          +---------+---------------+---------+-----------+----------+-------+ PTV      Full                                                  +---------+---------------+---------+-----------+----------+-------+ PERO     Full                                                 +---------+---------------+---------+-----------+----------+-------+  Left Venous Findings: +---------+---------------+---------+-----------+----------+-------+          CompressibilityPhasicitySpontaneityPropertiesSummary +---------+---------------+---------+-----------+----------+-------+ CFV      Full           Yes      Yes                          +---------+---------------+---------+-----------+----------+-------+  SFJ      Full                                                 +---------+---------------+---------+-----------+----------+-------+ FV Prox  Full                                                 +---------+---------------+---------+-----------+----------+-------+ FV Mid   Full                                                 +---------+---------------+---------+-----------+----------+-------+ FV DistalFull                                                 +---------+---------------+---------+-----------+----------+-------+ PFV      Full                                                 +---------+---------------+---------+-----------+----------+-------+ POP      Full           Yes      Yes                          +---------+---------------+---------+-----------+----------+-------+ PTV      Full                                                 +---------+---------------+---------+-----------+----------+-------+ PERO     Full                                                 +---------+---------------+---------+-----------+----------+-------+    Summary: Right: There is no evidence of deep vein thrombosis in the lower extremity. Left: There is no evidence of deep vein thrombosis in the lower extremity.  *See table(s) above for measurements and observations.    Preliminary     Scheduled Meds: . heparin   5,000 Units Subcutaneous Q8H  . potassium chloride  40 mEq Oral Once   Continuous Infusions: . sodium chloride 50 mL/hr at 05/22/18 0272    Principal Problem:   Fall Active Problems:   Syncope   Rhabdomyolysis   Bilateral lower extremity edema   Diastolic CHF (HCC)   Serum total bilirubin elevated   Dementia (HCC)   Abnormal EEG   B12 deficiency    Time spent: 72 minutes    [place] NP  [facility] If 7PM-7AM, please contact night-coverage at [email or web address], password [facility] 05/22/2018, 11:21 AM  LOS: 0 days

## 2018-05-23 DIAGNOSIS — T796XXA Traumatic ischemia of muscle, initial encounter: Secondary | ICD-10-CM | POA: Diagnosis not present

## 2018-05-23 DIAGNOSIS — R17 Unspecified jaundice: Secondary | ICD-10-CM | POA: Diagnosis not present

## 2018-05-23 DIAGNOSIS — E8809 Other disorders of plasma-protein metabolism, not elsewhere classified: Secondary | ICD-10-CM | POA: Diagnosis not present

## 2018-05-23 DIAGNOSIS — E538 Deficiency of other specified B group vitamins: Secondary | ICD-10-CM | POA: Diagnosis not present

## 2018-05-23 DIAGNOSIS — R600 Localized edema: Secondary | ICD-10-CM | POA: Diagnosis not present

## 2018-05-23 DIAGNOSIS — M6282 Rhabdomyolysis: Secondary | ICD-10-CM | POA: Diagnosis not present

## 2018-05-23 DIAGNOSIS — R262 Difficulty in walking, not elsewhere classified: Secondary | ICD-10-CM | POA: Diagnosis not present

## 2018-05-23 DIAGNOSIS — F0390 Unspecified dementia without behavioral disturbance: Secondary | ICD-10-CM | POA: Diagnosis not present

## 2018-05-23 DIAGNOSIS — W19XXXA Unspecified fall, initial encounter: Secondary | ICD-10-CM | POA: Diagnosis not present

## 2018-05-23 DIAGNOSIS — R55 Syncope and collapse: Secondary | ICD-10-CM | POA: Diagnosis not present

## 2018-05-23 LAB — BASIC METABOLIC PANEL
Anion gap: 8 (ref 5–15)
BUN: 10 mg/dL (ref 8–23)
CO2: 25 mmol/L (ref 22–32)
Calcium: 8.4 mg/dL — ABNORMAL LOW (ref 8.9–10.3)
Chloride: 106 mmol/L (ref 98–111)
Creatinine, Ser: 0.82 mg/dL (ref 0.44–1.00)
GFR calc Af Amer: >60 mL/min (ref >60)
Glucose, Bld: 95 mg/dL (ref 70–99)
POTASSIUM: 4 mmol/L (ref 3.5–5.1)
Sodium: 139 mmol/L (ref 135–145)

## 2018-05-23 LAB — HEPATIC FUNCTION PANEL
ALT: 19 U/L (ref 0–44)
AST: 29 U/L (ref 15–41)
Albumin: 2.7 g/dL — ABNORMAL LOW (ref 3.5–5.0)
Alkaline Phosphatase: 55 U/L (ref 38–126)
BILIRUBIN INDIRECT: 0.8 mg/dL (ref 0.3–0.9)
Bilirubin, Direct: 0.2 mg/dL (ref 0.0–0.2)
Total Bilirubin: 1 mg/dL (ref 0.3–1.2)
Total Protein: 5.9 g/dL — ABNORMAL LOW (ref 6.5–8.1)

## 2018-05-23 LAB — CK: Total CK: 418 U/L — ABNORMAL HIGH (ref 38–234)

## 2018-05-23 MED ORDER — ENOXAPARIN SODIUM 40 MG/0.4ML ~~LOC~~ SOLN
40.0000 mg | SUBCUTANEOUS | Status: DC
  Administered 2018-05-23: 40 mg via SUBCUTANEOUS
  Filled 2018-05-23: qty 0.4

## 2018-05-23 MED ORDER — ENSURE ENLIVE PO LIQD
237.0000 mL | Freq: Two times a day (BID) | ORAL | Status: DC
  Administered 2018-05-23 (×2): 237 mL via ORAL

## 2018-05-23 MED ORDER — HYDRALAZINE HCL 20 MG/ML IJ SOLN: 10.0000 mg | INTRAMUSCULAR | Status: DC | PRN

## 2018-05-23 MED ORDER — ASPIRIN EC 81 MG PO TBEC
81.0000 mg | DELAYED_RELEASE_TABLET | Freq: Every day | ORAL | Status: DC
  Administered 2018-05-23: 81 mg via ORAL
  Filled 2018-05-23: qty 1

## 2018-05-23 NOTE — Progress Notes (Signed)
Physical Therapy Treatment Patient Details Name: Nicole Blackwell MRN: [id number] DOB: 1935/10/22 Today's Date: 05/23/2018    History of Present Illness 83 yo female presenting with evaluation for fall as pt was found on the floor at home. PMH including has a past medical history of dementia, back pain, osteoporosis, plantar fascitis, shingles, Syncope and collapse (12/13/2014), and Uterine cancer.    PT Comments    Patient received in bed, very pleasant and interactive this morning, excited about her coffee and motivated to participate in therapy. Able to perform bed mobility with S and extended time/cues, then required initially MaxA for sit to stand which faded to MinA with RW. Able to toilet with S and stand to wipe herself with min guard and no UE support. Then able to tolerate gait training approximately 146f in hallway with cues for upright posture, improved step lengths, and navigation including reduction of R drift. She was left up in the chair with chair alarm active and all needs otherwise met this morning.     Follow Up Recommendations  SNF     Equipment Recommendations  Rolling walker with 5" wheels    Recommendations for Other Services       Precautions / Restrictions Precautions Precautions: Fall Restrictions Weight Bearing Restrictions: No    Mobility  Bed Mobility Overal bed mobility: Needs Assistance Bed Mobility: Supine to Sit     Supine to sit: Supervision     General bed mobility comments: S, extended time and cues to scoot forward to EOB   Transfers Overall transfer level: Needs assistance Equipment used: Rolling walker (2 wheeled) Transfers: Sit to/from Stand Sit to Stand: Max assist;Min assist         General transfer comment: initially maxA to come to full standing position, faded to MinA with repetition   Ambulation/Gait Ambulation/Gait assistance: Min guard Gait Distance (Feet): 120 Feet Assistive device: Rolling walker (2 wheeled) Gait  Pattern/deviations: Step-through pattern;Decreased step length - right;Decreased step length - left;Decreased dorsiflexion - right;Decreased dorsiflexion - left;Shuffle;Decreased weight shift to right;Decreased weight shift to left;Drifts right/left;Trunk flexed;Narrow base of support Gait velocity: decreased    General Gait Details: much more steady with RW but with shuffling gait pattern and gaze towards floor, cues for upright posture and improved step lengths    Stairs             Wheelchair Mobility    Modified Rankin (Stroke Patients Only)       Balance Overall balance assessment: Needs assistance Sitting-balance support: No upper extremity supported;Feet supported Sitting balance-Leahy Scale: Good     Standing balance support: During functional activity;Bilateral upper extremity supported Standing balance-Leahy Scale: Fair Standing balance comment: heavy reliance on B UE support                             Cognition Arousal/Alertness: Awake/alert Behavior During Therapy: WFL for tasks assessed/performed Overall Cognitive Status: History of cognitive impairments - at baseline                                 General Comments: baseline dementia but very pleasant and interactive, much more awake this session       Exercises      General Comments        Pertinent Vitals/Pain Pain Assessment: No/denies pain Faces Pain Scale: No hurt Pain Intervention(s): Monitored during session  Home Living                      Prior Function            PT Goals (current goals can now be found in the care plan section) Acute Rehab PT Goals Patient Stated Goal: did not give a goal  Potential to Achieve Goals: Good Progress towards PT goals: Progressing toward goals    Frequency    Min 3X/week      PT Plan Current plan remains appropriate    Co-evaluation PT/OT/SLP Co-Evaluation/Treatment: Yes Reason for Co-Treatment: To  address functional/ADL transfers;For patient/therapist safety          AM-PAC PT "6 Clicks" Mobility   Outcome Measure  Help needed turning from your back to your side while in a flat bed without using bedrails?: None Help needed moving from lying on your back to sitting on the side of a flat bed without using bedrails?: None Help needed moving to and from a bed to a chair (including a wheelchair)?: A Lot Help needed standing up from a chair using your arms (e.g., wheelchair or bedside chair)?: A Lot Help needed to walk in hospital room?: A Little Help needed climbing 3-5 steps with a railing? : A Lot 6 Click Score: 17    End of Session Equipment Utilized During Treatment: Gait belt Activity Tolerance: Patient tolerated treatment well Patient left: in chair;with call bell/phone within reach;with chair alarm set   PT Visit Diagnosis: Unsteadiness on feet (R26.81);Repeated falls (R29.6);Difficulty in walking, not elsewhere classified (R26.2)     Time: 9047-5339 PT Time Calculation (min) (ACUTE ONLY): 32 min  Charges:  $Gait Training: 8-22 mins                     Deniece Ree PT, DPT, CBIS  Supplemental Physical Therapist [place]    Pager [phone] Acute Rehab Office [phone]

## 2018-05-23 NOTE — Progress Notes (Signed)
Occupational Therapy Treatment Patient Details Name: Nicole Blackwell MRN: [id number] DOB: December 28, 1935 Today's Date: 05/23/2018    History of present illness 83 yo female presenting with evaluation for fall as pt was found on the floor at home. PMH including has a past medical history of dementia, back pain, osteoporosis, plantar fascitis, shingles, Syncope and collapse (12/13/2014), and Uterine cancer.   OT comments  Pt completed seated grooming with set up/supervision, toileting and UB dressing with min assist. Pt pleasant and ambulating in halls with chair following and use of RW. Pt pleasant and very grateful.  Follow Up Recommendations  SNF;Supervision/Assistance - 24 hour    Equipment Recommendations       Recommendations for Other Services      Precautions / Restrictions Precautions Precautions: Fall Restrictions Weight Bearing Restrictions: No       Mobility Bed Mobility Overal bed mobility: Needs Assistance Bed Mobility: Supine to Sit     Supine to sit: Supervision     General bed mobility comments: S, extended time and cues to scoot forward to EOB   Transfers Overall transfer level: Needs assistance Equipment used: Rolling walker (2 wheeled) Transfers: Sit to/from Stand Sit to Stand: Max assist;Min assist         General transfer comment: initially maxA to come to full standing position, faded to MinA with repetition     Balance Overall balance assessment: Needs assistance Sitting-balance support: No upper extremity supported;Feet supported Sitting balance-Leahy Scale: Good     Standing balance support: During functional activity;Bilateral upper extremity supported Standing balance-Leahy Scale: Fair Standing balance comment: released walker in static standing for pericare                           ADL either performed or assessed with clinical judgement   ADL Overall ADL's : Needs assistance/impaired Eating/Feeding: Independent;Sitting    Grooming: Wash/dry hands;Wash/dry face;Sitting;Set up;Supervision/safety           Upper Body Dressing : Minimal assistance;Sitting       Toilet Transfer: Minimal assistance;RW;BSC;Ambulation   Toileting- Clothing Manipulation and Hygiene: Minimal assistance;Sit to/from stand       Functional mobility during ADLs: Minimal assistance;Cueing for safety;Rolling walker;+2 for safety/equipment       Vision       Perception     Praxis      Cognition Arousal/Alertness: Awake/alert Behavior During Therapy: WFL for tasks assessed/performed Overall Cognitive Status: History of cognitive impairments - at baseline                                 General Comments: baseline dementia but very pleasant and interactive, much more awake this session         Exercises     Shoulder Instructions       General Comments      Pertinent Vitals/ Pain       Pain Assessment: No/denies pain Faces Pain Scale: No hurt Pain Intervention(s): Monitored during session  Home Living                                          Prior Functioning/Environment              Frequency  Min 2X/week        Progress Toward Goals  OT Goals(current goals can now be found in the care plan section)  Progress towards OT goals: Progressing toward goals  Acute Rehab OT Goals Patient Stated Goal: did not give a goal  OT Goal Formulation: With patient Time For Goal Achievement: 06/04/18 Potential to Achieve Goals: Good  Plan Discharge plan remains appropriate    Co-evaluation      Reason for Co-Treatment: To address functional/ADL transfers;For patient/therapist safety          AM-PAC OT "6 Clicks" Daily Activity     Outcome Measure   Help from another person eating meals?: None Help from another person taking care of personal grooming?: A Little Help from another person toileting, which includes using toliet, bedpan, or urinal?: A Little Help  from another person bathing (including washing, rinsing, drying)?: A Lot Help from another person to put on and taking off regular upper body clothing?: A Little Help from another person to put on and taking off regular lower body clothing?: A Lot 6 Click Score: 17    End of Session Equipment Utilized During Treatment: Gait belt;Rolling walker  OT Visit Diagnosis: Unsteadiness on feet (R26.81);Other abnormalities of gait and mobility (R26.89);Muscle weakness (generalized) (M62.81);Other symptoms and signs involving cognitive function   Activity Tolerance Patient tolerated treatment well   Patient Left in chair;with call bell/phone within reach;with chair alarm set   Nurse Communication          Time: [phone] OT Time Calculation (min): 32 min  Charges: OT General Charges $OT Visit: 1 Visit OT Treatments $Self Care/Home Management : 8-22 mins  Nestor Lewandowsky, OTR/L Acute Rehabilitation Services Pager: [phone] Office: [phone]  Nicole Blackwell 05/23/2018, 9:51 AM

## 2018-05-23 NOTE — TOC Progression Note (Signed)
Transition of Care [facility]) - Progression Note    Patient Details  Name: Nicole Blackwell MRN: [id number] Date of Birth: 1935-05-11  [facility] CM/SW Contact  Candie Chroman, LCSW Phone Number: 05/23/2018, 9:46 AM  Clinical Narrative: [facility] unable to offer bed because they are not in network with patient's insurance. Called and spoke to both spouse and daughter. Reviewed bed offers and they have chosen [facility]. CSW has left message for hospital liaison and admissions coordinator asking them to start authorization. Husband and daughter understand this typically takes 24-48 hours to obtain.  Expected Discharge Plan: [place] Barriers to Discharge: Continued Medical Work up, Ship broker  Expected Discharge Plan and Services Expected Discharge Plan: [place] arrangements for the past 2 months: Single Family Home                           Social Determinants of Health (SDOH) Interventions    Readmission Risk Interventions No flowsheet data found.

## 2018-05-23 NOTE — Progress Notes (Addendum)
Progress Note    Nicole Blackwell  MBW:[id number] DOB: 02/01/1936  DOA: 05/20/2018 PCP: Billie Ruddy, MD    Brief Narrative:   Chief complaint: Fall  Medical records reviewed and are as summarized below:  Nicole Blackwell is an 83 y.o. 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  Fall/?syncope:Patient found sitting on the floor by family, unclear how long she was on the floor.CK elevated, but trending down. No significant signs of infection. Urine culture with insignificant growth.  No metabolic derangement.Troponin negativeand no complaints of chest pain. CT head and cervical spine negative for acute finding. No focal deficit on exam. PE less likely as patient is not tachycardic or hypoxic.No signs of respiratory distress.Echocardiogram cancelled per cardiology. Note indicates trop normal and normal echo 2018.  D-dimer was positive but lower extremity dopplar negative DVT.  Carotid Dopplers negative for any significant coronary arteries stenosis. EEG consistend possibly with dementia.  Telemetry showed no significant abnormalities.  Suspect mechanical fall or possible vasovagal syncope.  Evaluated by PT who recommend snf and family and patient agree. Social work working on placement. -Pending SNF placement  2.   Essential hypertension: Blood pressures elevated up to 184/81 patient not on any blood pressure medications at home. -Hydralazine as needed systolic blood pressure greater than 180 -Consider starting blood pressure medication if blood pressures remain elevated  3.  Mild rhabdomyolysis:Resolving. Patient found sitting on the floor by  family, unclear how long she was on the floor. On admission CK 922 and has been trending downward with last CK 418.  Thought to be traumatic in nature related with fall or prolonged period of being on floor.  Creatinine within limits of normal. -Discontinue normal saline IV fluids -Encourage p.o. fluids -monitor urine output  4.  Bilateral lower extremity edema:Bilateral lower extremities with pitting edema, warm to touch, and mild erythema. It appeared the left lower extremity is larger in size compared to the contralateral side. Cellulitis less likely given no fever or leukocytosis. Doppler vascular ultrasound of the lower extremities negative for DVT.  Suspect could be secondary to poor nutrition status.  -TED hose  5. Elevated total bilirubin:Resolved.  T bili 2.2 on admission with remainder of LFTs normal. Lipase normal. Per chart review, T bili was elevated in August 2018 as well.Patient has no complaints of abdominal pain, nausea, or vomiting. Abdominal exam benign.  Repeat total bilirubin within normal limits on 3/30.  6.  Hypoalbuminemia: Albumin noted to be 2.7. -Check prealbumin -Ensure in between meals  7.  Dementia: EEG revealed diffuse gray matter disturbance that is nonspecific, but may include dementia.  Patient at baseline alert and oriented to person and place.  Body mass index is 21.62 kg/m.   Family Communication/Anticipated D/C date and plan/Code Status   DVT prophylaxis: Lovenox ordered. Code Status: Full Code.  Family Communication: None Disposition Plan: Pending skilled nursing facility placement   Medical Consultants:    None.   Anti-Infectives:    None  Subjective:   Patient denies having any problems or issues overnight.  Objective:    Vitals:   05/22/18 1926 05/23/18  0023 05/23/18 0517 05/23/18 0546  BP: (!) 153/78  (!) 172/78 (!) 168/80  Pulse: 69  72 65  Resp: 18  17   Temp: 99.3 F (37.4 C)  98.6 F (37 C)   TempSrc: Oral   Oral   SpO2: 100%  100%   Weight:  66.4 kg    Height:        Intake/Output Summary (Last 24 hours) at 05/23/2018 0923 Last data filed at 05/23/2018 2706 Gross per 24 hour  Intake 1780.33 ml  Output 1900 ml  Net -119.67 ml   Filed Weights   05/21/18 0348 05/22/18 0355 05/23/18 0023  Weight: 68 kg 67.6 kg 66.4 kg    Exam: Constitutional: Elderly female in NAD, calm, comfortable eating breakfast Eyes: PERRL, lids and conjunctivae normal ENMT: Mucous membranes are moist. Posterior pharynx clear of any exudate or lesions.  Neck: normal, supple, no masses, no thyromegaly Respiratory: clear to auscultation bilaterally, no wheezing, no crackles. Normal respiratory effort. No accessory muscle use.  Cardiovascular: Regular rate and rhythm, no murmurs / rubs / gallops.  Trace lower extremity edema. 2+ pedal pulses. No carotid bruits.  Abdomen: no tenderness, no masses palpated. No hepatosplenomegaly. Bowel sounds positive.  Musculoskeletal: no clubbing / cyanosis. No joint deformity upper and lower extremities. Good ROM, no contractures. Normal muscle tone.  Skin: no rashes, lesions, ulcers. No induration Neurologic: CN 2-12 grossly intact. Sensation intact, DTR normal. Strength 5/5 in all 4.  Psychiatric: Normal judgment and insight. Alert and oriented x person and place. Normal mood.    Data Reviewed:   I have personally reviewed following labs and imaging studies:  Labs: Labs show the following:   Basic Metabolic Panel: Recent Labs  Lab 05/20/18 1513 05/22/18 0336 05/23/18 0431  NA 136 140 139  K 3.6 3.4* 4.0  CL 101 109 106  CO2 25 24 25   GLUCOSE 92 83 95  BUN 11 12 10   CREATININE 0.76 0.94 0.82  CALCIUM 9.2 8.2* 8.4*  MG  --  2.1  --    GFR Estimated Creatinine Clearance: 57.2 mL/min (by C-G formula based on SCr of 0.82 mg/dL). Liver Function Tests: Recent Labs  Lab 05/20/18 1513 05/23/18 0431  AST 41 29  ALT 26 19  ALKPHOS 64 55  BILITOT 2.2* 1.0  PROT 6.8  5.9*  ALBUMIN 3.4* 2.7*   Recent Labs  Lab 05/20/18 1513  LIPASE 32   No results for input(s): AMMONIA in the last 168 hours. Coagulation profile Recent Labs  Lab 05/20/18 1513  INR 1.0    CBC: Recent Labs  Lab 05/20/18 1513  WBC 8.8  NEUTROABS 6.1  HGB 13.5  HCT 44.7  MCV 80.4  PLT 273   Cardiac Enzymes: Recent Labs  Lab 05/20/18 1513 05/21/18 0407 05/22/18 0336 05/23/18 0431  CKTOTAL 922* 697* 504* 418*  TROPONINI <0.03  --   --   --    BNP (last 3 results) Recent Labs    12/15/17 1225  PROBNP 52.0   CBG: No results for input(s): GLUCAP in the last 168 hours. D-Dimer: Recent Labs    05/21/18 1240  DDIMER 1.21*   Hgb A1c: No results for input(s): HGBA1C in the last 72 hours. Lipid Profile: No results for input(s): CHOL, HDL, LDLCALC, TRIG, CHOLHDL, LDLDIRECT in the last 72 hours. Thyroid function studies: No results for input(s): TSH, T4TOTAL, T3FREE, THYROIDAB in the last 72 hours.  Invalid input(s): FREET3 Anemia work up: No results for input(s): VITAMINB12, FOLATE,  FERRITIN, TIBC, IRON, RETICCTPCT in the last 72 hours. Sepsis Labs: Recent Labs  Lab 05/20/18 1513  WBC 8.8  LATICACIDVEN 1.0    Microbiology Recent Results (from the past 240 hour(s))  Urine culture     Status: Abnormal   Collection Time: 05/20/18  2:55 PM  Result Value Ref Range Status   Specimen Description URINE, RANDOM  Final   Special Requests NONE  Final   Culture (A)  Final    <10,000 COLONIES/mL INSIGNIFICANT GROWTH Performed at [facility], [place] [address] [place],  [address]    Report Status 05/21/2018 FINAL  Final    Procedures and diagnostic studies:  Vas US Carotid  Result Date: 05/22/2018 Carotid Arterial Duplex Study Indications:       Fall, question syncpoe. Other Factors:     Dementia. Comparison Study:  Prior CV from 01/18/12 is available for comparison Performing Technologist: Sharion Dove RVS  Examination Guidelines: A complete  evaluation includes B-mode imaging, spectral Doppler, color Doppler, and power Doppler as needed of all accessible portions of each vessel. Bilateral testing is considered an integral part of a complete examination. Limited examinations for reoccurring indications may be performed as noted.  Right Carotid Findings: +----------+--------+--------+--------+--------+------------------+             PSV cm/s EDV cm/s Stenosis Describe Comments            +----------+--------+--------+--------+--------+------------------+  CCA Prox   143      10                         intimal thickening  +----------+--------+--------+--------+--------+------------------+  CCA Distal 104      8                          intimal thickening  +----------+--------+--------+--------+--------+------------------+  ICA Prox   46       6                                              +----------+--------+--------+--------+--------+------------------+  ICA Distal 39       13                                             +----------+--------+--------+--------+--------+------------------+  ECA        93       11                                             +----------+--------+--------+--------+--------+------------------+ +----------+--------+-------+--------+-------------------+             PSV cm/s EDV cms Describe Arm Pressure (mmHG)  +----------+--------+-------+--------+-------------------+  Subclavian 76                                             +----------+--------+-------+--------+-------------------+ +---------+--------+--+--------+--+  Vertebral PSV cm/s 94 EDV cm/s 10  +---------+--------+--+--------+--+  Left Carotid Findings: +----------+--------+--------+--------+--------+------------------+             PSV cm/s EDV  cm/s Stenosis Describe Comments            +----------+--------+--------+--------+--------+------------------+  CCA Prox   123      14                         intimal thickening   +----------+--------+--------+--------+--------+------------------+  CCA Distal 99       12                         intimal thickening  +----------+--------+--------+--------+--------+------------------+  ICA Prox   58       11                                             +----------+--------+--------+--------+--------+------------------+  ICA Distal 66       12                                             +----------+--------+--------+--------+--------+------------------+  ECA        99       9                                              +----------+--------+--------+--------+--------+------------------+ +----------+--------+--------+--------+-------------------+  Subclavian PSV cm/s EDV cm/s Describe Arm Pressure (mmHG)  +----------+--------+--------+--------+-------------------+             210                                             +----------+--------+--------+--------+-------------------+ +---------+--------+---+--------+--+  Vertebral PSV cm/s 104 EDV cm/s 16  +---------+--------+---+--------+--+  Summary: Right Carotid: The extracranial vessels were near-normal with only minimal wall                thickening or plaque. Left Carotid: The extracranial vessels were near-normal with only minimal wall               thickening or plaque. Vertebrals:  Bilateral vertebral arteries demonstrate antegrade flow. Subclavians: Normal flow hemodynamics were seen in bilateral subclavian              arteries. *See table(s) above for measurements and observations.     Preliminary    Vas [place] Lower Extremity Venous (dvt)  Result Date: 05/22/2018  Lower Venous Study Indications: Pitting edema.  Performing Technologist: Sharion Dove RVS  Examination Guidelines: A complete evaluation includes B-mode imaging, spectral Doppler, color Doppler, and power Doppler as needed of all accessible portions of each vessel. Bilateral testing is considered an integral part of a complete examination. Limited examinations for  reoccurring indications may be performed as noted.  Right Venous Findings: +---------+---------------+---------+-----------+----------+-------+            Compressibility Phasicity Spontaneity Properties Summary  +---------+---------------+---------+-----------+----------+-------+  CFV       Full            Yes       Yes                             +---------+---------------+---------+-----------+----------+-------+  SFJ       Full                                                      +---------+---------------+---------+-----------+----------+-------+  FV Prox   Full                                                      +---------+---------------+---------+-----------+----------+-------+  FV Mid    Full                                                      +---------+---------------+---------+-----------+----------+-------+  FV Distal Full                                                      +---------+---------------+---------+-----------+----------+-------+  PFV       Full                                                      +---------+---------------+---------+-----------+----------+-------+  POP       Full            Yes       Yes                             +---------+---------------+---------+-----------+----------+-------+  PTV       Full                                                      +---------+---------------+---------+-----------+----------+-------+  PERO      Full                                                      +---------+---------------+---------+-----------+----------+-------+  Left Venous Findings: +---------+---------------+---------+-----------+----------+-------+            Compressibility Phasicity Spontaneity Properties Summary  +---------+---------------+---------+-----------+----------+-------+  CFV       Full            Yes       Yes                             +---------+---------------+---------+-----------+----------+-------+  SFJ       Full                                                       +---------+---------------+---------+-----------+----------+-------+  FV Prox   Full                                                      +---------+---------------+---------+-----------+----------+-------+  FV Mid    Full                                                      +---------+---------------+---------+-----------+----------+-------+  FV Distal Full                                                      +---------+---------------+---------+-----------+----------+-------+  PFV       Full                                                      +---------+---------------+---------+-----------+----------+-------+  POP       Full            Yes       Yes                             +---------+---------------+---------+-----------+----------+-------+  PTV       Full                                                      +---------+---------------+---------+-----------+----------+-------+  PERO      Full                                                      +---------+---------------+---------+-----------+----------+-------+    Summary: Right: There is no evidence of deep vein thrombosis in the lower extremity. Left: There is no evidence of deep vein thrombosis in the lower extremity.  *See table(s) above for measurements and observations.    Preliminary     Medications:    aspirin EC  81 mg Oral QHS   enoxaparin (LOVENOX) injection  40 mg Subcutaneous Q24H   feeding supplement (ENSURE ENLIVE)  237 mL Oral BID BM   Continuous Infusions:   LOS: 0 days   Demetrius Barrell A Shanell Aden  [facility]   *Please refer to [email or web address], password [email or web address] to get updated schedule on who will round on this patient, as hospitalists switch teams weekly. If 7PM-7AM, please contact night-coverage at [email or web address], password [email or web address] for any overnight needs.

## 2018-05-24 DIAGNOSIS — E43 Unspecified severe protein-calorie malnutrition: Secondary | ICD-10-CM | POA: Diagnosis present

## 2018-05-24 DIAGNOSIS — R7611 Nonspecific reaction to tuberculin skin test without active tuberculosis: Secondary | ICD-10-CM | POA: Diagnosis not present

## 2018-05-24 DIAGNOSIS — M810 Age-related osteoporosis without current pathological fracture: Secondary | ICD-10-CM | POA: Diagnosis not present

## 2018-05-24 DIAGNOSIS — R5381 Other malaise: Secondary | ICD-10-CM | POA: Diagnosis not present

## 2018-05-24 DIAGNOSIS — E538 Deficiency of other specified B group vitamins: Secondary | ICD-10-CM | POA: Diagnosis not present

## 2018-05-24 DIAGNOSIS — Z7401 Bed confinement status: Secondary | ICD-10-CM | POA: Diagnosis not present

## 2018-05-24 DIAGNOSIS — I110 Hypertensive heart disease with heart failure: Secondary | ICD-10-CM | POA: Diagnosis not present

## 2018-05-24 DIAGNOSIS — F0390 Unspecified dementia without behavioral disturbance: Secondary | ICD-10-CM | POA: Diagnosis not present

## 2018-05-24 DIAGNOSIS — G308 Other Alzheimer's disease: Secondary | ICD-10-CM | POA: Diagnosis not present

## 2018-05-24 DIAGNOSIS — E8809 Other disorders of plasma-protein metabolism, not elsewhere classified: Secondary | ICD-10-CM | POA: Diagnosis not present

## 2018-05-24 DIAGNOSIS — W19XXXA Unspecified fall, initial encounter: Secondary | ICD-10-CM | POA: Diagnosis not present

## 2018-05-24 DIAGNOSIS — R55 Syncope and collapse: Secondary | ICD-10-CM | POA: Diagnosis not present

## 2018-05-24 DIAGNOSIS — R509 Fever, unspecified: Secondary | ICD-10-CM | POA: Diagnosis not present

## 2018-05-24 DIAGNOSIS — T796XXA Traumatic ischemia of muscle, initial encounter: Secondary | ICD-10-CM | POA: Diagnosis not present

## 2018-05-24 DIAGNOSIS — G309 Alzheimer's disease, unspecified: Secondary | ICD-10-CM | POA: Diagnosis not present

## 2018-05-24 DIAGNOSIS — G4700 Insomnia, unspecified: Secondary | ICD-10-CM | POA: Diagnosis not present

## 2018-05-24 DIAGNOSIS — W19XXXD Unspecified fall, subsequent encounter: Secondary | ICD-10-CM | POA: Diagnosis not present

## 2018-05-24 DIAGNOSIS — R609 Edema, unspecified: Secondary | ICD-10-CM | POA: Diagnosis not present

## 2018-05-24 DIAGNOSIS — E46 Unspecified protein-calorie malnutrition: Secondary | ICD-10-CM | POA: Diagnosis present

## 2018-05-24 DIAGNOSIS — I10 Essential (primary) hypertension: Secondary | ICD-10-CM | POA: Diagnosis not present

## 2018-05-24 DIAGNOSIS — E441 Mild protein-calorie malnutrition: Secondary | ICD-10-CM

## 2018-05-24 DIAGNOSIS — R262 Difficulty in walking, not elsewhere classified: Secondary | ICD-10-CM

## 2018-05-24 DIAGNOSIS — M2550 Pain in unspecified joint: Secondary | ICD-10-CM | POA: Diagnosis not present

## 2018-05-24 DIAGNOSIS — D519 Vitamin B12 deficiency anemia, unspecified: Secondary | ICD-10-CM | POA: Diagnosis not present

## 2018-05-24 DIAGNOSIS — M6281 Muscle weakness (generalized): Secondary | ICD-10-CM | POA: Diagnosis not present

## 2018-05-24 DIAGNOSIS — I5030 Unspecified diastolic (congestive) heart failure: Secondary | ICD-10-CM | POA: Diagnosis not present

## 2018-05-24 DIAGNOSIS — M6282 Rhabdomyolysis: Secondary | ICD-10-CM | POA: Diagnosis not present

## 2018-05-24 DIAGNOSIS — R17 Unspecified jaundice: Secondary | ICD-10-CM | POA: Diagnosis not present

## 2018-05-24 DIAGNOSIS — Z9181 History of falling: Secondary | ICD-10-CM | POA: Diagnosis not present

## 2018-05-24 DIAGNOSIS — G301 Alzheimer's disease with late onset: Secondary | ICD-10-CM | POA: Diagnosis not present

## 2018-05-24 DIAGNOSIS — R748 Abnormal levels of other serum enzymes: Secondary | ICD-10-CM | POA: Diagnosis not present

## 2018-05-24 DIAGNOSIS — R9401 Abnormal electroencephalogram [EEG]: Secondary | ICD-10-CM | POA: Diagnosis not present

## 2018-05-24 DIAGNOSIS — R600 Localized edema: Secondary | ICD-10-CM | POA: Diagnosis not present

## 2018-05-24 LAB — CK: Total CK: 361 U/L — ABNORMAL HIGH (ref 38–234)

## 2018-05-24 LAB — PREALBUMIN: Prealbumin: 14 mg/dL — ABNORMAL LOW (ref 18–38)

## 2018-05-24 MED ORDER — IRBESARTAN 75 MG PO TABS: 75.0000 mg | ORAL_TABLET | Freq: Every day | ORAL | 0 refills | Status: DC

## 2018-05-24 MED ORDER — ACETAMINOPHEN 325 MG PO TABS: 650.0000 mg | ORAL_TABLET | Freq: Four times a day (QID) | ORAL | Status: DC | PRN

## 2018-05-24 MED ORDER — IRBESARTAN 75 MG PO TABS
75.0000 mg | ORAL_TABLET | Freq: Every day | ORAL | Status: DC
  Filled 2018-05-24: qty 1

## 2018-05-24 MED ORDER — ENSURE ENLIVE PO LIQD: 237.0000 mL | Freq: Two times a day (BID) | ORAL | 12 refills | Status: DC

## 2018-05-24 NOTE — TOC Progression Note (Signed)
Transition of Care [facility]) - Progression Note    Patient Details  Name: Nicole Blackwell MRN: [id number] Date of Birth: January 04, 1936  [facility] CM/SW Contact  Candie Chroman, LCSW Phone Number: 05/24/2018, 8:10 AM  Clinical Narrative: Patient has insurance approval to discharge to [facility]F today. Sent MD a message to notify.  Expected Discharge Plan: [place] Barriers to Discharge: Continued Medical Work up, Ship broker  Expected Discharge Plan and Services Expected Discharge Plan: [place] arrangements for the past 2 months: Single Family Home                           Social Determinants of Health (SDOH) Interventions    Readmission Risk Interventions No flowsheet data found.

## 2018-05-24 NOTE — Progress Notes (Signed)
Physical Therapy Treatment Patient Details Name: Nicole Blackwell MRN: [id number] DOB: 1935-09-10 Today's Date: 05/24/2018    History of Present Illness 83 yo female presenting with evaluation for fall as pt was found on the floor at home. PMH including has a past medical history of dementia, back pain, osteoporosis, plantar fascitis, shingles, Syncope and collapse (12/13/2014), and Uterine cancer.    PT Comments    Patient received in bed, pleasantly confused but appearing more fatigued today. She required MinA for functional bed mobility and demonstrated increased posterior lean today; initially required [place] for sit to stand however this improved to MinA with repetition. Noted increased posterior lean with transfers and gait with RW. Gait limited to 30f in room due to impaired balance and fatigue this session. She was left up in chair with all needs met, chair alarm active this morning. Continue to recommend ST-SNF moving forward.    Follow Up Recommendations  SNF     Equipment Recommendations  Rolling walker with 5" wheels    Recommendations for Other Services       Precautions / Restrictions Precautions Precautions: Fall Restrictions Weight Bearing Restrictions: No    Mobility  Bed Mobility Overal bed mobility: Needs Assistance Bed Mobility: Supine to Sit     Supine to sit: Min assist     General bed mobility comments: MinA to scoot to EOB, mild posterior lean this morning   Transfers Overall transfer level: Needs assistance Equipment used: Rolling walker (2 wheeled) Transfers: Sit to/from Stand Sit to Stand: Mod assist;Min assist Stand pivot transfers: Min assist       General transfer comment: initially MOdA for sit to stand fading to MinA, required MinA for balance and safety with stand pivot; note more impairments in balance today   Ambulation/Gait Ambulation/Gait assistance: Min guard Gait Distance (Feet): 20 Feet Assistive device: Rolling walker (2  wheeled) Gait Pattern/deviations: Step-through pattern;Decreased step length - right;Decreased step length - left;Decreased dorsiflexion - right;Decreased dorsiflexion - left;Shuffle;Decreased weight shift to right;Decreased weight shift to left;Drifts right/left;Trunk flexed;Narrow base of support Gait velocity: decreased    General Gait Details: more fatigued and with stronger posterior lean this morning, gait distance limited. Cues for upright posture and improved step lengths    Stairs             Wheelchair Mobility    Modified Rankin (Stroke Patients Only)       Balance Overall balance assessment: Needs assistance Sitting-balance support: No upper extremity supported;Feet supported Sitting balance-Leahy Scale: Good Sitting balance - Comments: posterior lean with MinA to maintain upright  Postural control: Posterior lean Standing balance support: During functional activity;Bilateral upper extremity supported Standing balance-Leahy Scale: Fair Standing balance comment: released walker in static standing for pericare but required MinA for balance tdoay                             Cognition Arousal/Alertness: Awake/alert Behavior During Therapy: WFL for tasks assessed/performed Overall Cognitive Status: History of cognitive impairments - at baseline                                 General Comments: baseline dementia but very pleasant and interactive, much more awake this session       Exercises      General Comments        Pertinent Vitals/Pain Pain Assessment: No/denies pain Faces Pain Scale:  No hurt Pain Intervention(s): Monitored during session    Home Living                      Prior Function            PT Goals (current goals can now be found in the care plan section) Acute Rehab PT Goals Patient Stated Goal: did not give a goal  Potential to Achieve Goals: Good Progress towards PT goals: Progressing toward  goals    Frequency    Min 3X/week      PT Plan Current plan remains appropriate    Co-evaluation              AM-PAC PT "6 Clicks" Mobility   Outcome Measure  Help needed turning from your back to your side while in a flat bed without using bedrails?: None Help needed moving from lying on your back to sitting on the side of a flat bed without using bedrails?: A Little Help needed moving to and from a bed to a chair (including a wheelchair)?: A Little Help needed standing up from a chair using your arms (e.g., wheelchair or bedside chair)?: A Little Help needed to walk in hospital room?: A Little Help needed climbing 3-5 steps with a railing? : A Lot 6 Click Score: 18    End of Session Equipment Utilized During Treatment: Gait belt Activity Tolerance: Patient tolerated treatment well Patient left: in chair;with call bell/phone within reach;with chair alarm set   PT Visit Diagnosis: Unsteadiness on feet (R26.81);Repeated falls (R29.6);Difficulty in walking, not elsewhere classified (R26.2)     Time: 9914-4458 PT Time Calculation (min) (ACUTE ONLY): 30 min  Charges:  $Gait Training: 8-22 mins $Therapeutic Activity: 8-22 mins                     Deniece Ree PT, DPT, CBIS  Supplemental Physical Therapist [place]    Pager [phone] Acute Rehab Office [phone]

## 2018-05-24 NOTE — Discharge Summary (Addendum)
Nicole Blackwell, is a 83 y.o. female  DOB [birth year]  MRN [id number].  Admission date:  05/20/2018  Admitting Physician  Shela Leff, MD  Discharge Date:  05/24/2018   Primary MD  Billie Ruddy, MD  Recommendations for primary care physician for things to follow:   -Blood pressure    Discharge Diagnosis    Principal Problem:   Fall Active Problems:   Syncope   Diastolic CHF ([place])   Y65 deficiency   Rhabdomyolysis   Bilateral lower extremity edema   Serum total bilirubin elevated   Dementia (HCC)   Abnormal EEG   Hypoalbuminemia      Past Medical History:  Diagnosis Date   BACK PAIN    OSTEOPOROSIS    PLANTAR FASCIITIS    Positive PPD    SHINGLES    SYMPTOMATIC MENOPAUSAL/FEMALE CLIMACTERIC STATES    Syncope and collapse 12/13/2014   Uterine cancer ([place])     Past Surgical History:  Procedure Laterality Date   CATARACT EXTRACTION, BILATERAL Bilateral    DILATION AND CURETTAGE OF UTERUS  1960's X 2   "I lost 2 children to miscarriage"   LAPAROSCOPIC CHOLECYSTECTOMY     SIGMOIDOSCOPY     TONSILLECTOMY     VAGINAL HYSTERECTOMY         HPI  from the history and physical done on the day of admission:         Hospital Course:    1.  Fall/orthostatic hypotension/syncope:Patient found sitting on the floor by family, unclear how long she was on the floor.CK elevated, but trending down. No significant signs of infection.Urine culture with insignificant growth.No metabolic derangement.Troponin negativeand no complaints of chest pain. CT head and cervical spine negative for acute finding. No focal deficit on exam.  She was initially noted to be orthostatic on admission and was started on IV fluids.  Pulmonary embolus less likely as patient is not tachycardic or hypoxic.No signs  of respiratory distress.Echocardiogram cancelled per cardiology due to negative troponins and previous normal echocardiogram in 2018.  D-dimer was positive, but lower extremity dopplar negative DVT.  Carotid Dopplers negative for any significant coronary arteries stenosis. EEG consistend possibly with dementia.  Telemetry showed no significant abnormalities.  Suspect mechanical fall or orthostatic hypotension causing syncope as reason for fall possibly related to dehydration. Evaluated by PT who recommend skilled nursing facility as patient requiring use of rolling walker to ambulate approximately 120 feet needing assistance for transfers/ambulation.  Patient and family agreeable to skilled nursing facility placement.  She was approved for [facility] with the help of social work.  2.   Essential hypertension: Blood pressures were elevated during much of her hospital stay ranging from 152/63 - 184/81.  She was not on any blood pressure medications at home.  Started patient on low-dose irbesartan of 75 mg daily.  Continue to monitor and adjust medications as needed.  3.  Mild rhabdomyolysis:Resolving. Patient found sitting on the floor by family, unclear how long she was on the floor.  On admission CK922 and patient was started on normal saline IV fluids which were continued over the first 2 days while hospitalized.  Thereafter p.o. fluids were encouraged.  Has been trending downward with last CK 361 prior to discharge.  Thought to be traumatic in nature related with fall or prolonged period of being on floor.  Creatinine within limits of normal.    4.  Bilateral lower extremity edema:Bilateral lower extremities with pitting edema, warm to touch, and mild erythema. It appeared the left lower extremity is larger in size compared to the contralateral side. Cellulitis less likely given no fever or leukocytosis. Doppler vascular ultrasound of the lower extremities negative for DVT.  Suspect could  be secondary to nutrition status.  Family notes patient had previously been advised to wear TED hose.  TED hose continued during her hospitalization.  5. Elevated total bilirubin:Resolved.  T bili 2.2 on admission with remainder of LFTs normal. Lipase normal. Per chart review, T bili was elevated in August 2018 as well.Patient has no complaints of abdominal pain, nausea, or vomiting. Abdominal exam benign.  Repeat total bilirubin within normal limits on 3/30.  6.    Protein calorie malnutrition:  Moderate.  Albumin noted to be 2.7 with prealbumin 14.  Patient was placed on Ensure shakes between meals.  7.  Dementia/Alzheimer's dementia: Patient had been living at home alone with family coming into check on her.  EEG revealed diffuse gray matter disturbance that is nonspecific, but may include dementia.  Patient at baseline alert and oriented to person and place.  Felt unsafe for patient to live at home alone.  8.  Dehydration: Resolved.  Treated with IV fluids.   Follow UP  Contact information for after-discharge care    Destination    [facility] Preferred SNF .   Service:  Skilled Nursing Contact information: [address] [place] [phone]               Consults obtained: PT/OT and social work  Discharge Condition: Stable  Diet and Activity recommendation: See Discharge Instructions below  Discharge Instructions    Diet - low sodium heart healthy   Complete by:  As directed    Discharge instructions   Complete by:  As directed    Follow with Primary MD Billie Ruddy, MD in 7-10 days.  You were started on irbesartan for blood pressure during this hospitalization.  Get CBC and BMP -  checked  by Primary MD or SNF MD in 5-7 days ( we routinely change or add medications that can affect your baseline labs and fluid status, therefore we recommend that you get the mentioned basic workup next visit with your PCP, your PCP may  decide not to get them or add new tests based on their clinical decision)  Disposition: [place]  Diet: Heart Healthy    Equipment: TED hose, needs rolling walker with 5 inch wheels  Special Instructions: If you have smoked or chewed Tobacco  in the last 2 yrs please stop smoking, stop any regular Alcohol  and or any Recreational drug use.  On your next visit with your primary care physician please Get Medicines reviewed and adjusted.  Please request your Billie Ruddy, MD to go over all Hospital Tests and Procedure/Radiological results at the follow up, please get all Hospital records sent to your Prim MD by signing hospital release before you go home.  If you experience worsening of your admission symptoms, develop  shortness of breath, life threatening emergency, suicidal or homicidal thoughts you must seek medical attention immediately by calling 911 or calling your MD immediately  if symptoms less severe.  You Must read complete instructions/literature along with all the possible adverse reactions/side effects for all the Medicines you take and that have been prescribed to you. Take any new Medicines after you have completely understood and accpet all the possible adverse reactions/side effects.   Do not drive, operate heavy machinery, perform activities at heights, swimming or participation in water activities or provide baby sitting services if your were admitted for syncope or siezures until you have seen by Primary MD or a Neurologist and advised to do so again.  Do not drive when taking Pain medications.  Do not take more than prescribed Pain, Sleep and Anxiety Medications  Wear Seat belts while driving.   Please note  You were cared for by a hospitalist during your hospital stay. If you have any questions about your discharge medications or the care you received while you were in the hospital after you are discharged, you can call the unit and asked to speak with  the hospitalist on call if the hospitalist that took care of you is not available. Once you are discharged, your primary care physician will handle any further medical issues. Please note that NO REFILLS for any discharge medications will be authorized once you are discharged, as it is imperative that you return to your primary care physician (or establish a relationship with a primary care physician if you do not have one) for your aftercare needs so that they can reassess your need for medications and monitor your lab values.   Increase activity slowly   Complete by:  As directed    Equipment needed 5 inch rolling walker with fall precautions needing assistance for transfers/ambulation        Discharge Medications     Allergies as of 05/24/2018      Reactions   Penicillin G Benzathine Swelling   Did it involve swelling of the face/tongue/throat, SOB, or low BP? Unknown Did it involve sudden or severe rash/hives, skin peeling, or any reaction on the inside of your mouth or nose? Unknown Did you need to seek medical attention at a hospital or doctor's office? Unknown When did it last happen?unknown If all above answers are NO, may proceed with cephalosporin use.   Penicillins Swelling   Did it involve swelling of the face/tongue/throat, SOB, or low BP? Unknown Did it involve sudden or severe rash/hives, skin peeling, or any reaction on the inside of your mouth or nose? Unknown Did you need to seek medical attention at a hospital or doctor's office? Unknown When did it last happen?unknown If all above answers are NO, may proceed with cephalosporin use.      Medication List    STOP taking these medications   furosemide 20 MG tablet Commonly known as:  LASIX     TAKE these medications   acetaminophen 325 MG tablet Commonly known as:  TYLENOL Take 2 tablets (650 mg total) by mouth every 6 (six) hours as needed for mild pain or headache (or Fever >/= 101).   aspirin  EC 81 MG tablet Take 81 mg by mouth at bedtime.   feeding supplement (ENSURE ENLIVE) Liqd Take 237 mLs by mouth 2 (two) times daily between meals.   irbesartan 75 MG tablet Commonly known as:  AVAPRO Take 1 tablet (75 mg total) by mouth daily.  Major procedures and Radiology Reports - PLEASE review detailed and final reports for all details, in brief -   EEG 05/21/2018 IMPRESSION: This is an abnormal EEG secondary to mild posterior background slowing.  This finding may be seen with a diffuse gray matter disturbance that is etiologically nonspecific, but may include a dementia, among other possibilities.  No epileptiform activity is noted.    Ct Head Wo Contrast  Result Date: 05/20/2018 CLINICAL DATA:  Head trauma. Patient found sitting on floor. Uncertain whether the patient fell. History of dementia and back pain yesterday. EXAM: CT HEAD WITHOUT CONTRAST CT CERVICAL SPINE WITHOUT CONTRAST TECHNIQUE: Multidetector CT imaging of the head and cervical spine was performed following the standard protocol without intravenous contrast. Multiplanar CT image reconstructions of the cervical spine were also generated. COMPARISON:  Knee 318 CT head and cervical spine FINDINGS: CT HEAD FINDINGS BRAIN: There is sulcal and ventricular prominence consistent with superficial and central atrophy, stable in appearance. No intraparenchymal hemorrhage, mass effect nor midline shift. Moderate to marked degree of periventricular and deep white matter hypodensities consistent with chronic small vessel ischemic disease are identified. No acute large vascular territory infarcts. No abnormal extra-axial fluid collections. Basal cisterns are not effaced and midline. Brainstem and cerebellum demonstrate atrophy without acute abnormality. VASCULAR: No hyperdense vessels or unexpected calcifications. Atherosclerosis at the skull base. SKULL: No skull fracture. No significant scalp soft tissue swelling. SINUSES/ORBITS: The  mastoid air-cells are clear. The included paranasal sinuses are well-aerated.The included ocular globes and orbital contents are non-suspicious. OTHER: None. CT CERVICAL SPINE FINDINGS Alignment: Maintained cervical lordosis. Intact C1-C2 relationship. Intact craniocervical relationship. Skull base and vertebrae: Intact skull base. Osteoarthritis of the atlantodental interval. Maintained facet joints without jumped or perched facets. Lower cervical facet arthropathy with joint space narrowing and sclerosis. Soft tissues and spinal canal: No prevertebral fluid or swelling. No visible canal hematoma. Disc levels: Moderate disc flattening C2 through C6 with osteophytes and uncovertebral joint osteoarthritis. Bilateral uncinate spurring is noted off uncovertebral joints. No significant central canal stenosis. Minimal neural foraminal encroachment from uncinate spurs bilaterally. Upper chest: Negative. Other: None IMPRESSION: CT head: 1. Atrophy with chronic moderate to marked small vessel ischemic disease. 2. No acute intracranial abnormality. CT cervical spine: 1. Cervical spondylosis and facet arthropathy. 2. No acute cervical spine fracture or posttraumatic listhesis. Electronically Signed   By: Ashley Royalty M.D.   On: 05/20/2018 17:32   Ct Cervical Spine Wo Contrast  Result Date: 05/20/2018 CLINICAL DATA:  Head trauma. Patient found sitting on floor. Uncertain whether the patient fell. History of dementia and back pain yesterday. EXAM: CT HEAD WITHOUT CONTRAST CT CERVICAL SPINE WITHOUT CONTRAST TECHNIQUE: Multidetector CT imaging of the head and cervical spine was performed following the standard protocol without intravenous contrast. Multiplanar CT image reconstructions of the cervical spine were also generated. COMPARISON:  Knee 318 CT head and cervical spine FINDINGS: CT HEAD FINDINGS BRAIN: There is sulcal and ventricular prominence consistent with superficial and central atrophy, stable in appearance. No  intraparenchymal hemorrhage, mass effect nor midline shift. Moderate to marked degree of periventricular and deep white matter hypodensities consistent with chronic small vessel ischemic disease are identified. No acute large vascular territory infarcts. No abnormal extra-axial fluid collections. Basal cisterns are not effaced and midline. Brainstem and cerebellum demonstrate atrophy without acute abnormality. VASCULAR: No hyperdense vessels or unexpected calcifications. Atherosclerosis at the skull base. SKULL: No skull fracture. No significant scalp soft tissue swelling. SINUSES/ORBITS: The mastoid air-cells are clear. The  included paranasal sinuses are well-aerated.The included ocular globes and orbital contents are non-suspicious. OTHER: None. CT CERVICAL SPINE FINDINGS Alignment: Maintained cervical lordosis. Intact C1-C2 relationship. Intact craniocervical relationship. Skull base and vertebrae: Intact skull base. Osteoarthritis of the atlantodental interval. Maintained facet joints without jumped or perched facets. Lower cervical facet arthropathy with joint space narrowing and sclerosis. Soft tissues and spinal canal: No prevertebral fluid or swelling. No visible canal hematoma. Disc levels: Moderate disc flattening C2 through C6 with osteophytes and uncovertebral joint osteoarthritis. Bilateral uncinate spurring is noted off uncovertebral joints. No significant central canal stenosis. Minimal neural foraminal encroachment from uncinate spurs bilaterally. Upper chest: Negative. Other: None IMPRESSION: CT head: 1. Atrophy with chronic moderate to marked small vessel ischemic disease. 2. No acute intracranial abnormality. CT cervical spine: 1. Cervical spondylosis and facet arthropathy. 2. No acute cervical spine fracture or posttraumatic listhesis. Electronically Signed   By: Ashley Royalty M.D.   On: 05/20/2018 17:32   Dg Chest Port 1 View  Result Date: 05/20/2018 CLINICAL DATA:  Weakness and fall today.  EXAM: PORTABLE CHEST 1 VIEW COMPARISON:  Chest radiographs 12/13/2014 and CT 10/06/2016 FINDINGS: The cardiac silhouette is mildly enlarged. Aortic atherosclerosis is noted. Prominence of the central pulmonary arteries is similar to the prior radiographs. No airspace consolidation, edema, sizable pleural effusion, or pneumothorax is identified although the left lateral costophrenic angle was incompletely imaged. Curvilinear calcification adjacent to the right humeral head suggests calcific rotator cuff tendinopathy. IMPRESSION: No active disease. Electronically Signed   By: Logan Bores M.D.   On: 05/20/2018 15:23   Vas US Carotid  Result Date: 05/23/2018 Carotid Arterial Duplex Study Indications:       Fall, question syncpoe. Other Factors:     Dementia. Comparison Study:  Prior CV from 01/18/12 is available for comparison Performing Technologist: Sharion Dove RVS  Examination Guidelines: A complete evaluation includes B-mode imaging, spectral Doppler, color Doppler, and power Doppler as needed of all accessible portions of each vessel. Bilateral testing is considered an integral part of a complete examination. Limited examinations for reoccurring indications may be performed as noted.  Right Carotid Findings: +----------+--------+--------+--------+--------+------------------+             PSV cm/s EDV cm/s Stenosis Describe Comments            +----------+--------+--------+--------+--------+------------------+  CCA Prox   143      10                         intimal thickening  +----------+--------+--------+--------+--------+------------------+  CCA Distal 104      8                          intimal thickening  +----------+--------+--------+--------+--------+------------------+  ICA Prox   46       6                                              +----------+--------+--------+--------+--------+------------------+  ICA Distal 39       13                                              +----------+--------+--------+--------+--------+------------------+  ECA  93       11                                             +----------+--------+--------+--------+--------+------------------+ +----------+--------+-------+--------+-------------------+             PSV cm/s EDV cms Describe Arm Pressure (mmHG)  +----------+--------+-------+--------+-------------------+  Subclavian 76                                             +----------+--------+-------+--------+-------------------+ +---------+--------+--+--------+--+  Vertebral PSV cm/s 94 EDV cm/s 10  +---------+--------+--+--------+--+  Left Carotid Findings: +----------+--------+--------+--------+--------+------------------+             PSV cm/s EDV cm/s Stenosis Describe Comments            +----------+--------+--------+--------+--------+------------------+  CCA Prox   123      14                         intimal thickening  +----------+--------+--------+--------+--------+------------------+  CCA Distal 99       12                         intimal thickening  +----------+--------+--------+--------+--------+------------------+  ICA Prox   58       11                                             +----------+--------+--------+--------+--------+------------------+  ICA Distal 66       12                                             +----------+--------+--------+--------+--------+------------------+  ECA        99       9                                              +----------+--------+--------+--------+--------+------------------+ +----------+--------+--------+--------+-------------------+  Subclavian PSV cm/s EDV cm/s Describe Arm Pressure (mmHG)  +----------+--------+--------+--------+-------------------+             210                                             +----------+--------+--------+--------+-------------------+ +---------+--------+---+--------+--+  Vertebral PSV cm/s 104 EDV cm/s 16  +---------+--------+---+--------+--+  Summary: Right  Carotid: The extracranial vessels were near-normal with only minimal wall                thickening or plaque. Left Carotid: The extracranial vessels were near-normal with only minimal wall               thickening or plaque. Vertebrals:  Bilateral vertebral arteries demonstrate antegrade flow. Subclavians: Normal flow hemodynamics were seen in bilateral subclavian              arteries. *  See table(s) above for measurements and observations.  Electronically signed by Antony Contras MD on 05/23/2018 at 12:03:11 PM.    Final    Vas [place] Lower Extremity Venous (dvt)  Result Date: 05/23/2018  Lower Venous Study Indications: Pitting edema.  Performing Technologist: Sharion Dove RVS  Examination Guidelines: A complete evaluation includes B-mode imaging, spectral Doppler, color Doppler, and power Doppler as needed of all accessible portions of each vessel. Bilateral testing is considered an integral part of a complete examination. Limited examinations for reoccurring indications may be performed as noted.  Right Venous Findings: +---------+---------------+---------+-----------+----------+-------+            Compressibility Phasicity Spontaneity Properties Summary  +---------+---------------+---------+-----------+----------+-------+  CFV       Full            Yes       Yes                             +---------+---------------+---------+-----------+----------+-------+  SFJ       Full                                                      +---------+---------------+---------+-----------+----------+-------+  FV Prox   Full                                                      +---------+---------------+---------+-----------+----------+-------+  FV Mid    Full                                                      +---------+---------------+---------+-----------+----------+-------+  FV Distal Full                                                      +---------+---------------+---------+-----------+----------+-------+  PFV        Full                                                      +---------+---------------+---------+-----------+----------+-------+  POP       Full            Yes       Yes                             +---------+---------------+---------+-----------+----------+-------+  PTV       Full                                                      +---------+---------------+---------+-----------+----------+-------+  PERO      Full                                                      +---------+---------------+---------+-----------+----------+-------+  Left Venous Findings: +---------+---------------+---------+-----------+----------+-------+            Compressibility Phasicity Spontaneity Properties Summary  +---------+---------------+---------+-----------+----------+-------+  CFV       Full            Yes       Yes                             +---------+---------------+---------+-----------+----------+-------+  SFJ       Full                                                      +---------+---------------+---------+-----------+----------+-------+  FV Prox   Full                                                      +---------+---------------+---------+-----------+----------+-------+  FV Mid    Full                                                      +---------+---------------+---------+-----------+----------+-------+  FV Distal Full                                                      +---------+---------------+---------+-----------+----------+-------+  PFV       Full                                                      +---------+---------------+---------+-----------+----------+-------+  POP       Full            Yes       Yes                             +---------+---------------+---------+-----------+----------+-------+  PTV       Full                                                      +---------+---------------+---------+-----------+----------+-------+  PERO      Full                                                       +---------+---------------+---------+-----------+----------+-------+  Summary: Right: There is no evidence of deep vein thrombosis in the lower extremity. Left: There is no evidence of deep vein thrombosis in the lower extremity.  *See table(s) above for measurements and observations. Electronically signed by Ruta Hinds MD on 05/23/2018 at 8:50:05 PM.    Final     Micro Results    Recent Results (from the past 240 hour(s))  Urine culture     Status: Abnormal   Collection Time: 05/20/18  2:55 PM  Result Value Ref Range Status   Specimen Description URINE, RANDOM  Final   Special Requests NONE  Final   Culture (A)  Final    <10,000 COLONIES/mL INSIGNIFICANT GROWTH Performed at [facility], [address], [place], [place] [address]    Report Status 05/21/2018 FINAL  Final       Today   Subjective    Nicole Blackwell today has no complaints and is ready to go to skilled nursing facility when able.  Objective   Blood pressure (!) 152/63, pulse 79, temperature 98 F (36.7 C), temperature source Oral, resp. rate 18, height 5\' 9"  (1.753 m), weight 68.4 kg, SpO2 98 %.   Intake/Output Summary (Last 24 hours) at 05/24/2018 1007 Last data filed at 05/24/2018 9147 Gross per 24 hour  Intake 1099 ml  Output 1350 ml  Net -251 ml    Exam  Constitutional: Elderly female in NAD, calm, comfortable currently resting Eyes: PERRL, lids and conjunctivae normal ENMT: Mucous membranes are moist. Posterior pharynx clear of any exudate or lesions.  Neck: normal, supple, no masses, no thyromegaly Respiratory: clear to auscultation bilaterally, no wheezing, no crackles. Normal respiratory effort. No accessory muscle use.  Cardiovascular: Regular rate and rhythm, no murmurs / rubs / gallops.  Trace lower extremity edema. 2+ pedal pulses. No carotid bruits.  Abdomen: no tenderness, no masses palpated. No hepatosplenomegaly. Bowel sounds positive.  Musculoskeletal: no clubbing / cyanosis. No  joint deformity upper and lower extremities. Good ROM, no contractures. Normal muscle tone.  Skin: no rashes, lesions, ulcers. No induration Neurologic: CN 2-12 grossly intact. Sensation intact, DTR normal. Strength 5/5 in all 4.  Psychiatric: Normal judgment and insight. Alert and oriented x person and place. Normal mood.    Data Review   CBC w Diff:  Lab Results  Component Value Date   WBC 8.8 05/20/2018   HGB 13.5 05/20/2018   HCT 44.7 05/20/2018   PLT 273 05/20/2018   LYMPHOPCT 23 05/20/2018   MONOPCT 7 05/20/2018   EOSPCT 0 05/20/2018   BASOPCT 0 05/20/2018    CMP:  Lab Results  Component Value Date   NA 139 05/23/2018   K 4.0 05/23/2018   CL 106 05/23/2018   CO2 25 05/23/2018   BUN 10 05/23/2018   CREATININE 0.82 05/23/2018   PROT 5.9 (L) 05/23/2018   ALBUMIN 2.7 (L) 05/23/2018   BILITOT 1.0 05/23/2018   ALKPHOS 55 05/23/2018   AST 29 05/23/2018   ALT 19 05/23/2018  .   Total Time in preparing paper work, data evaluation and todays exam - 35 minutes  Norval Morton M.D on 05/24/2018 at 10:07 AM  [facility]   Office  [phone]

## 2018-05-24 NOTE — TOC Transition Note (Signed)
Transition of Care [facility]) - CM/SW Discharge Note   Patient Details  Name: Nicole Blackwell MRN: [id number] Date of Birth: Sep 08, 1935  [facility] CM/SW Contact:  Candie Chroman, LCSW Phone Number: 05/24/2018, 11:03 AM   Clinical Narrative: CSW facilitated patient discharge including contacting patient family (Left voicemail for daughter [name] to notify her that transport was being arranged. Did speak with her this morning to notify her that she would discharge today) and facility to confirm patient discharge plans. Clinical information faxed to facility and family agreeable with plan. CSW arranged ambulance transport via [facility] to [facility]. RN to call report prior to discharge [phone] Room 123A).  CSW will sign off for now as social work intervention is no longer needed. Please consult [place] again if new needs arise.  Final next level of care: Skilled Nursing Facility Barriers to Discharge: Barriers Resolved   Patient Goals and CMS Choice Patient states their goals for this hospitalization and ongoing recovery are:: Patient not fully oriented. CMS Medicare.gov Compare Post Acute Care list provided to:: Patient Represenative (must comment)(husband and daughter) Choice offered to / list presented to : Adult Children  Discharge Placement PASRR number recieved: 05/21/18            Patient chooses bed at: [facility] Patient to be transferred to facility by: [place] Name of family member notified: Noreene Larsson Patient and family notified of of transfer: 05/24/18  Discharge Plan and Services                          Social Determinants of Health (SDOH) Interventions     Readmission Risk Interventions No flowsheet data found.

## 2018-05-26 DIAGNOSIS — M810 Age-related osteoporosis without current pathological fracture: Secondary | ICD-10-CM | POA: Diagnosis not present

## 2018-05-26 DIAGNOSIS — G301 Alzheimer's disease with late onset: Secondary | ICD-10-CM | POA: Diagnosis not present

## 2018-05-26 DIAGNOSIS — R5381 Other malaise: Secondary | ICD-10-CM | POA: Diagnosis not present

## 2018-05-26 DIAGNOSIS — I10 Essential (primary) hypertension: Secondary | ICD-10-CM | POA: Diagnosis not present

## 2018-06-01 DIAGNOSIS — F0390 Unspecified dementia without behavioral disturbance: Secondary | ICD-10-CM | POA: Diagnosis not present

## 2018-06-01 DIAGNOSIS — M6281 Muscle weakness (generalized): Secondary | ICD-10-CM | POA: Diagnosis not present

## 2018-06-01 DIAGNOSIS — R509 Fever, unspecified: Secondary | ICD-10-CM | POA: Diagnosis not present

## 2018-06-01 DIAGNOSIS — W19XXXD Unspecified fall, subsequent encounter: Secondary | ICD-10-CM | POA: Diagnosis not present

## 2018-06-09 DIAGNOSIS — I10 Essential (primary) hypertension: Secondary | ICD-10-CM | POA: Diagnosis not present

## 2018-06-09 DIAGNOSIS — F0390 Unspecified dementia without behavioral disturbance: Secondary | ICD-10-CM | POA: Diagnosis not present

## 2018-06-09 DIAGNOSIS — R600 Localized edema: Secondary | ICD-10-CM | POA: Diagnosis not present

## 2018-06-09 DIAGNOSIS — G4700 Insomnia, unspecified: Secondary | ICD-10-CM | POA: Diagnosis not present

## 2018-06-13 ENCOUNTER — Ambulatory Visit: Payer: Medicare HMO | Admitting: Family Medicine

## 2018-06-21 DIAGNOSIS — F0390 Unspecified dementia without behavioral disturbance: Secondary | ICD-10-CM | POA: Diagnosis not present

## 2018-06-21 DIAGNOSIS — R600 Localized edema: Secondary | ICD-10-CM | POA: Diagnosis not present

## 2018-06-21 DIAGNOSIS — I10 Essential (primary) hypertension: Secondary | ICD-10-CM | POA: Diagnosis not present

## 2018-06-22 DIAGNOSIS — R55 Syncope and collapse: Secondary | ICD-10-CM | POA: Diagnosis not present

## 2018-06-22 DIAGNOSIS — F0390 Unspecified dementia without behavioral disturbance: Secondary | ICD-10-CM | POA: Diagnosis not present

## 2018-06-22 DIAGNOSIS — I5030 Unspecified diastolic (congestive) heart failure: Secondary | ICD-10-CM | POA: Diagnosis not present

## 2018-06-22 DIAGNOSIS — I10 Essential (primary) hypertension: Secondary | ICD-10-CM | POA: Diagnosis not present

## 2018-06-22 DIAGNOSIS — Z9181 History of falling: Secondary | ICD-10-CM | POA: Diagnosis not present

## 2018-06-22 DIAGNOSIS — M6281 Muscle weakness (generalized): Secondary | ICD-10-CM | POA: Diagnosis not present

## 2018-06-23 DIAGNOSIS — Z8542 Personal history of malignant neoplasm of other parts of uterus: Secondary | ICD-10-CM | POA: Diagnosis not present

## 2018-06-23 DIAGNOSIS — E440 Moderate protein-calorie malnutrition: Secondary | ICD-10-CM | POA: Diagnosis not present

## 2018-06-23 DIAGNOSIS — G308 Other Alzheimer's disease: Secondary | ICD-10-CM | POA: Diagnosis not present

## 2018-06-23 DIAGNOSIS — M6282 Rhabdomyolysis: Secondary | ICD-10-CM | POA: Diagnosis not present

## 2018-06-23 DIAGNOSIS — I110 Hypertensive heart disease with heart failure: Secondary | ICD-10-CM | POA: Diagnosis not present

## 2018-06-23 DIAGNOSIS — I5030 Unspecified diastolic (congestive) heart failure: Secondary | ICD-10-CM | POA: Diagnosis not present

## 2018-06-23 DIAGNOSIS — M810 Age-related osteoporosis without current pathological fracture: Secondary | ICD-10-CM | POA: Diagnosis not present

## 2018-06-23 DIAGNOSIS — F0280 Dementia in other diseases classified elsewhere without behavioral disturbance: Secondary | ICD-10-CM | POA: Diagnosis not present

## 2018-06-23 DIAGNOSIS — D519 Vitamin B12 deficiency anemia, unspecified: Secondary | ICD-10-CM | POA: Diagnosis not present

## 2018-06-24 ENCOUNTER — Telehealth: Payer: Self-pay | Admitting: Family Medicine

## 2018-06-24 DIAGNOSIS — I5030 Unspecified diastolic (congestive) heart failure: Secondary | ICD-10-CM | POA: Diagnosis not present

## 2018-06-24 DIAGNOSIS — M810 Age-related osteoporosis without current pathological fracture: Secondary | ICD-10-CM | POA: Diagnosis not present

## 2018-06-24 DIAGNOSIS — E440 Moderate protein-calorie malnutrition: Secondary | ICD-10-CM | POA: Diagnosis not present

## 2018-06-24 DIAGNOSIS — M6282 Rhabdomyolysis: Secondary | ICD-10-CM | POA: Diagnosis not present

## 2018-06-24 DIAGNOSIS — F0280 Dementia in other diseases classified elsewhere without behavioral disturbance: Secondary | ICD-10-CM | POA: Diagnosis not present

## 2018-06-24 DIAGNOSIS — D519 Vitamin B12 deficiency anemia, unspecified: Secondary | ICD-10-CM | POA: Diagnosis not present

## 2018-06-24 DIAGNOSIS — Z8542 Personal history of malignant neoplasm of other parts of uterus: Secondary | ICD-10-CM | POA: Diagnosis not present

## 2018-06-24 DIAGNOSIS — G308 Other Alzheimer's disease: Secondary | ICD-10-CM | POA: Diagnosis not present

## 2018-06-24 DIAGNOSIS — I110 Hypertensive heart disease with heart failure: Secondary | ICD-10-CM | POA: Diagnosis not present

## 2018-06-24 NOTE — Telephone Encounter (Signed)
Copied from [place] [phone]. Topic: Quick Communication - Home Health Verbal Orders >> Jun 24, 2018 12:43 PM Leward Quan A wrote: Caller/Agency: Hermenia Fiscal / [facility] Number: [phone] ok to LM for Tonya or ask for Tonya Requesting OT/PT/Skilled Nursing/Social Work/Speech Therapy: Skilled Nursing Frequency: 1 wk 1, 2 wk 4 and 2 prn Disease and medication management

## 2018-06-24 NOTE — Telephone Encounter (Signed)
ok 

## 2018-06-24 NOTE — Telephone Encounter (Signed)
Ok for verbal orders ?

## 2018-06-27 ENCOUNTER — Telehealth: Payer: Self-pay | Admitting: *Deleted

## 2018-06-27 NOTE — Telephone Encounter (Signed)
Spoke with Verdis Frederickson regarding pt verbal orders approved by Dr Volanda Napoleon

## 2018-06-27 NOTE — Telephone Encounter (Signed)
Copied from [place] [phone]. Topic: Quick Communication - Home Health Verbal Orders >> Jun 27, 2018  8:27 AM Virl Axe D wrote: Caller/Agency: Verdis Frederickson / [facility] Number: [phone] Jone Baseman VM Requesting OT/PT/Skilled Nursing/Social Work/Speech Therapy: PT Frequency: 1 week 1 / 2 week 6

## 2018-06-27 NOTE — Telephone Encounter (Signed)
Spoke with [place] with [facility], verbalized understanding that the verbal orders requested for pt have been approved.

## 2018-06-28 ENCOUNTER — Telehealth: Payer: Self-pay | Admitting: Family Medicine

## 2018-06-28 DIAGNOSIS — D519 Vitamin B12 deficiency anemia, unspecified: Secondary | ICD-10-CM | POA: Diagnosis not present

## 2018-06-28 DIAGNOSIS — E440 Moderate protein-calorie malnutrition: Secondary | ICD-10-CM | POA: Diagnosis not present

## 2018-06-28 DIAGNOSIS — M810 Age-related osteoporosis without current pathological fracture: Secondary | ICD-10-CM | POA: Diagnosis not present

## 2018-06-28 DIAGNOSIS — I5030 Unspecified diastolic (congestive) heart failure: Secondary | ICD-10-CM | POA: Diagnosis not present

## 2018-06-28 DIAGNOSIS — G308 Other Alzheimer's disease: Secondary | ICD-10-CM | POA: Diagnosis not present

## 2018-06-28 DIAGNOSIS — Z8542 Personal history of malignant neoplasm of other parts of uterus: Secondary | ICD-10-CM | POA: Diagnosis not present

## 2018-06-28 DIAGNOSIS — M6282 Rhabdomyolysis: Secondary | ICD-10-CM | POA: Diagnosis not present

## 2018-06-28 DIAGNOSIS — F0280 Dementia in other diseases classified elsewhere without behavioral disturbance: Secondary | ICD-10-CM | POA: Diagnosis not present

## 2018-06-28 DIAGNOSIS — I110 Hypertensive heart disease with heart failure: Secondary | ICD-10-CM | POA: Diagnosis not present

## 2018-06-28 NOTE — Telephone Encounter (Signed)
Copied from [place] [phone]. Topic: Quick Communication - See Telephone Encounter >> Jun 28, 2018  4:46 PM Antonieta Iba C wrote: CRM for notification. See Telephone encounter for: 06/28/18.  [place] [facility]/ [facility] is calling in for VO  1 week 1   2 week 4   CB: [phone]

## 2018-06-29 ENCOUNTER — Telehealth: Payer: Self-pay | Admitting: Family Medicine

## 2018-06-29 DIAGNOSIS — W19XXXA Unspecified fall, initial encounter: Secondary | ICD-10-CM

## 2018-06-29 NOTE — Telephone Encounter (Signed)
Copied from [place] [id number]. Topic: Quick Communication - Home Health Verbal Orders >> Jun 29, 2018  2:24 PM Rutherford Nail, [place] wrote: Caller/Agency: Verdis Frederickson [facility] Number: [phone] Requesting OT/PT/Skilled Nursing/Social Work/Speech Therapy: Physical therapy Frequency:   Requesting a 3-in-1 commode chair.  Would like orders faxed to number below. FAx#: [phone]

## 2018-06-29 NOTE — Telephone Encounter (Signed)
Copied from [place] [phone]. Topic: Quick Communication - Home Health Verbal Orders >> Jun 29, 2018 11:09 AM Berneta Levins wrote: Caller/Agency: Malory from [facility] Number: [phone], OK to leave a message Requesting OT/PT/Skilled Nursing/Social Work/Speech Therapy: OT and Home Health aid Frequency:  OT:  1 week 1, 2 week 4 Home Health aid:  1 week 1, 2 week 4.

## 2018-06-29 NOTE — Telephone Encounter (Signed)
Spoke with [facility], aware that verbal orders have been approved

## 2018-06-29 NOTE — Telephone Encounter (Signed)
Spoke with Nicole Blackwell with [facility] voiced understanding that verbal orders have been approved by dr Volanda Napoleon

## 2018-06-30 DIAGNOSIS — M6282 Rhabdomyolysis: Secondary | ICD-10-CM | POA: Diagnosis not present

## 2018-06-30 DIAGNOSIS — I110 Hypertensive heart disease with heart failure: Secondary | ICD-10-CM | POA: Diagnosis not present

## 2018-06-30 DIAGNOSIS — I5030 Unspecified diastolic (congestive) heart failure: Secondary | ICD-10-CM | POA: Diagnosis not present

## 2018-06-30 DIAGNOSIS — E440 Moderate protein-calorie malnutrition: Secondary | ICD-10-CM | POA: Diagnosis not present

## 2018-06-30 DIAGNOSIS — F0280 Dementia in other diseases classified elsewhere without behavioral disturbance: Secondary | ICD-10-CM | POA: Diagnosis not present

## 2018-06-30 DIAGNOSIS — M810 Age-related osteoporosis without current pathological fracture: Secondary | ICD-10-CM | POA: Diagnosis not present

## 2018-06-30 DIAGNOSIS — D519 Vitamin B12 deficiency anemia, unspecified: Secondary | ICD-10-CM | POA: Diagnosis not present

## 2018-06-30 DIAGNOSIS — Z8542 Personal history of malignant neoplasm of other parts of uterus: Secondary | ICD-10-CM | POA: Diagnosis not present

## 2018-06-30 DIAGNOSIS — G308 Other Alzheimer's disease: Secondary | ICD-10-CM | POA: Diagnosis not present

## 2018-06-30 NOTE — Telephone Encounter (Signed)
Order placed and faxed to [facility]

## 2018-06-30 NOTE — Telephone Encounter (Signed)
Ok to give orders for a 3 in 1 commode chair, please advise

## 2018-06-30 NOTE — Telephone Encounter (Signed)
That's fine

## 2018-07-04 DIAGNOSIS — F0280 Dementia in other diseases classified elsewhere without behavioral disturbance: Secondary | ICD-10-CM | POA: Diagnosis not present

## 2018-07-04 DIAGNOSIS — M810 Age-related osteoporosis without current pathological fracture: Secondary | ICD-10-CM | POA: Diagnosis not present

## 2018-07-04 DIAGNOSIS — I110 Hypertensive heart disease with heart failure: Secondary | ICD-10-CM | POA: Diagnosis not present

## 2018-07-04 DIAGNOSIS — D519 Vitamin B12 deficiency anemia, unspecified: Secondary | ICD-10-CM | POA: Diagnosis not present

## 2018-07-04 DIAGNOSIS — G308 Other Alzheimer's disease: Secondary | ICD-10-CM | POA: Diagnosis not present

## 2018-07-04 DIAGNOSIS — I5030 Unspecified diastolic (congestive) heart failure: Secondary | ICD-10-CM | POA: Diagnosis not present

## 2018-07-04 DIAGNOSIS — Z8542 Personal history of malignant neoplasm of other parts of uterus: Secondary | ICD-10-CM | POA: Diagnosis not present

## 2018-07-04 DIAGNOSIS — M6282 Rhabdomyolysis: Secondary | ICD-10-CM | POA: Diagnosis not present

## 2018-07-04 DIAGNOSIS — R296 Repeated falls: Secondary | ICD-10-CM | POA: Diagnosis not present

## 2018-07-04 DIAGNOSIS — E440 Moderate protein-calorie malnutrition: Secondary | ICD-10-CM | POA: Diagnosis not present

## 2018-07-06 DIAGNOSIS — F0280 Dementia in other diseases classified elsewhere without behavioral disturbance: Secondary | ICD-10-CM | POA: Diagnosis not present

## 2018-07-06 DIAGNOSIS — Z8542 Personal history of malignant neoplasm of other parts of uterus: Secondary | ICD-10-CM | POA: Diagnosis not present

## 2018-07-06 DIAGNOSIS — D519 Vitamin B12 deficiency anemia, unspecified: Secondary | ICD-10-CM | POA: Diagnosis not present

## 2018-07-06 DIAGNOSIS — M810 Age-related osteoporosis without current pathological fracture: Secondary | ICD-10-CM | POA: Diagnosis not present

## 2018-07-06 DIAGNOSIS — G308 Other Alzheimer's disease: Secondary | ICD-10-CM | POA: Diagnosis not present

## 2018-07-06 DIAGNOSIS — I110 Hypertensive heart disease with heart failure: Secondary | ICD-10-CM | POA: Diagnosis not present

## 2018-07-06 DIAGNOSIS — E440 Moderate protein-calorie malnutrition: Secondary | ICD-10-CM | POA: Diagnosis not present

## 2018-07-06 DIAGNOSIS — I5030 Unspecified diastolic (congestive) heart failure: Secondary | ICD-10-CM | POA: Diagnosis not present

## 2018-07-06 DIAGNOSIS — M6282 Rhabdomyolysis: Secondary | ICD-10-CM | POA: Diagnosis not present

## 2018-07-08 DIAGNOSIS — D519 Vitamin B12 deficiency anemia, unspecified: Secondary | ICD-10-CM | POA: Diagnosis not present

## 2018-07-08 DIAGNOSIS — E440 Moderate protein-calorie malnutrition: Secondary | ICD-10-CM | POA: Diagnosis not present

## 2018-07-08 DIAGNOSIS — I110 Hypertensive heart disease with heart failure: Secondary | ICD-10-CM | POA: Diagnosis not present

## 2018-07-08 DIAGNOSIS — Z8542 Personal history of malignant neoplasm of other parts of uterus: Secondary | ICD-10-CM | POA: Diagnosis not present

## 2018-07-08 DIAGNOSIS — F0280 Dementia in other diseases classified elsewhere without behavioral disturbance: Secondary | ICD-10-CM | POA: Diagnosis not present

## 2018-07-08 DIAGNOSIS — M6282 Rhabdomyolysis: Secondary | ICD-10-CM | POA: Diagnosis not present

## 2018-07-08 DIAGNOSIS — G308 Other Alzheimer's disease: Secondary | ICD-10-CM | POA: Diagnosis not present

## 2018-07-08 DIAGNOSIS — M810 Age-related osteoporosis without current pathological fracture: Secondary | ICD-10-CM | POA: Diagnosis not present

## 2018-07-08 DIAGNOSIS — I5030 Unspecified diastolic (congestive) heart failure: Secondary | ICD-10-CM | POA: Diagnosis not present

## 2018-07-11 DIAGNOSIS — I110 Hypertensive heart disease with heart failure: Secondary | ICD-10-CM | POA: Diagnosis not present

## 2018-07-11 DIAGNOSIS — Z8542 Personal history of malignant neoplasm of other parts of uterus: Secondary | ICD-10-CM | POA: Diagnosis not present

## 2018-07-11 DIAGNOSIS — I5030 Unspecified diastolic (congestive) heart failure: Secondary | ICD-10-CM | POA: Diagnosis not present

## 2018-07-11 DIAGNOSIS — E440 Moderate protein-calorie malnutrition: Secondary | ICD-10-CM | POA: Diagnosis not present

## 2018-07-11 DIAGNOSIS — D519 Vitamin B12 deficiency anemia, unspecified: Secondary | ICD-10-CM | POA: Diagnosis not present

## 2018-07-11 DIAGNOSIS — G308 Other Alzheimer's disease: Secondary | ICD-10-CM | POA: Diagnosis not present

## 2018-07-11 DIAGNOSIS — F0280 Dementia in other diseases classified elsewhere without behavioral disturbance: Secondary | ICD-10-CM | POA: Diagnosis not present

## 2018-07-11 DIAGNOSIS — M810 Age-related osteoporosis without current pathological fracture: Secondary | ICD-10-CM | POA: Diagnosis not present

## 2018-07-11 DIAGNOSIS — M6282 Rhabdomyolysis: Secondary | ICD-10-CM | POA: Diagnosis not present

## 2018-07-12 DIAGNOSIS — M6282 Rhabdomyolysis: Secondary | ICD-10-CM | POA: Diagnosis not present

## 2018-07-12 DIAGNOSIS — D519 Vitamin B12 deficiency anemia, unspecified: Secondary | ICD-10-CM | POA: Diagnosis not present

## 2018-07-12 DIAGNOSIS — E440 Moderate protein-calorie malnutrition: Secondary | ICD-10-CM | POA: Diagnosis not present

## 2018-07-12 DIAGNOSIS — G308 Other Alzheimer's disease: Secondary | ICD-10-CM | POA: Diagnosis not present

## 2018-07-12 DIAGNOSIS — M810 Age-related osteoporosis without current pathological fracture: Secondary | ICD-10-CM | POA: Diagnosis not present

## 2018-07-12 DIAGNOSIS — I5030 Unspecified diastolic (congestive) heart failure: Secondary | ICD-10-CM | POA: Diagnosis not present

## 2018-07-12 DIAGNOSIS — Z8542 Personal history of malignant neoplasm of other parts of uterus: Secondary | ICD-10-CM | POA: Diagnosis not present

## 2018-07-12 DIAGNOSIS — I110 Hypertensive heart disease with heart failure: Secondary | ICD-10-CM | POA: Diagnosis not present

## 2018-07-12 DIAGNOSIS — F0280 Dementia in other diseases classified elsewhere without behavioral disturbance: Secondary | ICD-10-CM | POA: Diagnosis not present

## 2018-07-12 NOTE — Telephone Encounter (Signed)
Pt's daughter is calling and stating pt need more days for help with her shower. Pt's daughter said she spoke with [facility]. Please advise

## 2018-07-12 NOTE — Telephone Encounter (Signed)
ok 

## 2018-07-12 NOTE — Telephone Encounter (Signed)
Ok to extend home health orders

## 2018-07-13 DIAGNOSIS — D519 Vitamin B12 deficiency anemia, unspecified: Secondary | ICD-10-CM | POA: Diagnosis not present

## 2018-07-13 DIAGNOSIS — F0280 Dementia in other diseases classified elsewhere without behavioral disturbance: Secondary | ICD-10-CM | POA: Diagnosis not present

## 2018-07-13 DIAGNOSIS — M810 Age-related osteoporosis without current pathological fracture: Secondary | ICD-10-CM | POA: Diagnosis not present

## 2018-07-13 DIAGNOSIS — I110 Hypertensive heart disease with heart failure: Secondary | ICD-10-CM | POA: Diagnosis not present

## 2018-07-13 DIAGNOSIS — E440 Moderate protein-calorie malnutrition: Secondary | ICD-10-CM | POA: Diagnosis not present

## 2018-07-13 DIAGNOSIS — I5030 Unspecified diastolic (congestive) heart failure: Secondary | ICD-10-CM | POA: Diagnosis not present

## 2018-07-13 DIAGNOSIS — Z8542 Personal history of malignant neoplasm of other parts of uterus: Secondary | ICD-10-CM | POA: Diagnosis not present

## 2018-07-13 DIAGNOSIS — M6282 Rhabdomyolysis: Secondary | ICD-10-CM | POA: Diagnosis not present

## 2018-07-13 DIAGNOSIS — G308 Other Alzheimer's disease: Secondary | ICD-10-CM | POA: Diagnosis not present

## 2018-07-14 DIAGNOSIS — F0280 Dementia in other diseases classified elsewhere without behavioral disturbance: Secondary | ICD-10-CM | POA: Diagnosis not present

## 2018-07-14 DIAGNOSIS — D519 Vitamin B12 deficiency anemia, unspecified: Secondary | ICD-10-CM | POA: Diagnosis not present

## 2018-07-14 DIAGNOSIS — G308 Other Alzheimer's disease: Secondary | ICD-10-CM | POA: Diagnosis not present

## 2018-07-14 DIAGNOSIS — M6282 Rhabdomyolysis: Secondary | ICD-10-CM | POA: Diagnosis not present

## 2018-07-14 DIAGNOSIS — I5030 Unspecified diastolic (congestive) heart failure: Secondary | ICD-10-CM | POA: Diagnosis not present

## 2018-07-14 DIAGNOSIS — M810 Age-related osteoporosis without current pathological fracture: Secondary | ICD-10-CM | POA: Diagnosis not present

## 2018-07-14 DIAGNOSIS — E440 Moderate protein-calorie malnutrition: Secondary | ICD-10-CM | POA: Diagnosis not present

## 2018-07-14 DIAGNOSIS — Z8542 Personal history of malignant neoplasm of other parts of uterus: Secondary | ICD-10-CM | POA: Diagnosis not present

## 2018-07-14 DIAGNOSIS — I110 Hypertensive heart disease with heart failure: Secondary | ICD-10-CM | POA: Diagnosis not present

## 2018-07-15 ENCOUNTER — Telehealth: Payer: Self-pay | Admitting: Family Medicine

## 2018-07-15 DIAGNOSIS — G308 Other Alzheimer's disease: Secondary | ICD-10-CM | POA: Diagnosis not present

## 2018-07-15 DIAGNOSIS — D519 Vitamin B12 deficiency anemia, unspecified: Secondary | ICD-10-CM | POA: Diagnosis not present

## 2018-07-15 DIAGNOSIS — M810 Age-related osteoporosis without current pathological fracture: Secondary | ICD-10-CM | POA: Diagnosis not present

## 2018-07-15 DIAGNOSIS — M6282 Rhabdomyolysis: Secondary | ICD-10-CM | POA: Diagnosis not present

## 2018-07-15 DIAGNOSIS — E440 Moderate protein-calorie malnutrition: Secondary | ICD-10-CM | POA: Diagnosis not present

## 2018-07-15 DIAGNOSIS — Z8542 Personal history of malignant neoplasm of other parts of uterus: Secondary | ICD-10-CM | POA: Diagnosis not present

## 2018-07-15 DIAGNOSIS — F0280 Dementia in other diseases classified elsewhere without behavioral disturbance: Secondary | ICD-10-CM | POA: Diagnosis not present

## 2018-07-15 DIAGNOSIS — I5030 Unspecified diastolic (congestive) heart failure: Secondary | ICD-10-CM | POA: Diagnosis not present

## 2018-07-15 DIAGNOSIS — I110 Hypertensive heart disease with heart failure: Secondary | ICD-10-CM | POA: Diagnosis not present

## 2018-07-15 MED ORDER — MELATONIN 3 MG PO TABS: 3.0000 mg | ORAL_TABLET | Freq: Every day | ORAL | 0 refills | Status: DC

## 2018-07-15 MED ORDER — LOSARTAN POTASSIUM 25 MG PO TABS: 25.0000 mg | ORAL_TABLET | Freq: Every day | ORAL | 0 refills | Status: DC

## 2018-07-15 NOTE — Telephone Encounter (Signed)
Copied from [place] [phone]. Topic: Quick Communication - Rx Refill/Question >> Jul 15, 2018  3:51 PM Leward Quan A wrote: Medication: Losartan 25 MG one tab daily, Melatonin 3 MG 1 tab at bedtime   Has the patient contacted their pharmacy? Yes.   (Agent: If no, request that the patient contact the pharmacy for the refill.) (Agent: If yes, when and what did the pharmacy advise?)  Preferred Pharmacy (with phone number or street name): [facility] [phone] - [place], [place] - [address] [phone] (Phone) [phone] (Fax)    Agent: Please be advised that RX refills may take up to 3 business days. We ask that you follow-up with your pharmacy.

## 2018-07-15 NOTE — Telephone Encounter (Signed)
Pt Rx was sent to pharmacy as requested

## 2018-07-15 NOTE — Telephone Encounter (Signed)
Spoke with Mal[place] with [facility] regarding pt extension for home health order, states that pt does not need extension since she is already on Home health.

## 2018-07-19 DIAGNOSIS — I5030 Unspecified diastolic (congestive) heart failure: Secondary | ICD-10-CM | POA: Diagnosis not present

## 2018-07-19 DIAGNOSIS — D519 Vitamin B12 deficiency anemia, unspecified: Secondary | ICD-10-CM | POA: Diagnosis not present

## 2018-07-19 DIAGNOSIS — I110 Hypertensive heart disease with heart failure: Secondary | ICD-10-CM | POA: Diagnosis not present

## 2018-07-19 DIAGNOSIS — Z8542 Personal history of malignant neoplasm of other parts of uterus: Secondary | ICD-10-CM | POA: Diagnosis not present

## 2018-07-19 DIAGNOSIS — G308 Other Alzheimer's disease: Secondary | ICD-10-CM | POA: Diagnosis not present

## 2018-07-19 DIAGNOSIS — M810 Age-related osteoporosis without current pathological fracture: Secondary | ICD-10-CM | POA: Diagnosis not present

## 2018-07-19 DIAGNOSIS — E440 Moderate protein-calorie malnutrition: Secondary | ICD-10-CM | POA: Diagnosis not present

## 2018-07-19 DIAGNOSIS — F0280 Dementia in other diseases classified elsewhere without behavioral disturbance: Secondary | ICD-10-CM | POA: Diagnosis not present

## 2018-07-19 DIAGNOSIS — M6282 Rhabdomyolysis: Secondary | ICD-10-CM | POA: Diagnosis not present

## 2018-07-20 ENCOUNTER — Telehealth: Payer: Self-pay | Admitting: *Deleted

## 2018-07-20 DIAGNOSIS — G308 Other Alzheimer's disease: Secondary | ICD-10-CM | POA: Diagnosis not present

## 2018-07-20 DIAGNOSIS — I5030 Unspecified diastolic (congestive) heart failure: Secondary | ICD-10-CM | POA: Diagnosis not present

## 2018-07-20 DIAGNOSIS — M810 Age-related osteoporosis without current pathological fracture: Secondary | ICD-10-CM | POA: Diagnosis not present

## 2018-07-20 DIAGNOSIS — E440 Moderate protein-calorie malnutrition: Secondary | ICD-10-CM | POA: Diagnosis not present

## 2018-07-20 DIAGNOSIS — I110 Hypertensive heart disease with heart failure: Secondary | ICD-10-CM | POA: Diagnosis not present

## 2018-07-20 DIAGNOSIS — Z8542 Personal history of malignant neoplasm of other parts of uterus: Secondary | ICD-10-CM | POA: Diagnosis not present

## 2018-07-20 DIAGNOSIS — F0280 Dementia in other diseases classified elsewhere without behavioral disturbance: Secondary | ICD-10-CM | POA: Diagnosis not present

## 2018-07-20 DIAGNOSIS — D519 Vitamin B12 deficiency anemia, unspecified: Secondary | ICD-10-CM | POA: Diagnosis not present

## 2018-07-20 DIAGNOSIS — M6282 Rhabdomyolysis: Secondary | ICD-10-CM | POA: Diagnosis not present

## 2018-07-20 NOTE — Telephone Encounter (Signed)
Copied from [place] [phone]. Topic: General - Other >> Jul 20, 2018  2:18 PM Alanda Slim E wrote: Reason for CRM: Pt daughter called and would like Dr. Volanda Napoleon assistant to call about the Power of Attorney paperwork/ please advise

## 2018-07-20 NOTE — Telephone Encounter (Signed)
Information noted and changed.

## 2018-07-20 NOTE — Telephone Encounter (Signed)
FYI - From now on [facility] not [facility]/  [place], [place] [phone] (Phone) [phone] (Fax)

## 2018-07-21 NOTE — Telephone Encounter (Signed)
Power of Attorney paperwork has been mailed out to pt address as requested.

## 2018-07-22 DIAGNOSIS — I5030 Unspecified diastolic (congestive) heart failure: Secondary | ICD-10-CM | POA: Diagnosis not present

## 2018-07-22 DIAGNOSIS — E440 Moderate protein-calorie malnutrition: Secondary | ICD-10-CM | POA: Diagnosis not present

## 2018-07-22 DIAGNOSIS — G308 Other Alzheimer's disease: Secondary | ICD-10-CM | POA: Diagnosis not present

## 2018-07-22 DIAGNOSIS — M810 Age-related osteoporosis without current pathological fracture: Secondary | ICD-10-CM | POA: Diagnosis not present

## 2018-07-22 DIAGNOSIS — M6282 Rhabdomyolysis: Secondary | ICD-10-CM | POA: Diagnosis not present

## 2018-07-22 DIAGNOSIS — F0280 Dementia in other diseases classified elsewhere without behavioral disturbance: Secondary | ICD-10-CM | POA: Diagnosis not present

## 2018-07-22 DIAGNOSIS — D519 Vitamin B12 deficiency anemia, unspecified: Secondary | ICD-10-CM | POA: Diagnosis not present

## 2018-07-22 DIAGNOSIS — I110 Hypertensive heart disease with heart failure: Secondary | ICD-10-CM | POA: Diagnosis not present

## 2018-07-22 DIAGNOSIS — Z8542 Personal history of malignant neoplasm of other parts of uterus: Secondary | ICD-10-CM | POA: Diagnosis not present

## 2018-07-25 ENCOUNTER — Telehealth: Payer: Self-pay | Admitting: Family Medicine

## 2018-07-25 DIAGNOSIS — M810 Age-related osteoporosis without current pathological fracture: Secondary | ICD-10-CM | POA: Diagnosis not present

## 2018-07-25 DIAGNOSIS — I110 Hypertensive heart disease with heart failure: Secondary | ICD-10-CM | POA: Diagnosis not present

## 2018-07-25 DIAGNOSIS — F0280 Dementia in other diseases classified elsewhere without behavioral disturbance: Secondary | ICD-10-CM | POA: Diagnosis not present

## 2018-07-25 DIAGNOSIS — Z8542 Personal history of malignant neoplasm of other parts of uterus: Secondary | ICD-10-CM | POA: Diagnosis not present

## 2018-07-25 DIAGNOSIS — D519 Vitamin B12 deficiency anemia, unspecified: Secondary | ICD-10-CM | POA: Diagnosis not present

## 2018-07-25 DIAGNOSIS — I5030 Unspecified diastolic (congestive) heart failure: Secondary | ICD-10-CM | POA: Diagnosis not present

## 2018-07-25 DIAGNOSIS — E440 Moderate protein-calorie malnutrition: Secondary | ICD-10-CM | POA: Diagnosis not present

## 2018-07-25 DIAGNOSIS — G308 Other Alzheimer's disease: Secondary | ICD-10-CM | POA: Diagnosis not present

## 2018-07-25 DIAGNOSIS — M6282 Rhabdomyolysis: Secondary | ICD-10-CM | POA: Diagnosis not present

## 2018-07-25 NOTE — Telephone Encounter (Signed)
Copied from [place] [phone]. Topic: Quick Communication - See Telephone Encounter >> Jul 25, 2018  3:50 PM Marin Olp L wrote: CRM for notification. See Telephone encounter for: 07/25/18. Rodena Piety with [facility] says patient missed visit on May 27th.

## 2018-07-25 NOTE — Telephone Encounter (Signed)
ok 

## 2018-07-25 NOTE — Telephone Encounter (Signed)
FYI

## 2018-07-27 DIAGNOSIS — I5030 Unspecified diastolic (congestive) heart failure: Secondary | ICD-10-CM | POA: Diagnosis not present

## 2018-07-27 DIAGNOSIS — Z8542 Personal history of malignant neoplasm of other parts of uterus: Secondary | ICD-10-CM | POA: Diagnosis not present

## 2018-07-27 DIAGNOSIS — M6282 Rhabdomyolysis: Secondary | ICD-10-CM | POA: Diagnosis not present

## 2018-07-27 DIAGNOSIS — G308 Other Alzheimer's disease: Secondary | ICD-10-CM | POA: Diagnosis not present

## 2018-07-27 DIAGNOSIS — D519 Vitamin B12 deficiency anemia, unspecified: Secondary | ICD-10-CM | POA: Diagnosis not present

## 2018-07-27 DIAGNOSIS — E440 Moderate protein-calorie malnutrition: Secondary | ICD-10-CM | POA: Diagnosis not present

## 2018-07-27 DIAGNOSIS — F0280 Dementia in other diseases classified elsewhere without behavioral disturbance: Secondary | ICD-10-CM | POA: Diagnosis not present

## 2018-07-27 DIAGNOSIS — I110 Hypertensive heart disease with heart failure: Secondary | ICD-10-CM | POA: Diagnosis not present

## 2018-07-27 DIAGNOSIS — M810 Age-related osteoporosis without current pathological fracture: Secondary | ICD-10-CM | POA: Diagnosis not present

## 2018-07-28 ENCOUNTER — Telehealth: Payer: Self-pay | Admitting: Family Medicine

## 2018-07-28 DIAGNOSIS — E440 Moderate protein-calorie malnutrition: Secondary | ICD-10-CM | POA: Diagnosis not present

## 2018-07-28 DIAGNOSIS — M6282 Rhabdomyolysis: Secondary | ICD-10-CM | POA: Diagnosis not present

## 2018-07-28 DIAGNOSIS — F0280 Dementia in other diseases classified elsewhere without behavioral disturbance: Secondary | ICD-10-CM | POA: Diagnosis not present

## 2018-07-28 DIAGNOSIS — M810 Age-related osteoporosis without current pathological fracture: Secondary | ICD-10-CM | POA: Diagnosis not present

## 2018-07-28 DIAGNOSIS — I5030 Unspecified diastolic (congestive) heart failure: Secondary | ICD-10-CM | POA: Diagnosis not present

## 2018-07-28 DIAGNOSIS — I110 Hypertensive heart disease with heart failure: Secondary | ICD-10-CM | POA: Diagnosis not present

## 2018-07-28 DIAGNOSIS — Z8542 Personal history of malignant neoplasm of other parts of uterus: Secondary | ICD-10-CM | POA: Diagnosis not present

## 2018-07-28 DIAGNOSIS — G308 Other Alzheimer's disease: Secondary | ICD-10-CM | POA: Diagnosis not present

## 2018-07-28 DIAGNOSIS — D519 Vitamin B12 deficiency anemia, unspecified: Secondary | ICD-10-CM | POA: Diagnosis not present

## 2018-07-28 NOTE — Telephone Encounter (Unsigned)
Copied from [place] [phone]. Topic: Quick Communication - Home Health Verbal Orders >> Jul 28, 2018  4:15 PM Celene Kras A wrote: Caller/Agency: Mallory/ [facility] Callback Number: [phone]  Requesting OT/PT/Skilled Nursing/Social Work/Speech Therapy: home health OT Frequency: 1x for 1wk

## 2018-07-29 ENCOUNTER — Telehealth: Payer: Self-pay | Admitting: Family Medicine

## 2018-07-29 NOTE — Telephone Encounter (Signed)
Nicole Blackwell with [facility] calling to check on status of home health orders. Needs asap for next week

## 2018-07-29 NOTE — Telephone Encounter (Signed)
Copied from [place] [phone]. Topic: Quick Communication - Home Health Verbal Orders >> Jul 29, 2018  4:48 PM Nils Flack wrote: Caller/Agency: tom - [facility]  Callback Number: [phone]  Requesting OT/PT/Skilled Nursing/Social Work/Speech Therapy: pt  Frequency: Tom [facility] called - pt has been non compliant this week, pt missed 2 appts this week.

## 2018-08-01 NOTE — Telephone Encounter (Signed)
ok 

## 2018-08-01 NOTE — Telephone Encounter (Signed)
Spoke with Nicole Blackwell states that he is notfying DR Volanda Napoleon that pr missed 2 appointments last week

## 2018-08-01 NOTE — Telephone Encounter (Signed)
Called LVM for Mallory with [facility] regarding approval for the verbal orders requested

## 2018-08-01 NOTE — Telephone Encounter (Signed)
Mallory calling back for verbal.

## 2018-08-02 DIAGNOSIS — M6282 Rhabdomyolysis: Secondary | ICD-10-CM | POA: Diagnosis not present

## 2018-08-02 DIAGNOSIS — I110 Hypertensive heart disease with heart failure: Secondary | ICD-10-CM | POA: Diagnosis not present

## 2018-08-02 DIAGNOSIS — G308 Other Alzheimer's disease: Secondary | ICD-10-CM | POA: Diagnosis not present

## 2018-08-02 DIAGNOSIS — F0280 Dementia in other diseases classified elsewhere without behavioral disturbance: Secondary | ICD-10-CM | POA: Diagnosis not present

## 2018-08-02 DIAGNOSIS — I5030 Unspecified diastolic (congestive) heart failure: Secondary | ICD-10-CM | POA: Diagnosis not present

## 2018-08-02 DIAGNOSIS — Z8542 Personal history of malignant neoplasm of other parts of uterus: Secondary | ICD-10-CM | POA: Diagnosis not present

## 2018-08-02 DIAGNOSIS — E440 Moderate protein-calorie malnutrition: Secondary | ICD-10-CM | POA: Diagnosis not present

## 2018-08-02 DIAGNOSIS — M810 Age-related osteoporosis without current pathological fracture: Secondary | ICD-10-CM | POA: Diagnosis not present

## 2018-08-02 DIAGNOSIS — D519 Vitamin B12 deficiency anemia, unspecified: Secondary | ICD-10-CM | POA: Diagnosis not present

## 2018-08-03 DIAGNOSIS — G308 Other Alzheimer's disease: Secondary | ICD-10-CM | POA: Diagnosis not present

## 2018-08-03 DIAGNOSIS — M810 Age-related osteoporosis without current pathological fracture: Secondary | ICD-10-CM | POA: Diagnosis not present

## 2018-08-03 DIAGNOSIS — F0280 Dementia in other diseases classified elsewhere without behavioral disturbance: Secondary | ICD-10-CM | POA: Diagnosis not present

## 2018-08-03 DIAGNOSIS — Z8542 Personal history of malignant neoplasm of other parts of uterus: Secondary | ICD-10-CM | POA: Diagnosis not present

## 2018-08-03 DIAGNOSIS — D519 Vitamin B12 deficiency anemia, unspecified: Secondary | ICD-10-CM | POA: Diagnosis not present

## 2018-08-03 DIAGNOSIS — E440 Moderate protein-calorie malnutrition: Secondary | ICD-10-CM | POA: Diagnosis not present

## 2018-08-03 DIAGNOSIS — I110 Hypertensive heart disease with heart failure: Secondary | ICD-10-CM | POA: Diagnosis not present

## 2018-08-03 DIAGNOSIS — M6282 Rhabdomyolysis: Secondary | ICD-10-CM | POA: Diagnosis not present

## 2018-08-03 DIAGNOSIS — I5030 Unspecified diastolic (congestive) heart failure: Secondary | ICD-10-CM | POA: Diagnosis not present

## 2018-08-04 ENCOUNTER — Encounter: Payer: Self-pay | Admitting: Family Medicine

## 2018-08-04 ENCOUNTER — Ambulatory Visit (INDEPENDENT_AMBULATORY_CARE_PROVIDER_SITE_OTHER): Payer: Medicare HMO | Admitting: Family Medicine

## 2018-08-04 ENCOUNTER — Other Ambulatory Visit: Payer: Self-pay

## 2018-08-04 ENCOUNTER — Telehealth: Payer: Self-pay | Admitting: Family Medicine

## 2018-08-04 DIAGNOSIS — G308 Other Alzheimer's disease: Secondary | ICD-10-CM | POA: Diagnosis not present

## 2018-08-04 DIAGNOSIS — L602 Onychogryphosis: Secondary | ICD-10-CM | POA: Diagnosis not present

## 2018-08-04 DIAGNOSIS — I10 Essential (primary) hypertension: Secondary | ICD-10-CM | POA: Diagnosis not present

## 2018-08-04 DIAGNOSIS — F0390 Unspecified dementia without behavioral disturbance: Secondary | ICD-10-CM | POA: Diagnosis not present

## 2018-08-04 DIAGNOSIS — F0280 Dementia in other diseases classified elsewhere without behavioral disturbance: Secondary | ICD-10-CM | POA: Diagnosis not present

## 2018-08-04 DIAGNOSIS — M810 Age-related osteoporosis without current pathological fracture: Secondary | ICD-10-CM | POA: Diagnosis not present

## 2018-08-04 DIAGNOSIS — I110 Hypertensive heart disease with heart failure: Secondary | ICD-10-CM | POA: Diagnosis not present

## 2018-08-04 DIAGNOSIS — D519 Vitamin B12 deficiency anemia, unspecified: Secondary | ICD-10-CM | POA: Diagnosis not present

## 2018-08-04 DIAGNOSIS — E538 Deficiency of other specified B group vitamins: Secondary | ICD-10-CM | POA: Diagnosis not present

## 2018-08-04 DIAGNOSIS — Z8542 Personal history of malignant neoplasm of other parts of uterus: Secondary | ICD-10-CM | POA: Diagnosis not present

## 2018-08-04 DIAGNOSIS — I5030 Unspecified diastolic (congestive) heart failure: Secondary | ICD-10-CM | POA: Diagnosis not present

## 2018-08-04 DIAGNOSIS — M6282 Rhabdomyolysis: Secondary | ICD-10-CM | POA: Diagnosis not present

## 2018-08-04 DIAGNOSIS — E440 Moderate protein-calorie malnutrition: Secondary | ICD-10-CM | POA: Diagnosis not present

## 2018-08-04 MED ORDER — DONEPEZIL HCL 5 MG PO TABS: 5.0000 mg | ORAL_TABLET | Freq: Every day | ORAL | 3 refills | Status: DC

## 2018-08-04 NOTE — Telephone Encounter (Signed)
Copied from [place] [phone]. Topic: Quick Communication - Home Health Verbal Orders >> Aug 04, 2018  8:49 AM Pauline Good wrote: Caller/Agency: Mallory/[facility] Number: [phone] Requesting OT/PT/Skilled Nursing/Social Work/Speech Therapy:Home Health  Frequency: Home health 2w 1w and 1x 1w Home health aide 2x 2w

## 2018-08-04 NOTE — Progress Notes (Signed)
Virtual Visit via Telephone Note  I connected with Nicole Blackwell on 08/04/18 at  1:00 PM EDT by telephone and verified that I am speaking with the correct person using two identifiers.   I discussed the limitations, risks, security and privacy concerns of performing an evaluation and management service by telephone and the availability of in person appointments. I also discussed with the patient that there may be a patient responsible charge related to this service. The patient expressed understanding and agreed to proceed.  Location patient: home Location provider: work or home office Participants present for the call: patient, provider, pt's daughter Patient did not have a visit in the prior 7 days to address this/these issue(s).   History of Present Illness: Pt is an 83 yo female with pmh sig for dementia, HTN, B12 deficiency, osteoporosis, diastolic CHF.    Pt states she is doing well.  She has no complaints.  Her daughter is at the house with her.  LE edema has greatly improved.  Pt's daughter stopped giving pt lasix 2/2 frequent urination.  Pt doing therapy with HH, but per daughter when they leave, pt forgets to do the exercises.  Pt's memory is about the same.  Pt taking losartan for blood pressure.Pt recently confused, wanted to go to their old house, did not realize she was at her home.  Pt's appetite has improved some.  No longer drinking ensure, drinking lactaid milk.  Pt fell in the middle of the night.  Pt's daughter found her at the foot of the bed.  No injury noted.  Pt wearing depends since leaving rehab, typically able to make it to the restroom.  Pt was in rehab from March 31-April 29 after hospitalization for a fall.  Pt's daughter now living with her.  Pt's daughter mentions she has been in contact with [facility].  She completed the initial assessment and paperwork.  Pt's daughter was hoping pt could be seen by this provider and [facility].  Pt's daughter requesting toenail  trimming.  Previously seen by podiatry.   Observations/Objective: Patient sounds cheerful and well on the phone. I do not appreciate any SOB. Speech and thought processing are grossly intact. Patient reported vitals:  Assessment and Plan: Dementia without behavioral disturbance, unspecified dementia type ([place]) -pt's daughter offered info for area support groups -will start Aricept.  In the past pt was rx'd this med, but was not taking it. - Plan: donepezil (ARICEPT) 5 MG tablet  Overgrown toenails  -f/u with [facility]  Hypertension, essential -continue losartan  -advised to check bp at home -continue [facility]  Vitamin B12 deficiency -discussed taking OTC B12 supplements until pt can come in for B12 injection  Pt's daughter advised that [facility] typically takes over all of pt's care.  Follow Up Instructions: F/u prn in 1 month  I did not refer this patient for an OV in the next 24 hours for this/these issue(s).  I discussed the assessment and treatment plan with the patient. The patient was provided an opportunity to ask questions and all were answered. The patient agreed with the plan and demonstrated an understanding of the instructions.   The patient was advised to call back or seek an in-person evaluation if the symptoms worsen or if the condition fails to improve as anticipated.  I provided 22 minutes of non-face-to-face time during this encounter.   Nicole Ruddy, MD

## 2018-08-05 NOTE — Telephone Encounter (Signed)
Message left for Mallory for okay for verbal orders per Dr. Cain Saupe

## 2018-08-05 NOTE — Telephone Encounter (Signed)
ok 

## 2018-08-05 NOTE — Telephone Encounter (Signed)
Nicole Blackwell is calling back, asking for call about orders

## 2018-08-08 DIAGNOSIS — F0280 Dementia in other diseases classified elsewhere without behavioral disturbance: Secondary | ICD-10-CM | POA: Diagnosis not present

## 2018-08-08 DIAGNOSIS — M810 Age-related osteoporosis without current pathological fracture: Secondary | ICD-10-CM | POA: Diagnosis not present

## 2018-08-08 DIAGNOSIS — Z8542 Personal history of malignant neoplasm of other parts of uterus: Secondary | ICD-10-CM | POA: Diagnosis not present

## 2018-08-08 DIAGNOSIS — I5030 Unspecified diastolic (congestive) heart failure: Secondary | ICD-10-CM | POA: Diagnosis not present

## 2018-08-08 DIAGNOSIS — I110 Hypertensive heart disease with heart failure: Secondary | ICD-10-CM | POA: Diagnosis not present

## 2018-08-08 DIAGNOSIS — D519 Vitamin B12 deficiency anemia, unspecified: Secondary | ICD-10-CM | POA: Diagnosis not present

## 2018-08-08 DIAGNOSIS — E440 Moderate protein-calorie malnutrition: Secondary | ICD-10-CM | POA: Diagnosis not present

## 2018-08-08 DIAGNOSIS — G308 Other Alzheimer's disease: Secondary | ICD-10-CM | POA: Diagnosis not present

## 2018-08-08 DIAGNOSIS — M6282 Rhabdomyolysis: Secondary | ICD-10-CM | POA: Diagnosis not present

## 2018-08-08 NOTE — Telephone Encounter (Signed)
Copied from [place] [phone]. Topic: General - Call Back - No Documentation    >> Aug 01, 2018  9:11 AM Erick Blinks wrote: Reason for CRM: Pt's daughter [name] is requesting a call back  Best Contact: [phone] >> Aug 04, 2018  5:27 PM Loma [place] wrote: Wanting call back about dosage of OTC B-12

## 2018-08-08 NOTE — Telephone Encounter (Signed)
Pt had a virtual visit with dr Volanda Napoleon on 08/04/2018

## 2018-08-09 DIAGNOSIS — I110 Hypertensive heart disease with heart failure: Secondary | ICD-10-CM | POA: Diagnosis not present

## 2018-08-09 DIAGNOSIS — F0280 Dementia in other diseases classified elsewhere without behavioral disturbance: Secondary | ICD-10-CM | POA: Diagnosis not present

## 2018-08-09 DIAGNOSIS — M6282 Rhabdomyolysis: Secondary | ICD-10-CM | POA: Diagnosis not present

## 2018-08-09 DIAGNOSIS — Z8542 Personal history of malignant neoplasm of other parts of uterus: Secondary | ICD-10-CM | POA: Diagnosis not present

## 2018-08-09 DIAGNOSIS — D519 Vitamin B12 deficiency anemia, unspecified: Secondary | ICD-10-CM | POA: Diagnosis not present

## 2018-08-09 DIAGNOSIS — I5030 Unspecified diastolic (congestive) heart failure: Secondary | ICD-10-CM | POA: Diagnosis not present

## 2018-08-09 DIAGNOSIS — E440 Moderate protein-calorie malnutrition: Secondary | ICD-10-CM | POA: Diagnosis not present

## 2018-08-09 DIAGNOSIS — G308 Other Alzheimer's disease: Secondary | ICD-10-CM | POA: Diagnosis not present

## 2018-08-09 DIAGNOSIS — M810 Age-related osteoporosis without current pathological fracture: Secondary | ICD-10-CM | POA: Diagnosis not present

## 2018-08-10 DIAGNOSIS — D519 Vitamin B12 deficiency anemia, unspecified: Secondary | ICD-10-CM | POA: Diagnosis not present

## 2018-08-10 DIAGNOSIS — M810 Age-related osteoporosis without current pathological fracture: Secondary | ICD-10-CM | POA: Diagnosis not present

## 2018-08-10 DIAGNOSIS — Z8542 Personal history of malignant neoplasm of other parts of uterus: Secondary | ICD-10-CM | POA: Diagnosis not present

## 2018-08-10 DIAGNOSIS — I5030 Unspecified diastolic (congestive) heart failure: Secondary | ICD-10-CM | POA: Diagnosis not present

## 2018-08-10 DIAGNOSIS — E440 Moderate protein-calorie malnutrition: Secondary | ICD-10-CM | POA: Diagnosis not present

## 2018-08-10 DIAGNOSIS — I110 Hypertensive heart disease with heart failure: Secondary | ICD-10-CM | POA: Diagnosis not present

## 2018-08-10 DIAGNOSIS — G308 Other Alzheimer's disease: Secondary | ICD-10-CM | POA: Diagnosis not present

## 2018-08-10 DIAGNOSIS — F0280 Dementia in other diseases classified elsewhere without behavioral disturbance: Secondary | ICD-10-CM | POA: Diagnosis not present

## 2018-08-10 DIAGNOSIS — M6282 Rhabdomyolysis: Secondary | ICD-10-CM | POA: Diagnosis not present

## 2018-08-11 DIAGNOSIS — M6282 Rhabdomyolysis: Secondary | ICD-10-CM | POA: Diagnosis not present

## 2018-08-11 DIAGNOSIS — M810 Age-related osteoporosis without current pathological fracture: Secondary | ICD-10-CM | POA: Diagnosis not present

## 2018-08-11 DIAGNOSIS — G308 Other Alzheimer's disease: Secondary | ICD-10-CM | POA: Diagnosis not present

## 2018-08-11 DIAGNOSIS — E440 Moderate protein-calorie malnutrition: Secondary | ICD-10-CM | POA: Diagnosis not present

## 2018-08-11 DIAGNOSIS — I5030 Unspecified diastolic (congestive) heart failure: Secondary | ICD-10-CM | POA: Diagnosis not present

## 2018-08-11 DIAGNOSIS — Z8542 Personal history of malignant neoplasm of other parts of uterus: Secondary | ICD-10-CM | POA: Diagnosis not present

## 2018-08-11 DIAGNOSIS — I110 Hypertensive heart disease with heart failure: Secondary | ICD-10-CM | POA: Diagnosis not present

## 2018-08-11 DIAGNOSIS — F0280 Dementia in other diseases classified elsewhere without behavioral disturbance: Secondary | ICD-10-CM | POA: Diagnosis not present

## 2018-08-11 DIAGNOSIS — D519 Vitamin B12 deficiency anemia, unspecified: Secondary | ICD-10-CM | POA: Diagnosis not present

## 2018-08-15 DIAGNOSIS — I110 Hypertensive heart disease with heart failure: Secondary | ICD-10-CM | POA: Diagnosis not present

## 2018-08-15 DIAGNOSIS — D519 Vitamin B12 deficiency anemia, unspecified: Secondary | ICD-10-CM | POA: Diagnosis not present

## 2018-08-15 DIAGNOSIS — E440 Moderate protein-calorie malnutrition: Secondary | ICD-10-CM | POA: Diagnosis not present

## 2018-08-15 DIAGNOSIS — G308 Other Alzheimer's disease: Secondary | ICD-10-CM | POA: Diagnosis not present

## 2018-08-15 DIAGNOSIS — I5030 Unspecified diastolic (congestive) heart failure: Secondary | ICD-10-CM | POA: Diagnosis not present

## 2018-08-15 DIAGNOSIS — M6282 Rhabdomyolysis: Secondary | ICD-10-CM | POA: Diagnosis not present

## 2018-08-15 DIAGNOSIS — Z8542 Personal history of malignant neoplasm of other parts of uterus: Secondary | ICD-10-CM | POA: Diagnosis not present

## 2018-08-15 DIAGNOSIS — F0280 Dementia in other diseases classified elsewhere without behavioral disturbance: Secondary | ICD-10-CM | POA: Diagnosis not present

## 2018-08-15 DIAGNOSIS — M810 Age-related osteoporosis without current pathological fracture: Secondary | ICD-10-CM | POA: Diagnosis not present

## 2018-08-16 ENCOUNTER — Telehealth: Payer: Self-pay

## 2018-08-16 NOTE — Telephone Encounter (Signed)
Copied from [place] [phone]. Topic: General - Other >> Aug 16, 2018  3:51 PM Rainey Pines A wrote: Pilar Plate  from [facility] called to get verbal orders to continue assisting patient with PT for 1w5. Junie Panning would like a callback at [phone]

## 2018-08-17 DIAGNOSIS — E440 Moderate protein-calorie malnutrition: Secondary | ICD-10-CM | POA: Diagnosis not present

## 2018-08-17 DIAGNOSIS — G308 Other Alzheimer's disease: Secondary | ICD-10-CM | POA: Diagnosis not present

## 2018-08-17 DIAGNOSIS — D519 Vitamin B12 deficiency anemia, unspecified: Secondary | ICD-10-CM | POA: Diagnosis not present

## 2018-08-17 DIAGNOSIS — I5030 Unspecified diastolic (congestive) heart failure: Secondary | ICD-10-CM | POA: Diagnosis not present

## 2018-08-17 DIAGNOSIS — I110 Hypertensive heart disease with heart failure: Secondary | ICD-10-CM | POA: Diagnosis not present

## 2018-08-17 DIAGNOSIS — M6282 Rhabdomyolysis: Secondary | ICD-10-CM | POA: Diagnosis not present

## 2018-08-17 DIAGNOSIS — F0280 Dementia in other diseases classified elsewhere without behavioral disturbance: Secondary | ICD-10-CM | POA: Diagnosis not present

## 2018-08-17 DIAGNOSIS — M810 Age-related osteoporosis without current pathological fracture: Secondary | ICD-10-CM | POA: Diagnosis not present

## 2018-08-17 DIAGNOSIS — Z8542 Personal history of malignant neoplasm of other parts of uterus: Secondary | ICD-10-CM | POA: Diagnosis not present

## 2018-08-17 NOTE — Telephone Encounter (Signed)
[facility] is calling again to get verbal orders.  Still has not gotten a response.  Please call Melissa at [phone]

## 2018-08-18 NOTE — Telephone Encounter (Signed)
ok 

## 2018-08-19 ENCOUNTER — Other Ambulatory Visit: Payer: Self-pay | Admitting: Family Medicine

## 2018-08-19 DIAGNOSIS — F0280 Dementia in other diseases classified elsewhere without behavioral disturbance: Secondary | ICD-10-CM | POA: Diagnosis not present

## 2018-08-19 DIAGNOSIS — E440 Moderate protein-calorie malnutrition: Secondary | ICD-10-CM | POA: Diagnosis not present

## 2018-08-19 DIAGNOSIS — M6282 Rhabdomyolysis: Secondary | ICD-10-CM | POA: Diagnosis not present

## 2018-08-19 DIAGNOSIS — G308 Other Alzheimer's disease: Secondary | ICD-10-CM | POA: Diagnosis not present

## 2018-08-19 DIAGNOSIS — M810 Age-related osteoporosis without current pathological fracture: Secondary | ICD-10-CM | POA: Diagnosis not present

## 2018-08-19 DIAGNOSIS — I5030 Unspecified diastolic (congestive) heart failure: Secondary | ICD-10-CM | POA: Diagnosis not present

## 2018-08-19 DIAGNOSIS — Z8542 Personal history of malignant neoplasm of other parts of uterus: Secondary | ICD-10-CM | POA: Diagnosis not present

## 2018-08-19 DIAGNOSIS — D519 Vitamin B12 deficiency anemia, unspecified: Secondary | ICD-10-CM | POA: Diagnosis not present

## 2018-08-19 DIAGNOSIS — I110 Hypertensive heart disease with heart failure: Secondary | ICD-10-CM | POA: Diagnosis not present

## 2018-08-19 NOTE — Telephone Encounter (Signed)
Spoke with Melissa with [facility] verbalized understanding verbal orders requested have been approved by Dr Volanda Napoleon

## 2018-08-24 ENCOUNTER — Other Ambulatory Visit: Payer: Self-pay

## 2018-08-24 ENCOUNTER — Encounter (HOSPITAL_COMMUNITY): Payer: Self-pay

## 2018-08-24 ENCOUNTER — Ambulatory Visit (HOSPITAL_COMMUNITY)
Admission: EM | Admit: 2018-08-24 | Discharge: 2018-08-24 | Disposition: A | Discharge status: Home / Self Care | Payer: Medicare (Managed Care) | Attending: Urgent Care | Admitting: Urgent Care

## 2018-08-24 ENCOUNTER — Telehealth: Payer: Self-pay | Admitting: Family Medicine

## 2018-08-24 DIAGNOSIS — E860 Dehydration: Secondary | ICD-10-CM | POA: Insufficient documentation

## 2018-08-24 DIAGNOSIS — D519 Vitamin B12 deficiency anemia, unspecified: Secondary | ICD-10-CM | POA: Diagnosis not present

## 2018-08-24 DIAGNOSIS — R410 Disorientation, unspecified: Secondary | ICD-10-CM | POA: Diagnosis present

## 2018-08-24 DIAGNOSIS — M810 Age-related osteoporosis without current pathological fracture: Secondary | ICD-10-CM | POA: Diagnosis not present

## 2018-08-24 DIAGNOSIS — I110 Hypertensive heart disease with heart failure: Secondary | ICD-10-CM | POA: Diagnosis not present

## 2018-08-24 DIAGNOSIS — M6282 Rhabdomyolysis: Secondary | ICD-10-CM | POA: Diagnosis not present

## 2018-08-24 DIAGNOSIS — F0390 Unspecified dementia without behavioral disturbance: Secondary | ICD-10-CM | POA: Insufficient documentation

## 2018-08-24 DIAGNOSIS — R531 Weakness: Secondary | ICD-10-CM | POA: Diagnosis not present

## 2018-08-24 DIAGNOSIS — E440 Moderate protein-calorie malnutrition: Secondary | ICD-10-CM | POA: Diagnosis not present

## 2018-08-24 DIAGNOSIS — Z8542 Personal history of malignant neoplasm of other parts of uterus: Secondary | ICD-10-CM | POA: Diagnosis not present

## 2018-08-24 DIAGNOSIS — I5030 Unspecified diastolic (congestive) heart failure: Secondary | ICD-10-CM | POA: Diagnosis not present

## 2018-08-24 DIAGNOSIS — G309 Alzheimer's disease, unspecified: Secondary | ICD-10-CM | POA: Diagnosis not present

## 2018-08-24 DIAGNOSIS — F0280 Dementia in other diseases classified elsewhere without behavioral disturbance: Secondary | ICD-10-CM | POA: Diagnosis not present

## 2018-08-24 LAB — POCT URINALYSIS DIP (DEVICE)
Glucose, UA: 100 mg/dL — AB
Nitrite: NEGATIVE
Protein, ur: 100 mg/dL — AB
Specific Gravity, Urine: >=1.03 (ref 1.005–1.030)
Urobilinogen, UA: 2 mg/dL — ABNORMAL HIGH (ref 0.0–1.0)
pH: 5.5 (ref 5.0–8.0)

## 2018-08-24 NOTE — Telephone Encounter (Signed)
Pt should be evaluated at the nearest UC or ED as she could be weak from numerous conditions including but not limited to viral illness, UTI, dehydrations, sepsis, etc.

## 2018-08-24 NOTE — ED Triage Notes (Addendum)
Patient presents to Urgent Care with complaints of a period of weakness since this morning while her caregiver was getting her up out of a chair. Patient reports EMS was called, did a stroke scale, and patient refused to be taken to the hospital. Caregiver reports the pt was "out of it" for about an hour this morning from 8000-0900. Pt caregiver, pt is back to her baseline.  Pt has dementia baseline, oriented to person, place, and situation.

## 2018-08-24 NOTE — ED Provider Notes (Signed)
MRN: [id number] DOB: 1935/08/06  Subjective:   Nicole Blackwell is a 83 y.o. female presenting for acute onset this morning of urinary frequency, urinary urgency.  However, the daughter does report that this is more of a chronic issue and has worsened in the past day.  She has not been incontinent.  Patient did have an episode of confusion this morning, EMS was called and patient refused to be transported to the hospital.  She does have a baseline level of dementia, presents with her daughter who states that she is more her normal self and patient endorses this as well.  Denies ongoing confusion, headache, throat pain, cough, chest pain, shortness of breath, nausea, vomiting, belly pain, incontinence, hematuria, dysuria.   No current facility-administered medications for this encounter.   Current Outpatient Medications:  .  donepezil (ARICEPT) 5 MG tablet, Take 1 tablet (5 mg total) by mouth at bedtime., Disp: 30 tablet, Rfl: 3 .  acetaminophen (TYLENOL) 325 MG tablet, Take 2 tablets (650 mg total) by mouth every 6 (six) hours as needed for mild pain or headache (or Fever >/= 101)., Disp: , Rfl:  .  aspirin EC 81 MG tablet, Take 81 mg by mouth at bedtime., Disp: , Rfl:  .  feeding supplement, ENSURE ENLIVE, (ENSURE ENLIVE) LIQD, Take 237 mLs by mouth 2 (two) times daily between meals., Disp: 237 mL, Rfl: 12 .  irbesartan (AVAPRO) 75 MG tablet, Take 1 tablet (75 mg total) by mouth daily., Disp: 30 tablet, Rfl: 0 .  losartan (COZAAR) 25 MG tablet, TAKE 1 TABLET(25 MG) BY MOUTH DAILY, Disp: 30 tablet, Rfl: 0 .  Melatonin 3 MG TABS, Take 1 tablet (3 mg total) by mouth at bedtime., Disp: 30 tablet, Rfl: 0    Allergies  Allergen Reactions  . Penicillin G Benzathine Swelling    Did it involve swelling of the face/tongue/throat, SOB, or low BP? Unknown Did it involve sudden or severe rash/hives, skin peeling, or any reaction on the inside of your mouth or nose? Unknown Did you need to seek medical  attention at a hospital or doctor's office? Unknown When did it last happen?unknown If all above answers are "NO", may proceed with cephalosporin use.  Marland Kitchen Penicillins Swelling    Did it involve swelling of the face/tongue/throat, SOB, or low BP? Unknown Did it involve sudden or severe rash/hives, skin peeling, or any reaction on the inside of your mouth or nose? Unknown Did you need to seek medical attention at a hospital or doctor's office? Unknown When did it last happen?unknown If all above answers are "NO", may proceed with cephalosporin use.    Past Medical History:  Diagnosis Date  . BACK PAIN   . OSTEOPOROSIS   . PLANTAR FASCIITIS   . Positive PPD   . SHINGLES   . SYMPTOMATIC MENOPAUSAL/FEMALE CLIMACTERIC STATES   . Syncope and collapse 12/13/2014  . Uterine cancer [facility])      Past Surgical History:  Procedure Laterality Date  . CATARACT EXTRACTION, BILATERAL Bilateral   . DILATION AND CURETTAGE OF UTERUS  1960's X 2   "I lost 2 children to miscarriage"  . LAPAROSCOPIC CHOLECYSTECTOMY    . SIGMOIDOSCOPY    . TONSILLECTOMY    . VAGINAL HYSTERECTOMY      ROS  Objective:   Vitals: BP 139/65 (BP Location: Left Arm)   Pulse 72   Temp 98.9 F (37.2 C) (Oral)   Resp 18   SpO2 100%   Physical Exam Constitutional:  General: She is not in acute distress.    Appearance: Normal appearance. She is well-developed. She is not ill-appearing.  HENT:     Head: Normocephalic and atraumatic.     Nose: Nose normal.     Mouth/Throat:     Mouth: Mucous membranes are moist.     Pharynx: Oropharynx is clear.  Eyes:     General: No scleral icterus.    Extraocular Movements: Extraocular movements intact.     Pupils: Pupils are equal, round, and reactive to light.  Cardiovascular:     Rate and Rhythm: Normal rate.  Pulmonary:     Effort: Pulmonary effort is normal.  Skin:    General: Skin is warm and dry.  Neurological:     General: No focal deficit  present.     Mental Status: She is alert. Mental status is at baseline.     Cranial Nerves: No cranial nerve deficit.     Motor: Weakness present.     Gait: Gait abnormal (shuffle, not new).     Comments: Patient is very lucid in her communication, does not have slurred speech.  She is alert and oriented to her person, place and time.  Psychiatric:        Mood and Affect: Mood normal.        Behavior: Behavior normal.    Results for orders placed or performed during the hospital encounter of 08/24/18 (from the past 24 hour(s))  POCT urinalysis dip (device)     Status: Abnormal   Collection Time: 08/24/18  7:36 PM  Result Value Ref Range   Glucose, UA 100 (A) NEGATIVE mg/dL   Bilirubin Urine SMALL (A) NEGATIVE   Ketones, ur TRACE (A) NEGATIVE mg/dL   Specific Gravity, Urine >=1.030 1.005 - 1.030   Hgb urine dipstick TRACE (A) NEGATIVE   pH 5.5 5.0 - 8.0   Protein, ur 100 (A) NEGATIVE mg/dL   Urobilinogen, UA 2.0 (H) 0.0 - 1.0 mg/dL   Nitrite NEGATIVE NEGATIVE   Leukocytes,Ua TRACE (A) NEGATIVE    Assessment and Plan :   1. Mild dehydration   2. Weakness   3. Acute confusion   4. Dementia without behavioral disturbance, unspecified dementia type ([place])    Counseled patient on hydrating adequately every day with at least 32 ounces of water.  Patient and her daughter refused antibiotic course for empiric treatment of cystitis.  Counseled that we will wait for the urine culture and provide with prescriptions as appropriate. Counseled patient on potential for adverse effects with medications prescribed/recommended today, ER and return-to-clinic precautions discussed, patient verbalized understanding.    Jaynee Eagles, [place] 08/24/18 1952

## 2018-08-24 NOTE — Telephone Encounter (Signed)
Please advise 

## 2018-08-24 NOTE — Telephone Encounter (Signed)
Clare Gandy PT Asst w/[facility] [phone] wanted the provider to know that the EMS was called for the patient because she was very weak.  She refused to be taken to the hospital.  Her vitals were in normal limits. She is responsive.  Please advise.

## 2018-08-24 NOTE — Telephone Encounter (Signed)
Spoke with pt daughter advised that Dr Volanda Napoleon wants pt go to nearest UC or ED for further evaluation, pt daughter verbalized understanding states that pt was ordered to stay in bed  By the PT but she keep getting out of the bed. Advised to call the office with any update.

## 2018-08-25 LAB — URINE CULTURE

## 2018-08-27 ENCOUNTER — Emergency Department (HOSPITAL_COMMUNITY): Payer: Medicare (Managed Care)

## 2018-08-27 ENCOUNTER — Other Ambulatory Visit: Payer: Self-pay

## 2018-08-27 ENCOUNTER — Ambulatory Visit (HOSPITAL_COMMUNITY)
Admission: EM | Admit: 2018-08-27 | Discharge: 2018-08-27 | Disposition: A | Discharge status: Home / Self Care | Payer: Medicare HMO

## 2018-08-27 ENCOUNTER — Observation Stay (HOSPITAL_COMMUNITY)
Admission: EM | Admit: 2018-08-27 | Discharge: 2018-08-28 | Disposition: A | Discharge status: Home / Self Care | Payer: Medicare (Managed Care) | Attending: Internal Medicine | Admitting: Internal Medicine

## 2018-08-27 ENCOUNTER — Encounter (HOSPITAL_COMMUNITY): Payer: Self-pay

## 2018-08-27 DIAGNOSIS — M810 Age-related osteoporosis without current pathological fracture: Secondary | ICD-10-CM | POA: Insufficient documentation

## 2018-08-27 DIAGNOSIS — Z79899 Other long term (current) drug therapy: Secondary | ICD-10-CM | POA: Diagnosis not present

## 2018-08-27 DIAGNOSIS — R5383 Other fatigue: Secondary | ICD-10-CM | POA: Diagnosis present

## 2018-08-27 DIAGNOSIS — I088 Other rheumatic multiple valve diseases: Secondary | ICD-10-CM | POA: Insufficient documentation

## 2018-08-27 DIAGNOSIS — I110 Hypertensive heart disease with heart failure: Secondary | ICD-10-CM | POA: Diagnosis not present

## 2018-08-27 DIAGNOSIS — Z7982 Long term (current) use of aspirin: Secondary | ICD-10-CM | POA: Diagnosis not present

## 2018-08-27 DIAGNOSIS — M6282 Rhabdomyolysis: Secondary | ICD-10-CM | POA: Insufficient documentation

## 2018-08-27 DIAGNOSIS — E46 Unspecified protein-calorie malnutrition: Secondary | ICD-10-CM | POA: Diagnosis not present

## 2018-08-27 DIAGNOSIS — Z8542 Personal history of malignant neoplasm of other parts of uterus: Secondary | ICD-10-CM | POA: Insufficient documentation

## 2018-08-27 DIAGNOSIS — R55 Syncope and collapse: Principal | ICD-10-CM

## 2018-08-27 DIAGNOSIS — I951 Orthostatic hypotension: Secondary | ICD-10-CM | POA: Insufficient documentation

## 2018-08-27 DIAGNOSIS — I5030 Unspecified diastolic (congestive) heart failure: Secondary | ICD-10-CM | POA: Diagnosis not present

## 2018-08-27 DIAGNOSIS — I5032 Chronic diastolic (congestive) heart failure: Secondary | ICD-10-CM | POA: Diagnosis present

## 2018-08-27 DIAGNOSIS — R030 Elevated blood-pressure reading, without diagnosis of hypertension: Secondary | ICD-10-CM | POA: Diagnosis present

## 2018-08-27 DIAGNOSIS — Z1159 Encounter for screening for other viral diseases: Secondary | ICD-10-CM | POA: Diagnosis not present

## 2018-08-27 DIAGNOSIS — F0150 Vascular dementia without behavioral disturbance: Secondary | ICD-10-CM | POA: Diagnosis not present

## 2018-08-27 DIAGNOSIS — Y9222 Religious institution as the place of occurrence of the external cause: Secondary | ICD-10-CM | POA: Insufficient documentation

## 2018-08-27 DIAGNOSIS — E538 Deficiency of other specified B group vitamins: Secondary | ICD-10-CM | POA: Insufficient documentation

## 2018-08-27 HISTORY — DX: Essential (primary) hypertension: I10

## 2018-08-27 LAB — URINALYSIS, ROUTINE W REFLEX MICROSCOPIC
Bilirubin Urine: NEGATIVE
Glucose, UA: NEGATIVE mg/dL
Hgb urine dipstick: NEGATIVE
Ketones, ur: NEGATIVE mg/dL
Nitrite: NEGATIVE
Protein, ur: NEGATIVE mg/dL
Specific Gravity, Urine: 1.011 (ref 1.005–1.030)
pH: 7 (ref 5.0–8.0)

## 2018-08-27 LAB — CBC WITH DIFFERENTIAL/PLATELET
Abs Immature Granulocytes: 0.02 10*3/uL (ref 0.00–0.07)
Basophils Absolute: 0 10*3/uL (ref 0.0–0.1)
Basophils Relative: 1 %
Eosinophils Absolute: 0 10*3/uL (ref 0.0–0.5)
Eosinophils Relative: 0 %
HCT: 48.5 % — ABNORMAL HIGH (ref 36.0–46.0)
Hemoglobin: 15 g/dL (ref 12.0–15.0)
Immature Granulocytes: 0 %
Lymphocytes Relative: 22 %
Lymphs Abs: 1.4 10*3/uL (ref 0.7–4.0)
MCH: 25.3 pg — ABNORMAL LOW (ref 26.0–34.0)
MCHC: 30.9 g/dL (ref 30.0–36.0)
MCV: 81.6 fL (ref 80.0–100.0)
Monocytes Absolute: 0.4 10*3/uL (ref 0.1–1.0)
Monocytes Relative: 6 %
Neutro Abs: 4.4 10*3/uL (ref 1.7–7.7)
Neutrophils Relative %: 71 %
Platelets: 239 10*3/uL (ref 150–400)
RBC: 5.94 MIL/uL — ABNORMAL HIGH (ref 3.87–5.11)
RDW: 16.5 % — ABNORMAL HIGH (ref 11.5–15.5)
WBC: 6.2 10*3/uL (ref 4.0–10.5)
nRBC: 0 % (ref 0.0–0.2)

## 2018-08-27 LAB — COMPREHENSIVE METABOLIC PANEL
ALT: 16 U/L (ref 0–44)
AST: 24 U/L (ref 15–41)
Albumin: 3.5 g/dL (ref 3.5–5.0)
Alkaline Phosphatase: 69 U/L (ref 38–126)
Anion gap: 9 (ref 5–15)
BUN: 11 mg/dL (ref 8–23)
CO2: 25 mmol/L (ref 22–32)
Calcium: 9.1 mg/dL (ref 8.9–10.3)
Chloride: 104 mmol/L (ref 98–111)
Creatinine, Ser: 0.9 mg/dL (ref 0.44–1.00)
GFR calc Af Amer: >60 mL/min (ref >60)
GFR calc non Af Amer: 60 mL/min — ABNORMAL LOW (ref >60)
Glucose, Bld: 106 mg/dL — ABNORMAL HIGH (ref 70–99)
Potassium: 3.8 mmol/L (ref 3.5–5.1)
Sodium: 138 mmol/L (ref 135–145)
Total Bilirubin: 1.1 mg/dL (ref 0.3–1.2)
Total Protein: 7 g/dL (ref 6.5–8.1)

## 2018-08-27 LAB — TROPONIN I (HIGH SENSITIVITY)
Troponin I (High Sensitivity): 5 ng/L (ref <18)
Troponin I (High Sensitivity): 6 ng/L (ref <18)

## 2018-08-27 LAB — TSH: TSH: 1.452 u[IU]/mL (ref 0.350–4.500)

## 2018-08-27 LAB — CK: Total CK: 405 U/L — ABNORMAL HIGH (ref 38–234)

## 2018-08-27 IMAGING — DX PORTABLE CHEST - 1 VIEW
1 series · 1 of 1 positions shown · non-contrast
Comparison: Chest radiograph [DATE]

CLINICAL DATA: Syncopal episode.  Dementia.

EXAM:
PORTABLE CHEST 1 VIEW

[chest]
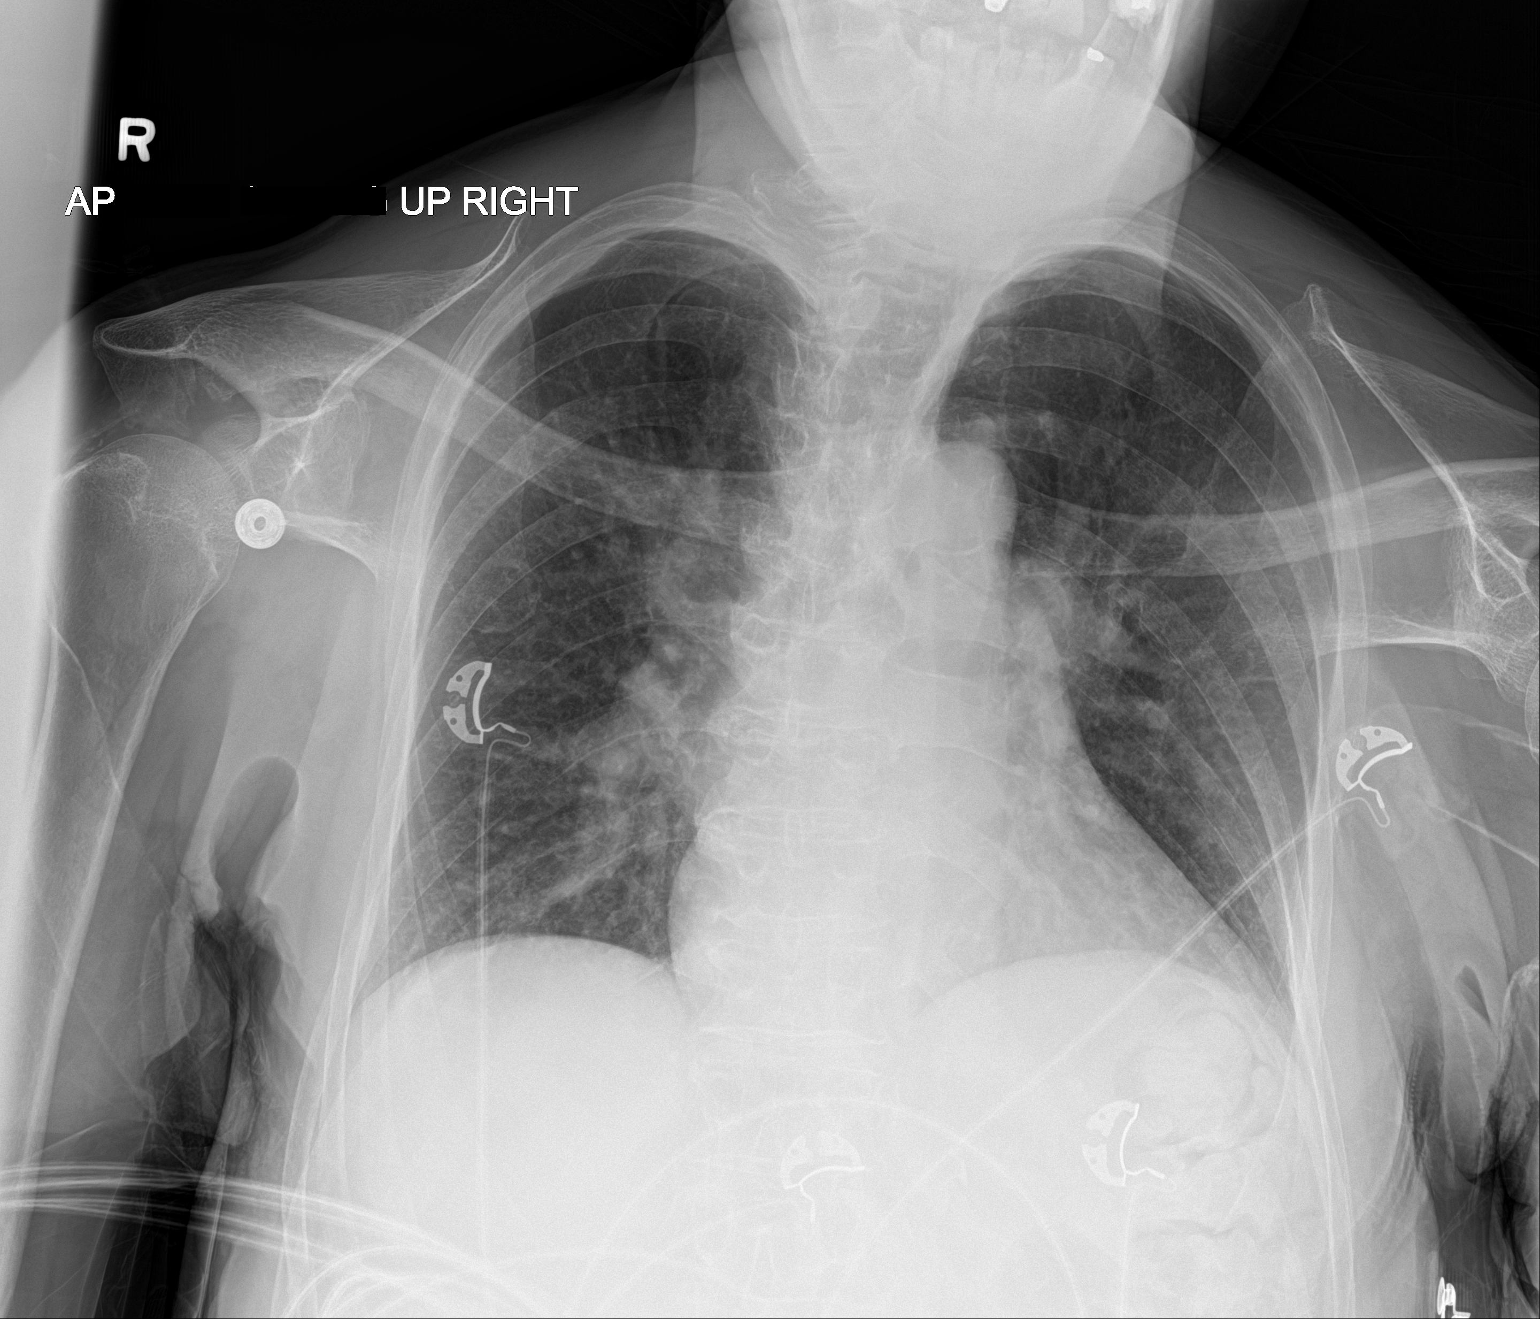

[1 of 1 positions shown; findings below may reference images not displayed]

FINDINGS: Monitoring leads overlie the patient. Stable enlarged cardiac and
mediastinal contours. No consolidative pulmonary opacities. No
pleural effusion or pneumothorax. Similar-appearing hilar prominence
bilaterally most compatible with dilated main pulmonary artery.
IMPRESSION: No acute cardiopulmonary process.

## 2018-08-27 IMAGING — CT CT HEAD WITHOUT CONTRAST
4 series · 15 of 47 positions shown, 17 images · non-contrast
Comparison: [DATE]

CLINICAL DATA: Syncope.

EXAM:
CT HEAD WITHOUT CONTRAST
TECHNIQUE: Contiguous axial images were obtained from the base of the skull
through the vertex without intravenous contrast.

[Series 3: head bone · axial · 0.44mm/px · z∈[-76,-62]mm · 2 of 71 slices shown]
[im 8/71  bone]
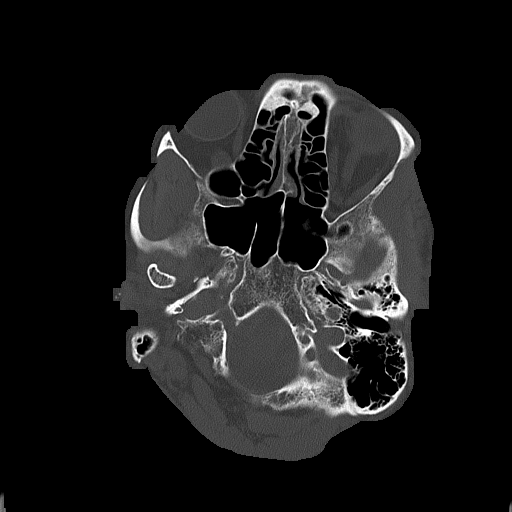
[im 15/71  bone]
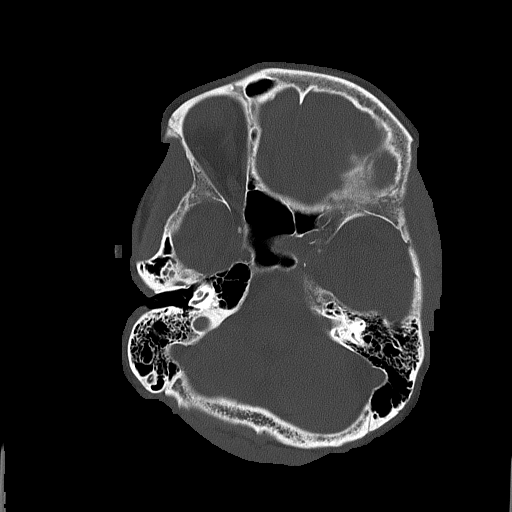

[Series 4: head without · axial · non-contrast · 0.44mm/px · z∈[-75,+30]mm · 7 of 29 slices shown, 9 images]
[im 4/29  brain]
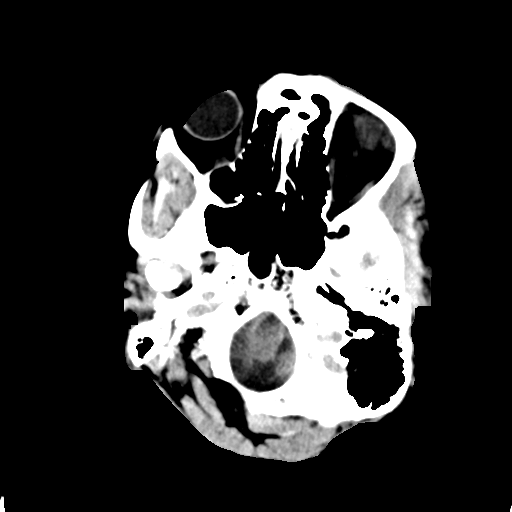
[im 4/29  bone]
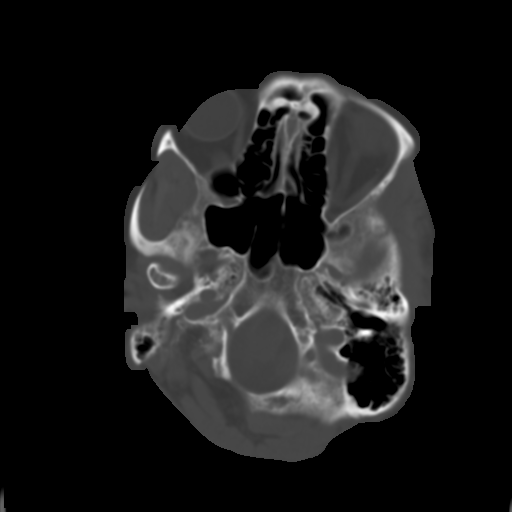
[im 8/29  brain]
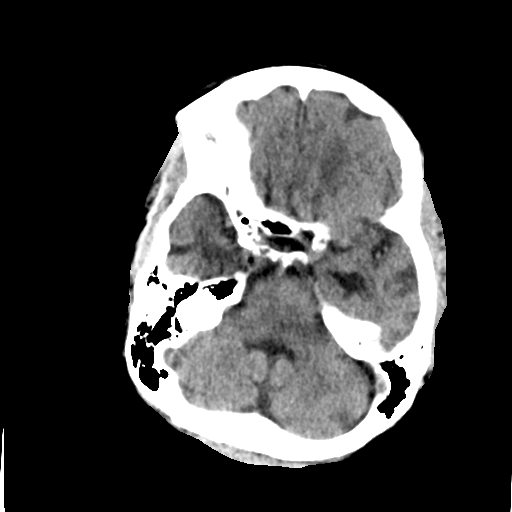
[im 11/29  brain]
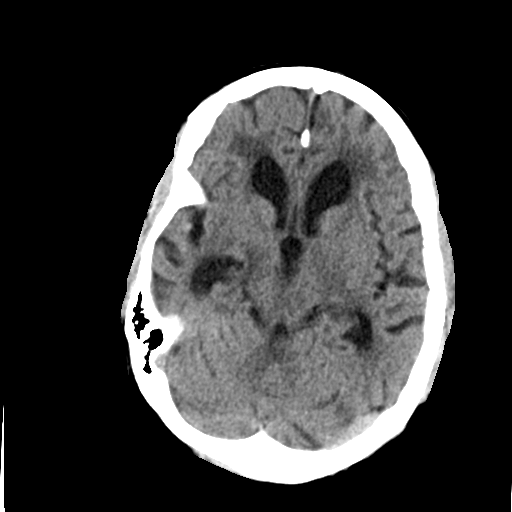
[im 15/29  brain]
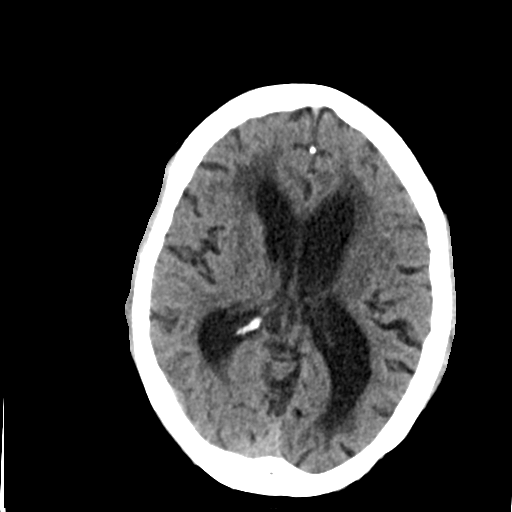
[im 18/29  brain]
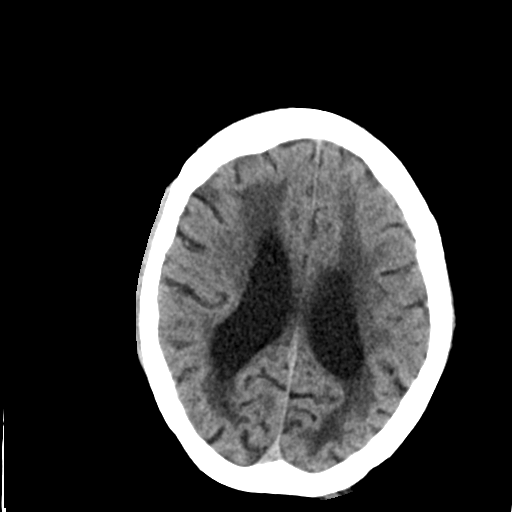
[im 18/29  bone]
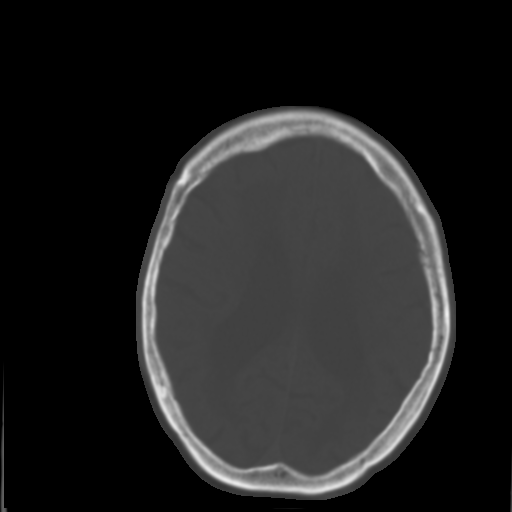
[im 22/29  brain]
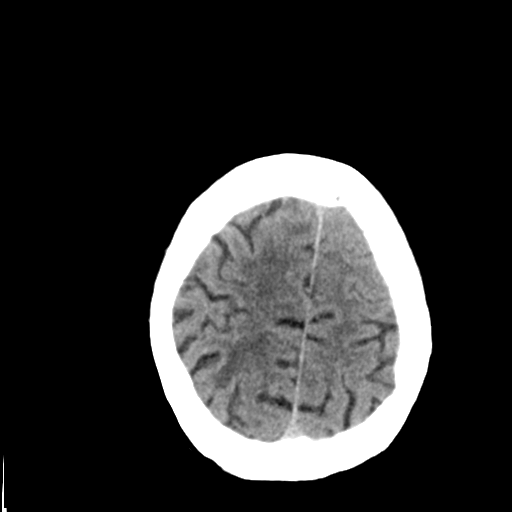
[im 25/29  brain]
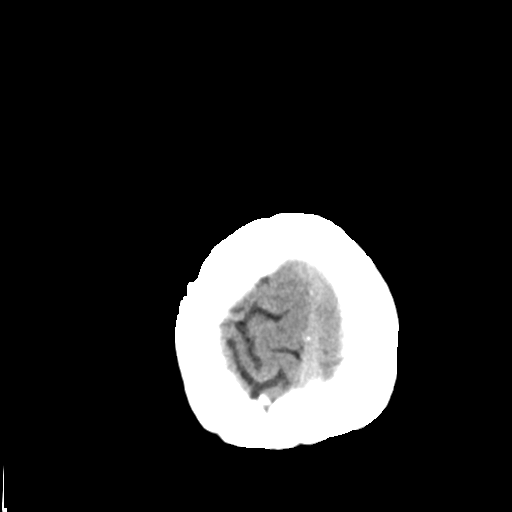

[Series 5: head without cor · coronal · non-contrast · 0.28mm/px · 3 of 65 slices shown]
[im 22/65  brain]
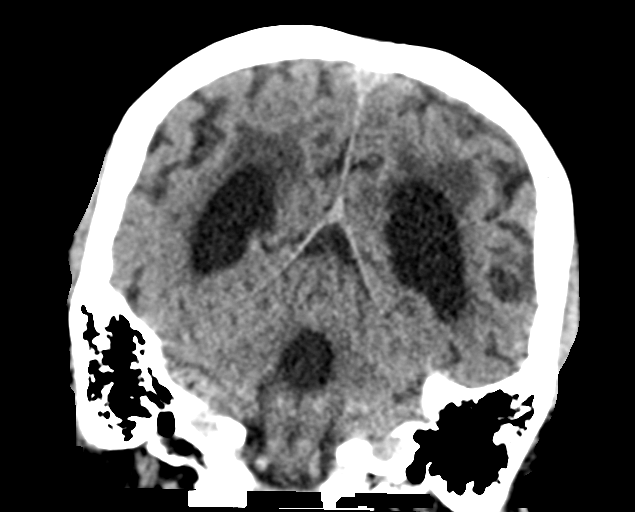
[im 29/65  brain]
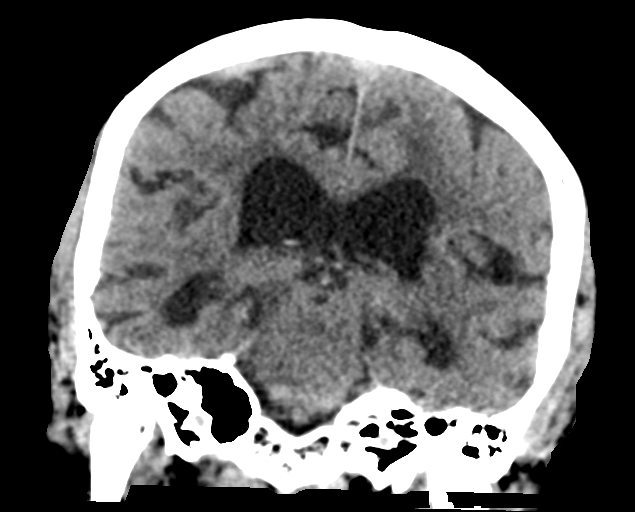
[im 36/65  brain]
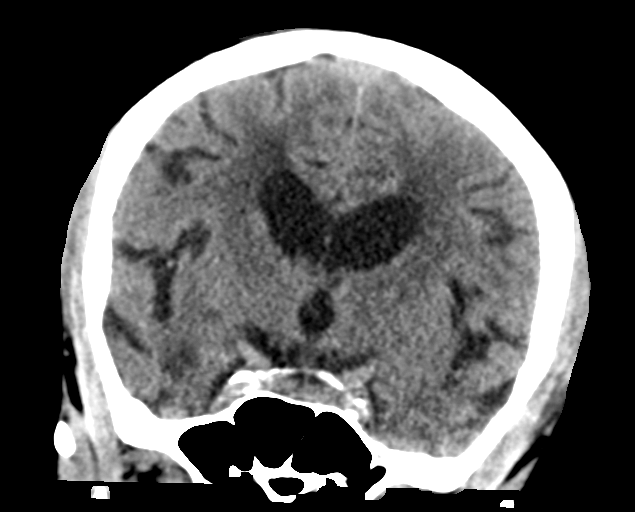

[Series 6: head without sag · sagittal · non-contrast · 0.28mm/px · 3 of 67 slices shown]
[im 23/67  brain]
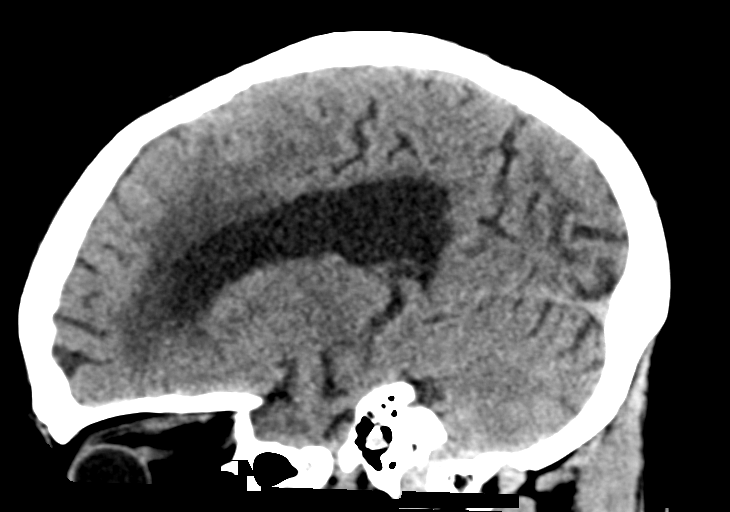
[im 34/67  brain]
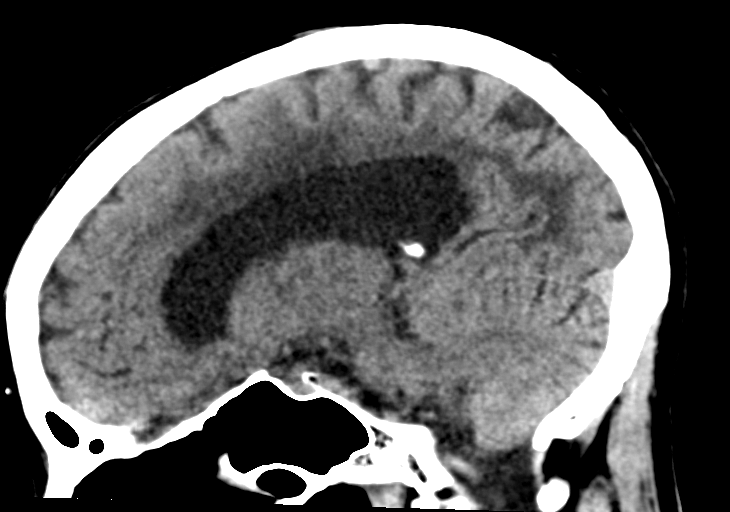
[im 45/67  brain]
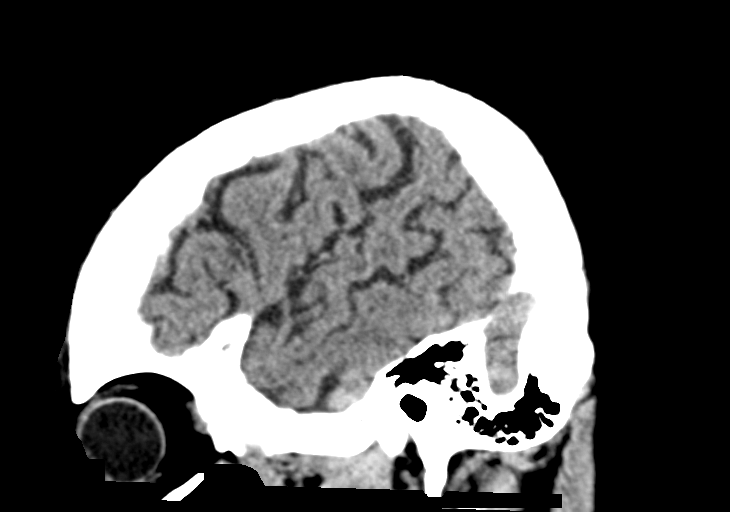

[15 of 47 positions shown; findings below may reference images not displayed]

FINDINGS: Brain: No subdural, epidural, or subarachnoid hemorrhage.
Low-attenuation in the medulla is seen on axial image 5, not present
[CU], but not acute in appearance. This could represent an infarct
or artifact. Brainstem is otherwise normal. Cerebellum is normal.
Basal cisterns are normal. Ventricles and sulci are prominent but
stable. White matter changes are moderate to severe and stable. No
acute cortical ischemia or infarct.

Vascular: No hyperdense vessel or unexpected calcification.

Skull: Normal. Negative for fracture or focal lesion.

Sinuses/Orbits: No acute finding.

Other: None.
IMPRESSION: 1. Chronic white matter changes. No acute intracranial abnormalities
identified.

## 2018-08-27 MED ORDER — ONDANSETRON HCL 4 MG/2ML IJ SOLN: 4.0000 mg | Freq: Four times a day (QID) | INTRAMUSCULAR | Status: DC | PRN

## 2018-08-27 MED ORDER — ONDANSETRON HCL 4 MG PO TABS: 4.0000 mg | ORAL_TABLET | Freq: Four times a day (QID) | ORAL | Status: DC | PRN

## 2018-08-27 MED ORDER — VITAMIN B-12 100 MCG PO TABS
100.0000 ug | ORAL_TABLET | Freq: Every day | ORAL | Status: DC
  Administered 2018-08-28: 100 ug via ORAL
  Filled 2018-08-27: qty 1

## 2018-08-27 MED ORDER — LOSARTAN POTASSIUM 25 MG PO TABS
25.0000 mg | ORAL_TABLET | Freq: Every day | ORAL | Status: DC
  Administered 2018-08-28: 25 mg via ORAL
  Filled 2018-08-27: qty 1

## 2018-08-27 MED ORDER — ACETAMINOPHEN 325 MG PO TABS: 650.0000 mg | ORAL_TABLET | Freq: Four times a day (QID) | ORAL | Status: DC | PRN

## 2018-08-27 MED ORDER — SODIUM CHLORIDE 0.9 % IV BOLUS
1000.0000 mL | Freq: Once | INTRAVENOUS | Status: AC
  Administered 2018-08-27: 1000 mL via INTRAVENOUS

## 2018-08-27 MED ORDER — ACETAMINOPHEN 650 MG RE SUPP: 650.0000 mg | Freq: Four times a day (QID) | RECTAL | Status: DC | PRN

## 2018-08-27 MED ORDER — SODIUM CHLORIDE 0.9% FLUSH
3.0000 mL | Freq: Two times a day (BID) | INTRAVENOUS | Status: DC
  Administered 2018-08-27 – 2018-08-28 (×2): 3 mL via INTRAVENOUS

## 2018-08-27 MED ORDER — POTASSIUM CHLORIDE IN NACL 20-0.9 MEQ/L-% IV SOLN
INTRAVENOUS | Status: DC
  Administered 2018-08-27: 21:00:00 via INTRAVENOUS
  Filled 2018-08-27 (×2): qty 1000

## 2018-08-27 MED ORDER — ENOXAPARIN SODIUM 40 MG/0.4ML ~~LOC~~ SOLN
40.0000 mg | SUBCUTANEOUS | Status: DC
  Filled 2018-08-27: qty 0.4

## 2018-08-27 MED ORDER — ALUM & MAG HYDROXIDE-SIMETH 200-200-20 MG/5ML PO SUSP: 30.0000 mL | Freq: Four times a day (QID) | ORAL | Status: DC | PRN

## 2018-08-27 MED ORDER — ASPIRIN EC 81 MG PO TBEC: 81.0000 mg | DELAYED_RELEASE_TABLET | Freq: Every day | ORAL | Status: DC

## 2018-08-27 MED ORDER — IBUPROFEN 200 MG PO TABS: 400.0000 mg | ORAL_TABLET | Freq: Four times a day (QID) | ORAL | Status: DC | PRN

## 2018-08-27 MED ORDER — MELATONIN 3 MG PO TABS
3.0000 mg | ORAL_TABLET | Freq: Every day | ORAL | Status: DC
  Administered 2018-08-27: 3 mg via ORAL
  Filled 2018-08-27 (×3): qty 1

## 2018-08-27 MED ORDER — POLYETHYLENE GLYCOL 3350 17 G PO PACK: 17.0000 g | PACK | Freq: Every day | ORAL | Status: DC | PRN

## 2018-08-27 NOTE — ED Notes (Signed)
Patient transported to CT 

## 2018-08-27 NOTE — ED Notes (Addendum)
ED TO INPATIENT HANDOFF REPORT  ED Nurse Name and Phone #: Davene Costain 1  S Name/Age/Gender Nicole Blackwell 83 y.o. female Room/Bed: 046C/046C  Code Status   Code Status: Prior  Home/SNF/Other Home Patient oriented to: self, place,and situation Is this baseline? Yes   Triage Complete: Triage complete  Chief Complaint Fatigue  Triage Note Pt from outdoor church function where pt had a syncopal episode that lasted approx 10 minutes, eyes up, unresponsive; pt's daughter states she was sitting and laid head on table; per pt's daughter, pt c/o leg weakness earlier in the week, was evaluated at urgent care; denies pain; hx dementia, CHF; denies CP, sob, blurry vision, denies weakness   Allergies Allergies  Allergen Reactions  . Penicillin G Benzathine Swelling    Did it involve swelling of the face/tongue/throat, SOB, or low BP? Unknown Did it involve sudden or severe rash/hives, skin peeling, or any reaction on the inside of your mouth or nose? Unknown Did you need to seek medical attention at a hospital or doctor's office? Unknown When did it last happen?unknown If all above answers are "NO", may proceed with cephalosporin use.  Marland Kitchen Penicillins Swelling    Did it involve swelling of the face/tongue/throat, SOB, or low BP? Unknown Did it involve sudden or severe rash/hives, skin peeling, or any reaction on the inside of your mouth or nose? Unknown Did you need to seek medical attention at a hospital or doctor's office? Unknown When did it last happen?unknown If all above answers are "NO", may proceed with cephalosporin use.    Level of Care/Admitting Diagnosis ED Disposition    ED Disposition Condition Comment   Admit  Hospital Area: [facility] [[id number]]  Level of Care: Telemetry Cardiac [103]  I expect the patient will be discharged within 24 hours: Yes  LOW acuity---Tx typically complete <24 hrs---ACUTE conditions typically can be evaluated <24  hours---LABS likely to return to acceptable levels <24 hours---IS near functional baseline---EXPECTED to return to current living arrangement---NOT newly hypoxic: Meets criteria for 5C-Observation unit  Covid Evaluation: Asymptomatic Screening Protocol (No Symptoms)  Diagnosis: Syncope [206001]  Admitting Physician: Lady Deutscher [[id number]]  Attending Physician: Lady Deutscher [[id number]]  PT Class (Do Not Modify): Observation [104]  PT Acc Code (Do Not Modify): Observation [10022]       B Medical/Surgery History Past Medical History:  Diagnosis Date  . BACK PAIN   . Hypertension   . OSTEOPOROSIS   . PLANTAR FASCIITIS   . Positive PPD   . SHINGLES   . SYMPTOMATIC MENOPAUSAL/FEMALE CLIMACTERIC STATES   . Syncope and collapse 12/13/2014  . Uterine cancer [facility])    Past Surgical History:  Procedure Laterality Date  . CATARACT EXTRACTION, BILATERAL Bilateral   . DILATION AND CURETTAGE OF UTERUS  1960's X 2   "I lost 2 children to miscarriage"  . LAPAROSCOPIC CHOLECYSTECTOMY    . SIGMOIDOSCOPY    . TONSILLECTOMY    . VAGINAL HYSTERECTOMY       A IV Location/Drains/Wounds Patient Lines/Drains/Airways Status   Active Line/Drains/Airways    Name:   Placement date:   Placement time:   Site:   Days:   Peripheral IV 08/27/18 Left Antecubital   08/27/18    1339    Antecubital   less than 1          Intake/Output Last 24 hours  Intake/Output Summary (Last 24 hours) at 08/27/2018 1704 Last data filed at 08/27/2018 1548 Gross per 24 hour  Intake 1000 ml  Output -  Net 1000 ml    Labs/Imaging Results for orders placed or performed during the hospital encounter of 08/27/18 (from the past 48 hour(s))  Comprehensive metabolic panel     Status: Abnormal   Collection Time: 08/27/18  1:40 PM  Result Value Ref Range   Sodium 138 135 - 145 mmol/L   Potassium 3.8 3.5 - 5.1 mmol/L   Chloride 104 98 - 111 mmol/L   CO2 25 22 - 32 mmol/L   Glucose, Bld 106 (H) 70 - 99 mg/dL   BUN  11 8 - 23 mg/dL   Creatinine, Ser 0.90 0.44 - 1.00 mg/dL   Calcium 9.1 8.9 - 10.3 mg/dL   Total Protein 7.0 6.5 - 8.1 g/dL   Albumin 3.5 3.5 - 5.0 g/dL   AST 24 15 - 41 U/L   ALT 16 0 - 44 U/L   Alkaline Phosphatase 69 38 - 126 U/L   Total Bilirubin 1.1 0.3 - 1.2 mg/dL   GFR calc non Af Amer 60 (L) >60 mL/min   GFR calc Af Amer >60 >60 mL/min   Anion gap 9 5 - 15    Comment: Performed at [facility], [address] [place], [place] [address]  CBC with Differential     Status: Abnormal   Collection Time: 08/27/18  1:40 PM  Result Value Ref Range   WBC 6.2 4.0 - 10.5 K/uL   RBC 5.94 (H) 3.87 - 5.11 MIL/uL   Hemoglobin 15.0 12.0 - 15.0 g/dL   HCT 48.5 (H) 36.0 - 46.0 %   MCV 81.6 80.0 - 100.0 fL   MCH 25.3 (L) 26.0 - 34.0 pg   MCHC 30.9 30.0 - 36.0 g/dL   RDW 16.5 (H) 11.5 - 15.5 %   Platelets 239 150 - 400 K/uL   nRBC 0.0 0.0 - 0.2 %   Neutrophils Relative % 71 %   Neutro Abs 4.4 1.7 - 7.7 K/uL   Lymphocytes Relative 22 %   Lymphs Abs 1.4 0.7 - 4.0 K/uL   Monocytes Relative 6 %   Monocytes Absolute 0.4 0.1 - 1.0 K/uL   Eosinophils Relative 0 %   Eosinophils Absolute 0.0 0.0 - 0.5 K/uL   Basophils Relative 1 %   Basophils Absolute 0.0 0.0 - 0.1 K/uL   Immature Granulocytes 0 %   Abs Immature Granulocytes 0.02 0.00 - 0.07 K/uL    Comment: Performed at [facility] [address], [place], [place] [address]  Troponin I (High Sensitivity)     Status: None   Collection Time: 08/27/18  1:40 PM  Result Value Ref Range   Troponin I (High Sensitivity) 5 <18 ng/L    Comment: (NOTE) Elevated high sensitivity troponin I (hsTnI) values and significant  changes across serial measurements may suggest ACS but many other  chronic and acute conditions are known to elevate hsTnI results.  Refer to the "Links" section for chest pain algorithms and additional  guidance. Performed at [facility], [place] [address], [place], [place] [address]   CK     Status: Abnormal    Collection Time: 08/27/18  1:40 PM  Result Value Ref Range   Total CK 405 (H) 38 - 234 U/L    Comment: Performed at [facility], [place] [address], [place], [place] [address]  Troponin I (High Sensitivity)     Status: None   Collection Time: 08/27/18  3:33 PM  Result Value Ref Range   Troponin I (High Sensitivity) 6 <18 ng/L    Comment: (NOTE) Elevated high sensitivity troponin I (hsTnI) values and significant  changes across serial measurements may suggest ACS but many other  chronic and acute conditions are known to elevate hsTnI results.  Refer to the "Links" section for chest pain algorithms and additional  guidance. Performed at [facility], [place] [address] [place], [place] [address]    Ct Head Wo Contrast  Result Date: 08/27/2018 CLINICAL DATA:  Syncope. EXAM: CT HEAD WITHOUT CONTRAST TECHNIQUE: Contiguous axial images were obtained from the base of the skull through the vertex without intravenous contrast. COMPARISON:  September 25, 2016 FINDINGS: Brain: No subdural, epidural, or subarachnoid hemorrhage. Low-attenuation in the medulla is seen on axial image 5, not present 2015, but not acute in appearance. This could represent an infarct or artifact. Brainstem is otherwise normal. Cerebellum is normal. Basal cisterns are normal. Ventricles and sulci are prominent but stable. White matter changes are moderate to severe and stable. No acute cortical ischemia or infarct. Vascular: No hyperdense vessel or unexpected calcification. Skull: Normal. Negative for fracture or focal lesion. Sinuses/Orbits: No acute finding. Other: None. IMPRESSION: 1. Chronic white matter changes. No acute intracranial abnormalities identified. Electronically Signed   By: Dorise Bullion III M.D   On: 08/27/2018 15:25   Dg Chest Portable 1 View  Result Date: 08/27/2018 CLINICAL DATA:  Syncopal episode.  Dementia. EXAM: PORTABLE CHEST 1 VIEW COMPARISON:  Chest radiograph 05/20/2018 FINDINGS: Monitoring leads  overlie the patient. Stable enlarged cardiac and mediastinal contours. No consolidative pulmonary opacities. No pleural effusion or pneumothorax. Similar-appearing hilar prominence bilaterally most compatible with dilated main pulmonary artery. IMPRESSION: No acute cardiopulmonary process. Electronically Signed   By: Lovey Newcomer M.D.   On: 08/27/2018 14:03    Pending Labs Unresulted Labs (From admission, onward)    Start     Ordered   08/27/18 1554  Novel Coronavirus,NAA,(SEND-OUT TO REF LAB - TAT 24-48 hrs); Hosp Order  (Asymptomatic Patients Labs)  Once,   STAT    Question:  Rule Out  Answer:  Yes   08/27/18 1553   08/27/18 1333  Urinalysis, Routine w reflex microscopic  ONCE - STAT,   STAT     08/27/18 1332          Vitals/Pain Today's Vitals   08/27/18 1400 08/27/18 1515 08/27/18 1545 08/27/18 1659  BP: (!) 155/74 (!) 163/72 (!) 167/76   Pulse: 61 65 64   Resp: 20 15 20    Temp:      TempSrc:      SpO2: 100% 100% 100%   Weight:      Height:      PainSc:    0-No pain    Isolation Precautions No active isolations  Medications Medications  sodium chloride 0.9 % bolus 1,000 mL (0 mLs Intravenous Stopped 08/27/18 1548)    Mobility walks with person assist High fall risk   Focused Assessments Neuro Assessment Handoff:  Swallow screen pass Cardiac Rhythm: Normal sinus rhythm       Neuro Assessment: Exceptions to WDL Neuro Checks:      Last Documented NIHSS Modified Score:   Has TPA been given? No If patient is a Neuro Trauma and patient is going to OR before floor call report to [facility] nurse: [phone] or [phone]     R Recommendations: See Admitting Provider Note  Report given to:   Additional Notes:

## 2018-08-27 NOTE — ED Triage Notes (Signed)
Patient is being discharged from the Urgent [place] and sent to the Emergency Department via wheelchair by staff. Per [place], [place], patient is stable but in need of higher level of care due to 10 minutes of LOC followed by emesis. Patient/caregiver is aware and verbalizes understanding of plan of care.

## 2018-08-27 NOTE — H&P (Signed)
History and Physical    Nicole Blackwell JSE:[id number] DOB: 1936-01-19 DOA: 08/27/2018  PCP: Billie Ruddy, MD  Patient coming from: [facility]  I have personally briefly reviewed patient's old medical records in [place]  Chief Complaint: Syncope  HPI: Nicole Blackwell is a 83 y.o. female with medical history significant of right heart failure, hypertension, and dementia who presents for a syncopal episode which occurred at church today.  It occurred just prior to arrival.  The patient was outdoors in the shade for about an hour she appeared to lay her head down for about 10 minutes.  There was a 5 to 8-minute period where she was unresponsive to her family but she did not lose postural tone.  When they were able to awaken her she vomited x1.  Her blood pressure was normal per EMS.  She denies any chest pain, shortness of breath, abdominal pain, or further nausea and vomiting.  According to her daughter 3 days ago her in-home physical therapist rheumatic recommended she be evaluated for unsteadiness on her feet.  The daughter thought she had some left-sided weakness but seem to resolve spontaneously.  They went to urgent care where they found that she had an equivocal urine but culture was equivocal but due to multiple species and recollection was recommended.  Than the fatigue patient feels fine.  Dementia.  He has had multiple visits for similar presentations.  An echo on 1 of her prior visits was concerning for pulmonary hypertension.  Outpatient transesophageal echocardiogram and cardiology follow-up was recommended but does not appear to have been obtained.  ED Course: Hayden Pedro consistent with vasovagal syncope, EKG is shows a sinus rhythm with hyperdynamic T waves, acute chest pain, no electrolyte abnormalities, BC consistent with hemoconcentration, hest x-ray without cardiopulmonary abnormalities, CT of the head with white matter microvascular changes.  Referred to medicine for further  evaluation  Review of Systems: Difficult to obtain due to patient's dementia  Past Medical History:  Diagnosis Date   BACK PAIN    Hypertension    OSTEOPOROSIS    PLANTAR FASCIITIS    Positive PPD    SHINGLES    SYMPTOMATIC MENOPAUSAL/FEMALE CLIMACTERIC STATES    Syncope and collapse 12/13/2014   Uterine cancer ([place])     Past Surgical History:  Procedure Laterality Date   CATARACT EXTRACTION, BILATERAL Bilateral    DILATION AND CURETTAGE OF UTERUS  1960's X 2   "I lost 2 children to miscarriage"   [place]      Social History   Social History Narrative   Not on file     reports that she has never smoked. She has never used smokeless tobacco. She reports that she does not drink alcohol or use drugs.  Allergies  Allergen Reactions   Penicillin G Benzathine Swelling    Did it involve swelling of the face/tongue/throat, SOB, or low BP? Unknown Did it involve sudden or severe rash/hives, skin peeling, or any reaction on the inside of your mouth or nose? Unknown Did you need to seek medical attention at a hospital or doctor's office? Unknown When did it last happen?unknown If all above answers are NO, may proceed with cephalosporin use.   Penicillins Swelling    Did it involve swelling of the face/tongue/throat, SOB, or low BP? Unknown Did it involve sudden or severe rash/hives, skin peeling, or any reaction on the inside  of your mouth or nose? Unknown Did you need to seek medical attention at a hospital or doctor's office? Unknown When did it last happen?unknown If all above answers are NO, may proceed with cephalosporin use.    Family History  Problem Relation Age of Onset   Healthy Mother    Healthy Father      Prior to Admission medications   Medication Sig Start Date End Date Taking? Authorizing Provider  acetaminophen (TYLENOL) 325 MG tablet  Take 2 tablets (650 mg total) by mouth every 6 (six) hours as needed for mild pain or headache (or Fever >/= 101). 05/24/18  Yes Fuller Plan A, MD  aspirin EC 81 MG tablet Take 81 mg by mouth at bedtime.   Yes [provider]  donepezil (ARICEPT) 5 MG tablet Take 1 tablet (5 mg total) by mouth at bedtime. 08/04/18  Yes Billie Ruddy, MD  losartan (COZAAR) 25 MG tablet TAKE 1 TABLET(25 MG) BY MOUTH DAILY Patient taking differently: Take 25 mg by mouth daily.  08/19/18  Yes Billie Ruddy, MD  Melatonin 3 MG TABS Take 1 tablet (3 mg total) by mouth at bedtime. 07/15/18  Yes Billie Ruddy, MD  vitamin B-12 (CYANOCOBALAMIN) 100 MCG tablet Take 100 mcg by mouth daily.   Yes [provider]    Physical Exam:  Constitutional: NAD, calm, comfortable, confused Vitals:   08/27/18 1317 08/27/18 1400 08/27/18 1515 08/27/18 1545  BP:  (!) 155/74 (!) 163/72 (!) 167/76  Pulse:  61 65 64  Resp:  20 15 20   Temp: 99 F (37.2 C)     TempSrc: Oral     SpO2:  100% 100% 100%  Weight:      Height:       Eyes: PERRL, lids and conjunctivae normal ENMT: Mucous membranes are dry. Posterior pharynx clear of any exudate or lesions.Normal dentition.  Neck: normal, supple, no masses, no thyromegaly Respiratory: clear to auscultation bilaterally, no wheezing, no crackles. Normal respiratory effort. No accessory muscle use.  Cardiovascular: Regular rate and rhythm, no murmurs / rubs / gallops. No extremity edema. 2+ pedal pulses. No carotid bruits.  Abdomen: no tenderness, no masses palpated. No hepatosplenomegaly. Bowel sounds positive.  Musculoskeletal: no clubbing / cyanosis. No joint deformity upper and lower extremities. Good ROM, no contractures. Normal muscle tone.  Skin: no rashes, lesions, ulcers. No induration Neurologic: CN 2-12 grossly intact. Sensation intact, DTR normal. Strength 5/5 in all 4.  Psychiatric: Alert and oriented to name and location but not to situation day date  time president or state   Labs on Admission: I have personally reviewed following labs and imaging studies  CBC: Recent Labs  Lab 08/27/18 1340  WBC 6.2  NEUTROABS 4.4  HGB 15.0  HCT 48.5*  MCV 81.6  PLT 151   Basic Metabolic Panel: Recent Labs  Lab 08/27/18 1340  NA 138  K 3.8  CL 104  CO2 25  GLUCOSE 106*  BUN 11  CREATININE 0.90  CALCIUM 9.1   GFR: Estimated Creatinine Clearance: 51.2 mL/min (by C-G formula based on SCr of 0.9 mg/dL). Liver Function Tests: Recent Labs  Lab 08/27/18 1340  AST 24  ALT 16  ALKPHOS 69  BILITOT 1.1  PROT 7.0  ALBUMIN 3.5   Cardiac Enzymes: Recent Labs  Lab 08/27/18 1340  CKTOTAL 405*   BNP (last 3 results) Recent Labs    12/15/17 1225  PROBNP 52.0   Urine analysis:    Component Value  Date/Time   COLORURINE STRAW (A) 05/20/2018 1455   APPEARANCEUR CLEAR 05/20/2018 1455   LABSPEC >=1.030 08/24/2018 1936   PHURINE 5.5 08/24/2018 1936   GLUCOSEU 100 (A) 08/24/2018 1936   HGBUR TRACE (A) 08/24/2018 1936   BILIRUBINUR SMALL (A) 08/24/2018 1936   BILIRUBINUR n 01/03/2016 1048   KETONESUR TRACE (A) 08/24/2018 1936   PROTEINUR 100 (A) 08/24/2018 1936   UROBILINOGEN 2.0 (H) 08/24/2018 1936   NITRITE NEGATIVE 08/24/2018 1936   LEUKOCYTESUR TRACE (A) 08/24/2018 1936    Radiological Exams on Admission: Ct Head Wo Contrast  Result Date: 08/27/2018 CLINICAL DATA:  Syncope. EXAM: CT HEAD WITHOUT CONTRAST TECHNIQUE: Contiguous axial images were obtained from the base of the skull through the vertex without intravenous contrast. COMPARISON:  September 25, 2016 FINDINGS: Brain: No subdural, epidural, or subarachnoid hemorrhage. Low-attenuation in the medulla is seen on axial image 5, not present 2015, but not acute in appearance. This could represent an infarct or artifact. Brainstem is otherwise normal. Cerebellum is normal. Basal cisterns are normal. Ventricles and sulci are prominent but stable. White matter changes are moderate to  severe and stable. No acute cortical ischemia or infarct. Vascular: No hyperdense vessel or unexpected calcification. Skull: Normal. Negative for fracture or focal lesion. Sinuses/Orbits: No acute finding. Other: None. IMPRESSION: 1. Chronic white matter changes. No acute intracranial abnormalities identified. Electronically Signed   By: Dorise Bullion III M.D   On: 08/27/2018 15:25   Dg Chest Portable 1 View  Result Date: 08/27/2018 CLINICAL DATA:  Syncopal episode.  Dementia. EXAM: PORTABLE CHEST 1 VIEW COMPARISON:  Chest radiograph 05/20/2018 FINDINGS: Monitoring leads overlie the patient. Stable enlarged cardiac and mediastinal contours. No consolidative pulmonary opacities. No pleural effusion or pneumothorax. Similar-appearing hilar prominence bilaterally most compatible with dilated main pulmonary artery. IMPRESSION: No acute cardiopulmonary process. Electronically Signed   By: Lovey Newcomer M.D.   On: 08/27/2018 14:03    EKG: Independently reviewed.  Sinus rhythm with left anterior fascicular block, abnormal R wave progression and LVH with T wave abnormalities which appear to be new.  Assessment/Plan Principal Problem:   Syncope Active Problems:   Vascular dementia without behavioral disturbance (HCC)   Diastolic CHF (HCC)   Fatigue    1.  Syncope: She will be placed in observation we will check an echocardiogram, the heat today and her presentation will hydrate with normal saline with 20 mEq of potassium at 75 mL/h for 1 L.  Orthostatic vital signs.  Monitor heart rhythm.  Repeat EKG in a.m.  2.  Vascular dementia without behavioral disturbance: Noted confabulates history taking.  3.  Diastolic congestive heart failure: Noted will repeat echocardiogram and evaluate for questionable history of pulmonary hypertension.  4.  Fatigue: May be related to dementia, significant heat today or urinary issue.  Repeat UA is pending.  DVT prophylaxis: Lovenox Code Status: Full code Family  Communication: Personally spoke with patient's daughter Patsy Zaragoza by telephone and updated her on situation and plans.  All questions answered. Disposition Plan: Likely Home in 24 to 48 hours Consults called: None Admission status: Observation It is my clinical opinion that referral for OBSERVATION is reasonable and necessary in this patient based on the above information provided. The aforementioned taken together are felt to place the patient at high risk for further clinical deterioration. However it is anticipated that the patient may be medically stable for discharge from the hospital within 24 to 48 hours.   Lady Deutscher MD FACP [facility] Pager  [phone]  How to contact the [facility] Attending or Consulting provider [place] or covering provider during after hours [place], for this patient?  1. Check the care team in [facility] and look for a) attending/consulting [facility] provider listed and b) the [facility] team listed 2. Log into [email or web address] and use [place]'s universal password to access. If you do not have the password, please contact the hospital operator. 3. Locate the [facility] provider you are looking for under [facility] and page to a number that you can be directly reached. 4. If you still have difficulty reaching the provider, please page the [facility] (Director on Call) for the Hospitalists listed on amion for assistance.  If 7PM-7AM, please contact night-coverage [email or web address] Password [email or web address]  08/27/2018, 5:05 PM

## 2018-08-27 NOTE — ED Provider Notes (Signed)
[facility] EMERGENCY DEPARTMENT Provider Note   CSN: [id number] Arrival date & time: 08/27/18  1301     History   Chief Complaint Chief Complaint  Patient presents with  . Loss of Consciousness    HPI Nicole Blackwell is a 83 y.o. female.     HPI  Patient is an 83 year old female past medical history of right heart failure, hypertension, dementia presenting for syncopal episode.  Patient presents with her daughter who assist in history.  According to family, patient was at a church event just prior to arrival.  She was outdoors in the shade.  She appeared to lay her head down her approximately 10 minutes.  There was a 5 to 8-minute period where she was unresponsive to family however she did not lose postural tone.  When they were able to awaken her, she vomited 1 time.  BP reportedly normal by EMS.  She denied any chest pain, shortness of breath, abdominal pain, or further nausea or vomiting.  Her daughter reports that 3 days ago her in-home PT recommended that patient be evaluated for new onset unsteadiness on her feet.  The daughter thought that she had seen some left-sided weakness but this seemed to spontaneously resolve.  They ultimately went to urgent care where they found that she had equivocal urine, however culture was equivocal for multiple species and recollection was recommended.  Patient reports that other than generalized fatigue she feels asymptomatic currently.  Past Medical History:  Diagnosis Date  . BACK PAIN   . Hypertension   . OSTEOPOROSIS   . PLANTAR FASCIITIS   . Positive PPD   . SHINGLES   . SYMPTOMATIC MENOPAUSAL/FEMALE CLIMACTERIC STATES   . Syncope and collapse 12/13/2014  . Uterine cancer [facility])     Patient Active Problem List   Diagnosis Date Noted  . Protein calorie malnutrition ([place]) 05/24/2018  . Hypoalbuminemia 05/23/2018  . Abnormal EEG 05/22/2018  . Dementia ([place]) 05/21/2018  . Fall 05/20/2018  . Rhabdomyolysis 05/20/2018   . Bilateral lower extremity edema 05/20/2018  . Serum total bilirubin elevated 05/20/2018  . B12 deficiency 01/17/2018  . Vascular dementia without behavioral disturbance ([place]) 01/17/2018  . Laceration of scalp without foreign body   . Diastolic CHF ([place]) 18/84/1660  . Syncope 01/12/2012  . BACK PAIN 04/30/2008  . SHINGLES 09/27/2007  . SYMPTOMATIC MENOPAUSAL/FEMALE CLIMACTERIC STATES 06/20/2007  . Osteoporosis 03/11/2007  . PLANTAR FASCIITIS 11/16/2006    Past Surgical History:  Procedure Laterality Date  . CATARACT EXTRACTION, BILATERAL Bilateral   . DILATION AND CURETTAGE OF UTERUS  1960's X 2   "I lost 2 children to miscarriage"  . LAPAROSCOPIC CHOLECYSTECTOMY    . SIGMOIDOSCOPY    . TONSILLECTOMY    . VAGINAL HYSTERECTOMY       OB History   No obstetric history on file.      Home Medications    Prior to Admission medications   Medication Sig Start Date End Date Taking? Authorizing Provider  acetaminophen (TYLENOL) 325 MG tablet Take 2 tablets (650 mg total) by mouth every 6 (six) hours as needed for mild pain or headache (or Fever >/= 101). 05/24/18   Norval Morton, MD  aspirin EC 81 MG tablet Take 81 mg by mouth at bedtime.    [provider]  donepezil (ARICEPT) 5 MG tablet Take 1 tablet (5 mg total) by mouth at bedtime. 08/04/18   Billie Ruddy, MD  feeding supplement, ENSURE ENLIVE, (ENSURE ENLIVE) LIQD  Take 237 mLs by mouth 2 (two) times daily between meals. 05/24/18   Norval Morton, MD  irbesartan (AVAPRO) 75 MG tablet Take 1 tablet (75 mg total) by mouth daily. 05/24/18   Norval Morton, MD  losartan (COZAAR) 25 MG tablet TAKE 1 TABLET(25 MG) BY MOUTH DAILY 08/19/18   Billie Ruddy, MD  Melatonin 3 MG TABS Take 1 tablet (3 mg total) by mouth at bedtime. 07/15/18   Billie Ruddy, MD    Family History Family History  Problem Relation Age of Onset  . Healthy Mother   . Healthy Father     Social History Social History   Tobacco Use   . Smoking status: Never Smoker  . Smokeless tobacco: Never Used  Substance Use Topics  . Alcohol use: No  . Drug use: No     Allergies   Penicillin g benzathine and Penicillins   Review of Systems Review of Systems  Constitutional: Positive for fatigue. Negative for chills and fever.  HENT: Negative for congestion and rhinorrhea.   Respiratory: Negative for chest tightness and shortness of breath.   Cardiovascular: Negative for chest pain.  Gastrointestinal: Positive for nausea and vomiting. Negative for abdominal pain.  Neurological: Positive for syncope and light-headedness. Negative for weakness.  All other systems reviewed and are negative.    Physical Exam Updated Vital Signs BP (!) 155/74   Pulse 61   Temp 99 F (37.2 C) (Oral)   Resp 20   Ht 5\' 9"  (1.753 m)   Wt 68.4 kg   SpO2 100%   BMI 22.27 kg/m   Physical Exam Vitals signs and nursing note reviewed.  Constitutional:      General: She is not in acute distress.    Appearance: She is well-developed.  HENT:     Head: Normocephalic and atraumatic.     Mouth/Throat:     Mouth: Mucous membranes are moist.  Eyes:     Extraocular Movements: Extraocular movements intact.     Conjunctiva/sclera: Conjunctivae normal.     Pupils: Pupils are equal, round, and reactive to light.  Neck:     Musculoskeletal: Normal range of motion and neck supple.  Cardiovascular:     Rate and Rhythm: Normal rate and regular rhythm.     Heart sounds: S1 normal and S2 normal. No murmur.  Pulmonary:     Effort: Pulmonary effort is normal.     Breath sounds: Normal breath sounds. No wheezing or rales.  Abdominal:     General: There is no distension.     Palpations: Abdomen is soft.     Tenderness: There is no abdominal tenderness. There is no guarding.  Musculoskeletal: Normal range of motion.        General: No deformity.  Lymphadenopathy:     Cervical: No cervical adenopathy.  Skin:    General: Skin is warm and dry.      Findings: No erythema or rash.  Neurological:     Mental Status: She is alert.     Comments: Mental Status:  Alert, oriented, thought content appropriate, able to give a coherent history. Speech fluent without evidence of aphasia. Able to follow 2 step commands without difficulty.  Cranial Nerves:  II:  Peripheral visual fields grossly normal, pupils equal, round, reactive to light III,IV, VI: ptosis not present, extra-ocular motions intact bilaterally  V,VII: smile symmetric, facial light touch sensation equal VIII: hearing grossly normal to voice  X: uvula elevates symmetrically  XI: bilateral shoulder  shrug symmetric and strong XII: midline tongue extension without fassiculations Motor:  Normal tone. 5/5 in upper and lower extremities bilaterally including strong and equal grip strength and dorsiflexion/plantar flexion Sensory: Light touch normal in all extremities.  Cerebellar: normal finger-to-nose with bilateral upper extremities Stance: No pronator drift and good coordination, strength, and position sense with tapping of bilateral arms (performed in sitting position). CV: distal pulses palpable throughout    Psychiatric:        Behavior: Behavior normal.        Thought Content: Thought content normal.        Judgment: Judgment normal.      ED Treatments / Results  Labs (all labs ordered are listed, but only abnormal results are displayed) Labs Reviewed  COMPREHENSIVE METABOLIC PANEL - Abnormal; Notable for the following components:      Result Value   Glucose, Bld 106 (*)    GFR calc non Af Amer 60 (*)    All other components within normal limits  CBC WITH DIFFERENTIAL/PLATELET - Abnormal; Notable for the following components:   RBC 5.94 (*)    HCT 48.5 (*)    MCH 25.3 (*)    RDW 16.5 (*)    All other components within normal limits  CK - Abnormal; Notable for the following components:   Total CK 405 (*)    All other components within normal limits  TROPONIN I  (HIGH SENSITIVITY)  TROPONIN I (HIGH SENSITIVITY)  URINALYSIS, ROUTINE W REFLEX MICROSCOPIC    EKG EKG Interpretation  Date/Time:  Saturday August 27 2018 13:18:16 EDT Ventricular Rate:  62 PR Interval:    QRS Duration: 107 QT Interval:  457 QTC Calculation: 465 R Axis:   -71 Text Interpretation:  Sinus rhythm Left anterior fascicular block Abnormal R-wave progression, late transition Left ventricular hypertrophy Abnrm T, probable ischemia, anterolateral lds peaked t waves, similar to prior but more pronounced new flipped t wave in aVL and I Otherwise no significant change Confirmed by Deno Etienne [phone]) on 08/27/2018 2:30:56 PM   Radiology Ct Head Wo Contrast  Result Date: 08/27/2018 CLINICAL DATA:  Syncope. EXAM: CT HEAD WITHOUT CONTRAST TECHNIQUE: Contiguous axial images were obtained from the base of the skull through the vertex without intravenous contrast. COMPARISON:  September 25, 2016 FINDINGS: Brain: No subdural, epidural, or subarachnoid hemorrhage. Low-attenuation in the medulla is seen on axial image 5, not present 2015, but not acute in appearance. This could represent an infarct or artifact. Brainstem is otherwise normal. Cerebellum is normal. Basal cisterns are normal. Ventricles and sulci are prominent but stable. White matter changes are moderate to severe and stable. No acute cortical ischemia or infarct. Vascular: No hyperdense vessel or unexpected calcification. Skull: Normal. Negative for fracture or focal lesion. Sinuses/Orbits: No acute finding. Other: None. IMPRESSION: 1. Chronic white matter changes. No acute intracranial abnormalities identified. Electronically Signed   By: Dorise Bullion III M.D   On: 08/27/2018 15:25   Dg Chest Portable 1 View  Result Date: 08/27/2018 CLINICAL DATA:  Syncopal episode.  Dementia. EXAM: PORTABLE CHEST 1 VIEW COMPARISON:  Chest radiograph 05/20/2018 FINDINGS: Monitoring leads overlie the patient. Stable enlarged cardiac and mediastinal  contours. No consolidative pulmonary opacities. No pleural effusion or pneumothorax. Similar-appearing hilar prominence bilaterally most compatible with dilated main pulmonary artery. IMPRESSION: No acute cardiopulmonary process. Electronically Signed   By: Lovey Newcomer M.D.   On: 08/27/2018 14:03    Procedures Procedures (including critical care time)  Medications Ordered in ED Medications  sodium chloride 0.9 % bolus 1,000 mL (1,000 mLs Intravenous New Bag/Given 08/27/18 1407)     Initial Impression / Assessment and Plan / ED Course  I have reviewed the triage vital signs and the nursing notes.  Pertinent labs & imaging results that were available during my care of the patient were reviewed by me and considered in my medical decision making (see chart for details).  Clinical Course as of Aug 26 1612  Sat Aug 27, 2018  1614 Spoke with Dr. Evangeline Gula who will admit pt. Appreciate her involvement.    [AM]    Clinical Course User Index [AM] Albesa Seen, PA-C       DDx includes: Orthostatic hypotension Vasovagal syncope Stroke Vertebral artery dissection/stenosis Dysrhythmia PE Vasovagal/neurocardiogenic syncope Aortic stenosis Valvular disorder/Cardiomyopathy Anemia  Patient reports feeling generally tired, but asymptomatic.  Low suspicion for PE.  Patient had similar presentation in March.  She had positive d-dimer and CT angiogram was performed, which was negative.  Vital signs are stable, patient is not tachypneic, tachycardic or hypoxic.  EKG normal sinus rhythm with hyperdynamic T waves, overall consistent with prior. She does have deep TWI in AVR and AVL.  Negative troponin.  No active chest pain here in the ED.  No electrolyte abnormalities.  CBG 106.  CBC with slight elevation hematocrit, possible hemoconcentration.  Chest x-ray, reviewed by me with normal heart contours without cardiopulmonary disease.  No widened mediastinum.  CT head with chronic white matter  microvascular changes.  Patient's history possibly consistent with vasovagal syncope especially with vomiting.  She has had previous echocardiogram showing right heart failure has not previously been worked up.  Patient evaluated by Dr. Deno Etienne and case discussed with attending physician, will admit patient for further workup.   This is a shared visit with Dr. Deno Etienne. Patient was independently evaluated by this attending physician. Attending physician consulted in evaluation and admission management.  Final Clinical Impressions(s) / ED Diagnoses   Final diagnoses:  Syncope and collapse  Elevated blood pressure reading    ED Discharge Orders    None       Tamala Julian 08/27/18 [place], [place], DO 08/27/18 1619

## 2018-08-27 NOTE — ED Triage Notes (Signed)
Pt from outdoor church function where pt had a syncopal episode that lasted approx 10 minutes, eyes up, unresponsive; pt's daughter states she was sitting and laid head on table; per pt's daughter, pt c/o leg weakness earlier in the week, was evaluated at urgent care; denies pain; hx dementia, CHF; denies CP, sob, blurry vision, denies weakness

## 2018-08-28 ENCOUNTER — Observation Stay (HOSPITAL_BASED_OUTPATIENT_CLINIC_OR_DEPARTMENT_OTHER): Payer: Medicare (Managed Care)

## 2018-08-28 DIAGNOSIS — I361 Nonrheumatic tricuspid (valve) insufficiency: Secondary | ICD-10-CM | POA: Diagnosis not present

## 2018-08-28 DIAGNOSIS — I371 Nonrheumatic pulmonary valve insufficiency: Secondary | ICD-10-CM

## 2018-08-28 DIAGNOSIS — R55 Syncope and collapse: Secondary | ICD-10-CM | POA: Diagnosis not present

## 2018-08-28 LAB — BASIC METABOLIC PANEL
Anion gap: 9 (ref 5–15)
BUN: 7 mg/dL — ABNORMAL LOW (ref 8–23)
CO2: 25 mmol/L (ref 22–32)
Calcium: 8.9 mg/dL (ref 8.9–10.3)
Chloride: 105 mmol/L (ref 98–111)
Creatinine, Ser: 0.69 mg/dL (ref 0.44–1.00)
GFR calc Af Amer: >60 mL/min (ref >60)
GFR calc non Af Amer: >60 mL/min (ref >60)
Glucose, Bld: 82 mg/dL (ref 70–99)
Potassium: 3.7 mmol/L (ref 3.5–5.1)
Sodium: 139 mmol/L (ref 135–145)

## 2018-08-28 LAB — CBC
HCT: 47.7 % — ABNORMAL HIGH (ref 36.0–46.0)
Hemoglobin: 14.4 g/dL (ref 12.0–15.0)
MCH: 24.7 pg — ABNORMAL LOW (ref 26.0–34.0)
MCHC: 30.2 g/dL (ref 30.0–36.0)
MCV: 81.7 fL (ref 80.0–100.0)
Platelets: 242 10*3/uL (ref 150–400)
RBC: 5.84 MIL/uL — ABNORMAL HIGH (ref 3.87–5.11)
RDW: 16.3 % — ABNORMAL HIGH (ref 11.5–15.5)
WBC: 6.2 10*3/uL (ref 4.0–10.5)
nRBC: 0 % (ref 0.0–0.2)

## 2018-08-28 LAB — GLUCOSE, CAPILLARY: Glucose-Capillary: 83 mg/dL (ref 70–99)

## 2018-08-28 LAB — NOVEL CORONAVIRUS, NAA (HOSP ORDER, SEND-OUT TO REF LAB; TAT 18-24 HRS): SARS-CoV-2, NAA: NOT DETECTED

## 2018-08-28 LAB — ECHOCARDIOGRAM COMPLETE
Height: 69 in
Weight: 2411.2 oz

## 2018-08-28 NOTE — Progress Notes (Signed)
  Echocardiogram 2D Echocardiogram has been performed.  Nicole Blackwell 08/28/2018, 1:42 PM

## 2018-08-28 NOTE — Evaluation (Signed)
Physical Therapy Evaluation Patient Details Name: Nicole Blackwell MRN: [id number] DOB: 02-19-1936 Today's Date: 08/28/2018   History of Present Illness  Pt adm after syncopal episode at church. PMH - dementia, back pain, osteoporosis, shingles, syncope, Uterine cancer, heart failure  Clinical Impression  Pt close to baseline with mobility. Recommend return home with daughter and to resume her HHPT services. Spoke with daughter [name].     Follow Up Recommendations Home health PT(resume HHPT)    Equipment Recommendations  None recommended by PT    Recommendations for Other Services       Precautions / Restrictions Precautions Precautions: Fall      Mobility  Bed Mobility Overal bed mobility: Needs Assistance Bed Mobility: Supine to Sit     Supine to sit: Supervision     General bed mobility comments: Incr time  Transfers Overall transfer level: Needs assistance Equipment used: None Transfers: Sit to/from Stand Sit to Stand: Min guard         General transfer comment: Assist for safety  Ambulation/Gait Ambulation/Gait assistance: Supervision Gait Distance (Feet): 200 Feet Assistive device: Rolling walker (2 wheeled);4-wheeled walker;None Gait Pattern/deviations: Step-through pattern;Decreased step length - right;Decreased step length - left;Shuffle Gait velocity: decr Gait velocity interpretation: <1.31 ft/sec, indicative of household ambulator General Gait Details: Assist for safety. Tried amb with walkers and then without assistive device. Walkers didn't improve gait and pt hesistant to use. Verbal cues to incr speed and step length with pt able to do both if cued.  Stairs            Wheelchair Mobility    Modified Rankin (Stroke Patients Only)       Balance Overall balance assessment: Needs assistance Sitting-balance support: No upper extremity supported;Feet supported Sitting balance-Leahy Scale: Good     Standing balance support: No upper  extremity supported;During functional activity Standing balance-Leahy Scale: Good                               Pertinent Vitals/Pain Pain Assessment: No/denies pain    Home Living Family/patient expects to be discharged to:: Private residence Living Arrangements: Children(daughter [name]) Available Help at Discharge: Family(daughter can assist as needed) Type of Home: House         Home Equipment: None      Prior Function Level of Independence: Needs assistance   Gait / Transfers Assistance Needed: Amb modified independent without assistive device           Hand Dominance   Dominant Hand: Right    Extremity/Trunk Assessment   Upper Extremity Assessment Upper Extremity Assessment: Defer to OT evaluation    Lower Extremity Assessment Lower Extremity Assessment: Generalized weakness       Communication   Communication: No difficulties  Cognition Arousal/Alertness: Awake/alert Behavior During Therapy: WFL for tasks assessed/performed Overall Cognitive Status: History of cognitive impairments - at baseline                                        General Comments      Exercises     Assessment/Plan    PT Assessment Patient needs continued PT services  PT Problem List Decreased strength;Decreased activity tolerance;Decreased balance;Decreased mobility       PT Treatment Interventions Gait training;Functional mobility training;Therapeutic activities;Therapeutic exercise;Balance training;Patient/family education    PT Goals (Current goals can be  found in the Care Plan section)  Acute Rehab PT Goals Patient Stated Goal: go home PT Goal Formulation: With patient Time For Goal Achievement: 09/04/18 Potential to Achieve Goals: Good    Frequency Min 3X/week   Barriers to discharge        Co-evaluation               AM-PAC PT "6 Clicks" Mobility  Outcome Measure Help needed turning from your back to your side while  in a flat bed without using bedrails?: None Help needed moving from lying on your back to sitting on the side of a flat bed without using bedrails?: None Help needed moving to and from a bed to a chair (including a wheelchair)?: A Little Help needed standing up from a chair using your arms (e.g., wheelchair or bedside chair)?: A Little Help needed to walk in hospital room?: A Little Help needed climbing 3-5 steps with a railing? : A Little 6 Click Score: 20    End of Session Equipment Utilized During Treatment: Gait belt Activity Tolerance: Patient tolerated treatment well Patient left: in chair;with call bell/phone within reach;with chair alarm set Nurse Communication: Mobility status;Other (comment)(daughter requested call) PT Visit Diagnosis: Unsteadiness on feet (R26.81);Muscle weakness (generalized) (M62.81)    Time: 5465-0354 PT Time Calculation (min) (ACUTE ONLY): 26 min   Charges:   PT Evaluation $PT Eval Low Complexity: 1 Low PT Treatments $Gait Training: 8-22 mins        [place] Pager [phone] Office [place] 08/28/2018, 9:28 AM

## 2018-08-28 NOTE — Progress Notes (Signed)
Patient alert, back to her baseline, v/s stable , iv and tele d/c, d/c instruction explain to the daughter on phone, instruction place in patient belonging bag and given to the patient. Pt. D/c home per order.

## 2018-08-28 NOTE — Discharge Summary (Signed)
Physician Discharge Summary  ALLYSSA ABRUZZESE HER:[id number] DOB: 1935-08-16 DOA: 08/27/2018  PCP: Billie Ruddy, MD  Admit date: 08/27/2018 Discharge date: 08/28/2018  Admitted From: home Discharge disposition: home   Recommendations for Outpatient Follow-Up:   1. Resume health   Discharge Diagnosis:   Principal Problem:   Syncope Active Problems:   Diastolic CHF ([place])   Vascular dementia without behavioral disturbance (HCC)   Fatigue    Discharge Condition: Improved.  Diet recommendation:  Regular.  Wound care: None.  Code status: Full.   History of Present Illness:    Nicole Blackwell is a 83 y.o. female with medical history significant of right heart failure, hypertension, and dementia who presents for a syncopal episode which occurred at church today.  It occurred just prior to arrival.  The patient was outdoors in the shade for about an hour she appeared to lay her head down for about 10 minutes.  There was a 5 to 8-minute period where she was unresponsive to her family but she did not lose postural tone.  When they were able to awaken her she vomited x1.  Her blood pressure was normal per EMS.  She denies any chest pain, shortness of breath, abdominal pain, or further nausea and vomiting.  According to her daughter 3 days ago her in-home physical therapist rheumatic recommended she be evaluated for unsteadiness on her feet.  The daughter thought she had some left-sided weakness but seem to resolve spontaneously.  They went to urgent care where they found that she had an equivocal urine but culture was equivocal but due to multiple species and recollection was recommended.  Than the fatigue patient feels fine.  Dementia.  He has had multiple visits for similar presentations.  An echo on 1 of her prior visits was concerning for pulmonary hypertension.  Outpatient transesophageal echocardiogram and cardiology follow-up was recommended but does not appear to have been  obtained.   Hospital Course by Problem:   1.  Syncope:  -from heat exposure -IVF-- much improved  2.  Vascular dementia without behavioral disturbance  3.  Diastolic congestive heart failure:  -repeat echo: The left ventricle has normal systolic function, with an ejection fraction of 60-65%. The cavity size was normal. There is moderate concentric left ventricular hypertrophy. Left ventricular diastolic Doppler parameters are consistent  with pseudonormalization. Normal left ventricular filling pressures  4.  Fatigue: May be related to dementia, significant heat today or urinary issue.  -appears resolved   Medical Consultants:      Discharge Exam:   Vitals:   08/28/18 0400 08/28/18 1125  BP: (!) 168/90 126/69  Pulse: 66 69  Resp:    Temp: 98.2 F (36.8 C)   SpO2: 100%    Vitals:   08/28/18 0000 08/28/18 0400 08/28/18 0500 08/28/18 1125  BP: (!) 164/80 (!) 168/90  126/69  Pulse: (!) 59 66  69  Resp:      Temp: 98.6 F (37 C) 98.2 F (36.8 C)    TempSrc: Oral Oral    SpO2: 95% 100%    Weight:   68.4 kg   Height:        General exam: Appears calm and comfortable.   The results of significant diagnostics from this hospitalization (including imaging, microbiology, ancillary and laboratory) are listed below for reference.     Procedures and Diagnostic Studies:   Ct Head Wo Contrast  Result Date: 08/27/2018 CLINICAL DATA:  Syncope. EXAM: CT HEAD WITHOUT  CONTRAST TECHNIQUE: Contiguous axial images were obtained from the base of the skull through the vertex without intravenous contrast. COMPARISON:  September 25, 2016 FINDINGS: Brain: No subdural, epidural, or subarachnoid hemorrhage. Low-attenuation in the medulla is seen on axial image 5, not present 2015, but not acute in appearance. This could represent an infarct or artifact. Brainstem is otherwise normal. Cerebellum is normal. Basal cisterns are normal. Ventricles and sulci are prominent but stable. White matter  changes are moderate to severe and stable. No acute cortical ischemia or infarct. Vascular: No hyperdense vessel or unexpected calcification. Skull: Normal. Negative for fracture or focal lesion. Sinuses/Orbits: No acute finding. Other: None. IMPRESSION: 1. Chronic white matter changes. No acute intracranial abnormalities identified. Electronically Signed   By: Dorise Bullion III M.D   On: 08/27/2018 15:25   Dg Chest Portable 1 View  Result Date: 08/27/2018 CLINICAL DATA:  Syncopal episode.  Dementia. EXAM: PORTABLE CHEST 1 VIEW COMPARISON:  Chest radiograph 05/20/2018 FINDINGS: Monitoring leads overlie the patient. Stable enlarged cardiac and mediastinal contours. No consolidative pulmonary opacities. No pleural effusion or pneumothorax. Similar-appearing hilar prominence bilaterally most compatible with dilated main pulmonary artery. IMPRESSION: No acute cardiopulmonary process. Electronically Signed   By: Lovey Newcomer M.D.   On: 08/27/2018 14:03     Labs:   Basic Metabolic Panel: Recent Labs  Lab 08/27/18 1340 08/28/18 0408  NA 138 139  K 3.8 3.7  CL 104 105  CO2 25 25  GLUCOSE 106* 82  BUN 11 7*  CREATININE 0.90 0.69  CALCIUM 9.1 8.9   GFR Estimated Creatinine Clearance: 57.6 mL/min (by C-G formula based on SCr of 0.69 mg/dL). Liver Function Tests: Recent Labs  Lab 08/27/18 1340  AST 24  ALT 16  ALKPHOS 69  BILITOT 1.1  PROT 7.0  ALBUMIN 3.5   No results for input(s): LIPASE, AMYLASE in the last 168 hours. No results for input(s): AMMONIA in the last 168 hours. Coagulation profile No results for input(s): INR, PROTIME in the last 168 hours.  CBC: Recent Labs  Lab 08/27/18 1340 08/28/18 0408  WBC 6.2 6.2  NEUTROABS 4.4  --   HGB 15.0 14.4  HCT 48.5* 47.7*  MCV 81.6 81.7  PLT 239 242   Cardiac Enzymes: Recent Labs  Lab 08/27/18 1340  CKTOTAL 405*   BNP: Invalid input(s): POCBNP CBG: Recent Labs  Lab 08/28/18 0643  GLUCAP 83   D-Dimer No results  for input(s): DDIMER in the last 72 hours. Hgb A1c No results for input(s): HGBA1C in the last 72 hours. Lipid Profile No results for input(s): CHOL, HDL, LDLCALC, TRIG, CHOLHDL, LDLDIRECT in the last 72 hours. Thyroid function studies Recent Labs    08/27/18 1822  TSH 1.452   Anemia work up No results for input(s): VITAMINB12, FOLATE, FERRITIN, TIBC, IRON, RETICCTPCT in the last 72 hours. Microbiology Recent Results (from the past 240 hour(s))  Urine culture     Status: Abnormal   Collection Time: 08/24/18  7:51 PM   Specimen: Urine, Random  Result Value Ref Range Status   Specimen Description URINE, RANDOM  Final   Special Requests   Final    NONE Performed at [facility], [address] [place], [place] [address]    Culture MULTIPLE SPECIES PRESENT, SUGGEST RECOLLECTION (A)  Final   Report Status 08/25/2018 FINAL  Final     Discharge Instructions:   Discharge Instructions    Diet - low sodium heart healthy   Complete by: As  directed    Discharge instructions   Complete by: As directed    Continue to encourage hydration and avoid being outside in the heat and humidity   Increase activity slowly   Complete by: As directed      Allergies as of 08/28/2018      Reactions   Penicillin G Benzathine Swelling   Did it involve swelling of the face/tongue/throat, SOB, or low BP? Unknown Did it involve sudden or severe rash/hives, skin peeling, or any reaction on the inside of your mouth or nose? Unknown Did you need to seek medical attention at a hospital or doctor's office? Unknown When did it last happen?unknown If all above answers are NO, may proceed with cephalosporin use.   Penicillins Swelling   Did it involve swelling of the face/tongue/throat, SOB, or low BP? Unknown Did it involve sudden or severe rash/hives, skin peeling, or any reaction on the inside of your mouth or nose? Unknown Did you need to seek medical attention at a hospital or doctor's  office? Unknown When did it last happen?unknown If all above answers are NO, may proceed with cephalosporin use.      Medication List    TAKE these medications   acetaminophen 325 MG tablet Commonly known as: TYLENOL Take 2 tablets (650 mg total) by mouth every 6 (six) hours as needed for mild pain or headache (or Fever >/= 101).   aspirin EC 81 MG tablet Take 81 mg by mouth at bedtime.   donepezil 5 MG tablet Commonly known as: Aricept Take 1 tablet (5 mg total) by mouth at bedtime.   losartan 25 MG tablet Commonly known as: COZAAR TAKE 1 TABLET(25 MG) BY MOUTH DAILY What changed: See the new instructions.   Melatonin 3 MG Tabs Take 1 tablet (3 mg total) by mouth at bedtime.   vitamin B-12 100 MCG tablet Commonly known as: CYANOCOBALAMIN Take 100 mcg by mouth daily.      Follow-up Information    Billie Ruddy, MD Follow up in 1 week(s).   Specialty: Family Medicine Contact information: [address] [place] [address] [phone]            Time coordinating discharge: 25 min  Signed:  Geradine Girt DO  [facility] 08/28/2018, 3:06 PM

## 2018-08-29 ENCOUNTER — Telehealth: Payer: Self-pay | Admitting: *Deleted

## 2018-08-29 NOTE — Telephone Encounter (Signed)
Attempted to contact patient. No answer, and not able to LVM.

## 2018-08-31 DIAGNOSIS — E440 Moderate protein-calorie malnutrition: Secondary | ICD-10-CM | POA: Diagnosis not present

## 2018-08-31 DIAGNOSIS — M810 Age-related osteoporosis without current pathological fracture: Secondary | ICD-10-CM | POA: Diagnosis not present

## 2018-08-31 DIAGNOSIS — I5030 Unspecified diastolic (congestive) heart failure: Secondary | ICD-10-CM | POA: Diagnosis not present

## 2018-08-31 DIAGNOSIS — D519 Vitamin B12 deficiency anemia, unspecified: Secondary | ICD-10-CM | POA: Diagnosis not present

## 2018-08-31 DIAGNOSIS — F0280 Dementia in other diseases classified elsewhere without behavioral disturbance: Secondary | ICD-10-CM | POA: Diagnosis not present

## 2018-08-31 DIAGNOSIS — G309 Alzheimer's disease, unspecified: Secondary | ICD-10-CM | POA: Diagnosis not present

## 2018-08-31 DIAGNOSIS — Z8542 Personal history of malignant neoplasm of other parts of uterus: Secondary | ICD-10-CM | POA: Diagnosis not present

## 2018-08-31 DIAGNOSIS — I110 Hypertensive heart disease with heart failure: Secondary | ICD-10-CM | POA: Diagnosis not present

## 2018-08-31 DIAGNOSIS — M6282 Rhabdomyolysis: Secondary | ICD-10-CM | POA: Diagnosis not present

## 2018-09-06 DIAGNOSIS — D519 Vitamin B12 deficiency anemia, unspecified: Secondary | ICD-10-CM | POA: Diagnosis not present

## 2018-09-06 DIAGNOSIS — I5030 Unspecified diastolic (congestive) heart failure: Secondary | ICD-10-CM | POA: Diagnosis not present

## 2018-09-06 DIAGNOSIS — M6282 Rhabdomyolysis: Secondary | ICD-10-CM | POA: Diagnosis not present

## 2018-09-06 DIAGNOSIS — I110 Hypertensive heart disease with heart failure: Secondary | ICD-10-CM | POA: Diagnosis not present

## 2018-09-06 DIAGNOSIS — G309 Alzheimer's disease, unspecified: Secondary | ICD-10-CM | POA: Diagnosis not present

## 2018-09-06 DIAGNOSIS — M810 Age-related osteoporosis without current pathological fracture: Secondary | ICD-10-CM | POA: Diagnosis not present

## 2018-09-06 DIAGNOSIS — F0280 Dementia in other diseases classified elsewhere without behavioral disturbance: Secondary | ICD-10-CM | POA: Diagnosis not present

## 2018-09-06 DIAGNOSIS — E440 Moderate protein-calorie malnutrition: Secondary | ICD-10-CM | POA: Diagnosis not present

## 2018-09-06 DIAGNOSIS — Z8542 Personal history of malignant neoplasm of other parts of uterus: Secondary | ICD-10-CM | POA: Diagnosis not present

## 2018-09-13 DIAGNOSIS — D519 Vitamin B12 deficiency anemia, unspecified: Secondary | ICD-10-CM | POA: Diagnosis not present

## 2018-09-13 DIAGNOSIS — M810 Age-related osteoporosis without current pathological fracture: Secondary | ICD-10-CM | POA: Diagnosis not present

## 2018-09-13 DIAGNOSIS — I110 Hypertensive heart disease with heart failure: Secondary | ICD-10-CM | POA: Diagnosis not present

## 2018-09-13 DIAGNOSIS — I5030 Unspecified diastolic (congestive) heart failure: Secondary | ICD-10-CM | POA: Diagnosis not present

## 2018-09-13 DIAGNOSIS — Z8542 Personal history of malignant neoplasm of other parts of uterus: Secondary | ICD-10-CM | POA: Diagnosis not present

## 2018-09-13 DIAGNOSIS — E440 Moderate protein-calorie malnutrition: Secondary | ICD-10-CM | POA: Diagnosis not present

## 2018-09-13 DIAGNOSIS — M6282 Rhabdomyolysis: Secondary | ICD-10-CM | POA: Diagnosis not present

## 2018-09-13 DIAGNOSIS — G309 Alzheimer's disease, unspecified: Secondary | ICD-10-CM | POA: Diagnosis not present

## 2018-09-13 DIAGNOSIS — F0280 Dementia in other diseases classified elsewhere without behavioral disturbance: Secondary | ICD-10-CM | POA: Diagnosis not present

## 2018-09-14 ENCOUNTER — Telehealth: Payer: Self-pay

## 2018-09-14 NOTE — Telephone Encounter (Signed)
[facility] from [facility] [phone], no notes, sent to scheduling

## 2018-09-16 ENCOUNTER — Other Ambulatory Visit: Payer: Self-pay | Admitting: Family Medicine

## 2018-10-06 ENCOUNTER — Telehealth: Payer: Self-pay

## 2018-10-06 NOTE — Telephone Encounter (Signed)
REFERRAL ONLY FROM [facility] [phone],  AND IT WAS SENT TO [facility]

## 2018-10-18 ENCOUNTER — Ambulatory Visit: Payer: Medicare (Managed Care) | Admitting: Cardiovascular Disease

## 2018-11-15 ENCOUNTER — Ambulatory Visit: Payer: Medicare (Managed Care) | Admitting: Cardiovascular Disease

## 2018-12-08 ENCOUNTER — Encounter: Payer: Self-pay | Admitting: Cardiovascular Disease

## 2018-12-08 ENCOUNTER — Ambulatory Visit (INDEPENDENT_AMBULATORY_CARE_PROVIDER_SITE_OTHER): Payer: Medicare (Managed Care) | Admitting: Cardiovascular Disease

## 2018-12-08 ENCOUNTER — Other Ambulatory Visit: Payer: Self-pay

## 2018-12-08 DIAGNOSIS — R55 Syncope and collapse: Secondary | ICD-10-CM | POA: Diagnosis not present

## 2018-12-08 NOTE — Patient Instructions (Signed)
Medication Instructions:  Your physician recommends that you continue on your current medications as directed. Please refer to the Current Medication list given to you today.  If you need a refill on your cardiac medications before your next appointment, please call your pharmacy.   Lab work: none If you have labs (blood work) drawn today and your tests are completely normal, you will receive your results only by: Marland Kitchen MyChart Message (if you have MyChart) OR . A paper copy in the mail If you have any lab test that is abnormal or we need to change your treatment, we will call you to review the results.  Testing/Procedures: none  Follow-Up: At [facility], you and your health needs are our priority.  As part of our continuing mission to provide you with exceptional heart care, we have created designated Provider Care Teams.  These Care Teams include your primary Cardiologist (physician) and Advanced Practice Providers (APPs -  Physician Assistants and Nurse Practitioners) who all work together to provide you with the care you need, when you need it. . You will need a follow up appointment as needed. You may see Dr. Gwenlyn Found or one of the following Advanced Practice Providers on your designated Care Team:   . Kerin Ransom, PA-C . Daleen Snook Kroeger, PA-C . Sande Rives, PA-C

## 2018-12-08 NOTE — Assessment & Plan Note (Signed)
Nicole Blackwell was referred by [facility] for evaluation of syncope.  This occurred in July.  She was at church.  She had similar episodes in the past evaluated by Dr. Curt Bears 12/14/2014 who did not think there was an arrhythmogenic cause.  He thought that she was volume depleted.  She did have a 2D echo in 2013 that showed normal LV function with diastolic dysfunction.  She denies chest pain or shortness of breath.  Her exam is benign.  No further work-up is required at this time.

## 2018-12-08 NOTE — Progress Notes (Signed)
12/08/2018 Nicole Blackwell   January 20, 1936  [id number]  Primary Physician Billie Ruddy, MD Primary Cardiologist: Lorretta Harp MD Lupe Car[place], [place]  HPI:  Nicole Blackwell is a 83 y.o. thin, frail and chronically ill-appearing divorced African-American female mother of 1 daughter named Nicole Blackwell who accompanies her today.  She was referred by [facility] for evaluation of syncope.  She has had recurrent syncope in the past.  She was just hospitalized 08/27/2018 for 1 day.  Her work-up was unremarkable.  She does have vascular dementia.  She lives alone.  The diagnosis was that she had syncope from "heat exposure.  She is had an echo in the past revealing normal LV systolic function with diastolic dysfunction.  She denies chest pain or shortness of breath.   Current Meds  Medication Sig  . acetaminophen (TYLENOL) 325 MG tablet Take 2 tablets (650 mg total) by mouth every 6 (six) hours as needed for mild pain or headache (or Fever >/= 101).  Marland Kitchen aspirin EC 81 MG tablet Take 81 mg by mouth at bedtime.  Marland Kitchen losartan (COZAAR) 25 MG tablet TAKE 1 TABLET(25 MG) BY MOUTH DAILY  . Melatonin 3 MG TABS Take 1 tablet (3 mg total) by mouth at bedtime.  . vitamin B-12 (CYANOCOBALAMIN) 100 MCG tablet Take 100 mcg by mouth daily.     Allergies  Allergen Reactions  . Penicillin G Benzathine Swelling    Did it involve swelling of the face/tongue/throat, SOB, or low BP? Unknown Did it involve sudden or severe rash/hives, skin peeling, or any reaction on the inside of your mouth or nose? Unknown Did you need to seek medical attention at a hospital or doctor's office? Unknown When did it last happen?unknown If all above answers are "NO", may proceed with cephalosporin use.  Marland Kitchen Penicillins Swelling    Did it involve swelling of the face/tongue/throat, SOB, or low BP? Unknown Did it involve sudden or severe rash/hives, skin peeling, or any reaction on the inside of your mouth or nose? Unknown Did you need to  seek medical attention at a hospital or doctor's office? Unknown When did it last happen?unknown If all above answers are "NO", may proceed with cephalosporin use.    Social History   Socioeconomic History  . Marital status: Divorced    Spouse name: Not on file  . Number of children: Not on file  . Years of education: Not on file  . Highest education level: Not on file  Occupational History  . Not on file  Social Needs  . Financial resource strain: Not on file  . Food insecurity    Worry: Not on file    Inability: Not on file  . Transportation needs    Medical: Not on file    Non-medical: Not on file  Tobacco Use  . Smoking status: Never Smoker  . Smokeless tobacco: Never Used  Substance and Sexual Activity  . Alcohol use: No  . Drug use: No  . Sexual activity: Never  Lifestyle  . Physical activity    Days per week: Not on file    Minutes per session: Not on file  . Stress: Not on file  Relationships  . Social Herbalist on phone: Not on file    Gets together: Not on file    Attends religious service: Not on file    Active member of club or organization: Not on file    Attends meetings of clubs  or organizations: Not on file    Relationship status: Not on file  . Intimate partner violence    Fear of current or ex partner: Not on file    Emotionally abused: Not on file    Physically abused: Not on file    Forced sexual activity: Not on file  Other Topics Concern  . Not on file  Social History Narrative  . Not on file     Review of Systems: General: negative for chills, fever, night sweats or weight changes.  Cardiovascular: negative for chest pain, dyspnea on exertion, edema, orthopnea, palpitations, paroxysmal nocturnal dyspnea or shortness of breath Dermatological: negative for rash Respiratory: negative for cough or wheezing Urologic: negative for hematuria Abdominal: negative for nausea, vomiting, diarrhea, bright red blood per rectum,  melena, or hematemesis Neurologic: negative for visual changes, syncope, or dizziness All other systems reviewed and are otherwise negative except as noted above.    Blood pressure (!) 142/72, pulse 71, temperature 97.9 F (36.6 C), temperature source Temporal, height 5' 9.5" (1.765 m), weight 155 lb (70.3 kg), SpO2 99 %.  General appearance: alert and no distress Neck: no adenopathy, no carotid bruit, no JVD, supple, symmetrical, trachea midline and thyroid not enlarged, symmetric, no tenderness/mass/nodules Lungs: clear to auscultation bilaterally Heart: regular rate and rhythm, S1, S2 normal, no murmur, click, rub or gallop Extremities: extremities normal, atraumatic, no cyanosis or edema Pulses: 2+ and symmetric Skin: Skin color, texture, turgor normal. No rashes or lesions Neurologic: Alert and oriented X 3, normal strength and tone. Normal symmetric reflexes. Normal coordination and gait  EKG not performed today  ASSESSMENT AND PLAN:   Syncope [name] was referred by [facility] for evaluation of syncope.  This occurred in July.  She was at church.  She had similar episodes in the past evaluated by Dr. Curt Bears 12/14/2014 who did not think there was an arrhythmogenic cause.  He thought that she was volume depleted.  She did have a 2D echo in 2013 that showed normal LV function with diastolic dysfunction.  She denies chest pain or shortness of breath.  Her exam is benign.  No further work-up is required at this time.      Lorretta Harp MD FACP,FACC,FAHA, [facility] 12/08/2018 2:49 PM

## 2018-12-17 ENCOUNTER — Inpatient Hospital Stay (HOSPITAL_COMMUNITY): Payer: Medicare (Managed Care)

## 2018-12-17 ENCOUNTER — Other Ambulatory Visit: Payer: Self-pay

## 2018-12-17 ENCOUNTER — Emergency Department (HOSPITAL_COMMUNITY): Payer: Medicare (Managed Care)

## 2018-12-17 ENCOUNTER — Inpatient Hospital Stay (HOSPITAL_COMMUNITY)
Admission: EM | Admit: 2018-12-17 | Discharge: 2018-12-19 | DRG: 071 | Disposition: A | Discharge status: Home / Self Care | Payer: Medicare (Managed Care) | Attending: Internal Medicine | Admitting: Internal Medicine

## 2018-12-17 DIAGNOSIS — W19XXXA Unspecified fall, initial encounter: Secondary | ICD-10-CM | POA: Diagnosis not present

## 2018-12-17 DIAGNOSIS — Y92239 Unspecified place in hospital as the place of occurrence of the external cause: Secondary | ICD-10-CM | POA: Diagnosis not present

## 2018-12-17 DIAGNOSIS — G9341 Metabolic encephalopathy: Secondary | ICD-10-CM | POA: Diagnosis present

## 2018-12-17 DIAGNOSIS — R9401 Abnormal electroencephalogram [EEG]: Secondary | ICD-10-CM | POA: Diagnosis present

## 2018-12-17 DIAGNOSIS — R112 Nausea with vomiting, unspecified: Secondary | ICD-10-CM | POA: Diagnosis present

## 2018-12-17 DIAGNOSIS — Z20828 Contact with and (suspected) exposure to other viral communicable diseases: Secondary | ICD-10-CM | POA: Diagnosis present

## 2018-12-17 DIAGNOSIS — Z880 Allergy status to penicillin: Secondary | ICD-10-CM

## 2018-12-17 DIAGNOSIS — J690 Pneumonitis due to inhalation of food and vomit: Secondary | ICD-10-CM | POA: Diagnosis not present

## 2018-12-17 DIAGNOSIS — R4781 Slurred speech: Secondary | ICD-10-CM | POA: Diagnosis present

## 2018-12-17 DIAGNOSIS — I447 Left bundle-branch block, unspecified: Secondary | ICD-10-CM | POA: Diagnosis present

## 2018-12-17 DIAGNOSIS — Z90710 Acquired absence of both cervix and uterus: Secondary | ICD-10-CM

## 2018-12-17 DIAGNOSIS — R4182 Altered mental status, unspecified: Secondary | ICD-10-CM | POA: Diagnosis present

## 2018-12-17 DIAGNOSIS — Z7982 Long term (current) use of aspirin: Secondary | ICD-10-CM

## 2018-12-17 DIAGNOSIS — E876 Hypokalemia: Secondary | ICD-10-CM | POA: Diagnosis not present

## 2018-12-17 DIAGNOSIS — F0150 Vascular dementia without behavioral disturbance: Secondary | ICD-10-CM | POA: Diagnosis present

## 2018-12-17 DIAGNOSIS — R29701 NIHSS score 1: Secondary | ICD-10-CM | POA: Diagnosis present

## 2018-12-17 DIAGNOSIS — I110 Hypertensive heart disease with heart failure: Secondary | ICD-10-CM | POA: Diagnosis present

## 2018-12-17 DIAGNOSIS — F0390 Unspecified dementia without behavioral disturbance: Secondary | ICD-10-CM | POA: Diagnosis present

## 2018-12-17 DIAGNOSIS — R32 Unspecified urinary incontinence: Secondary | ICD-10-CM | POA: Diagnosis present

## 2018-12-17 DIAGNOSIS — Z79899 Other long term (current) drug therapy: Secondary | ICD-10-CM

## 2018-12-17 DIAGNOSIS — I5030 Unspecified diastolic (congestive) heart failure: Secondary | ICD-10-CM | POA: Diagnosis present

## 2018-12-17 DIAGNOSIS — G9340 Encephalopathy, unspecified: Secondary | ICD-10-CM

## 2018-12-17 DIAGNOSIS — I5032 Chronic diastolic (congestive) heart failure: Secondary | ICD-10-CM | POA: Diagnosis present

## 2018-12-17 DIAGNOSIS — Z8542 Personal history of malignant neoplasm of other parts of uterus: Secondary | ICD-10-CM | POA: Diagnosis not present

## 2018-12-17 DIAGNOSIS — M810 Age-related osteoporosis without current pathological fracture: Secondary | ICD-10-CM | POA: Diagnosis present

## 2018-12-17 LAB — COMPREHENSIVE METABOLIC PANEL
ALT: 14 U/L (ref 0–44)
AST: 21 U/L (ref 15–41)
Albumin: 3.4 g/dL — ABNORMAL LOW (ref 3.5–5.0)
Alkaline Phosphatase: 65 U/L (ref 38–126)
Anion gap: 11 (ref 5–15)
BUN: 15 mg/dL (ref 8–23)
CO2: 25 mmol/L (ref 22–32)
Calcium: 9 mg/dL (ref 8.9–10.3)
Chloride: 104 mmol/L (ref 98–111)
Creatinine, Ser: 1.12 mg/dL — ABNORMAL HIGH (ref 0.44–1.00)
GFR calc Af Amer: 53 mL/min — ABNORMAL LOW (ref >60)
GFR calc non Af Amer: 46 mL/min — ABNORMAL LOW (ref >60)
Glucose, Bld: 173 mg/dL — ABNORMAL HIGH (ref 70–99)
Potassium: 3.5 mmol/L (ref 3.5–5.1)
Sodium: 140 mmol/L (ref 135–145)
Total Bilirubin: 1.3 mg/dL — ABNORMAL HIGH (ref 0.3–1.2)
Total Protein: 6.7 g/dL (ref 6.5–8.1)

## 2018-12-17 LAB — DIFFERENTIAL
Abs Immature Granulocytes: 0.02 10*3/uL (ref 0.00–0.07)
Basophils Absolute: 0 10*3/uL (ref 0.0–0.1)
Basophils Relative: 0 %
Eosinophils Absolute: 0 10*3/uL (ref 0.0–0.5)
Eosinophils Relative: 0 %
Immature Granulocytes: 0 %
Lymphocytes Relative: 33 %
Lymphs Abs: 2.2 10*3/uL (ref 0.7–4.0)
Monocytes Absolute: 0.3 10*3/uL (ref 0.1–1.0)
Monocytes Relative: 4 %
Neutro Abs: 4.1 10*3/uL (ref 1.7–7.7)
Neutrophils Relative %: 63 %

## 2018-12-17 LAB — PROTIME-INR
INR: 1 (ref 0.8–1.2)
Prothrombin Time: 13.2 seconds (ref 11.4–15.2)

## 2018-12-17 LAB — CBC
HCT: 48.3 % — ABNORMAL HIGH (ref 36.0–46.0)
Hemoglobin: 14.7 g/dL (ref 12.0–15.0)
MCH: 25.7 pg — ABNORMAL LOW (ref 26.0–34.0)
MCHC: 30.4 g/dL (ref 30.0–36.0)
MCV: 84.4 fL (ref 80.0–100.0)
Platelets: 261 10*3/uL (ref 150–400)
RBC: 5.72 MIL/uL — ABNORMAL HIGH (ref 3.87–5.11)
RDW: 15.9 % — ABNORMAL HIGH (ref 11.5–15.5)
WBC: 6.7 10*3/uL (ref 4.0–10.5)
nRBC: 0 % (ref 0.0–0.2)

## 2018-12-17 LAB — I-STAT CHEM 8, ED
BUN: 17 mg/dL (ref 8–23)
Calcium, Ion: 1.13 mmol/L — ABNORMAL LOW (ref 1.15–1.40)
Chloride: 102 mmol/L (ref 98–111)
Creatinine, Ser: 1 mg/dL (ref 0.44–1.00)
Glucose, Bld: 163 mg/dL — ABNORMAL HIGH (ref 70–99)
HCT: 49 % — ABNORMAL HIGH (ref 36.0–46.0)
Hemoglobin: 16.7 g/dL — ABNORMAL HIGH (ref 12.0–15.0)
Potassium: 3.5 mmol/L (ref 3.5–5.1)
Sodium: 141 mmol/L (ref 135–145)
TCO2: 25 mmol/L (ref 22–32)

## 2018-12-17 LAB — CBG MONITORING, ED: Glucose-Capillary: 164 mg/dL — ABNORMAL HIGH (ref 70–99)

## 2018-12-17 LAB — TROPONIN I (HIGH SENSITIVITY): Troponin I (High Sensitivity): 4 ng/L (ref <18)

## 2018-12-17 LAB — APTT: aPTT: 28 seconds (ref 24–36)

## 2018-12-17 LAB — SARS CORONAVIRUS 2 (TAT 6-24 HRS): SARS Coronavirus 2: NEGATIVE

## 2018-12-17 IMAGING — DX DG CHEST 1V PORT SAME DAY
1 series · 1 of 1 positions shown · non-contrast
Comparison: [DATE]

CLINICAL DATA: Altered mental status

EXAM:
PORTABLE CHEST 1 VIEW

[chest ap]
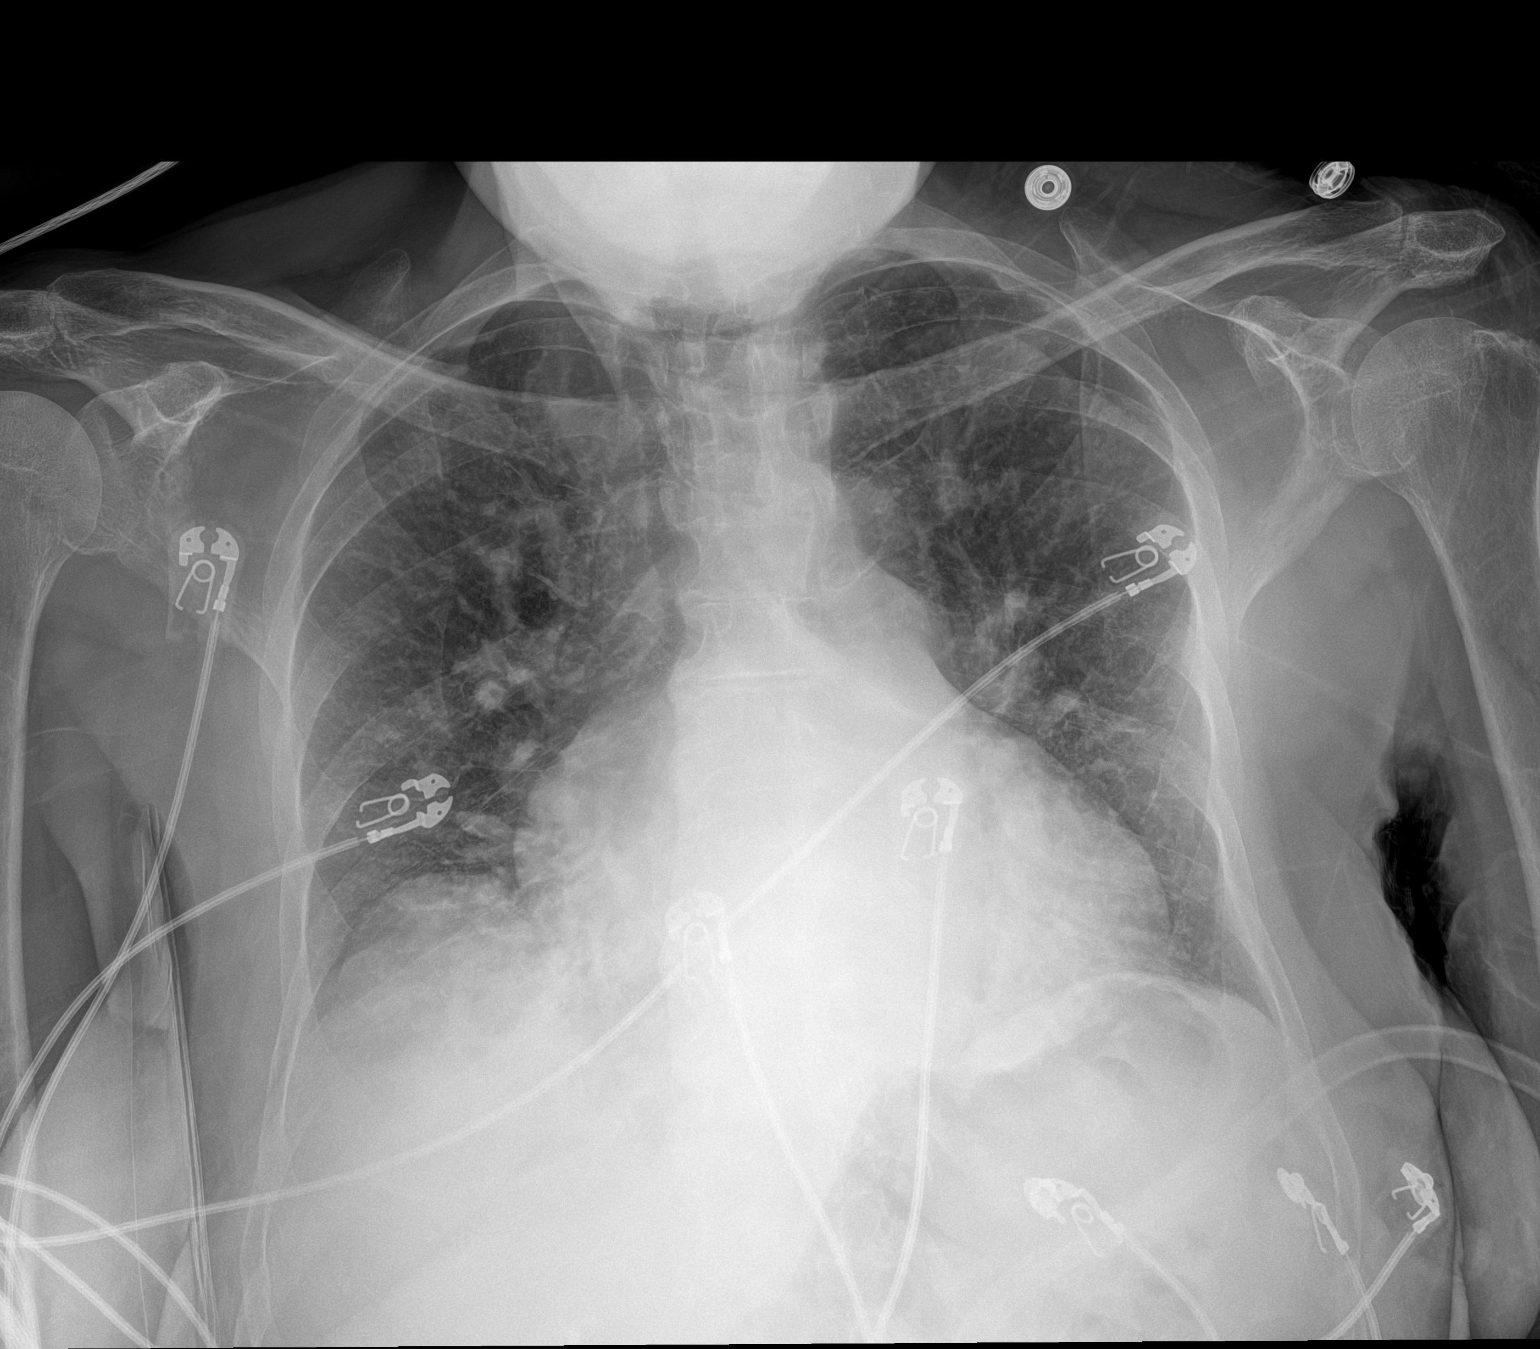

[1 of 1 positions shown; findings below may reference images not displayed]

FINDINGS: Cardiomegaly. Both lungs are clear. The visualized skeletal
structures are unremarkable.
IMPRESSION: Cardiomegaly without acute abnormality of the lungs in AP portable
projection.

## 2018-12-17 IMAGING — CT CT HEAD CODE STROKE
4 series · 16 of 47 positions shown, 18 images · non-contrast
Comparison: CT head [DATE]

CLINICAL DATA: Code stroke.  Focal neuro deficit.  Slurred speech.

EXAM:
CT HEAD WITHOUT CONTRAST
TECHNIQUE: Contiguous axial images were obtained from the base of the skull
through the vertex without intravenous contrast.

[Series 3: head wo · axial · 0.44mm/px · z∈[-56,+48]mm · 6 of 31 slices shown, 8 images]
[im 5/31  brain]
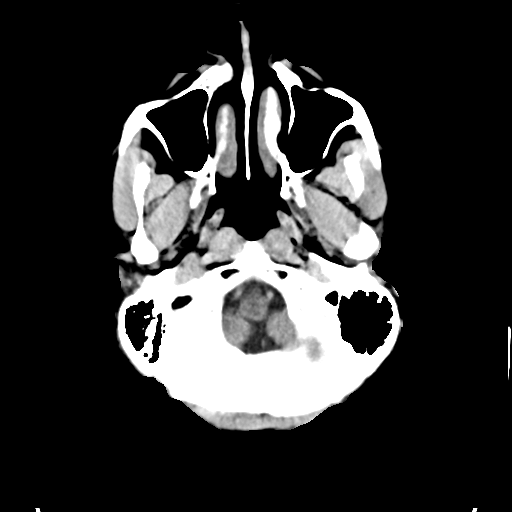
[im 5/31  bone]
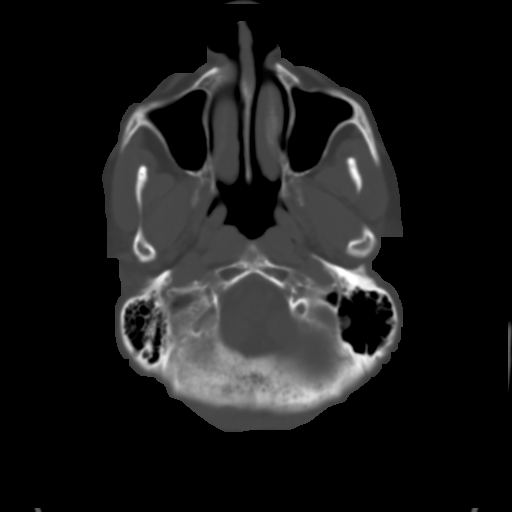
[im 9/31  brain]
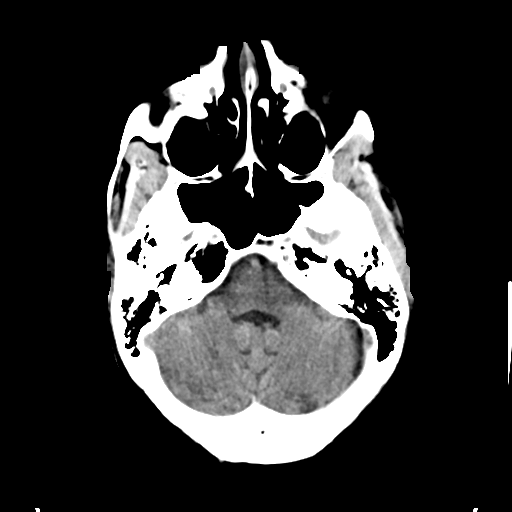
[im 13/31  brain]
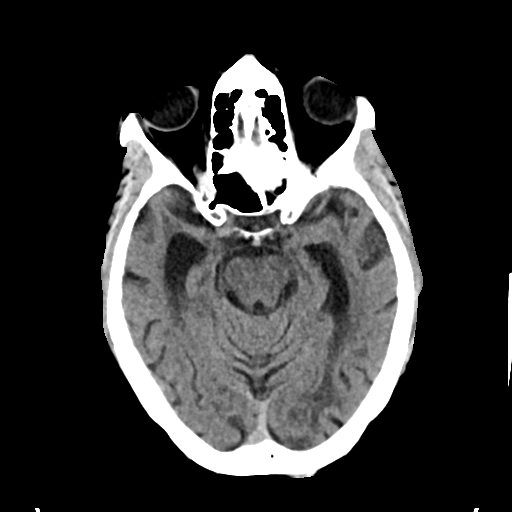
[im 18/31  brain]
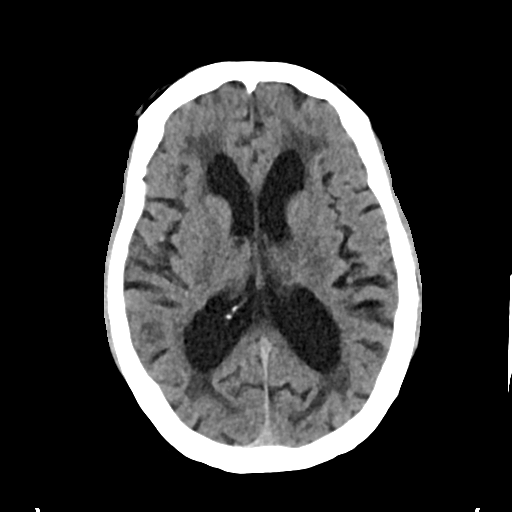
[im 22/31  brain]
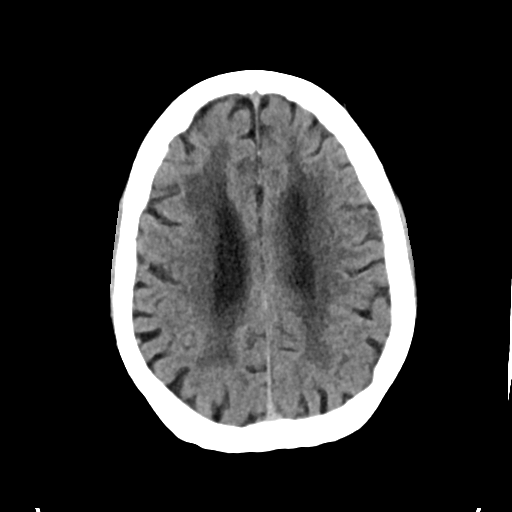
[im 22/31  bone]
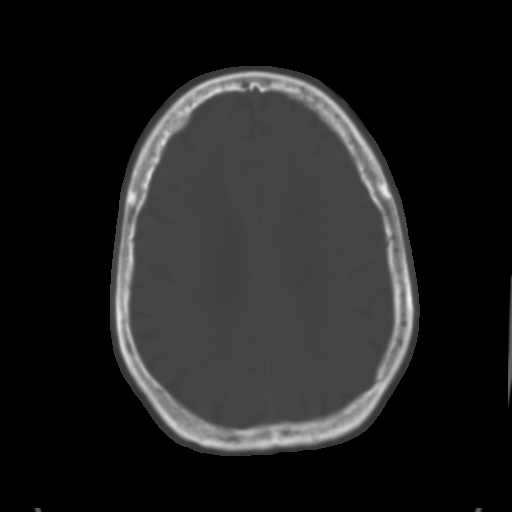
[im 26/31  brain]
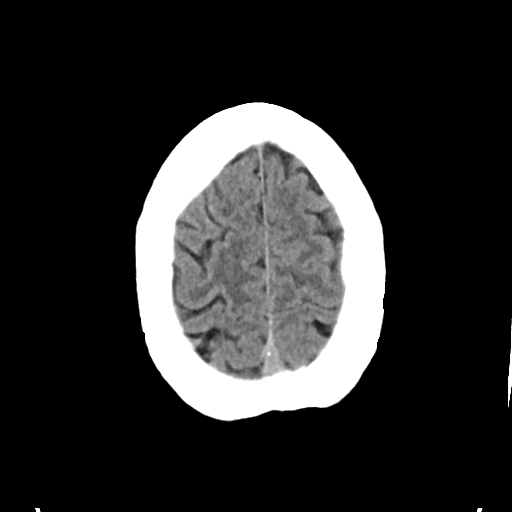

[Series 4: head bone · axial · 0.44mm/px · z∈[-62,-10]mm · 4 of 80 slices shown]
[im 8/80  bone]
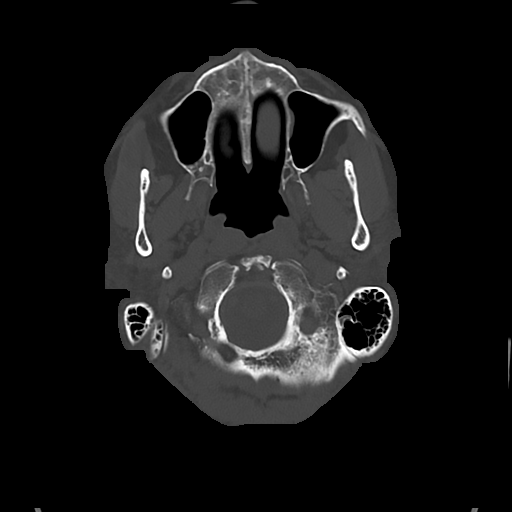
[im 16/80  bone]
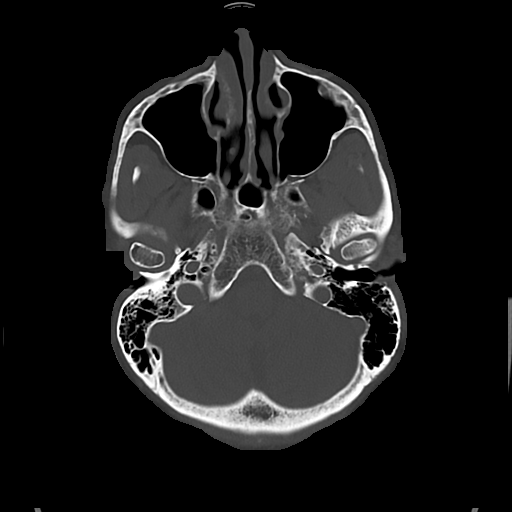
[im 27/80  bone]
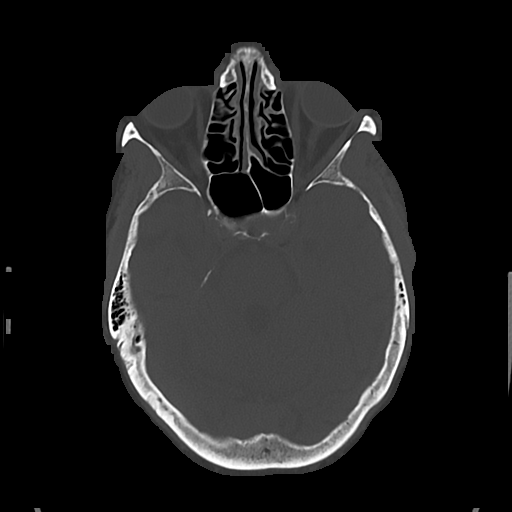
[im 34/80  bone]
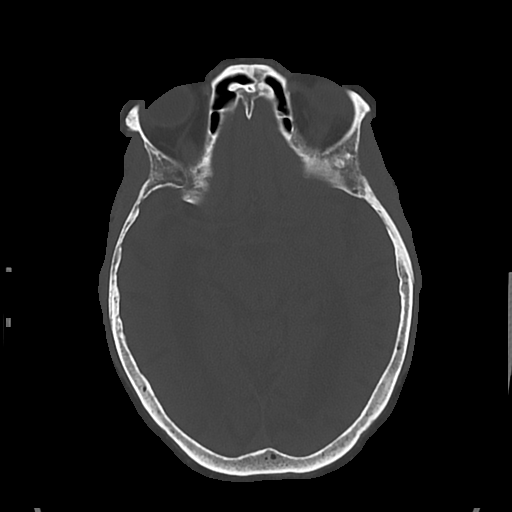

[Series 5: cor soft · coronal · 0.34mm/px · 3 of 70 slices shown]
[im 24/70  brain]
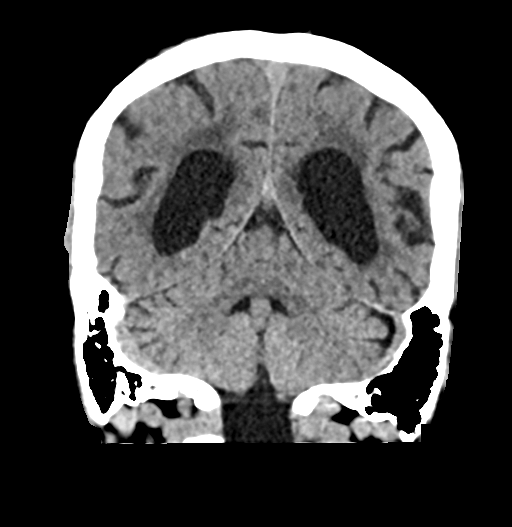
[im 31/70  brain]
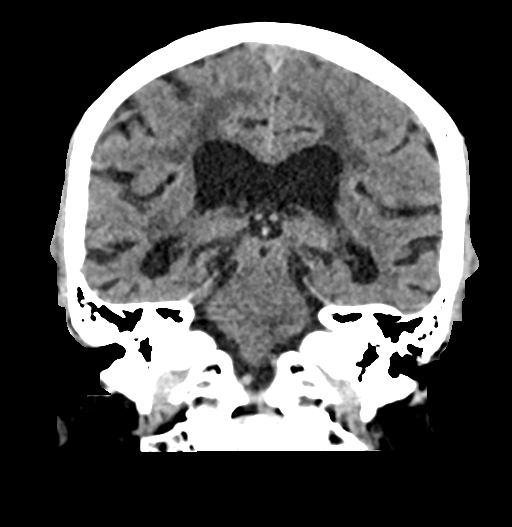
[im 39/70  brain]
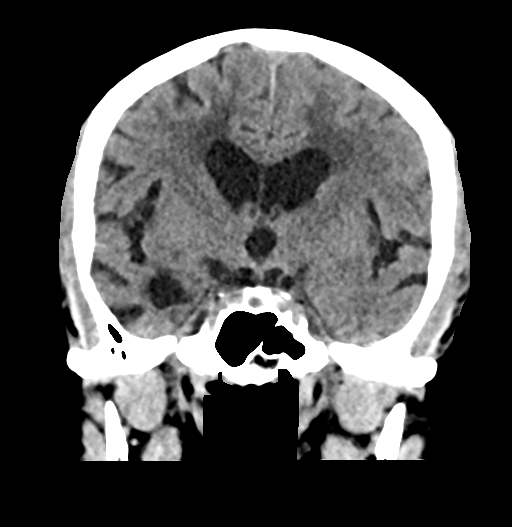

[Series 6: sag soft · sagittal · 0.37mm/px · 3 of 55 slices shown]
[im 19/55  brain]
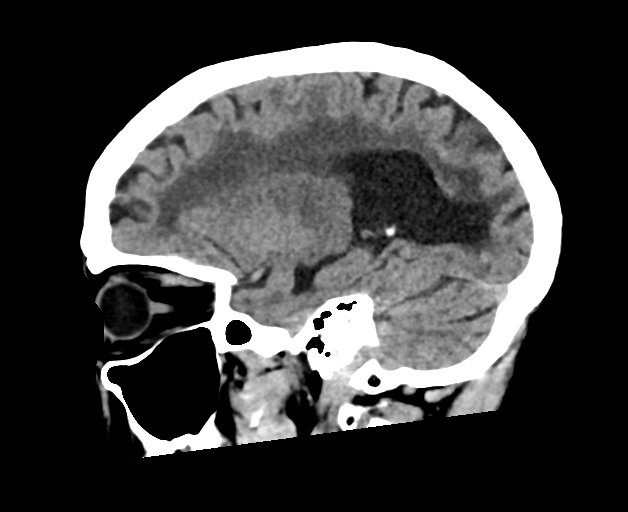
[im 28/55  brain]
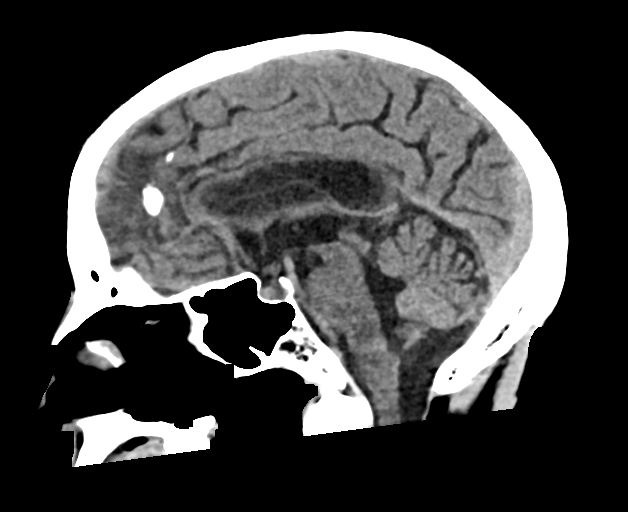
[im 37/55  brain]
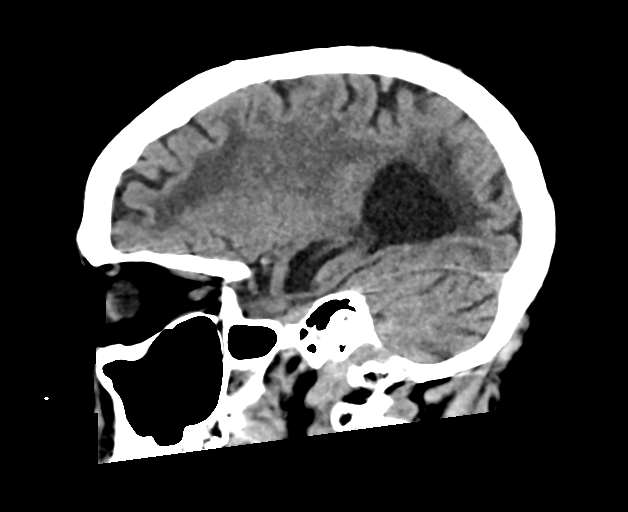

[16 of 47 positions shown; findings below may reference images not displayed]

FINDINGS: Brain: Ventricular enlargement and generalized atrophy stable from
the prior study. Cerebral white matter disease bilaterally appears
chronic and stable. Hypodensity in the medulla likely is artifactual
and is different than on the prior study.

Negative for acute cortical infarct. Negative for hemorrhage or
mass. No midline shift.

Vascular: Negative for hyperdense vessel

Skull: Negative skull.  Large torus palatini.

Sinuses/Orbits: Mild mucosal edema paranasal sinuses. Bilateral
cataract surgery.

Other: None

ASPECTS (Alberta Stroke Program Early CT Score)

- Ganglionic level infarction (caudate, lentiform nuclei, internal
capsule, insula, M1-M3 cortex): 7

- Supraganglionic infarction (M4-M6 cortex): 3

Total score (0-10 with 10 being normal): 10
IMPRESSION: 1. No acute abnormality
2. ASPECTS is 10
3. Atrophy and chronic ischemic changes throughout the white matter.
No change from the prior study.
4. Findings texted to Dr. KARL OVE

## 2018-12-17 MED ORDER — SODIUM CHLORIDE 0.9% FLUSH: 3.0000 mL | Freq: Once | INTRAVENOUS | Status: DC

## 2018-12-17 MED ORDER — METRONIDAZOLE IN NACL 5-0.79 MG/ML-% IV SOLN
500.0000 mg | Freq: Three times a day (TID) | INTRAVENOUS | Status: DC
  Administered 2018-12-17 – 2018-12-19 (×6): 500 mg via INTRAVENOUS
  Filled 2018-12-17 (×6): qty 100

## 2018-12-17 MED ORDER — LORAZEPAM 2 MG/ML IJ SOLN
INTRAMUSCULAR | Status: AC
  Filled 2018-12-17: qty 1

## 2018-12-17 MED ORDER — SODIUM CHLORIDE 0.9 % IV SOLN
2.0000 g | Freq: Two times a day (BID) | INTRAVENOUS | Status: DC
  Administered 2018-12-17 – 2018-12-19 (×5): 2 g via INTRAVENOUS
  Filled 2018-12-17 (×6): qty 2

## 2018-12-17 MED ORDER — LORAZEPAM 2 MG/ML IJ SOLN
2.0000 mg | Freq: Once | INTRAMUSCULAR | Status: AC
  Administered 2018-12-17: 13:00:00 2 mg via INTRAVENOUS

## 2018-12-17 NOTE — ED Notes (Signed)
Attempted report 

## 2018-12-17 NOTE — ED Triage Notes (Signed)
Per [facility], pt was with family and at 39 pt had vomiting episode followed by agitated stated. This was the second vomiting episode that she has had today and normally only has one of these episodes every day. EMS unable to get a good stroke screen due to pt being uncooperative. Pt constantly repeating "stop hurting me" Pt had a stated of incoherence followed by periods of coherence. Code stroke called. Upon arrival here pt is altered, moving all extremities. Still saying "stop hurting me" Also noted to be chewing gum and moving it to each side of her mouth. VSS

## 2018-12-17 NOTE — ED Provider Notes (Signed)
[facility]ERG[facility]Y DEPARTMENT Provider Note   CSN: [id number] Arrival date & time: 12/17/18  1249  An emergency department physician performed an initial assessment on this suspected stroke patient at 1250.  History   Chief Complaint Chief Complaint  Patient presents with  . Code Stroke    HPI Nicole Blackwell is a 83 y.o. female.     HPI Patient has history of episodes whereby she will vomit once and then be agitated for a period of time.  Reportedly, this occurs on a fairly regular basis.  Today however she did that same thing twice and then was staring off and agitated.  At that time EMS was called.  For EMS the patient was uncooperative and they had significant difficulty performing a stroke screen.  Reportedly, she stopped repeating "stop hurting me".  She had waxing and waning periods of incoherence.  Triage note indicates patient was "chewing gum and moving it side to side in her mouth".  Patient required a dose of Ativan to complete stroke work-up and get CT scan done.  At the time of my formal evaluation, the patient was somnolent and not answering any questions to provide additional history or review of systems. Past Medical History:  Diagnosis Date  . BACK PAIN   . Hypertension   . OSTEOPOROSIS   . PLANTAR FASCIITIS   . Positive PPD   . SHINGLES   . SYMPTOMATIC MENOPAUSAL/FEMALE CLIMACTERIC STATES   . Syncope and collapse 12/13/2014  . Uterine cancer [facility])     Patient Active Problem List   Diagnosis Date Noted  . Fatigue 08/27/2018  . Protein calorie malnutrition ([place]) 05/24/2018  . Hypoalbuminemia 05/23/2018  . Abnormal EEG 05/22/2018  . Dementia ([place]) 05/21/2018  . Fall 05/20/2018  . Rhabdomyolysis 05/20/2018  . Bilateral lower extremity edema 05/20/2018  . Serum total bilirubin elevated 05/20/2018  . B12 deficiency 01/17/2018  . Vascular dementia without behavioral disturbance ([place]) 01/17/2018  . Laceration of scalp without foreign body    . Diastolic CHF ([place]) [id number]  . Syncope 01/12/2012  . BACK PAIN 04/30/2008  . SHINGLES 09/27/2007  . SYMPTOMATIC MENOPAUSAL/FEMALE CLIMACTERIC STATES 06/20/2007  . Osteoporosis 03/11/2007  . PLANTAR FASCIITIS 11/16/2006    Past Surgical History:  Procedure Laterality Date  . CATARACT EXTRACTION, BILATERAL Bilateral   . DILATION AND CURETTAGE OF UTERUS  1960's X 2   "I lost 2 children to miscarriage"  . LAPAROSCOPIC CHOLECYSTECTOMY    . SIGMOIDOSCOPY    . TONSILLECTOMY    . VAGINAL HYSTERECTOMY       OB History   No obstetric history on file.      Home Medications    Prior to Admission medications   Medication Sig Start Date End Date Taking? Authorizing Provider  acetaminophen (TYLENOL) 325 MG tablet Take 2 tablets (650 mg total) by mouth every 6 (six) hours as needed for mild pain or headache (or Fever >/= 101). 05/24/18   Norval Morton, MD  aspirin EC 81 MG tablet Take 81 mg by mouth at bedtime.    [provider]  losartan (COZAAR) 25 MG tablet TAKE 1 TABLET(25 MG) BY MOUTH DAILY 09/18/18   Billie Ruddy, MD  Melatonin 3 MG TABS Take 1 tablet (3 mg total) by mouth at bedtime. 07/15/18   Billie Ruddy, MD  vitamin B-12 (CYANOCOBALAMIN) 100 MCG tablet Take 100 mcg by mouth daily.    [provider]    Family History Family History  Problem Relation Age of Onset  . Healthy Mother   . Healthy Father     Social History Social History   Tobacco Use  . Smoking status: Never Smoker  . Smokeless tobacco: Never Used  Substance Use Topics  . Alcohol use: No  . Drug use: No     Allergies   Penicillin g benzathine and Penicillins   Review of Systems Review of Systems Level 5 caveat cannot obtain review of systems due to somnolence and confusion.  Physical Exam Updated Vital Signs BP (!) 153/71   Pulse 70   Temp 98.3 F (36.8 C) (Oral)   Resp 16   Ht 5' 9.5" (1.765 m)   Wt 69 kg   SpO2 100%   BMI 22.14 kg/m   Physical Exam  Constitutional:      Comments: Patient is a thin female.  She is somnolent.  No respiratory distress or difficulty protecting airway.  She awakens slightly but is not answering questions.  HENT:     Head: Normocephalic and atraumatic.     Mouth/Throat:     Pharynx: Oropharynx is clear.  Eyes:     Extraocular Movements: Extraocular movements intact.  Cardiovascular:     Rate and Rhythm: Normal rate and regular rhythm.  Pulmonary:     Effort: Pulmonary effort is normal.     Breath sounds: Normal breath sounds.  Abdominal:     General: There is no distension.     Palpations: Abdomen is soft.     Tenderness: There is no abdominal tenderness. There is no guarding.  Musculoskeletal: Normal range of motion.        General: No tenderness or deformity.  Skin:    General: Skin is warm and dry.  Neurological:     Comments: Patient is very somnolent.  She is not giving verbal responses.  She closes her eyes to resist eye opening.  She follows no commands to test motor strength.  She does move herself somewhat in the stretcher periodically.      ED Treatments / Results  Labs (all labs ordered are listed, but only abnormal results are displayed) Labs Reviewed  CBC - Abnormal; Notable for the following components:      Result Value   RBC 5.72 (*)    HCT 48.3 (*)    MCH 25.7 (*)    RDW 15.9 (*)    All other components within normal limits  COMPREHENSIVE METABOLIC PANEL - Abnormal; Notable for the following components:   Glucose, Bld 173 (*)    Creatinine, Ser 1.12 (*)    Albumin 3.4 (*)    Total Bilirubin 1.3 (*)    GFR calc non Af Amer 46 (*)    GFR calc Af Amer 53 (*)    All other components within normal limits  I-STAT CHEM 8, ED - Abnormal; Notable for the following components:   Glucose, Bld 163 (*)    Calcium, Ion 1.13 (*)    Hemoglobin 16.7 (*)    HCT 49.0 (*)    All other components within normal limits  CBG MONITORING, ED - Abnormal; Notable for the following components:    Glucose-Capillary 164 (*)    All other components within normal limits  SARS CORONAVIRUS 2 (TAT 6-24 HRS)  PROTIME-INR  APTT  DIFFERENTIAL  TROPONIN I (HIGH SENSITIVITY)    EKG No ekg available in Epic. Shows active order but no image stored,  Radiology Ct Head Code Stroke Wo Contrast  Result Date: 12/17/2018  CLINICAL DATA:  Code stroke.  Focal neuro deficit.  Slurred speech. EXAM: CT HEAD WITHOUT CONTRAST TECHNIQUE: Contiguous axial images were obtained from the base of the skull through the vertex without intravenous contrast. COMPARISON:  CT head 08/27/2018 FINDINGS: Brain: Ventricular enlargement and generalized atrophy stable from the prior study. Cerebral white matter disease bilaterally appears chronic and stable. Hypodensity in the medulla likely is artifactual and is different than on the prior study. Negative for acute cortical infarct. Negative for hemorrhage or mass. No midline shift. Vascular: Negative for hyperdense vessel Skull: Negative skull.  Large torus palatini. Sinuses/Orbits: Mild mucosal edema paranasal sinuses. Bilateral cataract surgery. Other: None ASPECTS ([place] Stroke Program Early CT Score) - Ganglionic level infarction (caudate, lentiform nuclei, internal capsule, insula, M1-M3 cortex): 7 - Supraganglionic infarction (M4-M6 cortex): 3 Total score (0-10 with 10 being normal): 10 IMPRESSION: 1. No acute abnormality 2. ASPECTS is 10 3. Atrophy and chronic ischemic changes throughout the white matter. No change from the prior study. 4. Findings texted to Dr. Cheral Marker Electronically Signed   By: Franchot Gallo M.D.   On: 12/17/2018 13:16    Procedures Procedures (including critical care time) No CC time Medications Ordered in ED Medications  sodium chloride flush (NS) 0.9 % injection 3 mL (3 mLs Intravenous Not Given 12/17/18 1335)  LORazepam (ATIVAN) 2 MG/ML injection (  Not Given 12/17/18 1337)  LORazepam (ATIVAN) injection 2 mg (2 mg Intravenous Given  12/17/18 1300)     Initial Impression / Assessment and Plan / ED Course  I have reviewed the triage vital signs and the nursing notes.  Pertinent labs & imaging results that were available during my care of the patient were reviewed by me and considered in my medical decision making (see chart for details).  Clinical Course as of Dec 17 1515  Sat Dec 17, 2018  1409 Consult:[facility] admit   [MP]    Clinical Course User Index [MP] Charlesetta Shanks, MD       Patient arrived via EMS as code stroke.  This was due to waxing and waning incoherent speech backslash level of consciousness.  There were no localizing deficits.  Patient was evaluated by neurology and deemed not a candidate for TPA.  She required 2 mg of Ativan in order to obtain CT scan.  At that point, patient was very sedated and somnolent.  Patient will be admitted to hospitalist service for further evaluation of encephalopathy/mental status change.  Her airway is protected.  She exhibits no respiratory distress.  At time of evaluation, no evident infectious source to start empiric antibiotics.  Broad work-up initiated for mental status change/encephalopathy.  Final Clinical Impressions(s) / ED Diagnoses   Final diagnoses:  Acute encephalopathy  Non-intractable vomiting with nausea, unspecified vomiting type    ED Discharge Orders    None       Charlesetta Shanks, MD 12/19/18 [phone]

## 2018-12-17 NOTE — H&P (Signed)
[facility] H&P    Patient Demographics:    Nicole Blackwell, is a 83 y.o. female  MRN: [id number]  DOB - January 04, 1936  Admit Date - 12/17/2018  Referring MD/NP/PA: Dr. Vallery Ridge  Outpatient Primary MD for the patient is Billie Ruddy, MD  Patient coming from: Home  Chief complaint-altered mental status   HPI:    Nicole Blackwell  is a 83 y.o. female, with history of diastolic CHF, hypertension, dementia was brought to the ED after patient had vomiting episode which was followed by agitation.  This was second vomiting episode for today.  Normally patient has 1 of these episodes every day as per family members.  Notes obtained from the epic.  No family at bedside.  There was question of slurred speech so code stroke was called.  Neurology evaluated the patient and code stroke has been canceled.  Neurology recommended EEG, UA. Patient had another episode of vomiting in the ED. No other history is obtainable. Patient is unable to provide any history.  Lab work in the ED was unremarkable.  EKG showed T wave inversions in lead II, III, I, aVF.  LBBB    Review of systems:    In addition to the HPI above,   All other systems reviewed and are negative.    Past History of the following :    Past Medical History:  Diagnosis Date  . BACK PAIN   . Hypertension   . OSTEOPOROSIS   . PLANTAR FASCIITIS   . Positive PPD   . SHINGLES   . SYMPTOMATIC MENOPAUSAL/FEMALE CLIMACTERIC STATES   . Syncope and collapse 12/13/2014  . Uterine cancer [facility])       Past Surgical History:  Procedure Laterality Date  . CATARACT EXTRACTION, BILATERAL Bilateral   . DILATION AND CURETTAGE OF UTERUS  1960's X 2   "I lost 2 children to miscarriage"  . LAPAROSCOPIC CHOLECYSTECTOMY    . SIGMOIDOSCOPY    . TONSILLECTOMY    . VAGINAL HYSTERECTOMY        Social History:      Social History   Tobacco Use  . Smoking status: Never Smoker   . Smokeless tobacco: Never Used  Substance Use Topics  . Alcohol use: No       Family History :     Family History  Problem Relation Age of Onset  . Healthy Mother   . Healthy Father       Home Medications:   Prior to Admission medications   Medication Sig Start Date End Date Taking? Authorizing Provider  cholecalciferol (VITAMIN D3) 25 MCG (1000 UT) tablet Take 50,000 Units by mouth every Monday.   Yes [provider]  donepezil (ARICEPT) 5 MG tablet Take 5 mg by mouth at bedtime.   Yes [provider]  acetaminophen (TYLENOL) 325 MG tablet Take 2 tablets (650 mg total) by mouth every 6 (six) hours as needed for mild pain or headache (or Fever >/= 101). 05/24/18   Norval Morton, MD  aspirin EC 81 MG tablet Take 81  mg by mouth at bedtime.    [provider]  losartan (COZAAR) 25 MG tablet TAKE 1 TABLET(25 MG) BY MOUTH DAILY 09/18/18   Billie Ruddy, MD  Melatonin 3 MG TABS Take 1 tablet (3 mg total) by mouth at bedtime. 07/15/18   Billie Ruddy, MD  vitamin B-12 (CYANOCOBALAMIN) 100 MCG tablet Take 100 mcg by mouth daily.    [provider]     Allergies:     Allergies  Allergen Reactions  . Penicillin G Benzathine Swelling    Did it involve swelling of the face/tongue/throat, SOB, or low BP? Unknown Did it involve sudden or severe rash/hives, skin peeling, or any reaction on the inside of your mouth or nose? Unknown Did you need to seek medical attention at a hospital or doctor's office? Unknown When did it last happen?unknown If all above answers are "NO", may proceed with cephalosporin use.  Marland Kitchen Penicillins Swelling    Did it involve swelling of the face/tongue/throat, SOB, or low BP? Unknown Did it involve sudden or severe rash/hives, skin peeling, or any reaction on the inside of your mouth or nose? Unknown Did you need to seek medical attention at a hospital or doctor's office? Unknown When did it last  happen?unknown If all above answers are "NO", may proceed with cephalosporin use.     Physical Exam:   Vitals  Blood pressure (!) 153/82, pulse 79, temperature 98.3 F (36.8 C), temperature source Oral, resp. rate 16, height 5' 9.5" (1.765 m), weight 69 kg, SpO2 100 %.  1.  General: Appears in no acute distress  2. Psychiatric: Stuporous, hard to arouse  3. Neurologic: Stuporous, moving all extremities  4. HEENMT:  Atraumatic normocephalic  5. Respiratory : Clear to auscultation bilaterally, no wheezing or crackles auscultated  6. Cardiovascular : S1-S2, regular, no murmur auscultated  7. Gastrointestinal:  Soft, nontender, no organomegaly     Data Review:    CBC Recent Labs  Lab 12/17/18 1252 12/17/18 1257  WBC 6.7  --   HGB 14.7 16.7*  HCT 48.3* 49.0*  PLT 261  --   MCV 84.4  --   MCH 25.7*  --   MCHC 30.4  --   RDW 15.9*  --   LYMPHSABS 2.2  --   MONOABS 0.3  --   EOSABS 0.0  --   BASOSABS 0.0  --    ------------------------------------------------------------------------------------------------------------------  Results for orders placed or performed during the hospital encounter of 12/17/18 (from the past 48 hour(s))  CBG monitoring, ED     Status: Abnormal   Collection Time: 12/17/18 12:51 PM  Result Value Ref Range   Glucose-Capillary 164 (H) 70 - 99 mg/dL  Protime-INR     Status: None   Collection Time: 12/17/18 12:52 PM  Result Value Ref Range   Prothrombin Time 13.2 11.4 - 15.2 seconds   INR 1.0 0.8 - 1.2    Comment: (NOTE) INR goal varies based on device and disease states. Performed at [facility], [place] [address], [place], [place] [address]   APTT     Status: None   Collection Time: 12/17/18 12:52 PM  Result Value Ref Range   aPTT 28 24 - 36 seconds    Comment: Performed at [facility] [address] [place],  [address]  CBC     Status: Abnormal   Collection Time: 12/17/18 12:52 PM  Result Value  Ref Range   WBC 6.7 4.0 - 10.5  K/uL   RBC 5.72 (H) 3.87 - 5.11 MIL/uL   Hemoglobin 14.7 12.0 - 15.0 g/dL   HCT 48.3 (H) 36.0 - 46.0 %   MCV 84.4 80.0 - 100.0 fL   MCH 25.7 (L) 26.0 - 34.0 pg   MCHC 30.4 30.0 - 36.0 g/dL   RDW 15.9 (H) 11.5 - 15.5 %   Platelets 261 150 - 400 K/uL   nRBC 0.0 0.0 - 0.2 %    Comment: Performed at [facility] [address] [place], [place] [address]  Differential     Status: None   Collection Time: 12/17/18 12:52 PM  Result Value Ref Range   Neutrophils Relative % 63 %   Neutro Abs 4.1 1.7 - 7.7 K/uL   Lymphocytes Relative 33 %   Lymphs Abs 2.2 0.7 - 4.0 K/uL   Monocytes Relative 4 %   Monocytes Absolute 0.3 0.1 - 1.0 K/uL   Eosinophils Relative 0 %   Eosinophils Absolute 0.0 0.0 - 0.5 K/uL   Basophils Relative 0 %   Basophils Absolute 0.0 0.0 - 0.1 K/uL   Immature Granulocytes 0 %   Abs Immature Granulocytes 0.02 0.00 - 0.07 K/uL    Comment: Performed at [facility] [address], [place], [place] [address]  Comprehensive metabolic panel     Status: Abnormal   Collection Time: 12/17/18 12:52 PM  Result Value Ref Range   Sodium 140 135 - 145 mmol/L   Potassium 3.5 3.5 - 5.1 mmol/L   Chloride 104 98 - 111 mmol/L   CO2 25 22 - 32 mmol/L   Glucose, Bld 173 (H) 70 - 99 mg/dL   BUN 15 8 - 23 mg/dL   Creatinine, Ser 1.12 (H) 0.44 - 1.00 mg/dL   Calcium 9.0 8.9 - 10.3 mg/dL   Total Protein 6.7 6.5 - 8.1 g/dL   Albumin 3.4 (L) 3.5 - 5.0 g/dL   AST 21 15 - 41 U/L   ALT 14 0 - 44 U/L   Alkaline Phosphatase 65 38 - 126 U/L   Total Bilirubin 1.3 (H) 0.3 - 1.2 mg/dL   GFR calc non Af Amer 46 (L) >60 mL/min   GFR calc Af Amer 53 (L) >60 mL/min   Anion gap 11 5 - 15    Comment: Performed at [facility] [address], [place], [place] [address]  I-stat chem 8, ED     Status: Abnormal   Collection Time: 12/17/18 12:57 PM  Result Value Ref Range   Sodium 141 135 - 145 mmol/L   Potassium 3.5 3.5 - 5.1 mmol/L   Chloride 102 98  - 111 mmol/L   BUN 17 8 - 23 mg/dL   Creatinine, Ser 1.00 0.44 - 1.00 mg/dL   Glucose, Bld 163 (H) 70 - 99 mg/dL   Calcium, Ion 1.13 (L) 1.15 - 1.40 mmol/L   TCO2 25 22 - 32 mmol/L   Hemoglobin 16.7 (H) 12.0 - 15.0 g/dL   HCT 49.0 (H) 36.0 - 46.0 %    Chemistries  Recent Labs  Lab 12/17/18 1252 12/17/18 1257  NA 140 141  K 3.5 3.5  CL 104 102  CO2 25  --   GLUCOSE 173* 163*  BUN 15 17  CREATININE 1.12* 1.00  CALCIUM 9.0  --   AST 21  --   ALT 14  --   ALKPHOS 65  --   BILITOT 1.3*  --    ------------------------------------------------------------------------------------------------------------------  ------------------------------------------------------------------------------------------------------------------  GFR: Estimated Creatinine Clearance: 46.9 mL/min (by C-G formula based on SCr of 1 mg/dL). Liver Function Tests: Recent Labs  Lab 12/17/18 1252  AST 21  ALT 14  ALKPHOS 65  BILITOT 1.3*  PROT 6.7  ALBUMIN 3.4*   No results for input(s): LIPASE, AMYLASE in the last 168 hours. No results for input(s): AMMONIA in the last 168 hours. Coagulation Profile: Recent Labs  Lab 12/17/18 1252  INR 1.0   CBG: Recent Labs  Lab 12/17/18 1251  GLUCAP 164*    --------------------------------------------------------------------------------------------------------------- Urine analysis:    Component Value Date/Time   COLORURINE YELLOW 08/27/2018 1618   APPEARANCEUR CLEAR 08/27/2018 1618   LABSPEC 1.011 08/27/2018 1618   PHURINE 7.0 08/27/2018 1618   GLUCOSEU NEGATIVE 08/27/2018 1618   HGBUR NEGATIVE 08/27/2018 1618   BILIRUBINUR NEGATIVE 08/27/2018 1618   BILIRUBINUR n 01/03/2016 1048   KETONESUR NEGATIVE 08/27/2018 1618   PROTEINUR NEGATIVE 08/27/2018 1618   UROBILINOGEN 2.0 (H) 08/24/2018 1936   NITRITE NEGATIVE 08/27/2018 1618   LEUKOCYTESUR TRACE (A) 08/27/2018 1618      Imaging Results:    Ct Head Code Stroke Wo Contrast  Result Date:  12/17/2018 CLINICAL DATA:  Code stroke.  Focal neuro deficit.  Slurred speech. EXAM: CT HEAD WITHOUT CONTRAST TECHNIQUE: Contiguous axial images were obtained from the base of the skull through the vertex without intravenous contrast. COMPARISON:  CT head 08/27/2018 FINDINGS: Brain: Ventricular enlargement and generalized atrophy stable from the prior study. Cerebral white matter disease bilaterally appears chronic and stable. Hypodensity in the medulla likely is artifactual and is different than on the prior study. Negative for acute cortical infarct. Negative for hemorrhage or mass. No midline shift. Vascular: Negative for hyperdense vessel Skull: Negative skull.  Large torus palatini. Sinuses/Orbits: Mild mucosal edema paranasal sinuses. Bilateral cataract surgery. Other: None ASPECTS ([place] Stroke Program Early CT Score) - Ganglionic level infarction (caudate, lentiform nuclei, internal capsule, insula, M1-M3 cortex): 7 - Supraganglionic infarction (M4-M6 cortex): 3 Total score (0-10 with 10 being normal): 10 IMPRESSION: 1. No acute abnormality 2. ASPECTS is 10 3. Atrophy and chronic ischemic changes throughout the white matter. No change from the prior study. 4. Findings texted to Dr. Cheral Marker Electronically Signed   By: Franchot Gallo M.D.   On: 12/17/2018 13:16    My personal review of EKG: Rhythm NSR, LBBB, T wave inversions in leads II, III and aVF.   Assessment & Plan:    Active Problems:   AMS (altered mental status)  1. Altered mental status-patient does have history of dementia, at this time she is in altered mental status.  Concern for aspiration pneumonia as patient having repeated vomiting.  Will obtain stat chest x-ray, start IV Zosyn per pharmacy consultation.  EEG and UA ordered.  Neurochecks every 2 hours.  Also check procalcitonin.  2. Intractable vomiting-unclear etiology, will obtain CT abdomen pelvis to rule out gastroenteritis versus SBO versus ileus.  3. Dementia-stable,  will hold home medications as currently patient is vomiting.  4. Chronic diastolic CHF-stable, euvolemic.  5. Abnormal EKG-patient has T wave inversions in leads II, III and aVF.  Will obtain serial troponin.    DVT Prophylaxis-   Lovenox   AM Labs Ordered, also please review Full Orders  Family Communication: No family at bedside  Code Status: Full code  Admission status: Inpatient: Based on patients clinical presentation and evaluation of above clinical data, I have made determination that patient meets Inpatient criteria at this time.  Time spent in  minutes : 60 minutes   Oswald Hillock M.D on 12/17/2018 at 2:32 PM

## 2018-12-17 NOTE — ED Notes (Signed)
Pt had large bowel movement and urinated in brief, cleaned by this Rn and [employer]. Pure wick replaced. Urinalysis not collected because purewick was stopped up with feces. Bed changed and pt clean now. Pt now more responsive and following commands. Denies nausea at this time.

## 2018-12-17 NOTE — ED Notes (Signed)
Please call daughter [name] @ [phone] a status update--Megon Kalina

## 2018-12-17 NOTE — ED Notes (Signed)
Pt has had 2 episodes of vomiting in the last 10 minutes with brownish vomit. Mouth suctioned, head of bed elevated, airway intact. Pt still lethargic but withdrawals from painful or obnoxious stimuli. Moving all extremities.

## 2018-12-17 NOTE — ED Notes (Signed)
Pt now resting, no longer agitated.

## 2018-12-17 NOTE — ED Notes (Signed)
This RN spoke with daughter and she said that the pt went with her to church today and the pt just suddenly stop responding but her eyes were opened after she laid down onto a table. The pt has these episodes occasionally, it does not happen every day. She states that the pt will become unresponsive and then still follow you with her eyes. When the pt comes back to then she is her normal self but sometimes she will throw up and some times she doesn't.

## 2018-12-17 NOTE — Consult Note (Addendum)
NEURO HOSPITALIST  CONSULT   Requesting Physician: Dr. Johnney Killian    Chief Complaint: Slurred speech  History obtained from:  Chart  HPI:                                                                                                                                         Nicole Blackwell is an 83 y.o. female with PMH significant for HTN presented to [facility] ED as a code stroke for slurred speech. Patient was with family at a park. She began having a vomiting episode then stared off and became agitated. Per EMS and family this is usual behavior for her after one of her vomiting episodes. She usually only vomits once, but today she has done this twice. In the ambulance patient's speech became incoherent and EMS activated code stroke. Patient is incontinent at baseline. Patient has had EEG's in the past that have not shown epileptiform activity.  ED course:  CTH: no hemorrhage CBG: 240  BP: 142/70 2 mg ativan given in CT for agitation  Last EEG 05/21/2018:  This is an abnormal EEG secondary to mild posterior background slowing.  This finding may be seen with a diffuse gray matter disturbance that is etiologically nonspecific, but may include a dementia, among other possibilities.  No epileptiform activity is noted.    Date last known well: 12/17/2018 Time last known well: 1213 tPA Given: no; non focal exam NIHSS: 1; impaired level of consciousness   Past Medical History:  Diagnosis Date  . BACK PAIN   . Hypertension   . OSTEOPOROSIS   . PLANTAR FASCIITIS   . Positive PPD   . SHINGLES   . SYMPTOMATIC MENOPAUSAL/FEMALE CLIMACTERIC STATES   . Syncope and collapse 12/13/2014  . Uterine cancer [facility])     Past Surgical History:  Procedure Laterality Date  . CATARACT EXTRACTION, BILATERAL Bilateral   . DILATION AND CURETTAGE OF UTERUS  1960's X 2   "I lost 2 children to miscarriage"  . LAPAROSCOPIC CHOLECYSTECTOMY    . SIGMOIDOSCOPY    .  TONSILLECTOMY    . VAGINAL HYSTERECTOMY      Family History  Problem Relation Age of Onset  . Healthy Mother   . Healthy Father        Social History:  reports that she has never smoked. She has never used smokeless tobacco. She reports that she does not drink alcohol or use drugs.  Allergies:  Allergies  Allergen Reactions  . Penicillin G Benzathine Swelling    Did it involve swelling of the face/tongue/throat, SOB, or low BP? Unknown Did it involve sudden or severe rash/hives,  skin peeling, or any reaction on the inside of your mouth or nose? Unknown Did you need to seek medical attention at a hospital or doctor's office? Unknown When did it last happen?unknown If all above answers are "NO", may proceed with cephalosporin use.  Marland Kitchen Penicillins Swelling    Did it involve swelling of the face/tongue/throat, SOB, or low BP? Unknown Did it involve sudden or severe rash/hives, skin peeling, or any reaction on the inside of your mouth or nose? Unknown Did you need to seek medical attention at a hospital or doctor's office? Unknown When did it last happen?unknown If all above answers are "NO", may proceed with cephalosporin use.    Medications:                                                                                                                           Current Facility-Administered Medications  Medication Dose Route Frequency Provider Last Rate Last Dose  . LORazepam (ATIVAN) 2 MG/ML injection           . LORazepam (ATIVAN) injection 2 mg  2 mg Intravenous Once Kerney Elbe, MD      . sodium chloride flush (NS) 0.9 % injection 3 mL  3 mL Intravenous Once Charlesetta Shanks, MD       Current Outpatient Medications  Medication Sig Dispense Refill  . acetaminophen (TYLENOL) 325 MG tablet Take 2 tablets (650 mg total) by mouth every 6 (six) hours as needed for mild pain or headache (or Fever >/= 101).    Marland Kitchen aspirin EC 81 MG tablet Take 81 mg by mouth at bedtime.    Marland Kitchen  losartan (COZAAR) 25 MG tablet TAKE 1 TABLET(25 MG) BY MOUTH DAILY 30 tablet 0  . Melatonin 3 MG TABS Take 1 tablet (3 mg total) by mouth at bedtime. 30 tablet 0  . vitamin B-12 (CYANOCOBALAMIN) 100 MCG tablet Take 100 mcg by mouth daily.       ROS:                                                                                                                                        unobtainable from patient due to mental status and lack of cooperation    General Examination:  Height 5' 9.5" (1.765 m), weight 69 kg.  HEENT-  Normocephalic, no lesions, without obvious abnormality.  Normal external eye and conjunctiva. Cardiovascular- pulses palpable throughout  Lungs-, no excessive working breathing.  Saturations within normal limits Extremities- Warm, dry and intact Musculoskeletal-no joint tenderness, deformity or swelling Skin-warm and dry, no hyperpigmentation, vitiligo, or suspicious lesions  Neurological Examination Mental Status: Decreases level of alertness, with eyes closed and perseverative exclamations during most of the exam. Vigorously chewing gum in a manner that appears somewhat stereotyped. Not following any commands but briskly reacts to tactile stimuli, perseverating with " y'all are hurting me."  No dysarthria noted. After patient received ativan she was sedated/somnolent and no longer reacting to stimuli.   Cranial Nerves: II: Blinks to threat bilaterally, patient actively resists eye opening. PERRL.  No gaze preference. Eyes able to cross midline. Face appears symmetric Motor/ sensory: Able to move all 4 extremities with good strength in response to noxious. Able to break gravity. No asymmetry noted.  Deep Tendon Reflexes: 2+ and symmetric patellae Plantars: Right: downgoing   Left: downgoing Cerebellar: No ataxia noted with spontaneous movements, but unable to formally  assess Gait: deferred   Lab Results: Basic Metabolic Panel: Recent Labs  Lab 12/17/18 1257  NA 141  K 3.5  CL 102  GLUCOSE 163*  BUN 17  CREATININE 1.00    CBC: Recent Labs  Lab 12/17/18 1257  HGB 16.7*  HCT 49.0*    CBG: Recent Labs  Lab 12/17/18 1251  GLUCAP 164*    Imaging: Ct Head Code Stroke Wo Contrast  Result Date: 12/17/2018 CLINICAL DATA:  Code stroke.  Focal neuro deficit.  Slurred speech. EXAM: CT HEAD WITHOUT CONTRAST TECHNIQUE: Contiguous axial images were obtained from the base of the skull through the vertex without intravenous contrast. COMPARISON:  CT head 08/27/2018 FINDINGS: Brain: Ventricular enlargement and generalized atrophy stable from the prior study. Cerebral white matter disease bilaterally appears chronic and stable. Hypodensity in the medulla likely is artifactual and is different than on the prior study. Negative for acute cortical infarct. Negative for hemorrhage or mass. No midline shift. Vascular: Negative for hyperdense vessel Skull: Negative skull.  Large torus palatini. Sinuses/Orbits: Mild mucosal edema paranasal sinuses. Bilateral cataract surgery. Other: None ASPECTS ([place] Stroke Program Early CT Score) - Ganglionic level infarction (caudate, lentiform nuclei, internal capsule, insula, M1-M3 cortex): 7 - Supraganglionic infarction (M4-M6 cortex): 3 Total score (0-10 with 10 being normal): 10 IMPRESSION: 1. No acute abnormality 2. ASPECTS is 10 3. Atrophy and chronic ischemic changes throughout the white matter. No change from the prior study. 4. Findings texted to Dr. Cheral Marker Electronically Signed   By: Franchot Gallo M.D.   On: 12/17/2018 13:16    Laurey Morale, MSN, NP-C [facility] [phone] 12/17/2018, 1:02 PM    Assessment: 83 y.o. female with PMHx significant for HTN who presented to the [facility] ED as a code stroke for slurred speech.  1. IV tPA not given due to nonlateralizing exam and overall findings more  consistent with AMS or atypical seizure than a stroke. 2. CTH showed no hemorrhage.  3. DDX: Atypical seizure vs toxic/metabolic or infectious encephalopathy 4. Stroke Risk Factors - hypertension   Recommendations: - Frequent neuro checks - EEG - UA - Toxic/metabolic work up - Seizure precautions --Please page stroke NP  Or  PA  Or MD from 8am -4 pm  as this patient from this time will be  followed by the stroke.   You can  look them up on [email or web address]  Password [email or web address]   I have seen and evaluated the patient. I have formulated the assessment and recommendations. My exam findings were observed and documented by Laurey Morale, NP.  Electronically signed: Dr. Kerney Elbe

## 2018-12-18 ENCOUNTER — Inpatient Hospital Stay (HOSPITAL_COMMUNITY): Payer: Medicare (Managed Care)

## 2018-12-18 DIAGNOSIS — R4182 Altered mental status, unspecified: Secondary | ICD-10-CM

## 2018-12-18 LAB — CBC
HCT: 49.3 % — ABNORMAL HIGH (ref 36.0–46.0)
Hemoglobin: 15.3 g/dL — ABNORMAL HIGH (ref 12.0–15.0)
MCH: 25.8 pg — ABNORMAL LOW (ref 26.0–34.0)
MCHC: 31 g/dL (ref 30.0–36.0)
MCV: 83 fL (ref 80.0–100.0)
Platelets: 213 10*3/uL (ref 150–400)
RBC: 5.94 MIL/uL — ABNORMAL HIGH (ref 3.87–5.11)
RDW: 16 % — ABNORMAL HIGH (ref 11.5–15.5)
WBC: 10.5 10*3/uL (ref 4.0–10.5)
nRBC: 0 % (ref 0.0–0.2)

## 2018-12-18 LAB — TROPONIN I (HIGH SENSITIVITY): Troponin I (High Sensitivity): 5 ng/L (ref <18)

## 2018-12-18 LAB — URINALYSIS, MICROSCOPIC (REFLEX)

## 2018-12-18 LAB — URINALYSIS, ROUTINE W REFLEX MICROSCOPIC
Bilirubin Urine: NEGATIVE
Glucose, UA: NEGATIVE mg/dL
Ketones, ur: NEGATIVE mg/dL
Nitrite: NEGATIVE
Protein, ur: NEGATIVE mg/dL
Specific Gravity, Urine: 1.015 (ref 1.005–1.030)
pH: 6.5 (ref 5.0–8.0)

## 2018-12-18 IMAGING — CT CT ABD-PELV W/ CM
2 of 5 series · 16 of 46 positions shown, 18 images · IV contrast (APPLIED)
Comparison: None.

CLINICAL DATA: 83-year-old female with abdominal pain and vomiting.
Concern for gastroenteritis versus colitis.

EXAM:
CT ABDOMEN AND PELVIS WITH CONTRAST
TECHNIQUE: Multidetector CT imaging of the abdomen and pelvis was performed
using the standard protocol following bolus administration of
intravenous contrast.
CONTRAST:  100mL OMNIPAQUE IOHEXOL 300 MG/ML  SOLN

[Series 3: abdomen 5.0 · axial · 0.88mm/px · z∈[+769,+1164]mm · 13 of 93 slices shown, 15 images]
[im 7/93  soft-tissue]
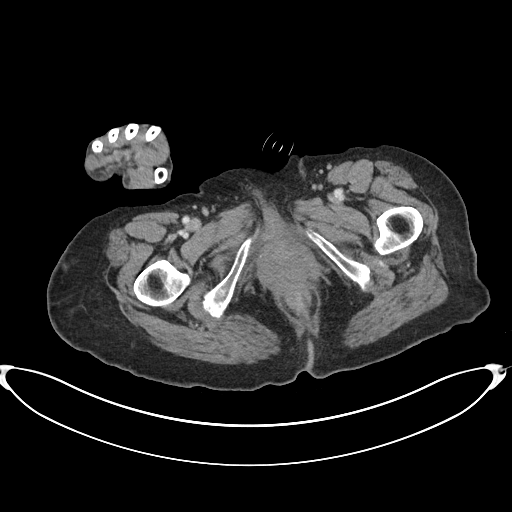
[im 7/93  bone]
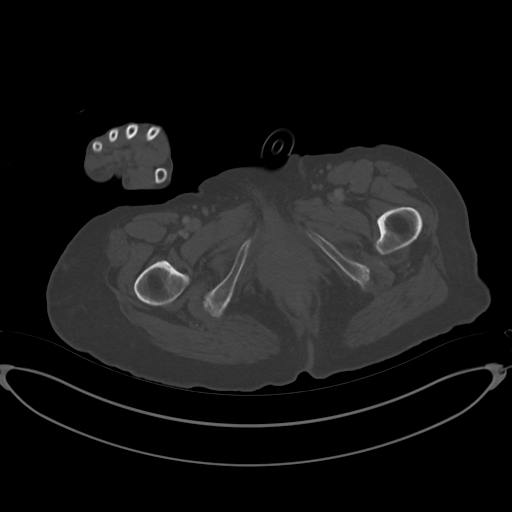
[im 14/93  soft-tissue]
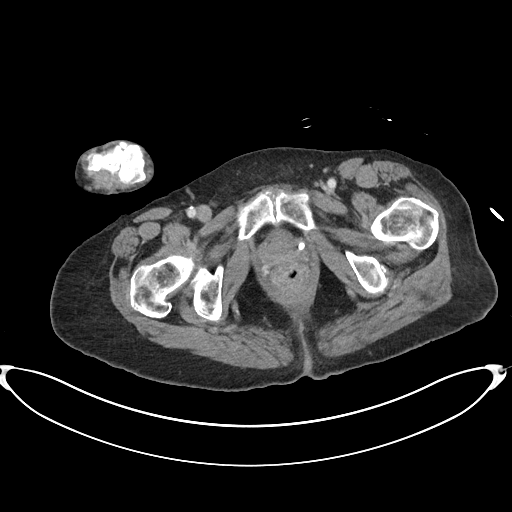
[im 20/93  soft-tissue]
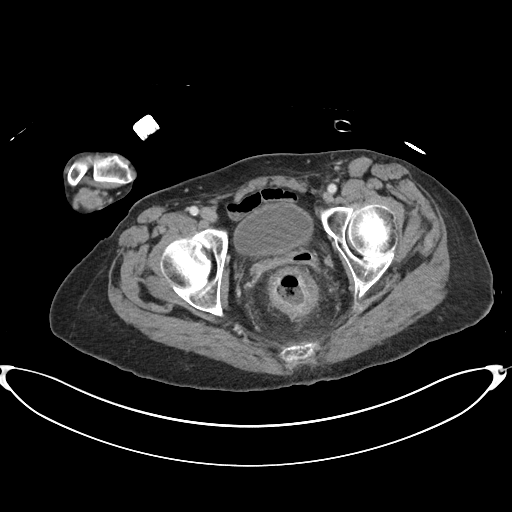
[im 27/93  soft-tissue]
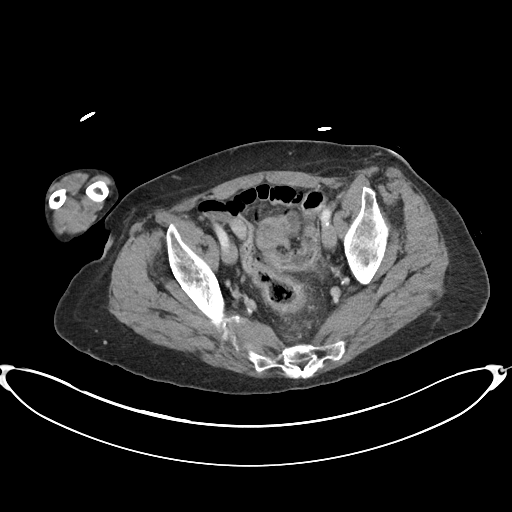
[im 33/93  soft-tissue]
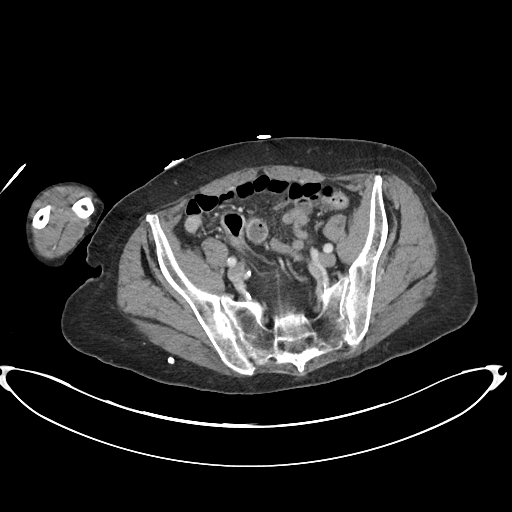
[im 40/93  soft-tissue]
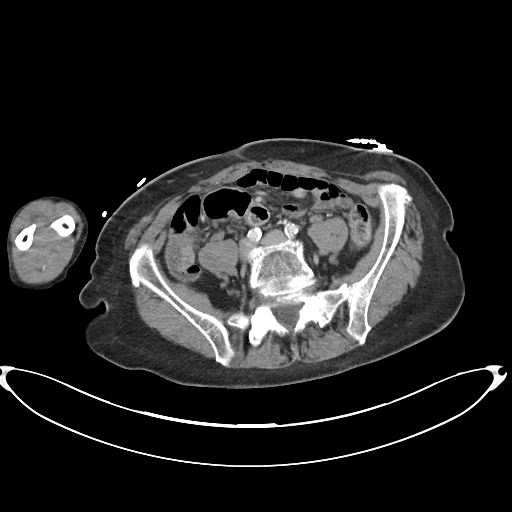
[im 47/93  soft-tissue]
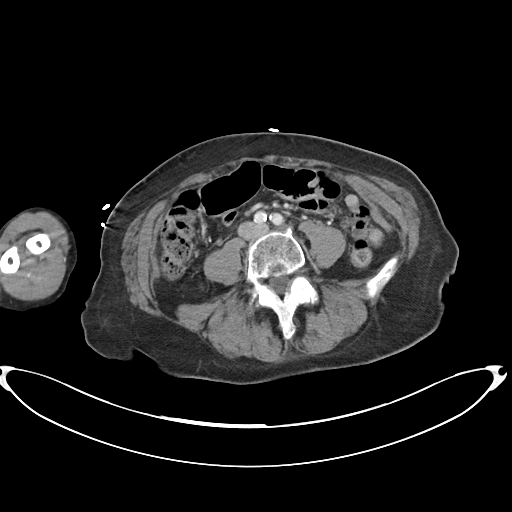
[im 53/93  soft-tissue]
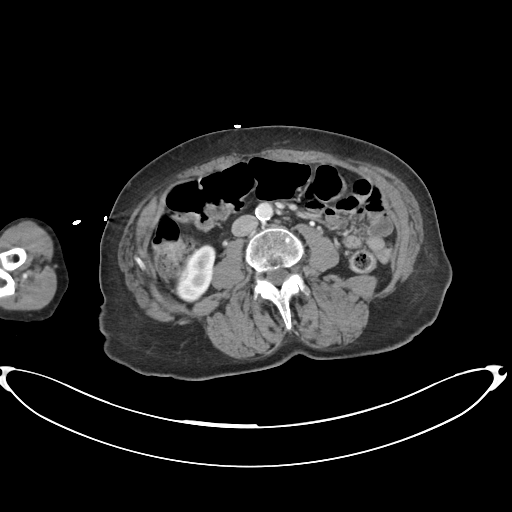
[im 60/93  soft-tissue]
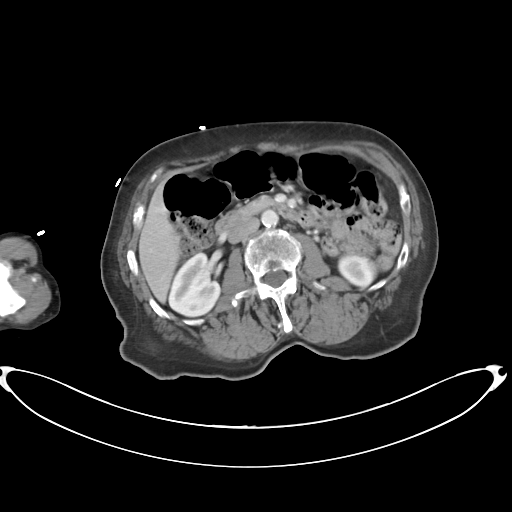
[im 60/93  bone]
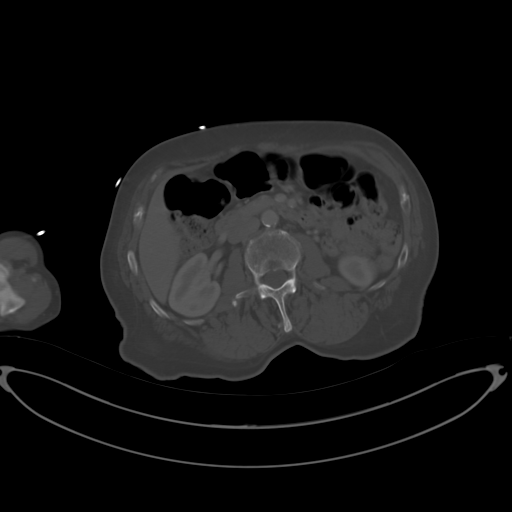
[im 66/93  soft-tissue]
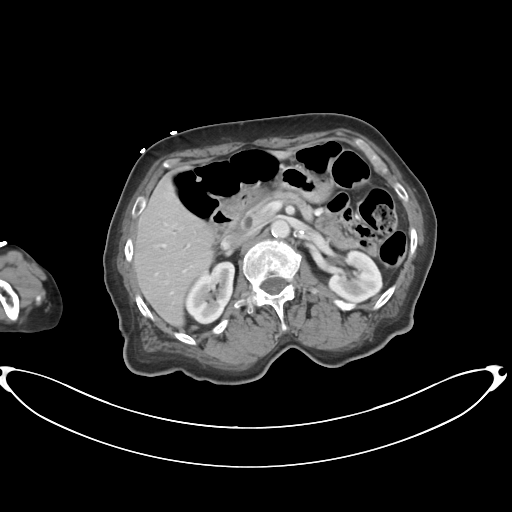
[im 73/93  soft-tissue]
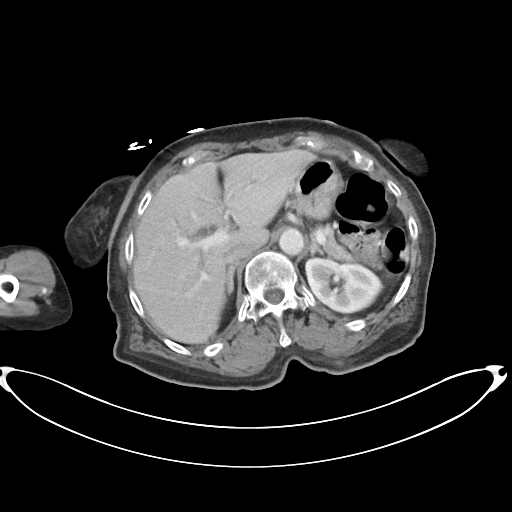
[im 79/93  soft-tissue]
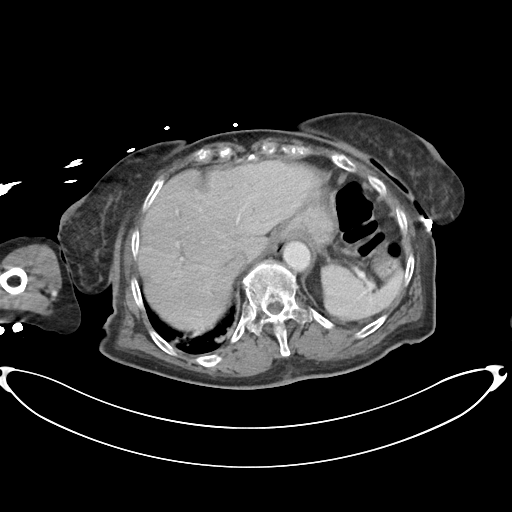
[im 86/93  soft-tissue]
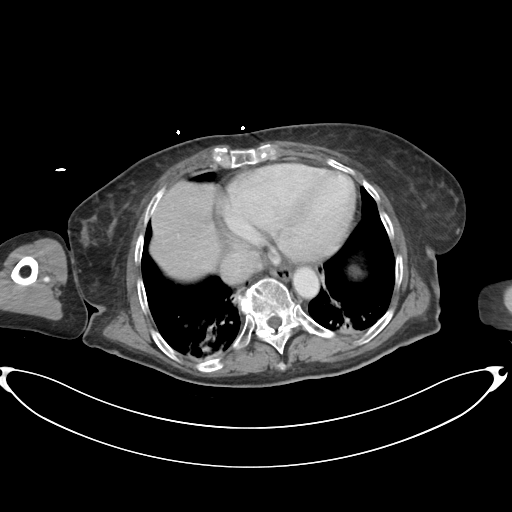

[Series 6: abdomen 3.0 mpr cor · coronal · 0.89mm/px · 3 of 82 slices shown]
[im 28/82  soft-tissue]
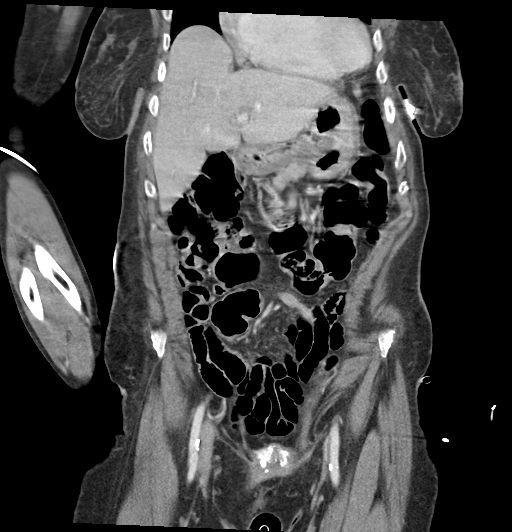
[im 37/82  soft-tissue]
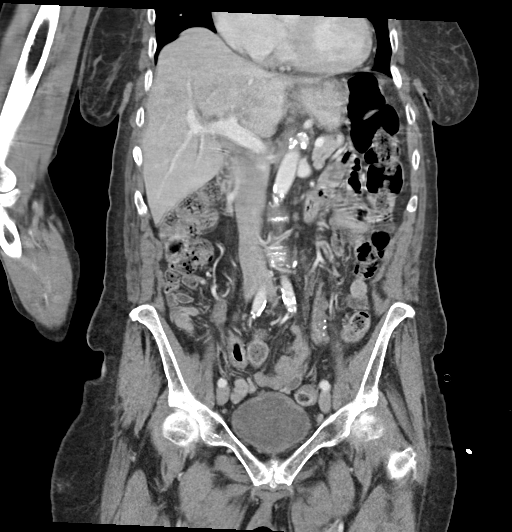
[im 46/82  soft-tissue]
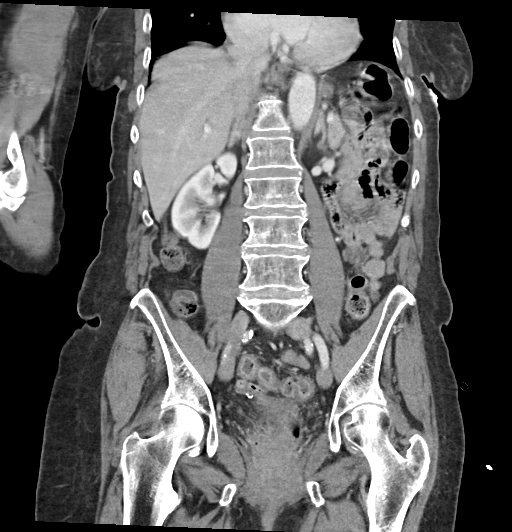

[16 of 46 positions shown; findings below may reference images not displayed]

FINDINGS: Evaluation of this exam is limited due to respiratory motion
artifact.

Lower chest: Bibasilar streaky densities may represent
atelectasis/scarring or developing infiltrate. Clinical correlation
is recommended. There is mild cardiomegaly.

No intra-abdominal free air or free fluid.

Hepatobiliary: The liver is unremarkable. There is mild intrahepatic
biliary ductal dilatation. Cholecystectomy. No retained calcified
stone noted in the central CBD.

Pancreas: Unremarkable. No pancreatic ductal dilatation or
surrounding inflammatory changes.

Spleen: Normal in size without focal abnormality.

Adrenals/Urinary Tract: The adrenal glands are unremarkable. The
kidneys, visualized ureters, and urinary bladder appear
unremarkable.

Stomach/Bowel: There is a small hiatal hernia. There is no bowel
obstruction. There is circumferential thickening of the rectosigmoid
most consistent with proctocolitis. Clinical correlation is
recommended. The appendix is normal.

Vascular/Lymphatic: Moderate aortoiliac atherosclerotic disease. The
IVC is unremarkable. No portal venous gas. There is no adenopathy.

Reproductive: Hysterectomy. No adnexal masses.

Other: Diffuse stranding and edema of the pelvic floor fat. No fluid
collection.

Musculoskeletal: Osteopenia with degenerative changes of the spine.
No acute osseous pathology.
IMPRESSION: Findings most consistent with proctocolitis. Clinical correlation is
recommended. No bowel obstruction. Normal appendix.

Aortic Atherosclerosis ([8P]-[8P]).

## 2018-12-18 MED ORDER — ENOXAPARIN SODIUM 40 MG/0.4ML ~~LOC~~ SOLN
40.0000 mg | Freq: Every day | SUBCUTANEOUS | Status: DC
  Administered 2018-12-18 – 2018-12-19 (×2): 40 mg via SUBCUTANEOUS
  Filled 2018-12-18 (×2): qty 0.4

## 2018-12-18 MED ORDER — HYDRALAZINE HCL 10 MG PO TABS
10.0000 mg | ORAL_TABLET | Freq: Four times a day (QID) | ORAL | Status: DC | PRN
  Administered 2018-12-18: 10 mg via ORAL
  Filled 2018-12-18: qty 1

## 2018-12-18 MED ORDER — ONDANSETRON HCL 4 MG PO TABS: 4.0000 mg | ORAL_TABLET | Freq: Four times a day (QID) | ORAL | Status: DC | PRN

## 2018-12-18 MED ORDER — ACETAMINOPHEN 325 MG PO TABS: 650.0000 mg | ORAL_TABLET | Freq: Four times a day (QID) | ORAL | Status: DC | PRN

## 2018-12-18 MED ORDER — LOSARTAN POTASSIUM 25 MG PO TABS
25.0000 mg | ORAL_TABLET | Freq: Every day | ORAL | Status: DC
  Administered 2018-12-18 – 2018-12-19 (×2): 25 mg via ORAL
  Filled 2018-12-18 (×2): qty 1

## 2018-12-18 MED ORDER — ACETAMINOPHEN 650 MG RE SUPP: 650.0000 mg | Freq: Four times a day (QID) | RECTAL | Status: DC | PRN

## 2018-12-18 MED ORDER — IOHEXOL 300 MG/ML  SOLN
100.0000 mL | Freq: Once | INTRAMUSCULAR | Status: AC | PRN
  Administered 2018-12-18: 100 mL via INTRAVENOUS

## 2018-12-18 MED ORDER — ONDANSETRON HCL 4 MG/2ML IJ SOLN: 4.0000 mg | Freq: Four times a day (QID) | INTRAMUSCULAR | Status: DC | PRN

## 2018-12-18 MED ORDER — LACTATED RINGERS IV SOLN
INTRAVENOUS | Status: DC
  Administered 2018-12-18: 04:00:00 via INTRAVENOUS

## 2018-12-18 NOTE — Progress Notes (Signed)
Nevin Bloodgood nurse  from [facility] called.  Nicole Blackwell is a patient of theirs.  Was calling to get an update.  Gave update to RN

## 2018-12-18 NOTE — Progress Notes (Addendum)
Patient fell after climbing out of bed.   Bed alarm was on bed in lowest position and floor mats were in place.   I ran to room once bed alarm went off but patient was on the floor.   Per patient hit her lower back and elbow.   Noted a abrasion along the lower right side of back and small area on right elbow.     Vital signs obtained and MD aware - will continue to monitor; sitter ordered.  We got patient back in bed and bed alarm on and mats in place again.   Will continue to monitor patient for any changes.    Prior to fall- Nurse and Nurse tech had been in  Room 3 other times re-orientating patent and getting her back in bed.    Daughter called-  Is aware she fell.

## 2018-12-18 NOTE — Evaluation (Signed)
Clinical/Bedside Swallow Evaluation Patient Details  Name: Nicole Blackwell MRN: [id number] Date of Birth: [birth year]  Today's Date: 12/18/2018 Time: SLP Start Time (ACUTE ONLY): 48 SLP Stop Time (ACUTE ONLY): 1537 SLP Time Calculation (min) (ACUTE ONLY): 7 min  Past Medical History:  Past Medical History:  Diagnosis Date  . BACK PAIN   . Hypertension   . OSTEOPOROSIS   . PLANTAR FASCIITIS   . Positive PPD   . SHINGLES   . SYMPTOMATIC MENOPAUSAL/FEMALE CLIMACTERIC STATES   . Syncope and collapse 12/13/2014  . Uterine cancer [facility])    Past Surgical History:  Past Surgical History:  Procedure Laterality Date  . CATARACT EXTRACTION, BILATERAL Bilateral   . DILATION AND CURETTAGE OF UTERUS  1960's X 2   "I lost 2 children to miscarriage"  . LAPAROSCOPIC CHOLECYSTECTOMY    . SIGMOIDOSCOPY    . TONSILLECTOMY    . VAGINAL HYSTERECTOMY     HPI:  Nicole Blackwell is an 83 year old female with past medical history significant for dementia, chronic diastolic heart failure, hypertension who presented to the emergency department after vomiting episode followed by agitation.  Per family members, patient has 1 of these episodes every day.  There was a question of slurred speech, code stroke was called.  Neurology evaluated patient and code stroke was canceled.  Neurology recommended further evaluation with EEG and urinalysis.   Assessment / Plan / Recommendation Clinical Impression  Pt presents with functional swallowing as assessed clinically.  Pt tolerated all consistencies trialed with no clinical s/s of aspiration.  Pt exhibited good oral clearance of solids.  Chest CT concerning for possible infection; noted episodes of vomiting in chart, which may be source of any possible aspiration.   Recommend regular texture diet with thin liquid.  SLP Visit Diagnosis: Dysphagia, unspecified (R13.10)    Aspiration Risk  No limitations    Diet Recommendation Regular;Thin liquid   Liquid  Administration via: Cup;Straw Medication Administration: Whole meds with liquid Supervision: Patient able to self feed;Staff to assist with self feeding Compensations: Slow rate;Small sips/bites Postural Changes: Seated upright at 90 degrees    Other  Recommendations Oral Care Recommendations: Oral care BID   Follow up Recommendations  Likely none     Frequency and Duration min 1 x/week  2 weeks       Prognosis Prognosis for Safe Diet Advancement: Good Barriers to Reach Goals: Cognitive deficits      Swallow Study   General Date of Onset: 12/17/18 HPI: Nicole Blackwell is an 83 year old female with past medical history significant for dementia, chronic diastolic heart failure, hypertension who presented to the emergency department after vomiting episode followed by agitation.  Per family members, patient has 1 of these episodes every day.  There was a question of slurred speech, code stroke was called.  Neurology evaluated patient and code stroke was canceled.  Neurology recommended further evaluation with EEG and urinalysis. Type of Study: Bedside Swallow Evaluation Previous Swallow Assessment: None Diet Prior to this Study: NPO Temperature Spikes Noted: No Respiratory Status: Room air Behavior/Cognition: Cooperative;Alert;Pleasant mood Oral Cavity Assessment: Within Functional Limits Oral Care Completed by SLP: No Oral Cavity - Dentition: Missing dentition Self-Feeding Abilities: Able to feed self;Needs assist Patient Positioning: Upright in bed Baseline Vocal Quality: Normal Volitional Cough: Strong Volitional Swallow: Able to elicit    Oral/Motor/Sensory Function Overall Oral Motor/Sensory Function: Mild impairment Facial ROM: Within Functional Limits Facial Symmetry: Within Functional Limits Lingual ROM: Within Functional Limits Lingual Strength: Reduced Mandible:  Within Functional Limits   Ice Chips Ice chips: Not tested   Thin Liquid Thin Liquid: Within functional  limits Presentation: Cup;Straw    Nectar Thick Nectar Thick Liquid: Not tested   Honey Thick Honey Thick Liquid: Not tested   Puree Puree: Within functional limits Presentation: Spoon   Solid     Solid: Within functional limits Presentation: [place], [place], [place] Office: [phone]; Pager (10/25): [phone] 12/18/2018,4:24 PM

## 2018-12-18 NOTE — Progress Notes (Signed)
Patient had a BP of 189/85 .  Paged MD to get orders for BP medications.     MD ordered Hydralazine and cozaar

## 2018-12-18 NOTE — Progress Notes (Signed)
EEG complete - results pending 

## 2018-12-18 NOTE — Progress Notes (Signed)
Patient arrived to 3e06 from [facility]. Alert to self only. VSS. Patient drowsy. Pt has no skin breakdown. Unable to obtain history do to patients condition.

## 2018-12-18 NOTE — Progress Notes (Signed)
NEURO HOSPITALIST PROGRESS NOTE   Subjective: Patient awake, more alert than yesterday. Oriented to self only. NAD.  Exam: Vitals:   12/18/18 0608 12/18/18 0800  BP: (!) 167/73 (!) 154/66  Pulse: 67 66  Resp: 14 16  Temp: 98.6 F (37 C) 98.6 F (37 C)  SpO2: 100% 100%    Physical Exam  HEENT-  Normocephalic, no lesions, without obvious abnormality.  Normal external eye and conjunctiva.   Cardiovascular-  pulses palpable throughout   Lungs-, no excessive work of breathing.  Saturations within normal limits on RA Extremities- Warm, dry and intact Musculoskeletal-no joint tenderness, deformity or swelling Skin-warm and dry, no hyperpigmentation, vitiligo, or suspicious lesions   Neuro:  Mental Status: Alert, oriented to name only, thought content appropriate.  Speech fluent without evidence of aphasia.  Able to follow commands without difficulty though somewhat slowly. Cranial Nerves: [id number] fields grossly normal,  III,IV, VI: ptosis not present, extra-ocular motions intact bilaterally pupils equal, round, reactive to light and accommodation V,VII: smile symmetric, facial light touch sensation normal bilaterally VIII: hearing normal bilaterally IX,X: uvula rises symmetrically XI: bilateral shoulder shrug XII: midline tongue extension Motor: Right : Upper extremity   5/5  Left:     Upper extremity   5/5  Lower extremity   5/5   Lower extremity   5/5 Tone and bulk:normal tone throughout; no atrophy noted Sensory: Light touch intact throughout, bilaterally Plantars: Right: downgoing   Left: downgoing Gait: deferred    Medications:  Scheduled: . enoxaparin (LOVENOX) injection  40 mg Subcutaneous Daily   Continuous: . ceFEPime (MAXIPIME) IV 2 g (12/18/18 0344)  . lactated ringers 75 mL/hr at 12/18/18 0345  . metronidazole 500 mg (12/18/18 0748)   [id number] **OR** acetaminophen, ondansetron **OR** ondansetron (ZOFRAN) IV  Pertinent  Labs/Diagnostics: UA: pending BG: 163    Ct Abdomen Pelvis W Contrast  Result Date: 12/18/2018 CLINICAL DATA:  83 year old female with abdominal pain and vomiting. Concern for gastroenteritis versus colitis. EXAM: CT ABDOMEN AND PELVIS WITH CONTRAST TECHNIQUE: Multidetector CT imaging of the abdomen and pelvis was performed using the standard protocol following bolus administration of intravenous contrast. CONTRAST:  114mL OMNIPAQUE IOHEXOL 300 MG/ML  SOLN COMPARISON:  None. FINDINGS: Evaluation of this exam is limited due to respiratory motion artifact. Lower chest: Bibasilar streaky densities may represent atelectasis/scarring or developing infiltrate. Clinical correlation is recommended. There is mild cardiomegaly. No intra-abdominal free air or free fluid. Hepatobiliary: The liver is unremarkable. There is mild intrahepatic biliary ductal dilatation. Cholecystectomy. No retained calcified stone noted in the central CBD. Pancreas: Unremarkable. No pancreatic ductal dilatation or surrounding inflammatory changes. Spleen: Normal in size without focal abnormality. Adrenals/Urinary Tract: The adrenal glands are unremarkable. The kidneys, visualized ureters, and urinary bladder appear unremarkable. Stomach/Bowel: There is a small hiatal hernia. There is no bowel obstruction. There is circumferential thickening of the rectosigmoid most consistent with proctocolitis. Clinical correlation is recommended. The appendix is normal. Vascular/Lymphatic: Moderate aortoiliac atherosclerotic disease. The IVC is unremarkable. No portal venous gas. There is no adenopathy. Reproductive: Hysterectomy. No adnexal masses. Other: Diffuse stranding and edema of the pelvic floor fat. No fluid collection. Musculoskeletal: Osteopenia with degenerative changes of the spine. No acute osseous pathology. IMPRESSION: Findings most consistent with proctocolitis. Clinical correlation is recommended. No bowel obstruction. Normal appendix.  Aortic Atherosclerosis (ICD10-I70.0). Electronically Signed   By: Laren Everts.D.  On: 12/18/2018 02:34   Dg Chest Port 1v Same Day  Result Date: 12/17/2018 CLINICAL DATA:  Altered mental status EXAM: PORTABLE CHEST 1 VIEW COMPARISON:  08/27/2018 FINDINGS: Cardiomegaly. Both lungs are clear. The visualized skeletal structures are unremarkable. IMPRESSION: Cardiomegaly without acute abnormality of the lungs in AP portable projection. Electronically Signed   By: Eddie Candle M.D.   On: 12/17/2018 16:05   Ct Head Code Stroke Wo Contrast  Result Date: 12/17/2018 CLINICAL DATA:  Code stroke.  Focal neuro deficit.  Slurred speech. EXAM: CT HEAD WITHOUT CONTRAST TECHNIQUE: Contiguous axial images were obtained from the base of the skull through the vertex without intravenous contrast. COMPARISON:  CT head 08/27/2018 FINDINGS: Brain: Ventricular enlargement and generalized atrophy stable from the prior study. Cerebral white matter disease bilaterally appears chronic and stable. Hypodensity in the medulla likely is artifactual and is different than on the prior study. Negative for acute cortical infarct. Negative for hemorrhage or mass. No midline shift. Vascular: Negative for hyperdense vessel Skull: Negative skull.  Large torus palatini. Sinuses/Orbits: Mild mucosal edema paranasal sinuses. Bilateral cataract surgery. Other: None ASPECTS ([place] Stroke Program Early CT Score) - Ganglionic level infarction (caudate, lentiform nuclei, internal capsule, insula, M1-M3 cortex): 7 - Supraganglionic infarction (M4-M6 cortex): 3 Total score (0-10 with 10 being normal): 10 IMPRESSION: 1. No acute abnormality 2. ASPECTS is 10 3. Atrophy and chronic ischemic changes throughout the white matter. No change from the prior study. 4. Findings texted to Dr. Cheral Marker Electronically Signed   By: Franchot Gallo M.D.   On: 12/17/2018 13:16   Assessment: 83 y.o. female with PMHx significant for HTN who presented to the [facility] ED  with acute onset of confusion, agitation, slurred speech and verbal perseveration.  1. Initial ED exam nonlateralizing, with overall findings more consistent with AMS or atypical seizure than a stroke. 2. CTH showed no hemorrhage.  3. DDX: Atypical seizure vs toxic/metabolic or infectious encephalopathy 4. Stroke Risk Factors - hypertension   Recommendations: - Frequent neuro checks - EEG today - Seizure precautions  Laurey Morale, MSN, NP-C [facility] [phone]   Electronically signed: Dr. Kerney Elbe 12/18/2018, 9:57 AM

## 2018-12-18 NOTE — Progress Notes (Addendum)
PROGRESS NOTE    Nicole Blackwell  [id number] DOB: 12/08/1935 DOA: 12/17/2018 PCP: Billie Ruddy, MD     Brief Narrative:  Nicole Blackwell is an 83 year old female with past medical history significant for dementia, chronic diastolic heart failure, hypertension who presented to the emergency department after vomiting episode followed by agitation.  Per family members, patient has 1 of these episodes every day.  There was a question of slurred speech, code stroke was called.  Neurology evaluated patient and code stroke was canceled.  Neurology recommended further evaluation with EEG and urinalysis.  New events last 24 hours / Subjective: Patient awake, alert but not oriented.  She was pleasantly confused without any complaints on examination.  Denied any vomiting this morning.  After my initial evaluation this morning, I was called by the RN, patient had an unwitnessed fall without head injury.  Sitter has been ordered at bedside.  Assessment & Plan:   Principal Problem:   AMS (altered mental status) Active Problems:   Diastolic CHF ([place])   Vascular dementia without behavioral disturbance (HCC)   Dementia (HCC)   Acute metabolic encephalopathy with underlying dementia -CT head without acute abnormality -EEG to rule out atypical seizure and UA recommended by Neurology   Concern for aspiration pneumonia -Patient has had repeated vomiting -Chest x-ray showed cardiomegaly without acute abnormality of the lungs -CT abdomen pelvis revealed bibasilar streaky densities in the lower chest, representing atelectasis versus scarring versus developing infiltrate -Patient allergic to penicillin.  Currently on cefepime, Flagyl -SLP evaluation -IVF   Intractable nausea and vomiting -CT abdomen pelvis revealed proctocolitis without bowel obstruction -Seems to be resolved at this point, continue to monitor  Chronic diastolic heart failure -Stable  Abnormal EKG -Patient has T wave  inversions in leads II, III, aVF.  Troponin negative   DVT prophylaxis: Lovenox  Code Status: Full code Family Communication: Spoke with daughter for update  Disposition Plan: Pending further work-up and symptom management   Consultants:   Neurology  Procedures:   None   Antimicrobials:  Anti-infectives (From admission, onward)   Start     Dose/Rate Route Frequency Ordered Stop   12/17/18 1500  ceFEPIme (MAXIPIME) 2 g in sodium chloride 0.9 % 100 mL IVPB     2 g 200 mL/hr over 30 Minutes Intravenous Every 12 hours 12/17/18 1453     12/17/18 1500  metroNIDAZOLE (FLAGYL) IVPB 500 mg     500 mg 100 mL/hr over 60 Minutes Intravenous Every 8 hours 12/17/18 1453          Objective: Vitals:   12/18/18 0221 12/18/18 0608 12/18/18 0800 12/18/18 1138  BP: (!) 163/74 (!) 167/73 (!) 154/66 (!) 175/70  Pulse: 69 67 66 67  Resp: 16 14 16 19   Temp: 99 F (37.2 C) 98.6 F (37 C) 98.6 F (37 C) 98.2 F (36.8 C)  TempSrc: Oral Oral Oral Oral  SpO2: 100% 100% 100% 100%  Weight:      Height:        Intake/Output Summary (Last 24 hours) at 12/18/2018 1223 Last data filed at 12/18/2018 0748 Gross per 24 hour  Intake 704.79 ml  Output 250 ml  Net 454.79 ml   Filed Weights   12/17/18 1200  Weight: 69 kg    Examination:  General exam: Appears calm and comfortable  Respiratory system: Clear to auscultation. Respiratory effort normal. No respiratory distress. No conversational dyspnea.  Cardiovascular system: S1 & S2 heard, RRR. No murmurs. No pedal  edema. Gastrointestinal system: Abdomen is nondistended, soft and nontender. Normal bowel sounds heard. Central nervous system: Alert but not oriented  Extremities: Symmetric in appearance  Skin: No rashes, lesions or ulcers on exposed skin  Psychiatry: Dementia   Data Reviewed: I have personally reviewed following labs and imaging studies  CBC: Recent Labs  Lab 12/17/18 1252 12/17/18 1257 12/18/18 0724  WBC 6.7  --   10.5  NEUTROABS 4.1  --   --   HGB 14.7 16.7* 15.3*  HCT 48.3* 49.0* 49.3*  MCV 84.4  --  83.0  PLT 261  --  123456   Basic Metabolic Panel: Recent Labs  Lab 12/17/18 1252 12/17/18 1257  NA 140 141  K 3.5 3.5  CL 104 102  CO2 25  --   GLUCOSE 173* 163*  BUN 15 17  CREATININE 1.12* 1.00  CALCIUM 9.0  --    GFR: Estimated Creatinine Clearance: 46.9 mL/min (by C-G formula based on SCr of 1 mg/dL). Liver Function Tests: Recent Labs  Lab 12/17/18 1252  AST 21  ALT 14  ALKPHOS 65  BILITOT 1.3*  PROT 6.7  ALBUMIN 3.4*   No results for input(s): LIPASE, AMYLASE in the last 168 hours. No results for input(s): AMMONIA in the last 168 hours. Coagulation Profile: Recent Labs  Lab 12/17/18 1252  INR 1.0   Cardiac Enzymes: No results for input(s): CKTOTAL, CKMB, CKMBINDEX, TROPONINI in the last 168 hours. BNP (last 3 results) No results for input(s): PROBNP in the last 8760 hours. HbA1C: No results for input(s): HGBA1C in the last 72 hours. CBG: Recent Labs  Lab 12/17/18 1251  GLUCAP 164*   Lipid Profile: No results for input(s): CHOL, HDL, LDLCALC, TRIG, CHOLHDL, LDLDIRECT in the last 72 hours. Thyroid Function Tests: No results for input(s): TSH, T4TOTAL, FREET4, T3FREE, THYROIDAB in the last 72 hours. Anemia Panel: No results for input(s): VITAMINB12, FOLATE, FERRITIN, TIBC, IRON, RETICCTPCT in the last 72 hours. Sepsis Labs: No results for input(s): PROCALCITON, LATICACIDVEN in the last 168 hours.  Recent Results (from the past 240 hour(s))  SARS CORONAVIRUS 2 (TAT 6-24 HRS) Nasopharyngeal Nasopharyngeal Swab     Status: None   Collection Time: 12/17/18  2:24 PM   Specimen: Nasopharyngeal Swab  Result Value Ref Range Status   SARS Coronavirus 2 NEGATIVE NEGATIVE Final    Comment: (NOTE) SARS-CoV-2 target nucleic acids are NOT DETECTED. The SARS-CoV-2 RNA is generally detectable in upper and lower respiratory specimens during the acute phase of infection.  Negative results do not preclude SARS-CoV-2 infection, do not rule out co-infections with other pathogens, and should not be used as the sole basis for treatment or other patient management decisions. Negative results must be combined with clinical observations, patient history, and epidemiological information. The expected result is Negative. Fact Sheet for Patients: SugarRoll.be Fact Sheet for Healthcare Providers: https://www.woods-mathews.com/ This test is not yet approved or cleared by the [place] FDA and  has been authorized for detection and/or diagnosis of SARS-CoV-2 by FDA under an Emergency Use Authorization (EUA). This EUA will remain  in effect (meaning this test can be used) for the duration of the COVID-19 declaration under Section 56 4(b)(1) of the Act, 21 U.S.C. section 360bbb-3(b)(1), unless the authorization is terminated or revoked sooner. Performed at [facility], [place] [address] [place], [place] [address]       Radiology Studies: Ct Abdomen Pelvis W Contrast  Result Date: 12/18/2018 CLINICAL DATA:  83 year old female with abdominal pain  and vomiting. Concern for gastroenteritis versus colitis. EXAM: CT ABDOMEN AND PELVIS WITH CONTRAST TECHNIQUE: Multidetector CT imaging of the abdomen and pelvis was performed using the standard protocol following bolus administration of intravenous contrast. CONTRAST:  139mL OMNIPAQUE IOHEXOL 300 MG/ML  SOLN COMPARISON:  None. FINDINGS: Evaluation of this exam is limited due to respiratory motion artifact. Lower chest: Bibasilar streaky densities may represent atelectasis/scarring or developing infiltrate. Clinical correlation is recommended. There is mild cardiomegaly. No intra-abdominal free air or free fluid. Hepatobiliary: The liver is unremarkable. There is mild intrahepatic biliary ductal dilatation. Cholecystectomy. No retained calcified stone noted in the central CBD.  Pancreas: Unremarkable. No pancreatic ductal dilatation or surrounding inflammatory changes. Spleen: Normal in size without focal abnormality. Adrenals/Urinary Tract: The adrenal glands are unremarkable. The kidneys, visualized ureters, and urinary bladder appear unremarkable. Stomach/Bowel: There is a small hiatal hernia. There is no bowel obstruction. There is circumferential thickening of the rectosigmoid most consistent with proctocolitis. Clinical correlation is recommended. The appendix is normal. Vascular/Lymphatic: Moderate aortoiliac atherosclerotic disease. The IVC is unremarkable. No portal venous gas. There is no adenopathy. Reproductive: Hysterectomy. No adnexal masses. Other: Diffuse stranding and edema of the pelvic floor fat. No fluid collection. Musculoskeletal: Osteopenia with degenerative changes of the spine. No acute osseous pathology. IMPRESSION: Findings most consistent with proctocolitis. Clinical correlation is recommended. No bowel obstruction. Normal appendix. Aortic Atherosclerosis (ICD10-I70.0). Electronically Signed   By: Anner Crete M.D.   On: 12/18/2018 02:34   Dg Chest Port 1v Same Day  Result Date: 12/17/2018 CLINICAL DATA:  Altered mental status EXAM: PORTABLE CHEST 1 VIEW COMPARISON:  08/27/2018 FINDINGS: Cardiomegaly. Both lungs are clear. The visualized skeletal structures are unremarkable. IMPRESSION: Cardiomegaly without acute abnormality of the lungs in AP portable projection. Electronically Signed   By: Eddie Candle M.D.   On: 12/17/2018 16:05   Ct Head Code Stroke Wo Contrast  Result Date: 12/17/2018 CLINICAL DATA:  Code stroke.  Focal neuro deficit.  Slurred speech. EXAM: CT HEAD WITHOUT CONTRAST TECHNIQUE: Contiguous axial images were obtained from the base of the skull through the vertex without intravenous contrast. COMPARISON:  CT head 08/27/2018 FINDINGS: Brain: Ventricular enlargement and generalized atrophy stable from the prior study. Cerebral white  matter disease bilaterally appears chronic and stable. Hypodensity in the medulla likely is artifactual and is different than on the prior study. Negative for acute cortical infarct. Negative for hemorrhage or mass. No midline shift. Vascular: Negative for hyperdense vessel Skull: Negative skull.  Large torus palatini. Sinuses/Orbits: Mild mucosal edema paranasal sinuses. Bilateral cataract surgery. Other: None ASPECTS ([place] Stroke Program Early CT Score) - Ganglionic level infarction (caudate, lentiform nuclei, internal capsule, insula, M1-M3 cortex): 7 - Supraganglionic infarction (M4-M6 cortex): 3 Total score (0-10 with 10 being normal): 10 IMPRESSION: 1. No acute abnormality 2. ASPECTS is 10 3. Atrophy and chronic ischemic changes throughout the white matter. No change from the prior study. 4. Findings texted to Dr. Cheral Marker Electronically Signed   By: Franchot Gallo M.D.   On: 12/17/2018 13:16      Scheduled Meds:  enoxaparin (LOVENOX) injection  40 mg Subcutaneous Daily   Continuous Infusions:  ceFEPime (MAXIPIME) IV 2 g (12/18/18 1058)   lactated ringers 75 mL/hr at 12/18/18 0345   metronidazole 500 mg (12/18/18 0748)     LOS: 1 day      Time spent: 40 minutes   Dessa Phi, DO [facility] 12/18/2018, 12:23 PM   Available via Epic secure chat 7am-7pm After these hours,  please refer to coverage provider listed on [email or web address]

## 2018-12-19 LAB — BASIC METABOLIC PANEL
Anion gap: 10 (ref 5–15)
BUN: 10 mg/dL (ref 8–23)
CO2: 25 mmol/L (ref 22–32)
Calcium: 8.5 mg/dL — ABNORMAL LOW (ref 8.9–10.3)
Chloride: 105 mmol/L (ref 98–111)
Creatinine, Ser: 0.92 mg/dL (ref 0.44–1.00)
GFR calc Af Amer: >60 mL/min (ref >60)
GFR calc non Af Amer: 58 mL/min — ABNORMAL LOW (ref >60)
Glucose, Bld: 82 mg/dL (ref 70–99)
Potassium: 3.4 mmol/L — ABNORMAL LOW (ref 3.5–5.1)
Sodium: 140 mmol/L (ref 135–145)

## 2018-12-19 LAB — CBC
HCT: 44.4 % (ref 36.0–46.0)
Hemoglobin: 13.9 g/dL (ref 12.0–15.0)
MCH: 25.6 pg — ABNORMAL LOW (ref 26.0–34.0)
MCHC: 31.3 g/dL (ref 30.0–36.0)
MCV: 81.9 fL (ref 80.0–100.0)
Platelets: 220 10*3/uL (ref 150–400)
RBC: 5.42 MIL/uL — ABNORMAL HIGH (ref 3.87–5.11)
RDW: 15.5 % (ref 11.5–15.5)
WBC: 6.4 10*3/uL (ref 4.0–10.5)
nRBC: 0 % (ref 0.0–0.2)

## 2018-12-19 MED ORDER — CEFDINIR 300 MG PO CAPS: 300.0000 mg | ORAL_CAPSULE | Freq: Two times a day (BID) | ORAL | 0 refills | Status: AC

## 2018-12-19 MED ORDER — POTASSIUM CHLORIDE CRYS ER 20 MEQ PO TBCR
40.0000 meq | EXTENDED_RELEASE_TABLET | Freq: Once | ORAL | Status: AC
  Administered 2018-12-19: 40 meq via ORAL
  Filled 2018-12-19: qty 2

## 2018-12-19 MED ORDER — METRONIDAZOLE 500 MG PO TABS: 500.0000 mg | ORAL_TABLET | Freq: Three times a day (TID) | ORAL | 0 refills | Status: AC

## 2018-12-19 NOTE — Progress Notes (Signed)
Nicole Blackwell to be D/C'd Home per MD order.  Discussed with the patient and all questions fully answered.  VSS, Skin clean, dry and intact without evidence of skin break down, no evidence of skin tears noted. IV catheter discontinued intact. Site without signs and symptoms of complications. Dressing and pressure applied.  An After Visit Summary was printed and given to the patient and informed [facility] driver of AVS summary with patient belongings.   D/c education completed with patient/family including follow up instructions, medication list, d/c activities limitations if indicated, with other d/c instructions as indicated by MD - patient able to verbalize understanding, all questions fully answered.   Patient instructed to return to ED, call 911, or call MD for any changes in condition.   Patient escorted via [facility] by [facility] driver and D/C home via private auto.  Howard Pouch 12/19/2018 7:58 PM

## 2018-12-19 NOTE — Procedures (Signed)
Patient Name: Nicole Blackwell  MRN: [id number]  Epilepsy Attending: Lora Havens  Referring Physician/Provider: Dr. Eleonore Chiquito Date: 12/18/2023 Duration: 27.33 minutes  Patient history: 83 year old female who presented with slurred speech.  EEG to evaluate for seizures.  Level of alertness: Awake/confused  AEDs during EEG study: ativan  Technical aspects: This EEG study was done with scalp electrodes positioned according to the 10-20 International system of electrode placement. Electrical activity was acquired at a sampling rate of 500Hz  and reviewed with a high frequency filter of 70Hz  and a low frequency filter of 1Hz . EEG data were recorded continuously and digitally stored.   Description: EEG showed continuous generalized polymorphic 2 to 5 Hz theta-delta slowing admixed with excessive amount of 15 to 18 Hz generalized beta activity.  No clear posterior dominant rhythm was seen.  Physiologic driving was not seen during photic stimulation.  Hyperventilation was not performed.  Abnormality -Continuous slow, generalized -Excessive fast, generalized  IMPRESSION: This study is suggestive of moderate diffuse encephalopathy, nonspecific to etiology. No seizures or epileptiform discharges were seen throughout the recording.  If concern for seizures persist, prolonged study including sleep or long term monitoring can be considered.  Skip Litke Barbra Sarks

## 2018-12-19 NOTE — Progress Notes (Signed)
Called patient's (ex) husband Kiaja Denver at 1232 to inform patient to be transported by [facility] and arrive at 1315. Yoselyn Vanriper ex-husband will meet patient/PACE at daughters home to receive patient.   Informed DO Maylene Roes of daughter [name]'s call to follow-up after discharge. RN spoke with [facility] Alysa about daughters PT questions/concerns; informed [facility] team will meet and follow up with daughter [name] and provided after hours number to contact nurse or physician and Lysbeth Galas stated daughter previously received after hours contact information. This RN returned call to daughter and informed of after hours number [phone] as provided by [facility] Lysbeth Galas to contact nurse or doctor. Informed daughter to call emergency services/911 if there is an emergency, but can call [facility] after hours number for other concerns or questions.

## 2018-12-19 NOTE — TOC Transition Note (Signed)
Transition of Care [facility]) - CM/SW Discharge Note   Patient Details  Name: Nicole Blackwell MRN: [id number] Date of Birth: 08/23/1935  [facility] of [facility]) CM/SW Contact:  Zenon Mayo, RN Phone Number: 12/19/2018, 1:17 PM   Clinical Narrative:    Patient for dc today, she is a member of [facility],  NCM spoke with Alysa her CSW at [facility], she state they will transport patient home.  Staff RN states she will be ready at 1:15, NCM informed Alysa with [facility], and also that she will need HHPT.  Alysa states she will have their physical therapist eval patient to see what her needs are.    Final next level of care: Home/Self Care Barriers to Discharge: No Barriers Identified   Patient Goals and CMS Choice Patient states their goals for this hospitalization and ongoing recovery are:: go home   Choice offered to / list presented to : NA  Discharge Placement                       Discharge Plan and Services                DME Arranged: (NA)         HH Arranged: NA          Social Determinants of Health (SDOH) Interventions     Readmission Risk Interventions No flowsheet data found.

## 2018-12-19 NOTE — Discharge Summary (Signed)
Physician Discharge Summary  Nicole Blackwell DOB: 09-May-1935 DOA: 12/17/2018  PCP: Billie Ruddy, MD  Admit date: 12/17/2018 Discharge date: 12/19/2018  Admitted From: Home Disposition:  Home   Recommendations for Outpatient Follow-up:  1. Follow up with PCP in 1 week  Discharge Condition: Stable CODE STATUS: Full  Diet recommendation: Regular  Brief/Interim Summary: Nicole Blackwell is an 83 year old female with past medical history significant for dementia, chronic diastolic heart failure, hypertension who presented to the emergency department after vomiting episode followed by agitation.  Per family members, patient has 1 of these episodes every day.  There was a question of slurred speech, code stroke was called.  Neurology evaluated patient and code stroke was canceled.  Neurology recommended further evaluation with EEG and urinalysis. EEG showed encephalopathy of nonspecific etiology, negative for seizure. UA negative for UTI. Patient did have an unwitnessed fall during hospitalization without injury.   Discharge Diagnoses:  Principal Problem:   AMS (altered mental status) Active Problems:   Diastolic CHF ([place])   Vascular dementia without behavioral disturbance (HCC)   Dementia (HCC)   Acute metabolic encephalopathy with underlying dementia -CT head without acute abnormality -EEG negative for seizure -UA negative for UTI   Concern for aspiration pneumonia -Patient has had repeated vomiting -Chest x-ray showed cardiomegaly without acute abnormality of the lungs -CT abdomen pelvis revealed bibasilar streaky densities in the lower chest, representing atelectasis versus scarring versus developing infiltrate -Patient allergic to penicillin.  Currently on cefepime, Flagyl --> de-escalate to Omnicef/Flagyl on discharge -SLP evaluation recommended regular diet  Intractable nausea and vomiting -CT abdomen pelvis revealed proctocolitis without bowel  obstruction -Seems to be resolved at this point, continue to monitor  Chronic diastolic heart failure -Stable  Abnormal EKG -Patient has T wave inversions in leads II, III, aVF.  Troponin negative  Hypokalemia -Replace, trend    Discharge Instructions  Discharge Instructions    Call MD for:  difficulty breathing, headache or visual disturbances   Complete by: As directed    Call MD for:  extreme fatigue   Complete by: As directed    Call MD for:  persistant dizziness or light-headedness   Complete by: As directed    Call MD for:  persistant nausea and vomiting   Complete by: As directed    Call MD for:  severe uncontrolled pain   Complete by: As directed    Call MD for:  temperature >100.4   Complete by: As directed    Diet general   Complete by: As directed    Discharge instructions   Complete by: As directed    You were cared for by a hospitalist during your hospital stay. If you have any questions about your discharge medications or the care you received while you were in the hospital after you are discharged, you can call the unit and ask to speak with the hospitalist on call if the hospitalist that took care of you is not available. Once you are discharged, your primary care physician will handle any further medical issues. Please note that NO REFILLS for any discharge medications will be authorized once you are discharged, as it is imperative that you return to your primary care physician (or establish a relationship with a primary care physician if you do not have one) for your aftercare needs so that they can reassess your need for medications and monitor your lab values.   Increase activity slowly   Complete by: As directed  Allergies as of 12/19/2018      Reactions   Penicillin G Benzathine Swelling   Did it involve swelling of the face/tongue/throat, SOB, or low BP? Unknown Did it involve sudden or severe rash/hives, skin peeling, or any reaction on the  inside of your mouth or nose? Unknown Did you need to seek medical attention at a hospital or doctor's office? Unknown When did it last happen?unknown If all above answers are "NO", may proceed with cephalosporin use.   Penicillins Swelling   Did it involve swelling of the face/tongue/throat, SOB, or low BP? Unknown Did it involve sudden or severe rash/hives, skin peeling, or any reaction on the inside of your mouth or nose? Unknown Did you need to seek medical attention at a hospital or doctor's office? Unknown When did it last happen?unknown If all above answers are "NO", may proceed with cephalosporin use.      Medication List    STOP taking these medications   Melatonin 3 MG Tabs     TAKE these medications   acetaminophen 325 MG tablet Commonly known as: TYLENOL Take 2 tablets (650 mg total) by mouth every 6 (six) hours as needed for mild pain or headache (or Fever >/= 101).   aspirin EC 81 MG tablet Take 81 mg by mouth at bedtime.   cefdinir 300 MG capsule Commonly known as: OMNICEF Take 1 capsule (300 mg total) by mouth 2 (two) times daily for 5 days.   Cholecalciferol 1.25 MG (50000 UT) capsule Take 50,000 Units by mouth every Monday.   losartan 25 MG tablet Commonly known as: COZAAR TAKE 1 TABLET(25 MG) BY MOUTH DAILY What changed: See the new instructions.   metroNIDAZOLE 500 MG tablet Commonly known as: Flagyl Take 1 tablet (500 mg total) by mouth 3 (three) times daily for 5 days.   vitamin B-12 100 MCG tablet Commonly known as: CYANOCOBALAMIN Take 100 mcg by mouth daily.      Follow-up Information    Billie Ruddy, MD.   Specialty: Family Medicine Contact information: [address] [place] [address] [phone]          Allergies  Allergen Reactions  . Penicillin G Benzathine Swelling    Did it involve swelling of the face/tongue/throat, SOB, or low BP? Unknown Did it involve sudden or severe rash/hives, skin  peeling, or any reaction on the inside of your mouth or nose? Unknown Did you need to seek medical attention at a hospital or doctor's office? Unknown When did it last happen?unknown If all above answers are "NO", may proceed with cephalosporin use.  Marland Kitchen Penicillins Swelling    Did it involve swelling of the face/tongue/throat, SOB, or low BP? Unknown Did it involve sudden or severe rash/hives, skin peeling, or any reaction on the inside of your mouth or nose? Unknown Did you need to seek medical attention at a hospital or doctor's office? Unknown When did it last happen?unknown If all above answers are "NO", may proceed with cephalosporin use.    Consultations:  Neurology    Procedures/Studies: Ct Abdomen Pelvis W Contrast  Result Date: 12/18/2018 CLINICAL DATA:  83 year old female with abdominal pain and vomiting. Concern for gastroenteritis versus colitis. EXAM: CT ABDOMEN AND PELVIS WITH CONTRAST TECHNIQUE: Multidetector CT imaging of the abdomen and pelvis was performed using the standard protocol following bolus administration of intravenous contrast. CONTRAST:  156mL OMNIPAQUE IOHEXOL 300 MG/ML  SOLN COMPARISON:  None. FINDINGS: Evaluation of this exam is limited due to respiratory motion  artifact. Lower chest: Bibasilar streaky densities may represent atelectasis/scarring or developing infiltrate. Clinical correlation is recommended. There is mild cardiomegaly. No intra-abdominal free air or free fluid. Hepatobiliary: The liver is unremarkable. There is mild intrahepatic biliary ductal dilatation. Cholecystectomy. No retained calcified stone noted in the central CBD. Pancreas: Unremarkable. No pancreatic ductal dilatation or surrounding inflammatory changes. Spleen: Normal in size without focal abnormality. Adrenals/Urinary Tract: The adrenal glands are unremarkable. The kidneys, visualized ureters, and urinary bladder appear unremarkable. Stomach/Bowel: There is a small hiatal  hernia. There is no bowel obstruction. There is circumferential thickening of the rectosigmoid most consistent with proctocolitis. Clinical correlation is recommended. The appendix is normal. Vascular/Lymphatic: Moderate aortoiliac atherosclerotic disease. The IVC is unremarkable. No portal venous gas. There is no adenopathy. Reproductive: Hysterectomy. No adnexal masses. Other: Diffuse stranding and edema of the pelvic floor fat. No fluid collection. Musculoskeletal: Osteopenia with degenerative changes of the spine. No acute osseous pathology. IMPRESSION: Findings most consistent with proctocolitis. Clinical correlation is recommended. No bowel obstruction. Normal appendix. Aortic Atherosclerosis (ICD10-I70.0). Electronically Signed   By: Anner Crete M.D.   On: 12/18/2018 02:34   Dg Chest Port 1v Same Day  Result Date: 12/17/2018 CLINICAL DATA:  Altered mental status EXAM: PORTABLE CHEST 1 VIEW COMPARISON:  08/27/2018 FINDINGS: Cardiomegaly. Both lungs are clear. The visualized skeletal structures are unremarkable. IMPRESSION: Cardiomegaly without acute abnormality of the lungs in AP portable projection. Electronically Signed   By: Eddie Candle M.D.   On: 12/17/2018 16:05   Ct Head Code Stroke Wo Contrast  Result Date: 12/17/2018 CLINICAL DATA:  Code stroke.  Focal neuro deficit.  Slurred speech. EXAM: CT HEAD WITHOUT CONTRAST TECHNIQUE: Contiguous axial images were obtained from the base of the skull through the vertex without intravenous contrast. COMPARISON:  CT head 08/27/2018 FINDINGS: Brain: Ventricular enlargement and generalized atrophy stable from the prior study. Cerebral white matter disease bilaterally appears chronic and stable. Hypodensity in the medulla likely is artifactual and is different than on the prior study. Negative for acute cortical infarct. Negative for hemorrhage or mass. No midline shift. Vascular: Negative for hyperdense vessel Skull: Negative skull.  Large torus  palatini. Sinuses/Orbits: Mild mucosal edema paranasal sinuses. Bilateral cataract surgery. Other: None ASPECTS ([place] Stroke Program Early CT Score) - Ganglionic level infarction (caudate, lentiform nuclei, internal capsule, insula, M1-M3 cortex): 7 - Supraganglionic infarction (M4-M6 cortex): 3 Total score (0-10 with 10 being normal): 10 IMPRESSION: 1. No acute abnormality 2. ASPECTS is 10 3. Atrophy and chronic ischemic changes throughout the white matter. No change from the prior study. 4. Findings texted to Dr. Cheral Marker Electronically Signed   By: Franchot Gallo M.D.   On: 12/17/2018 13:16    EEG 10/25  IMPRESSION: This study is suggestive of moderate diffuse encephalopathy, nonspecific to etiology. No seizures or epileptiform discharges were seen throughout the recording.   Discharge Exam: Vitals:   12/19/18 0510 12/19/18 0810  BP: (!) 162/78 132/75  Pulse: 66 (!) 58  Resp: 18   Temp: 97.7 F (36.5 C)   SpO2: 100% 100%     General: Pt is alert, awake, not in acute distress Cardiovascular: RRR, S1/S2 +, no edema Respiratory: CTA bilaterally, no wheezing, no rhonchi, no respiratory distress, no conversational dyspnea  Abdominal: Soft, NT, ND, bowel sounds + Extremities: no edema, no cyanosis Psych: Pleasantly demented, alert and oriented to self and place only    The results of significant diagnostics from this hospitalization (including imaging, microbiology, ancillary and laboratory) are listed  below for reference.     Microbiology: Recent Results (from the past 240 hour(s))  SARS CORONAVIRUS 2 (TAT 6-24 HRS) Nasopharyngeal Nasopharyngeal Swab     Status: None   Collection Time: 12/17/18  2:24 PM   Specimen: Nasopharyngeal Swab  Result Value Ref Range Status   SARS Coronavirus 2 NEGATIVE NEGATIVE Final    Comment: (NOTE) SARS-CoV-2 target nucleic acids are NOT DETECTED. The SARS-CoV-2 RNA is generally detectable in upper and lower respiratory specimens during the  acute phase of infection. Negative results do not preclude SARS-CoV-2 infection, do not rule out co-infections with other pathogens, and should not be used as the sole basis for treatment or other patient management decisions. Negative results must be combined with clinical observations, patient history, and epidemiological information. The expected result is Negative. Fact Sheet for Patients: SugarRoll.be Fact Sheet for Healthcare Providers: https://www.woods-mathews.com/ This test is not yet approved or cleared by the [place] FDA and  has been authorized for detection and/or diagnosis of SARS-CoV-2 by FDA under an Emergency Use Authorization (EUA). This EUA will remain  in effect (meaning this test can be used) for the duration of the COVID-19 declaration under Section 56 4(b)(1) of the Act, 21 U.S.C. section 360bbb-3(b)(1), unless the authorization is terminated or revoked sooner. Performed at [facility], [place] [address], [place],  [address]      Labs: BNP (last 3 results) No results for input(s): BNP in the last 8760 hours. Basic Metabolic Panel: Recent Labs  Lab 12/17/18 1252 12/17/18 1257 12/19/18 0535  NA 140 141 140  K 3.5 3.5 3.4*  CL 104 102 105  CO2 25  --  25  GLUCOSE 173* 163* 82  BUN 15 17 10   CREATININE 1.12* 1.00 0.92  CALCIUM 9.0  --  8.5*   Liver Function Tests: Recent Labs  Lab 12/17/18 1252  AST 21  ALT 14  ALKPHOS 65  BILITOT 1.3*  PROT 6.7  ALBUMIN 3.4*   No results for input(s): LIPASE, AMYLASE in the last 168 hours. No results for input(s): AMMONIA in the last 168 hours. CBC: Recent Labs  Lab 12/17/18 1252 12/17/18 1257 12/18/18 0724 12/19/18 0535  WBC 6.7  --  10.5 6.4  NEUTROABS 4.1  --   --   --   HGB 14.7 16.7* 15.3* 13.9  HCT 48.3* 49.0* 49.3* 44.4  MCV 84.4  --  83.0 81.9  PLT 261  --  213 220   Cardiac Enzymes: No results for input(s): CKTOTAL, CKMB,  CKMBINDEX, TROPONINI in the last 168 hours. BNP: Invalid input(s): POCBNP CBG: Recent Labs  Lab 12/17/18 1251  GLUCAP 164*   D-Dimer No results for input(s): DDIMER in the last 72 hours. Hgb A1c No results for input(s): HGBA1C in the last 72 hours. Lipid Profile No results for input(s): CHOL, HDL, LDLCALC, TRIG, CHOLHDL, LDLDIRECT in the last 72 hours. Thyroid function studies No results for input(s): TSH, T4TOTAL, T3FREE, THYROIDAB in the last 72 hours.  Invalid input(s): FREET3 Anemia work up No results for input(s): VITAMINB12, FOLATE, FERRITIN, TIBC, IRON, RETICCTPCT in the last 72 hours. Urinalysis    Component Value Date/Time   COLORURINE YELLOW 12/18/2018 [place] 12/18/2018 1520   LABSPEC 1.015 12/18/2018 1520   PHURINE 6.5 12/18/2018 1520   GLUCOSEU NEGATIVE 12/18/2018 1520   HGBUR TRACE (A) 12/18/2018 1520   BILIRUBINUR NEGATIVE 12/18/2018 1520   BILIRUBINUR n 01/03/2016 [place] 12/18/2018 [place]  NEGATIVE 12/18/2018 1520   UROBILINOGEN 2.0 (H) 08/24/2018 1936   NITRITE NEGATIVE 12/18/2018 1520   LEUKOCYTESUR TRACE (A) 12/18/2018 1520   Sepsis Labs Invalid input(s): PROCALCITONIN,  WBC,  LACTICIDVEN Microbiology Recent Results (from the past 240 hour(s))  SARS CORONAVIRUS 2 (TAT 6-24 HRS) Nasopharyngeal Nasopharyngeal Swab     Status: None   Collection Time: 12/17/18  2:24 PM   Specimen: Nasopharyngeal Swab  Result Value Ref Range Status   SARS Coronavirus 2 NEGATIVE NEGATIVE Final    Comment: (NOTE) SARS-CoV-2 target nucleic acids are NOT DETECTED. The SARS-CoV-2 RNA is generally detectable in upper and lower respiratory specimens during the acute phase of infection. Negative results do not preclude SARS-CoV-2 infection, do not rule out co-infections with other pathogens, and should not be used as the sole basis for treatment or other patient management decisions. Negative results must be combined with  clinical observations, patient history, and epidemiological information. The expected result is Negative. Fact Sheet for Patients: SugarRoll.be Fact Sheet for Healthcare Providers: https://www.woods-mathews.com/ This test is not yet approved or cleared by the [place] FDA and  has been authorized for detection and/or diagnosis of SARS-CoV-2 by FDA under an Emergency Use Authorization (EUA). This EUA will remain  in effect (meaning this test can be used) for the duration of the COVID-19 declaration under Section 56 4(b)(1) of the Act, 21 U.S.C. section 360bbb-3(b)(1), unless the authorization is terminated or revoked sooner. Performed at [facility], [place] [address], [place],  [address]      Patient was seen and examined on the day of discharge and was found to be in stable condition. Time coordinating discharge: 25 minutes including assessment and coordination of care, as well as examination of the patient.   SIGNED:  Dessa Phi, DO [facility] 12/19/2018, 10:37 AM

## 2018-12-19 NOTE — TOC Progression Note (Signed)
Transition of Care [facility]) - Progression Note    Patient Details  Name: Nicole Blackwell MRN: [id number] Date of Birth: December 17, 1935  Transition of Care [facility]) CM/SW Contact  Zenon Mayo, RN Phone Number: 12/19/2018, 9:13 AM  Clinical Narrative:    Patient is with [facility], they will provide her transport when she goes home because her daughter does not drive.   Please call [phone] for Alysa with [facility] to let her know.  Patient lives with daughter , Nicole Blackwell.         Expected Discharge Plan and Services                                                 Social Determinants of Health (SDOH) Interventions    Readmission Risk Interventions No flowsheet data found.

## 2018-12-19 NOTE — Evaluation (Signed)
Physical Therapy Evaluation Patient Details Name: Nicole Blackwell MRN: [id number] DOB: 31-Mar-1935 Today's Date: 12/19/2018   History of Present Illness  Patient is a 83 y/o female who presents with episode of vomiting and agitation. Concern for aspiration PNA. Admitted with Acute metabolic encephalopathy with underlying dementia. PMH includes dementia, uterine ca, HTN, back pain, diastolic heart failure.  Clinical Impression  Patient presents with impaired balance, baseline cognitive deficits, generalized weakness and impaired mobility s/p above. Pt has hx of dementia so not the best historian but reports her daughter lives with her and she is a member of [facility]. Today, pt tolerated transfers and gait training with MIn-Mod A for balance/safety. Pt with short shuffling steps requiring constant assist to maintain balance. Demonstrates poor awareness of safety putting pt at increased risk for falls. Pt will need hands on assist for all mobility at home. Would benefit from HHPT if [facility] able to provide/pt is not at functional baseline. Will follow acutely to maximize independence and mobility prior to return home.    Follow Up Recommendations Home health PT;Supervision for mobility/OOB;Supervision/Assistance - 24 hour    Equipment Recommendations  None recommended by PT    Recommendations for Other Services       Precautions / Restrictions Precautions Precautions: Fall Restrictions Weight Bearing Restrictions: No      Mobility  Bed Mobility Overal bed mobility: Needs Assistance Bed Mobility: Supine to Sit;Sit to Supine     Supine to sit: Min guard;HOB elevated Sit to supine: Min guard;HOB elevated   General bed mobility comments: Increased time but able to get to EOB with use of rails. Able to return to supine without assist.  Transfers Overall transfer level: Needs assistance Equipment used: 1 person hand held assist Transfers: Sit to/from Stand Sit to Stand: Mod assist;From elevated  surface         General transfer comment: ASsist to power to standing from EOB x1, stood from toilet using grab bars despite being told to ask for help. HHA and holding onto IV pole for support.  Ambulation/Gait Ambulation/Gait assistance: Min assist;Mod assist Gait Distance (Feet): 18 Feet(x2 bouts) Assistive device: IV Pole;1 person hand held assist Gait Pattern/deviations: Step-to pattern;Step-through pattern;Decreased step length - right;Decreased step length - left;Narrow base of support;Shuffle Gait velocity: decreased   General Gait Details: Slow, shuffling like gait with episodes of freezing, cues to increase step length. Constant Min-mod A for balance/safety.  Stairs            Wheelchair Mobility    Modified Rankin (Stroke Patients Only)       Balance Overall balance assessment: History of Falls;Needs assistance Sitting-balance support: Feet supported;No upper extremity supported Sitting balance-Leahy Scale: Fair Sitting balance - Comments: Posterior bias Postural control: Posterior lean Standing balance support: During functional activity Standing balance-Leahy Scale: Poor Standing balance comment: Requires external support.                             Pertinent Vitals/Pain Pain Assessment: Faces Faces Pain Scale: No hurt    Home Living Family/patient expects to be discharged to:: Private residence Living Arrangements: Children(daughter,. [name]\) Available Help at Discharge: Family Type of Home: House Home Access: Stairs to enter   [employer] of Steps: just a few   Home Equipment: None Additional Comments: Poor historian with baseline dementia. Pt reporting she lives with her daughter and husband. Chart review state pt lives alone    Prior Function Level of  Independence: Needs assistance   Gait / Transfers Assistance Needed: Reports Mod I without device  ADL's / Homemaking Assistance Needed: Reports doing ADLs without  assist?  Comments: Poor historian with baseline dementia. No family present to provide information. Pt reports daughter lives with her, member of [employer]. Not sure of accuracy.     Hand Dominance   Dominant Hand: Right    Extremity/Trunk Assessment   Upper Extremity Assessment Upper Extremity Assessment: Defer to OT evaluation    Lower Extremity Assessment Lower Extremity Assessment: Generalized weakness    Cervical / Trunk Assessment Cervical / Trunk Assessment: Kyphotic  Communication   Communication: No difficulties  Cognition Arousal/Alertness: Awake/alert Behavior During Therapy: WFL for tasks assessed/performed Overall Cognitive Status: History of cognitive impairments - at baseline                                        General Comments General comments (skin integrity, edema, etc.): VSS    Exercises     Assessment/Plan    PT Assessment Patient needs continued PT services  PT Problem List Decreased strength;Decreased mobility;Decreased safety awareness;Decreased balance;Decreased cognition;Decreased activity tolerance       PT Treatment Interventions Therapeutic activities;Cognitive remediation;Gait training;Therapeutic exercise;Patient/family education;Balance training;Functional mobility training    PT Goals (Current goals can be found in the Care Plan section)  Acute Rehab PT Goals Patient Stated Goal: to go to the bathroom PT Goal Formulation: With patient Time For Goal Achievement: 01/02/19 Potential to Achieve Goals: Fair    Frequency Min 3X/week   Barriers to discharge Other (comment) unsure of level of support    Co-evaluation               AM-PAC PT "6 Clicks" Mobility  Outcome Measure Help needed turning from your back to your side while in a flat bed without using bedrails?: A Little Help needed moving from lying on your back to sitting on the side of a flat bed without using bedrails?: A Little Help needed moving to  and from a bed to a chair (including a wheelchair)?: A Lot Help needed standing up from a chair using your arms (e.g., wheelchair or bedside chair)?: A Lot Help needed to walk in hospital room?: A Lot Help needed climbing 3-5 steps with a railing? : Total 6 Click Score: 13    End of Session Equipment Utilized During Treatment: Gait belt Activity Tolerance: Patient tolerated treatment well Patient left: in bed;with call bell/phone within reach;with bed alarm set Nurse Communication: Mobility status PT Visit Diagnosis: Unsteadiness on feet (R26.81);Difficulty in walking, not elsewhere classified (R26.2)    Time: [id number] PT Time Calculation (min) (ACUTE ONLY): 25 min   Charges:   PT Evaluation $PT Eval Moderate Complexity: 1 Mod PT Treatments $Gait Training: 8-22 mins        Wray Kearns, PT, DPT Acute Rehabilitation Services Pager [phone] Office [place] 12/19/2018, 11:34 AM

## 2018-12-20 ENCOUNTER — Emergency Department (HOSPITAL_COMMUNITY)
Admission: EM | Admit: 2018-12-20 | Discharge: 2018-12-20 | Disposition: A | Discharge status: Home / Self Care | Payer: Medicare (Managed Care) | Attending: Emergency Medicine | Admitting: Emergency Medicine

## 2018-12-20 ENCOUNTER — Other Ambulatory Visit: Payer: Self-pay

## 2018-12-20 ENCOUNTER — Encounter (HOSPITAL_COMMUNITY): Payer: Self-pay | Admitting: Emergency Medicine

## 2018-12-20 ENCOUNTER — Emergency Department (HOSPITAL_COMMUNITY): Payer: Medicare (Managed Care)

## 2018-12-20 DIAGNOSIS — Z0489 Encounter for examination and observation for other specified reasons: Secondary | ICD-10-CM | POA: Diagnosis present

## 2018-12-20 DIAGNOSIS — Z7982 Long term (current) use of aspirin: Secondary | ICD-10-CM | POA: Insufficient documentation

## 2018-12-20 DIAGNOSIS — W19XXXA Unspecified fall, initial encounter: Secondary | ICD-10-CM | POA: Insufficient documentation

## 2018-12-20 DIAGNOSIS — Z79899 Other long term (current) drug therapy: Secondary | ICD-10-CM | POA: Insufficient documentation

## 2018-12-20 DIAGNOSIS — F0150 Vascular dementia without behavioral disturbance: Secondary | ICD-10-CM | POA: Insufficient documentation

## 2018-12-20 DIAGNOSIS — I5030 Unspecified diastolic (congestive) heart failure: Secondary | ICD-10-CM | POA: Insufficient documentation

## 2018-12-20 DIAGNOSIS — I110 Hypertensive heart disease with heart failure: Secondary | ICD-10-CM | POA: Insufficient documentation

## 2018-12-20 LAB — COMPREHENSIVE METABOLIC PANEL
ALT: 13 U/L (ref 0–44)
AST: 25 U/L (ref 15–41)
Albumin: 3.5 g/dL (ref 3.5–5.0)
Alkaline Phosphatase: 61 U/L (ref 38–126)
Anion gap: 11 (ref 5–15)
BUN: 13 mg/dL (ref 8–23)
CO2: 24 mmol/L (ref 22–32)
Calcium: 9 mg/dL (ref 8.9–10.3)
Chloride: 102 mmol/L (ref 98–111)
Creatinine, Ser: 1.07 mg/dL — ABNORMAL HIGH (ref 0.44–1.00)
GFR calc Af Amer: 56 mL/min — ABNORMAL LOW (ref >60)
GFR calc non Af Amer: 49 mL/min — ABNORMAL LOW (ref >60)
Glucose, Bld: 113 mg/dL — ABNORMAL HIGH (ref 70–99)
Potassium: 3.8 mmol/L (ref 3.5–5.1)
Sodium: 137 mmol/L (ref 135–145)
Total Bilirubin: 1.2 mg/dL (ref 0.3–1.2)
Total Protein: 7.1 g/dL (ref 6.5–8.1)

## 2018-12-20 LAB — CBC WITH DIFFERENTIAL/PLATELET
Abs Immature Granulocytes: 0.03 10*3/uL (ref 0.00–0.07)
Basophils Absolute: 0 10*3/uL (ref 0.0–0.1)
Basophils Relative: 0 %
Eosinophils Absolute: 0 10*3/uL (ref 0.0–0.5)
Eosinophils Relative: 0 %
HCT: 47.9 % — ABNORMAL HIGH (ref 36.0–46.0)
Hemoglobin: 14.6 g/dL (ref 12.0–15.0)
Immature Granulocytes: 0 %
Lymphocytes Relative: 30 %
Lymphs Abs: 2.5 10*3/uL (ref 0.7–4.0)
MCH: 25.5 pg — ABNORMAL LOW (ref 26.0–34.0)
MCHC: 30.5 g/dL (ref 30.0–36.0)
MCV: 83.7 fL (ref 80.0–100.0)
Monocytes Absolute: 0.5 10*3/uL (ref 0.1–1.0)
Monocytes Relative: 6 %
Neutro Abs: 5.2 10*3/uL (ref 1.7–7.7)
Neutrophils Relative %: 64 %
Platelets: 227 10*3/uL (ref 150–400)
RBC: 5.72 MIL/uL — ABNORMAL HIGH (ref 3.87–5.11)
RDW: 15.5 % (ref 11.5–15.5)
WBC: 8.2 10*3/uL (ref 4.0–10.5)
nRBC: 0 % (ref 0.0–0.2)

## 2018-12-20 IMAGING — CR DG THORACIC SPINE 2V
4 series · 4 of 4 positions shown · non-contrast
Comparison: CT angio chest [DATE]

CLINICAL DATA: Fall with tenderness to palpation at midthoracic
spine, multiple falls yesterday

EXAM:
THORACIC SPINE 2 VIEWS

[t-spine ap (1 of 2)]
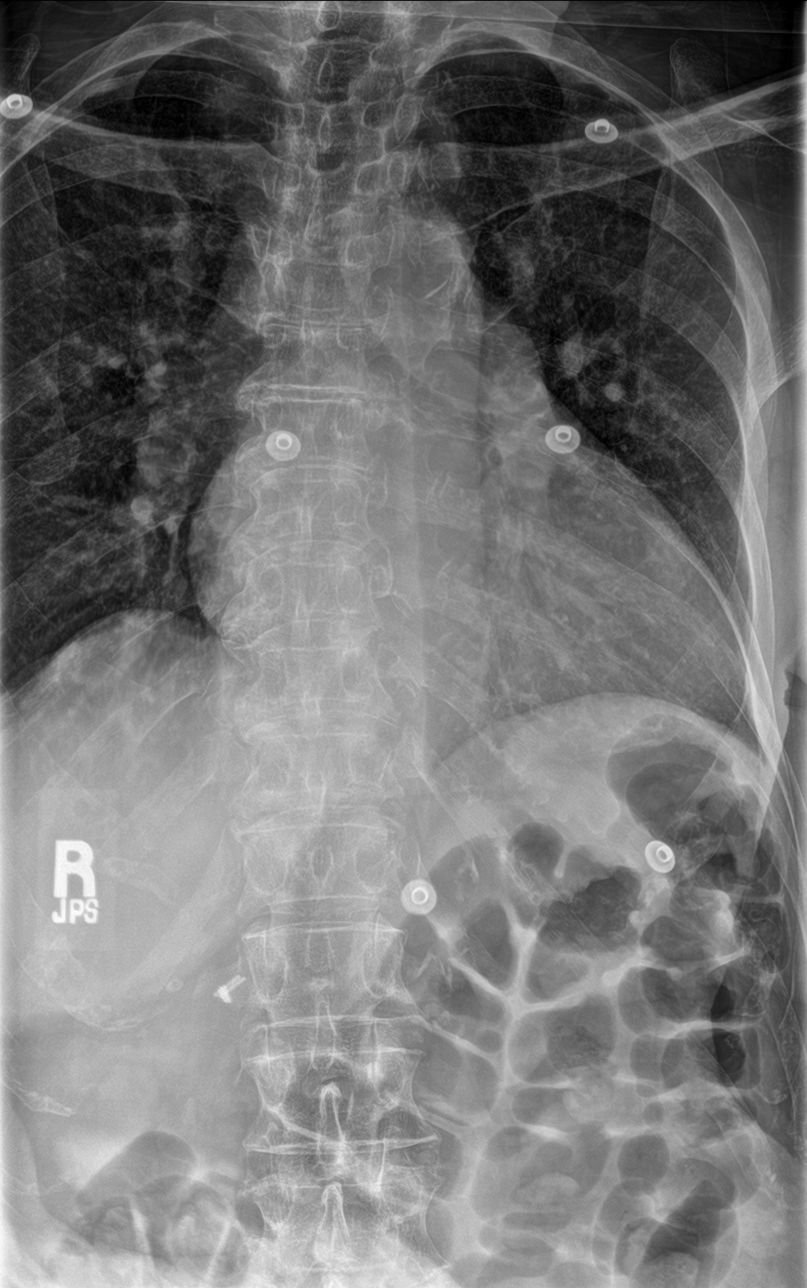

[t-spine lat]
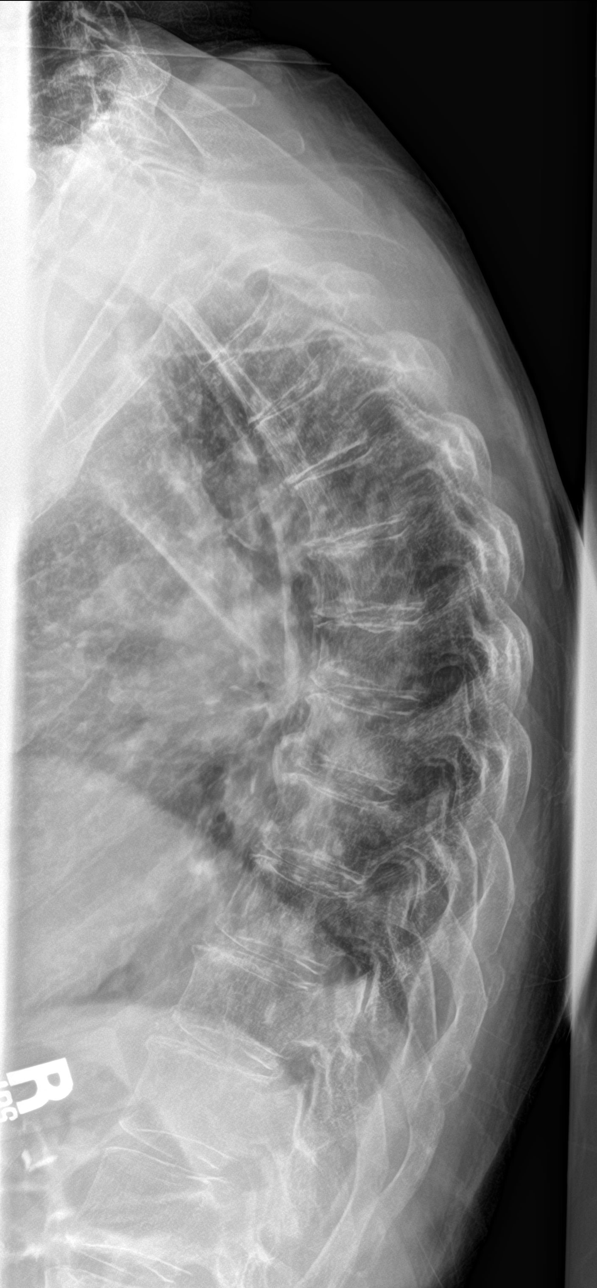

[t-spine swimmers]
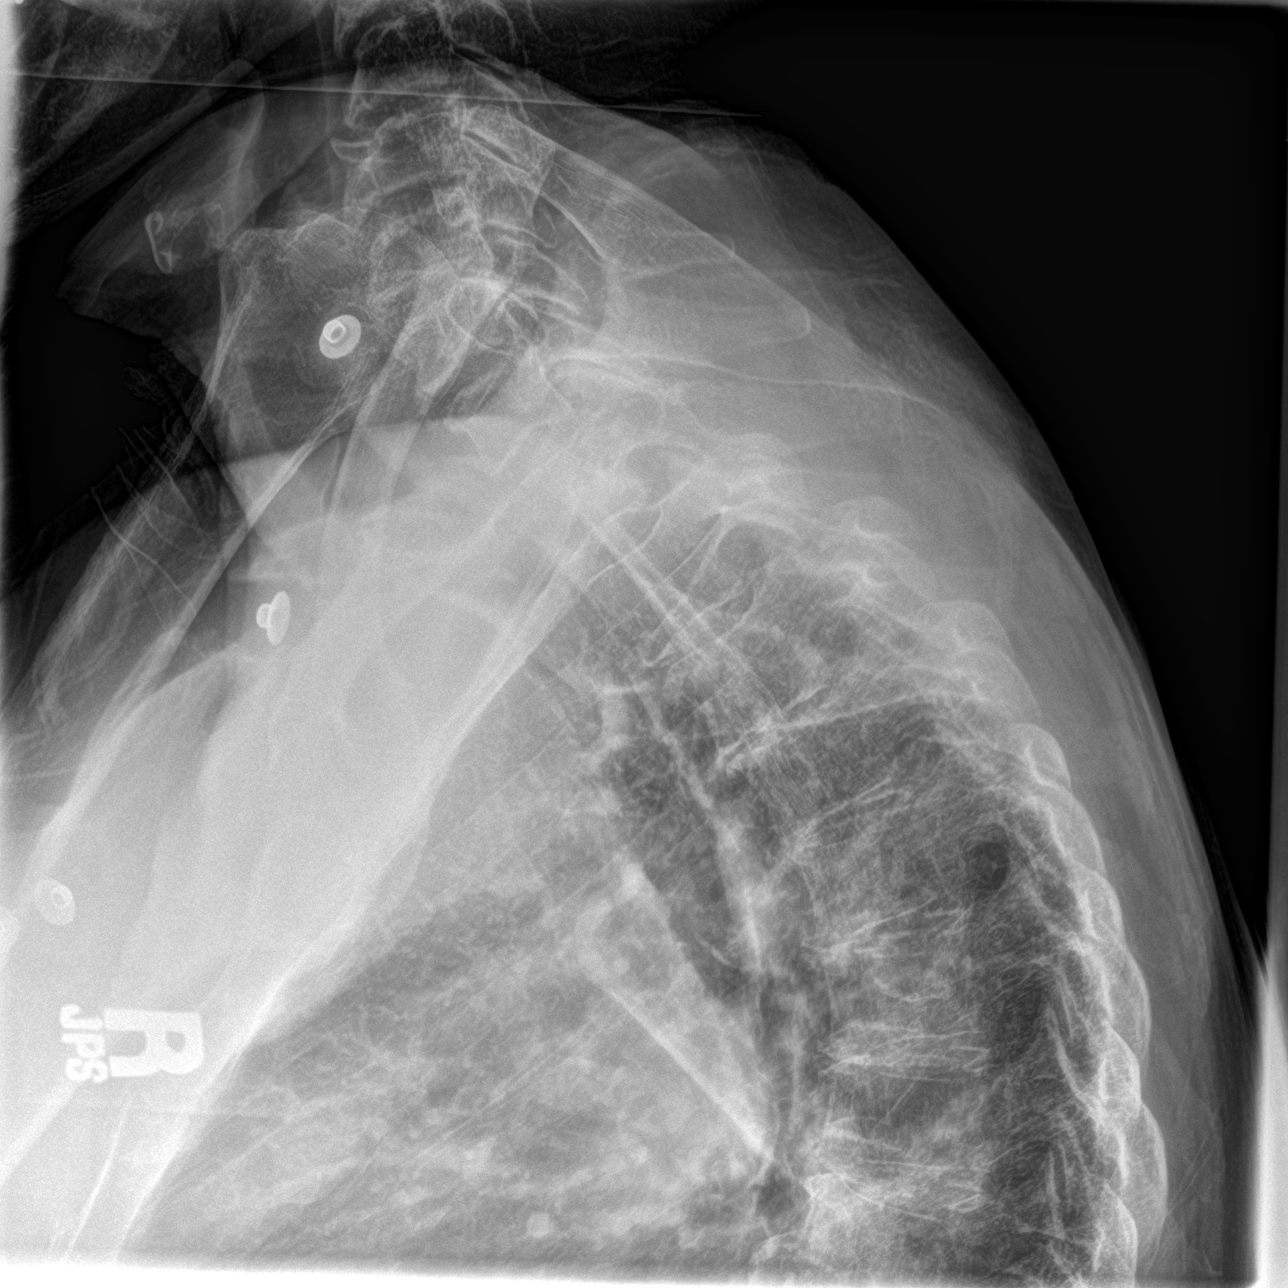

[t-spine ap (2 of 2)]
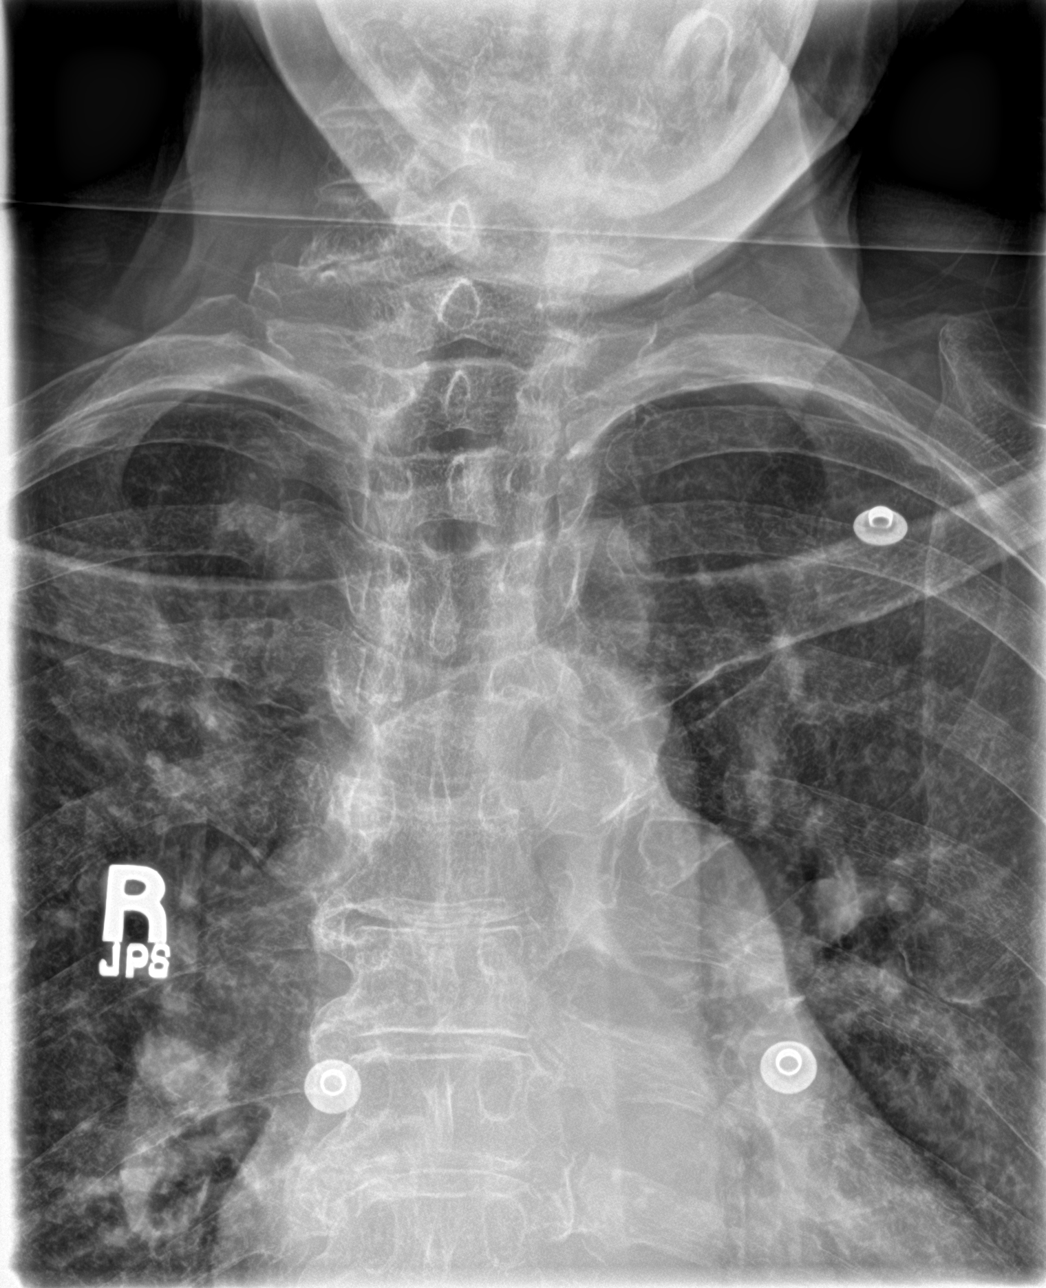

[4 of 4 positions shown; findings below may reference images not displayed]

FINDINGS: Twelve pairs of ribs.

Bones demineralized.

Multilevel disc space narrowing and endplate spur formation.

Vertebral body heights maintained without fracture or subluxation.

Atherosclerotic calcification aorta.
IMPRESSION: Osseous demineralization with degenerative disc disease changes
thoracic spine.

No acute abnormalities.

Aortic Atherosclerosis ([RM]-[RM]).

## 2018-12-20 IMAGING — CR DG HIP (WITH OR WITHOUT PELVIS) 2-3V*L*
3 series · 3 of 3 positions shown · non-contrast
Comparison: None

CLINICAL DATA: At multiple falls yesterday, reacts when moving LEFT
leg, history dementia

EXAM:
DG HIP (WITH OR WITHOUT PELVIS) 2-3V LEFT

[pelvis ap]
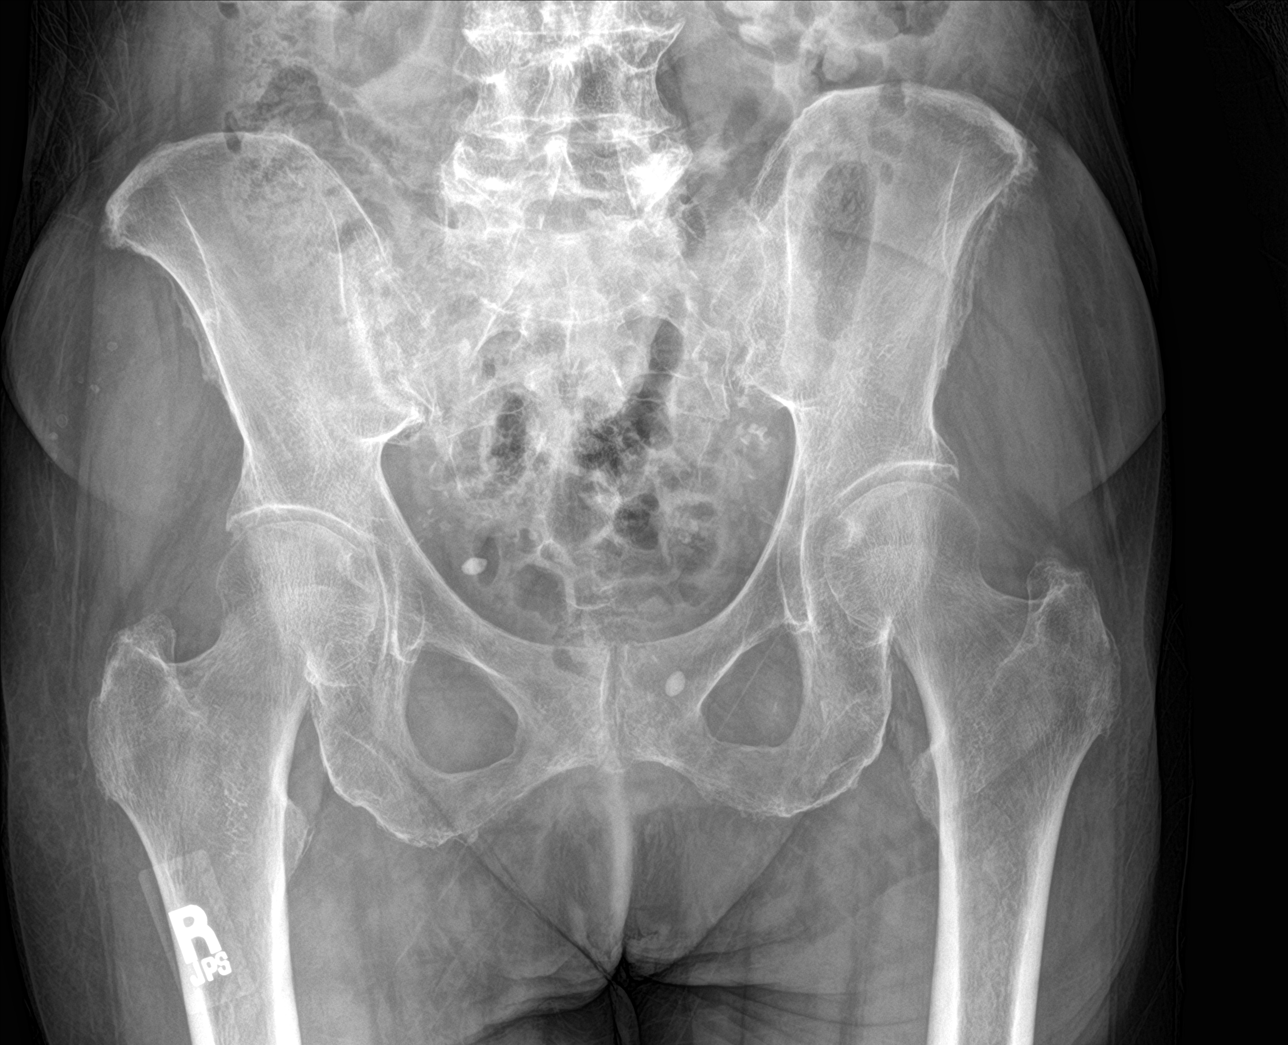

[hip ap]
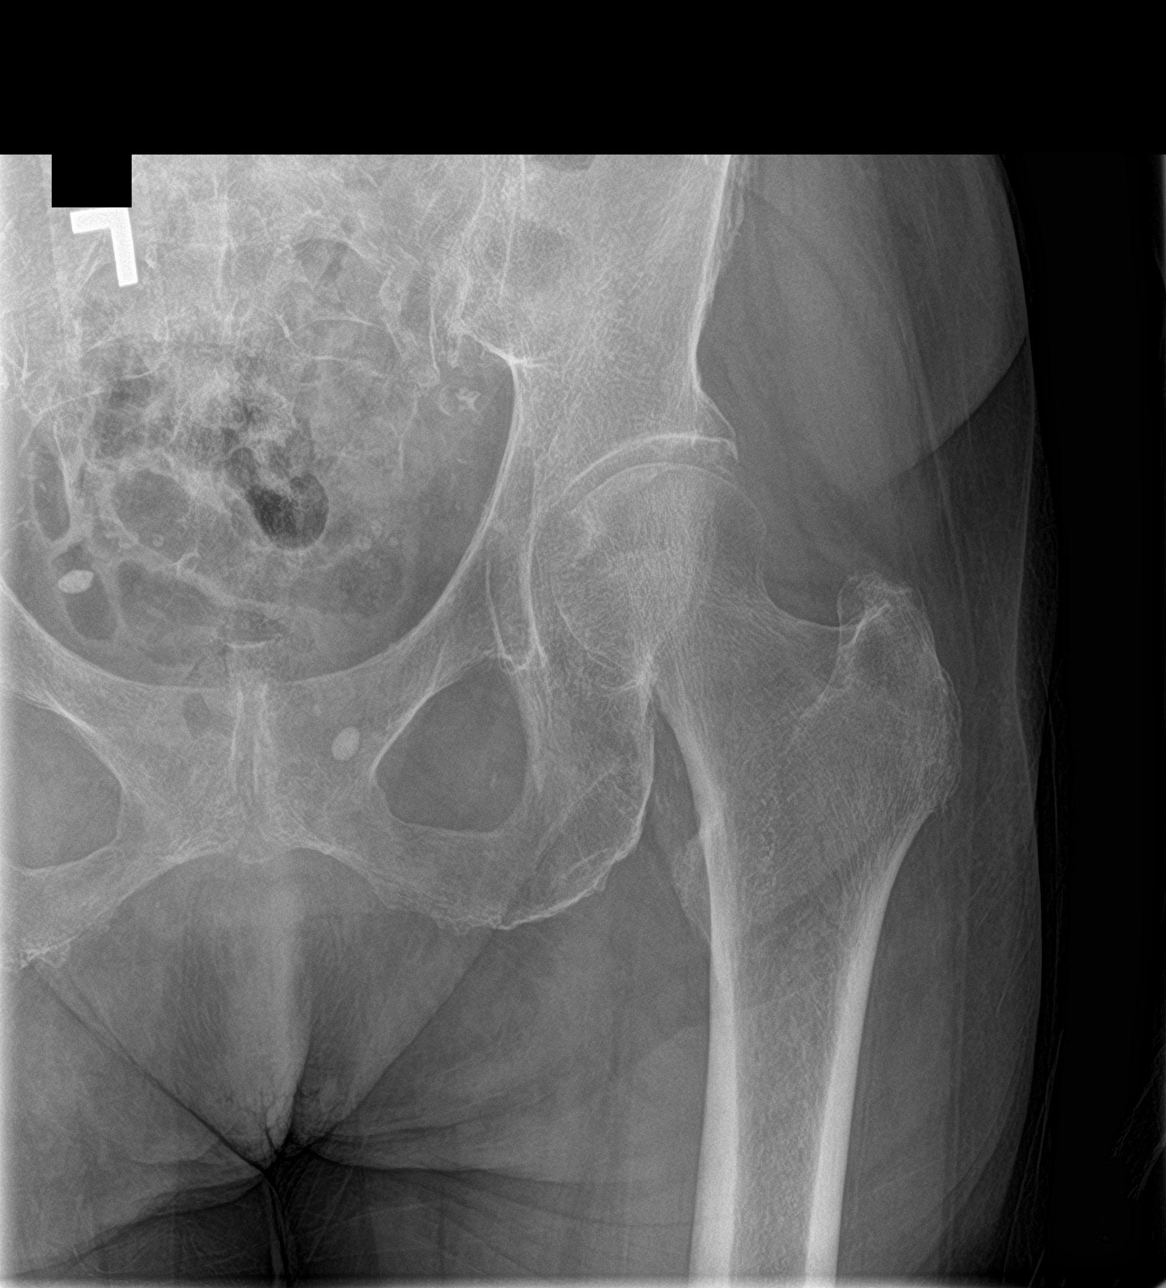

[hip lat]
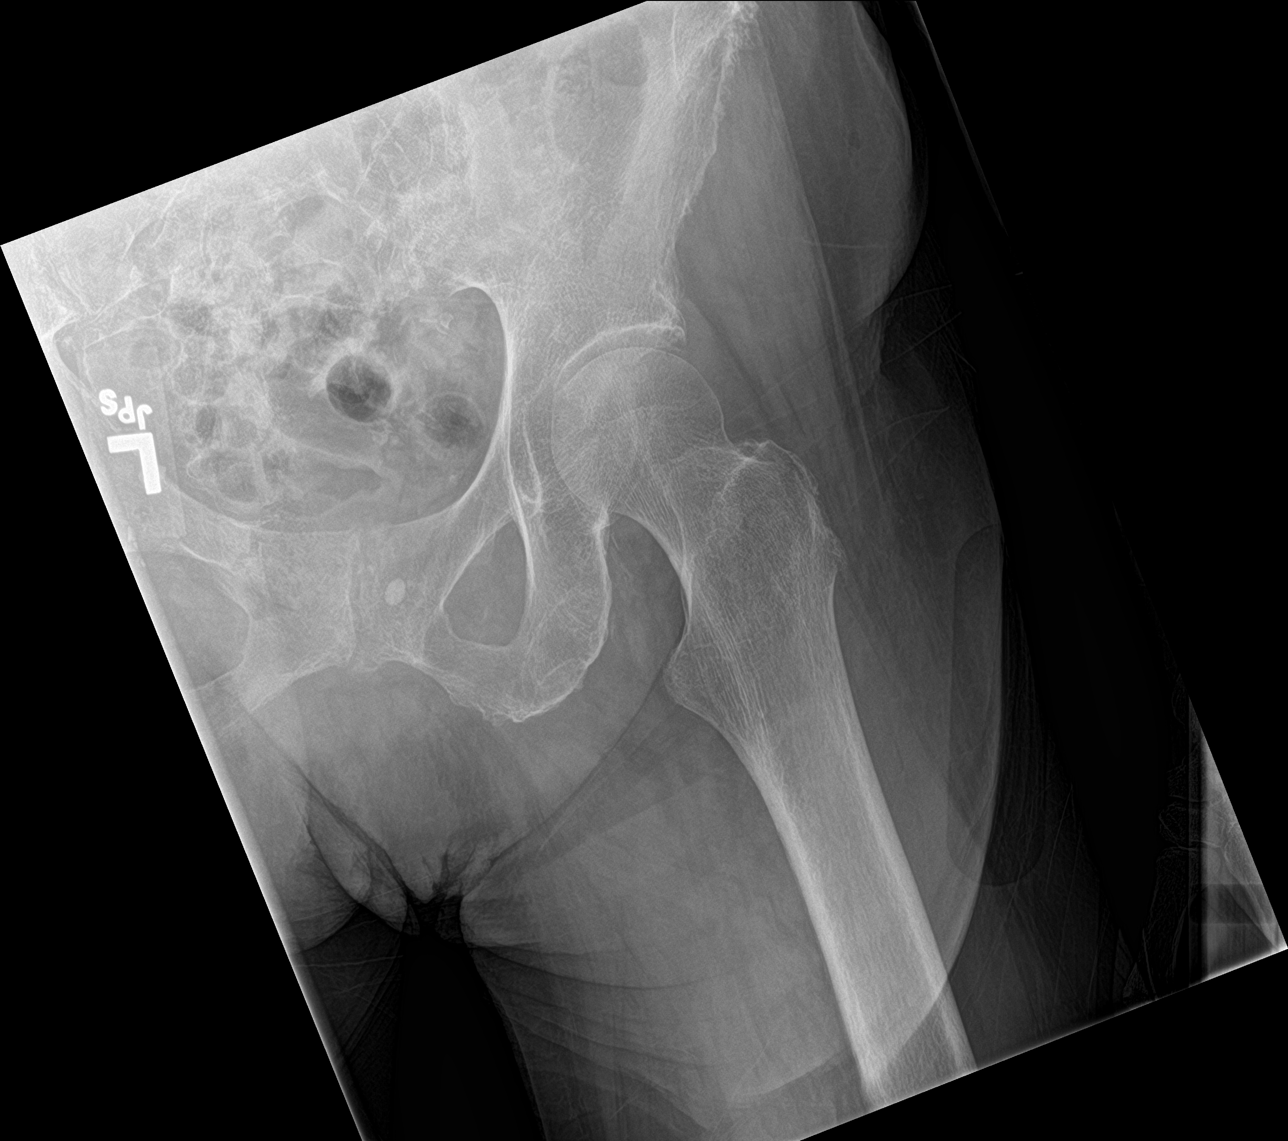

[3 of 3 positions shown; findings below may reference images not displayed]

FINDINGS: Osseous demineralization.

Hip joint spaces preserved.

Question mild sclerosis and irregularity at RIGHT SI joint.

Degenerative disc and facet disease changes at visualized lower
lumbar spine.

No acute fracture, dislocation, or bone destruction.

Pelvic phleboliths and atherosclerotic calcifications noted.
IMPRESSION: No acute LEFT hip abnormalities.

Degenerative disc and facet disease changes of visualized lower
lumbar spine.

Question RIGHT sacroiliitis.

## 2018-12-20 IMAGING — CT CT HEAD W/O CM
5 of 8 series · 17 of 47 positions shown, 18 images · non-contrast
Comparison: [DATE]

CLINICAL DATA: C-spine trauma. Un witnessed presumed fall, 3
reported falls yesterday.

EXAM:
CT HEAD WITHOUT CONTRAST
CT CERVICAL SPINE WITHOUT CONTRAST
TECHNIQUE: Multidetector CT imaging of the head and cervical spine was
performed following the standard protocol without intravenous
contrast. Multiplanar CT image reconstructions of the cervical spine
were also generated.

[Series 4: head bone · axial · 0.42mm/px · z∈[+1067,+1149]mm · 3 of 83 slices shown, 4 images]
[im 21/83  brain]
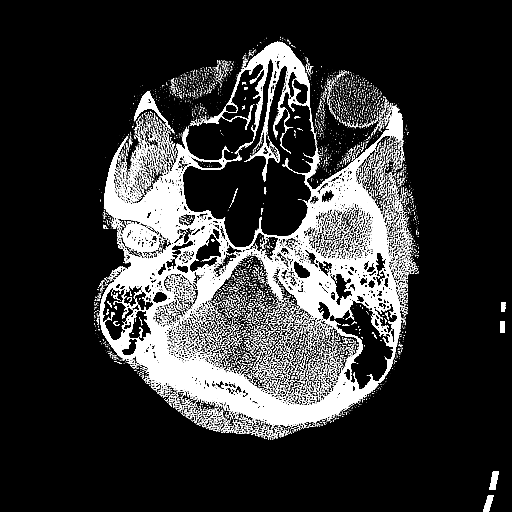
[im 21/83  bone]
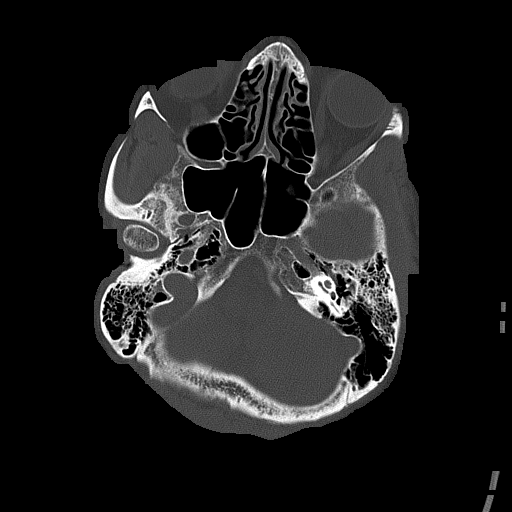
[im 42/83  brain]
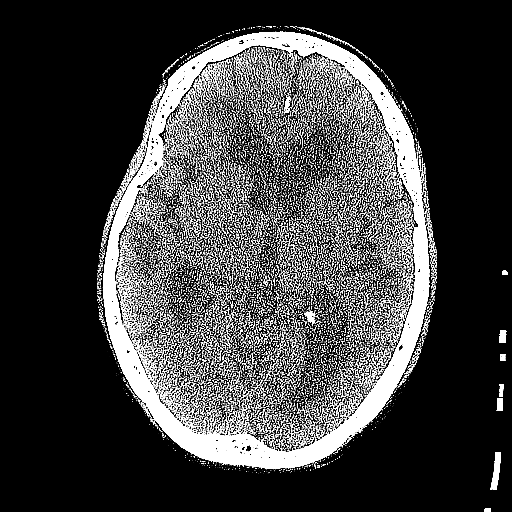
[im 62/83  brain]
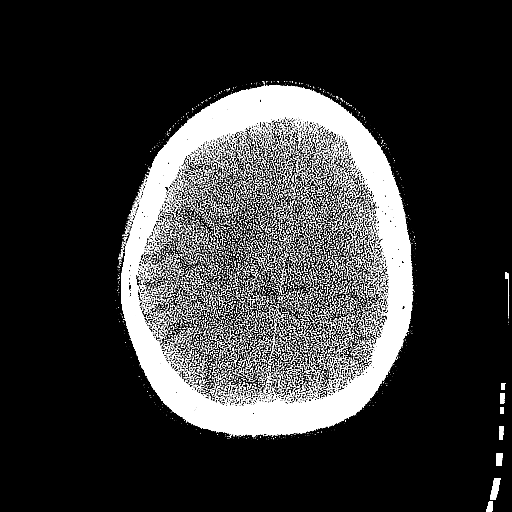

[Series 5: head without cor · coronal · non-contrast · 0.29mm/px · 3 of 72 slices shown]
[im 18/72  brain]
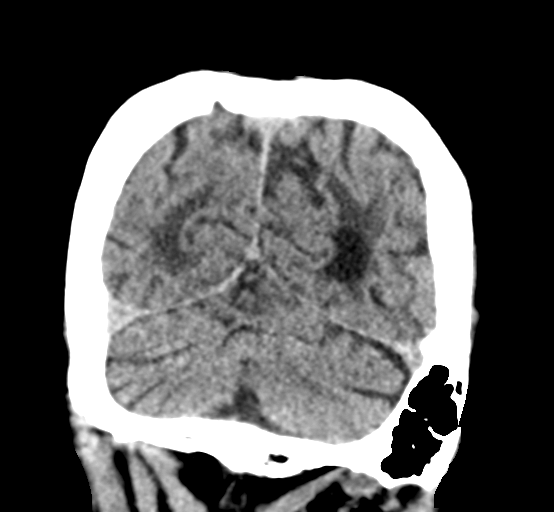
[im 36/72  brain]
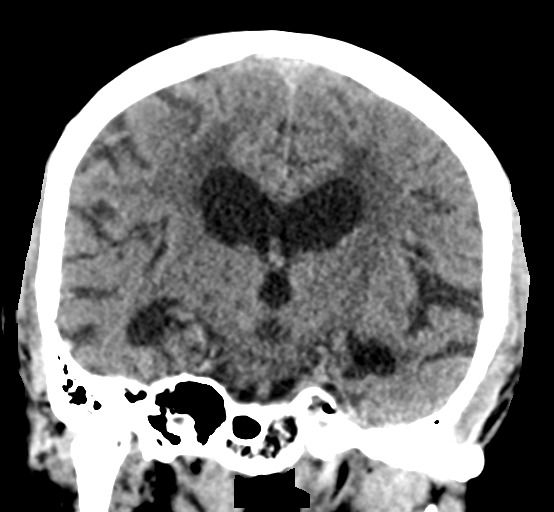
[im 54/72  brain]
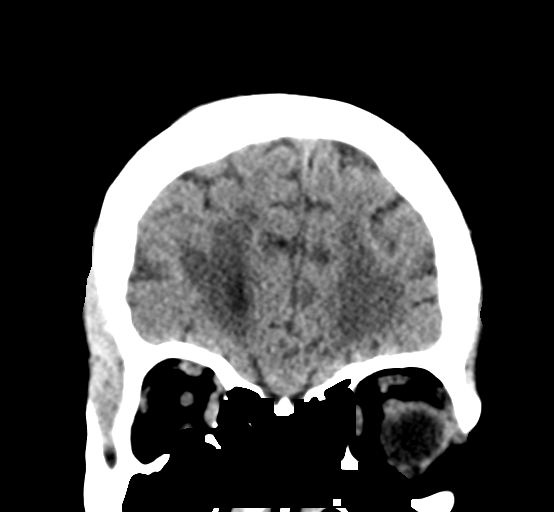

[Series 6: head without sag · sagittal · non-contrast · 0.33mm/px · 1 of 55 slices shown]
[im 28/55  brain]
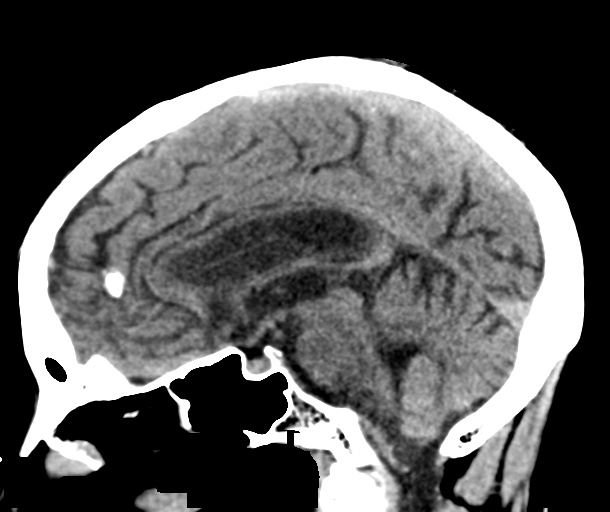

[Series 11: c_spine 1.0 st thins · axial · 0.27mm/px · z∈[+883,+1059]mm · 8 of 290 slices shown]
[im 19/290  brain]
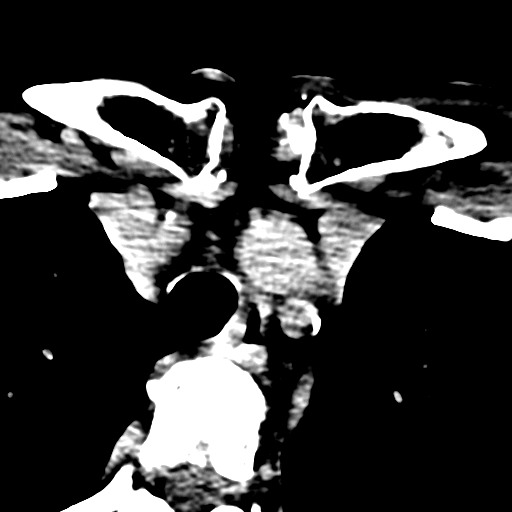
[im 55/290  brain]
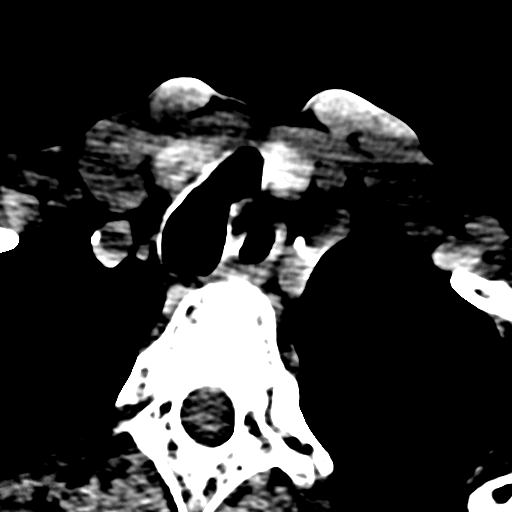
[im 91/290  brain]
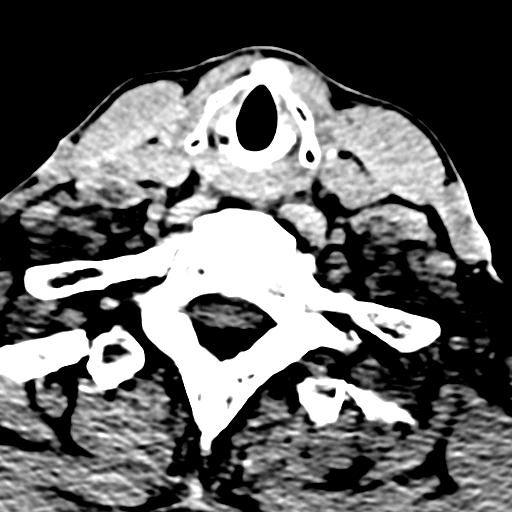
[im 127/290  brain]
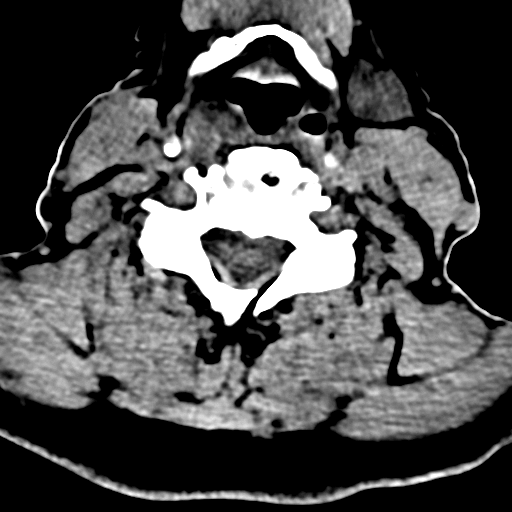
[im 163/290  brain]
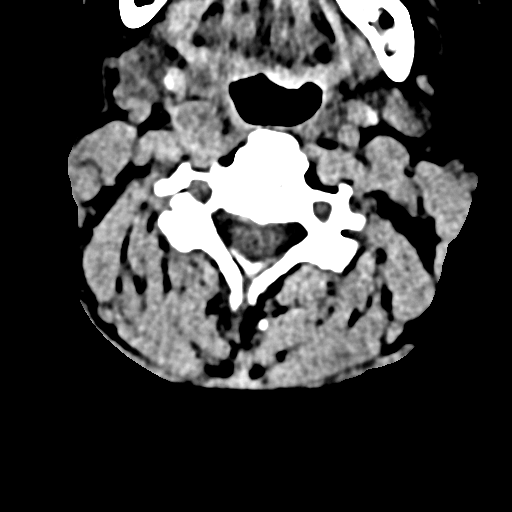
[im 199/290  brain]
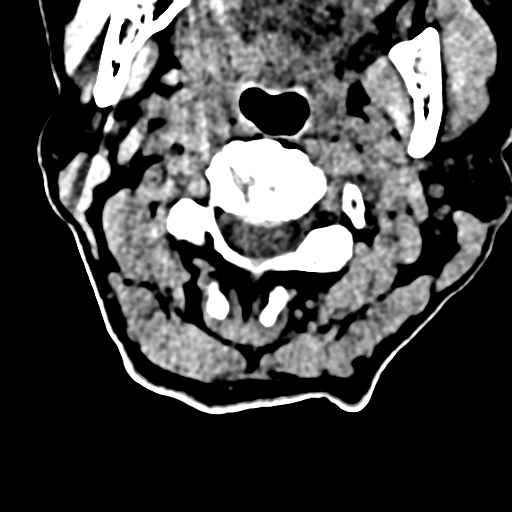
[im 235/290  brain]
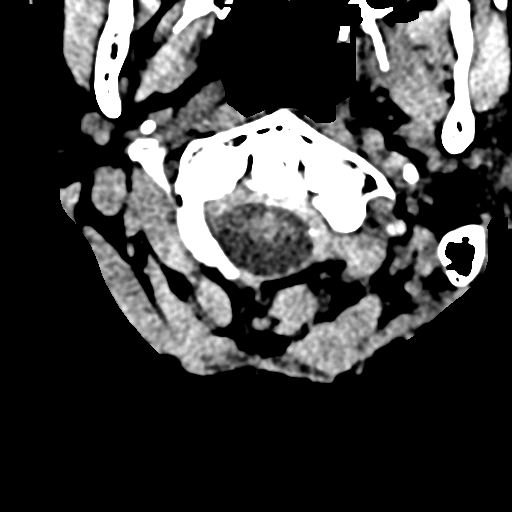
[im 271/290  brain]
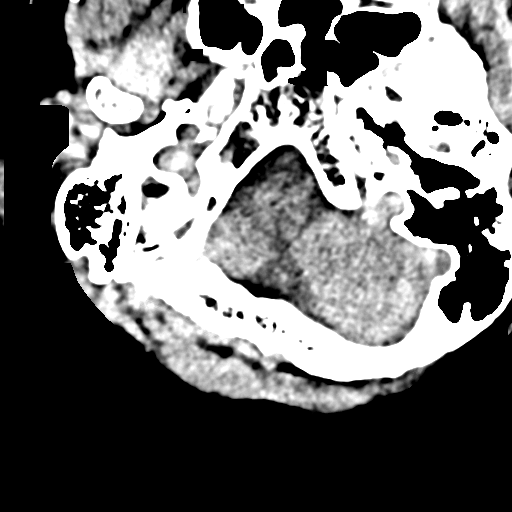

[Series 14: c_spine 2.0 orthogonals · oblique · 0.21mm/px · 2 of 111 slices shown]
[im 23/111  brain]
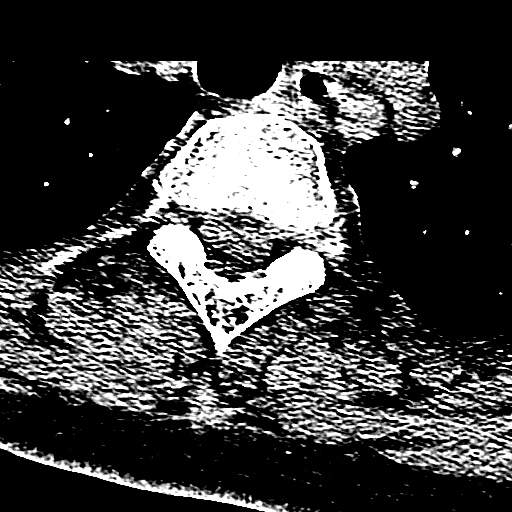
[im 45/111  brain]
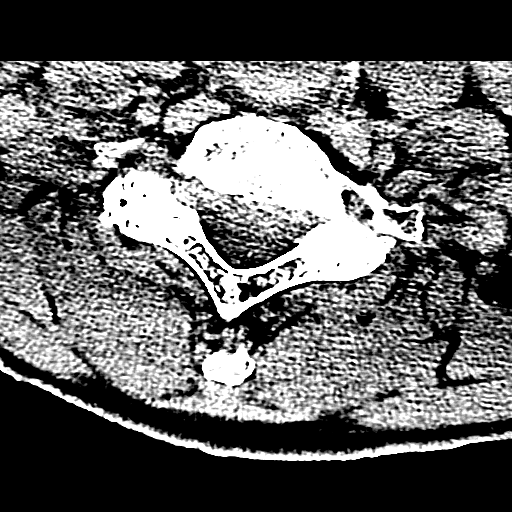

[17 of 47 positions shown; findings below may reference images not displayed]

FINDINGS: CT HEAD FINDINGS

Brain: No evidence of acute infarction, hemorrhage, hydrocephalus,
extra-axial collection or mass lesion/mass effect. Signs of atrophy
and chronic microvascular ischemic change are unchanged since
[DATE].

Vascular: No hyperdense vessel or unexpected calcification.

Skull: Normal. Negative for fracture or focal lesion.

Sinuses/Orbits: No acute finding.

Other: None.

CT CERVICAL SPINE FINDINGS

Alignment: Degenerative changes with mild reversal of normal
cervical lordosis the upper cervical spine. Alignment otherwise
unremarkable and unchanged from prior.

Skull base and vertebrae: No acute fracture. No primary bone lesion
or focal pathologic process.

Soft tissues and spinal canal: No prevertebral fluid or swelling. No
visible canal hematoma.

Disc levels: Multilevel degenerative changes with disc space
narrowing at C2-3, C3-4, C4-5, C5-6 and C6-7. Moderate size anterior
osteophytes noted at C4.

Upper chest: Negative.

Other: None
IMPRESSION: 1. No acute intracranial abnormality.
2. No fracture or traumatic malalignment in the cervical spine.
Multilevel degenerative changes similar to prior study.

## 2018-12-20 NOTE — Discharge Planning (Signed)
EDCM contacted [facility] for transportation home today.

## 2018-12-20 NOTE — Discharge Instructions (Addendum)
Please read attached information. If you experience any new or worsening signs or symptoms please return to the emergency room for evaluation. Please follow-up with your primary care provider or specialist as discussed.  °

## 2018-12-20 NOTE — ED Triage Notes (Addendum)
Patient found on the floor by daughter at home this evening , patient unable to recall incident , denies pain , respirations unlabored . Daughter reported she fell 3 times yesterday . CBG= 139 by EMS.

## 2018-12-20 NOTE — ED Notes (Signed)
Attempted to collect urine specimen, asked pt to not throw paper in the specimen, pt stated "it is my urine and I will do what I want".

## 2018-12-20 NOTE — ED Notes (Signed)
Patient verbalizes understanding of discharge instructions. Opportunity for questioning and answers were provided. Armband removed by staff, pt discharged from ED.  

## 2018-12-20 NOTE — ED Notes (Signed)
Patient transported to CT 

## 2018-12-20 NOTE — ED Provider Notes (Signed)
[facility] EMERGENCY DEPARTMENT Provider Note   CSN: [id number] Arrival date & time: 12/20/18  0357     History   Chief Complaint Chief Complaint  Patient presents with   Fall    HPI ALEISHA LONGWELL is a 83 y.o. female.     HPI   Level 5 caveat due to dementia  83 year old female presents today status post fall.  Patient has a history of dementia and is uncertain as to what happened.  She denies any specific pain, notes that she is hungry, she is evasive on my questioning including person place time and event.  Nursing notes that family found patient on the floor at home.  She was most recently discharged from the hospital yesterday.   Past Medical History:  Diagnosis Date   BACK PAIN    Hypertension    OSTEOPOROSIS    PLANTAR FASCIITIS    Positive PPD    SHINGLES    SYMPTOMATIC MENOPAUSAL/FEMALE CLIMACTERIC STATES    Syncope and collapse 12/13/2014   Uterine cancer [facility])     Patient Active Problem List   Diagnosis Date Noted   AMS (altered mental status) 12/17/2018   Fatigue 08/27/2018   Protein calorie malnutrition ([place]) 05/24/2018   Hypoalbuminemia 05/23/2018   Abnormal EEG 05/22/2018   Dementia ([place]) 05/21/2018   Fall 05/20/2018   Rhabdomyolysis 05/20/2018   Bilateral lower extremity edema 05/20/2018   Serum total bilirubin elevated 05/20/2018   B12 deficiency 01/17/2018   Vascular dementia without behavioral disturbance ([place]) 01/17/2018   Laceration of scalp without foreign body    Diastolic CHF ([place]) [id number]   Syncope 01/12/2012   BACK PAIN 04/30/2008   SHINGLES 09/27/2007   SYMPTOMATIC MENOPAUSAL/FEMALE CLIMACTERIC STATES 06/20/2007   Osteoporosis 03/11/2007   PLANTAR FASCIITIS 11/16/2006    Past Surgical History:  Procedure Laterality Date   CATARACT EXTRACTION, BILATERAL Bilateral    DILATION AND CURETTAGE OF UTERUS  1960's X 2   "I lost 2 children to miscarriage"   LAPAROSCOPIC  CHOLECYSTECTOMY     SIGMOIDOSCOPY     TONSILLECTOMY     VAGINAL HYSTERECTOMY       OB History   No obstetric history on file.      Home Medications    Prior to Admission medications   Medication Sig Start Date End Date Taking? Authorizing Provider  acetaminophen (TYLENOL) 325 MG tablet Take 2 tablets (650 mg total) by mouth every 6 (six) hours as needed for mild pain or headache (or Fever >/= 101). 05/24/18   Norval Morton, MD  aspirin EC 81 MG tablet Take 81 mg by mouth at bedtime.    [provider]  cefdinir (OMNICEF) 300 MG capsule Take 1 capsule (300 mg total) by mouth 2 (two) times daily for 5 days. 12/19/18 12/24/18  Dessa Phi, DO  Cholecalciferol 1.25 MG (50000 UT) capsule Take 50,000 Units by mouth every Monday.    [provider]  losartan (COZAAR) 25 MG tablet TAKE 1 TABLET(25 MG) BY MOUTH DAILY Patient taking differently: Take 25 mg by mouth daily.  09/18/18   Billie Ruddy, MD  metroNIDAZOLE (FLAGYL) 500 MG tablet Take 1 tablet (500 mg total) by mouth 3 (three) times daily for 5 days. 12/19/18 12/24/18  Dessa Phi, DO  vitamin B-12 (CYANOCOBALAMIN) 100 MCG tablet Take 100 mcg by mouth daily.    [provider]    Family History Family History  Problem Relation Age of Onset   Healthy Mother  Healthy Father     Social History Social History   Tobacco Use   Smoking status: Never Smoker   Smokeless tobacco: Never Used  Substance Use Topics   Alcohol use: No   Drug use: No     Allergies   Penicillin g benzathine and Penicillins   Review of Systems Review of Systems  Unable to perform ROS: Dementia     Physical Exam Updated Vital Signs BP (!) 184/83    Pulse 65    Temp 98.6 F (37 C) (Oral)    Resp 18    SpO2 100%   Physical Exam Vitals signs and nursing note reviewed.  Constitutional:      Appearance: She is well-developed.  HENT:     Head: Normocephalic and atraumatic.  Eyes:     General: No  scleral icterus.       Right eye: No discharge.        Left eye: No discharge.     Conjunctiva/sclera: Conjunctivae normal.     Pupils: Pupils are equal, round, and reactive to light.  Neck:     Musculoskeletal: Normal range of motion.     Vascular: No JVD.     Trachea: No tracheal deviation.  Pulmonary:     Effort: Pulmonary effort is normal.     Breath sounds: No stridor.  Musculoskeletal:     Comments: No cervical or lumbar spinal tenderness, tenderness palpation of mid thoracic region, no new signs of trauma old abrasion noted to the upper thoracic region, chest nontender abdomen soft nontender bilateral upper and lower extremities nontender with full active range of motion and strength.  Hips are stable with AP and lateral compression Minor tenderness palpation of the left anterior hip  Neurological:     General: No focal deficit present.     Mental Status: She is alert. Mental status is at baseline.     Cranial Nerves: No cranial nerve deficit.     Sensory: No sensory deficit.     Motor: No weakness.     Coordination: Coordination normal.  Psychiatric:        Behavior: Behavior normal.        Thought Content: Thought content normal.        Judgment: Judgment normal.      ED Treatments / Results  Labs (all labs ordered are listed, but only abnormal results are displayed) Labs Reviewed  CBC WITH DIFFERENTIAL/PLATELET - Abnormal; Notable for the following components:      Result Value   RBC 5.72 (*)    HCT 47.9 (*)    MCH 25.5 (*)    All other components within normal limits  COMPREHENSIVE METABOLIC PANEL - Abnormal; Notable for the following components:   Glucose, Bld 113 (*)    Creatinine, Ser 1.07 (*)    GFR calc non Af Amer 49 (*)    GFR calc Af Amer 56 (*)    All other components within normal limits    EKG None  Radiology Dg Thoracic Spine 2 View  Result Date: 12/20/2018 CLINICAL DATA:  Fall with tenderness to palpation at midthoracic spine, multiple  falls yesterday EXAM: THORACIC SPINE 2 VIEWS COMPARISON:  CT angio chest 10/06/2016 FINDINGS: Twelve pairs of ribs. Bones demineralized. Multilevel disc space narrowing and endplate spur formation. Vertebral body heights maintained without fracture or subluxation. Atherosclerotic calcification aorta. IMPRESSION: Osseous demineralization with degenerative disc disease changes thoracic spine. No acute abnormalities. Aortic Atherosclerosis (ICD10-I70.0). Electronically Signed   By: Elta [place]  Thornton Papas M.D.   On: 12/20/2018 10:16   Ct Head Wo Contrast  Result Date: 12/20/2018 CLINICAL DATA:  C-spine trauma. Un witnessed presumed fall, 3 reported falls yesterday. EXAM: CT HEAD WITHOUT CONTRAST CT CERVICAL SPINE WITHOUT CONTRAST TECHNIQUE: Multidetector CT imaging of the head and cervical spine was performed following the standard protocol without intravenous contrast. Multiplanar CT image reconstructions of the cervical spine were also generated. COMPARISON:  05/20/2018 FINDINGS: CT HEAD FINDINGS Brain: No evidence of acute infarction, hemorrhage, hydrocephalus, extra-axial collection or mass lesion/mass effect. Signs of atrophy and chronic microvascular ischemic change are unchanged since 05/20/2018. Vascular: No hyperdense vessel or unexpected calcification. Skull: Normal. Negative for fracture or focal lesion. Sinuses/Orbits: No acute finding. Other: None. CT CERVICAL SPINE FINDINGS Alignment: Degenerative changes with mild reversal of normal cervical lordosis the upper cervical spine. Alignment otherwise unremarkable and unchanged from prior. Skull base and vertebrae: No acute fracture. No primary bone lesion or focal pathologic process. Soft tissues and spinal canal: No prevertebral fluid or swelling. No visible canal hematoma. Disc levels: Multilevel degenerative changes with disc space narrowing at C2-3, C3-4, C4-5, C5-6 and C6-7. Moderate size anterior osteophytes noted at C4. Upper chest: Negative. Other: None  IMPRESSION: 1. No acute intracranial abnormality. 2. No fracture or traumatic malalignment in the cervical spine. Multilevel degenerative changes similar to prior study. Electronically Signed   By: Zetta Bills M.D.   On: 12/20/2018 12:00   Ct Cervical Spine Wo Contrast  Result Date: 12/20/2018 CLINICAL DATA:  C-spine trauma. Un witnessed presumed fall, 3 reported falls yesterday. EXAM: CT HEAD WITHOUT CONTRAST CT CERVICAL SPINE WITHOUT CONTRAST TECHNIQUE: Multidetector CT imaging of the head and cervical spine was performed following the standard protocol without intravenous contrast. Multiplanar CT image reconstructions of the cervical spine were also generated. COMPARISON:  05/20/2018 FINDINGS: CT HEAD FINDINGS Brain: No evidence of acute infarction, hemorrhage, hydrocephalus, extra-axial collection or mass lesion/mass effect. Signs of atrophy and chronic microvascular ischemic change are unchanged since 05/20/2018. Vascular: No hyperdense vessel or unexpected calcification. Skull: Normal. Negative for fracture or focal lesion. Sinuses/Orbits: No acute finding. Other: None. CT CERVICAL SPINE FINDINGS Alignment: Degenerative changes with mild reversal of normal cervical lordosis the upper cervical spine. Alignment otherwise unremarkable and unchanged from prior. Skull base and vertebrae: No acute fracture. No primary bone lesion or focal pathologic process. Soft tissues and spinal canal: No prevertebral fluid or swelling. No visible canal hematoma. Disc levels: Multilevel degenerative changes with disc space narrowing at C2-3, C3-4, C4-5, C5-6 and C6-7. Moderate size anterior osteophytes noted at C4. Upper chest: Negative. Other: None IMPRESSION: 1. No acute intracranial abnormality. 2. No fracture or traumatic malalignment in the cervical spine. Multilevel degenerative changes similar to prior study. Electronically Signed   By: Zetta Bills M.D.   On: 12/20/2018 12:00   Dg Hip Unilat W Or Wo Pelvis 2-3  Views Left  Result Date: 12/20/2018 CLINICAL DATA:  At multiple falls yesterday, reacts when moving LEFT leg, history dementia EXAM: DG HIP (WITH OR WITHOUT PELVIS) 2-3V LEFT COMPARISON:  None FINDINGS: Osseous demineralization. Hip joint spaces preserved. Question mild sclerosis and irregularity at RIGHT SI joint. Degenerative disc and facet disease changes at visualized lower lumbar spine. No acute fracture, dislocation, or bone destruction. Pelvic phleboliths and atherosclerotic calcifications noted. IMPRESSION: No acute LEFT hip abnormalities. Degenerative disc and facet disease changes of visualized lower lumbar spine. Question RIGHT sacroiliitis. Electronically Signed   By: Lavonia Dana M.D.   On: 12/20/2018 10:18  Procedures Procedures (including critical care time)  Medications Ordered in ED Medications - No data to display   Initial Impression / Assessment and Plan / ED Course  I have reviewed the triage vital signs and the nursing notes.  Pertinent labs & imaging results that were available during my care of the patient were reviewed by me and considered in my medical decision making (see chart for details).        83 year old female presents today with reported fall.  She is unable to provide any significant details surrounding the fall but otherwise looks very well.  She is laughing and playful throughout my exam, she is evasive on my questioning but appears to have no acute neurological complaints or significant injuries.  She did have tenderness on my exam on both her left anterior hip and mid thoracic region.  Will obtain imaging.    I was informed by nursing staff that they found the patient sitting on the floor in the room.  Uncertain if this was a fall, I did evaluate her again and she had no bony tenderness or signs of trauma from a fall.  I discussed patient care with [facility] and also patient's daughter [name].  He will continue outpatient management of her,  she has a follow-up appointment tomorrow.  She does live at home with her daughter, she will be discharged at this time.     Final Clinical Impressions(s) / ED Diagnoses   Final diagnoses:  Fall, initial encounter    ED Discharge Orders    None       Okey Regal, PA-C 12/20/18 1456    Maudie Flakes, MD 12/21/18 1225

## 2018-12-20 NOTE — ED Notes (Signed)
Pt found on floor by tech in a sitting position.  EDP aware and assessed pt.  No obvious injury.  Patient discharged yesterday and had similar episode happen during her stay.   Charge RN also aware.

## 2019-02-23 ENCOUNTER — Emergency Department (HOSPITAL_COMMUNITY): Payer: Medicare (Managed Care)

## 2019-02-23 ENCOUNTER — Other Ambulatory Visit: Payer: Self-pay

## 2019-02-23 ENCOUNTER — Inpatient Hospital Stay (HOSPITAL_COMMUNITY)
Admission: EM | Admit: 2019-02-23 | Discharge: 2019-02-26 | DRG: 071 | Disposition: A | Discharge status: Home / Self Care | Payer: Medicare (Managed Care) | Attending: Internal Medicine | Admitting: Internal Medicine

## 2019-02-23 DIAGNOSIS — Z9842 Cataract extraction status, left eye: Secondary | ICD-10-CM

## 2019-02-23 DIAGNOSIS — Z20822 Contact with and (suspected) exposure to covid-19: Secondary | ICD-10-CM | POA: Diagnosis present

## 2019-02-23 DIAGNOSIS — D751 Secondary polycythemia: Secondary | ICD-10-CM | POA: Diagnosis present

## 2019-02-23 DIAGNOSIS — Z9049 Acquired absence of other specified parts of digestive tract: Secondary | ICD-10-CM

## 2019-02-23 DIAGNOSIS — R531 Weakness: Secondary | ICD-10-CM | POA: Diagnosis not present

## 2019-02-23 DIAGNOSIS — Z8542 Personal history of malignant neoplasm of other parts of uterus: Secondary | ICD-10-CM

## 2019-02-23 DIAGNOSIS — G9340 Encephalopathy, unspecified: Principal | ICD-10-CM | POA: Diagnosis present

## 2019-02-23 DIAGNOSIS — Z90710 Acquired absence of both cervix and uterus: Secondary | ICD-10-CM

## 2019-02-23 DIAGNOSIS — R5383 Other fatigue: Secondary | ICD-10-CM | POA: Diagnosis present

## 2019-02-23 DIAGNOSIS — R748 Abnormal levels of other serum enzymes: Secondary | ICD-10-CM | POA: Diagnosis present

## 2019-02-23 DIAGNOSIS — Z79899 Other long term (current) drug therapy: Secondary | ICD-10-CM

## 2019-02-23 DIAGNOSIS — Z7982 Long term (current) use of aspirin: Secondary | ICD-10-CM

## 2019-02-23 DIAGNOSIS — Z9841 Cataract extraction status, right eye: Secondary | ICD-10-CM

## 2019-02-23 DIAGNOSIS — I447 Left bundle-branch block, unspecified: Secondary | ICD-10-CM | POA: Diagnosis present

## 2019-02-23 DIAGNOSIS — I110 Hypertensive heart disease with heart failure: Secondary | ICD-10-CM | POA: Diagnosis present

## 2019-02-23 DIAGNOSIS — I5032 Chronic diastolic (congestive) heart failure: Secondary | ICD-10-CM | POA: Diagnosis present

## 2019-02-23 DIAGNOSIS — E860 Dehydration: Secondary | ICD-10-CM | POA: Diagnosis present

## 2019-02-23 DIAGNOSIS — Z880 Allergy status to penicillin: Secondary | ICD-10-CM

## 2019-02-23 DIAGNOSIS — R4689 Other symptoms and signs involving appearance and behavior: Secondary | ICD-10-CM

## 2019-02-23 DIAGNOSIS — M810 Age-related osteoporosis without current pathological fracture: Secondary | ICD-10-CM | POA: Diagnosis present

## 2019-02-23 DIAGNOSIS — F0150 Vascular dementia without behavioral disturbance: Secondary | ICD-10-CM | POA: Diagnosis present

## 2019-02-23 LAB — COMPREHENSIVE METABOLIC PANEL
ALT: 17 U/L (ref 0–44)
AST: 32 U/L (ref 15–41)
Albumin: 3.7 g/dL (ref 3.5–5.0)
Alkaline Phosphatase: 79 U/L (ref 38–126)
Anion gap: 13 (ref 5–15)
BUN: 13 mg/dL (ref 8–23)
CO2: 25 mmol/L (ref 22–32)
Calcium: 8.8 mg/dL — ABNORMAL LOW (ref 8.9–10.3)
Chloride: 98 mmol/L (ref 98–111)
Creatinine, Ser: 0.99 mg/dL (ref 0.44–1.00)
GFR calc Af Amer: >60 mL/min (ref >60)
GFR calc non Af Amer: 53 mL/min — ABNORMAL LOW (ref >60)
Glucose, Bld: 142 mg/dL — ABNORMAL HIGH (ref 70–99)
Potassium: 3.7 mmol/L (ref 3.5–5.1)
Sodium: 136 mmol/L (ref 135–145)
Total Bilirubin: 1.1 mg/dL (ref 0.3–1.2)
Total Protein: 6.9 g/dL (ref 6.5–8.1)

## 2019-02-23 LAB — CBC
HCT: 48 % — ABNORMAL HIGH (ref 36.0–46.0)
Hemoglobin: 14.7 g/dL (ref 12.0–15.0)
MCH: 26.1 pg (ref 26.0–34.0)
MCHC: 30.6 g/dL (ref 30.0–36.0)
MCV: 85.1 fL (ref 80.0–100.0)
Platelets: 203 10*3/uL (ref 150–400)
RBC: 5.64 MIL/uL — ABNORMAL HIGH (ref 3.87–5.11)
RDW: 15.6 % — ABNORMAL HIGH (ref 11.5–15.5)
WBC: 7.6 10*3/uL (ref 4.0–10.5)
nRBC: 0 % (ref 0.0–0.2)

## 2019-02-23 LAB — CBG MONITORING, ED: Glucose-Capillary: 103 mg/dL — ABNORMAL HIGH (ref 70–99)

## 2019-02-23 LAB — TROPONIN I (HIGH SENSITIVITY): Troponin I (High Sensitivity): 5 ng/L (ref <18)

## 2019-02-23 LAB — LACTIC ACID, PLASMA: Lactic Acid, Venous: 2.1 mmol/L (ref 0.5–1.9)

## 2019-02-23 IMAGING — CT CT HEAD W/O CM
3 series · 15 of 47 positions shown, 18 images · non-contrast
Comparison: CT brain [DATE], [DATE], [DATE], [DATE]

CLINICAL DATA: Weakness emesis

EXAM:
CT HEAD WITHOUT CONTRAST
TECHNIQUE: Contiguous axial images were obtained from the base of the skull
through the vertex without intravenous contrast.

[Series 3: head 5.0 h30s · axial · 0.45mm/px · z∈[-80,+60]mm · 9 of 34 slices shown, 12 images]
[im 3/34  brain]
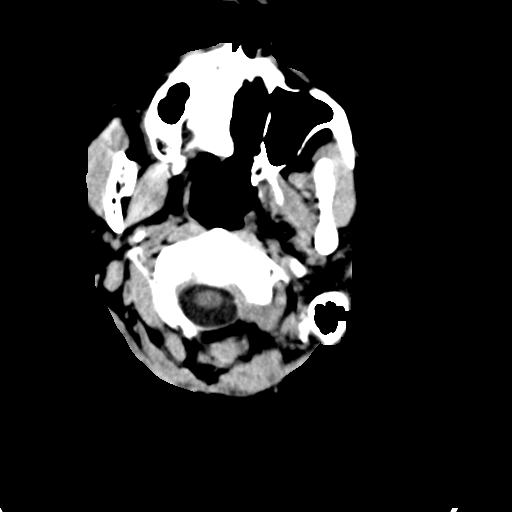
[im 3/34  bone]
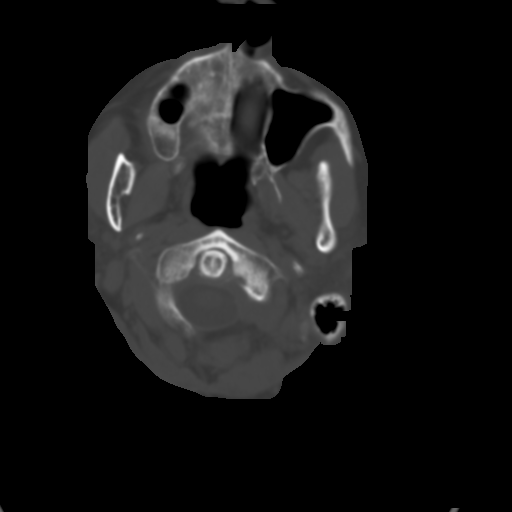
[im 6/34  brain]
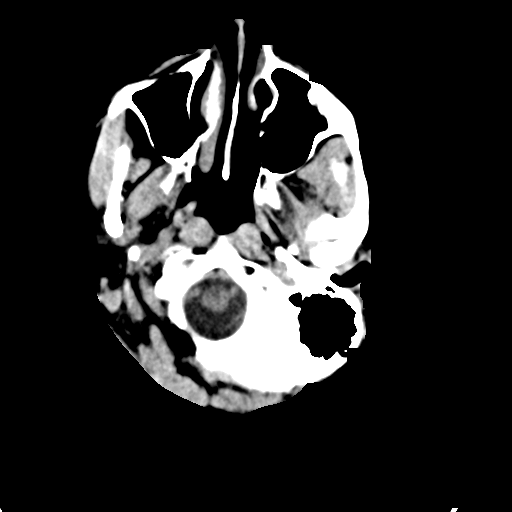
[im 10/34  brain]
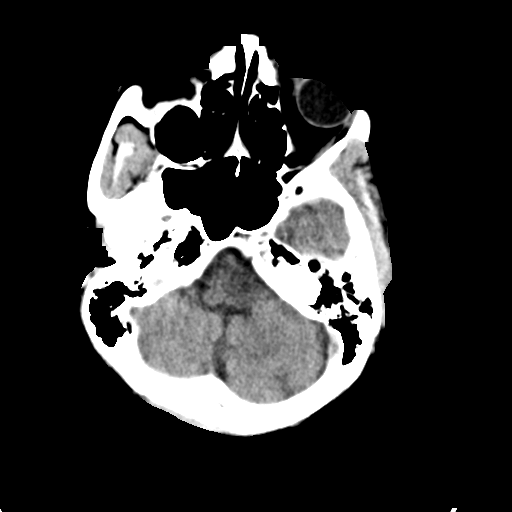
[im 13/34  brain]
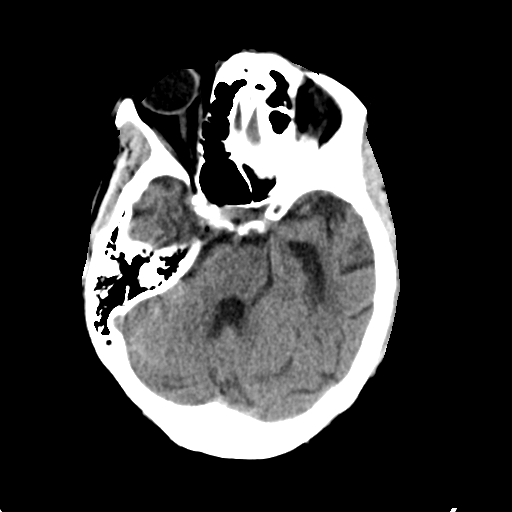
[im 18/34  brain]
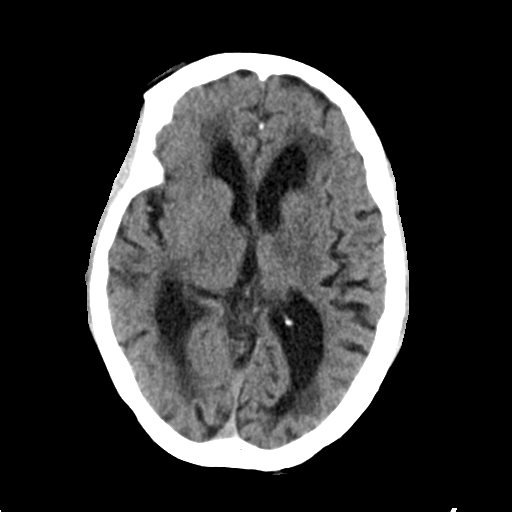
[im 18/34  bone]
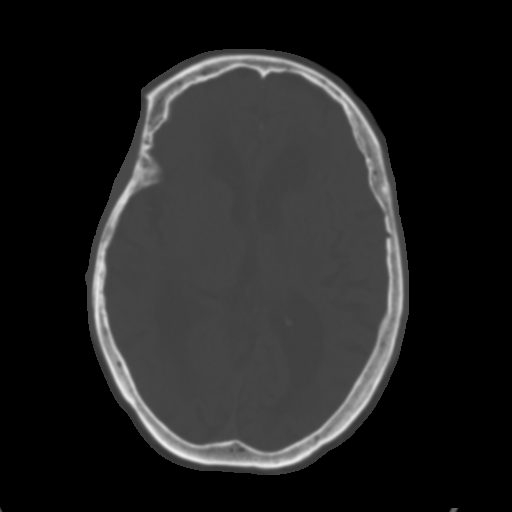
[im 21/34  brain]
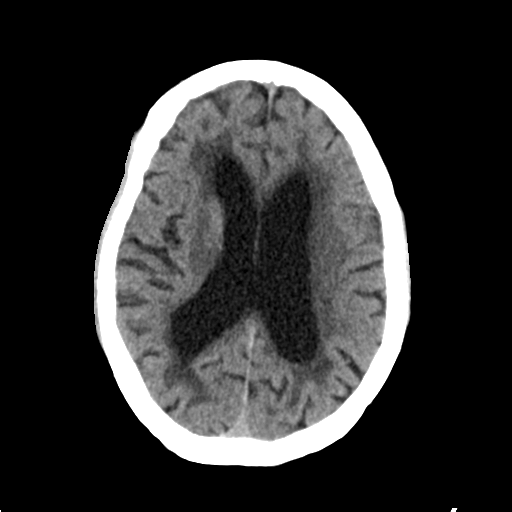
[im 24/34  brain]
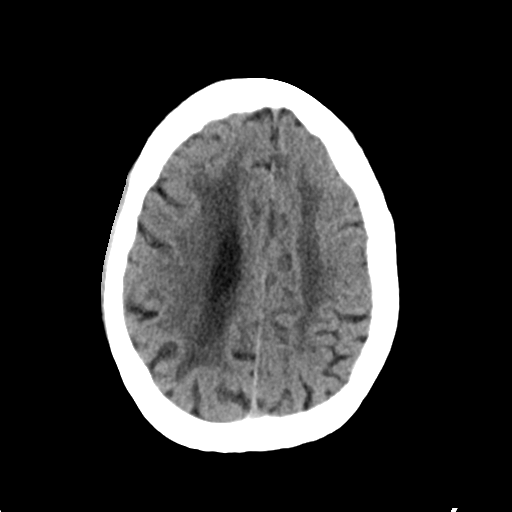
[im 28/34  brain]
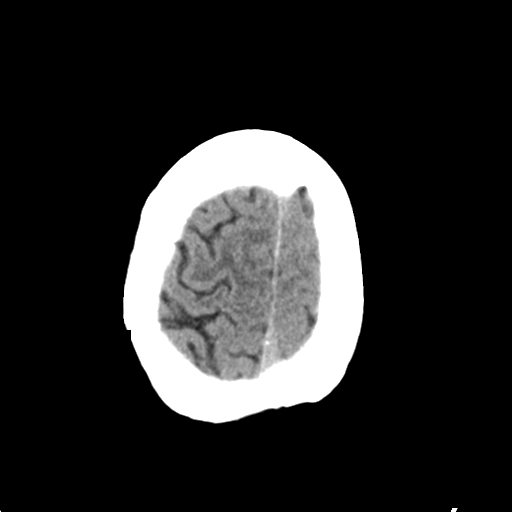
[im 31/34  brain]
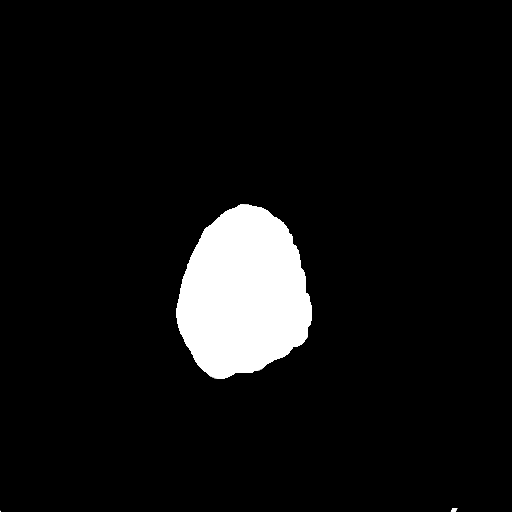
[im 31/34  bone]
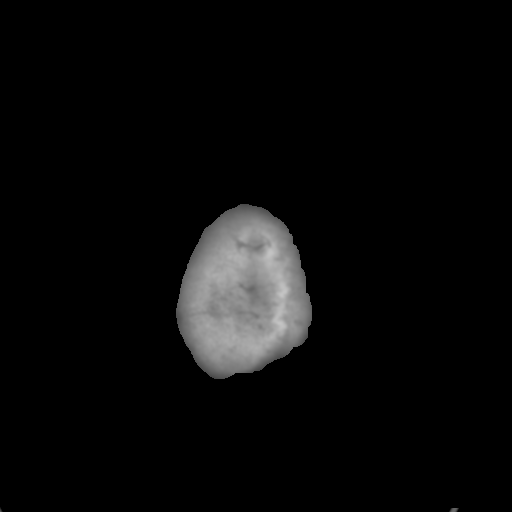

[Series 5: head 3.0 mpr cor · coronal · 0.34mm/px · 3 of 73 slices shown]
[im 25/73  brain]
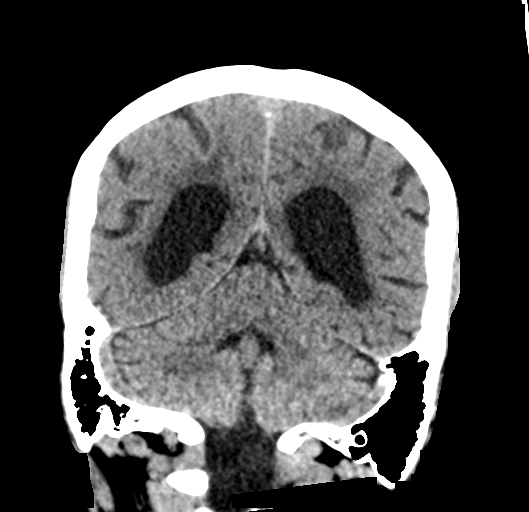
[im 33/73  brain]
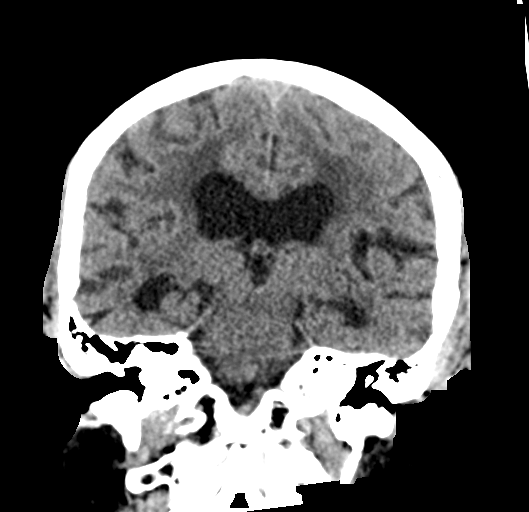
[im 41/73  brain]
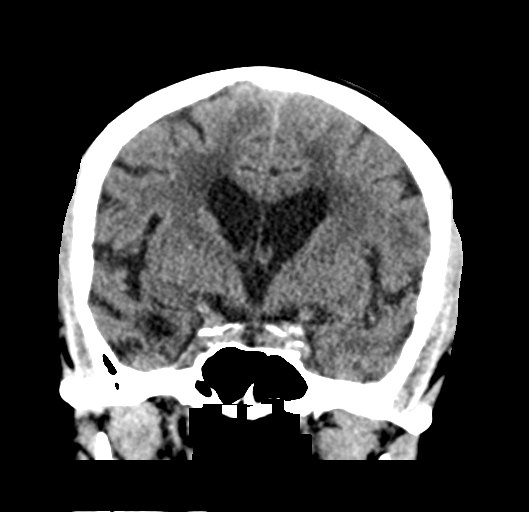

[Series 6: head 3.0 mpr sag · sagittal · 0.33mm/px · 3 of 62 slices shown]
[im 25/62  brain]
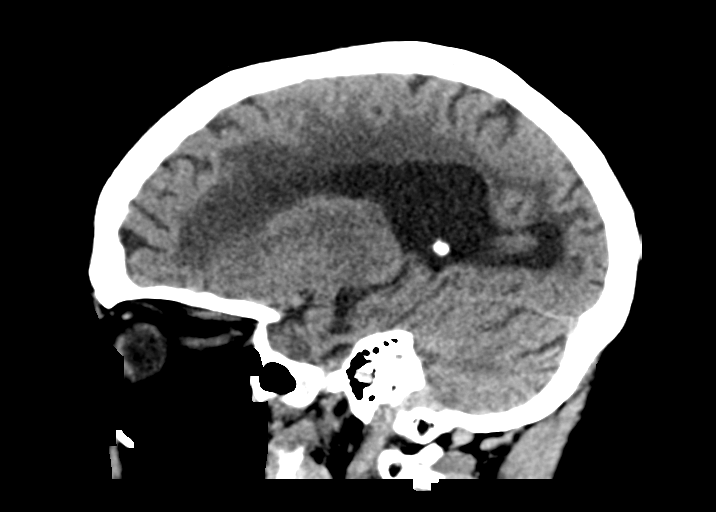
[im 31/62  brain]
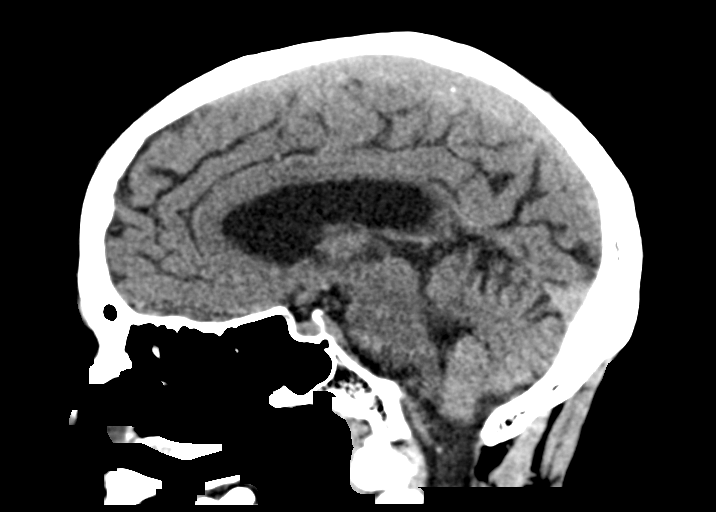
[im 38/62  brain]
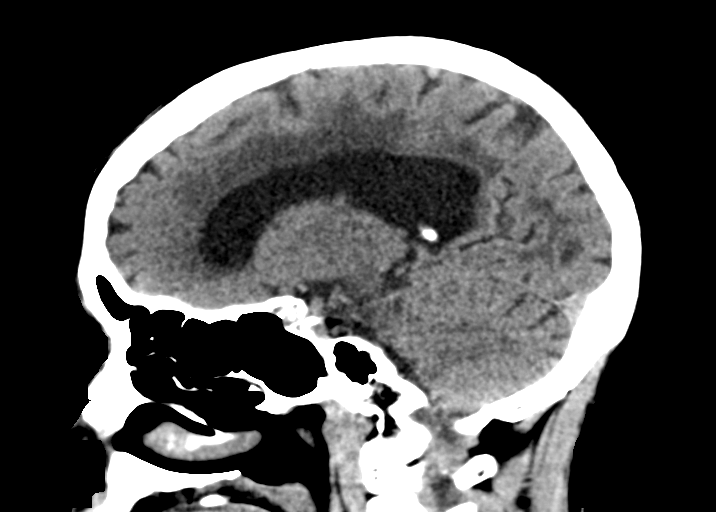

[15 of 47 positions shown; findings below may reference images not displayed]

FINDINGS: Brain: No acute territorial infarction, hemorrhage, or intracranial
mass. Atrophy and moderate to marked hypodensity in the white matter
consistent with chronic small vessel ischemic change. Stable
prominent ventricular size. Hypodensity at the medulla
intermittently visualized on comparison studies, likely artifactual.

Vascular: No hyperdense vessels.  Carotid vascular calcification

Skull: Normal. Negative for fracture or focal lesion.

Sinuses/Orbits: No acute finding. Mild mucosal thickening in the
maxillary and ethmoid sinuses

Other: None.
IMPRESSION: 1. No definite CT evidence for acute intracranial abnormality.
2. Atrophy and chronic small vessel ischemic changes of the white
matter

## 2019-02-23 IMAGING — DX DG CHEST 1V PORT
1 series · 1 of 1 positions shown · non-contrast
Comparison: Radiograph [DATE]. CT [DATE]

CLINICAL DATA: Altered behavior.

EXAM:
PORTABLE CHEST 1 VIEW

[chest]
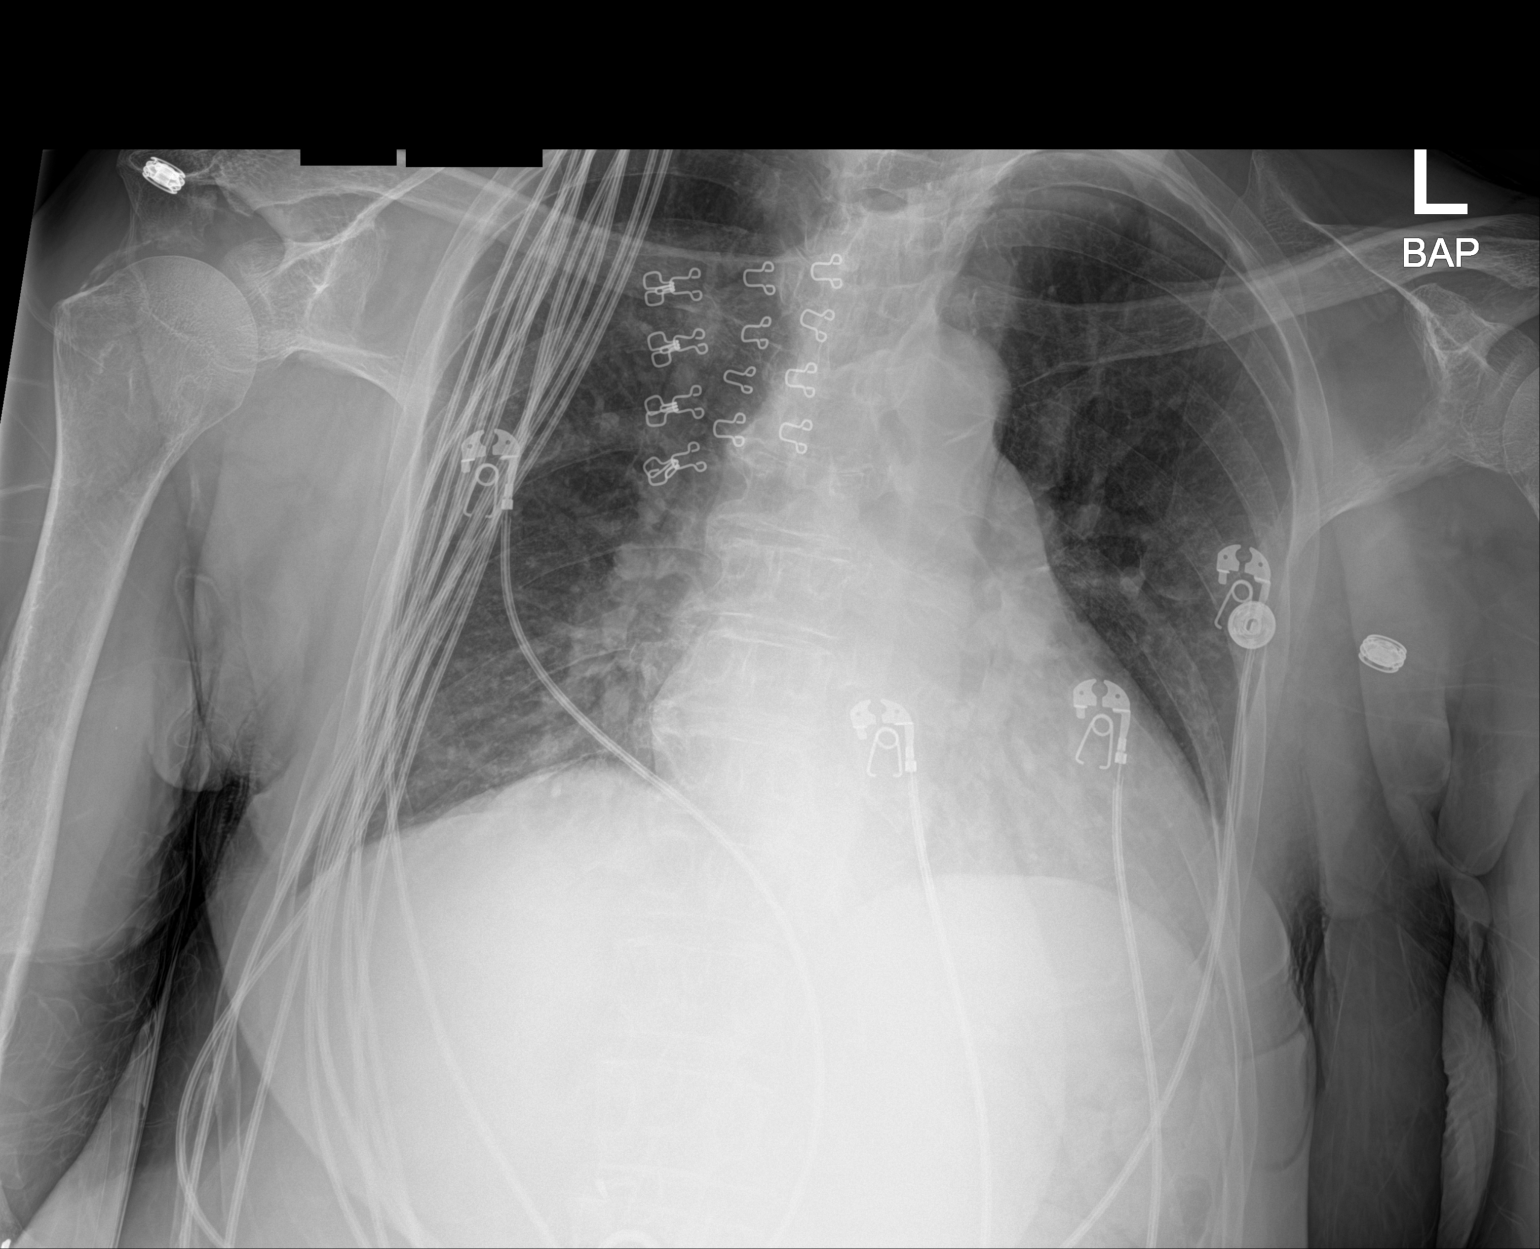

[1 of 1 positions shown; findings below may reference images not displayed]

FINDINGS: Stable mild cardiomegaly. Unchanged mediastinal contours allowing
for rotation. There multiple overlying monitoring devices the
partially limits assessment. No focal airspace disease, large
pleural effusion or pneumothorax. No acute osseous abnormalities are
seen.
IMPRESSION: Stable mild cardiomegaly. No acute abnormality.

## 2019-02-23 MED ORDER — SODIUM CHLORIDE 0.9% FLUSH: 3.0000 mL | Freq: Once | INTRAVENOUS | Status: DC

## 2019-02-23 MED ORDER — SODIUM CHLORIDE 0.9 % IV BOLUS
1000.0000 mL | Freq: Once | INTRAVENOUS | Status: AC
  Administered 2019-02-23: 1000 mL via INTRAVENOUS

## 2019-02-23 NOTE — ED Triage Notes (Signed)
Per EMS, pt from home, c/o weakness in legs starting this morning, was not able to get out of bed.  A/O X2, (self and event)per daughter Ryliegh Sevcik [id number])  Denies any dizziness, LOC, hitting head, N/V/D or pain.  Negative stroke screen.  Later told RN that she left toes hurt.

## 2019-02-23 NOTE — ED Provider Notes (Signed)
[facility] Emergency Department Provider Note MRN:  [id number]  Arrival date & time: 02/23/19     Chief Complaint   Extremity Weakness   History of Present Illness   Nicole Blackwell is a 83 y.o. year-old female with a history of uterine cancer, hypertension presenting to the ED with chief complaint of extremity weakness.  General weakness, worse in the legs for the past 1 to 2 days, difficulty ambulating, became unresponsive with vomiting in the waiting room.  I was unable to obtain an accurate HPI, PMH, or ROS due to the patient's altered mental status.  Level 5 caveat.  Review of Systems  Positive for altered mental status, vomiting, weakness.  Patient's Health History    Past Medical History:  Diagnosis Date  . BACK PAIN   . Hypertension   . OSTEOPOROSIS   . PLANTAR FASCIITIS   . Positive PPD   . SHINGLES   . SYMPTOMATIC MENOPAUSAL/FEMALE CLIMACTERIC STATES   . Syncope and collapse 12/13/2014  . Uterine cancer [facility])     Past Surgical History:  Procedure Laterality Date  . CATARACT EXTRACTION, BILATERAL Bilateral   . DILATION AND CURETTAGE OF UTERUS  1960's X 2   "I lost 2 children to miscarriage"  . LAPAROSCOPIC CHOLECYSTECTOMY    . SIGMOIDOSCOPY    . TONSILLECTOMY    . VAGINAL HYSTERECTOMY      Family History  Problem Relation Age of Onset  . Healthy Mother   . Healthy Father     Social History   Socioeconomic History  . Marital status: Divorced    Spouse name: Not on file  . Number of children: Not on file  . Years of education: Not on file  . Highest education level: Not on file  Occupational History  . Not on file  Tobacco Use  . Smoking status: Never Smoker  . Smokeless tobacco: Never Used  Substance and Sexual Activity  . Alcohol use: No  . Drug use: No  . Sexual activity: Never  Other Topics Concern  . Not on file  Social History Narrative  . Not on file   Social Determinants of Health   Financial Resource  Strain:   . Difficulty of Paying Living Expenses: Not on file  Food Insecurity:   . Worried About Charity fundraiser in the Last Year: Not on file  . Ran Out of Food in the Last Year: Not on file  Transportation Needs:   . Lack of Transportation (Medical): Not on file  . Lack of Transportation (Non-Medical): Not on file  Physical Activity:   . Days of Exercise per Week: Not on file  . Minutes of Exercise per Session: Not on file  Stress:   . Feeling of Stress : Not on file  Social Connections:   . Frequency of Communication with Friends and Family: Not on file  . Frequency of Social Gatherings with Friends and Family: Not on file  . Attends Religious Services: Not on file  . Active Member of Clubs or Organizations: Not on file  . Attends Archivist Meetings: Not on file  . Marital Status: Not on file  Intimate Partner Violence:   . Fear of Current or Ex-Partner: Not on file  . Emotionally Abused: Not on file  . Physically Abused: Not on file  . Sexually Abused: Not on file     Physical Exam  Vital Signs and Nursing Notes reviewed Vitals:   02/23/19 2135 02/23/19 2136  BP:  (!) 152/66  Pulse:  70  Resp:  16  Temp: 99.2 F (37.3 C)   SpO2:  100%    CONSTITUTIONAL: Chronically ill-appearing, NAD NEURO: Somnolent, follows commands, oriented to name only, moving all extremities EYES:  eyes equal and reactive ENT/NECK:  no LAD, no JVD CARDIO: Regular rate, well-perfused, normal S1 and S2 PULM:  CTAB no wheezing or rhonchi GI/GU:  normal bowel sounds, non-distended, non-tender MSK/SPINE:  No gross deformities, no edema SKIN:  no rash, atraumatic PSYCH:  Appropriate speech and behavior  Diagnostic and Interventional Summary    EKG Interpretation  Date/Time:  Thursday February 23 2019 21:34:54 EST Ventricular Rate:  72 PR Interval:    QRS Duration: 135 QT Interval:  440 QTC Calculation: 482 R Axis:   115 Text Interpretation: Sinus or ectopic atrial rhythm  Consider left ventricular hypertrophy ST depression, consider subendocardial injury Confirmed by Gerlene Fee [phone]) on 02/23/2019 9:48:25 PM      Labs Reviewed  COMPREHENSIVE METABOLIC PANEL - Abnormal; Notable for the following components:      Result Value   Glucose, Bld 142 (*)    Calcium 8.8 (*)    GFR calc non Af Amer 53 (*)    All other components within normal limits  CBC - Abnormal; Notable for the following components:   RBC 5.64 (*)    HCT 48.0 (*)    RDW 15.6 (*)    All other components within normal limits  LACTIC ACID, PLASMA - Abnormal; Notable for the following components:   Lactic Acid, Venous 2.1 (*)    All other components within normal limits  CBG MONITORING, ED - Abnormal; Notable for the following components:   Glucose-Capillary 103 (*)    All other components within normal limits  CULTURE, BLOOD (SINGLE)  LACTIC ACID, PLASMA  URINALYSIS, ROUTINE W REFLEX MICROSCOPIC  POC SARS CORONAVIRUS 2 AG -  ED  TROPONIN I (HIGH SENSITIVITY)  TROPONIN I (HIGH SENSITIVITY)    CT Head Wo Contrast  Final Result    XR Chest Single View  Final Result      Medications  sodium chloride flush (NS) 0.9 % injection 3 mL (has no administration in time range)  sodium chloride 0.9 % bolus 1,000 mL (has no administration in time range)     Procedures  /  Critical Care Procedures  ED Course and Medical Decision Making  I have reviewed the triage vital signs and the nursing notes.  Pertinent labs & imaging results that were available during my care of the patient were reviewed by me and considered in my medical decision making (see below for details).     Altered mental status, weakness, emesis, considering metabolic disarray, intracranial hemorrhage, EKG revealing known left bundle branch block but with some ST segment changes, patient denies chest pain but she is altered.  All of the ST segment changes are discordant and therefore do not qualify as STEMI but will  follow troponin closely.  Work-up thus far unrevealing, troponin negative, labs reassuring, still awaiting urinalysis.  Given patient's inability to ambulate for the past day, continued vomiting, elevated lactate, question of confusion, anticipating admission.  Signed out to oncoming provider at shift change.  Barth Kirks. Sedonia Small, [place] [email or web address]  Final Clinical Impressions(s) / ED Diagnoses     ICD-10-CM   1. Weakness  R53.1   2. Altered behavior  R46.89 XR Chest Single View    XR Chest Single  View    ED Discharge Orders    None       Discharge Instructions Discussed with and Provided to Patient:   Discharge Instructions   None       Maudie Flakes, MD 02/23/19 2340

## 2019-02-23 NOTE — ED Triage Notes (Signed)
Pt bib gcems with c/o difficulty walking x 1 day. In triage pt had emesis episode and then became non-verbal.

## 2019-02-24 ENCOUNTER — Encounter (HOSPITAL_COMMUNITY): Payer: Self-pay | Admitting: Family Medicine

## 2019-02-24 DIAGNOSIS — F0150 Vascular dementia without behavioral disturbance: Secondary | ICD-10-CM | POA: Diagnosis present

## 2019-02-24 DIAGNOSIS — Z8542 Personal history of malignant neoplasm of other parts of uterus: Secondary | ICD-10-CM | POA: Diagnosis not present

## 2019-02-24 DIAGNOSIS — D751 Secondary polycythemia: Secondary | ICD-10-CM | POA: Diagnosis present

## 2019-02-24 DIAGNOSIS — M810 Age-related osteoporosis without current pathological fracture: Secondary | ICD-10-CM | POA: Diagnosis present

## 2019-02-24 DIAGNOSIS — Z90710 Acquired absence of both cervix and uterus: Secondary | ICD-10-CM | POA: Diagnosis not present

## 2019-02-24 DIAGNOSIS — Z9049 Acquired absence of other specified parts of digestive tract: Secondary | ICD-10-CM | POA: Diagnosis not present

## 2019-02-24 DIAGNOSIS — Z9842 Cataract extraction status, left eye: Secondary | ICD-10-CM | POA: Diagnosis not present

## 2019-02-24 DIAGNOSIS — G9340 Encephalopathy, unspecified: Secondary | ICD-10-CM | POA: Diagnosis present

## 2019-02-24 DIAGNOSIS — R748 Abnormal levels of other serum enzymes: Secondary | ICD-10-CM | POA: Diagnosis present

## 2019-02-24 DIAGNOSIS — I5032 Chronic diastolic (congestive) heart failure: Secondary | ICD-10-CM

## 2019-02-24 DIAGNOSIS — Z79899 Other long term (current) drug therapy: Secondary | ICD-10-CM | POA: Diagnosis not present

## 2019-02-24 DIAGNOSIS — Z880 Allergy status to penicillin: Secondary | ICD-10-CM | POA: Diagnosis not present

## 2019-02-24 DIAGNOSIS — E860 Dehydration: Secondary | ICD-10-CM | POA: Diagnosis present

## 2019-02-24 DIAGNOSIS — R5383 Other fatigue: Secondary | ICD-10-CM | POA: Diagnosis present

## 2019-02-24 DIAGNOSIS — Z9841 Cataract extraction status, right eye: Secondary | ICD-10-CM | POA: Diagnosis not present

## 2019-02-24 DIAGNOSIS — R011 Cardiac murmur, unspecified: Secondary | ICD-10-CM | POA: Diagnosis not present

## 2019-02-24 DIAGNOSIS — I110 Hypertensive heart disease with heart failure: Secondary | ICD-10-CM | POA: Diagnosis present

## 2019-02-24 DIAGNOSIS — Z7982 Long term (current) use of aspirin: Secondary | ICD-10-CM | POA: Diagnosis not present

## 2019-02-24 DIAGNOSIS — R531 Weakness: Secondary | ICD-10-CM | POA: Diagnosis present

## 2019-02-24 DIAGNOSIS — I447 Left bundle-branch block, unspecified: Secondary | ICD-10-CM | POA: Diagnosis present

## 2019-02-24 DIAGNOSIS — Z20822 Contact with and (suspected) exposure to covid-19: Secondary | ICD-10-CM | POA: Diagnosis present

## 2019-02-24 LAB — URINALYSIS, ROUTINE W REFLEX MICROSCOPIC
Bacteria, UA: NONE SEEN
Bilirubin Urine: NEGATIVE
Glucose, UA: NEGATIVE mg/dL
Hgb urine dipstick: NEGATIVE
Ketones, ur: NEGATIVE mg/dL
Leukocytes,Ua: NEGATIVE
Nitrite: NEGATIVE
Protein, ur: 30 mg/dL — AB
Specific Gravity, Urine: 1.02 (ref 1.005–1.030)
pH: 7 (ref 5.0–8.0)

## 2019-02-24 LAB — CBC
HCT: 50.7 % — ABNORMAL HIGH (ref 36.0–46.0)
Hemoglobin: 15.2 g/dL — ABNORMAL HIGH (ref 12.0–15.0)
MCH: 25.6 pg — ABNORMAL LOW (ref 26.0–34.0)
MCHC: 30 g/dL (ref 30.0–36.0)
MCV: 85.4 fL (ref 80.0–100.0)
Platelets: 205 10*3/uL (ref 150–400)
RBC: 5.94 MIL/uL — ABNORMAL HIGH (ref 3.87–5.11)
RDW: 15.6 % — ABNORMAL HIGH (ref 11.5–15.5)
WBC: 6.5 10*3/uL (ref 4.0–10.5)
nRBC: 0 % (ref 0.0–0.2)

## 2019-02-24 LAB — POC SARS CORONAVIRUS 2 AG -  ED: SARS Coronavirus 2 Ag: NEGATIVE

## 2019-02-24 LAB — VITAMIN B12: Vitamin B-12: 2829 pg/mL — ABNORMAL HIGH (ref 180–914)

## 2019-02-24 LAB — LACTIC ACID, PLASMA: Lactic Acid, Venous: 1.4 mmol/L (ref 0.5–1.9)

## 2019-02-24 LAB — BASIC METABOLIC PANEL
Anion gap: 10 (ref 5–15)
BUN: 12 mg/dL (ref 8–23)
CO2: 27 mmol/L (ref 22–32)
Calcium: 8.7 mg/dL — ABNORMAL LOW (ref 8.9–10.3)
Chloride: 102 mmol/L (ref 98–111)
Creatinine, Ser: 0.91 mg/dL (ref 0.44–1.00)
GFR calc Af Amer: >60 mL/min (ref >60)
GFR calc non Af Amer: 59 mL/min — ABNORMAL LOW (ref >60)
Glucose, Bld: 104 mg/dL — ABNORMAL HIGH (ref 70–99)
Potassium: 4.1 mmol/L (ref 3.5–5.1)
Sodium: 139 mmol/L (ref 135–145)

## 2019-02-24 LAB — SARS CORONAVIRUS 2 (TAT 6-24 HRS): SARS Coronavirus 2: NEGATIVE

## 2019-02-24 LAB — TROPONIN I (HIGH SENSITIVITY): Troponin I (High Sensitivity): 4 ng/L (ref <18)

## 2019-02-24 LAB — CK: Total CK: 326 U/L — ABNORMAL HIGH (ref 38–234)

## 2019-02-24 LAB — TSH: TSH: 1.265 u[IU]/mL (ref 0.350–4.500)

## 2019-02-24 LAB — HIV ANTIBODY (ROUTINE TESTING W REFLEX): HIV Screen 4th Generation wRfx: NONREACTIVE

## 2019-02-24 LAB — RPR: RPR Ser Ql: NONREACTIVE

## 2019-02-24 MED ORDER — POLYETHYLENE GLYCOL 3350 17 G PO PACK
17.0000 g | PACK | Freq: Every day | ORAL | Status: DC | PRN
  Administered 2019-02-24: 17:00:00 17 g via ORAL
  Filled 2019-02-24: qty 1

## 2019-02-24 MED ORDER — SODIUM CHLORIDE 0.9% FLUSH
3.0000 mL | Freq: Two times a day (BID) | INTRAVENOUS | Status: DC
  Administered 2019-02-24 – 2019-02-26 (×5): 3 mL via INTRAVENOUS

## 2019-02-24 MED ORDER — ENOXAPARIN SODIUM 40 MG/0.4ML ~~LOC~~ SOLN
40.0000 mg | SUBCUTANEOUS | Status: DC
  Administered 2019-02-24 – 2019-02-26 (×3): 40 mg via SUBCUTANEOUS
  Filled 2019-02-24 (×3): qty 0.4

## 2019-02-24 MED ORDER — ONDANSETRON HCL 4 MG PO TABS: 4.0000 mg | ORAL_TABLET | Freq: Four times a day (QID) | ORAL | Status: DC | PRN

## 2019-02-24 MED ORDER — SODIUM CHLORIDE 0.9 % IV SOLN: 250.0000 mL | INTRAVENOUS | Status: DC | PRN

## 2019-02-24 MED ORDER — SODIUM CHLORIDE 0.9 % IV SOLN: 250.0000 mL | INTRAVENOUS | Status: AC | PRN

## 2019-02-24 MED ORDER — ONDANSETRON HCL 4 MG/2ML IJ SOLN: 4.0000 mg | Freq: Four times a day (QID) | INTRAMUSCULAR | Status: DC | PRN

## 2019-02-24 MED ORDER — ACETAMINOPHEN 325 MG PO TABS: 650.0000 mg | ORAL_TABLET | Freq: Four times a day (QID) | ORAL | Status: DC | PRN

## 2019-02-24 MED ORDER — SODIUM CHLORIDE 0.9% FLUSH: 3.0000 mL | Freq: Two times a day (BID) | INTRAVENOUS | Status: DC

## 2019-02-24 MED ORDER — SODIUM CHLORIDE 0.9% FLUSH: 3.0000 mL | INTRAVENOUS | Status: DC | PRN

## 2019-02-24 MED ORDER — ACETAMINOPHEN 650 MG RE SUPP: 650.0000 mg | Freq: Four times a day (QID) | RECTAL | Status: DC | PRN

## 2019-02-24 MED ORDER — LOSARTAN POTASSIUM 25 MG PO TABS
25.0000 mg | ORAL_TABLET | Freq: Every day | ORAL | Status: DC
  Administered 2019-02-25 – 2019-02-26 (×2): 25 mg via ORAL
  Filled 2019-02-24 (×2): qty 1

## 2019-02-24 NOTE — TOC Initial Note (Signed)
Transition of Care [facility]) - Initial/Assessment Note    Patient Details  Name: Nicole Blackwell MRN: [id number] Date of Birth: Jan 05, 1936  Transition of Care [facility]) CM/SW Contact:    Gelene Mink, [place] Phone Number: 02/24/2019, 1:02 PM  Clinical Narrative:                   CSW called and spoke with the patient's daughter, Zameria Vanacker. She would like to bring her mother home with home health. She stated that she has been to SNF and didn't like the way her mother was cared for. She stated that she already has a Systems analyst aid and is active with [facility].   [name] would like to use [facility]. CSW called Tiffany with [facility] and left a voicemail. CSW is awaiting a return phone call.   Patient will need home health and DME orders.   CSW will continue to follow and assist with discharge planning.   Expected Discharge Plan: [place] Barriers to Discharge: Continued Medical Work up   Patient Goals and CMS Choice Patient states their goals for this hospitalization and ongoing recovery are:: Pt daughter wants her to come home CMS Medicare.gov Compare Post Acute Care list provided to:: Patient Represenative (must comment) Choice offered to / list presented to : Adult Children  Expected Discharge Plan and Services Expected Discharge Plan: [place] In-house Referral: Clinical Social Work Discharge Planning Services: NA Post Acute Care Choice: [place] arrangements for the past 2 months: Single Family Home                 DME Arranged: Walker rolling         HH Arranged: PT, OT HH Agency: [facility] (formerly [employer]) Date [place]: 02/24/19 Time [place]: [place] Representative spoke with at [place]: [place] Arrangements/Services Living arrangements for the past 2 months: [place] Lives with:: Adult Children Patient language and need for interpreter  reviewed:: No Do you feel safe going back to the place where you live?: Yes      Need for Family Participation in Patient Care: Yes (Comment) Care giver support system in place?: Yes (comment) Current home services: DME, Homehealth aide Criminal Activity/Legal Involvement Pertinent to Current Situation/Hospitalization: No - Comment as needed  Activities of Daily Living      Permission Sought/Granted Permission sought to share information with : Case Manager Permission granted to share information with : Yes, Verbal Permission Granted     Permission granted to share info w AGENCY: [facility]        Emotional Assessment Appearance:: Appears stated age Attitude/Demeanor/Rapport: Unable to Assess Affect (typically observed): Unable to Assess Orientation: : Oriented to Self, Oriented to Place Alcohol / Substance Use: Not Applicable Psych Involvement: No (comment)  Admission diagnosis:  Weakness [R53.1] Acute encephalopathy [G93.40] Altered behavior [R46.89] Patient Active Problem List   Diagnosis Date Noted  . Acute encephalopathy 02/24/2019  . AMS (altered mental status) 12/17/2018  . Fatigue 08/27/2018  . Protein calorie malnutrition ([place]) 05/24/2018  . Hypoalbuminemia 05/23/2018  . Abnormal EEG 05/22/2018  . Dementia ([place]) 05/21/2018  . Fall 05/20/2018  . Rhabdomyolysis 05/20/2018  . Bilateral lower extremity edema 05/20/2018  . Serum total bilirubin elevated 05/20/2018  . B12 deficiency 01/17/2018  . Vascular dementia without behavioral disturbance ([place]) 01/17/2018  . Laceration of scalp without foreign body   .  Chronic diastolic CHF (congestive heart failure) ([place]) 12/13/2014  . Syncope 01/12/2012  . BACK PAIN 04/30/2008  . SHINGLES 09/27/2007  . SYMPTOMATIC MENOPAUSAL/FEMALE CLIMACTERIC STATES 06/20/2007  . Osteoporosis 03/11/2007  . PLANTAR FASCIITIS 11/16/2006   PCP:  Billie Ruddy, MD Pharmacy:   [facility] [place], [place] - [address] AT [address] [place] [address] Phone: [phone] Fax: [phone]     Social Determinants of Health (SDOH) Interventions    Readmission Risk Interventions No flowsheet data found.

## 2019-02-24 NOTE — H&P (Signed)
History and Physical    JAMILAH HAJJ [id number] DOB: [birth year] DOA: 02/23/2019  PCP: Billie Ruddy, MD   Patient coming from: Home with family   Chief Complaint: Generalized weakness, lethargy   HPI: Nicole Blackwell is a 84 y.o. female with medical history significant for chronic diastolic CHF and vascular dementia, now presenting to the emergency department for evaluation of weakness and lethargy.  Patient is unable to contribute to the history due to her clinical condition and history obtained from family report to ED personnel and review of the medical records.  At her baseline, the patient is reportedly alert and able to ambulate unassisted, and lives with family.  She was reportedly too weak in general this morning to get out of bed and has been generally weak with difficulty ambulating for at least 1 day.  There was no fall or trauma reported.  Patient had an episode of vomiting in the ED, will wake briefly to voice and answer some "yes/no" questions.  She has had episodes in the past with altered mental status and vomiting, was admitted for this in October 2020, had extensive work-up including neurology consultation, and no etiology was identified.  ED Course: Upon arrival to the ED, patient is found to be afebrile, saturating well on room air, slightly bradycardic, and with stable blood pressure.  EKG features sinus or ectopic atrial rhythm IVCD and ST-T wave abnormality.  Chest x-ray is negative for acute cardiopulmonary disease.  No acute findings on head CT.  Chemistry panel and CBC unremarkable.  Urinalysis with proteinuria.  High-sensitivity troponin normal x2.  Initial lactic acid slightly elevated.  Patient was given a liter of normal saline, lactate normalized, Covid antigen test was negative, Covid PCR is in process, patient remains lethargic, and hospitalist consulted for admission.  Review of Systems:  All other systems reviewed and apart from HPI, are negative.  Past  Medical History:  Diagnosis Date  . BACK PAIN   . Hypertension   . OSTEOPOROSIS   . PLANTAR FASCIITIS   . Positive PPD   . SHINGLES   . SYMPTOMATIC MENOPAUSAL/FEMALE CLIMACTERIC STATES   . Syncope and collapse 12/13/2014  . Uterine cancer [facility])     Past Surgical History:  Procedure Laterality Date  . CATARACT EXTRACTION, BILATERAL Bilateral   . DILATION AND CURETTAGE OF UTERUS  1960's X 2   "I lost 2 children to miscarriage"  . LAPAROSCOPIC CHOLECYSTECTOMY    . SIGMOIDOSCOPY    . TONSILLECTOMY    . VAGINAL HYSTERECTOMY       reports that she has never smoked. She has never used smokeless tobacco. She reports that she does not drink alcohol or use drugs.  Allergies  Allergen Reactions  . Penicillin G Benzathine Swelling    Did it involve swelling of the face/tongue/throat, SOB, or low BP? Unknown Did it involve sudden or severe rash/hives, skin peeling, or any reaction on the inside of your mouth or nose? Unknown Did you need to seek medical attention at a hospital or doctor's office? Unknown When did it last happen?unknown If all above answers are "NO", may proceed with cephalosporin use.  Marland Kitchen Penicillins Swelling    Did it involve swelling of the face/tongue/throat, SOB, or low BP? Unknown Did it involve sudden or severe rash/hives, skin peeling, or any reaction on the inside of your mouth or nose? Unknown Did you need to seek medical attention at a hospital or doctor's office? Unknown When did it last happen?unknown If  all above answers are "NO", may proceed with cephalosporin use.    Family History  Problem Relation Age of Onset  . Healthy Mother   . Healthy Father      Prior to Admission medications   Medication Sig Start Date End Date Taking? Authorizing Provider  acetaminophen (TYLENOL) 325 MG tablet Take 2 tablets (650 mg total) by mouth every 6 (six) hours as needed for mild pain or headache (or Fever >/= 101). 05/24/18   Norval Morton, MD    aspirin EC 81 MG tablet Take 81 mg by mouth at bedtime.    [provider]  Cholecalciferol 1.25 MG (50000 UT) capsule Take 50,000 Units by mouth every Monday.    [provider]  losartan (COZAAR) 25 MG tablet TAKE 1 TABLET(25 MG) BY MOUTH DAILY Patient taking differently: Take 25 mg by mouth daily.  09/18/18   Billie Ruddy, MD  vitamin B-12 (CYANOCOBALAMIN) 100 MCG tablet Take 100 mcg by mouth daily.    [provider]    Physical Exam: Vitals:   02/24/19 0115 02/24/19 0130 02/24/19 0348 02/24/19 0448  BP: (!) 170/65 (!) 146/63 (!) 139/58 (!) 142/65  Pulse:   (!) 57 66  Resp: 17 12 20 18   Temp:      TempSrc:      SpO2:   100% 100%    Constitutional: NAD, lethargic  Eyes: PERTLA, lids and conjunctivae normal ENMT: Mucous membranes are moist. Posterior pharynx clear of any exudate or lesions.   Neck: normal, supple, no masses, no thyromegaly Respiratory: no wheezing, no crackles. Normal respiratory effort. No accessory muscle use.  Cardiovascular: S1 & S2 heard, regular rate and rhythm. No extremity edema.  Abdomen: No distension, no tenderness, soft. Bowel sounds active.  Musculoskeletal: no clubbing / cyanosis. No joint deformity upper and lower extremities.    Skin: no significant rashes, lesions, ulcers. Warm, dry, well-perfused. Neurologic: No gross facial asymmetry, PERRL. Sensation intact. Moving all extremities sponteously. Lethargic, wakes briefly to voice and answers some yes/no questions.    Labs on Admission: I have personally reviewed following labs and imaging studies  CBC: Recent Labs  Lab 02/23/19 2150  WBC 7.6  HGB 14.7  HCT 48.0*  MCV 85.1  PLT 123456   Basic Metabolic Panel: Recent Labs  Lab 02/23/19 2150  NA 136  K 3.7  CL 98  CO2 25  GLUCOSE 142*  BUN 13  CREATININE 0.99  CALCIUM 8.8*   GFR: CrCl cannot be calculated (Unknown ideal weight.). Liver Function Tests: Recent Labs  Lab 02/23/19 2150  AST 32  ALT  17  ALKPHOS 79  BILITOT 1.1  PROT 6.9  ALBUMIN 3.7   No results for input(s): LIPASE, AMYLASE in the last 168 hours. No results for input(s): AMMONIA in the last 168 hours. Coagulation Profile: No results for input(s): INR, PROTIME in the last 168 hours. Cardiac Enzymes: No results for input(s): CKTOTAL, CKMB, CKMBINDEX, TROPONINI in the last 168 hours. BNP (last 3 results) No results for input(s): PROBNP in the last 8760 hours. HbA1C: No results for input(s): HGBA1C in the last 72 hours. CBG: Recent Labs  Lab 02/23/19 2259  GLUCAP 103*   Lipid Profile: No results for input(s): CHOL, HDL, LDLCALC, TRIG, CHOLHDL, LDLDIRECT in the last 72 hours. Thyroid Function Tests: No results for input(s): TSH, T4TOTAL, FREET4, T3FREE, THYROIDAB in the last 72 hours. Anemia Panel: No results for input(s): VITAMINB12, FOLATE, FERRITIN, TIBC, IRON, RETICCTPCT in the last 72 hours.  Urine analysis:    Component Value Date/Time   COLORURINE YELLOW 02/24/2019 0005   APPEARANCEUR CLEAR 02/24/2019 0005   LABSPEC 1.020 02/24/2019 0005   PHURINE 7.0 02/24/2019 0005   GLUCOSEU NEGATIVE 02/24/2019 0005   HGBUR NEGATIVE 02/24/2019 0005   BILIRUBINUR NEGATIVE 02/24/2019 0005   BILIRUBINUR n 01/03/2016 1048   KETONESUR NEGATIVE 02/24/2019 0005   PROTEINUR 30 (A) 02/24/2019 0005   UROBILINOGEN 2.0 (H) 08/24/2018 1936   NITRITE NEGATIVE 02/24/2019 0005   LEUKOCYTESUR NEGATIVE 02/24/2019 0005   Sepsis Labs: @LABRCNTIP (procalcitonin:4,lacticidven:4) )No results found for this or any previous visit (from the past 240 hour(s)).   Radiological Exams on Admission: CT Head Wo Contrast  Result Date: 02/23/2019 CLINICAL DATA:  Weakness emesis EXAM: CT HEAD WITHOUT CONTRAST TECHNIQUE: Contiguous axial images were obtained from the base of the skull through the vertex without intravenous contrast. COMPARISON:  CT brain 12/20/2018, 08/27/2018, 05/20/2018, 10/08/2015 FINDINGS: Brain: No acute territorial  infarction, hemorrhage, or intracranial mass. Atrophy and moderate to marked hypodensity in the white matter consistent with chronic small vessel ischemic change. Stable prominent ventricular size. Hypodensity at the medulla intermittently visualized on comparison studies, likely artifactual. Vascular: No hyperdense vessels.  Carotid vascular calcification Skull: Normal. Negative for fracture or focal lesion. Sinuses/Orbits: No acute finding. Mild mucosal thickening in the maxillary and ethmoid sinuses Other: None. IMPRESSION: 1. No definite CT evidence for acute intracranial abnormality. 2. Atrophy and chronic small vessel ischemic changes of the white matter Electronically Signed   By: Donavan Foil M.D.   On: 02/23/2019 22:28   XR Chest Single View  Result Date: 02/23/2019 CLINICAL DATA:  Altered behavior. EXAM: PORTABLE CHEST 1 VIEW COMPARISON:  Radiograph 12/17/2018. CT 10/06/2016 FINDINGS: Stable mild cardiomegaly. Unchanged mediastinal contours allowing for rotation. There multiple overlying monitoring devices the partially limits assessment. No focal airspace disease, large pleural effusion or pneumothorax. No acute osseous abnormalities are seen. IMPRESSION: Stable mild cardiomegaly. No acute abnormality. Electronically Signed   By: Keith Rake M.D.   On: 02/23/2019 22:04    EKG: Independently reviewed. Sinus or ectopic atrial rhythm, QRS 135, ST-T wave abnormality.   Assessment/Plan   1. Acute encephalopathy  - Presents with 1-2 days of weakness and lethargy, unable to get out of bed, normally alert, interactive, and ambulates unassisted  - There is no acute finding on head CT, labs featured mild lactate elevation that cleared with IVF, and UA not suggestive of infection  - She had similar presentation in October 2020, has had multiple EEG's, but etiology not identified  - Continue neuro checks, continue cardiac monitoring, check CK, TSH, ammonia, B12, and RPR, continue supportive care     2. Chronic diastolic CHF  - Appears compensated  - She was given a liter of NS in ED  - SLIV for now, monitor fluid status   3. Dementia  - She is reportedly alert, interactive, and ambulates unassisted at baseline  - Previously on Aricept, may have been discontinued in October 2020, pharmacy medication-reconciliation pending    DVT prophylaxis: Lovenox  Code Status: Full for now, patient not oriented and family could not be reached   Family Communication: Family could not be reached at time of admission  Consults called: None  Admission status: Observation     Vianne Bulls, MD [facility] Pager [phone]  If 7PM-7AM, please contact night-coverage [email or web address] Password [email or web address]  02/24/2019, 5:06 AM

## 2019-02-24 NOTE — ED Notes (Signed)
ED TO INPATIENT HANDOFF REPORT  ED Nurse Name and Phone #:  [phone]  S Name/Age/Gender Nicole Blackwell 84 y.o. female Room/Bed: 034C/034C  Code Status   Code Status: Prior  Home/SNF/Other Home Patient oriented to: self, place and situation Is this baseline? Yes   Triage Complete: Triage complete  Chief Complaint Acute encephalopathy [G93.40]  Triage Note Per EMS, pt from home, c/o weakness in legs starting this morning, was not able to get out of bed.  A/O X2, (self and event)per daughter Klohe Lovering ([phone])  Denies any dizziness, LOC, hitting head, N/V/D or pain.  Negative stroke screen.  Later told RN that she left toes hurt.  Pt bib gcems with c/o difficulty walking x 1 day. In triage pt had emesis episode and then became non-verbal.     Allergies Allergies  Allergen Reactions  . Penicillin G Benzathine Swelling    Did it involve swelling of the face/tongue/throat, SOB, or low BP? Unknown Did it involve sudden or severe rash/hives, skin peeling, or any reaction on the inside of your mouth or nose? Unknown Did you need to seek medical attention at a hospital or doctor's office? Unknown When did it last happen?unknown If all above answers are "NO", may proceed with cephalosporin use.  Marland Kitchen Penicillins Swelling    Did it involve swelling of the face/tongue/throat, SOB, or low BP? Unknown Did it involve sudden or severe rash/hives, skin peeling, or any reaction on the inside of your mouth or nose? Unknown Did you need to seek medical attention at a hospital or doctor's office? Unknown When did it last happen?unknown If all above answers are "NO", may proceed with cephalosporin use.    Level of Care/Admitting Diagnosis ED Disposition    ED Disposition Condition Comment   Admit  Hospital Area: [facility] [[id number]]  Level of Care: Telemetry Cardiac [103]  I expect the patient will be discharged within 24 hours: Yes  LOW acuity---Tx  typically complete <24 hrs---ACUTE conditions typically can be evaluated <24 hours---LABS likely to return to acceptable levels <24 hours---IS near functional baseline---EXPECTED to return to current living arrangement---NOT newly hypoxic: Does not meet criteria for 5C-Observation unit  Covid Evaluation: Asymptomatic Screening Protocol (No Symptoms)  Diagnosis: Acute encephalopathy [124580]  Admitting Physician: Vianne Bulls [[id number]]  Attending Physician: Vianne Bulls [[id number]]       B Medical/Surgery History Past Medical History:  Diagnosis Date  . BACK PAIN   . Hypertension   . OSTEOPOROSIS   . PLANTAR FASCIITIS   . Positive PPD   . SHINGLES   . SYMPTOMATIC MENOPAUSAL/FEMALE CLIMACTERIC STATES   . Syncope and collapse 12/13/2014  . Uterine cancer [facility])    Past Surgical History:  Procedure Laterality Date  . CATARACT EXTRACTION, BILATERAL Bilateral   . DILATION AND CURETTAGE OF UTERUS  1960's X 2   "I lost 2 children to miscarriage"  . LAPAROSCOPIC CHOLECYSTECTOMY    . SIGMOIDOSCOPY    . TONSILLECTOMY    . VAGINAL HYSTERECTOMY       A IV Location/Drains/Wounds Patient Lines/Drains/Airways Status   Active Line/Drains/Airways    Name:   Placement date:   Placement time:   Site:   Days:   Peripheral IV 02/23/19 Left Antecubital   02/23/19    2136    Antecubital   1          Intake/Output Last 24 hours No intake or output data in the 24 hours ending 02/24/19 0542  Labs/Imaging Results  for orders placed or performed during the hospital encounter of 02/23/19 (from the past 48 hour(s))  Comprehensive metabolic panel     Status: Abnormal   Collection Time: 02/23/19  9:50 PM  Result Value Ref Range   Sodium 136 135 - 145 mmol/L   Potassium 3.7 3.5 - 5.1 mmol/L   Chloride 98 98 - 111 mmol/L   CO2 25 22 - 32 mmol/L   Glucose, Bld 142 (H) 70 - 99 mg/dL   BUN 13 8 - 23 mg/dL   Creatinine, Ser 0.99 0.44 - 1.00 mg/dL   Calcium 8.8 (L) 8.9 - 10.3 mg/dL   Total  Protein 6.9 6.5 - 8.1 g/dL   Albumin 3.7 3.5 - 5.0 g/dL   AST 32 15 - 41 U/L   ALT 17 0 - 44 U/L   Alkaline Phosphatase 79 38 - 126 U/L   Total Bilirubin 1.1 0.3 - 1.2 mg/dL   GFR calc non Af Amer 53 (L) >60 mL/min   GFR calc Af Amer >60 >60 mL/min   Anion gap 13 5 - 15    Comment: Performed at [facility], [address] [place], [place] [address]  CBC     Status: Abnormal   Collection Time: 02/23/19  9:50 PM  Result Value Ref Range   WBC 7.6 4.0 - 10.5 K/uL   RBC 5.64 (H) 3.87 - 5.11 MIL/uL   Hemoglobin 14.7 12.0 - 15.0 g/dL   HCT 48.0 (H) 36.0 - 46.0 %   MCV 85.1 80.0 - 100.0 fL   MCH 26.1 26.0 - 34.0 pg   MCHC 30.6 30.0 - 36.0 g/dL   RDW 15.6 (H) 11.5 - 15.5 %   Platelets 203 150 - 400 K/uL   nRBC 0.0 0.0 - 0.2 %    Comment: Performed at [facility] [address], [place], [place] [address]  Troponin     Status: None   Collection Time: 02/23/19  9:50 PM  Result Value Ref Range   Troponin I (High Sensitivity) 5 <18 ng/L    Comment: (NOTE) Elevated high sensitivity troponin I (hsTnI) values and significant  changes across serial measurements may suggest ACS but many other  chronic and acute conditions are known to elevate hsTnI results.  Refer to the "Links" section for chest pain algorithms and additional  guidance. Performed at [facility], [place] [address], [place], [place] [address]   Lactic acid, plasma     Status: Abnormal   Collection Time: 02/23/19  9:51 PM  Result Value Ref Range   Lactic Acid, Venous 2.1 (HH) 0.5 - 1.9 mmol/L    Comment: CRITICAL RESULT CALLED TO, READ BACK BY AND VERIFIED WITH:  [facility] 02/23/19 2223 WAYK Performed at [facility], [place] [address], [place], [place] [address]   CBG monitoring, ED     Status: Abnormal   Collection Time: 02/23/19 10:59 PM  Result Value Ref Range   Glucose-Capillary 103 (H) 70 - 99 mg/dL   Comment 1 Document in Chart   Urinalysis, Routine w reflex microscopic     Status:  Abnormal   Collection Time: 02/24/19 12:05 AM  Result Value Ref Range   Color, Urine YELLOW YELLOW   APPearance CLEAR CLEAR   Specific Gravity, Urine 1.020 1.005 - 1.030   pH 7.0 5.0 - 8.0   Glucose, UA NEGATIVE NEGATIVE mg/dL   Hgb urine dipstick NEGATIVE NEGATIVE   Bilirubin Urine NEGATIVE NEGATIVE   Ketones,  ur NEGATIVE NEGATIVE mg/dL   Protein, ur 30 (A) NEGATIVE mg/dL   Nitrite NEGATIVE NEGATIVE   Leukocytes,Ua NEGATIVE NEGATIVE   RBC / HPF 6-10 0 - 5 RBC/hpf   WBC, UA 0-5 0 - 5 WBC/hpf   Bacteria, UA NONE SEEN NONE SEEN   Mucus PRESENT     Comment: Performed at [facility] [address], [place], [place] [address]  POC SARS Coronavirus 2 Ag-ED - Nasal Swab (BD Veritor Kit)     Status: None   Collection Time: 02/24/19 12:17 AM  Result Value Ref Range   SARS Coronavirus 2 Ag NEGATIVE NEGATIVE    Comment: (NOTE) SARS-CoV-2 antigen NOT DETECTED.  Negative results are presumptive.  Negative results do not preclude SARS-CoV-2 infection and should not be used as the sole basis for treatment or other patient management decisions, including infection  control decisions, particularly in the presence of clinical signs and  symptoms consistent with COVID-19, or in those who have been in contact with the virus.  Negative results must be combined with clinical observations, patient history, and epidemiological information. The expected result is Negative. Fact Sheet for Patients: PodPark.tn Fact Sheet for Healthcare Providers: GiftContent.is This test is not yet approved or cleared by the [place] FDA and  has been authorized for detection and/or diagnosis of SARS-CoV-2 by FDA under an Emergency Use Authorization (EUA).  This EUA will remain in effect (meaning this test can be used) for the duration of  the COVID-19 de claration under Section 564(b)(1) of the Act, 21 U.S.C. section 360bbb-3(b)(1), unless the  authorization is terminated or revoked sooner.   Lactic acid, plasma     Status: None   Collection Time: 02/24/19  1:52 AM  Result Value Ref Range   Lactic Acid, Venous 1.4 0.5 - 1.9 mmol/L    Comment: Performed at [place] [address], [place], [place] [address]  Troponin     Status: None   Collection Time: 02/24/19  1:53 AM  Result Value Ref Range   Troponin I (High Sensitivity) 4 <18 ng/L    Comment: (NOTE) Elevated high sensitivity troponin I (hsTnI) values and significant  changes across serial measurements may suggest ACS but many other  chronic and acute conditions are known to elevate hsTnI results.  Refer to the "Links" section for chest pain algorithms and additional  guidance. Performed at [facility], [place] [address], [place], [place] [address]    CT Head Wo Contrast  Result Date: 02/23/2019 CLINICAL DATA:  Weakness emesis EXAM: CT HEAD WITHOUT CONTRAST TECHNIQUE: Contiguous axial images were obtained from the base of the skull through the vertex without intravenous contrast. COMPARISON:  CT brain 12/20/2018, 08/27/2018, 05/20/2018, 10/08/2015 FINDINGS: Brain: No acute territorial infarction, hemorrhage, or intracranial mass. Atrophy and moderate to marked hypodensity in the white matter consistent with chronic small vessel ischemic change. Stable prominent ventricular size. Hypodensity at the medulla intermittently visualized on comparison studies, likely artifactual. Vascular: No hyperdense vessels.  Carotid vascular calcification Skull: Normal. Negative for fracture or focal lesion. Sinuses/Orbits: No acute finding. Mild mucosal thickening in the maxillary and ethmoid sinuses Other: None. IMPRESSION: 1. No definite CT evidence for acute intracranial abnormality. 2. Atrophy and chronic small vessel ischemic changes of the white matter Electronically Signed   By: Donavan Foil M.D.   On: 02/23/2019 22:28   XR Chest Single View  Result Date:  02/23/2019 CLINICAL DATA:  Altered behavior. EXAM: PORTABLE CHEST 1 VIEW  COMPARISON:  Radiograph 12/17/2018. CT 10/06/2016 FINDINGS: Stable mild cardiomegaly. Unchanged mediastinal contours allowing for rotation. There multiple overlying monitoring devices the partially limits assessment. No focal airspace disease, large pleural effusion or pneumothorax. No acute osseous abnormalities are seen. IMPRESSION: Stable mild cardiomegaly. No acute abnormality. Electronically Signed   By: Keith Rake M.D.   On: 02/23/2019 22:04    Pending Labs Unresulted Labs (From admission, onward)    Start     Ordered   02/24/19 6195  Basic metabolic panel  Tomorrow morning,   R     02/24/19 0530   02/24/19 0531  CBC  Tomorrow morning,   R     02/24/19 0530   02/24/19 0531  TSH  Tomorrow morning,   R     02/24/19 0530   02/24/19 0531  CK  Tomorrow morning,   R     02/24/19 0530   02/24/19 0531  Vitamin B12  Tomorrow morning,   R     02/24/19 0530   02/24/19 0531  Folate RBC  Tomorrow morning,   R     02/24/19 0530   02/24/19 0531  RPR  Tomorrow morning,   R     02/24/19 0530   02/24/19 0531  HIV Antibody (routine testing w rflx)  (HIV Antibody (Routine testing w reflex) panel)  Once,   STAT     02/24/19 0530   02/24/19 0230  SARS CORONAVIRUS 2 (TAT 6-24 HRS) Nasopharyngeal Nasopharyngeal Swab  (Tier 3 (TAT 6-24 hrs))  Once,   STAT    Question Answer Comment  Is this test for diagnosis or screening Screening   Symptomatic for COVID-19 as defined by CDC No   Hospitalized for COVID-19 No   Admitted to ICU for COVID-19 No   Previously tested for COVID-19 Yes   Resident in a congregate (group) care setting No   Employed in healthcare setting No   Pregnant No      02/24/19 0229   02/23/19 2140  Blood Culture x 1  ONCE - STAT,   STAT     02/23/19 2139   Signed and Held  Creatinine, serum  (enoxaparin (LOVENOX)    CrCl >/= 30 ml/min)  Weekly,   R    Comments: while on enoxaparin therapy    Signed and  Held          Vitals/Pain Today's Vitals   02/24/19 0130 02/24/19 0348 02/24/19 0448 02/24/19 0519  BP: (!) 146/63 (!) 139/58 (!) 142/65 (!) 139/58  Pulse:  (!) 57 66 63  Resp: _0 Temp:      TempSrc:      SpO2:  100% 100% 100%  PainSc:        Isolation Precautions No active isolations  Medications Medications  sodium chloride flush (NS) 0.9 % injection 3 mL (3 mLs Intravenous Not Given 02/23/19 2351)  enoxaparin (LOVENOX) injection 40 mg (has no administration in time range)  0.9 %  sodium chloride infusion (has no administration in time range)  acetaminophen (TYLENOL) tablet 650 mg (has no administration in time range)    Or  acetaminophen (TYLENOL) suppository 650 mg (has no administration in time range)  ondansetron (ZOFRAN) tablet 4 mg (has no administration in time range)    Or  ondansetron (ZOFRAN) injection 4 mg (has no administration in time range)  sodium chloride 0.9 % bolus 1,000 mL (0 mLs Intravenous Stopped 02/24/19 0144)    Mobility non-ambulatory High fall risk  Focused Assessments    R Recommendations: See Admitting Provider Note  Report given to:   Additional Notes:

## 2019-02-24 NOTE — Progress Notes (Addendum)
Pt admitted after midnight but care began before midnight  84 yo admitted with acute encephalopathy, generalized weakness. CT head negative, mild lactate elevation resolved with 1 liter of fluids, no s/sx infection, CK mildly elevated. Alert oriented to self and place this am. Evaluated by PT who recommend snf/24 hour supervision. She is [facility] so IM will pick up tomorrow. Attempted to call daughter for update and no answer and no way to leave message  A/P 1. Acute encephalopathy  Presented 12/31 with 1-2 days of weakness and lethargy, unable to get out of bed, normally alert, interactive, and ambulates unassisted. Improved this am. More alert. Oriented to self and place. There is no acute finding on head CT, labs featured mild lactate elevation that cleared with IVF, and UA not suggestive of infection. No events on tele and neuro exam within limits of normal. Of note,  similar presentation in October 2020, has had multiple EEG's, but etiology not identified. -Follow  TSH, ammonia, B12, and RPR, continue supportive care    2. Chronic diastolic CHF  Appears compensated. She was given a liter of NS in ED -gentle IV fluids -CK in am  3. Dementia  She is reportedly alert, interactive, and ambulates unassisted at baseline. Previously on Aricept, may have been discontinued in October 2020, pharmacy medication-reconciliation pending    Nicole Carrel, NP   Patient is with [facility].  Plan to transfer to IMTS in AM.  Have placed TOC consult for transition back home vs SNF Patient's baseline is reported to be alert/ambulatory-- currently will awaken but falls back asleep Nicole Blackwell

## 2019-02-24 NOTE — Evaluation (Signed)
Physical Therapy Evaluation Patient Details Name: Nicole Blackwell MRN: [id number] DOB: 1935-05-24 Today's Date: 02/24/2019   History of Present Illness  84yo female presenting with weakness and lethargy, recent workup in October 2020 for similar presentation/AMS during which she had extensive workout but with no findings of causative etiology. CXR and head CT negative, labs unremarkable. PMH HTN, planar fasciitis, hx syncope, CA, vascular dementia, CHF  Clinical Impression   Patient received in bed, A&Ox2 and cooperative with therapy; stated she lives with her daughter and husband in a home with "a few steps" to enter and is able to walk without anything, unsure of how accurate this information is, family not present to confirm or clarify. Able to complete bed mobility with min guard and extended time, then required ModA to boost to upright with RW as well as MaxA/Max cues to gain balance, tolerated gait training approximately 78f with RW and MaxA for balance, safety, and sequencing and demonstrated very strong posterior lean during all upright mobility. She was left in bed with all needs met, bed alarm active and lab staff and RN present and attending. Currently recommend SNF and 24/7A moving forward.  Patient suffers from poor balance and severe gait impairment which impairs their ability to perform daily activities like safely ambulate independently in the home.  A walker alone will not resolve the issues with performing activities of daily living. A wheelchair will allow patient to safely perform daily activities.  The patient can self propel in the home or has a caregiver who can provide assistance.        Follow Up Recommendations SNF;Supervision/Assistance - 24 hour    Equipment Recommendations  Rolling walker with 5" wheels;3in1 (PT);Wheelchair (measurements PT);Wheelchair cushion (measurements PT)    Recommendations for Other Services       Precautions / Restrictions  Precautions Precautions: Fall;Other (comment) Precaution Comments: tends to have posterior LOB, sits early and quickly Restrictions Weight Bearing Restrictions: No      Mobility  Bed Mobility Overal bed mobility: Needs Assistance Bed Mobility: Supine to Sit;Sit to Supine     Supine to sit: Min guard Sit to supine: Min guard   General bed mobility comments: min guard for safety, mild posterior lean but able to maintain upright and prevent LOB with min VC  Transfers Overall transfer level: Needs assistance Equipment used: Rolling walker (2 wheeled) Transfers: Sit to/from Stand Sit to Stand: Mod assist         General transfer comment: ModA to boost to full standing position, posterior lean requiring MaxA to maintain upright initially; max cues for anterior weight shift after which patient was able to maintain upright with MinA and RW  Ambulation/Gait Ambulation/Gait assistance: Max assist Gait Distance (Feet): 8 Feet Assistive device: Rolling walker (2 wheeled) Gait Pattern/deviations: Step-to pattern;Decreased step length - right;Decreased step length - left;Decreased dorsiflexion - right;Decreased dorsiflexion - left;Decreased weight shift to right;Decreased weight shift to left;Shuffle;Leaning posteriorly;Narrow base of support Gait velocity: decreased   General Gait Details: shuffling gait pattern with strong posterior lean, Max cues and MaxA to maintain upright and faciltiate gait today; she tried to sit too early and required Max intervention to prevent fall. Required constant cues to improve step length and for initiation of stepping pattern with gait  Stairs            Wheelchair Mobility    Modified Rankin (Stroke Patients Only)       Balance Overall balance assessment: Needs assistance Sitting-balance support: Bilateral upper extremity  supported;Feet supported Sitting balance-Leahy Scale: Fair Sitting balance - Comments: mild posterior lean, able to  correct with min guard and Min VC Postural control: Posterior lean Standing balance support: Bilateral upper extremity supported;During functional activity Standing balance-Leahy Scale: Poor Standing balance comment: reliant on external support and RW                             Pertinent Vitals/Pain Pain Assessment: Faces Pain Score: 0-No pain Faces Pain Scale: No hurt Pain Intervention(s): Limited activity within patient's tolerance;Monitored during session    Home Living                   Additional Comments: Poor historian with baseline dementia. Pt reporting she lives with her daughter and husband. Chart review state pt lives alone    Prior Function Level of Independence: Needs assistance   Gait / Transfers Assistance Needed: Reports Mod I without device  ADL's / Homemaking Assistance Needed: Reports doing ADLs without assist?  Comments: Poor historian with baseline dementia. No family present to provide information. Pt reports daughter lives with her, member [facility]. Not sure of accuracy.     Hand Dominance   Dominant Hand: Right    Extremity/Trunk Assessment   Upper Extremity Assessment Upper Extremity Assessment: Generalized weakness    Lower Extremity Assessment Lower Extremity Assessment: Generalized weakness    Cervical / Trunk Assessment Cervical / Trunk Assessment: Normal  Communication   Communication: No difficulties  Cognition Arousal/Alertness: Awake/alert Behavior During Therapy: Flat affect Overall Cognitive Status: No family/caregiver present to determine baseline cognitive functioning Area of Impairment: Orientation;Attention;Memory;Following commands;Safety/judgement;Awareness;Problem solving                 Orientation Level: Disoriented to;Situation;Time Current Attention Level: Focused Memory: Decreased recall of precautions;Decreased short-term memory Following Commands: Follows one step commands  consistently;Follows one step commands with increased time Safety/Judgement: Decreased awareness of safety;Decreased awareness of deficits Awareness: Intellectual Problem Solving: Slow processing;Decreased initiation;Difficulty sequencing;Requires verbal cues;Requires tactile cues General Comments: Max VC and TC for sequencing with transfers, able to follow commands with extended time and slow processing, poor attention and carryover of cues      General Comments General comments (skin integrity, edema, etc.): VSS, RN present and observed session    Exercises     Assessment/Plan    PT Assessment Patient needs continued PT services  PT Problem List Decreased strength;Decreased cognition;Decreased knowledge of use of DME;Decreased safety awareness;Decreased balance;Decreased mobility;Decreased coordination       PT Treatment Interventions DME instruction;Balance training;Gait training;Neuromuscular re-education;Stair training;Functional mobility training;Cognitive remediation;Patient/family education;Therapeutic activities;Therapeutic exercise    PT Goals (Current goals can be found in the Care Plan section)  Acute Rehab PT Goals PT Goal Formulation: Patient unable to participate in goal setting Time For Goal Achievement: 03/10/19 Potential to Achieve Goals: Fair    Frequency Min 3X/week   Barriers to discharge        Co-evaluation               AM-PAC PT "6 Clicks" Mobility  Outcome Measure Help needed turning from your back to your side while in a flat bed without using bedrails?: A Little Help needed moving from lying on your back to sitting on the side of a flat bed without using bedrails?: A Little Help needed moving to and from a bed to a chair (including a wheelchair)?: A Lot Help needed standing up from a chair using your  arms (e.g., wheelchair or bedside chair)?: A Lot Help needed to walk in hospital room?: A Lot Help needed climbing 3-5 steps with a railing? :  Total 6 Click Score: 13    End of Session Equipment Utilized During Treatment: Gait belt Activity Tolerance: Patient tolerated treatment well Patient left: in bed;with call bell/phone within reach;with bed alarm set;Other (comment)(RN and lab staff present and attending) Nurse Communication: Mobility status PT Visit Diagnosis: Unsteadiness on feet (R26.81);Difficulty in walking, not elsewhere classified (R26.2);Muscle weakness (generalized) (M62.81)    Time: 5520-8022 PT Time Calculation (min) (ACUTE ONLY): 19 min   Charges:   PT Evaluation $PT Eval Moderate Complexity: 1 Mod          Windell Norfolk, DPT, PN1   Supplemental Physical Therapist [place]    Pager [phone] Acute Rehab Office [phone]

## 2019-02-25 LAB — CBC
HCT: 51.3 % — ABNORMAL HIGH (ref 36.0–46.0)
Hemoglobin: 15.8 g/dL — ABNORMAL HIGH (ref 12.0–15.0)
MCH: 25.8 pg — ABNORMAL LOW (ref 26.0–34.0)
MCHC: 30.8 g/dL (ref 30.0–36.0)
MCV: 83.8 fL (ref 80.0–100.0)
Platelets: 205 10*3/uL (ref 150–400)
RBC: 6.12 MIL/uL — ABNORMAL HIGH (ref 3.87–5.11)
RDW: 15.5 % (ref 11.5–15.5)
WBC: 4.8 10*3/uL (ref 4.0–10.5)
nRBC: 0 % (ref 0.0–0.2)

## 2019-02-25 LAB — CK: Total CK: 283 U/L — ABNORMAL HIGH (ref 38–234)

## 2019-02-25 MED ORDER — HYDRALAZINE HCL 20 MG/ML IJ SOLN: 2.0000 mg | Freq: Three times a day (TID) | INTRAMUSCULAR | Status: DC | PRN

## 2019-02-25 MED ORDER — HYDRALAZINE HCL 20 MG/ML IJ SOLN: 5.0000 mg | Freq: Four times a day (QID) | INTRAMUSCULAR | Status: DC | PRN

## 2019-02-25 MED ORDER — SODIUM CHLORIDE 0.9 % IV BOLUS
500.0000 mL | Freq: Once | INTRAVENOUS | Status: AC
  Administered 2019-02-25: 14:00:00 500 mL via INTRAVENOUS

## 2019-02-25 MED ORDER — HYDRALAZINE HCL 20 MG/ML IJ SOLN: 2.0000 mg | Freq: Four times a day (QID) | INTRAMUSCULAR | Status: DC | PRN

## 2019-02-25 NOTE — Progress Notes (Signed)
   Subjective: HD#1   Overnight: No acute events reported  Today, Nicole Blackwell was examined at bedside and states she is doing well.  She denies headaches, dizziness, nausea, vomiting, chest pain, shortness of breath, abdominal pain.  Objective:  Vital signs in last 24 hours: Vitals:   02/24/19 1648 02/24/19 2000 02/24/19 2052 02/25/19 0433  BP: (!) 158/63  (!) 144/77 (!) 181/72  Pulse: 72  60 (!) 57  Resp: 13 15 18    Temp: 98.1 F (36.7 C)  98.5 F (36.9 C) 97.6 F (36.4 C)  TempSrc: Oral  Oral Oral  SpO2: 100%  100% 100%  Weight:    66.7 kg   Const: In no apparent distress, lying comfortably in bed, able to carry on a conversation, alert and oriented to name, date of birth but not aware of why she was admitted to the hospital Resp: CTA BL, no wheezes, crackles, rhonchi CV: RRR, no murmurs, gallop, rub Abd: Bowel sounds present, nondistended, nontender to palpation   Assessment/Plan:  Principal Problem:   Acute encephalopathy Active Problems:   Chronic diastolic CHF (congestive heart failure) (HCC)   Vascular dementia without behavioral disturbance (HCC)   Lethargy  Nicole Blackwell is an 84 year old woman with medical history significant for vascular dementia, chronic diastolic heart failure here for evaluation of 1 to 2-day history of acute encephalopathy, lethargy and weakness   #Acute encephalopathy-improving So far her infectious work-up has been unremarkable.  She does not have any metabolic derangements.  CT head performed in the ED showed no acute intracranial abnormalities and only revealed atrophy and chronic small vessel ischemic changes of the white matter.  Today, she is alert, conversational however not oriented to why she was admitted to the hospital. -Likely discharge home with home health as daughter reports she would not like her mother to go to a skilled nursing facility given prior bad experience.  She is a paced patient and also has private duty nursing  home   #Chronic diastolic heart failure -Appears euvolemic -Continue to monitor   #Hypertension BP this a.m. in the 180s/70s.  Previously in the 140s/70s. -Resumed home losartan 25 mg daily   #Vascular dementia Per collateral information, she is alert, interactive and ambulatory unassisted at baseline.  She was also on Aricept which was discontinued in October 2020   #Polycythemia Found to have elevated hemoglobin of 15.5 with hematocrit of 50.7.  Likely combination of dehydration versus continued vitamin B12 supplementation.  She has no smoking history and denies headaches, dizziness. -Continue to monitor   #Chronic elevated CK -Downtrending with IVF    FEN: Heart healthy diet VTE ppx: Lovenox CODE STATUS: Full code  Dispo: Anticipated discharge in approximately 1 day(s).   Jean Rosenthal, MD 02/25/2019, 6:44 AM Pager: [phone] Internal Medicine Teaching Service

## 2019-02-26 DIAGNOSIS — Z880 Allergy status to penicillin: Secondary | ICD-10-CM

## 2019-02-26 DIAGNOSIS — Z79899 Other long term (current) drug therapy: Secondary | ICD-10-CM

## 2019-02-26 DIAGNOSIS — R011 Cardiac murmur, unspecified: Secondary | ICD-10-CM

## 2019-02-26 DIAGNOSIS — R748 Abnormal levels of other serum enzymes: Secondary | ICD-10-CM

## 2019-02-26 DIAGNOSIS — I110 Hypertensive heart disease with heart failure: Secondary | ICD-10-CM

## 2019-02-26 DIAGNOSIS — D751 Secondary polycythemia: Secondary | ICD-10-CM

## 2019-02-26 LAB — BASIC METABOLIC PANEL
Anion gap: 10 (ref 5–15)
BUN: 9 mg/dL (ref 8–23)
CO2: 25 mmol/L (ref 22–32)
Calcium: 8.9 mg/dL (ref 8.9–10.3)
Chloride: 100 mmol/L (ref 98–111)
Creatinine, Ser: 0.72 mg/dL (ref 0.44–1.00)
GFR calc Af Amer: >60 mL/min (ref >60)
GFR calc non Af Amer: >60 mL/min (ref >60)
Glucose, Bld: 89 mg/dL (ref 70–99)
Potassium: 4.4 mmol/L (ref 3.5–5.1)
Sodium: 135 mmol/L (ref 135–145)

## 2019-02-26 LAB — CBC
HCT: 49.4 % — ABNORMAL HIGH (ref 36.0–46.0)
Hemoglobin: 15.5 g/dL — ABNORMAL HIGH (ref 12.0–15.0)
MCH: 26.1 pg (ref 26.0–34.0)
MCHC: 31.4 g/dL (ref 30.0–36.0)
MCV: 83.2 fL (ref 80.0–100.0)
Platelets: 213 10*3/uL (ref 150–400)
RBC: 5.94 MIL/uL — ABNORMAL HIGH (ref 3.87–5.11)
RDW: 15.3 % (ref 11.5–15.5)
WBC: 4.5 10*3/uL (ref 4.0–10.5)
nRBC: 0 % (ref 0.0–0.2)

## 2019-02-26 LAB — FOLATE RBC
Folate, Hemolysate: 268 ng/mL
Folate, RBC: 567 ng/mL (ref >498)
Hematocrit: 47.3 % — ABNORMAL HIGH (ref 34.0–46.6)

## 2019-02-26 NOTE — Discharge Summary (Signed)
Name: Nicole Blackwell MRN: [id number] DOB: [birth year] 84 y.o. PCP: Billie Ruddy, MD  Date of Admission: 02/23/2019  9:07 PM Date of Discharge:  02/25/2018 Attending Physician: Lucious Groves, DO  Discharge Diagnosis: 1.  Acute encephalopathy 2.  Chronic diastolic heart failure 3.  Hypertension 4.  Vascular dementia 5.  Polycythemia 6.  Chronic CK elevation  Discharge Medications: Allergies as of 02/26/2019      Reactions   Penicillin G Benzathine Swelling   Did it involve swelling of the face/tongue/throat, SOB, or low BP? Unknown Did it involve sudden or severe rash/hives, skin peeling, or any reaction on the inside of your mouth or nose? Unknown Did you need to seek medical attention at a hospital or doctor's office? Unknown When did it last happen?unknown If all above answers are "NO", may proceed with cephalosporin use.   Penicillins Swelling   Did it involve swelling of the face/tongue/throat, SOB, or low BP? Unknown Did it involve sudden or severe rash/hives, skin peeling, or any reaction on the inside of your mouth or nose? Unknown Did you need to seek medical attention at a hospital or doctor's office? Unknown When did it last happen?unknown If all above answers are "NO", may proceed with cephalosporin use.      Medication List    TAKE these medications   aspirin EC 81 MG tablet Take 81 mg by mouth at bedtime.   Cholecalciferol 1.25 MG (50000 UT) capsule Take 50,000 Units by mouth every Monday.   hydroxypropyl methylcellulose / hypromellose 2.5 % ophthalmic solution Commonly known as: ISOPTO TEARS / GONIOVISC Place 1 drop into the left eye 4 (four) times daily.   losartan 25 MG tablet Commonly known as: COZAAR TAKE 1 TABLET(25 MG) BY MOUTH DAILY What changed: See the new instructions.   tobramycin 0.3 % ophthalmic solution Commonly known as: TOBREX Place 1 drop into the left eye 2 (two) times daily.   vitamin B-12 100 MCG tablet Commonly  known as: CYANOCOBALAMIN Take 100 mcg by mouth daily.       Disposition and follow-up:   Nicole Blackwell was discharged from [facility] in [place] condition.  At the hospital follow up visit please address:  1.  Acute encephalopathy, Vascular dementia: Improved, returned to baseline. Infectious, metabolic and imaging workup negative      Polycythemia: Follow up CBC  2.  Labs / imaging needed at time of follow-up: CBC  3.  Pending labs/ test needing follow-up: None  Follow-up Appointments:   Hospital Course by problem list: 1.  Acute encephalopathy, Vascular dementia: Nicole Blackwell is 84 year old very pleasant African-American woman with medical he significant for vascular dementia, chronic diastolic heart failure and hypertension who presented to [facility] with a 1 to 2-day history of confusion, lethargy and weakness.  Work-up including infectious, metabolic and imaging results were all unremarkable.  Initially, her lactic acid was elevated however this resolved with IV fluids she returned to her baseline without any acute interventions.  She was evaluated by physical therapy who recommended for her to go to an acute rehab facility however the family decided to discharge home with home health.   2.  Chronic diastolic heart failure: She appears euvolemic during her entire stay  3.  Hypertension: She was slowly resumed on her home antihypertensives with losartan 25 mg daily  5.  Polycythemia: She was found to have undulating hemoglobin findings with range 14-16 in the past several months.  Hemoglobin during this  admission ranged 14.7-15.8.  She did not have any signs or symptoms that would suggest polycythemia vera however this will require close outpatient follow-up and monitoring.  Discharge Vitals:   BP 135/75 (BP Location: Left Arm)   Pulse 62   Temp 98.3 F (36.8 C) (Axillary)   Resp 18   Wt 65.2 kg   SpO2 100%   BMI 20.92 kg/m   Pertinent Labs,  Studies, and Procedures:  CLINICAL DATA:  Weakness emesis  EXAM: CT HEAD WITHOUT CONTRAST  TECHNIQUE: Contiguous axial images were obtained from the base of the skull through the vertex without intravenous contrast.  COMPARISON:  CT brain 12/20/2018, 08/27/2018, 05/20/2018, 10/08/2015  FINDINGS: Brain: No acute territorial infarction, hemorrhage, or intracranial mass. Atrophy and moderate to marked hypodensity in the white matter consistent with chronic small vessel ischemic change. Stable prominent ventricular size. Hypodensity at the medulla intermittently visualized on comparison studies, likely artifactual.  Vascular: No hyperdense vessels.  Carotid vascular calcification  Skull: Normal. Negative for fracture or focal lesion.  Sinuses/Orbits: No acute finding. Mild mucosal thickening in the maxillary and ethmoid sinuses  Other: None.  IMPRESSION: 1. No definite CT evidence for acute intracranial abnormality. 2. Atrophy and chronic small vessel ischemic changes of the white Matter    CLINICAL DATA:  Altered behavior.  EXAM: PORTABLE CHEST 1 VIEW  COMPARISON:  Radiograph 12/17/2018. CT 10/06/2016  FINDINGS: Stable mild cardiomegaly. Unchanged mediastinal contours allowing for rotation. There multiple overlying monitoring devices the partially limits assessment. No focal airspace disease, large pleural effusion or pneumothorax. No acute osseous abnormalities are seen.  IMPRESSION: Stable mild cardiomegaly. No acute abnormality.   CBC Latest Ref Rng & Units 02/26/2019 02/25/2019 02/24/2019  WBC 4.0 - 10.5 K/uL 4.5 4.8 6.5  Hemoglobin 12.0 - 15.0 g/dL 15.5(H) 15.8(H) 15.2(H)  Hematocrit 36.0 - 46.0 % 49.4(H) 51.3(H) 50.7(H)  Platelets 150 - 400 K/uL 213 205 205    Discharge Instructions: Discharge Instructions    Diet - low sodium heart healthy   Complete by: As directed    Discharge instructions   Complete by: As directed    Nicole Blackwell,   It  was a pleasure taking care of you here in the hospital. All the lab work and imaging we did were negative. You did well during your hospital stay. Please follow up with your primary doctor at [facility].  Take Care!   Increase activity slowly   Complete by: As directed       Signed: Jean Rosenthal, MD 02/26/2019, 11:41 AM   Pager: [phone] Internal Medicine Teaching Service

## 2019-02-26 NOTE — Progress Notes (Signed)
   Subjective: HD#2   Overnight: No acute overnight events reported  Today, EMERITA Blackwell feels well. She is up eating breakfast in the bed. Denies chest pain or SOB. Alert and oriented but doesn't remember why she came to the hospital. She states that she has not talked to her daughter yet today. No other complaints at this time.   Objective:  Vital signs in last 24 hours: Vitals:   02/25/19 0928 02/25/19 1224 02/25/19 1954 02/26/19 0631  BP: 138/67 (!) 160/80  (!) 149/68  Pulse:  61 61 62  Resp: 15 18  13   Temp:  98.1 F (36.7 C) 98 F (36.7 C) 98.3 F (36.8 C)  TempSrc:  Oral Oral Axillary  SpO2:   100% 100%  Weight:    65.2 kg   Const: In no apparent distress, lying comfortably in bed, conversational CV: RRR, soft systolic murmur, gallop, rub   Assessment/Plan:  Principal Problem:   Acute encephalopathy Active Problems:   Chronic diastolic CHF (congestive heart failure) (HCC)   Vascular dementia without behavioral disturbance (HCC)   Lethargy  Nicole Blackwell is an 84 year old woman with medical history significant for vascular dementia, chronic diastolic heart failure here for evaluation of 1 to 2-day history of acute encephalopathy, lethargy and weakness   #Acute encephalopathy-improved Her subjective and objective findings are unrevealing today.  She denies any symptoms presently and is stable to discharge home and follow-up with her PCP at [facility].   #Chronic diastolic heart failure -Appears euvolemic -Continue to monitor   #Hypertension -Continue home losartan 25 mg daily   #Vascular dementia -Stable   #Polycythemia Hgb 15.5<<15.8 -Improved with IVF likely secondary to dehydration -Require close monitoring outpatient     FEN: Heart healthy diet VTE ppx: Lovenox CODE STATUS: Full code  Nicole Rosenthal, MD 02/26/2019, 6:48 AM Pager: [phone] Internal Medicine Teaching Service

## 2019-02-26 NOTE — TOC Transition Note (Signed)
Transition of Care [facility]) - CM/SW Discharge Note   Patient Details  Name: Nicole Blackwell MRN: [id number] Date of Birth: [birth year]  Transition of Care [facility]) CM/SW Contact:  Bartholomew Crews, RN Phone Number: [phone] 02/26/2019, 11:56 AM   Clinical Narrative:    Notified of patient discharge order. Spoke with oncall nurse at [facility]. [facility] will arrive to pick up patient to transport home at 3:15pm. Notified daughter, [name], of scheduled time. Notified bedside RN, Colletta [place], of scheduled time. [facility] to handle all therapy and DME needs. No further transition needs identified at this time.    Final next level of care: Home/Self Care Barriers to Discharge: No Barriers Identified   Patient Goals and CMS Choice Patient states their goals for this hospitalization and ongoing recovery are:: Pt daughter wants her to come home CMS Medicare.gov Compare Post Acute Care list provided to:: Patient Represenative (must comment) Choice offered to / list presented to : Adult Children  Discharge Placement                       Discharge Plan and Services In-house Referral: Clinical Social Work Discharge Planning Services: NA Post Acute Care Choice: Home Health          DME Arranged: Walker rolling         HH Arranged: PT, OT HH Agency: [facility] [employer] Date [place]: 02/24/19 Time [place]: [place] Representative spoke with at [place]: [place] ([place]) Interventions     Readmission Risk Interventions No flowsheet data found.

## 2019-02-27 ENCOUNTER — Other Ambulatory Visit: Payer: Self-pay

## 2019-02-27 ENCOUNTER — Emergency Department (HOSPITAL_COMMUNITY): Payer: Medicare (Managed Care)

## 2019-02-27 ENCOUNTER — Encounter (HOSPITAL_COMMUNITY): Payer: Self-pay | Admitting: *Deleted

## 2019-02-27 ENCOUNTER — Inpatient Hospital Stay (HOSPITAL_COMMUNITY)
Admission: EM | Admit: 2019-02-27 | Discharge: 2019-03-03 | DRG: 948 | Disposition: A | Discharge status: Skilled Nursing Facility | Payer: Medicare (Managed Care) | Attending: Internal Medicine | Admitting: Internal Medicine

## 2019-02-27 DIAGNOSIS — I110 Hypertensive heart disease with heart failure: Secondary | ICD-10-CM | POA: Diagnosis present

## 2019-02-27 DIAGNOSIS — R0989 Other specified symptoms and signs involving the circulatory and respiratory systems: Secondary | ICD-10-CM | POA: Diagnosis present

## 2019-02-27 DIAGNOSIS — R2681 Unsteadiness on feet: Secondary | ICD-10-CM | POA: Diagnosis present

## 2019-02-27 DIAGNOSIS — H510 Palsy (spasm) of conjugate gaze: Secondary | ICD-10-CM | POA: Diagnosis present

## 2019-02-27 DIAGNOSIS — R531 Weakness: Principal | ICD-10-CM | POA: Diagnosis present

## 2019-02-27 DIAGNOSIS — I5032 Chronic diastolic (congestive) heart failure: Secondary | ICD-10-CM | POA: Diagnosis present

## 2019-02-27 DIAGNOSIS — F0280 Dementia in other diseases classified elsewhere without behavioral disturbance: Secondary | ICD-10-CM | POA: Diagnosis present

## 2019-02-27 DIAGNOSIS — R7611 Nonspecific reaction to tuberculin skin test without active tuberculosis: Secondary | ICD-10-CM | POA: Diagnosis present

## 2019-02-27 DIAGNOSIS — Z7982 Long term (current) use of aspirin: Secondary | ICD-10-CM

## 2019-02-27 DIAGNOSIS — Z79899 Other long term (current) drug therapy: Secondary | ICD-10-CM

## 2019-02-27 DIAGNOSIS — M810 Age-related osteoporosis without current pathological fracture: Secondary | ICD-10-CM | POA: Diagnosis present

## 2019-02-27 DIAGNOSIS — I639 Cerebral infarction, unspecified: Secondary | ICD-10-CM | POA: Diagnosis not present

## 2019-02-27 DIAGNOSIS — R260 Ataxic gait: Secondary | ICD-10-CM | POA: Diagnosis present

## 2019-02-27 DIAGNOSIS — I951 Orthostatic hypotension: Secondary | ICD-10-CM

## 2019-02-27 DIAGNOSIS — Z880 Allergy status to penicillin: Secondary | ICD-10-CM

## 2019-02-27 DIAGNOSIS — Z8542 Personal history of malignant neoplasm of other parts of uterus: Secondary | ICD-10-CM

## 2019-02-27 DIAGNOSIS — Z20822 Contact with and (suspected) exposure to covid-19: Secondary | ICD-10-CM | POA: Diagnosis present

## 2019-02-27 DIAGNOSIS — G309 Alzheimer's disease, unspecified: Secondary | ICD-10-CM | POA: Diagnosis present

## 2019-02-27 DIAGNOSIS — R509 Fever, unspecified: Secondary | ICD-10-CM | POA: Diagnosis not present

## 2019-02-27 LAB — URINALYSIS, ROUTINE W REFLEX MICROSCOPIC
Bilirubin Urine: NEGATIVE
Glucose, UA: NEGATIVE mg/dL
Hgb urine dipstick: NEGATIVE
Ketones, ur: NEGATIVE mg/dL
Leukocytes,Ua: NEGATIVE
Nitrite: NEGATIVE
Protein, ur: 30 mg/dL — AB
Specific Gravity, Urine: 1.023 (ref 1.005–1.030)
pH: 6 (ref 5.0–8.0)

## 2019-02-27 LAB — CBC
HCT: 51.2 % — ABNORMAL HIGH (ref 36.0–46.0)
Hemoglobin: 16 g/dL — ABNORMAL HIGH (ref 12.0–15.0)
MCH: 26 pg (ref 26.0–34.0)
MCHC: 31.3 g/dL (ref 30.0–36.0)
MCV: 83.3 fL (ref 80.0–100.0)
Platelets: 199 10*3/uL (ref 150–400)
RBC: 6.15 MIL/uL — ABNORMAL HIGH (ref 3.87–5.11)
RDW: 15 % (ref 11.5–15.5)
WBC: 4.9 10*3/uL (ref 4.0–10.5)
nRBC: 0 % (ref 0.0–0.2)

## 2019-02-27 LAB — BASIC METABOLIC PANEL
Anion gap: 10 (ref 5–15)
BUN: 12 mg/dL (ref 8–23)
CO2: 29 mmol/L (ref 22–32)
Calcium: 9.1 mg/dL (ref 8.9–10.3)
Chloride: 98 mmol/L (ref 98–111)
Creatinine, Ser: 0.84 mg/dL (ref 0.44–1.00)
GFR calc Af Amer: >60 mL/min (ref >60)
GFR calc non Af Amer: >60 mL/min (ref >60)
Glucose, Bld: 108 mg/dL — ABNORMAL HIGH (ref 70–99)
Potassium: 4.1 mmol/L (ref 3.5–5.1)
Sodium: 137 mmol/L (ref 135–145)

## 2019-02-27 IMAGING — MR MR HEAD W/O CM
8 of 11 series · 27 of 48 positions shown · non-contrast
Comparison: Head CT [DATE]

CLINICAL DATA: Vertigo.

EXAM:
MRI HEAD WITHOUT CONTRAST
TECHNIQUE: Multiplanar, multiecho pulse sequences of the brain and surrounding
structures were obtained without intravenous contrast.

[Series 2: DWI · axial · 3.0mm · 0.94mm/px · z∈[-99,+41]mm · 7 of 100 slices shown (1 of 2)]
[im 1/100]
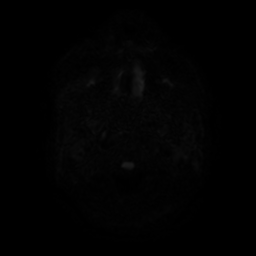
[im 17/100]
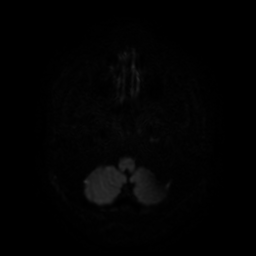
[im 34/100]
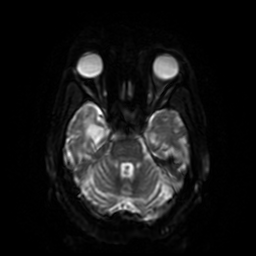
[im 50/100]
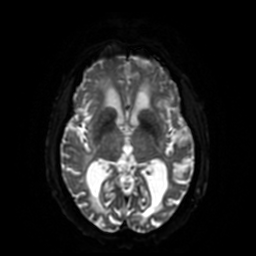
[im 67/100]
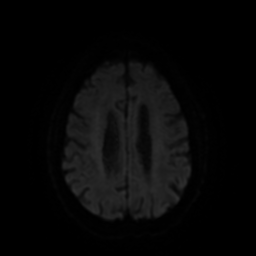
[im 83/100]
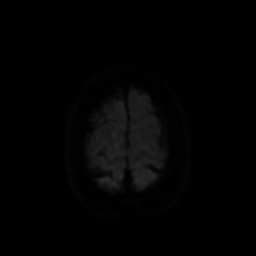
[im 100/100]
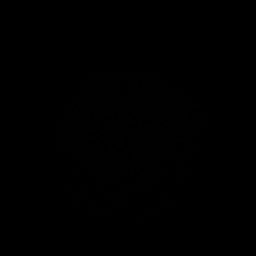

[Series 3: DWI · coronal · 4.0mm · 0.94mm/px · 6 of 74 slices shown (2 of 2)]
[im 1/74]
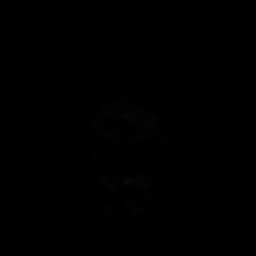
[im 15/74]
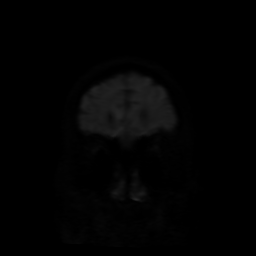
[im 30/74]
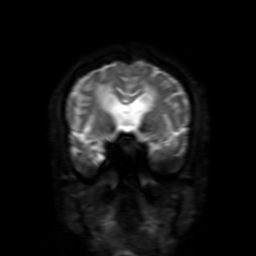
[im 44/74]
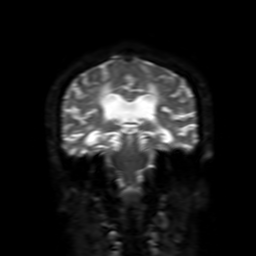
[im 59/74]
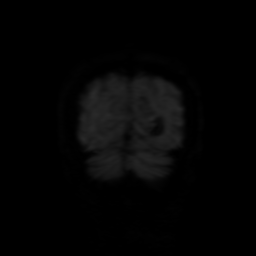
[im 74/74]
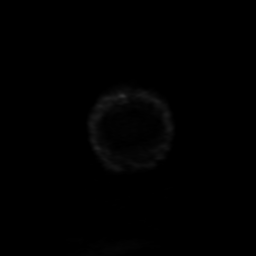

[Series 4: FLAIR · axial · 3.0mm · 0.49mm/px · z∈[-94,+42]mm · 2 of 25 slices shown (1 of 2)]
[im 1/25]
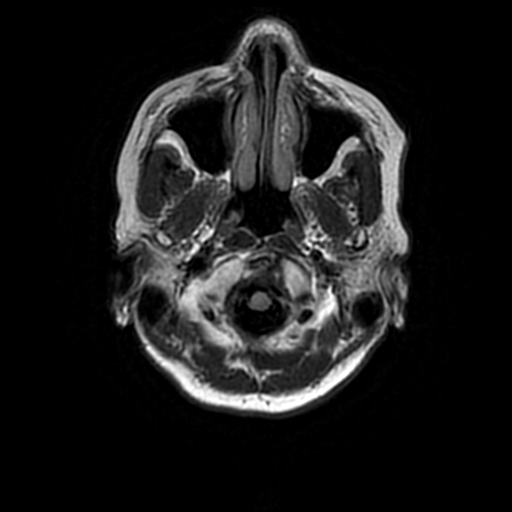
[im 25/25]
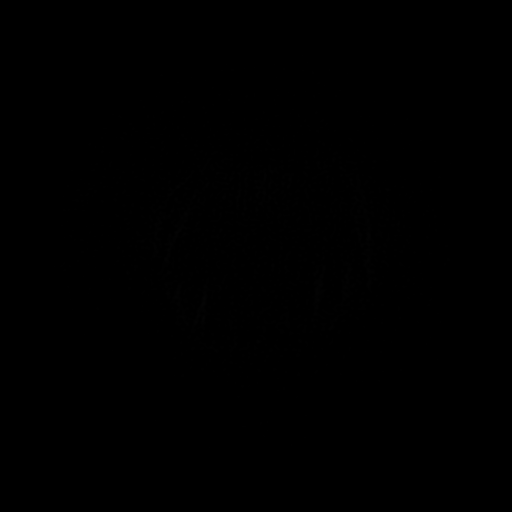

[Series 6: FLAIR · sagittal · 5.0mm · 0.47mm/px · 2 of 25 slices shown (2 of 2)]
[im 1/25]
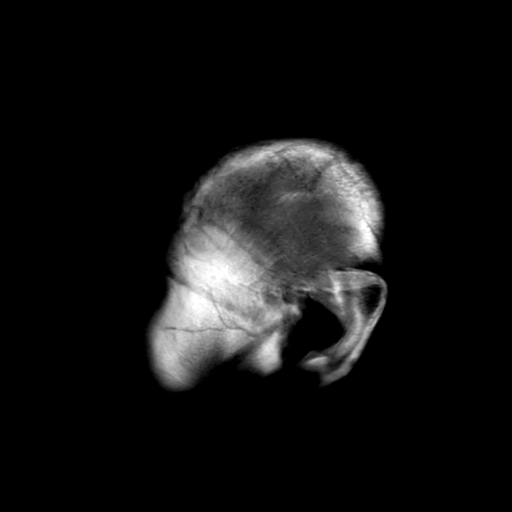
[im 25/25]
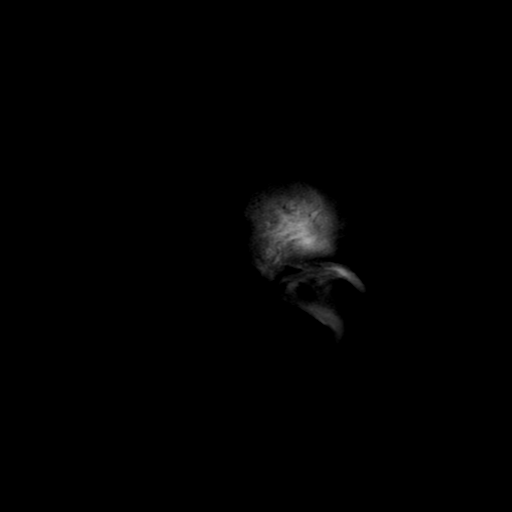

[Series 7: T2 · axial · 5.0mm · 0.24mm/px · z∈[-96,+40]mm · 2 of 25 slices shown (1 of 2)]
[im 1/25]
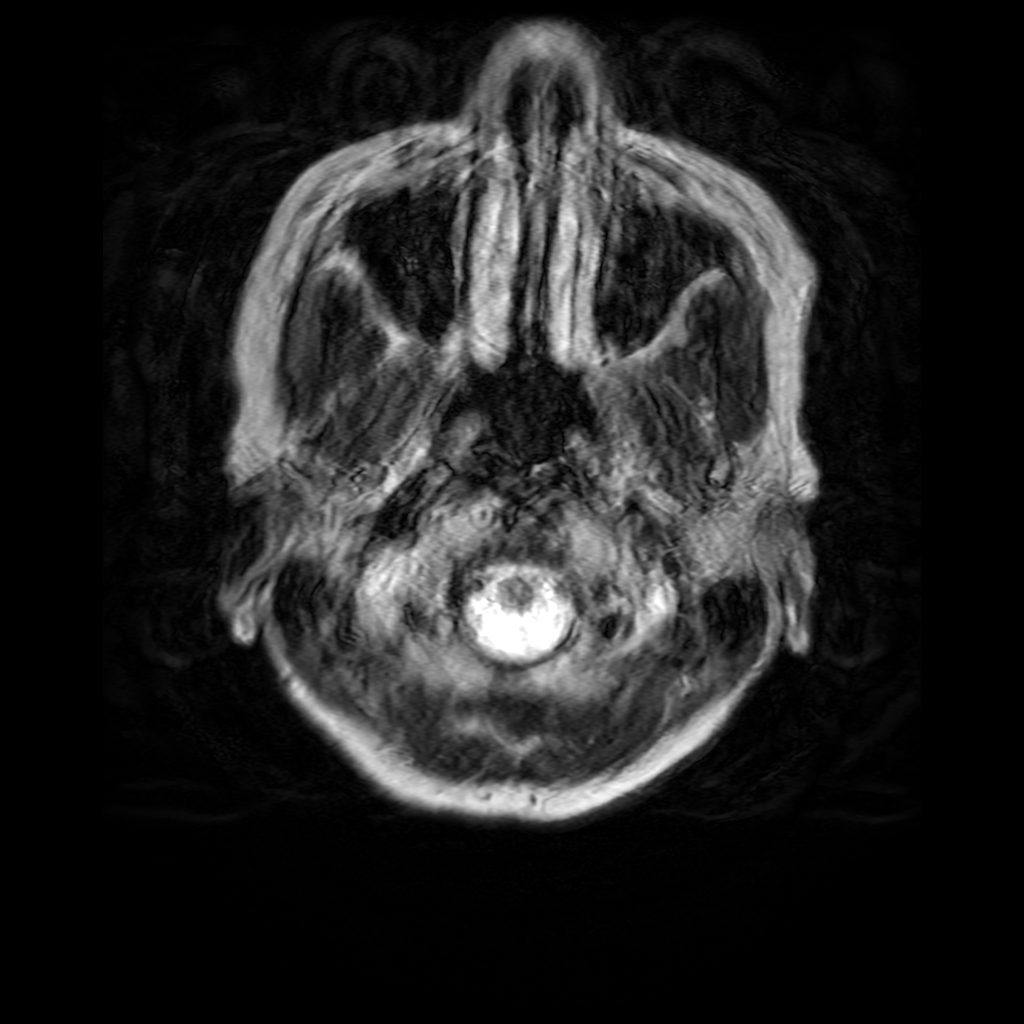
[im 25/25]
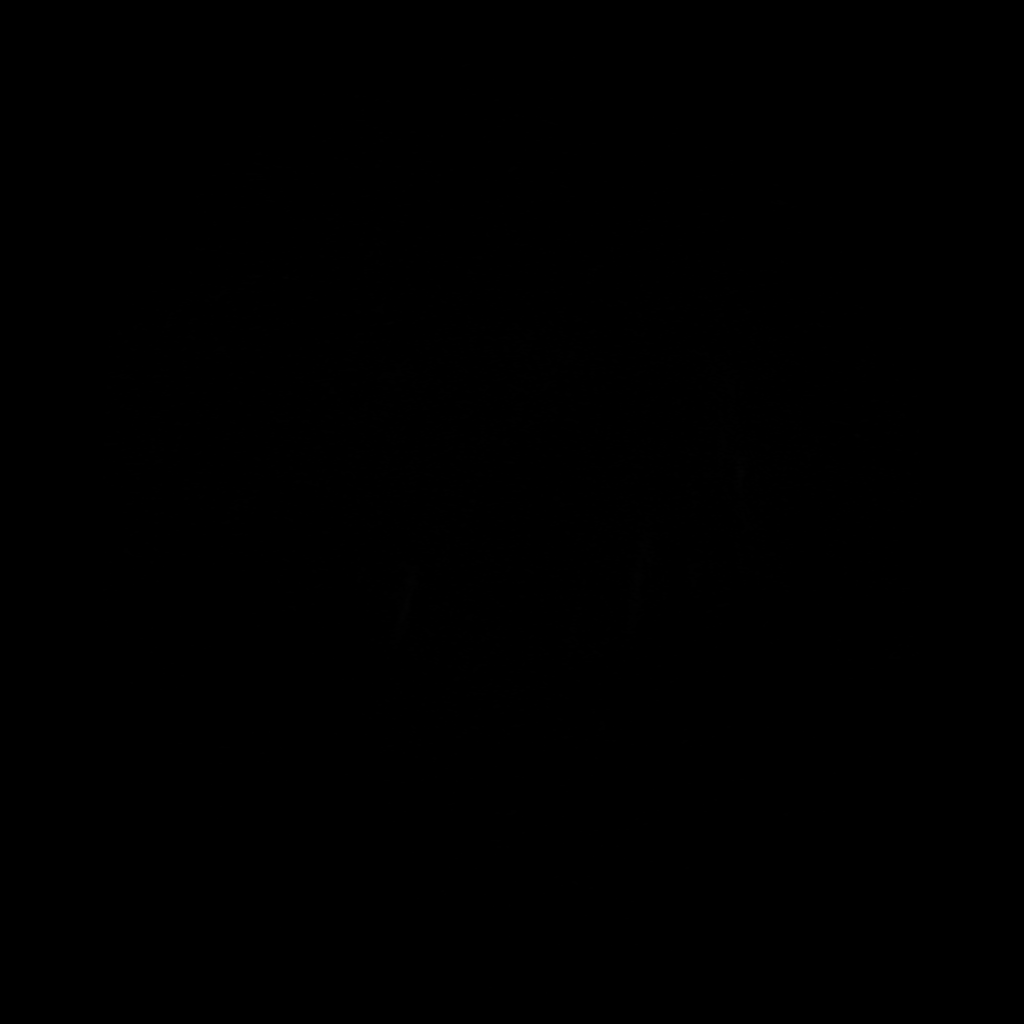

[Series 9: T2 · coronal · 5.0mm · 0.39mm/px · 1 of 30 slices shown (2 of 2)]
[im 1/30]
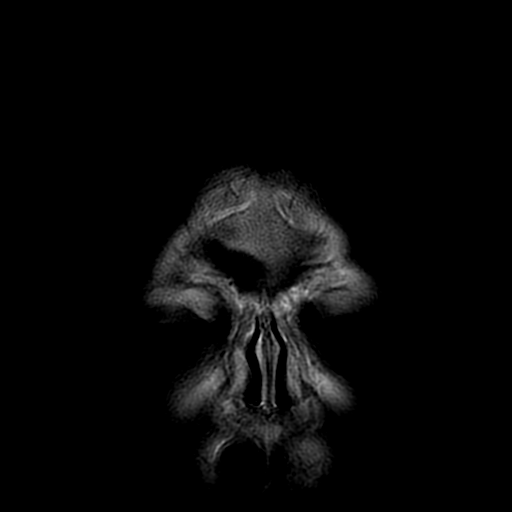

[Series 250: ADC · axial · 3.0mm · 0.94mm/px · z∈[-99,+38]mm · 4 of 49 slices shown (1 of 2)]
[im 1/49]
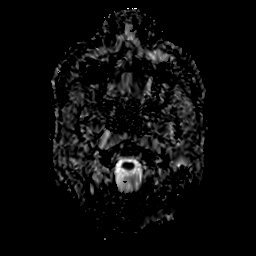
[im 17/49]
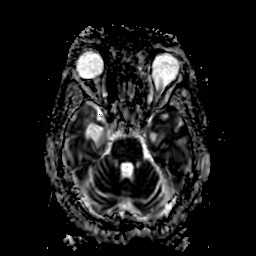
[im 33/49]
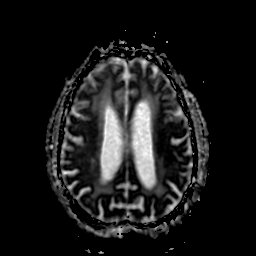
[im 49/49]
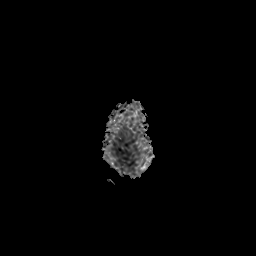

[Series 350: ADC · coronal · 4.0mm · 0.94mm/px · 3 of 37 slices shown (2 of 2)]
[im 1/37]
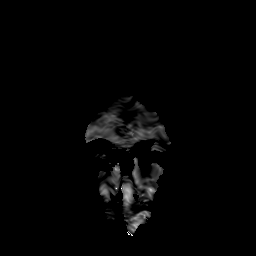
[im 19/37]
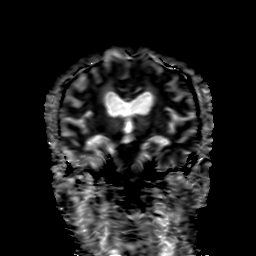
[im 37/37]
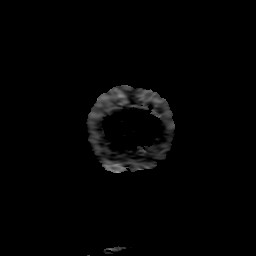

[27 of 48 positions shown; findings below may reference images not displayed]

FINDINGS: The study is motion degraded throughout with some sequences being
moderately to severely degraded.

Brain: There is a small amount of asymmetric mild trace diffusion
weighted signal hyperintensity in the left pons on axial imaging
with suggestion of reduced ADC, although this is an area prone to
artifact. This is not confirmed on coronal diffusion imaging
although assessment is limited by motion and slice selection, and no
corresponding signal abnormality is evident on conventional
sequences.

No acute infarct is identified elsewhere. No intracranial
hemorrhage, mass, midline shift, or extra-axial fluid collection is
evident. Confluent T2 hyperintensities in the cerebral white matter
bilaterally are nonspecific but compatible with extensive chronic
small vessel ischemic disease. There is moderate cerebral atrophy.

Vascular: Major intracranial vascular flow voids are preserved.

Skull and upper cervical spine: No suspicious marrow lesion.

Sinuses/Orbits: Bilateral cataract extraction. Paranasal sinuses and
mastoid air cells are clear.

Other: None.
IMPRESSION: 1. Motion degraded examination.
2. Possible small subacute infarct versus artifact in the left pons.
3. Extensive chronic small vessel ischemic disease.

## 2019-02-27 MED ORDER — SODIUM CHLORIDE 0.9% FLUSH: 3.0000 mL | Freq: Once | INTRAVENOUS | Status: DC

## 2019-02-27 NOTE — ED Notes (Signed)
Please call for updates  Simona Huh - [phone]

## 2019-02-27 NOTE — ED Provider Notes (Signed)
[facility] EMERGENCY DEPARTMENT Provider Note   CSN: [id number] Arrival date & time: 02/27/19  0710     History Chief Complaint  Patient presents with  . Weakness    Nicole Blackwell is a 84 y.o. female with past Alzheimer's dementia, CHF,HTN, brought into the ED by EMS from home.  Patient states she is unsure why she is here.  She was discharged from the hospital yesterday for admission for altered mental status with generalized weakness as well as nausea vomiting.  She states she was ambulatory in the hospital yesterday prior to discharge.  She has not attempted to walk today though states she feels somewhat better.  She denies abdominal pain, urinary symptoms, cough or fever.  Patient is oriented to person place and time.  The history is provided by the patient and the EMS personnel.   Per EMS, family called out for weakness.  At baseline patient is alert, interactive and ambulatory.    Past Medical History:  Diagnosis Date  . BACK PAIN   . Hypertension   . OSTEOPOROSIS   . PLANTAR FASCIITIS   . Positive PPD   . SHINGLES   . SYMPTOMATIC MENOPAUSAL/FEMALE CLIMACTERIC STATES   . Syncope and collapse 12/13/2014  . Uterine cancer [facility])     Patient Active Problem List   Diagnosis Date Noted  . Acute encephalopathy 02/24/2019  . Lethargy 02/24/2019  . AMS (altered mental status) 12/17/2018  . Fatigue 08/27/2018  . Protein calorie malnutrition ([place]) 05/24/2018  . Hypoalbuminemia 05/23/2018  . Abnormal EEG 05/22/2018  . Dementia ([place]) 05/21/2018  . Fall 05/20/2018  . Rhabdomyolysis 05/20/2018  . Bilateral lower extremity edema 05/20/2018  . Serum total bilirubin elevated 05/20/2018  . B12 deficiency 01/17/2018  . Vascular dementia without behavioral disturbance ([place]) 01/17/2018  . Laceration of scalp without foreign body   . Chronic diastolic CHF (congestive heart failure) ([place]) 12/13/2014  . Syncope 01/12/2012  . BACK PAIN 04/30/2008  . SHINGLES  09/27/2007  . SYMPTOMATIC MENOPAUSAL/FEMALE CLIMACTERIC STATES 06/20/2007  . Osteoporosis 03/11/2007  . PLANTAR FASCIITIS 11/16/2006    Past Surgical History:  Procedure Laterality Date  . CATARACT EXTRACTION, BILATERAL Bilateral   . DILATION AND CURETTAGE OF UTERUS  1960's X 2   "I lost 2 children to miscarriage"  . LAPAROSCOPIC CHOLECYSTECTOMY    . SIGMOIDOSCOPY    . TONSILLECTOMY    . VAGINAL HYSTERECTOMY       OB History   No obstetric history on file.     Family History  Problem Relation Age of Onset  . Healthy Mother   . Healthy Father     Social History   Tobacco Use  . Smoking status: Never Smoker  . Smokeless tobacco: Never Used  Substance Use Topics  . Alcohol use: No  . Drug use: No    Home Medications Prior to Admission medications   Medication Sig Start Date End Date Taking? Authorizing Provider  aspirin EC 81 MG tablet Take 81 mg by mouth at bedtime.    [provider]  Cholecalciferol 1.25 MG (50000 UT) capsule Take 50,000 Units by mouth every Monday.    [provider]  hydroxypropyl methylcellulose / hypromellose (ISOPTO TEARS / GONIOVISC) 2.5 % ophthalmic solution Place 1 drop into the left eye 4 (four) times daily.    [provider]  losartan (COZAAR) 25 MG tablet TAKE 1 TABLET(25 MG) BY MOUTH DAILY Patient taking differently: Take 25 mg by mouth daily.  09/18/18  Billie Ruddy, MD  tobramycin (TOBREX) 0.3 % ophthalmic solution Place 1 drop into the left eye 2 (two) times daily.    [provider]  vitamin B-12 (CYANOCOBALAMIN) 100 MCG tablet Take 100 mcg by mouth daily.    [provider]    Allergies    Penicillin g benzathine and Penicillins  Review of Systems   Review of Systems  All other systems reviewed and are negative.   Physical Exam Updated Vital Signs BP 138/66 (BP Location: Left Arm)   Pulse 61   Temp 98.8 F (37.1 C) (Oral)   Resp 15   SpO2 99%   Physical Exam Vitals  and nursing note reviewed.  Constitutional:      General: She is not in acute distress.    Appearance: She is well-developed.  HENT:     Head: Normocephalic and atraumatic.  Eyes:     Conjunctiva/sclera: Conjunctivae normal.  Cardiovascular:     Rate and Rhythm: Normal rate and regular rhythm.  Pulmonary:     Effort: Pulmonary effort is normal. No respiratory distress.     Breath sounds: Normal breath sounds.  Abdominal:     General: Bowel sounds are normal.     Palpations: Abdomen is soft.     Tenderness: There is no abdominal tenderness. There is no guarding or rebound.  Musculoskeletal:     Right lower leg: No edema.     Left lower leg: No edema.  Skin:    General: Skin is warm.  Neurological:     General: No focal deficit present.     Mental Status: She is alert and oriented to person, place, and time.     Comments: Mental Status:  Alert, oriented, thought content appropriate, able to give a coherent history. Speech fluent without evidence of aphasia. Able to follow 2 step commands without difficulty.  Cranial Nerves: grossly normal. Nl ROM, PERRL Motor:  Normal tone. 4/5 strength in upper and lower extremities bilaterally including strong and equal grip strength and dorsiflexion/plantar flexion. Pt is able to hold each leg off the bed for multiple seconds at a time. Sensory: grossly normal in all extremities.  CV: distal pulses palpable throughout '   Psychiatric:        Behavior: Behavior normal.     ED Results / Procedures / Treatments   Labs (all labs ordered are listed, but only abnormal results are displayed) Labs Reviewed  BASIC METABOLIC PANEL - Abnormal; Notable for the following components:      Result Value   Glucose, Bld 108 (*)    All other components within normal limits  CBC - Abnormal; Notable for the following components:   RBC 6.15 (*)    Hemoglobin 16.0 (*)    HCT 51.2 (*)    All other components within normal limits  URINALYSIS, ROUTINE W  REFLEX MICROSCOPIC    EKG EKG Interpretation  Date/Time:  Monday February 27 2019 12:21:51 EST Ventricular Rate:  57 PR Interval:    QRS Duration: 109 QT Interval:  469 QTC Calculation: 457 R Axis:   -76 Text Interpretation: Sinus rhythm Paired ventricular premature complexes Left anterior fascicular block Abnormal R-wave progression, late transition Left ventricular hypertrophy Abnrm T, probable ischemia, anterolateral lds ST elevation, consider inferior injury When compared to prior, two different morphologies similar to ECGs on dec31 and Oct 27. No STEMI Confirmed by Antony Blackbird [phone]) on 02/27/2019 12:39:44 PM   Radiology No results found.  Procedures Procedures (including critical care  time)  Medications Ordered in ED Medications  sodium chloride flush (NS) 0.9 % injection 3 mL (has no administration in time range)    ED Course  I have reviewed the triage vital signs and the nursing notes.  Pertinent labs & imaging results that were available during my care of the patient were reviewed by me and considered in my medical decision making (see chart for details).  Clinical Course as of Feb 27 1543  Mon Feb 27, 2019  1245 Attempted to contact family, no answer.   [JR]  1250 Attempted to contact family once more.  No answer.   [JR]    Clinical Course User Index [JR] Robinson, [place] N, PA-C   MDM Rules/Calculators/A&P                      Pt presenting via EMS with reported concern for inability to ambulate due to weakness. Pt discharged from hospital admission yesterday for altered mental status in the setting of nausea vomiting.  Patient is alert and oriented on evaluation today and in no apparent distress.  She states she has not attempted to walk today and does not feel like walking.  She does however states she does not feel very weak and feels fine.  She denies any other symptoms at this time.  Neuro exam reveals good strength to all extremities.  She is ambulated  at the bedside, per NT, patient stated she did not want to ambulate, however once she was encouraged to do so she was able to ambulate with minimal assistance.  Labs today are unremarkable.  Pending UA.  Care assumed at shift change by Dr. Elana Alm.  Plan to follow-up UA, treat as needed and anticipate discharge home.  After multiple attempts I was unable to successfully contact family. No acute findings today suggest need for readmission.    Pt discussed with Dr. Sherry Ruffing who work agrees with work-up and care plan at this time.  Final Clinical Impression(s) / ED Diagnoses Final diagnoses:  None    Rx / DC Orders ED Discharge Orders    None       Robinson, [place] N, PA-C 02/27/19 1548    Tegeler, Gwenyth Allegra, MD 02/27/19 [phone]

## 2019-02-27 NOTE — ED Triage Notes (Signed)
Pt arrived by [facility] from home. Pt has hx of dementia. Was just dc from hospital approx 2 days ago, family unsure of what diagnosis was. Incontinent of urine pta. pts baseline is ambulatory without walker and now unable to ambulate since discharge.

## 2019-02-27 NOTE — Progress Notes (Signed)
EDCSW met with Pt at bedside.

## 2019-02-27 NOTE — ED Notes (Signed)
Ask patient to walk she stated that she didn't want too. We encouraged her to walk she continued to lean backwards however, denied being dizzy. Patient shuffled feet when trying to walk, then returned to bed. The bed rails are up.

## 2019-02-27 NOTE — Progress Notes (Signed)
EDCSW called [facility] on-call nurse to update her in as to Pts status.

## 2019-02-27 NOTE — Discharge Planning (Signed)
Benita, SW from [facility] called offering disposition assistance.

## 2019-02-27 NOTE — Progress Notes (Signed)
EDCSW called spoke with [facility] on-call nurse, Elenor Legato at [phone] regarding PT. Nurse stated that [facility] case manager would be working with PT on placement.

## 2019-02-27 NOTE — ED Provider Notes (Signed)
Had one of her spells this AM.  CNA was supposed to arrive at 7. Daughter went in to give her morning medicine at 6AM, took medicine and then layed back on the bed. ATried to get her up Was so weak she couldn't sit up.  Had a spell where staring off and not responding, like she has had before, had episode for 5-93min.   NYE was shaky walking. Before NYE was able to walk independently After that she couldn't, weak, tired Physical Exam  BP (!) 149/73   Pulse 60   Temp 98.8 F (37.1 C) (Oral)   Resp 13   SpO2 100%   Physical Exam  ED Course/Procedures   Clinical Course as of Feb 26 1717  Mon Feb 27, 2019  1245 Attempted to contact family, no answer.   [JR]  1250 Attempted to contact family once more.  No answer.   [JR]    Clinical Course User Index [JR] Robinson, [place] N, PA-C    Procedures  MDM   Received care from [place] Robinson PA-C and Dr. Sherry Ruffing. Discharged from ED yesterday after admissino for encephalopathy, unclear etiology, improved with IV fluids, however pt was unable to ambulate normally and rehab was recommended which family declined.  Daughter reports that prior to new years eve, she walked without problems, walked independently and on day of prior admission was noted to have inability to walk, initially thought to be generalized weakness. With NT in ED, note states she continued to lean backwards and shuffled feet when trying to walk.  On my exam, even sitting up in the bed she is continuing to lean backwards with appearance of dysequilibrium and is unable to ambulate,  Concern that her difficulty walking may be secondary to posterior circulation stroke.  MRI ordered and shows possible left pontine infarct. Consulted Dr. Leonel Ramsay of Neurology.  Consulted medicine for readmission given pt unable to ambulate, concern for possible subacute infarct.           Gareth Morgan, MD 02/28/19 1314

## 2019-02-28 ENCOUNTER — Observation Stay (HOSPITAL_COMMUNITY): Payer: Medicare (Managed Care)

## 2019-02-28 ENCOUNTER — Telehealth: Payer: Self-pay

## 2019-02-28 ENCOUNTER — Inpatient Hospital Stay (HOSPITAL_COMMUNITY): Payer: Medicare (Managed Care)

## 2019-02-28 DIAGNOSIS — G309 Alzheimer's disease, unspecified: Secondary | ICD-10-CM | POA: Diagnosis present

## 2019-02-28 DIAGNOSIS — R2681 Unsteadiness on feet: Secondary | ICD-10-CM | POA: Diagnosis not present

## 2019-02-28 DIAGNOSIS — R4182 Altered mental status, unspecified: Secondary | ICD-10-CM

## 2019-02-28 DIAGNOSIS — R531 Weakness: Secondary | ICD-10-CM | POA: Diagnosis present

## 2019-02-28 DIAGNOSIS — Z880 Allergy status to penicillin: Secondary | ICD-10-CM

## 2019-02-28 DIAGNOSIS — Z79899 Other long term (current) drug therapy: Secondary | ICD-10-CM | POA: Diagnosis not present

## 2019-02-28 DIAGNOSIS — I951 Orthostatic hypotension: Secondary | ICD-10-CM | POA: Diagnosis present

## 2019-02-28 DIAGNOSIS — F0280 Dementia in other diseases classified elsewhere without behavioral disturbance: Secondary | ICD-10-CM | POA: Diagnosis present

## 2019-02-28 DIAGNOSIS — I509 Heart failure, unspecified: Secondary | ICD-10-CM

## 2019-02-28 DIAGNOSIS — Z7982 Long term (current) use of aspirin: Secondary | ICD-10-CM | POA: Diagnosis not present

## 2019-02-28 DIAGNOSIS — R509 Fever, unspecified: Secondary | ICD-10-CM | POA: Diagnosis not present

## 2019-02-28 DIAGNOSIS — R0989 Other specified symptoms and signs involving the circulatory and respiratory systems: Secondary | ICD-10-CM | POA: Diagnosis present

## 2019-02-28 DIAGNOSIS — I5030 Unspecified diastolic (congestive) heart failure: Secondary | ICD-10-CM | POA: Diagnosis not present

## 2019-02-28 DIAGNOSIS — Z8542 Personal history of malignant neoplasm of other parts of uterus: Secondary | ICD-10-CM | POA: Diagnosis not present

## 2019-02-28 DIAGNOSIS — I5032 Chronic diastolic (congestive) heart failure: Secondary | ICD-10-CM | POA: Diagnosis present

## 2019-02-28 DIAGNOSIS — I110 Hypertensive heart disease with heart failure: Secondary | ICD-10-CM

## 2019-02-28 DIAGNOSIS — H510 Palsy (spasm) of conjugate gaze: Secondary | ICD-10-CM | POA: Diagnosis present

## 2019-02-28 DIAGNOSIS — M810 Age-related osteoporosis without current pathological fracture: Secondary | ICD-10-CM | POA: Diagnosis present

## 2019-02-28 DIAGNOSIS — Z20822 Contact with and (suspected) exposure to covid-19: Secondary | ICD-10-CM | POA: Diagnosis present

## 2019-02-28 DIAGNOSIS — R7611 Nonspecific reaction to tuberculin skin test without active tuberculosis: Secondary | ICD-10-CM | POA: Diagnosis present

## 2019-02-28 DIAGNOSIS — I639 Cerebral infarction, unspecified: Secondary | ICD-10-CM | POA: Diagnosis present

## 2019-02-28 DIAGNOSIS — R260 Ataxic gait: Secondary | ICD-10-CM | POA: Diagnosis present

## 2019-02-28 LAB — BASIC METABOLIC PANEL
Anion gap: 12 (ref 5–15)
BUN: 18 mg/dL (ref 8–23)
CO2: 26 mmol/L (ref 22–32)
Calcium: 8.5 mg/dL — ABNORMAL LOW (ref 8.9–10.3)
Chloride: 94 mmol/L — ABNORMAL LOW (ref 98–111)
Creatinine, Ser: 0.85 mg/dL (ref 0.44–1.00)
GFR calc Af Amer: >60 mL/min (ref >60)
GFR calc non Af Amer: >60 mL/min (ref >60)
Glucose, Bld: 102 mg/dL — ABNORMAL HIGH (ref 70–99)
Potassium: 3.8 mmol/L (ref 3.5–5.1)
Sodium: 132 mmol/L — ABNORMAL LOW (ref 135–145)

## 2019-02-28 LAB — CBC
HCT: 51 % — ABNORMAL HIGH (ref 36.0–46.0)
Hemoglobin: 15.8 g/dL — ABNORMAL HIGH (ref 12.0–15.0)
MCH: 25.7 pg — ABNORMAL LOW (ref 26.0–34.0)
MCHC: 31 g/dL (ref 30.0–36.0)
MCV: 82.9 fL (ref 80.0–100.0)
Platelets: 195 10*3/uL (ref 150–400)
RBC: 6.15 MIL/uL — ABNORMAL HIGH (ref 3.87–5.11)
RDW: 14.8 % (ref 11.5–15.5)
WBC: 4.1 10*3/uL (ref 4.0–10.5)
nRBC: 0 % (ref 0.0–0.2)

## 2019-02-28 LAB — HEMOGLOBIN A1C
Hgb A1c MFr Bld: 5.6 % (ref 4.8–5.6)
Mean Plasma Glucose: 114.02 mg/dL

## 2019-02-28 LAB — CULTURE, BLOOD (SINGLE)
Culture: NO GROWTH
Special Requests: ADEQUATE

## 2019-02-28 LAB — CBG MONITORING, ED: Glucose-Capillary: 84 mg/dL (ref 70–99)

## 2019-02-28 IMAGING — MR MR HEAD W/O CM
4 series · 48 of 48 positions shown · non-contrast
Comparison: MRI head [DATE]

CLINICAL DATA: Vertigo.  Follow-up abnormal MRI.  Rule out infarct.

EXAM:
MRI HEAD WITHOUT CONTRAST
TECHNIQUE: Multiplanar, multiecho pulse sequences of the brain and surrounding
structures were obtained without intravenous contrast.

[Series 4: DWI · axial · 3.0mm · 0.70mm/px · z∈[-26,+17]mm · 16 of 31 slices shown (1 of 2)]
[im 1/31]
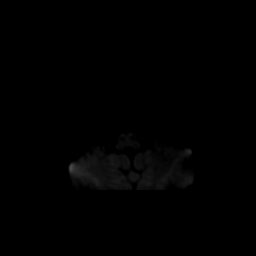
[im 3/31]
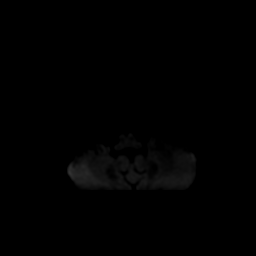
[im 5/31]
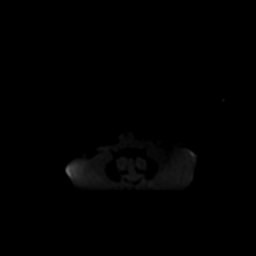
[im 7/31]
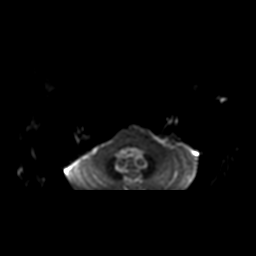
[im 9/31]
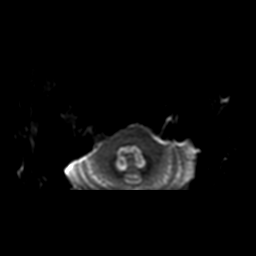
[im 11/31]
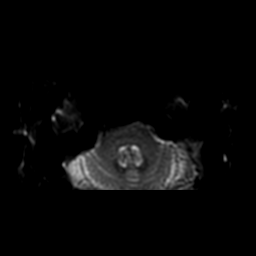
[im 13/31]
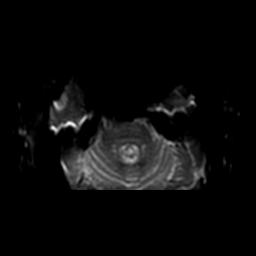
[im 15/31]
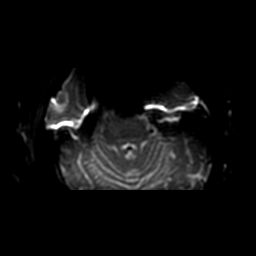
[im 17/31]
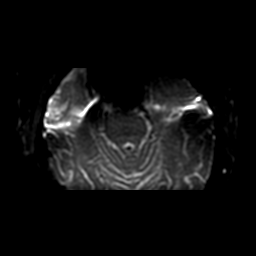
[im 19/31]
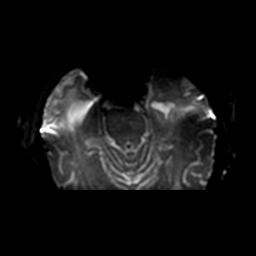
[im 21/31]
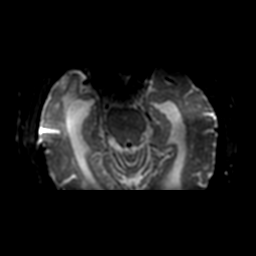
[im 23/31]
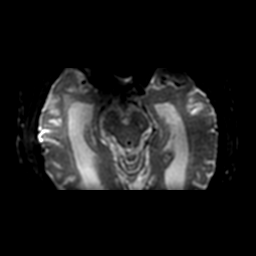
[im 25/31]
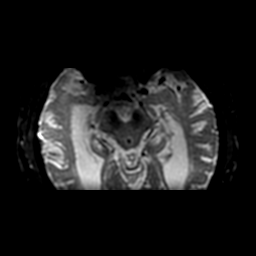
[im 27/31]
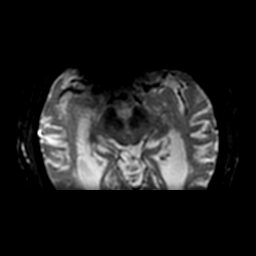
[im 29/31]
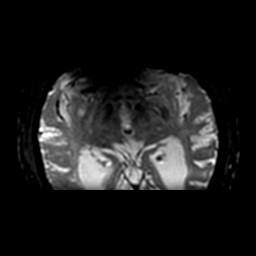
[im 31/31]
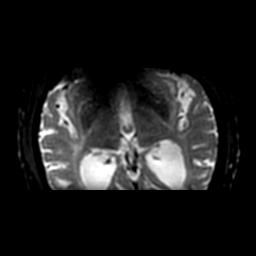

[Series 5: DWI · coronal · 3.0mm · 0.70mm/px · 16 of 30 slices shown (2 of 2)]
[im 1/30]
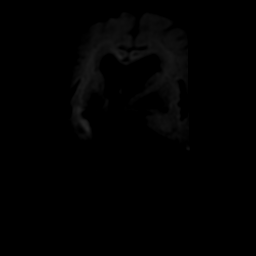
[im 2/30]
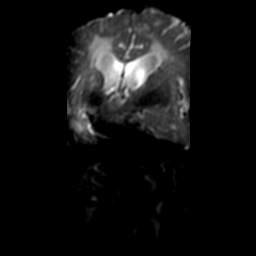
[im 4/30]
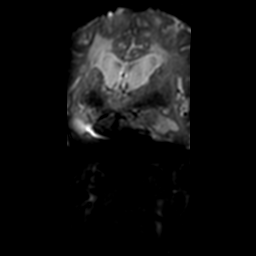
[im 6/30]
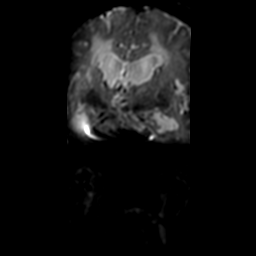
[im 8/30]
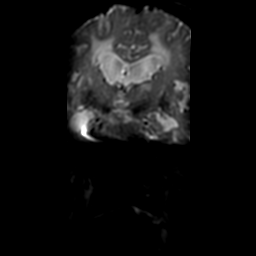
[im 10/30]
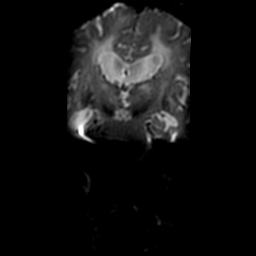
[im 12/30]
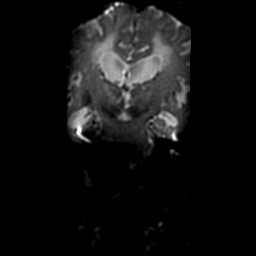
[im 14/30]
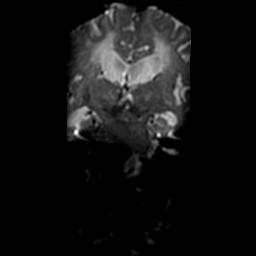
[im 16/30]
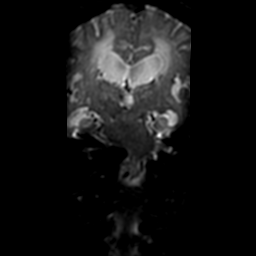
[im 18/30]
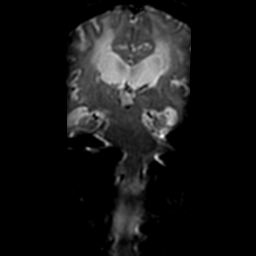
[im 20/30]
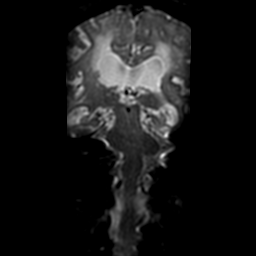
[im 22/30]
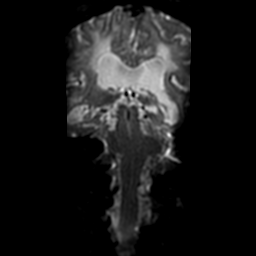
[im 24/30]
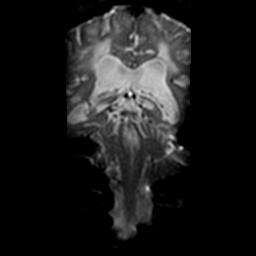
[im 26/30]
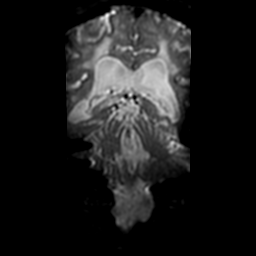
[im 28/30]
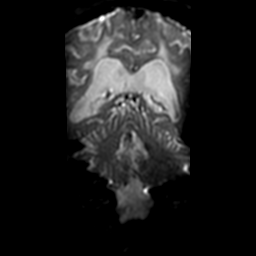
[im 30/30]
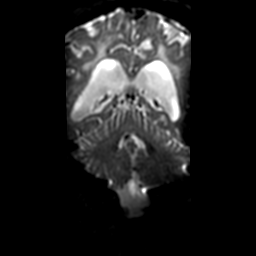

[Series 450: ADC · axial · 3.0mm · 0.70mm/px · z∈[-26,+17]mm · 8 of 15 slices shown (1 of 2)]
[im 1/15]
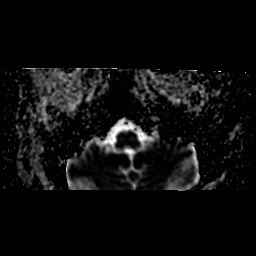
[im 3/15]
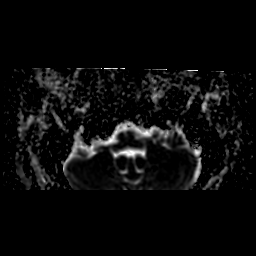
[im 5/15]
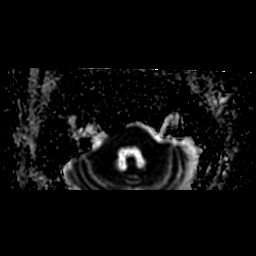
[im 7/15]
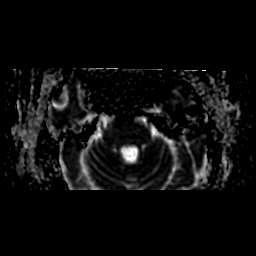
[im 9/15]
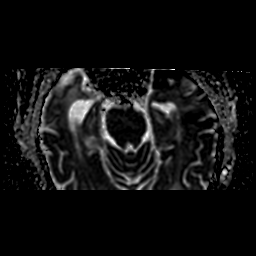
[im 11/15]
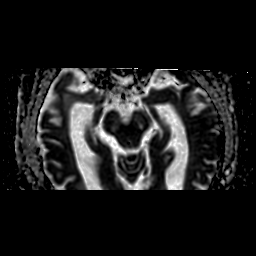
[im 13/15]
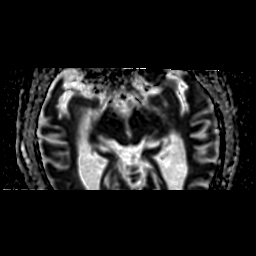
[im 15/15]
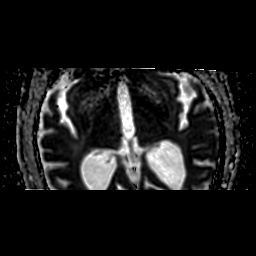

[Series 550: ADC · coronal · 3.0mm · 0.70mm/px · 8 of 15 slices shown (2 of 2)]
[im 1/15]
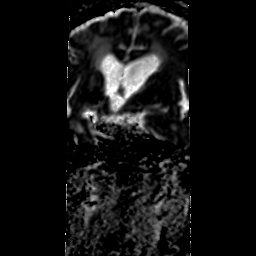
[im 3/15]
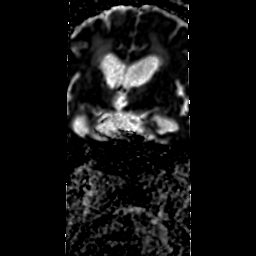
[im 5/15]
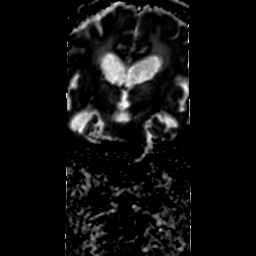
[im 7/15]
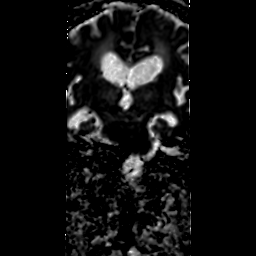
[im 9/15]
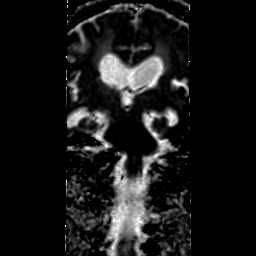
[im 11/15]
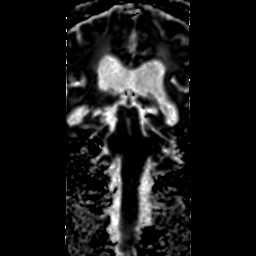
[im 13/15]
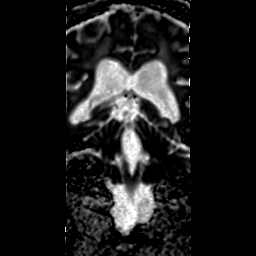
[im 15/15]
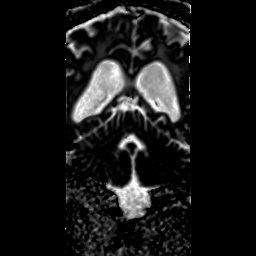

[48 of 48 positions shown; findings below may reference images not displayed]

FINDINGS: Thin-section diffusion was obtained through the brainstem to
evaluate the possible abnormality on the left identified previously.
On today's study, there is no evidence of restricted diffusion or
infarct in the brainstem or pons.
IMPRESSION: Focused diffusion-weighted imaging through the brainstem reveals no
acute infarct.

## 2019-02-28 MED ORDER — ENOXAPARIN SODIUM 40 MG/0.4ML ~~LOC~~ SOLN
40.0000 mg | Freq: Every day | SUBCUTANEOUS | Status: DC
  Administered 2019-03-01 – 2019-03-02 (×2): 40 mg via SUBCUTANEOUS
  Filled 2019-02-28 (×3): qty 0.4

## 2019-02-28 MED ORDER — LOSARTAN POTASSIUM 50 MG PO TABS: 25.0000 mg | ORAL_TABLET | Freq: Every day | ORAL | Status: DC

## 2019-02-28 MED ORDER — ACETAMINOPHEN 650 MG RE SUPP: 650.0000 mg | Freq: Four times a day (QID) | RECTAL | Status: DC | PRN

## 2019-02-28 MED ORDER — ONDANSETRON HCL 4 MG/2ML IJ SOLN: 4.0000 mg | Freq: Four times a day (QID) | INTRAMUSCULAR | Status: DC | PRN

## 2019-02-28 MED ORDER — VITAMIN D (ERGOCALCIFEROL) 1.25 MG (50000 UNIT) PO CAPS: 50000.0000 [IU] | ORAL_CAPSULE | ORAL | Status: DC

## 2019-02-28 MED ORDER — ASPIRIN EC 81 MG PO TBEC
81.0000 mg | DELAYED_RELEASE_TABLET | Freq: Every day | ORAL | Status: DC
  Administered 2019-02-28 – 2019-03-02 (×3): 81 mg via ORAL
  Filled 2019-02-28 (×3): qty 1

## 2019-02-28 MED ORDER — VITAMIN B-12 1000 MCG PO TABS
1000.0000 ug | ORAL_TABLET | Freq: Every day | ORAL | Status: DC
  Administered 2019-02-28 – 2019-03-03 (×4): 1000 ug via ORAL
  Filled 2019-02-28 (×4): qty 1

## 2019-02-28 MED ORDER — SENNOSIDES-DOCUSATE SODIUM 8.6-50 MG PO TABS: 1.0000 | ORAL_TABLET | Freq: Every evening | ORAL | Status: DC | PRN

## 2019-02-28 MED ORDER — LACTATED RINGERS IV SOLN: INTRAVENOUS | Status: AC

## 2019-02-28 MED ORDER — ACETAMINOPHEN 325 MG PO TABS: 650.0000 mg | ORAL_TABLET | Freq: Four times a day (QID) | ORAL | Status: DC | PRN

## 2019-02-28 MED ORDER — ONDANSETRON HCL 4 MG PO TABS: 4.0000 mg | ORAL_TABLET | Freq: Four times a day (QID) | ORAL | Status: DC | PRN

## 2019-02-28 NOTE — ED Notes (Signed)
Pt took PO medications with encouragement; refused lovenox injection

## 2019-02-28 NOTE — ED Notes (Signed)
Tele   Breakfast ordered  

## 2019-02-28 NOTE — ED Notes (Signed)
Restrain documentation on wrong pt.

## 2019-02-28 NOTE — ED Notes (Signed)
Pt feeding self -- no difficulty swallowing. To EEG

## 2019-02-28 NOTE — ED Notes (Signed)
Patient to EEG.  

## 2019-02-28 NOTE — H&P (Addendum)
Date: 02/28/2019               Patient Name:  Nicole Blackwell MRN: [id number]  DOB: 1935-12-15 Age / Sex: 84 y.o., female   PCP: Billie Ruddy, MD         Medical Service: Internal Medicine Teaching Service         Attending Physician: Dr. Lucious Groves, DO    First Contact: Dr. Madilyn Fireman Pager: [phone]  Second Contact: Dr. Eileen Stanford Pager: [phone]       After Hours (After 5p/  First Contact Pager: [phone]  weekends / holidays): Second Contact Pager: [phone]   Chief Complaint: Weakness  History of Present Illness: Patient is an 84 year old female with past medical history significant for Alzheimer's dementia, CHF, hypertension who was brought to the ED by EMS from home.  Patient is unaware why she is in the emergency room.  Per EMS, family called due to patient's weakness.  Patient states that at baseline (prior to last admission), patient is alert, interactive and ambulatory.  Patient denies any chest pain, shortness of breath, abdominal pain, nausea/vomiting, changes in bowel movements, swelling.  Meds:  Current Meds  Medication Sig  . aspirin EC 81 MG tablet Take 81 mg by mouth at bedtime.  Marland Kitchen losartan (COZAAR) 25 MG tablet TAKE 1 TABLET(25 MG) BY MOUTH DAILY (Patient taking differently: Take 25 mg by mouth daily. )   Allergies: Allergies as of 02/27/2019 - Review Complete 02/27/2019  Allergen Reaction Noted  . Penicillin g benzathine Swelling   . Penicillins Swelling 01/09/2012   Past Medical History:  Diagnosis Date  . BACK PAIN   . Hypertension   . OSTEOPOROSIS   . PLANTAR FASCIITIS   . Positive PPD   . SHINGLES   . SYMPTOMATIC MENOPAUSAL/FEMALE CLIMACTERIC STATES   . Syncope and collapse 12/13/2014  . Uterine cancer ([place])     Family History: Family history unable to be obtained due to dementia  Social History: Family history unable to be obtained due to dementia  Review of Systems: A complete ROS was negative except as per HPI.   Physical Exam: Blood  pressure (!) 177/65, pulse (!) 52, temperature 98.2 F (36.8 C), temperature source Oral, resp. rate 13, SpO2 100 %. Physical Exam  Constitutional: She is well-developed, well-nourished, and in no distress.  HENT:  Head: Normocephalic and atraumatic.  Eyes: EOM are normal. Right eye exhibits no discharge. Left eye exhibits no discharge.  Neck: No tracheal deviation present.  Cardiovascular: Normal rate and regular rhythm. Exam reveals no gallop and no friction rub.  No murmur heard. Pulmonary/Chest: Effort normal and breath sounds normal. No respiratory distress. She has no wheezes. She has no rales.  Abdominal: Soft. She exhibits no distension. There is no abdominal tenderness. There is no rebound and no guarding.  Musculoskeletal:        General: No tenderness, deformity or edema. Normal range of motion.     Cervical back: Normal range of motion.  Neurological: She is alert. Coordination normal.  Patient alert, oriented to self and location but not time of year or reason for hospitalization * Requires repeat prompting for commands * Unliateral left gaze palsy, no other cranial nerve deficits. * Strength 5/5 in upper/lower distal/proximal muscles   Skin: Skin is warm and dry. No rash noted. She is not diaphoretic. No erythema.  Psychiatric: Memory and judgment normal.    EKG: personally reviewed my interpretation is sinus tachycardia without evidence for  acute ischemia  CXR: personally reviewed my interpretation is not performed  Assessment & Plan by Problem: Active Problems:   Unsteadiness  Patient is an 84 year old female with past medical history significant for Alzheimer's dementia, CHF, hypertension who was brought to the ED by EMS from home for weakness.   # Weakness: Following last hospitalization, care team recommended placement in acute rehab facility.  However, family elected for discharge to home with home health.  Patient has returned to hospital given family's concern  for patient's continued weakness. * MRI brain was performed showing possible small subacute infarct versus artifact in the left pons.  Neurology was consulted.  They assess that patient's episodes of staring spells and weakness likely represent presyncope but her history of dementia also places her at increased risk of seizures.  Plan is to repeat MRI diffusion through the pons, conduct EEG and orthostatic vitals. * PT and OT * MRI diffusion through pons * EEG * Aspirin 81 mg daily  # Hypertension: Will hold home losartan in setting of possible stroke.  DVT Ppx: Lovenox 40 mg daily Diet: NPO pending speech eval Dispo: Admit patient to Observation with expected length of stay less than 2 midnights.  Signed: Jeanmarie Hubert, MD 02/28/2019, 3:34 AM  Pager: [phone]

## 2019-02-28 NOTE — ED Notes (Signed)
Pt refused morning labs, stating "You have already stuck me 3 times, it is hurting me". This NT explained that I have not stuck her at all. RN notified.

## 2019-02-28 NOTE — ED Notes (Signed)
Pt refuses to have lab collected this morning.

## 2019-02-28 NOTE — Progress Notes (Signed)
Per Nurse. Pt unavailable for EEG at this time. Pt going to MRI. Will attempt later when schedule permits

## 2019-02-28 NOTE — ED Notes (Signed)
Pt given Dinner tray,  Pt st's she does not want anything at this time.

## 2019-02-28 NOTE — Telephone Encounter (Signed)
Was unable to reach by telephone but discovered that patient returned to ED on 02/27/19 for weakness and was apparently admitted for observation. PT consult noted recommendation of SNF placement.

## 2019-02-28 NOTE — Progress Notes (Signed)
Interim Note  MRI brain with thin slices over brainstem negative for acute infarction  EEG: non specific bitemporal cortical dysfunction, no seizures/epileptiform discharges seen.  Orthostatic vital signs: positive

## 2019-02-28 NOTE — ED Notes (Signed)
Eating breakfast with no difficulty

## 2019-02-28 NOTE — Consult Note (Signed)
Neurology Consultation Reason for Consult: possible stroke Referring Physician: Billy Fischer, E  CC: Possible stroke  History is obtained from:patient, chart  HPI: Nicole Blackwell is a 84 y.o. female with a history of hypertension who presents with staring spell and unsteadiness.  When she first arrived, she was unable to sit up or walk.  She reportedly felt very weak at the time.  She was unable to walk earlier, and when trying to set her up, she would continue to lean backwards.  She was just discharged after episode with altered mental status that seemed to respond to fluids.    LKW: Unclear tpa given?: no, unclear time of onset   ROS: A 14 point ROS was performed and is negative except as noted in the HPI.   Past Medical History:  Diagnosis Date  . BACK PAIN   . Hypertension   . OSTEOPOROSIS   . PLANTAR FASCIITIS   . Positive PPD   . SHINGLES   . SYMPTOMATIC MENOPAUSAL/FEMALE CLIMACTERIC STATES   . Syncope and collapse 12/13/2014  . Uterine cancer ([place])      Family History  Problem Relation Age of Onset  . Healthy Mother   . Healthy Father      Social History:  reports that she has never smoked. She has never used smokeless tobacco. She reports that she does not drink alcohol or use drugs.   Exam: Current vital signs: BP (!) 148/66 (BP Location: Right Arm)   Pulse 70   Temp 99.1 F (37.3 C) (Oral)   Resp 15   SpO2 98%  Vital signs in last 24 hours: Temp:  [98.4 F (36.9 C)-99.1 F (37.3 C)] 99.1 F (37.3 C) (01/04 2054) Pulse Rate:  [57-70] 70 (01/04 2054) Resp:  [12-16] 15 (01/04 2054) BP: (113-153)/(59-73) 148/66 (01/04 2054) SpO2:  [98 %-100 %] 98 % (01/04 2054)   Physical Exam  Constitutional: Appears well-developed and well-nourished.  Psych: Affect appropriate to situation Eyes: No scleral injection HENT: No OP obstrucion MSK: no joint deformities.  Cardiovascular: Normal rate and regular rhythm.  Respiratory: Effort normal, non-labored  breathing GI: Soft.  No distension. There is no tenderness.  Skin: WDI  Neuro: Mental Status: Patient is awake, alert, gives month initially as December, then states she is not sure.  Unable to give year or age. No signs of aphasia or neglect Cranial Nerves: II: Visual Fields are full. Pupils are equal, round, and reactive to light.   III,IV, VI: EOMI without ptosis or diploplia.  V: Facial sensation is symmetric to temperature VII: Facial movement with mildly decreased nasolabial fold on the left VIII: hearing is intact to voice X: Uvula elevates symmetrically XI: Shoulder shrug is symmetric. XII: tongue is midline without atrophy or fasciculations.  Motor: Tone is normal. Bulk is normal.  She has very mild pronator drift on the left. Sensory: Sensation is symmetric to light touch and temperature in the arms and legs. Cerebellar: She has very mild dysmetria on finger-nose-finger on the left   I have reviewed labs in epic and the results pertinent to this consultation are: hct 51  I have reviewed the images obtained: MRI brain-?  Small pontine infarct  Impression: 84 year old female with staring spells and episodes of weakness.  I suspect that this likely represents presyncope, but with her history of dementia, she is at increased risk for seizures as well.  Is not clear to me if the small stroke seen on MRI is actually real, but repeat MRI would  be prudent.  She does have some mild left-sided deficits which I suspect are likely old and unrelated to the current possible finding on MRI.  Stroke in this area could certainly lead to gait and balance, however.-  Recommendations: 1) repeat MRI diffusion through the pons 2) EEG 3) orthostatic vital signs 4) neurology to follow   Roland Rack, MD [facility] [phone]  If 7pm- 7am, please page neurology on call as listed in [facility].

## 2019-02-28 NOTE — Progress Notes (Signed)
Subjective:  Nicole Blackwell was sitting in bed eating breakfast on bedside rounds today. She requested we reposition her in bed which we did. She has no other complaints. She is not quite sure why she is here.   Consults: neurology  Objective:  Vital signs in last 24 hours: Vitals:   02/27/19 1400 02/27/19 1430 02/27/19 2054 02/28/19 0243  BP: (!) 153/73 (!) 149/73 (!) 148/66 (!) 177/65  Pulse: 63 60 70 (!) 52  Resp: 13 13 15 13   Temp:   99.1 F (37.3 C) 98.2 F (36.8 C)  TempSrc:   Oral Oral  SpO2: 100% 100% 98% 100%   Physical Exam  Constitutional: She is well-developed, well-nourished, and in no distress. No distress.  HENT:  Head: Normocephalic and atraumatic.  Eyes: EOM are normal.  Cardiovascular: Normal rate.  No murmur heard. Pulmonary/Chest: Effort normal and breath sounds normal. No respiratory distress.  Abdominal: Soft. Bowel sounds are normal. She exhibits no distension.  Musculoskeletal:        General: Normal range of motion.     Cervical back: Normal range of motion.  Neurological: She is alert.  Pleasantly demented; does not follow commands well but no apparent left gaze palsy  Skin: Skin is warm and dry. She is not diaphoretic.  Nursing note and vitals reviewed.   I/Os: No intake or output data in the 24 hours ending 02/28/19 1054  Labs: Results for orders placed or performed during the hospital encounter of 02/27/19 (from the past 24 hour(s))  Urinalysis, Routine w reflex microscopic     Status: Abnormal   Collection Time: 02/27/19  3:01 PM  Result Value Ref Range   Color, Urine AMBER (A) YELLOW   APPearance HAZY (A) CLEAR   Specific Gravity, Urine 1.023 1.005 - 1.030   pH 6.0 5.0 - 8.0   Glucose, UA NEGATIVE NEGATIVE mg/dL   Hgb urine dipstick NEGATIVE NEGATIVE   Bilirubin Urine NEGATIVE NEGATIVE   Ketones, ur NEGATIVE NEGATIVE mg/dL   Protein, ur 30 (A) NEGATIVE mg/dL   Nitrite NEGATIVE NEGATIVE   Leukocytes,Ua NEGATIVE NEGATIVE   RBC / HPF  0-5 0 - 5 RBC/hpf   WBC, UA 0-5 0 - 5 WBC/hpf   Bacteria, UA RARE (A) NONE SEEN   Squamous Epithelial / LPF 0-5 0 - 5    Imaging:  CT Head Wo Contrast  Result Date: 02/23/2019 CLINICAL DATA:  Weakness emesis EXAM: CT HEAD WITHOUT CONTRAST TECHNIQUE: Contiguous axial images were obtained from the base of the skull through the vertex without intravenous contrast. COMPARISON:  CT brain 12/20/2018, 08/27/2018, 05/20/2018, 10/08/2015 FINDINGS: Brain: No acute territorial infarction, hemorrhage, or intracranial mass. Atrophy and moderate to marked hypodensity in the white matter consistent with chronic small vessel ischemic change. Stable prominent ventricular size. Hypodensity at the medulla intermittently visualized on comparison studies, likely artifactual. Vascular: No hyperdense vessels.  Carotid vascular calcification Skull: Normal. Negative for fracture or focal lesion. Sinuses/Orbits: No acute finding. Mild mucosal thickening in the maxillary and ethmoid sinuses Other: None. IMPRESSION: 1. No definite CT evidence for acute intracranial abnormality. 2. Atrophy and chronic small vessel ischemic changes of the white matter Electronically Signed   By: Donavan Foil M.D.   On: 02/23/2019 22:28   MR BRAIN WO CONTRAST  Result Date: 02/27/2019 CLINICAL DATA:  Vertigo. EXAM: MRI HEAD WITHOUT CONTRAST TECHNIQUE: Multiplanar, multiecho pulse sequences of the brain and surrounding structures were obtained without intravenous contrast. COMPARISON:  Head CT 02/23/2019 FINDINGS: The study is  motion degraded throughout with some sequences being moderately to severely degraded. Brain: There is a small amount of asymmetric mild trace diffusion weighted signal hyperintensity in the left pons on axial imaging with suggestion of reduced ADC, although this is an area prone to artifact. This is not confirmed on coronal diffusion imaging although assessment is limited by motion and slice selection, and no corresponding  signal abnormality is evident on conventional sequences. No acute infarct is identified elsewhere. No intracranial hemorrhage, mass, midline shift, or extra-axial fluid collection is evident. Confluent T2 hyperintensities in the cerebral white matter bilaterally are nonspecific but compatible with extensive chronic small vessel ischemic disease. There is moderate cerebral atrophy. Vascular: Major intracranial vascular flow voids are preserved. Skull and upper cervical spine: No suspicious marrow lesion. Sinuses/Orbits: Bilateral cataract extraction. Paranasal sinuses and mastoid air cells are clear. Other: None. IMPRESSION: 1. Motion degraded examination. 2. Possible small subacute infarct versus artifact in the left pons. 3. Extensive chronic small vessel ischemic disease. Electronically Signed   By: Logan Bores M.D.   On: 02/27/2019 19:23   XR Chest Single View  Result Date: 02/23/2019 CLINICAL DATA:  Altered behavior. EXAM: PORTABLE CHEST 1 VIEW COMPARISON:  Radiograph 12/17/2018. CT 10/06/2016 FINDINGS: Stable mild cardiomegaly. Unchanged mediastinal contours allowing for rotation. There multiple overlying monitoring devices the partially limits assessment. No focal airspace disease, large pleural effusion or pneumothorax. No acute osseous abnormalities are seen. IMPRESSION: Stable mild cardiomegaly. No acute abnormality. Electronically Signed   By: Keith Rake M.D.   On: 02/23/2019 22:04    Assessment/Plan:  Assessment: [name] is an 84 yo F w/ a PMHx of Alzheimer's dementia, HFpEF (EF 60-65% in 08/2018) and HTN who presented a few days after hospital discharge for weakness that is unchanged since prior admission with reports of ataxia found to have left gaze palsy on exam for which patient underwent CT Head and MRI which showed a possible infarct in the pons vs artifact here for additional workup and likely SNF placement.   Plan: Active Problems:   Unsteadiness -pt with previous  admission for weakness who declined SNF placement at discharge here with persistent weakness -pt with reports of ataxia found to have left gaze palsy on exam who underwent CT Head and MRI w/ possible infarct in the pons vs artifact undergoing additional workup -neuro following and requests repeat MRI with thinner slices in the pons and an EEG -PT ordered; will likely need SNF placement -patient has [facility] who has been called regarding hospital admission  Plan: -fu MRI -fu EEG -fu neuro recs -fu PT recs -fu [facility] recs -aspirin 81 mg daily  Dispo: Anticipated discharge pending further clinical workup.  Al Decant, MD 02/28/2019, 10:54 AM Pager: [phone]

## 2019-02-28 NOTE — Evaluation (Signed)
Physical Therapy Evaluation Patient Details Name: Nicole Blackwell MRN: [id number] DOB: May 22, 1935 Today's Date: 02/28/2019   History of Present Illness  84 yo female admitted to ED on 1/4 for unsteadiness and inability to ambulate, pt with recet d/c from ED on 1/3 for weakness and AMS. MRI of brain reveals possible small subacute infarct vs artifact in L pon, neurology following. PMH includes HTN, planar fasciitis, hx syncope, CA, vascular dementia, osteoporosis, CHF.  Clinical Impression   Pt presents with significant systemic weakness, max difficulty performing bed mobility and transfer, poor sitting and standing balance, decreased activity tolerance, and inability to progress to ambulation today due to impaired standing balance. Pt to benefit from acute PT to address deficits. Pt required max assist for bed mobility and transfer to EOB, unable to come to full standing with multiple attempts today due to posterior leaning and pt fatigue. PT recommending SNF level of care post-acutely given pt deficits. PT to progress mobility as tolerated, and will continue to follow acutely.      Follow Up Recommendations SNF;Supervision/Assistance - 24 hour    Equipment Recommendations  Other (comment)(TBD)    Recommendations for Other Services       Precautions / Restrictions Precautions Precautions: Fall Restrictions Weight Bearing Restrictions: No      Mobility  Bed Mobility Overal bed mobility: Needs Assistance Bed Mobility: Supine to Sit;Sit to Supine     Supine to sit: Max assist;HOB elevated Sit to supine: Max assist;HOB elevated   General bed mobility comments: max assist for supine<>sit for trunk elevation/lowering, LE lifting and translation into and out of bed, scooting to and from EOB with use of bed pads. Pt requires min guard to mod assist to maintain sitting balance at EOB, ~10 minutes EOB sitting.  Transfers Overall transfer level: Needs assistance Equipment used: Rolling  walker (2 wheeled) Transfers: Sit to/from Stand Sit to Stand: Max assist;From elevated surface         General transfer comment: Max assist for power up, anterior trunk lean, steadying. Sit to stand attempt x3, unable to come to full erect standing with pt's feet placed far in front of BOS and unable to correct with heavy posterior leaning.  Ambulation/Gait             General Gait Details: unable, pt could not come to full standing with x3 attempts  Stairs            Wheelchair Mobility    Modified Rankin (Stroke Patients Only)       Balance Overall balance assessment: Needs assistance Sitting-balance support: Bilateral upper extremity supported;Feet supported Sitting balance-Leahy Scale: Poor Sitting balance - Comments: posterior and L lateral leaning, requiring min-mod assist to correct by PT. Postural control: Posterior lean;Left lateral lean Standing balance support: Bilateral upper extremity supported;During functional activity Standing balance-Leahy Scale: Poor Standing balance comment: reliant on external support in standing, heavy posterior leaning                             Pertinent Vitals/Pain Pain Assessment: No/denies pain Pain Score: 0-No pain Pain Intervention(s): Monitored during session    Home Living Family/patient expects to be discharged to:: Private residence Living Arrangements: Children;Spouse/significant other(daughter) Available Help at Discharge: Family Type of Home: House Home Access: Stairs to enter   [employer] of Steps: a few     Additional Comments: pt states "no" when asked if she typically walks with a walker, other than  that pt unable to provide subjective information due to confusion. When asked where she as prior to ED, pt states "I don't know".    Prior Function     Gait / Transfers Assistance Needed: pt states she does not use AD to ambulate     Comments: unsure of accuracy of information  above, pt with baseline of vascular dementia and no family present during eval.     Hand Dominance   Dominant Hand: Right    Extremity/Trunk Assessment   Upper Extremity Assessment Upper Extremity Assessment: Generalized weakness    Lower Extremity Assessment Lower Extremity Assessment: Generalized weakness    Cervical / Trunk Assessment Cervical / Trunk Assessment: Normal  Communication   Communication: Other (comment)(speaks quietly, at times difficult to understand)  Cognition Arousal/Alertness: Awake/alert Behavior During Therapy: Flat affect Overall Cognitive Status: History of cognitive impairments - at baseline Area of Impairment: Orientation;Attention;Memory;Following commands;Safety/judgement;Awareness;Problem solving                 Orientation Level: Disoriented to;Situation;Time;Place Current Attention Level: Sustained Memory: Decreased recall of precautions;Decreased short-term memory Following Commands: Follows one step commands consistently;Follows one step commands with increased time Safety/Judgement: Decreased awareness of safety;Decreased awareness of deficits Awareness: Intellectual Problem Solving: Slow processing;Decreased initiation;Difficulty sequencing;Requires verbal cues;Requires tactile cues General Comments: pt able to state name but not where she is or where she was prior to hospitalization. Pt requires repeated multimodal cuing for mobility, pt with very increased processing time.      General Comments General comments (skin integrity, edema, etc.): pt soiled in urine, required total assist for removal of brief and application of clean brief and bedding. Pt able to bridge x2 for application of new brief.    Exercises     Assessment/Plan    PT Assessment Patient needs continued PT services  PT Problem List Decreased strength;Decreased knowledge of use of DME;Decreased safety awareness;Decreased balance;Decreased mobility;Decreased  activity tolerance;Decreased cognition       PT Treatment Interventions DME instruction;Balance training;Gait training;Neuromuscular re-education;Stair training;Functional mobility training;Patient/family education;Therapeutic activities;Therapeutic exercise    PT Goals (Current goals can be found in the Care Plan section)  Acute Rehab PT Goals PT Goal Formulation: Patient unable to participate in goal setting Time For Goal Achievement: 03/14/19 Potential to Achieve Goals: Fair    Frequency Min 2X/week   Barriers to discharge        Co-evaluation               AM-PAC PT "6 Clicks" Mobility  Outcome Measure Help needed turning from your back to your side while in a flat bed without using bedrails?: A Lot Help needed moving from lying on your back to sitting on the side of a flat bed without using bedrails?: Total Help needed moving to and from a bed to a chair (including a wheelchair)?: Total Help needed standing up from a chair using your arms (e.g., wheelchair or bedside chair)?: Total Help needed to walk in hospital room?: Total Help needed climbing 3-5 steps with a railing? : Total 6 Click Score: 7    End of Session Equipment Utilized During Treatment: Gait belt Activity Tolerance: Patient limited by fatigue Patient left: in bed(pt in hallway of ED) Nurse Communication: Mobility status PT Visit Diagnosis: Unsteadiness on feet (R26.81);Difficulty in walking, not elsewhere classified (R26.2);Muscle weakness (generalized) (M62.81)    Time: 1010-1038 PT Time Calculation (min) (ACUTE ONLY): 28 min   Charges:   PT Evaluation $PT Eval Low Complexity: 1 Low PT Treatments $  Therapeutic Activity: 8-22 mins        Jamiria Langill E, PT Acute Rehabilitation Services Pager [phone]  Office [phone]   Roxine Caddy D Elonda Husky 02/28/2019, 11:57 AM

## 2019-02-28 NOTE — Procedures (Signed)
Patient Name: Nicole Blackwell  MRN: [id number]  Epilepsy Attending: Lora Havens  Referring Physician/Provider: Dr Zane Herald Date: 02/28/2019 Duration: 23.44 mins  Patient history: JALEESHA BUCKEL is a 84 y.o. female with a history of hypertension who presents with staring spell and unsteadiness. EEG to evaluate for seizure  Level of alertness: awake  AEDs during EEG study: None  Technical aspects: This EEG study was done with scalp electrodes positioned according to the 10-20 International system of electrode placement. Electrical activity was acquired at a sampling rate of 500Hz  and reviewed with a high frequency filter of 70Hz  and a low frequency filter of 1Hz . EEG data were recorded continuously and digitally stored.   DESCRIPTION: No clear posterior dominant rhythm was seen. EEG showed continuous generalized 3-6hz  theta-delta slowing, maximal bitemporal region. Hyperventilation and photic stimulation were not performed.  ABNORMALITY - Continuous slow, generalized, maximal bitemporal  IMPRESSION: This study is suggestive of non specific bitemporal cortical dysfunction as well as mild to moderate diffuse encephalopathy. No seizures or epileptiform discharges were seen throughout the recording.  Antonino Nienhuis Barbra Sarks

## 2019-02-28 NOTE — ED Notes (Signed)
Pt in MRI.

## 2019-03-01 DIAGNOSIS — I951 Orthostatic hypotension: Secondary | ICD-10-CM

## 2019-03-01 DIAGNOSIS — I5030 Unspecified diastolic (congestive) heart failure: Secondary | ICD-10-CM

## 2019-03-01 LAB — SARS CORONAVIRUS 2 (TAT 6-24 HRS): SARS Coronavirus 2: NEGATIVE

## 2019-03-01 LAB — GLUCOSE, CAPILLARY: Glucose-Capillary: 91 mg/dL (ref 70–99)

## 2019-03-01 MED ORDER — AMLODIPINE BESYLATE 5 MG PO TABS: 2.5000 mg | ORAL_TABLET | Freq: Every day | ORAL | Status: DC

## 2019-03-01 MED ORDER — HYDRALAZINE HCL 20 MG/ML IJ SOLN: 5.0000 mg | Freq: Three times a day (TID) | INTRAMUSCULAR | Status: DC | PRN

## 2019-03-01 MED ORDER — LOSARTAN POTASSIUM 25 MG PO TABS: 25.0000 mg | ORAL_TABLET | Freq: Every day | ORAL | Status: DC

## 2019-03-01 MED ORDER — MIRTAZAPINE 15 MG PO TBDP: 15.0000 mg | ORAL_TABLET | Freq: Every day | ORAL | 0 refills | Status: DC

## 2019-03-01 MED ORDER — MIRTAZAPINE 15 MG PO TBDP
15.0000 mg | ORAL_TABLET | Freq: Every day | ORAL | Status: DC
  Administered 2019-03-01: 15 mg via ORAL
  Filled 2019-03-01: qty 1

## 2019-03-01 MED ORDER — LOSARTAN POTASSIUM 50 MG PO TABS
50.0000 mg | ORAL_TABLET | Freq: Every day | ORAL | Status: DC
  Administered 2019-03-01 – 2019-03-03 (×3): 50 mg via ORAL
  Filled 2019-03-01 (×3): qty 1

## 2019-03-01 MED ORDER — LEVETIRACETAM 250 MG PO TABS: 250.0000 mg | ORAL_TABLET | Freq: Two times a day (BID) | ORAL | 0 refills | Status: DC

## 2019-03-01 MED ORDER — SODIUM CHLORIDE 0.9 % IV SOLN: INTRAVENOUS | Status: AC

## 2019-03-01 NOTE — Progress Notes (Addendum)
Dr. Madilyn Fireman was made aware that pt had ST segment increase to 3.4 at 0340am. Pt A&Ox1, denied CP and SOB. Skin warm and dry. Dr. Madilyn Fireman did not want a EKG done presently.

## 2019-03-01 NOTE — Plan of Care (Signed)
  Problem: Clinical Measurements: Goal: Ability to maintain clinical measurements within normal limits will improve Outcome: Progressing   Problem: Clinical Measurements: Goal: Respiratory complications will improve Outcome: Progressing   Problem: Nutrition: Goal: Adequate nutrition will be maintained Outcome: Not Progressing

## 2019-03-01 NOTE — Progress Notes (Signed)
  Speech Language Pathology  Patient Details Name: Nicole Blackwell MRN: [id number] DOB: Apr 20, 1935 Today's Date: 03/01/2019 Time:  -     Order written for swallow assessment prior to [facility] swallow screen. Pt passes [facility]. Seen by ST 10/20 without s/s aspiration. RN states she has not observed pt eating. ST will sign off and please reorder if needed.                Houston Siren 03/01/2019, 10:34 AM   Orbie Pyo Colvin Caroli.Ed Risk analyst [phone] Office [phone]

## 2019-03-01 NOTE — NC FL2 (Signed)
[place] MEDICAID FL2 LEVEL OF CARE SCREENING TOOL     IDENTIFICATION  Patient Name: Nicole Blackwell Birthdate: 06/20/1935 Sex: female Admission Date (Current Location): 02/27/2019  [facility] and [place] Number:  Herbalist and Address:  [facility], [place] [address] [place], [place] [address]      Provider Number: [id number]  Attending Physician Name and Address:  Lucious Groves, DO  Relative Name and Phone Number:  Annesha Litts, daughter [phone]    Current Level of Care: Hospital Recommended Level of Care: [place] Prior Approval Number:    Date Approved/Denied:   PASRR Number: [id number]  Discharge Plan:      Current Diagnoses: Patient Active Problem List   Diagnosis Date Noted  . Unsteadiness 02/28/2019  . Suspected cerebrovascular accident (CVA) 02/28/2019  . Acute encephalopathy 02/24/2019  . Lethargy 02/24/2019  . AMS (altered mental status) 12/17/2018  . Fatigue 08/27/2018  . Protein calorie malnutrition ([place]) 05/24/2018  . Hypoalbuminemia 05/23/2018  . Abnormal EEG 05/22/2018  . Dementia ([place]) 05/21/2018  . Fall 05/20/2018  . Rhabdomyolysis 05/20/2018  . Bilateral lower extremity edema 05/20/2018  . Serum total bilirubin elevated 05/20/2018  . B12 deficiency 01/17/2018  . Vascular dementia without behavioral disturbance ([place]) 01/17/2018  . Laceration of scalp without foreign body   . Chronic diastolic CHF (congestive heart failure) ([place]) 12/13/2014  . Syncope 01/12/2012  . BACK PAIN 04/30/2008  . SHINGLES 09/27/2007  . SYMPTOMATIC MENOPAUSAL/FEMALE CLIMACTERIC STATES 06/20/2007  . Osteoporosis 03/11/2007  . PLANTAR FASCIITIS 11/16/2006    Orientation RESPIRATION BLADDER Height & Weight     Self  Normal Incontinent Weight:   Height:     BEHAVIORAL SYMPTOMS/MOOD NEUROLOGICAL BOWEL NUTRITION STATUS      Continent Diet  AMBULATORY STATUS COMMUNICATION OF NEEDS Skin   Extensive Assist Verbally  Normal                       Personal Care Assistance Level of Assistance  Bathing, Feeding, Dressing Bathing Assistance: Maximum assistance Feeding assistance: Limited assistance Dressing Assistance: Maximum assistance     Functional Limitations Info  Sight, Hearing, Speech Sight Info: Adequate Hearing Info: Adequate Speech Info: Adequate    SPECIAL CARE FACTORS FREQUENCY  PT (By licensed PT), OT (By licensed OT)     PT Frequency: PT to eval and treat 5 x week OT Frequency: OT to eval and treat 5 x week            Contractures Contractures Info: Not present    Additional Factors Info  Code Status, Allergies Code Status Info: Full Allergies Info: penicillin           Current Medications (03/01/2019):  This is the current hospital active medication list Current Facility-Administered Medications  Medication Dose Route Frequency Provider Last Rate Last Admin  . 0.9 %  sodium chloride infusion   Intravenous Continuous Jean Rosenthal, MD 100 mL/hr at 03/01/19 1029 New Bag at 03/01/19 1029  . acetaminophen (TYLENOL) tablet 650 mg  650 mg Oral Q6H PRN Masoudi, Elhamalsadat, MD       Or  . acetaminophen (TYLENOL) suppository 650 mg  650 mg Rectal Q6H PRN Masoudi, Elhamalsadat, MD      . aspirin EC tablet 81 mg  81 mg Oral QHS Masoudi, Elhamalsadat, MD   81 mg at 02/28/19 2111  . enoxaparin (LOVENOX) injection 40 mg  40 mg Subcutaneous Daily Masoudi, Dorthula Rue, MD      .  hydrALAZINE (APRESOLINE) injection 5 mg  5 mg Intravenous Q8H PRN Agyei, Obed K, MD      . losartan (COZAAR) tablet 50 mg  50 mg Oral Daily Agyei, Obed K, MD   50 mg at 03/01/19 1054  . mirtazapine (REMERON SOL-TAB) disintegrating tablet 15 mg  15 mg Oral QHS Al Decant, MD      . ondansetron [facility]) tablet 4 mg  4 mg Oral Q6H PRN Masoudi, Elhamalsadat, MD       Or  . ondansetron (ZOFRAN) injection 4 mg  4 mg Intravenous Q6H PRN Masoudi, Elhamalsadat, MD      . senna-docusate (Senokot-S) tablet 1  tablet  1 tablet Oral QHS PRN Masoudi, Elhamalsadat, MD      . sodium chloride flush (NS) 0.9 % injection 3 mL  3 mL Intravenous Once Tegeler, Gwenyth Allegra, MD      . vitamin B-12 (CYANOCOBALAMIN) tablet 1,000 mcg  1,000 mcg Oral Daily Masoudi, Elhamalsadat, MD   1,000 mcg at 03/01/19 1030  . [START ON 03/06/2019] Vitamin D (Ergocalciferol) (DRISDOL) capsule 50,000 Units  50,000 Units Oral Q Mon Masoudi, Elhamalsadat, MD         Discharge Medications: Please see discharge summary for a list of discharge medications.  Relevant Imaging Results:  Relevant Lab Results:   Additional Information SS# [id number]  Bartholomew Crews, RN

## 2019-03-01 NOTE — Plan of Care (Signed)
  Problem: Clinical Measurements: Goal: Ability to maintain clinical measurements within normal limits will improve Outcome: Progressing Goal: Will remain free from infection Outcome: Progressing Goal: Diagnostic test results will improve Outcome: Progressing Goal: Respiratory complications will improve Outcome: Progressing Goal: Cardiovascular complication will be avoided Outcome: Progressing   Problem: Activity: Goal: Risk for activity intolerance will decrease Outcome: Progressing   Problem: Coping: Goal: Level of anxiety will decrease Outcome: Progressing   Problem: Elimination: Goal: Will not experience complications related to bowel motility Outcome: Progressing Goal: Will not experience complications related to urinary retention Outcome: Progressing   Problem: Pain Managment: Goal: General experience of comfort will improve Outcome: Progressing   Problem: Safety: Goal: Ability to remain free from injury will improve Outcome: Progressing   Problem: Skin Integrity: Goal: Risk for impaired skin integrity will decrease Outcome: Progressing   Problem: Education: Goal: Knowledge of General Education information will improve Description: Including pain rating scale, medication(s)/side effects and non-pharmacologic comfort measures Outcome: Not Progressing   Problem: Health Behavior/Discharge Planning: Goal: Ability to manage health-related needs will improve Outcome: Not Progressing   Problem: Nutrition: Goal: Adequate nutrition will be maintained Outcome: Not Progressing

## 2019-03-01 NOTE — TOC Initial Note (Signed)
Transition of Care [facility]) - Initial/Assessment Note    Patient Details  Name: Nicole Blackwell MRN: [id number] Date of Birth: Aug 08, 1935  [facility] CM/SW Contact:    Bartholomew Crews, RN Phone Number: 03/01/2019, 11:04 AM  Clinical Narrative:                 Spoke with Georgeanne Nim, SW at [facility]. PACE agrees with rehab at [facility]. PACE only uses [place], [facility], and [place]. Family does not want [facility]. [facility] is not currently accepting new patients at this time. Reached out to [facility], demographics, and therapy notes uploaded. Pending call back from [facility].   Received call from [place] to inquire about progress from [facility]. Advised that NCM had not heard back from [facility]. Benita to call to offer her assistance.   TOC following for transition needs.   Expected Discharge Plan: Skilled Nursing Facility Barriers to Discharge: SNF Pending bed offer   Patient Goals and CMS Choice        Expected Discharge Plan and Services Expected Discharge Plan: [place]                                              Prior Living Arrangements/Services                       Activities of Daily Living      Permission Sought/Granted                  Emotional Assessment              Admission diagnosis:  Unsteadiness [R26.81] Suspected cerebrovascular accident (CVA) [R09.89] Patient Active Problem List   Diagnosis Date Noted  . Unsteadiness 02/28/2019  . Suspected cerebrovascular accident (CVA) 02/28/2019  . Acute encephalopathy 02/24/2019  . Lethargy 02/24/2019  . AMS (altered mental status) 12/17/2018  . Fatigue 08/27/2018  . Protein calorie malnutrition ([place]) 05/24/2018  . Hypoalbuminemia 05/23/2018  . Abnormal EEG 05/22/2018  . Dementia () 05/21/2018  . Fall 05/20/2018  . Rhabdomyolysis 05/20/2018  . Bilateral lower extremity edema 05/20/2018  . Serum total bilirubin elevated  05/20/2018  . B12 deficiency 01/17/2018  . Vascular dementia without behavioral disturbance ([place]) 01/17/2018  . Laceration of scalp without foreign body   . Chronic diastolic CHF (congestive heart failure) ([place]) 12/13/2014  . Syncope 01/12/2012  . BACK PAIN 04/30/2008  . SHINGLES 09/27/2007  . SYMPTOMATIC MENOPAUSAL/FEMALE CLIMACTERIC STATES 06/20/2007  . Osteoporosis 03/11/2007  . PLANTAR FASCIITIS 11/16/2006   PCP:  Billie Ruddy, MD Pharmacy:   [facility] [place], [place] [address] AT [address] [place] [address] Phone: [phone] Fax: [phone]     Social Determinants of Health (SDOH) Interventions    Readmission Risk Interventions No flowsheet data found.

## 2019-03-01 NOTE — Progress Notes (Signed)
Pt arrived from ED via stretcher by ED [facility]. Pt is A&OX1, breathing equal and unlabore. In NAD. Hds generalized weakness, follows command. MAE. VStaken, placed on tele. POC reviewed with pt.

## 2019-03-01 NOTE — Discharge Instructions (Signed)
You were admitted to the hospital for weakness and concerns for a possible stroke. You had multiple images of your head and brain which did not demonstrate a stroke. We believe you need additional therapy to regain your strength. We also started a medicine that can improve appetite and mood which can subsequently help you regain your strength. Please follow up with your primary care doctor within one week.

## 2019-03-01 NOTE — NC FL2 (Signed)
[place] MEDICAID FL2 LEVEL OF CARE SCREENING TOOL     IDENTIFICATION  Patient Name: Nicole Blackwell Birthdate: [birth year] Sex: female Admission Date (Current Location): 02/27/2019  [facility] and [place] Number:  Herbalist and Address:  [facility] [address] [place], [place] [address]      Provider Number: [id number]  Attending Physician Name and Address:  Lucious Groves, DO  Relative Name and Phone Number:  Oliviamarie Duensing, daughter [phone]    Current Level of Care: Hospital Recommended Level of Care: [place] Prior Approval Number:    Date Approved/Denied:   PASRR Number:    Discharge Plan:      Current Diagnoses: Patient Active Problem List   Diagnosis Date Noted  . Unsteadiness 02/28/2019  . Suspected cerebrovascular accident (CVA) 02/28/2019  . Acute encephalopathy 02/24/2019  . Lethargy 02/24/2019  . AMS (altered mental status) 12/17/2018  . Fatigue 08/27/2018  . Protein calorie malnutrition ([place]) 05/24/2018  . Hypoalbuminemia 05/23/2018  . Abnormal EEG 05/22/2018  . Dementia ([place]) 05/21/2018  . Fall 05/20/2018  . Rhabdomyolysis 05/20/2018  . Bilateral lower extremity edema 05/20/2018  . Serum total bilirubin elevated 05/20/2018  . B12 deficiency 01/17/2018  . Vascular dementia without behavioral disturbance ([place]) 01/17/2018  . Laceration of scalp without foreign body   . Chronic diastolic CHF (congestive heart failure) ([place]) 12/13/2014  . Syncope 01/12/2012  . BACK PAIN 04/30/2008  . SHINGLES 09/27/2007  . SYMPTOMATIC MENOPAUSAL/FEMALE CLIMACTERIC STATES 06/20/2007  . Osteoporosis 03/11/2007  . PLANTAR FASCIITIS 11/16/2006    Orientation RESPIRATION BLADDER Height & Weight     Self  Normal Incontinent Weight:   Height:     BEHAVIORAL SYMPTOMS/MOOD NEUROLOGICAL BOWEL NUTRITION STATUS      Continent Diet  AMBULATORY STATUS COMMUNICATION OF NEEDS Skin   Extensive Assist Verbally Normal                        Personal Care Assistance Level of Assistance  Bathing, Feeding, Dressing Bathing Assistance: Maximum assistance Feeding assistance: Limited assistance Dressing Assistance: Maximum assistance     Functional Limitations Info  Sight, Hearing, Speech Sight Info: Adequate Hearing Info: Adequate Speech Info: Adequate    SPECIAL CARE FACTORS FREQUENCY  PT (By licensed PT), OT (By licensed OT)     PT Frequency: PT to eval and treat 5 x week OT Frequency: OT to eval and treat 5 x week            Contractures Contractures Info: Not present    Additional Factors Info  Code Status, Allergies Code Status Info: Full Allergies Info: penicillin           Current Medications (03/01/2019):  This is the current hospital active medication list Current Facility-Administered Medications  Medication Dose Route Frequency Provider Last Rate Last Admin  . 0.9 %  sodium chloride infusion   Intravenous Continuous Jean Rosenthal, MD 100 mL/hr at 03/01/19 1029 New Bag at 03/01/19 1029  . acetaminophen (TYLENOL) tablet 650 mg  650 mg Oral Q6H PRN Masoudi, Elhamalsadat, MD       Or  . acetaminophen (TYLENOL) suppository 650 mg  650 mg Rectal Q6H PRN Masoudi, Elhamalsadat, MD      . aspirin EC tablet 81 mg  81 mg Oral QHS Masoudi, Elhamalsadat, MD   81 mg at 02/28/19 2111  . enoxaparin (LOVENOX) injection 40 mg  40 mg Subcutaneous Daily Masoudi, Dorthula Rue, MD      .  hydrALAZINE (APRESOLINE) injection 5 mg  5 mg Intravenous Q8H PRN Agyei, Obed K, MD      . losartan (COZAAR) tablet 50 mg  50 mg Oral Daily Agyei, Obed K, MD   50 mg at 03/01/19 1054  . mirtazapine (REMERON SOL-TAB) disintegrating tablet 15 mg  15 mg Oral QHS Al Decant, MD      . ondansetron [facility]) tablet 4 mg  4 mg Oral Q6H PRN Masoudi, Elhamalsadat, MD       Or  . ondansetron (ZOFRAN) injection 4 mg  4 mg Intravenous Q6H PRN Masoudi, Elhamalsadat, MD      . senna-docusate (Senokot-S) tablet 1 tablet  1  tablet Oral QHS PRN Masoudi, Elhamalsadat, MD      . sodium chloride flush (NS) 0.9 % injection 3 mL  3 mL Intravenous Once Tegeler, Gwenyth Allegra, MD      . vitamin B-12 (CYANOCOBALAMIN) tablet 1,000 mcg  1,000 mcg Oral Daily Masoudi, Elhamalsadat, MD   1,000 mcg at 03/01/19 1030  . [START ON 03/06/2019] Vitamin D (Ergocalciferol) (DRISDOL) capsule 50,000 Units  50,000 Units Oral Q Mon Masoudi, Elhamalsadat, MD         Discharge Medications: Please see discharge summary for a list of discharge medications.  Relevant Imaging Results:  Relevant Lab Results:   Additional Information SS# [id number]  Bartholomew Crews, RN

## 2019-03-01 NOTE — Discharge Summary (Addendum)
Name: Nicole Blackwell MRN: [id number] DOB: 11-Feb-1936 84 y.o. PCP: Billie Ruddy, MD  Date of Admission: 02/27/2019  7:31 AM Date of Discharge:  03/02/19 Attending Physician: Lucious Groves, DO  Discharge Diagnosis:  1. Weakness  Discharge Medications: Allergies as of 03/02/2019      Reactions   Penicillin G Benzathine Swelling   Did it involve swelling of the face/tongue/throat, SOB, or low BP? Unknown Did it involve sudden or severe rash/hives, skin peeling, or any reaction on the inside of your mouth or nose? Unknown Did you need to seek medical attention at a hospital or doctor's office? Unknown When did it last happen?unknown If all above answers are "NO", may proceed with cephalosporin use.   Penicillins Swelling   Did it involve swelling of the face/tongue/throat, SOB, or low BP? Unknown Did it involve sudden or severe rash/hives, skin peeling, or any reaction on the inside of your mouth or nose? Unknown Did you need to seek medical attention at a hospital or doctor's office? Unknown When did it last happen?unknown If all above answers are "NO", may proceed with cephalosporin use.      Medication List    TAKE these medications   aspirin EC 81 MG tablet Take 81 mg by mouth at bedtime.   Cholecalciferol 1.25 MG (50000 UT) capsule Take 50,000 Units by mouth every Monday.   hydroxypropyl methylcellulose / hypromellose 2.5 % ophthalmic solution Commonly known as: ISOPTO TEARS / GONIOVISC Place 1 drop into the left eye 4 (four) times daily.   levETIRAcetam 250 MG tablet Commonly known as: Keppra Take 1 tablet (250 mg total) by mouth 2 (two) times daily.   losartan 25 MG tablet Commonly known as: COZAAR TAKE 1 TABLET(25 MG) BY MOUTH DAILY What changed: See the new instructions.   mirtazapine 15 MG disintegrating tablet Commonly known as: REMERON SOL-TAB Take 1 tablet (15 mg total) by mouth at bedtime.   tobramycin 0.3 % ophthalmic solution Commonly  known as: TOBREX Place 1 drop into the left eye 2 (two) times daily.   vitamin B-12 100 MCG tablet Commonly known as: CYANOCOBALAMIN Take 100 mcg by mouth daily.       Disposition and follow-up:   [name] was discharged from [facility] in Stable condition.  At the hospital follow up visit please address:   1.  Please assess efficacy of mirtazepine in improving patient's mood and motivation.    2.  Labs / imaging needed at time of follow-up: na  3.  Pending labs/ test needing follow-up: na  Follow-up Appointments: Follow-up Information    Billie Ruddy, MD Follow up in 1 week(s).   Specialty: Family Medicine Contact information: [place] [address] [phone]        [facility] Follow up in 1 month(s).   Contact information: [address]     [place] [phone] [place] Hospital Course by problem list:  1. Weakness -pt with previous admission for weakness who declined SNF placement at discharge here with persistent weakness -pt with reports of ataxia found to have left gaze palsy on initial exam who underwent CT Head and MRIx2 without evidence of infarct; no EOM abnormalities on fu exams -EEG without seizures or epileptiform discharges but with findings suggestive of non specific bitemporal cortical dysfunction and mild to moderate diffuse encephalopathy -orthostatics soft -neuro has concerns for depression; also  reports per conversation with family that patient may be having seizures -PT ordered and recommends SNF -pt received fluids for soft orthostatics -started on mirtazepine for possible contributory depression -started on Keppra 250 bid for possible seizures, neuro fu   Discharge Vitals:   BP (!) 174/88 (BP Location: Right Arm)   Pulse 65   Temp 99.6 F (37.6 C) (Oral)   Resp 18   Wt 66 kg   SpO2 100%   BMI 21.18 kg/m   Pertinent  Labs, Studies, and Procedures:  Results for orders placed or performed during the hospital encounter of 02/27/19 (from the past 24 hour(s))  SARS CORONAVIRUS 2 (TAT 6-24 HRS) Nasopharyngeal Nasopharyngeal Swab     Status: None   Collection Time: 03/01/19 10:15 AM   Specimen: Nasopharyngeal Swab  Result Value Ref Range   SARS Coronavirus 2 NEGATIVE NEGATIVE  Glucose, capillary     Status: Abnormal   Collection Time: 03/02/19  7:11 AM  Result Value Ref Range   Glucose-Capillary 112 (H) 70 - 99 mg/dL   MR BRAIN WO CONTRAST  Result Date: 02/28/2019 CLINICAL DATA:  Vertigo.  Follow-up abnormal MRI.  Rule out infarct. EXAM: MRI HEAD WITHOUT CONTRAST TECHNIQUE: Multiplanar, multiecho pulse sequences of the brain and surrounding structures were obtained without intravenous contrast. COMPARISON:  MRI head 02/27/2019 FINDINGS: Thin-section diffusion was obtained through the brainstem to evaluate the possible abnormality on the left identified previously. On today's study, there is no evidence of restricted diffusion or infarct in the brainstem or pons. IMPRESSION: Focused diffusion-weighted imaging through the brainstem reveals no acute infarct. Electronically Signed   By: Franchot Gallo M.D.   On: 02/28/2019 11:48   MR BRAIN WO CONTRAST  Result Date: 02/27/2019 CLINICAL DATA:  Vertigo. EXAM: MRI HEAD WITHOUT CONTRAST TECHNIQUE: Multiplanar, multiecho pulse sequences of the brain and surrounding structures were obtained without intravenous contrast. COMPARISON:  Head CT 02/23/2019 FINDINGS: The study is motion degraded throughout with some sequences being moderately to severely degraded. Brain: There is a small amount of asymmetric mild trace diffusion weighted signal hyperintensity in the left pons on axial imaging with suggestion of reduced ADC, although this is an area prone to artifact. This is not confirmed on coronal diffusion imaging although assessment is limited by motion and slice selection, and no  corresponding signal abnormality is evident on conventional sequences. No acute infarct is identified elsewhere. No intracranial hemorrhage, mass, midline shift, or extra-axial fluid collection is evident. Confluent T2 hyperintensities in the cerebral white matter bilaterally are nonspecific but compatible with extensive chronic small vessel ischemic disease. There is moderate cerebral atrophy. Vascular: Major intracranial vascular flow voids are preserved. Skull and upper cervical spine: No suspicious marrow lesion. Sinuses/Orbits: Bilateral cataract extraction. Paranasal sinuses and mastoid air cells are clear. Other: None. IMPRESSION: 1. Motion degraded examination. 2. Possible small subacute infarct versus artifact in the left pons. 3. Extensive chronic small vessel ischemic disease. Electronically Signed   By: Logan Bores M.D.   On: 02/27/2019 19:23   EEG adult  Result Date: 02/28/2019 Lora Havens, MD     02/28/2019  7:00 PM Patient Name: VELERIA SAMPLE MRN: [id number] Epilepsy Attending: Lora Havens Referring Physician/Provider: Dr Zane Herald Date: 02/28/2019 Duration: 23.44 mins Patient history: ANUSKA ELSER is a 84 y.o. female with a history of hypertension who presents with staring spell and unsteadiness. EEG to evaluate for seizure Level of alertness: awake AEDs during EEG study: None Technical aspects: This EEG study was done  with scalp electrodes positioned according to the 10-20 International system of electrode placement. Electrical activity was acquired at a sampling rate of 500Hz  and reviewed with a high frequency filter of 70Hz  and a low frequency filter of 1Hz . EEG data were recorded continuously and digitally stored. DESCRIPTION: No clear posterior dominant rhythm was seen. EEG showed continuous generalized 3-6hz  theta-delta slowing, maximal bitemporal region. Hyperventilation and photic stimulation were not performed. ABNORMALITY - Continuous slow, generalized, maximal bitemporal  IMPRESSION: This study is suggestive of non specific bitemporal cortical dysfunction as well as mild to moderate diffuse encephalopathy. No seizures or epileptiform discharges were seen throughout the recording. Lora Havens   Discharge Instructions:  You were admitted to the hospital for weakness and concerns for a possible stroke. You had multiple images of your head and brain which did not demonstrate a stroke. We believe you need additional therapy to regain your strength. We also started a medicine that can improve appetite and mood which can subsequently help you regain your strength. Please follow up with your primary care doctor within one week.    Signed: Al Decant, MD 03/02/2019, 10:00 AM   Pager: [phone]

## 2019-03-01 NOTE — Progress Notes (Signed)
Subjective:  Nicole Blackwell reports she is not doing well this morning. She can't say what isn't feeling well exactly. She denies pain. She endorses weakness. We discussed our plan to get her more rehab to regain her strength. She is amenable to that plan.    Consults: neurology  Objective:  Vital signs in last 24 hours: Vitals:   03/01/19 0045 03/01/19 0117 03/01/19 0242 03/01/19 0818  BP: (!) 149/73 (!) 178/73 (!) 179/73 (!) 172/65  Pulse:  (!) 59 (!) 59 (!) 51  Resp: 16 15 18 19   Temp:   98.9 F (37.2 C) 98.3 F (36.8 C)  TempSrc:    Oral  SpO2:  100% 100% 100%   Physical Exam  Constitutional: She is well-developed, well-nourished, and in no distress. No distress.  HENT:  Head: Normocephalic and atraumatic.  Eyes: EOM are normal.  Cardiovascular: Normal rate.  No murmur heard. Pulmonary/Chest: Effort normal and breath sounds normal. No respiratory distress.  Abdominal: Soft. Bowel sounds are normal. She exhibits no distension.  Neurological: She is alert.  Pleasantly demented  Skin: Skin is warm and dry. She is not diaphoretic.  Nursing note and vitals reviewed.  I/Os: No intake or output data in the 24 hours ending 03/01/19 1010  Labs: Results for orders placed or performed during the hospital encounter of 02/27/19 (from the past 24 hour(s))  CBC     Status: Abnormal   Collection Time: 02/28/19  7:31 PM  Result Value Ref Range   WBC 4.1 4.0 - 10.5 K/uL   RBC 6.15 (H) 3.87 - 5.11 MIL/uL   Hemoglobin 15.8 (H) 12.0 - 15.0 g/dL   HCT 51.0 (H) 36.0 - 46.0 %   MCV 82.9 80.0 - 100.0 fL   MCH 25.7 (L) 26.0 - 34.0 pg   MCHC 31.0 30.0 - 36.0 g/dL   RDW 14.8 11.5 - 15.5 %   Platelets 195 150 - 400 K/uL   nRBC 0.0 0.0 - 0.2 %  Basic metabolic panel     Status: Abnormal   Collection Time: 02/28/19  7:31 PM  Result Value Ref Range   Sodium 132 (L) 135 - 145 mmol/L   Potassium 3.8 3.5 - 5.1 mmol/L   Chloride 94 (L) 98 - 111 mmol/L   CO2 26 22 - 32 mmol/L   Glucose, Bld 102  (H) 70 - 99 mg/dL   BUN 18 8 - 23 mg/dL   Creatinine, Ser 0.85 0.44 - 1.00 mg/dL   Calcium 8.5 (L) 8.9 - 10.3 mg/dL   GFR calc non Af Amer >60 >60 mL/min   GFR calc Af Amer >60 >60 mL/min   Anion gap 12 5 - 15  Hemoglobin A1c     Status: None   Collection Time: 02/28/19  7:32 PM  Result Value Ref Range   Hgb A1c MFr Bld 5.6 4.8 - 5.6 %   Mean Plasma Glucose 114.02 mg/dL  CBG monitoring, ED     Status: None   Collection Time: 02/28/19 11:41 PM  Result Value Ref Range   Glucose-Capillary 84 70 - 99 mg/dL  Glucose, capillary     Status: None   Collection Time: 03/01/19  9:51 AM  Result Value Ref Range   Glucose-Capillary 91 70 - 99 mg/dL   Imaging: MR BRAIN WO CONTRAST Result Date: 02/28/2019 CLINICAL DATA:  Vertigo.  Follow-up abnormal MRI.  Rule out infarct. EXAM: MRI HEAD WITHOUT CONTRAST TECHNIQUE: Multiplanar, multiecho pulse sequences of the brain and surrounding structures were  obtained without intravenous contrast. COMPARISON:  MRI head 02/27/2019 FINDINGS: Thin-section diffusion was obtained through the brainstem to evaluate the possible abnormality on the left identified previously. On today's study, there is no evidence of restricted diffusion or infarct in the brainstem or pons. IMPRESSION: Focused diffusion-weighted imaging through the brainstem reveals no acute infarct. Electronically Signed   By: Franchot Gallo M.D.   On: 02/28/2019 11:48   EEG adult Result Date: 02/28/2019 Lora Havens, MD     02/28/2019  7:00 PM Patient Name: Nicole Blackwell MRN: [id number] Epilepsy Attending: Lora Havens Referring Physician/Provider: Dr Zane Herald Date: 02/28/2019 Duration: 23.44 mins Patient history: Nicole Blackwell is a 84 y.o. female with a history of hypertension who presents with staring spell and unsteadiness. EEG to evaluate for seizure Level of alertness: awake AEDs during EEG study: None Technical aspects: This EEG study was done with scalp electrodes positioned according to  the 10-20 International system of electrode placement. Electrical activity was acquired at a sampling rate of 500Hz  and reviewed with a high frequency filter of 70Hz  and a low frequency filter of 1Hz . EEG data were recorded continuously and digitally stored. DESCRIPTION: No clear posterior dominant rhythm was seen. EEG showed continuous generalized 3-6hz  theta-delta slowing, maximal bitemporal region. Hyperventilation and photic stimulation were not performed. ABNORMALITY - Continuous slow, generalized, maximal bitemporal IMPRESSION: This study is suggestive of non specific bitemporal cortical dysfunction as well as mild to moderate diffuse encephalopathy. No seizures or epileptiform discharges were seen throughout the recording. Lora Havens   Assessment/Plan:  Assessment: Nicole Blackwell is an 84 yo F w/ a PMHx of Alzheimer's dementia, HFpEF (EF 60-65% in 08/2018) and HTN who presented a few days after hospital discharge for weakness that is unchanged since prior admission with reports of ataxia found to have left gaze palsy on exam for which patient underwent CT Head and MRIx2 which was negative for infarct here for SNF placement.   Plan: Active Problems:   Unsteadiness   Suspected cerebrovascular accident (CVA) -pt with previous admission for weakness who declined SNF placement at discharge here with persistent weakness -pt with reports of ataxia found to have left gaze palsy on initial exam who underwent CT Head and MRIx2 without evidence of infarct; no EOM abnormalities on fu exams -EEG unremarkable, orthostatics soft -neuro has some concerns for depression, otherwise signing off  -PT ordered and recommends SNF -TOC consulted for placement -patient has PACE who has been called regarding hospital admission and placement assistance    Plan: -fluids for soft orthostatics -mirtazapine for possible depression -aspirin 81 mg daily -fu TOC recs  Dispo: Anticipated discharge today pending bed  placement.   Al Decant, MD 03/01/2019, 10:10 AM Pager: [phone]

## 2019-03-01 NOTE — Progress Notes (Addendum)
NEUROLOGY PROGRESS NOTE  Subjective: Patient has no complaints, very slow to respond, becomes agitated to further examiner asked questions  Exam: Vitals:   03/01/19 0242 03/01/19 0818  BP: (!) 179/73 (!) 172/65  Pulse: (!) 59 (!) 51  Resp: 18 19  Temp: 98.9 F (37.2 C) 98.3 F (36.8 C)  SpO2: 100% 100%    ROS General ROS: negative for - chills, fatigue, fever, night sweats, weight gain or weight loss Psychological ROS: negative for - behavioral disorder, hallucinations, memory difficulties, mood swings or suicidal ideation Ophthalmic ROS: negative for - blurry vision, double vision, eye pain or loss of vision ENT ROS: negative for - epistaxis, nasal discharge, oral lesions, sore throat, tinnitus or vertigo Respiratory ROS: negative for - cough, hemoptysis, shortness of breath or wheezing Cardiovascular ROS: negative for - chest pain, dyspnea on exertion, edema or irregular heartbeat Gastrointestinal ROS: negative for - abdominal pain, diarrhea, hematemesis, nausea/vomiting or stool incontinence Genito-Urinary ROS: negative for - dysuria, hematuria, incontinence or urinary frequency/urgency Musculoskeletal ROS: negative for - joint swelling or muscular weakness Neurological ROS: as noted in HPI Dermatological ROS: negative for rash and skin lesion changes     Physical Exam  Constitutional: Appears well-developed and well-nourished.  Psych: Affect flat Eyes: No scleral injection HENT: No OP obstrucion Head: Normocephalic.  Cardiovascular: Normal rate and regular rhythm.  Respiratory: Effort normal, non-labored breathing GI: Soft.  No distension. There is no tenderness.  Skin: WDI   Neuro: -Of note exam was difficult as patient was somewhat reluctant to take part. Mental Status: Alert, oriented only to hospital.  When asked year, or recent holiday, or to name my finger patient quickly answered I do not know-appeared not to even attempt to try to think about what I am  asking.Marland Kitchen  Speech fluent without evidence of aphasia.  Able to follow simple commands at some point in time other times even with multiple attempts to have her follow commands she would not even though she could repeat the command I was asking her.   Cranial Nerves: II:  Visual fields grossly normal,  III,IV, VI: ptosis not present, extra-ocular motions intact bilaterally pupils equal, round, reactive to light and accommodation V,VII: smile symmetric, facial light touch sensation normal bilaterally VIII: hearing normal bilaterally  Motor: Right : Upper extremity   5/5    Left:     Upper extremity   5/5  Lower extremity   5/5     Lower extremity   5/5 Tone and bulk:normal tone throughout; no atrophy noted Sensory: Pinprick and light touch intact throughout, bilaterally Deep Tendon Reflexes: 2+ and symmetric throughout upper extremities at this point patient became frustrated with exam and would not relax for knee jerks Plantars: Right: downgoing   Left: downgoing Cerebellar: normal finger-to-nose,     Medications:  Scheduled: . aspirin EC  81 mg Oral QHS  . enoxaparin (LOVENOX) injection  40 mg Subcutaneous Daily  . losartan  25 mg Oral Daily  . sodium chloride flush  3 mL Intravenous Once  . vitamin B-12  1,000 mcg Oral Daily  . [START ON 03/06/2019] Vitamin D (Ergocalciferol)  50,000 Units Oral Q Mon   Continuous:  [id number] **OR** acetaminophen, ondansetron **OR** ondansetron (ZOFRAN) IV, senna-docusate  Pertinent Labs/Diagnostics:   MR BRAIN WO CONTRAST Result Date: 02/28/2019 . IMPRESSION: Focused diffusion-weighted imaging through the brainstem reveals no acute infarct. Electronically Signed   By: Franchot Gallo M.D.   On: 02/28/2019 11:48   MR BRAIN WO CONTRAST Result  Date: 02/27/2019 . IMPRESSION: 1. Motion degraded examination. 2. Possible small subacute infarct versus artifact in the left pons. 3. Extensive chronic small vessel ischemic disease. Electronically  Signed   By: Logan Bores M.D.   On: 02/27/2019 19:23   EEG adult Result Date: 02/28/2019 l IMPRESSION: This study is suggestive of non specific bitemporal cortical dysfunction as well as mild to moderate diffuse encephalopathy. No seizures or epileptiform discharges were seen throughout the recording. Priyanka Cipriano Mile PA-C [facility] [phone]   Impression: 84 year old female who was initially brought to the hospital for staring spells and episodes of weakness.    Today's exam showed no weakness and all 4 extremities however still remains slow to respond and some difficulty following commands.  Patient's orthostatics were more positive however stated in H&P they were technically positive on standing at 0 minutes.  Ideally they should be repeated to evaluate after 5 minutes.   Repeat MRI showed no acute stroke.  As above stroke and seizure work-up were negative, given patient's affect I do question if there is also a component of depression involved along with patient's dementia.  Addendum: Further discussion with the daughter patient has been having these staring spells for approximately a year that last for about 5 to 10 minutes.  During these spells patient has right hand twitching along with possibly drawing up of the right face.  She is slightly slow to come around.  Although EEG was negative there is concerned that these could be seizures.  As far as her gait instability.  The daughter states that on a daily basis usually sits down throughout the day and possibly gets up to go to the bathroom.  This could definitely be a component of deconditioning.  Recommendations: -PT/OT -Given history of staring spells by daughter and also patient has history of dementia which does lower seizure threshold would prefer to start patient on a trial of Keppra 250 mg twice daily and may increase to 500 mg twice daily after 1 week with neurology follow-up -No further neurological  work-up recommended. -At this point neurology will sign off.  Any further questions please call us    03/01/2019, 8:41 AM  NEUROHOSPITALIST ADDENDUM Performed a face to face diagnostic evaluation.   I have reviewed the contents of history and physical exam as documented by PA/ARNP/Resident and agree with above documentation.  I have discussed and formulated the above plan as documented. Edits to the note have been made as needed.  Patient brought to emergency room due to ongoing weakness, evaluate as possible code stroke for mild decreased nasolabial fold and possibly mild dysmetria on the left. MRI brain negative for acute stroke, reviewed repeat MRI brain with thin cuts: No small brainstem infarct. Dizziness, gait instability likely due to dehydration with positive orthostatic vitals.  Also deconditioning may be playing a role as patient not very ambulatory at home.    Also reported to have spells of altered awareness. Attempted to call daughter and confirm description of spells, however is not able to reach the daughter.  Per PA Etta Quill, spells of staring characterized by twitching in the right hand as well as drawing up of the right base followed by confusion afterwards.  Given history of dementia, she is at risk for seizures.  Routine EEG is negative however this can be normal and up to 50% of the time in patient with seizures. Would consider empiric Keppra 250 mg twice daily and if well-tolerated  could increase divided twice daily.  Outpatient neurology follow-up. Neurology will be available as needed.  Agree with PT OT evaluation   Karena Addison Ovadia Lopp MD [facility] [phone]   If 7pm to 7am, please call on call as listed on AMION.

## 2019-03-01 NOTE — TOC Progression Note (Signed)
Transition of Care [facility]) - Progression Note    Patient Details  Name: Nicole Blackwell MRN: [id number] Date of Birth: [birth year]  Transition of [facility]) CM/SW Contact  Bartholomew Crews, RN Phone Number: [phone] 03/01/2019, 4:30 PM  Clinical Narrative:    Notified by Mendel Corning that bed request was still pending leadership decision. Spoke with Benita at [phone] to discuss update. [place] are working out the contract details and anticipate resolution in the morning. TOC continuing to follow for transition needs.    Expected Discharge Plan: Skilled Nursing Facility Barriers to Discharge: SNF Pending bed offer  Expected Discharge Plan and Services Expected Discharge Plan: [place]                                               Social Determinants of Health (SDOH) Interventions    Readmission Risk Interventions No flowsheet data found.

## 2019-03-01 NOTE — Evaluation (Signed)
Occupational Therapy Evaluation Patient Details Name: Nicole Blackwell MRN: [id number] DOB: 02-05-1936 Today's Date: 03/01/2019    History of Present Illness 84 yo female admitted to ED on 1/4 for unsteadiness and inability to ambulate, pt with recet d/c from ED on 1/3 for weakness and AMS. MRI of brain reveals possible small subacute infarct vs artifact in L pon, neurology following. Per neurology, MRI brain negative for acute infarction 02/27/18, and EEG negative. PMH includes HTN, plantar fasciitis, hx syncope, CA, vascular dementia, osteoporosis, CHF.   Clinical Impression   PTA patient reports independent with ADLs, mobility living with her spouse, but unsure of accuracy of history due to dementia hx.  Patient admitted for above and is limited by problem list below, including impaired balance, generalized weakness, decreased activity tolerance, and impaired cognition.  She is oriented to self and place, follows 1 step command with increased time, and has poor awareness/recall.  She requires min-max assist for ADLs, max assist for mobility.  She will benefit from continued OT services while admitted and after dc at SNF level in order to decrease burden of care and optimize independence with ADLs/mobility.     Follow Up Recommendations  SNF;Supervision/Assistance - 24 hour    Equipment Recommendations  Other (comment)(TBD at next venue of care)    Recommendations for Other Services       Precautions / Restrictions Precautions Precautions: Fall Restrictions Weight Bearing Restrictions: No      Mobility Bed Mobility Overal bed mobility: Needs Assistance Bed Mobility: Supine to Sit;Sit to Supine     Supine to sit: Max assist;HOB elevated Sit to supine: Max assist   General bed mobility comments: max assist to transition to/from EOB, able to initate but requires max physical support to complete movement and maintain balance  Transfers Overall transfer level: Needs  assistance Equipment used: Rolling walker (2 wheeled) Transfers: Sit to/from Stand Sit to Stand: Max assist;From elevated surface         General transfer comment: max assist to power up and steady from elevated EOB with heavy posterior lean and limited tolerance for upright posture     Balance Overall balance assessment: Needs assistance Sitting-balance support: Feet supported;No upper extremity supported;Bilateral upper extremity supported Sitting balance-Leahy Scale: Poor Sitting balance - Comments: posterior a R lateral lean initally requiring min-mod assist, fading to min guard with constant cueing for foward lean Postural control: Posterior lean;Right lateral lean Standing balance support: Bilateral upper extremity supported;During functional activity Standing balance-Leahy Scale: Poor Standing balance comment: reliant on external support in standing, heavy posterior leaning                           ADL either performed or assessed with clinical judgement   ADL Overall ADL's : Needs assistance/impaired     Grooming: Minimal assistance;Sitting;Wash/dry face   Upper Body Bathing: Minimal assistance;Sitting   Lower Body Bathing: Maximal assistance;Sitting/lateral leans;Bed level   Upper Body Dressing : Moderate assistance;Sitting   Lower Body Dressing: Total assistance;Sitting/lateral leans     Toilet Transfer Details (indicate cue type and reason): unable to attempt d/t safety    Toileting - Clothing Manipulation Details (indicate cue type and reason): total assist to change pad bed level, soiled with urine     Functional mobility during ADLs: Maximal assistance General ADL Comments: pt limited by generalized weakness, impaired cognition, decreased activity tolerance and balance      Vision Baseline Vision/History: No visual deficits Patient Visual  Report: No change from baseline Vision Assessment?: No apparent visual deficits Additional Comments:  able to locate number of fingers in all planes     Perception     Praxis      Pertinent Vitals/Pain Pain Assessment: No/denies pain     Hand Dominance     Extremity/Trunk Assessment Upper Extremity Assessment Upper Extremity Assessment: Generalized weakness   Lower Extremity Assessment Lower Extremity Assessment: Defer to PT evaluation   Cervical / Trunk Assessment Cervical / Trunk Assessment: Normal   Communication Communication Communication: No difficulties   Cognition Arousal/Alertness: Awake/alert Behavior During Therapy: Flat affect Overall Cognitive Status: History of cognitive impairments - at baseline Area of Impairment: Orientation;Attention;Memory;Following commands;Safety/judgement;Awareness;Problem solving                 Orientation Level: Disoriented to;Situation;Time Current Attention Level: Sustained Memory: Decreased recall of precautions;Decreased short-term memory Following Commands: Follows one step commands consistently;Follows one step commands with increased time Safety/Judgement: Decreased awareness of safety;Decreased awareness of deficits Awareness: Intellectual Problem Solving: Slow processing;Decreased initiation;Difficulty sequencing;Requires verbal cues;Requires tactile cues General Comments: pt oriented to self and place, but unaware of situation; hx of dementia at baseline but able to follow 1 step commands with increased time    General Comments       Exercises     Shoulder Instructions      Home Living Family/patient expects to be discharged to:: Private residence Living Arrangements: Children;Spouse/significant other Available Help at Discharge: Family Type of Home: House Home Access: Stairs to enter [employer] of Steps: a few                       Additional Comments: per PT evaluation, pt able to report living with her spouse but otherwise unable to provide home setup       Prior  Functioning/Environment Level of Independence: Needs assistance  Gait / Transfers Assistance Needed: reports independent with mobility  ADL's / Homemaking Assistance Needed: reports independent with ADLs    Comments: poor historian with dementia at baseline         OT Problem List: Decreased strength;Decreased activity tolerance;Impaired balance (sitting and/or standing);Decreased safety awareness;Decreased cognition;Decreased knowledge of use of DME or AE;Decreased knowledge of precautions;Decreased coordination      OT Treatment/Interventions: Self-care/ADL training;DME and/or AE instruction;Therapeutic activities;Cognitive remediation/compensation;Patient/family education;Balance training;Therapeutic exercise    OT Goals(Current goals can be found in the care plan section) Acute Rehab OT Goals Patient Stated Goal: none stated OT Goal Formulation: Patient unable to participate in goal setting Time For Goal Achievement: 03/15/19 Potential to Achieve Goals: Good  OT Frequency: Min 2X/week   Barriers to D/C:            Co-evaluation              AM-PAC OT "6 Clicks" Daily Activity     Outcome Measure Help from another person eating meals?: A Little Help from another person taking care of personal grooming?: A Little Help from another person toileting, which includes using toliet, bedpan, or urinal?: Total Help from another person bathing (including washing, rinsing, drying)?: A Lot Help from another person to put on and taking off regular upper body clothing?: A Lot Help from another person to put on and taking off regular lower body clothing?: A Lot 6 Click Score: 13   End of Session Equipment Utilized During Treatment: Gait belt;Rolling walker Nurse Communication: Mobility status  Activity Tolerance: Patient tolerated treatment well Patient left: in bed;with  call bell/phone within reach;with bed alarm set  OT Visit Diagnosis: Other abnormalities of gait and mobility  (R26.89);Muscle weakness (generalized) (M62.81);Other symptoms and signs involving cognitive function                Time: [id number] OT Time Calculation (min): 18 min Charges:  OT General Charges $OT Visit: 1 Visit OT Evaluation $OT Eval Moderate Complexity: 1 Mod  Jolaine Artist, OT Acute Rehabilitation Services Pager [phone] Office [phone]    Delight Stare 03/01/2019, 12:13 PM

## 2019-03-02 LAB — CBC WITH DIFFERENTIAL/PLATELET
Abs Immature Granulocytes: 0.02 10*3/uL (ref 0.00–0.07)
Basophils Absolute: 0 10*3/uL (ref 0.0–0.1)
Basophils Relative: 0 %
Eosinophils Absolute: 0 10*3/uL (ref 0.0–0.5)
Eosinophils Relative: 0 %
HCT: 50.6 % — ABNORMAL HIGH (ref 36.0–46.0)
Hemoglobin: 16 g/dL — ABNORMAL HIGH (ref 12.0–15.0)
Immature Granulocytes: 1 %
Lymphocytes Relative: 45 %
Lymphs Abs: 2 10*3/uL (ref 0.7–4.0)
MCH: 25.6 pg — ABNORMAL LOW (ref 26.0–34.0)
MCHC: 31.6 g/dL (ref 30.0–36.0)
MCV: 81 fL (ref 80.0–100.0)
Monocytes Absolute: 0.3 10*3/uL (ref 0.1–1.0)
Monocytes Relative: 7 %
Neutro Abs: 2.1 10*3/uL (ref 1.7–7.7)
Neutrophils Relative %: 47 %
Platelets: 193 10*3/uL (ref 150–400)
RBC: 6.25 MIL/uL — ABNORMAL HIGH (ref 3.87–5.11)
RDW: 14.4 % (ref 11.5–15.5)
WBC: 4.4 10*3/uL (ref 4.0–10.5)
nRBC: 0 % (ref 0.0–0.2)

## 2019-03-02 LAB — COMPREHENSIVE METABOLIC PANEL
ALT: 29 U/L (ref 0–44)
AST: 44 U/L — ABNORMAL HIGH (ref 15–41)
Albumin: 3.3 g/dL — ABNORMAL LOW (ref 3.5–5.0)
Alkaline Phosphatase: 74 U/L (ref 38–126)
Anion gap: 11 (ref 5–15)
BUN: 8 mg/dL (ref 8–23)
CO2: 26 mmol/L (ref 22–32)
Calcium: 8.5 mg/dL — ABNORMAL LOW (ref 8.9–10.3)
Chloride: 98 mmol/L (ref 98–111)
Creatinine, Ser: 0.79 mg/dL (ref 0.44–1.00)
GFR calc Af Amer: >60 mL/min (ref >60)
GFR calc non Af Amer: >60 mL/min (ref >60)
Glucose, Bld: 108 mg/dL — ABNORMAL HIGH (ref 70–99)
Potassium: 4.1 mmol/L (ref 3.5–5.1)
Sodium: 135 mmol/L (ref 135–145)
Total Bilirubin: 0.5 mg/dL (ref 0.3–1.2)
Total Protein: 6.2 g/dL — ABNORMAL LOW (ref 6.5–8.1)

## 2019-03-02 LAB — GLUCOSE, CAPILLARY: Glucose-Capillary: 112 mg/dL — ABNORMAL HIGH (ref 70–99)

## 2019-03-02 LAB — PATHOLOGIST SMEAR REVIEW

## 2019-03-02 MED ORDER — LEVETIRACETAM 250 MG PO TABS
250.0000 mg | ORAL_TABLET | Freq: Two times a day (BID) | ORAL | Status: DC
  Filled 2019-03-02: qty 1

## 2019-03-02 MED ORDER — LEVETIRACETAM 250 MG PO TABS
250.0000 mg | ORAL_TABLET | Freq: Two times a day (BID) | ORAL | Status: DC
  Administered 2019-03-02 – 2019-03-03 (×3): 250 mg via ORAL
  Filled 2019-03-02 (×4): qty 1

## 2019-03-02 MED ORDER — DEXTROSE-NACL 5-0.45 % IV SOLN: INTRAVENOUS | Status: AC

## 2019-03-02 NOTE — TOC Progression Note (Addendum)
Transition of Care [facility]) - Progression Note    Patient Details  Name: Nicole Blackwell MRN: [id number] Date of Birth: 08-Dec-1935  Transition of Care [facility]) CM/SW Contact  Sharin Mons, RN Phone Number: [phone] 03/02/2019, 11:14 AM  Clinical Narrative:    Per MD pt medically ready for next LOC, SNF/ rehab. Authorization inplace. Awaiting response back from [place] for bed readiness. NCM to f/u.  17/2021 @ 1:27 pm Pt with lethargy, unclear etiology per MD. Pt started on new medication ( mirtazepine),03/01/2019 ... MD postponing d/c for today.    Expected Discharge Plan: [place] Barriers to Discharge: No Barriers Identified(Awaiting call from [facility] for bed readiness)  Expected Discharge Plan and Services Expected Discharge Plan: [place]                                               Social Determinants of Health (SDOH) Interventions    Readmission Risk Interventions No flowsheet data found.

## 2019-03-02 NOTE — Progress Notes (Signed)
Subjective:    Pt seen at the bedside this AM. Very sleepy on exam but does answer yes no questions. States that she feels fine.   Consults: neurology  Objective:  Vital signs in last 24 hours: Vitals:   03/01/19 2248 03/01/19 2306 03/02/19 0427 03/02/19 1000  BP: (!) 188/87 (!) 174/88  138/67  Pulse: 65   60  Resp: 18   16  Temp: 99.6 F (37.6 C)   99.2 F (37.3 C)  TempSrc: Oral   Oral  SpO2: 100%   100%  Weight:   66 kg    Physical Exam  Constitutional: She is well-developed, well-nourished, and in no distress. No distress.  HENT:  Head: Normocephalic and atraumatic.  Eyes: EOM are normal.  Cardiovascular: Normal rate.  No murmur heard. Pulmonary/Chest: Effort normal and breath sounds normal. No respiratory distress.  Abdominal: Soft. Bowel sounds are normal. She exhibits no distension.  Neurological: She is alert.  Very lethargic  Skin: Skin is warm and dry. She is not diaphoretic.  Nursing note and vitals reviewed.  I/Os:  Intake/Output Summary (Last 24 hours) at 03/02/2019 1352 Last data filed at 03/02/2019 0131 Gross per 24 hour  Intake 610.48 ml  Output 2400 ml  Net -1789.52 ml    Labs: Results for orders placed or performed during the hospital encounter of 02/27/19 (from the past 24 hour(s))  Glucose, capillary     Status: Abnormal   Collection Time: 03/02/19  7:11 AM  Result Value Ref Range   Glucose-Capillary 112 (H) 70 - 99 mg/dL  CBC with Differential/Platelet     Status: Abnormal   Collection Time: 03/02/19 12:30 PM  Result Value Ref Range   WBC 4.4 4.0 - 10.5 K/uL   RBC 6.25 (H) 3.87 - 5.11 MIL/uL   Hemoglobin 16.0 (H) 12.0 - 15.0 g/dL   HCT 50.6 (H) 36.0 - 46.0 %   MCV 81.0 80.0 - 100.0 fL   MCH 25.6 (L) 26.0 - 34.0 pg   MCHC 31.6 30.0 - 36.0 g/dL   RDW 14.4 11.5 - 15.5 %   Platelets 193 150 - 400 K/uL   nRBC 0.0 0.0 - 0.2 %   Neutrophils Relative % 47 %   Neutro Abs 2.1 1.7 - 7.7 K/uL   Lymphocytes Relative 45 %   Lymphs Abs 2.0 0.7  - 4.0 K/uL   Monocytes Relative 7 %   Monocytes Absolute 0.3 0.1 - 1.0 K/uL   Eosinophils Relative 0 %   Eosinophils Absolute 0.0 0.0 - 0.5 K/uL   Basophils Relative 0 %   Basophils Absolute 0.0 0.0 - 0.1 K/uL   Immature Granulocytes 1 %   Abs Immature Granulocytes 0.02 0.00 - 0.07 K/uL   Assessment/Plan:  Assessment: Ms. Tungate is an 84 yo F w/ a PMHx of Alzheimer's dementia, HFpEF (EF 60-65% in 08/2018) and HTN who presented a few days after hospital discharge for weakness that is unchanged since prior admission with reports of ataxia found to have left gaze palsy on exam for which patient underwent CT Head and MRIx2 which was negative for infarct here for SNF placement.   Plan: Active Problems:   Unsteadiness   Suspected cerebrovascular accident (CVA)   Orthostatic hypotension -pt with previous admission for weakness who declined SNF placement at discharge here with persistent weakness -pt with reports of ataxia found to have left gaze palsy on initial exam who underwent CT Head and MRIx2 without evidence of infarct; no EOM abnormalities on  fu exams -EEG unremarkable, orthostatics soft -neuro has some concerns for depression and seizures per hx from family, recommending keppra 250 BID and neuro follow up  -mirtazapine started last night due to concerns for depression -PT ordered and recommends SNF -TOC consulted for placement -patient has [facility] who has been called regarding hospital admission and placement assistance  -lethargic today, unclear etiology, VSS and labs unremarkable, cmp still pending -dc'ing mirtazapine as this is the only real change since yesterday   Plan: -dc mirtazepine -keppra per neuro -aspirin 81 mg daily -fu TOC recs  Dispo: Anticipated discharge today pending bed placement.   Al Decant, MD 03/02/2019, 1:52 PM Pager: [phone]

## 2019-03-03 ENCOUNTER — Inpatient Hospital Stay (HOSPITAL_COMMUNITY): Payer: Medicare (Managed Care)

## 2019-03-03 DIAGNOSIS — Z7982 Long term (current) use of aspirin: Secondary | ICD-10-CM

## 2019-03-03 LAB — URINALYSIS, ROUTINE W REFLEX MICROSCOPIC
Bilirubin Urine: NEGATIVE
Glucose, UA: NEGATIVE mg/dL
Hgb urine dipstick: NEGATIVE
Ketones, ur: NEGATIVE mg/dL
Leukocytes,Ua: NEGATIVE
Nitrite: NEGATIVE
Protein, ur: 30 mg/dL — AB
Specific Gravity, Urine: 1.021 (ref 1.005–1.030)
pH: 6 (ref 5.0–8.0)

## 2019-03-03 LAB — CBC
HCT: 50 % — ABNORMAL HIGH (ref 36.0–46.0)
Hemoglobin: 16.3 g/dL — ABNORMAL HIGH (ref 12.0–15.0)
MCH: 26.5 pg (ref 26.0–34.0)
MCHC: 32.6 g/dL (ref 30.0–36.0)
MCV: 81.2 fL (ref 80.0–100.0)
Platelets: 192 10*3/uL (ref 150–400)
RBC: 6.16 MIL/uL — ABNORMAL HIGH (ref 3.87–5.11)
RDW: 14.6 % (ref 11.5–15.5)
WBC: 4.5 10*3/uL (ref 4.0–10.5)
nRBC: 0 % (ref 0.0–0.2)

## 2019-03-03 LAB — BASIC METABOLIC PANEL
Anion gap: 9 (ref 5–15)
BUN: 10 mg/dL (ref 8–23)
CO2: 27 mmol/L (ref 22–32)
Calcium: 8.6 mg/dL — ABNORMAL LOW (ref 8.9–10.3)
Chloride: 100 mmol/L (ref 98–111)
Creatinine, Ser: 0.83 mg/dL (ref 0.44–1.00)
GFR calc Af Amer: >60 mL/min (ref >60)
GFR calc non Af Amer: >60 mL/min (ref >60)
Glucose, Bld: 101 mg/dL — ABNORMAL HIGH (ref 70–99)
Potassium: 3.9 mmol/L (ref 3.5–5.1)
Sodium: 136 mmol/L (ref 135–145)

## 2019-03-03 LAB — GLUCOSE, CAPILLARY: Glucose-Capillary: 99 mg/dL (ref 70–99)

## 2019-03-03 IMAGING — DX DG CHEST 1V PORT
1 series · 1 of 1 positions shown · non-contrast
Comparison: Chest radiograph [DATE]

CLINICAL DATA: Fever.

EXAM:
PORTABLE CHEST 1 VIEW

[chest ap]
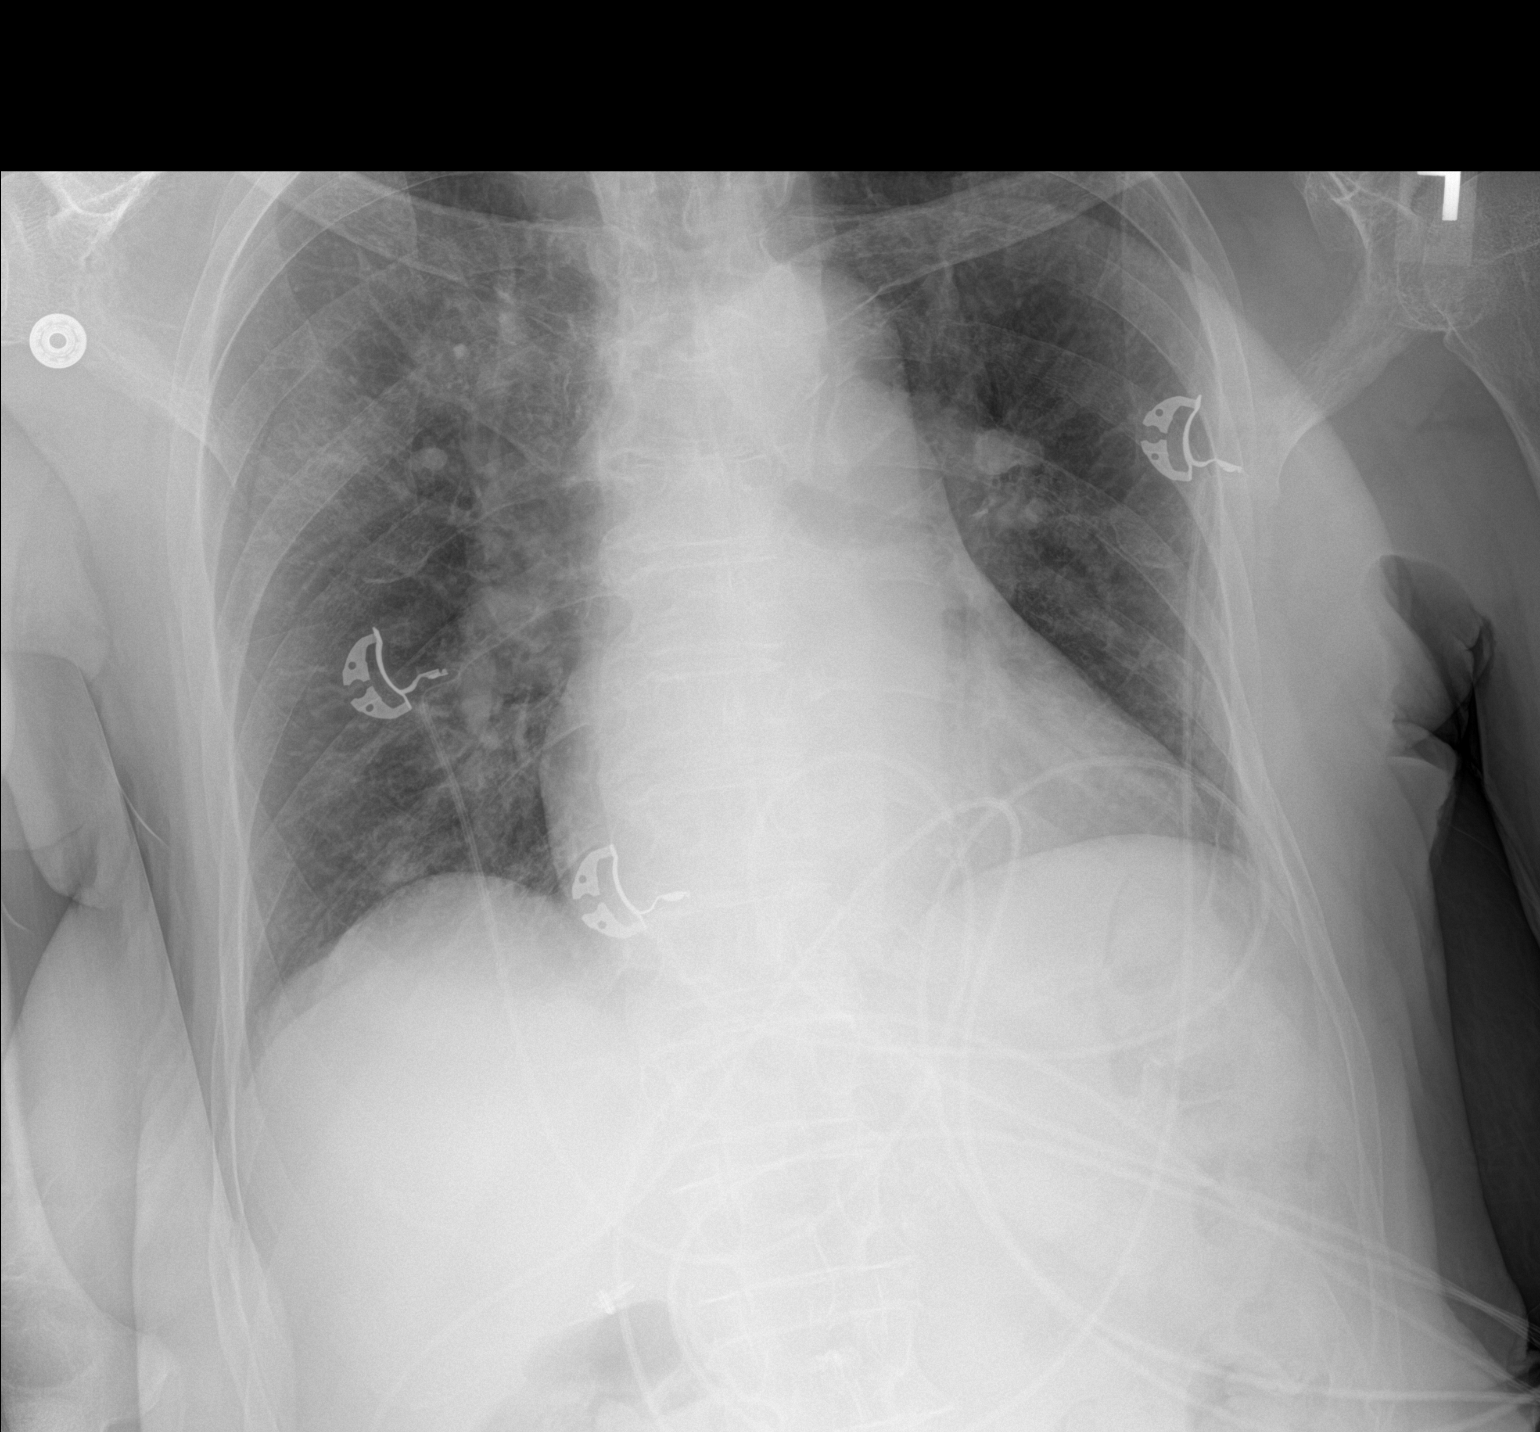

[1 of 1 positions shown; findings below may reference images not displayed]

FINDINGS: Monitoring leads overlie the patient. Stable enlarged cardiac and
mediastinal contours. Patchy opacities within the right mid and
upper lung. No pleural effusion or pneumothorax. Thoracic spine
degenerative changes.
IMPRESSION: Patchy opacities right mid and upper lung may represent asymmetric
edema or infection in the appropriate clinical setting. Consider PA
and lateral chest radiograph as patient clinically able.

## 2019-03-03 NOTE — Progress Notes (Signed)
Physical Therapy Treatment Patient Details Name: Nicole Blackwell MRN: [id number] DOB: [birth year] Today's Date: 03/03/2019    History of Present Illness Pt is an 84 y.o. female admitted to ED on 02/27/19 for unsteadiness and inability to ambulate; of note, recent d/c from ED on 1/3 for weakness and AMS. MRI brain reveals possible small subacute infarct vs artifact in L pon, neurology following. Per neurology, MRI brain negative for acute infarction 02/27/18, and EEG negative. PMH includes HTN, plantar fasciitis, hx syncope, CA, vascular dementia, osteoporosis, CHF.   PT Comments    Pt with increased lethargy this session, eyes closed despite max multimodal stimuli; only opening eyes and accepting bites of meds/applesauce from RN once pt assisted to EOB requiring totalA. Pt not consistently answering questions or following simple commands. Continue to recommend SNF-level therapies to maximize functional mobility and independence prior to return home.   Follow Up Recommendations  SNF;Supervision/Assistance - 24 hour     Equipment Recommendations  (defer)    Recommendations for Other Services       Precautions / Restrictions Precautions Precautions: Fall Restrictions Weight Bearing Restrictions: No    Mobility  Bed Mobility Overal bed mobility: Needs Assistance Bed Mobility: Supine to Sit;Sit to Supine     Supine to sit: Total assist;HOB elevated Sit to supine: Total assist   General bed mobility comments: Pt not following commands or opening eyes with bed mobility; reacting to painful/cold stimuli but still keeping eyes closed. TotalA to sit EOB and pt finally opening eyes and accepting bite of applesauce/meds from nurse with max cues. TotalA for return to supine and repositioning, keeping eyes open but not consistently interacting  Transfers                 General transfer comment: NT due to level of arousal, not consistently following commands and requiring  totalA  Ambulation/Gait                 Stairs             Wheelchair Mobility    Modified Rankin (Stroke Patients Only)       Balance Overall balance assessment: Needs assistance Sitting-balance support: Feet supported;No upper extremity supported;Bilateral upper extremity supported Sitting balance-Leahy Scale: Zero                                      Cognition Arousal/Alertness: Lethargic Behavior During Therapy: Flat affect Overall Cognitive Status: Difficult to assess Area of Impairment: Orientation;Attention;Memory;Following commands;Safety/judgement;Awareness;Problem solving                 Orientation Level: Disoriented to;Place;Time;Situation Current Attention Level: Focused   Following Commands: Follows one step commands inconsistently   Awareness: Intellectual   General Comments: Pt oriented to self, and states "yes" when asked in [place]. Not consistently answering questions or following commands; eyes closed despite max, multimodal stimulation. Finally opening eyes and opening mouth for meds once sitting EOB with totalA      Exercises      General Comments General comments (skin integrity, edema, etc.): At end of session with return to supine, pt able to flex both knees well and bridge hips minimally to reposition in bed; would not repeat when cued      Pertinent Vitals/Pain Pain Assessment: Faces Pain Location: Denies pain; responds to painful stimuli (sternal rub) Pain Intervention(s): Monitored during session    Home Living  Prior Function            PT Goals (current goals can now be found in the care plan section) Progress towards PT goals: Not progressing toward goals - comment(limited by lethargy)    Frequency    Min 2X/week      PT Plan Current plan remains appropriate    Co-evaluation              AM-PAC PT "6 Clicks" Mobility   Outcome Measure  Help  needed turning from your back to your side while in a flat bed without using bedrails?: A Lot Help needed moving from lying on your back to sitting on the side of a flat bed without using bedrails?: Total Help needed moving to and from a bed to a chair (including a wheelchair)?: Total Help needed standing up from a chair using your arms (e.g., wheelchair or bedside chair)?: Total Help needed to walk in hospital room?: Total Help needed climbing 3-5 steps with a railing? : Total 6 Click Score: 7    End of Session   Activity Tolerance: Patient limited by lethargy Patient left: in bed;with call bell/phone within reach;with bed alarm set Nurse Communication: Mobility status       Time: [id number] PT Time Calculation (min) (ACUTE ONLY): 15 min  Charges:  $Therapeutic Activity: 8-22 mins                    Mabeline Caras, PT, DPT Acute Rehabilitation Services  Pager [phone] Office [phone]  Derry Lory 03/03/2019, 8:56 AM

## 2019-03-03 NOTE — TOC Transition Note (Signed)
Transition of Care [facility]) - CM/SW Discharge Note   Patient Details  Name: KADIAN MURREY MRN: [id number] Date of Birth: 09-Apr-1935  Transition of Care [facility]) CM/SW Contact:  Sharin Mons, RN Phone Number: [phone] 03/03/2019, 1:01 PM   Clinical Narrative:     Patient will DC to: [facility] Anticipated DC date: 03/03/2019 Family notified: [name] ( daughter) Transport [id number]   Per MD patient ready for DC today  To [facility] . RN, patient, patient's family, and facility notified of DC. Discharge Summary and FL2 sent to facility. RN to call report prior to discharge [phone]  ). DC packet on chart. Ambulance transport requested for patient.   RNCM will sign off for now as intervention is no longer needed. Please consult [place] again if new needs arise.   Final next level of care: Skilled Nursing Facility Barriers to Discharge: No Barriers Identified   Patient Goals and CMS Choice        Discharge Placement                       Discharge Plan and Services                                     Social Determinants of Health (SDOH) Interventions     Readmission Risk Interventions No flowsheet data found.

## 2019-03-03 NOTE — Care Management Important Message (Signed)
Important Message  Patient Details  Name: Nicole Blackwell MRN: [id number] Date of Birth: 04/11/1935   Medicare Important Message Given:  Yes     Orbie Pyo 03/03/2019, 3:22 PM

## 2019-03-03 NOTE — Progress Notes (Signed)
Report given to Research scientist (medical) at [facility].

## 2019-03-03 NOTE — Telephone Encounter (Signed)
Pt started seeing [facility].

## 2019-03-03 NOTE — Progress Notes (Addendum)
Subjective:    Pt seen at the bedside this AM. Answers questions appropriately. She denies any SOB, cough, dyuria, urinary frequency or urgency. Pt states she feels well and doesn't need anything.   Objective:  Vital signs in last 24 hours: Vitals:   03/02/19 1000 03/02/19 1502 03/02/19 2100 03/03/19 0752  BP: 138/67 (!) 142/76 (!) 157/82 (!) 143/74  Pulse: 60 73 61 61  Resp: 16 14 15 15   Temp: 99.2 F (37.3 C) (!) 101.2 F (38.4 C) 98.9 F (37.2 C) 98.1 F (36.7 C)  TempSrc: Oral Oral Oral Oral  SpO2: 100% 100% 98% 100%  Weight:       Physical Exam  Constitutional: She is well-developed, well-nourished, and in no distress. No distress.  HENT:  Head: Normocephalic and atraumatic.  Eyes: EOM are normal.  Cardiovascular: Normal rate.  No murmur heard. Pulmonary/Chest: Effort normal and breath sounds normal. No respiratory distress.  Abdominal: Soft. She exhibits no distension.  Neurological: She is alert.  Sleep but answers questions appropriately  Skin: Skin is warm and dry. She is not diaphoretic.  Nursing note and vitals reviewed.  I/Os: No intake or output data in the 24 hours ending 03/03/19 1222  Labs: Results for orders placed or performed during the hospital encounter of 02/27/19 (from the past 24 hour(s))  Comprehensive metabolic panel     Status: Abnormal   Collection Time: 03/02/19 12:30 PM  Result Value Ref Range   Sodium 135 135 - 145 mmol/L   Potassium 4.1 3.5 - 5.1 mmol/L   Chloride 98 98 - 111 mmol/L   CO2 26 22 - 32 mmol/L   Glucose, Bld 108 (H) 70 - 99 mg/dL   BUN 8 8 - 23 mg/dL   Creatinine, Ser 0.79 0.44 - 1.00 mg/dL   Calcium 8.5 (L) 8.9 - 10.3 mg/dL   Total Protein 6.2 (L) 6.5 - 8.1 g/dL   Albumin 3.3 (L) 3.5 - 5.0 g/dL   AST 44 (H) 15 - 41 U/L   ALT 29 0 - 44 U/L   Alkaline Phosphatase 74 38 - 126 U/L   Total Bilirubin 0.5 0.3 - 1.2 mg/dL   GFR calc non Af Amer >60 >60 mL/min   GFR calc Af Amer >60 >60 mL/min   Anion gap 11 5 - 15    CBC with Differential/Platelet     Status: Abnormal   Collection Time: 03/02/19 12:30 PM  Result Value Ref Range   WBC 4.4 4.0 - 10.5 K/uL   RBC 6.25 (H) 3.87 - 5.11 MIL/uL   Hemoglobin 16.0 (H) 12.0 - 15.0 g/dL   HCT 50.6 (H) 36.0 - 46.0 %   MCV 81.0 80.0 - 100.0 fL   MCH 25.6 (L) 26.0 - 34.0 pg   MCHC 31.6 30.0 - 36.0 g/dL   RDW 14.4 11.5 - 15.5 %   Platelets 193 150 - 400 K/uL   nRBC 0.0 0.0 - 0.2 %   Neutrophils Relative % 47 %   Neutro Abs 2.1 1.7 - 7.7 K/uL   Lymphocytes Relative 45 %   Lymphs Abs 2.0 0.7 - 4.0 K/uL   Monocytes Relative 7 %   Monocytes Absolute 0.3 0.1 - 1.0 K/uL   Eosinophils Relative 0 %   Eosinophils Absolute 0.0 0.0 - 0.5 K/uL   Basophils Relative 0 %   Basophils Absolute 0.0 0.0 - 0.1 K/uL   Immature Granulocytes 1 %   Abs Immature Granulocytes 0.02 0.00 - 0.07 K/uL  Glucose,  capillary     Status: None   Collection Time: 03/03/19  7:56 AM  Result Value Ref Range   Glucose-Capillary 99 70 - 99 mg/dL  CBC     Status: Abnormal   Collection Time: 03/03/19 10:41 AM  Result Value Ref Range   WBC 4.5 4.0 - 10.5 K/uL   RBC 6.16 (H) 3.87 - 5.11 MIL/uL   Hemoglobin 16.3 (H) 12.0 - 15.0 g/dL   HCT 50.0 (H) 36.0 - 46.0 %   MCV 81.2 80.0 - 100.0 fL   MCH 26.5 26.0 - 34.0 pg   MCHC 32.6 30.0 - 36.0 g/dL   RDW 14.6 11.5 - 15.5 %   Platelets 192 150 - 400 K/uL   nRBC 0.0 0.0 - 0.2 %  Basic metabolic panel     Status: Abnormal   Collection Time: 03/03/19 10:41 AM  Result Value Ref Range   Sodium 136 135 - 145 mmol/L   Potassium 3.9 3.5 - 5.1 mmol/L   Chloride 100 98 - 111 mmol/L   CO2 27 22 - 32 mmol/L   Glucose, Bld 101 (H) 70 - 99 mg/dL   BUN 10 8 - 23 mg/dL   Creatinine, Ser 0.83 0.44 - 1.00 mg/dL   Calcium 8.6 (L) 8.9 - 10.3 mg/dL   GFR calc non Af Amer >60 >60 mL/min   GFR calc Af Amer >60 >60 mL/min   Anion gap 9 5 - 15  Urinalysis, Routine w reflex microscopic     Status: Abnormal   Collection Time: 03/03/19 11:00 AM  Result Value Ref  Range   Color, Urine AMBER (A) YELLOW   APPearance CLOUDY (A) CLEAR   Specific Gravity, Urine 1.021 1.005 - 1.030   pH 6.0 5.0 - 8.0   Glucose, UA NEGATIVE NEGATIVE mg/dL   Hgb urine dipstick NEGATIVE NEGATIVE   Bilirubin Urine NEGATIVE NEGATIVE   Ketones, ur NEGATIVE NEGATIVE mg/dL   Protein, ur 30 (A) NEGATIVE mg/dL   Nitrite NEGATIVE NEGATIVE   Leukocytes,Ua NEGATIVE NEGATIVE   RBC / HPF 0-5 0 - 5 RBC/hpf   WBC, UA 0-5 0 - 5 WBC/hpf   Bacteria, UA MANY (A) NONE SEEN   Squamous Epithelial / LPF 0-5 0 - 5   Mucus PRESENT    Hyaline Casts, UA PRESENT    Assessment/Plan:  Assessment: Ms. Goulette is an 84 yo F w/ a PMHx of Alzheimer's dementia, HFpEF (EF 60-65% in 08/2018) and HTN who presented a few days after hospital discharge for weakness that is unchanged since prior admission with reports of ataxia found to have left gaze palsy on exam for which patient underwent CT Head and MRIx2 which was negative for infarct here for SNF placement.   Plan: Active Problems:   Unsteadiness   Suspected cerebrovascular accident (CVA)   Orthostatic hypotension -pt with previous admission for weakness who declined SNF placement at discharge here with persistent weakness -pt with reports of ataxia found to have left gaze palsy on initial exam who underwent CT Head and MRIx2 without evidence of infarct; no EOM abnormalities on fu exams -EEG unremarkable, orthostatics soft -neuro has some concerns for depression and seizures per hx from family, recommending keppra 250 BID and neuro follow up  -PT ordered and recommends SNF -TOC consulted for placement -[facility] has [facility] who has been called regarding hospital admission and placement assistance  -some lethargy yesterday which postponed dc; less lethargic today, answers questions appropriately -unfortunately, pt febrile to 101.2 yesterday but patient denies infectious  sx; afebrile since then -chest x-ray unremarkable; discussed with reading radiologist  Dr. Rosana Hoes and he sees no cavitary lesions concerning for TB -UA with bacteria but no nitrites or leukocytes    Plan: -keppra per neuro -aspirin 81 mg daily -fu TOC recs -stable for d/c today  Dispo: Anticipated discharge today pending bed placement.   Al Decant, MD 03/03/2019, 12:22 PM Pager: [phone]

## 2019-04-17 ENCOUNTER — Emergency Department (HOSPITAL_COMMUNITY): Payer: Medicare (Managed Care)

## 2019-04-17 ENCOUNTER — Encounter (HOSPITAL_COMMUNITY): Payer: Self-pay

## 2019-04-17 ENCOUNTER — Other Ambulatory Visit: Payer: Self-pay

## 2019-04-17 ENCOUNTER — Emergency Department (HOSPITAL_COMMUNITY)
Admission: EM | Admit: 2019-04-17 | Discharge: 2019-04-17 | Disposition: A | Discharge status: Home / Self Care | Payer: Medicare (Managed Care) | Attending: Emergency Medicine | Admitting: Emergency Medicine

## 2019-04-17 DIAGNOSIS — I110 Hypertensive heart disease with heart failure: Secondary | ICD-10-CM | POA: Insufficient documentation

## 2019-04-17 DIAGNOSIS — I5032 Chronic diastolic (congestive) heart failure: Secondary | ICD-10-CM | POA: Insufficient documentation

## 2019-04-17 DIAGNOSIS — R4182 Altered mental status, unspecified: Secondary | ICD-10-CM | POA: Diagnosis present

## 2019-04-17 DIAGNOSIS — R569 Unspecified convulsions: Secondary | ICD-10-CM | POA: Diagnosis not present

## 2019-04-17 DIAGNOSIS — Z79899 Other long term (current) drug therapy: Secondary | ICD-10-CM | POA: Insufficient documentation

## 2019-04-17 DIAGNOSIS — Z7982 Long term (current) use of aspirin: Secondary | ICD-10-CM | POA: Diagnosis not present

## 2019-04-17 DIAGNOSIS — F0150 Vascular dementia without behavioral disturbance: Secondary | ICD-10-CM | POA: Insufficient documentation

## 2019-04-17 LAB — COMPREHENSIVE METABOLIC PANEL
ALT: 12 U/L (ref 0–44)
AST: 20 U/L (ref 15–41)
Albumin: 2.2 g/dL — ABNORMAL LOW (ref 3.5–5.0)
Alkaline Phosphatase: 57 U/L (ref 38–126)
Anion gap: 12 (ref 5–15)
BUN: 5 mg/dL — ABNORMAL LOW (ref 8–23)
CO2: 18 mmol/L — ABNORMAL LOW (ref 22–32)
Calcium: 6.7 mg/dL — ABNORMAL LOW (ref 8.9–10.3)
Chloride: 106 mmol/L (ref 98–111)
Creatinine, Ser: 0.72 mg/dL (ref 0.44–1.00)
Glucose, Bld: 125 mg/dL — ABNORMAL HIGH (ref 70–99)
Potassium: 3.9 mmol/L (ref 3.5–5.1)
Sodium: 136 mmol/L (ref 135–145)
Total Bilirubin: 0.9 mg/dL (ref 0.3–1.2)
Total Protein: 4.4 g/dL — ABNORMAL LOW (ref 6.5–8.1)

## 2019-04-17 LAB — URINALYSIS, ROUTINE W REFLEX MICROSCOPIC
Bilirubin Urine: NEGATIVE
Glucose, UA: NEGATIVE mg/dL
Hgb urine dipstick: NEGATIVE
Ketones, ur: NEGATIVE mg/dL
Nitrite: NEGATIVE
Protein, ur: NEGATIVE mg/dL
Specific Gravity, Urine: 1.015 (ref 1.005–1.030)
pH: 6 (ref 5.0–8.0)

## 2019-04-17 LAB — CBC WITH DIFFERENTIAL/PLATELET
Abs Immature Granulocytes: 0.02 10*3/uL (ref 0.00–0.07)
Basophils Absolute: 0 10*3/uL (ref 0.0–0.1)
Basophils Relative: 1 %
Eosinophils Absolute: 0 10*3/uL (ref 0.0–0.5)
Eosinophils Relative: 0 %
HCT: 41.3 % (ref 36.0–46.0)
Hemoglobin: 12.3 g/dL (ref 12.0–15.0)
Immature Granulocytes: 0 %
Lymphocytes Relative: 49 %
Lymphs Abs: 3.1 10*3/uL (ref 0.7–4.0)
MCH: 25.2 pg — ABNORMAL LOW (ref 26.0–34.0)
MCHC: 29.8 g/dL — ABNORMAL LOW (ref 30.0–36.0)
MCV: 84.6 fL (ref 80.0–100.0)
Monocytes Absolute: 0.4 10*3/uL (ref 0.1–1.0)
Monocytes Relative: 6 %
Neutro Abs: 2.7 10*3/uL (ref 1.7–7.7)
Neutrophils Relative %: 44 %
Platelets: 211 10*3/uL (ref 150–400)
RBC: 4.88 MIL/uL (ref 3.87–5.11)
RDW: 15.3 % (ref 11.5–15.5)
WBC: 6.2 10*3/uL (ref 4.0–10.5)
nRBC: 0 % (ref 0.0–0.2)

## 2019-04-17 LAB — TROPONIN I (HIGH SENSITIVITY)
Troponin I (High Sensitivity): <2 ng/L (ref <18)
Troponin I (High Sensitivity): 3 ng/L (ref <18)

## 2019-04-17 IMAGING — DX DG CHEST 1V PORT
1 series · 1 of 1 positions shown · non-contrast
Comparison: Portable exam [AH] hours compared to [DATE]

CLINICAL DATA: Altered mental status, went unresponsive and fell
forward striking head on bedside table while talking with staff, was
unresponsive for 2 minutes

EXAM:
PORTABLE CHEST 1 VIEW

[chest]
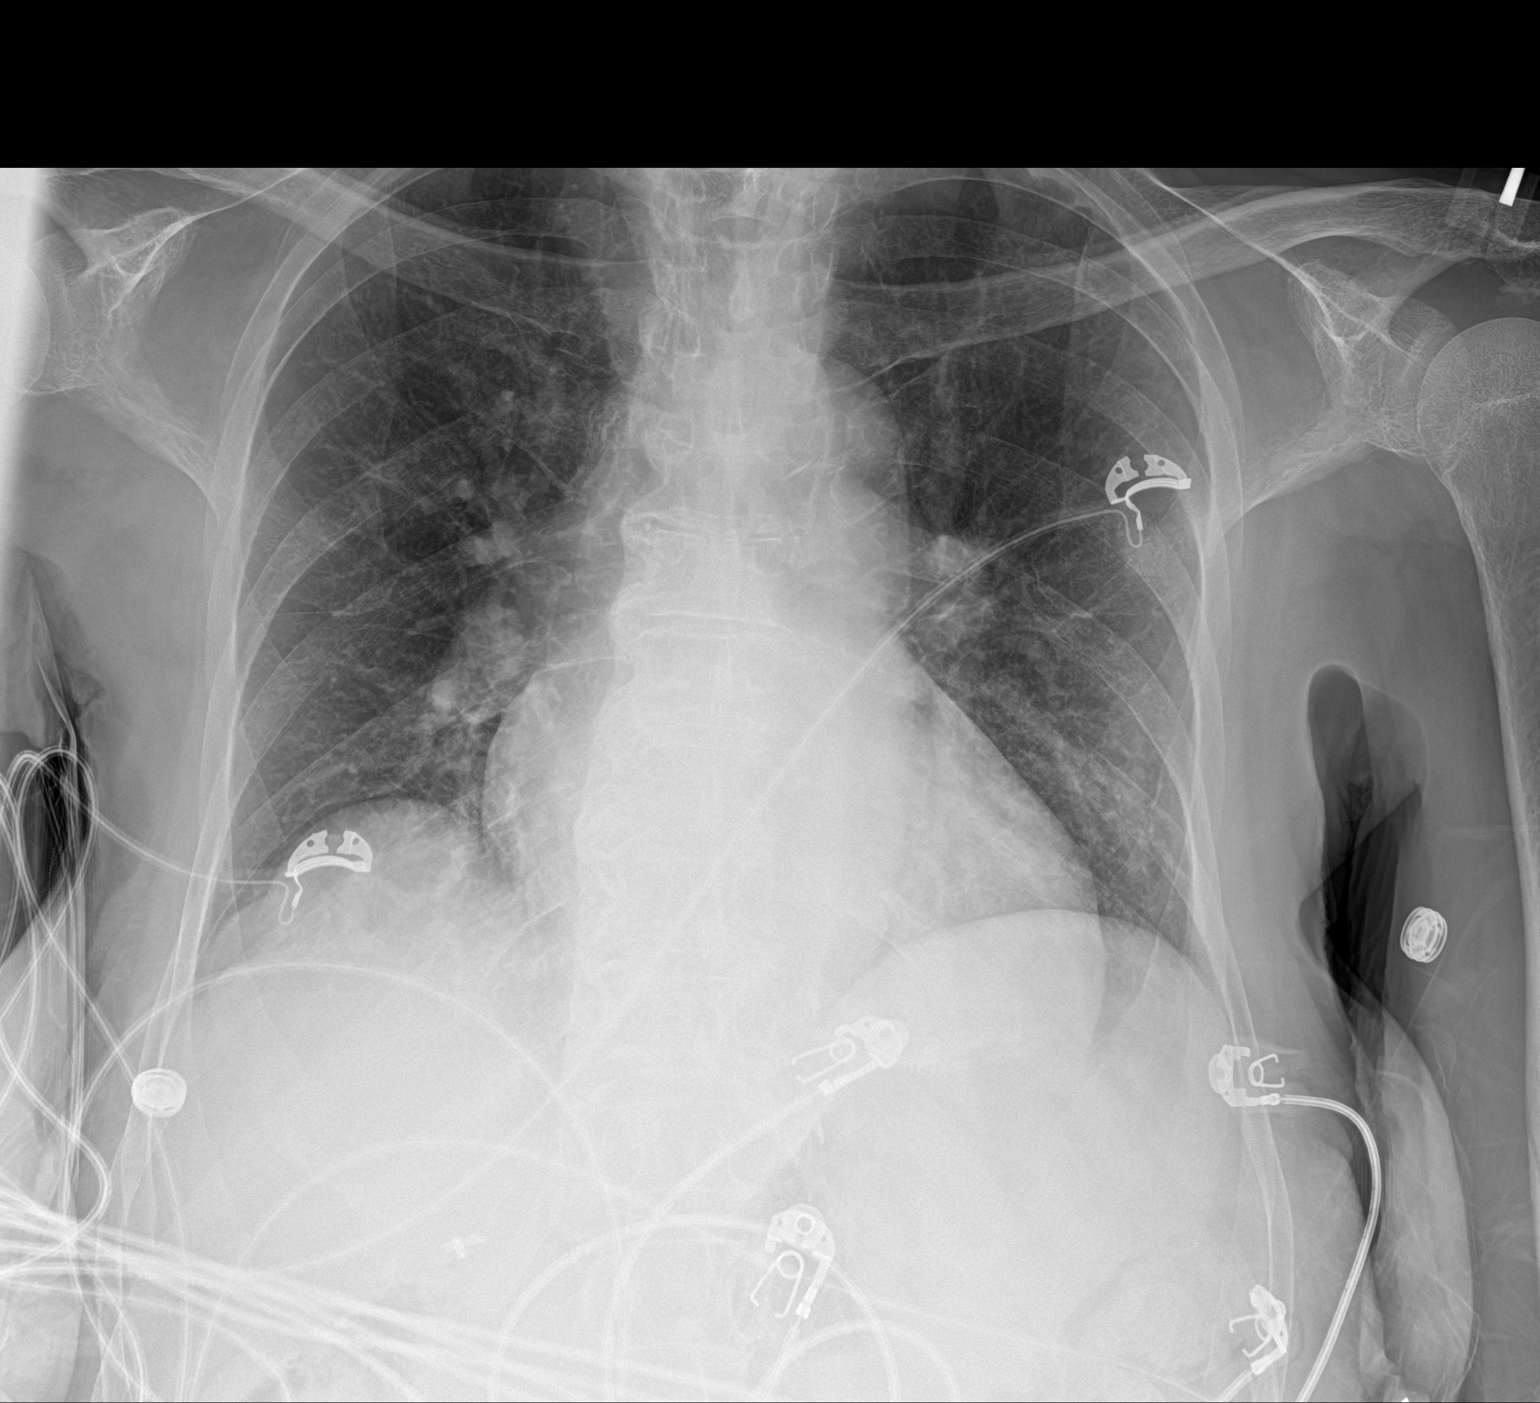

[1 of 1 positions shown; findings below may reference images not displayed]

FINDINGS: Enlargement of cardiac silhouette.

Mediastinal contours and pulmonary vascularity normal.

Atherosclerotic calcification aorta.

Eventration RIGHT diaphragm unchanged.

Hazy opacity at LEFT base question atelectasis versus infiltrate.

Remaining lungs clear.

No pleural effusion or pneumothorax.

Bones demineralized with scattered endplate spur formation thoracic
spine.
IMPRESSION: Hazy opacity at LEFT base question atelectasis versus infiltrate.

Enlargement of cardiac silhouette.

## 2019-04-17 IMAGING — CT CT HEAD W/O CM
4 series · 16 of 47 positions shown, 18 images · non-contrast
Comparison: MRI [DATE], CT [DATE], [DATE]

CLINICAL DATA: Unresponsive and hit head

EXAM:
CT HEAD WITHOUT CONTRAST
TECHNIQUE: Contiguous axial images were obtained from the base of the skull
through the vertex without intravenous contrast.

[Series 3: head without · axial · non-contrast · 0.41mm/px · z∈[-132,-16]mm · 7 of 31 slices shown, 9 images]
[im 4/31  brain]
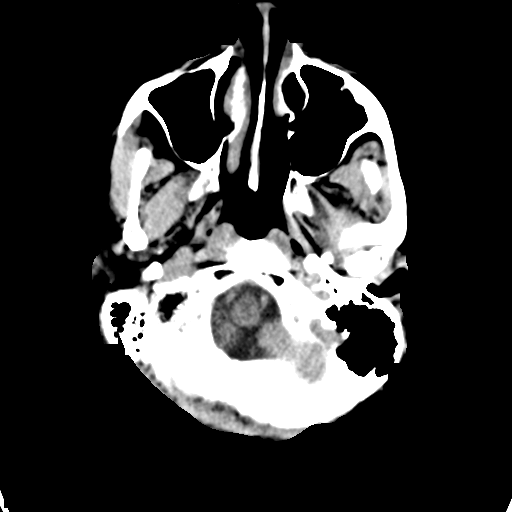
[im 4/31  bone]
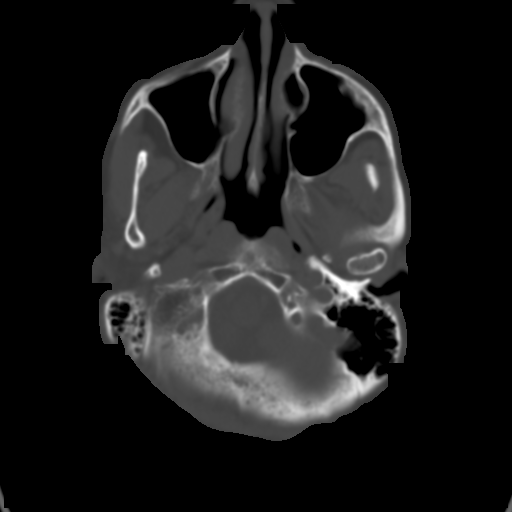
[im 8/31  brain]
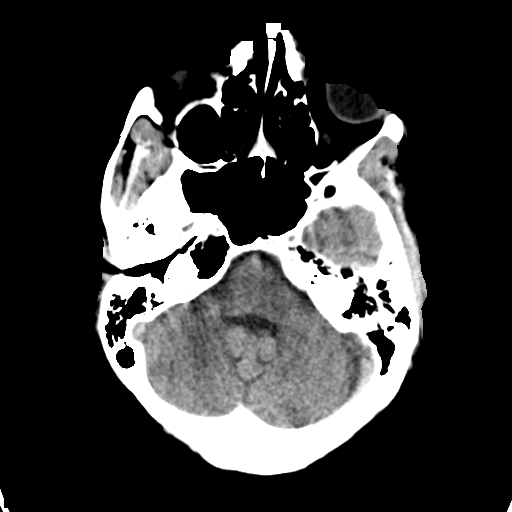
[im 12/31  brain]
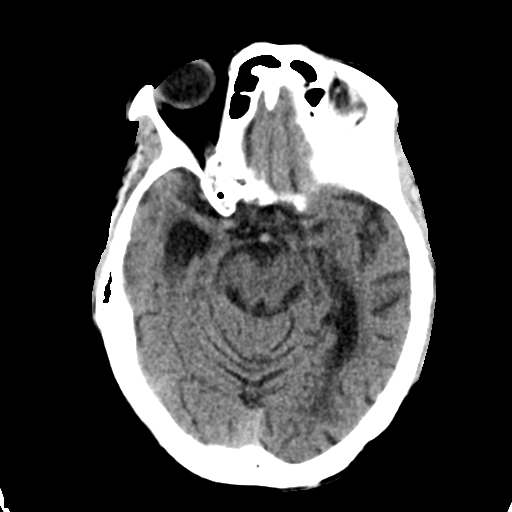
[im 16/31  brain]
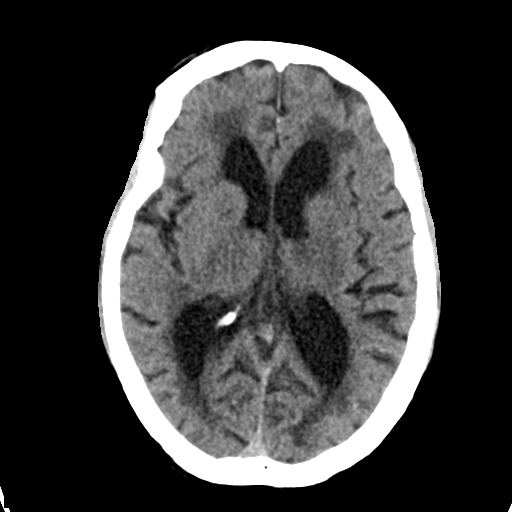
[im 19/31  brain]
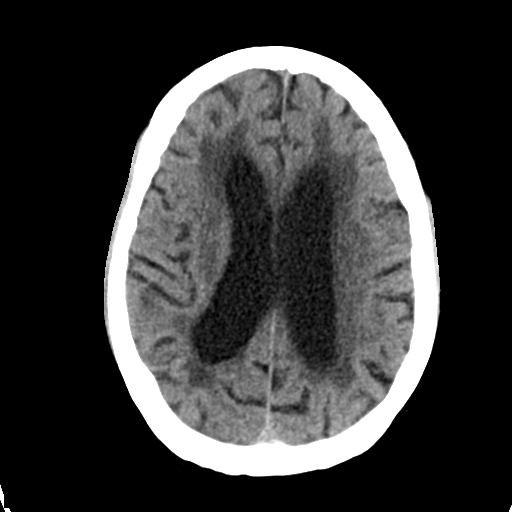
[im 19/31  bone]
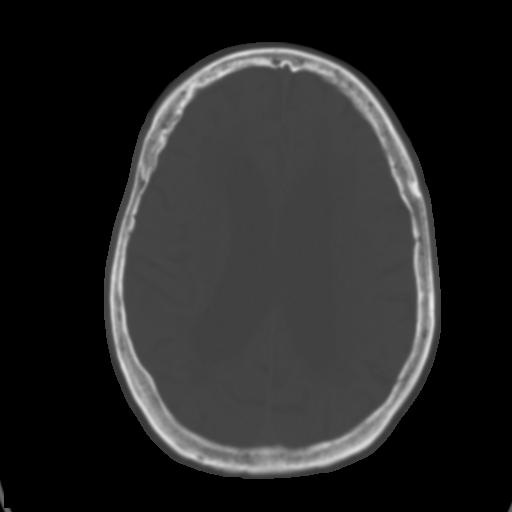
[im 23/31  brain]
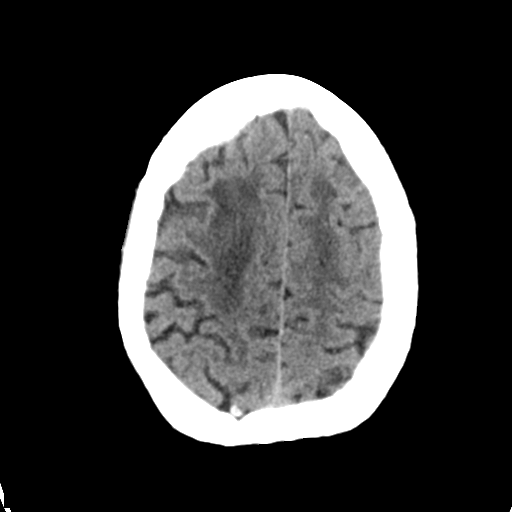
[im 27/31  brain]
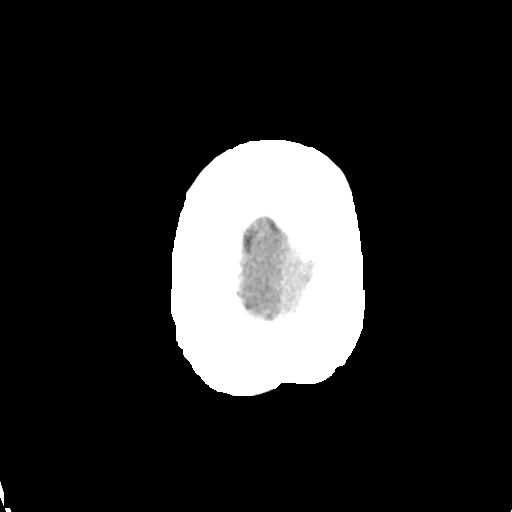

[Series 4: head bone · axial · 0.41mm/px · z∈[-132,-102]mm · 3 of 76 slices shown]
[im 8/76  bone]
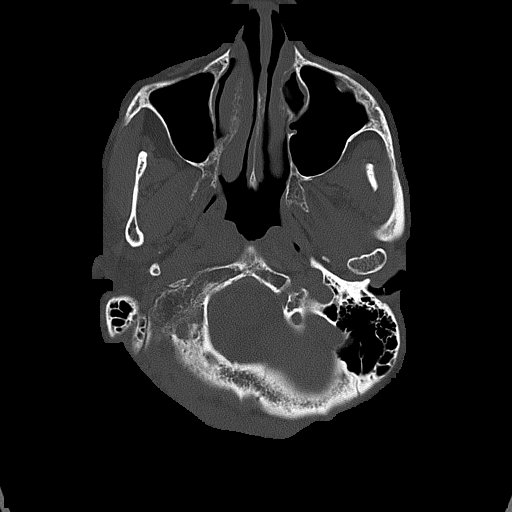
[im 16/76  bone]
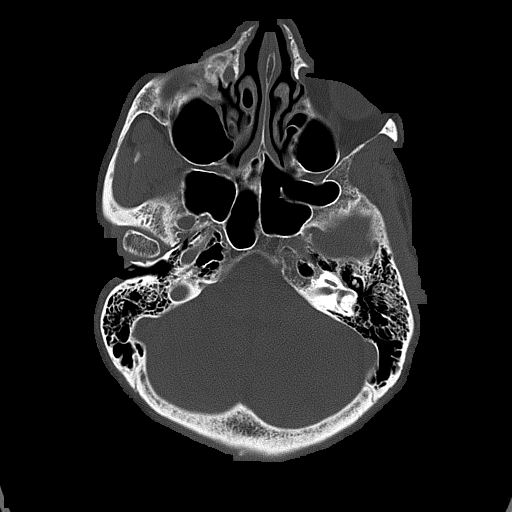
[im 23/76  bone]
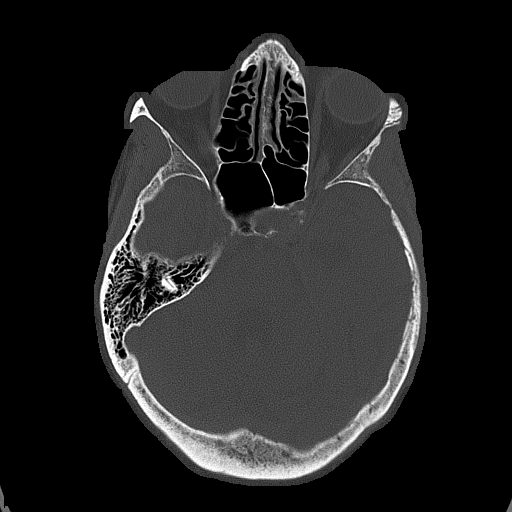

[Series 5: head without cor · coronal · non-contrast · 0.29mm/px · 3 of 67 slices shown]
[im 23/67  brain]
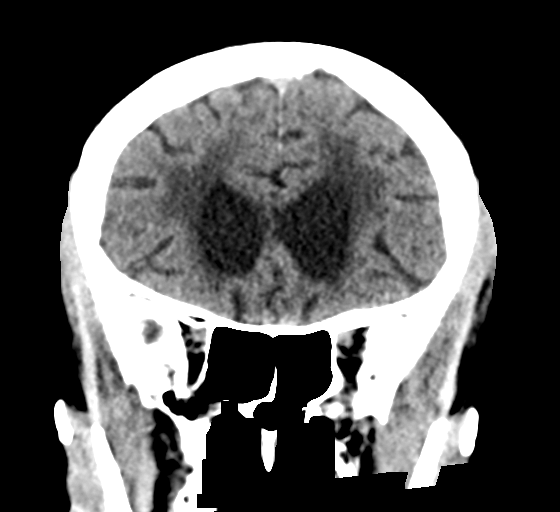
[im 30/67  brain]
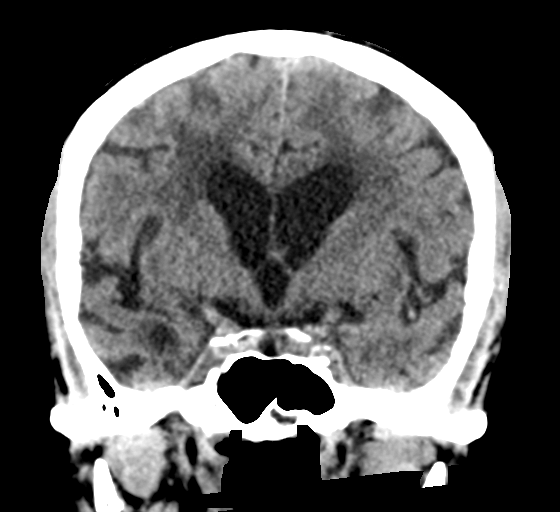
[im 37/67  brain]
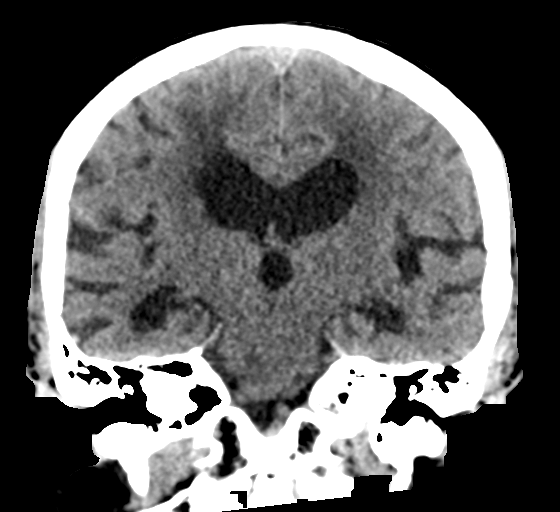

[Series 6: head without sag · sagittal · non-contrast · 0.29mm/px · 3 of 57 slices shown]
[im 20/57  brain]
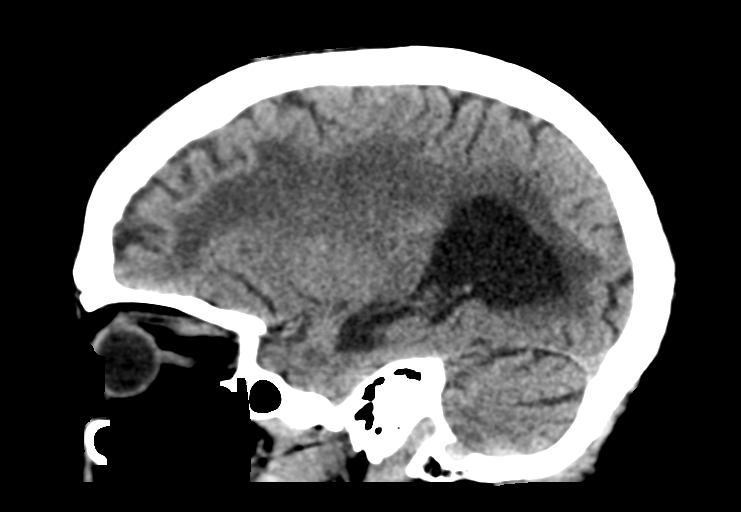
[im 29/57  brain]
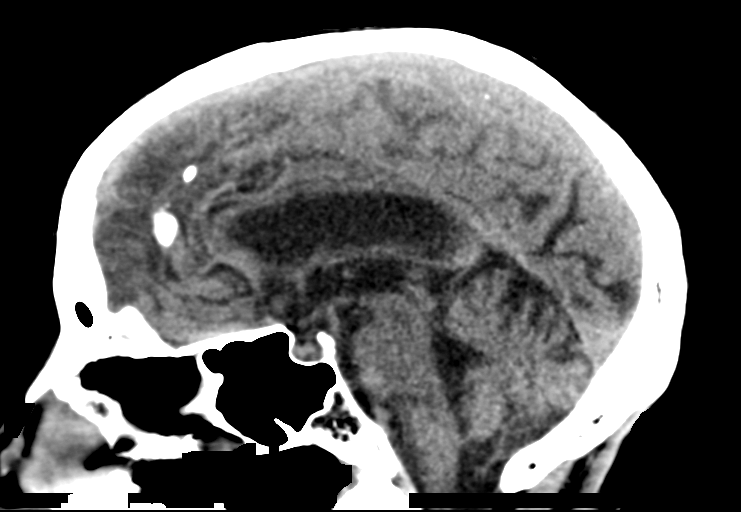
[im 38/57  brain]
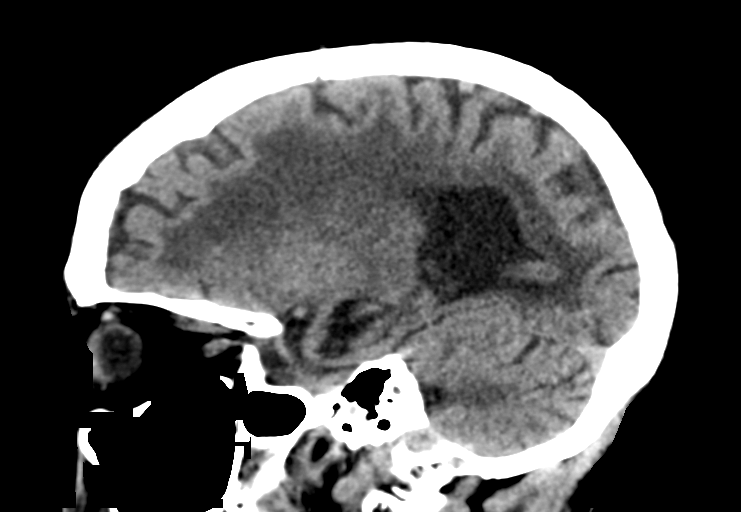

[16 of 47 positions shown; findings below may reference images not displayed]

FINDINGS: Brain: No acute territorial infarction, hemorrhage, or intracranial
mass. Mild atrophy. Extensive hypodensity in the white matter
consistent with chronic small vessel ischemic change. Stable
ventricle size.

Vascular: No hyperdense vessels.  Carotid vascular calcification

Skull: Normal. Negative for fracture or focal lesion.

Sinuses/Orbits: Mild mucosal thickening in the sinuses. Chronic
right nasal bone deformity.

Other: None
IMPRESSION: 1. No CT evidence for acute intracranial abnormality.
2. Atrophy and chronic small vessel ischemic changes of the white
matter

## 2019-04-17 MED ORDER — DIVALPROEX SODIUM 250 MG PO DR TAB
1000.0000 mg | DELAYED_RELEASE_TABLET | Freq: Once | ORAL | Status: AC
  Administered 2019-04-17: 1000 mg via ORAL
  Filled 2019-04-17: qty 4

## 2019-04-17 MED ORDER — DIVALPROEX SODIUM 500 MG PO DR TAB: 500.0000 mg | DELAYED_RELEASE_TABLET | Freq: Two times a day (BID) | ORAL | 0 refills | Status: DC

## 2019-04-17 NOTE — ED Triage Notes (Signed)
Pt arrived via [facility] from [facility] per staff pt was talking to them then went unresponsive and fell forward hitting her head on bedside table. Pt was unresponsive for 2 mins. Per EMS pt was still unresponsive when they arrived for 5 mins. Pt has hx of seizures and they recently took her off of keppra. Per staff pt didn't have any seizure like movements when she went unresponsive. Pt not on blood thinners. Per EMS 12 lead showed pt going in and out of LBBB and peaked T waves. Pt alert to verbal stimuli. Pt is NSR w/BBB w/ST depression on monitor. VS WNL

## 2019-04-17 NOTE — ED Provider Notes (Signed)
[facility] EMERGENCY DEPARTMENT Provider Note   CSN: [id number] Arrival date & time: 04/17/19  1430     History Chief Complaint  Patient presents with  . Altered Mental Status    Nicole Blackwell is a 84 y.o. female.  The history is provided by the EMS personnel, medical records and the nursing home. No language interpreter was used.  Altered Mental Status  Nicole Blackwell is a 84 y.o. female who presents to the Emergency Department complaining of syncope. Level V caveat due to altered mental status. History is provided by EMS. She presents the emergency department by EMS from [facility] following witness syncopal event. She was talking with the nurse's aide when she became unresponsive with her head falling forward and striking a table. She was unresponsive for 2 to 5 minutes. There was no generalized activity. On EMS arrival patient was unresponsive but that was gradually regaining consciousness. At baseline she is minimally verbal. EMS reports that her EKG had a lot of variability with intermittent left bundle branch block. EMS also reports that her Keppra was recently discontinued.    Additional history from nursing facility obtained after patient's initial ED arrival. Nursing facility reports the patient did have seizure like activity of her head and right arm along with unresponsiveness. No reports of recent illnesses.    Past Medical History:  Diagnosis Date  . BACK PAIN   . Hypertension   . OSTEOPOROSIS   . PLANTAR FASCIITIS   . Positive PPD   . SHINGLES   . SYMPTOMATIC MENOPAUSAL/FEMALE CLIMACTERIC STATES   . Syncope and collapse 12/13/2014  . Uterine cancer [facility])     Patient Active Problem List   Diagnosis Date Noted  . Orthostatic hypotension   . Unsteadiness 02/28/2019  . Suspected cerebrovascular accident (CVA) 02/28/2019  . Acute encephalopathy 02/24/2019  . Lethargy 02/24/2019  . AMS (altered mental status) 12/17/2018  . Fatigue 08/27/2018  .  Protein calorie malnutrition ([place]) 05/24/2018  . Hypoalbuminemia 05/23/2018  . Abnormal EEG 05/22/2018  . Dementia ([place]) 05/21/2018  . Fall 05/20/2018  . Rhabdomyolysis 05/20/2018  . Bilateral lower extremity edema 05/20/2018  . Serum total bilirubin elevated 05/20/2018  . B12 deficiency 01/17/2018  . Vascular dementia without behavioral disturbance ([place]) 01/17/2018  . Laceration of scalp without foreign body   . Chronic diastolic CHF (congestive heart failure) ([place]) 12/13/2014  . Syncope 01/12/2012  . BACK PAIN 04/30/2008  . SHINGLES 09/27/2007  . SYMPTOMATIC MENOPAUSAL/FEMALE CLIMACTERIC STATES 06/20/2007  . Osteoporosis 03/11/2007  . PLANTAR FASCIITIS 11/16/2006    Past Surgical History:  Procedure Laterality Date  . CATARACT EXTRACTION, BILATERAL Bilateral   . DILATION AND CURETTAGE OF UTERUS  1960's X 2   "I lost 2 children to miscarriage"  . LAPAROSCOPIC CHOLECYSTECTOMY    . SIGMOIDOSCOPY    . TONSILLECTOMY    . VAGINAL HYSTERECTOMY       OB History   No obstetric history on file.     Family History  Problem Relation Age of Onset  . Healthy Mother   . Healthy Father     Social History   Tobacco Use  . Smoking status: Never Smoker  . Smokeless tobacco: Never Used  Substance Use Topics  . Alcohol use: No  . Drug use: No    Home Medications Prior to Admission medications   Medication Sig Start Date End Date Taking? Authorizing Provider  aspirin EC 81 MG tablet Take 81 mg by mouth at bedtime.  [provider]  levETIRAcetam (KEPPRA) 250 MG tablet Take 1 tablet (250 mg total) by mouth 2 (two) times daily. 03/01/19   Al Decant, MD  losartan (COZAAR) 25 MG tablet TAKE 1 TABLET(25 MG) BY MOUTH DAILY Patient taking differently: Take 25 mg by mouth daily.  09/18/18   Billie Ruddy, MD  vitamin B-12 (CYANOCOBALAMIN) 100 MCG tablet Take 100 mcg by mouth daily.    [provider]    Allergies    Penicillin g benzathine and  Penicillins  Review of Systems   Review of Systems  All other systems reviewed and are negative.   Physical Exam Updated Vital Signs BP 119/88   Pulse 69   Temp 97.6 F (36.4 C) (Oral)   Resp 16   Ht 5\' 4"  (1.626 m)   Wt 66 kg   SpO2 99%   BMI 24.98 kg/m   Physical Exam Vitals and nursing note reviewed.  Constitutional:      Appearance: She is well-developed.  HENT:     Head: Normocephalic and atraumatic.  Cardiovascular:     Rate and Rhythm: Normal rate and regular rhythm.     Heart sounds: No murmur.  Pulmonary:     Effort: Pulmonary effort is normal. No respiratory distress.     Breath sounds: Normal breath sounds.  Abdominal:     Palpations: Abdomen is soft.     Tenderness: There is no abdominal tenderness. There is no guarding or rebound.  Musculoskeletal:        General: No tenderness.  Skin:    General: Skin is warm and dry.  Neurological:     Mental Status: She is alert.     Comments: Drowsy but awakens to verbal stimuli. Disoriented to place in time in recent events. Five out of five strength in all four extremities  Psychiatric:        Behavior: Behavior normal.     ED Results / Procedures / Treatments   Labs (all labs ordered are listed, but only abnormal results are displayed) Labs Reviewed  COMPREHENSIVE METABOLIC PANEL  CBC WITH DIFFERENTIAL/PLATELET  URINALYSIS, ROUTINE W REFLEX MICROSCOPIC  TROPONIN I (HIGH SENSITIVITY)    EKG EKG Interpretation  Date/Time:  Monday April 17 2019 14:47:57 EST Ventricular Rate:  70 PR Interval:    QRS Duration: 114 QT Interval:  449 QTC Calculation: 485 R Axis:   -71 Text Interpretation: Sinus rhythm Left anterior fascicular block Abnormal R-wave progression, late transition Left ventricular hypertrophy Abnormal T, probable ischemia, lateral leads ST elevation, consider inferior injury Confirmed by Quintella Reichert [phone]) on 04/17/2019 3:04:15 PM   Radiology No results  found.  Procedures Procedures (including critical care time)  Medications Ordered in ED Medications - No data to display  ED Course  I have reviewed the triage vital signs and the nursing notes.  Pertinent labs & imaging results that were available during my care of the patient were reviewed by me and considered in my medical decision making (see chart for details).    MDM Rules/Calculators/A&P                      patient here for evaluation following loss of consciousness, possible seizure activity. She was recently started on seizure medications for similar event. These medications were discontinued due to somnolence. Patient initially very drowsy but more conversant on recheck during her ED stay. Current presentation more consistent with seizure activity versus true syncopal event with arrhythmia. Her mental status  continues to improve during her ED stay. She was recently started on Keppra, which was discontinued due to somnolence. Discussed with neurologist, who recommends changing to Depakote. Discussed this change with patient's daughter. Patient care transferred pending urinalysis, anticipate discharge home with outpatient follow-up and return precautions.  Chest x-ray with atelectasis versus infiltrate. Patient with no respiratory distress, clear lungs, favor atelectasis over infiltrate at this point.   Final Clinical Impression(s) / ED Diagnoses Final diagnoses:  None    Rx / DC Orders ED Discharge Orders    None       Quintella Reichert, MD 04/17/19 1711

## 2019-04-17 NOTE — ED Provider Notes (Signed)
Signout from Dr. Ralene Bathe.  84 year old female here after possible syncopal versus seizure.  Patient was less responsive on arrival here but is having clearing mental status now.  Head CT and labs unremarkable.  She is still pending a urinalysis.  Plan is to follow-up on urinalysis and patient is to be started on Depakote for possible seizure after recommendations from neurology.  Plan is to return to her facility. Physical Exam  BP 131/69   Pulse 69   Temp 97.6 F (36.4 C) (Oral)   Resp 11   Ht 5\' 4"  (1.626 m)   Wt 66 kg   SpO2 99%   BMI 24.98 kg/m   Physical Exam  ED Course/Procedures     Procedures  MDM   Urinalysis showing 6-10 whites and many bacteria nitrite negative.  Added on urine culture.  We will hold off on antibiotics as prior cultures have been growing multiple species.      Hayden Rasmussen, MD 04/18/19 [phone]

## 2019-04-17 NOTE — ED Notes (Signed)
Report called to [facility] to [place], [place]

## 2019-04-17 NOTE — Discharge Instructions (Addendum)
Nicole Blackwell will need her Depakote level checked in the next five days.  A urine culture was sent and will result in the next few days.  Patient may require antibiotics if culture is positive.

## 2019-04-17 NOTE — ED Notes (Signed)
Pt transported to CT ?

## 2019-04-18 ENCOUNTER — Other Ambulatory Visit: Payer: Self-pay

## 2019-04-18 ENCOUNTER — Emergency Department (HOSPITAL_COMMUNITY): Payer: Medicare (Managed Care)

## 2019-04-18 ENCOUNTER — Encounter (HOSPITAL_COMMUNITY): Payer: Self-pay

## 2019-04-18 ENCOUNTER — Emergency Department (HOSPITAL_COMMUNITY)
Admission: EM | Admit: 2019-04-18 | Discharge: 2019-04-19 | Disposition: A | Discharge status: Home / Self Care | Payer: Medicare (Managed Care) | Attending: Emergency Medicine | Admitting: Emergency Medicine

## 2019-04-18 DIAGNOSIS — Z7982 Long term (current) use of aspirin: Secondary | ICD-10-CM | POA: Diagnosis not present

## 2019-04-18 DIAGNOSIS — Z8542 Personal history of malignant neoplasm of other parts of uterus: Secondary | ICD-10-CM | POA: Diagnosis not present

## 2019-04-18 DIAGNOSIS — R4182 Altered mental status, unspecified: Secondary | ICD-10-CM | POA: Diagnosis present

## 2019-04-18 DIAGNOSIS — Z79899 Other long term (current) drug therapy: Secondary | ICD-10-CM | POA: Insufficient documentation

## 2019-04-18 DIAGNOSIS — I5032 Chronic diastolic (congestive) heart failure: Secondary | ICD-10-CM | POA: Diagnosis not present

## 2019-04-18 DIAGNOSIS — I110 Hypertensive heart disease with heart failure: Secondary | ICD-10-CM | POA: Diagnosis not present

## 2019-04-18 DIAGNOSIS — F0390 Unspecified dementia without behavioral disturbance: Secondary | ICD-10-CM | POA: Diagnosis not present

## 2019-04-18 DIAGNOSIS — R400 Somnolence: Secondary | ICD-10-CM | POA: Diagnosis not present

## 2019-04-18 DIAGNOSIS — N3000 Acute cystitis without hematuria: Secondary | ICD-10-CM

## 2019-04-18 DIAGNOSIS — Z9049 Acquired absence of other specified parts of digestive tract: Secondary | ICD-10-CM | POA: Insufficient documentation

## 2019-04-18 HISTORY — DX: Unspecified convulsions: R56.9

## 2019-04-18 LAB — CBC WITH DIFFERENTIAL/PLATELET
Abs Immature Granulocytes: 0.06 10*3/uL (ref 0.00–0.07)
Basophils Absolute: 0 10*3/uL (ref 0.0–0.1)
Basophils Relative: 0 %
Eosinophils Absolute: 0 10*3/uL (ref 0.0–0.5)
Eosinophils Relative: 0 %
HCT: 46.6 % — ABNORMAL HIGH (ref 36.0–46.0)
Hemoglobin: 14 g/dL (ref 12.0–15.0)
Immature Granulocytes: 1 %
Lymphocytes Relative: 17 %
Lymphs Abs: 1.9 10*3/uL (ref 0.7–4.0)
MCH: 25.3 pg — ABNORMAL LOW (ref 26.0–34.0)
MCHC: 30 g/dL (ref 30.0–36.0)
MCV: 84.3 fL (ref 80.0–100.0)
Monocytes Absolute: 0.6 10*3/uL (ref 0.1–1.0)
Monocytes Relative: 6 %
Neutro Abs: 8.6 10*3/uL — ABNORMAL HIGH (ref 1.7–7.7)
Neutrophils Relative %: 76 %
Platelets: 250 10*3/uL (ref 150–400)
RBC: 5.53 MIL/uL — ABNORMAL HIGH (ref 3.87–5.11)
RDW: 15.1 % (ref 11.5–15.5)
WBC: 11.2 10*3/uL — ABNORMAL HIGH (ref 4.0–10.5)
nRBC: 0 % (ref 0.0–0.2)

## 2019-04-18 LAB — COMPREHENSIVE METABOLIC PANEL
ALT: 9 U/L (ref 0–44)
AST: 23 U/L (ref 15–41)
Albumin: 2.5 g/dL — ABNORMAL LOW (ref 3.5–5.0)
Alkaline Phosphatase: 61 U/L (ref 38–126)
Anion gap: 9 (ref 5–15)
BUN: 7 mg/dL — ABNORMAL LOW (ref 8–23)
CO2: 21 mmol/L — ABNORMAL LOW (ref 22–32)
Calcium: 7.3 mg/dL — ABNORMAL LOW (ref 8.9–10.3)
Chloride: 103 mmol/L (ref 98–111)
Creatinine, Ser: 0.68 mg/dL (ref 0.44–1.00)
GFR calc Af Amer: >60 mL/min (ref >60)
GFR calc non Af Amer: >60 mL/min (ref >60)
Glucose, Bld: 95 mg/dL (ref 70–99)
Potassium: 5.3 mmol/L — ABNORMAL HIGH (ref 3.5–5.1)
Sodium: 133 mmol/L — ABNORMAL LOW (ref 135–145)
Total Bilirubin: 1.6 mg/dL — ABNORMAL HIGH (ref 0.3–1.2)
Total Protein: 5.3 g/dL — ABNORMAL LOW (ref 6.5–8.1)

## 2019-04-18 LAB — URINALYSIS, COMPLETE (UACMP) WITH MICROSCOPIC
Bilirubin Urine: NEGATIVE
Glucose, UA: NEGATIVE mg/dL
Hgb urine dipstick: NEGATIVE
Ketones, ur: 20 mg/dL — AB
Nitrite: NEGATIVE
Protein, ur: 100 mg/dL — AB
Specific Gravity, Urine: 1.016 (ref 1.005–1.030)
pH: 6 (ref 5.0–8.0)

## 2019-04-18 LAB — TROPONIN I (HIGH SENSITIVITY)
Troponin I (High Sensitivity): 4 ng/L (ref <18)
Troponin I (High Sensitivity): 9 ng/L (ref <18)

## 2019-04-18 LAB — CK: Total CK: 173 U/L (ref 38–234)

## 2019-04-18 LAB — AMMONIA: Ammonia: 27 umol/L (ref 9–35)

## 2019-04-18 LAB — CBG MONITORING, ED: Glucose-Capillary: 99 mg/dL (ref 70–99)

## 2019-04-18 IMAGING — CT CT HEAD W/O CM
3 series · 15 of 47 positions shown, 18 images · non-contrast
Comparison: [DATE] CT, MRI [DATE], CT [DATE]

CLINICAL DATA: Altered mental status

EXAM:
CT HEAD WITHOUT CONTRAST
TECHNIQUE: Contiguous axial images were obtained from the base of the skull
through the vertex without intravenous contrast.

[Series 3: head wo · axial · 0.47mm/px · z∈[+1102,+1228]mm · 9 of 30 slices shown, 12 images]
[im 3/30  brain]
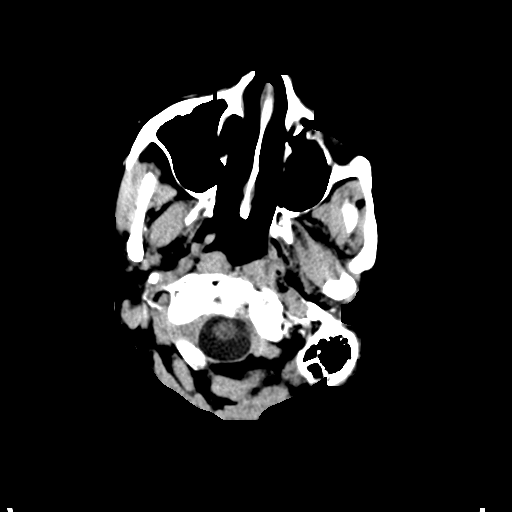
[im 3/30  bone]
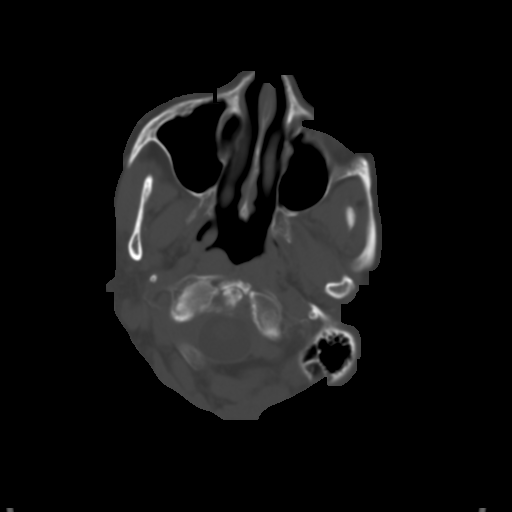
[im 6/30  brain]
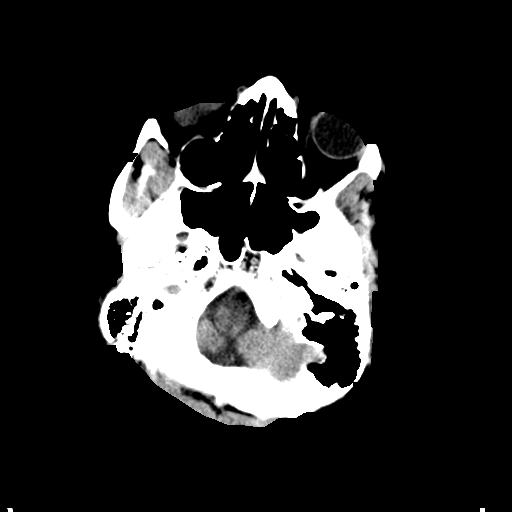
[im 9/30  brain]
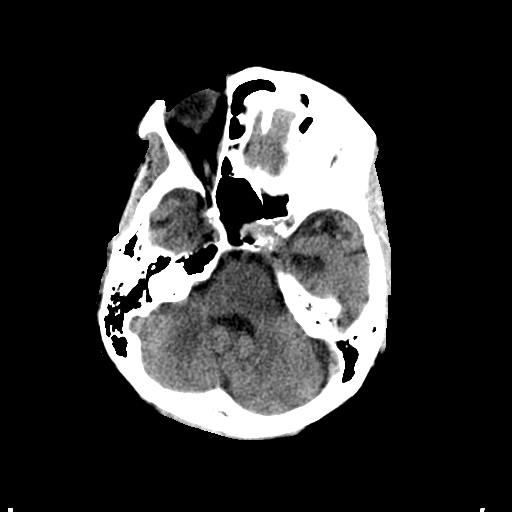
[im 12/30  brain]
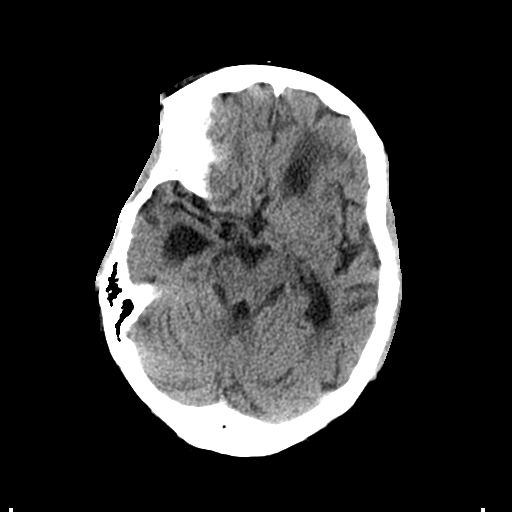
[im 16/30  brain]
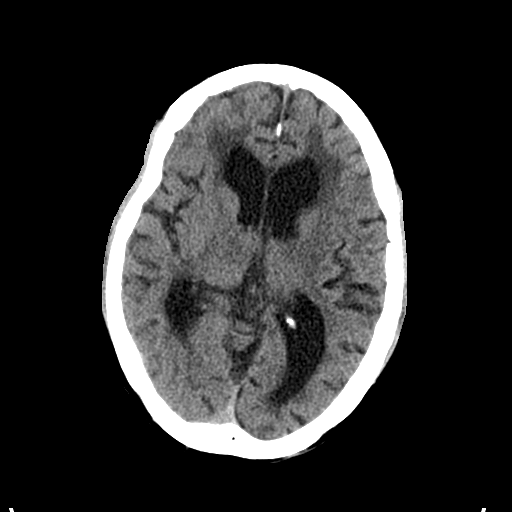
[im 16/30  bone]
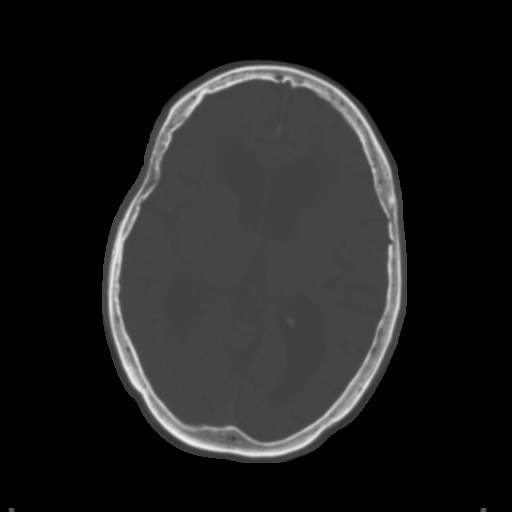
[im 19/30  brain]
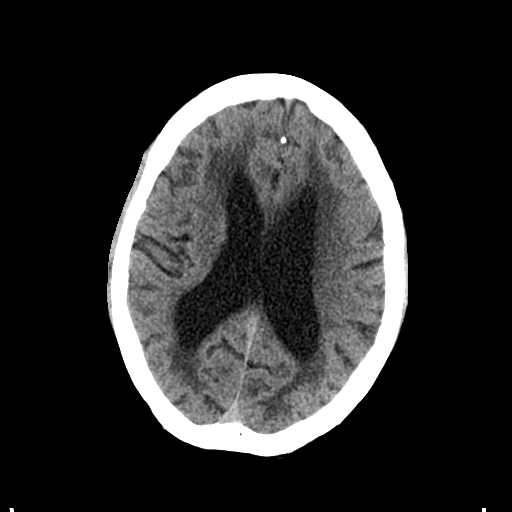
[im 22/30  brain]
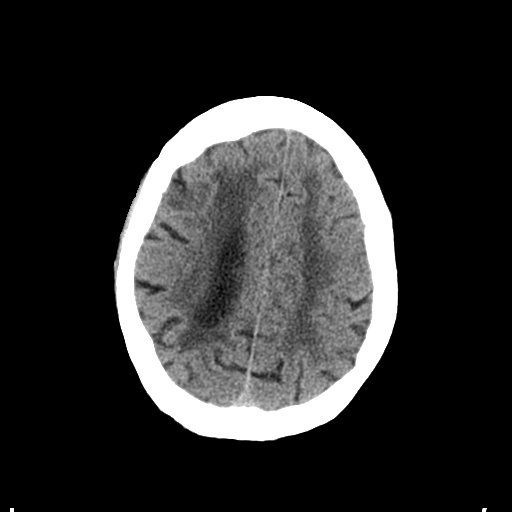
[im 25/30  brain]
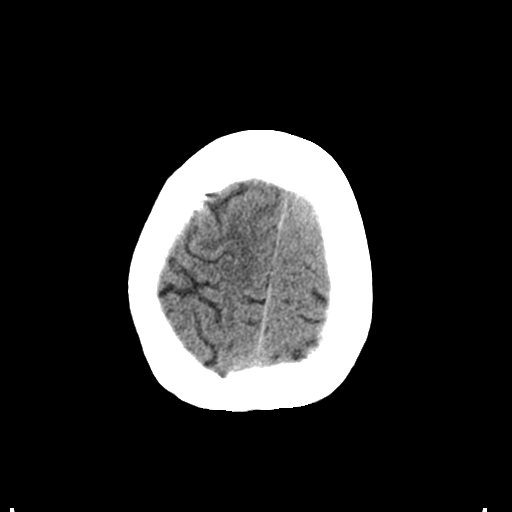
[im 28/30  brain]
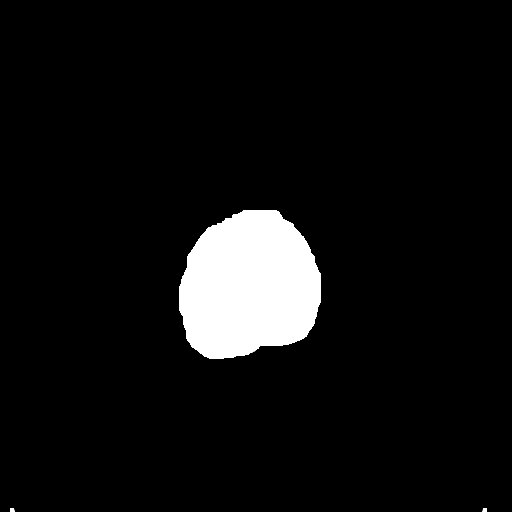
[im 28/30  bone]
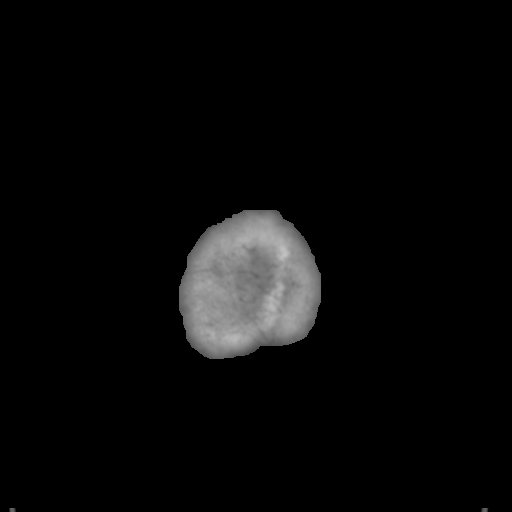

[Series 4: coronal soft tissue · coronal · 0.31mm/px · 3 of 63 slices shown]
[im 21/63  brain]
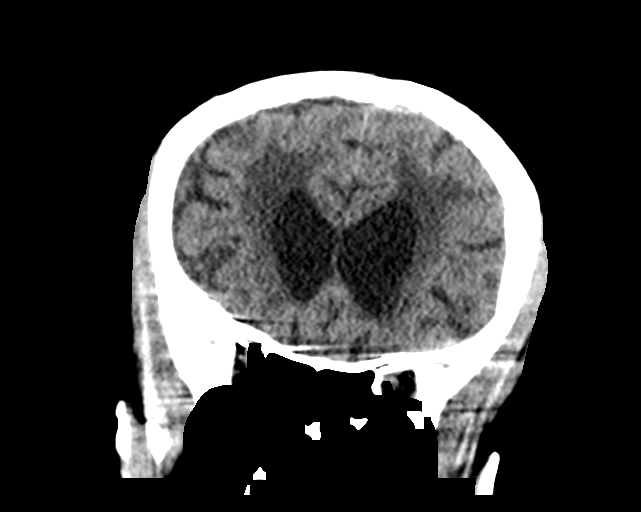
[im 28/63  brain]
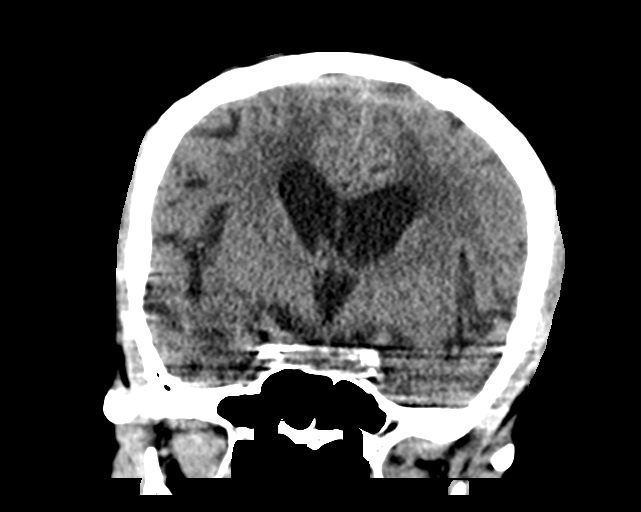
[im 35/63  brain]
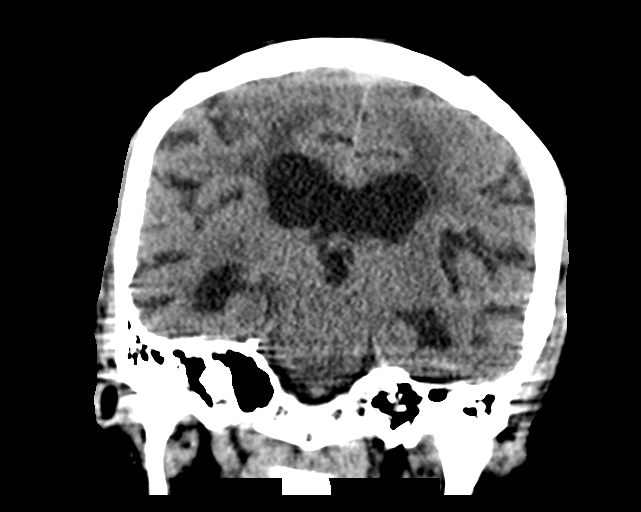

[Series 5: sagittal soft tissue · sagittal · 0.32mm/px · 3 of 51 slices shown]
[im 19/51  brain]
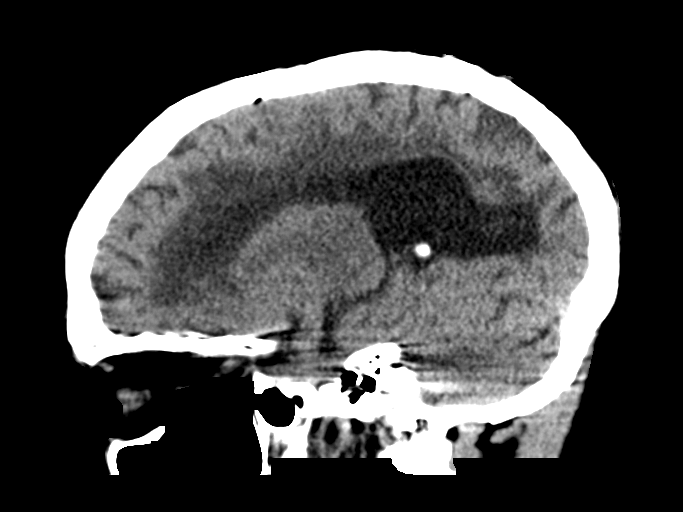
[im 26/51  brain]
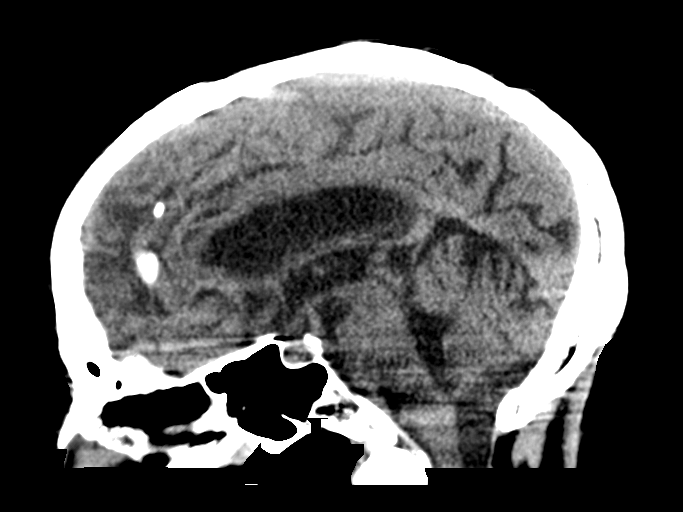
[im 33/51  brain]
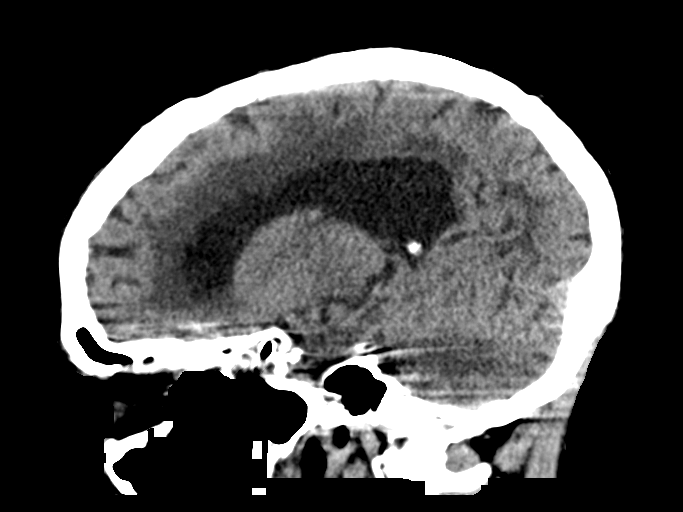

[15 of 47 positions shown; findings below may reference images not displayed]

FINDINGS: Brain: Mild motion degradation. No acute territorial infarction,
hemorrhage or intracranial mass. Atrophy. Extensive hypodensity in
the white matter consistent with chronic small vessel ischemic
change. Stable slightly enlarged ventricles likely due to atrophy.

Vascular: No hyperdense vessels.  Carotid vascular calcification.

Skull: Normal. Negative for fracture or focal lesion.

Sinuses/Orbits: No acute finding.

Other: None
IMPRESSION: 1. No CT evidence for acute intracranial abnormality.
2. Atrophy and chronic small vessel ischemic changes of the white
matter

## 2019-04-18 MED ORDER — CEFPODOXIME PROXETIL 100 MG PO TABS: 100.0000 mg | ORAL_TABLET | Freq: Two times a day (BID) | ORAL | 0 refills | Status: DC

## 2019-04-18 MED ORDER — SODIUM CHLORIDE 0.9 % IV SOLN
1.0000 g | Freq: Once | INTRAVENOUS | Status: AC
  Administered 2019-04-19: 1 g via INTRAVENOUS
  Filled 2019-04-18: qty 10

## 2019-04-18 NOTE — Discharge Instructions (Addendum)
Please return to ED if you have any new or concerning symptoms.  Today your CT scan was without any acute abnormalities.  Your oxygen saturation has been 100% on room air without any oxygenation setting it is very likely that the SPO2 reading at facility was inaccurate due to very cold hands.

## 2019-04-18 NOTE — ED Provider Notes (Signed)
  Taken in sign out from [facility] for somnolence At facility found to be hypoxic, which was deemed innacurate  Awaiting urinalysis.   Patient urine r resulted and appears infected.  I reviewed the patient's urine cultures and shows predominantly multiple species.  This is a cath sample.  I have ordered him IV Rocephin.  Patient does have a history of penicillin allergy however it appears that she has had) after review of medications given in the past.  Will discharge with Cefpodoxime.   Margarita Mail, PA-C 04/19/19 0406    Ripley Fraise, MD 04/19/19 [phone]

## 2019-04-18 NOTE — ED Provider Notes (Signed)
[facility] DEPT Provider Note   CSN: [id number] Arrival date & time: 04/18/19  1804     History Chief Complaint  Patient presents with  . Altered Mental Status    JADE KOLBERG is a 84 y.o. female.  HPI  Level 5 caveat due to altered mental status/baseline dementia.  Patient is an 84 year old female with a history of significant dementia, history of metabolic encephalopathy after EEG with no specific etiology discovered, she has been seen in the ED multiple times in the past several months for syncopal episode versus seizure and each time found to normal work-up.  On my review of EMR patient was seen 12/17/2018 and discharged after slowly improving during her hospitalization.  Patient was also hospitalized at 12/31 initially had a lactic acidosis but quickly resolved with fluids.  No specific etiology was found for her symptoms.  Patient was also seen 02/27/2019 and placed on Keppra for possible seizure however she was then taken off for severe somnolence.  She was then brought to ED for seizure-like activity had reassuring evaluation was initially a code stroke but this was called off.  She was discharged with Depakote instead of Keppra at that time.  It appears this patient has been seen numerous times for similar presentations of vague somnolence or fatigue or syncope/near-syncope/unresponsiveness.  Each time she has had reassuring work ups and resolved and hospitalization without any acute intervention.   I discussed the care with nurse at facility who states that patient was sitting in her chair earlier today was found to be breathing shallow and unresponsive when staff attempted to answer questions.  She was cold and clammy and when a nurse tech checked her oxygen was 33%.  When EMS arrived they found her to be hypoxic in the 80s and place her on oxygen and transported her to ED.  She arrived in ED she was satting 100% on room air and was talking although  somewhat incoherently.  Per facility her baseline is talking with no ability to converse.  She will ramble and not answer questions nor will she follow commands.  She also is chair bound at baseline.     Past Medical History:  Diagnosis Date  . BACK PAIN   . Hypertension   . OSTEOPOROSIS   . PLANTAR FASCIITIS   . Positive PPD   . Seizures ([place])   . SHINGLES   . SYMPTOMATIC MENOPAUSAL/FEMALE CLIMACTERIC STATES   . Syncope and collapse 12/13/2014  . Uterine cancer [facility])     Patient Active Problem List   Diagnosis Date Noted  . Orthostatic hypotension   . Unsteadiness 02/28/2019  . Suspected cerebrovascular accident (CVA) 02/28/2019  . Acute encephalopathy 02/24/2019  . Lethargy 02/24/2019  . AMS (altered mental status) 12/17/2018  . Fatigue 08/27/2018  . Protein calorie malnutrition ([place]) 05/24/2018  . Hypoalbuminemia 05/23/2018  . Abnormal EEG 05/22/2018  . Dementia ([place]) 05/21/2018  . Fall 05/20/2018  . Rhabdomyolysis 05/20/2018  . Bilateral lower extremity edema 05/20/2018  . Serum total bilirubin elevated 05/20/2018  . B12 deficiency 01/17/2018  . Vascular dementia without behavioral disturbance ([place]) 01/17/2018  . Laceration of scalp without foreign body   . Chronic diastolic CHF (congestive heart failure) ([place]) 12/13/2014  . Syncope 01/12/2012  . BACK PAIN 04/30/2008  . SHINGLES 09/27/2007  . SYMPTOMATIC MENOPAUSAL/FEMALE CLIMACTERIC STATES 06/20/2007  . Osteoporosis 03/11/2007  . PLANTAR FASCIITIS 11/16/2006    Past Surgical History:  Procedure Laterality Date  . CATARACT EXTRACTION, BILATERAL Bilateral   .  DILATION AND CURETTAGE OF UTERUS  1960's X 2   "I lost 2 children to miscarriage"  . LAPAROSCOPIC CHOLECYSTECTOMY    . SIGMOIDOSCOPY    . TONSILLECTOMY    . VAGINAL HYSTERECTOMY       OB History   No obstetric history on file.     Family History  Problem Relation Age of Onset  . Healthy Mother   . Healthy Father     Social History    Tobacco Use  . Smoking status: Never Smoker  . Smokeless tobacco: Never Used  Substance Use Topics  . Alcohol use: No  . Drug use: No    Home Medications Prior to Admission medications   Medication Sig Start Date End Date Taking? Authorizing Provider  aspirin EC 81 MG tablet Take 81 mg by mouth at bedtime.    [provider]  divalproex (DEPAKOTE) 500 MG DR tablet Take 1 tablet (500 mg total) by mouth 2 (two) times daily. 04/17/19   Quintella Reichert, MD  levETIRAcetam (KEPPRA) 250 MG tablet Take 1 tablet (250 mg total) by mouth 2 (two) times daily. 03/01/19   Al Decant, MD  losartan (COZAAR) 25 MG tablet TAKE 1 TABLET(25 MG) BY MOUTH DAILY Patient taking differently: Take 25 mg by mouth daily.  09/18/18   Billie Ruddy, MD  vitamin B-12 (CYANOCOBALAMIN) 100 MCG tablet Take 100 mcg by mouth daily.    [provider]    Allergies    Penicillin g benzathine and Penicillins  Review of Systems   Review of Systems  Unable to perform ROS: Dementia    Physical Exam Updated Vital Signs BP (!) 123/56   Pulse 82   Temp 97.7 F (36.5 C) (Axillary)   Resp 15   SpO2 100%   Physical Exam Vitals and nursing note reviewed.  Constitutional:      General: She is not in acute distress.    Comments: Pleasantly demented 84 year old female appears stated age.    HENT:     Head: Normocephalic and atraumatic.     Nose: Nose normal.     Mouth/Throat:     Mouth: Mucous membranes are moist.  Eyes:     General: No scleral icterus. Cardiovascular:     Rate and Rhythm: Normal rate and regular rhythm.     Pulses: Normal pulses.     Heart sounds: Normal heart sounds.  Pulmonary:     Effort: Pulmonary effort is normal. No respiratory distress.     Breath sounds: No wheezing.  Abdominal:     Palpations: Abdomen is soft.     Tenderness: There is no abdominal tenderness. There is no right CVA tenderness, left CVA tenderness, guarding or rebound.     Comments: Negative  Murphy, Rovsing, McBurney, no guarding rebound or tenderness.  No CVA tenderness.  Musculoskeletal:     Cervical back: Normal range of motion and neck supple. No rigidity or tenderness.     Right lower leg: No edema.     Left lower leg: No edema.     Comments: No tenderness to palpation of lower or upper extremities.  No chest wall tenderness to palpation.  No tenderness palpation of neck, full range of motion of neck, no tenderness to palpation of spine.  No step-off or deformity.  Skin:    General: Skin is warm and dry.     Capillary Refill: Capillary refill takes less than 2 seconds.     Comments: Full body skin exam conducted with  no erythema, rashes, or ecchymoses  Neurological:     Mental Status: She is alert. Mental status is at baseline.     Sensory: No sensory deficit (Patient withdraws from yellow pressure in all 4 extremities).     Comments: Patient is moving all 4 extremities spontaneously.  She has grossly 5/5 strength in all 4 extremities unable to fully assess as patient will not follow commands. Patient is at her baseline mental status of rambling, pleasantly demented and unable to answer questions.  Psychiatric:        Mood and Affect: Mood normal.        Behavior: Behavior normal.     ED Results / Procedures / Treatments   Labs (all labs ordered are listed, but only abnormal results are displayed) Labs Reviewed  COMPREHENSIVE METABOLIC PANEL - Abnormal; Notable for the following components:      Result Value   Sodium 133 (*)    Potassium 5.3 (*)    CO2 21 (*)    BUN 7 (*)    Calcium 7.3 (*)    Total Protein 5.3 (*)    Albumin 2.5 (*)    Total Bilirubin 1.6 (*)    All other components within normal limits  CBC WITH DIFFERENTIAL/PLATELET - Abnormal; Notable for the following components:   WBC 11.2 (*)    RBC 5.53 (*)    HCT 46.6 (*)    MCH 25.3 (*)    Neutro Abs 8.6 (*)    All other components within normal limits  URINE CULTURE  AMMONIA  CK  URINALYSIS,  COMPLETE (UACMP) WITH MICROSCOPIC  CBG MONITORING, ED  TROPONIN I (HIGH SENSITIVITY)  TROPONIN I (HIGH SENSITIVITY)    EKG EKG Interpretation  Date/Time:  Tuesday April 18 2019 18:56:12 EST Ventricular Rate:  74 PR Interval:    QRS Duration: 150 QT Interval:  442 QTC Calculation: 491 R Axis:   55 Text Interpretation: Sinus rhythm IVCD, consider atypical LBBB Confirmed by Gerlene Fee [phone]) on 04/18/2019 8:05:33 PM   Radiology CT HEAD WO CONTRAST  Result Date: 04/18/2019 CLINICAL DATA:  Altered mental status EXAM: CT HEAD WITHOUT CONTRAST TECHNIQUE: Contiguous axial images were obtained from the base of the skull through the vertex without intravenous contrast. COMPARISON:  04/17/2019 CT, MRI 02/28/2019, CT 02/23/2019 FINDINGS: Brain: Mild motion degradation. No acute territorial infarction, hemorrhage or intracranial mass. Atrophy. Extensive hypodensity in the white matter consistent with chronic small vessel ischemic change. Stable slightly enlarged ventricles likely due to atrophy. Vascular: No hyperdense vessels.  Carotid vascular calcification. Skull: Normal. Negative for fracture or focal lesion. Sinuses/Orbits: No acute finding. Other: None IMPRESSION: 1. No CT evidence for acute intracranial abnormality. 2. Atrophy and chronic small vessel ischemic changes of the white matter Electronically Signed   By: Donavan Foil M.D.   On: 04/18/2019 20:07   CT Head Wo Contrast  Result Date: 04/17/2019 CLINICAL DATA:  Unresponsive and hit head EXAM: CT HEAD WITHOUT CONTRAST TECHNIQUE: Contiguous axial images were obtained from the base of the skull through the vertex without intravenous contrast. COMPARISON:  MRI 02/28/2019, CT 02/23/2019, 12/20/2018 FINDINGS: Brain: No acute territorial infarction, hemorrhage, or intracranial mass. Mild atrophy. Extensive hypodensity in the white matter consistent with chronic small vessel ischemic change. Stable ventricle size. Vascular: No hyperdense  vessels.  Carotid vascular calcification Skull: Normal. Negative for fracture or focal lesion. Sinuses/Orbits: Mild mucosal thickening in the sinuses. Chronic right nasal bone deformity. Other: None IMPRESSION: 1. No CT evidence for acute intracranial  abnormality. 2. Atrophy and chronic small vessel ischemic changes of the white matter Electronically Signed   By: Donavan Foil M.D.   On: 04/17/2019 16:07   DG Chest Port 1 View  Result Date: 04/17/2019 CLINICAL DATA:  Altered mental status, went unresponsive and fell forward striking head on bedside table while talking with staff, was unresponsive for 2 minutes EXAM: PORTABLE CHEST 1 VIEW COMPARISON:  Portable exam 1520 hours compared to 03/03/2019 FINDINGS: Enlargement of cardiac silhouette. Mediastinal contours and pulmonary vascularity normal. Atherosclerotic calcification aorta. Eventration RIGHT diaphragm unchanged. Hazy opacity at LEFT base question atelectasis versus infiltrate. Remaining lungs clear. No pleural effusion or pneumothorax. Bones demineralized with scattered endplate spur formation thoracic spine. IMPRESSION: Hazy opacity at LEFT base question atelectasis versus infiltrate. Enlargement of cardiac silhouette. Electronically Signed   By: Lavonia Dana M.D.   On: 04/17/2019 15:28    Procedures Procedures (including critical care time)  Medications Ordered in ED Medications - No data to display  ED Course  I have reviewed the triage vital signs and the nursing notes.  Pertinent labs & imaging results that were available during my care of the patient were reviewed by me and considered in my medical decision making (see chart for details).  Patient comes in with episode of somnolence at nursing home.  Per nursing home staff she was satting 33% however she is understanding where on arrival to ED.  Some concern for somnolent episode secondary to new antiepileptic medications which she has only been on for 1 day.  Patient has been  hospitalized numerous times and evaluated numerous times for her altered mental status and with a again reach baseline with no actual intervention other than time.  Suspect that this is worsening of the patient's chronic dementia.  Perhaps with some aspect of worsening mental acuity secondary to antiepileptic medications as she just started Depakote and was taken off Keppra yesterday.  Doubt sepsis, doubt hepatic encephalopathy, doubt stroke, intracranial hemorrhage, alcohol withdrawal or overdose, overmedication, electrolyte abnormality, DKA or hypoglycemia, opiate use, uremia, trauma, infection, seizure, pseudoseizure.  Clinical Course as of Apr 18 2207  Tue Apr 18, 2019  2158 Patient with mild leukocytosis also evidence of hemoconcentration as she has elevated hematocrit and RBC.  She has no fever, tachycardia or evidence of infection.  Urine pending at time of shift change is to be followed up on by provider who takes over at shift change.    [WF]  2200 CMP with mildly elevated potassium--she is not symptomatic from this.  She denies any aches or pains.  Sodium is mildly decreased as well.  No other acute abnormalities apart from mild elevation in bilirubin however she has no tenderness to palpation of the abdomen and no elevation in AST or ALT to indicate choledocholithiasis.  No evidence of Coley cystitis as she has negative Murphy sign on abdominal exam.  And no right upper quadrant pain.   [WF]  2200 -This  She is not symptomatic   [WF]  2201 Troponin x1 within normal limits  Troponin I (High Sensitivity) [WF]  2201 Ammonia and CK unremarkable.  Low suspicion for rhabdo or other metabolic encephalopathy similarly BUN and creatinine are within normal notes.   [WF]  2202 CT head is unremarkable with chronic is ischemic and atrophic changes.  No acute abnormalities.  CT HEAD WO CONTRAST [WF]  2202 EKG with no acute evidence of ischemia.  She appears to have a stable left bundle branch  block.  As this  is a bundle branch block that she has had in the past there is no evidence that this is an acute MI.  Furthermore troponin is negative.  EKG 12-Lead [WF]  2203 Discussed all lab and imaging results with my attending Dr. Sedonia Small who assessed patient at bedside.  Plan is to discharge patient home pending normal urine.   [WF]    Clinical Course User Index [WF] Tedd Sias, [place]   Patient is well-appearing on my reassessment she is more talkative and actually answering questions appropriately and following commands.  I was able to do a In-N-Out catheter without much difficulty.  Urinalysis is pending at time of shift change.  Care transferred to IV [facility] who will follow up on urinalysis results and dispel appropriately.  Anticipate patient sent home to [facility] assisted living facility. 10:09 PM   MDM Rules/Calculators/A&P                      Final Clinical Impression(s) / ED Diagnoses Final diagnoses:  Somnolence    Rx / DC Orders ED Discharge Orders    None       Tedd Sias, [place] 04/18/19 2209    Maudie Flakes, MD 04/25/19 [phone]

## 2019-04-18 NOTE — ED Triage Notes (Signed)
Arrived by [facility] from [facility]. As verbally communicated by EMS patient was found "unresponsive by staff around 1645 today O2 saturation in 30's". Patient seen at [facility] yesterday for seizure that had been under control. Patient started on Depakote yesterday. EMS reports that patient was at her baseline (confused) on arrival to facility. EMS reports placing patient on 15L NRB O2 sat 93-97%.

## 2019-04-19 NOTE — ED Notes (Signed)
Called facility to give report; no answer

## 2019-04-20 LAB — URINE CULTURE: Culture: >=100000 — AB

## 2019-04-23 LAB — URINE CULTURE: Culture: >=100000 — AB

## 2019-04-24 ENCOUNTER — Telehealth: Payer: Self-pay | Admitting: Emergency Medicine

## 2019-04-24 NOTE — Telephone Encounter (Signed)
Post ED Visit - Positive Culture Follow-up: Successful Patient Follow-Up  Culture assessed and recommendations reviewed by:  []  Elenor Quinones, Pharm.D. []  Heide Guile, Pharm.D., BCPS AQ-ID []  Parks Neptune, Pharm.D., BCPS []  Alycia Rossetti, Pharm.D., BCPS []  [place], Pharm.D., BCPS, AAHIVP []  Legrand Como, Pharm.D., BCPS, AAHIVP []  Salome Arnt, PharmD, BCPS []  Johnnette Gourd, PharmD, BCPS []  Hughes Better, PharmD, BCPS []  Leeroy Cha, PharmD  Positive urine culture  [x]  Patient discharged without antimicrobial prescription and treatment is now indicated []  Organism is resistant to prescribed ED discharge antimicrobial []  Patient with positive blood cultures  Changes discussed with ED provider: Dr Karle Starch New antibiotic prescription d/c cefdoxime, start macrobid 100mg  po bid x 5 days   [facility] @ [phone] verified identity of resident, faxed report with changes in antibiotic treatment to facility @ [phone]   Hazle Nordmann 04/24/2019, 1:29 PM

## 2019-04-24 NOTE — Progress Notes (Signed)
ED Antimicrobial Stewardship Positive Culture Follow Up   Nicole Blackwell is an 84 y.o. female who presented to [facility] on 04/18/2019 with a chief complaint of  Chief Complaint  Patient presents with  . Altered Mental Status    Recent Results (from the past 720 hour(s))  Urine culture     Status: Abnormal   Collection Time: 04/17/19  7:05 PM   Specimen: Urine, Random  Result Value Ref Range Status   Specimen Description URINE, RANDOM  Final   Special Requests NONE  Final   Culture (A)  Final    >=100,000 COLONIES/mL GRANULICATELLA ADIACENS 123XX123 COLONIES/mL ENTEROCOCCUS FAECALIS ORGANISM 1 Standardized susceptibility testing for this organism is not available. Performed at [facility], [place] [address], [place], [place] [address]    Report Status 04/20/2019 FINAL  Final   Organism ID, Bacteria ENTEROCOCCUS FAECALIS (A)  Final      Susceptibility   Enterococcus faecalis - MIC*    AMPICILLIN <=2 SENSITIVE Sensitive     NITROFURANTOIN <=16 SENSITIVE Sensitive     VANCOMYCIN 1 SENSITIVE Sensitive     * 40,000 COLONIES/mL ENTEROCOCCUS FAECALIS  Urine culture     Status: Abnormal   Collection Time: 04/18/19 10:30 PM   Specimen: Urine, Clean Catch  Result Value Ref Range Status   Specimen Description   Final    URINE, CLEAN CATCH Performed at [facility], [place] [address], [place], [place] [address]    Special Requests   Final    NONE Performed at [facility], [place] [address], [place], [place] [address]    Culture (A)  Final    >=100,000 COLONIES/mL ENTEROCOCCUS FAECALIS >=100,000 COLONIES/mL AEROCOCCUS URINAE Standardized susceptibility testing for this organism is not available. Performed at [facility], [place] [address], [place], [place] [address]    Report Status 04/23/2019 FINAL  Final   Organism ID, Bacteria ENTEROCOCCUS FAECALIS (A)  Final      Susceptibility   Enterococcus faecalis - MIC*    AMPICILLIN <=2 SENSITIVE  Sensitive     NITROFURANTOIN <=16 SENSITIVE Sensitive     VANCOMYCIN 1 SENSITIVE Sensitive     * >=100,000 COLONIES/mL ENTEROCOCCUS FAECALIS    [x]  Treated with cefpodoxime, organism resistant to prescribed antimicrobial []  Patient discharged originally without antimicrobial agent and treatment is now indicated  New antibiotic prescription: Macrobid 100 mg PO twice daily for 5 days  ED Provider: Karle Starch, MD  Julieta Bellini 04/24/2019, 10:45 AM 4th Year PharmD Candidate

## 2019-06-22 ENCOUNTER — Encounter: Payer: Self-pay | Admitting: Neurology

## 2019-06-26 ENCOUNTER — Telehealth: Payer: Self-pay | Admitting: Neurology

## 2019-06-26 ENCOUNTER — Ambulatory Visit: Payer: Medicare (Managed Care) | Admitting: Neurology

## 2019-06-26 NOTE — Telephone Encounter (Signed)
Pt called to cancel apt due to her caretaker not able to bring her in. Will not make her new pt apt at 2 pm

## 2019-08-08 ENCOUNTER — Ambulatory Visit: Payer: Medicare (Managed Care) | Admitting: Neurology

## 2019-08-23 ENCOUNTER — Telehealth: Payer: Self-pay | Admitting: Neurology

## 2019-08-23 ENCOUNTER — Ambulatory Visit: Payer: Medicare (Managed Care) | Admitting: Neurology

## 2019-08-23 NOTE — Telephone Encounter (Signed)
Patient had second no show today for new patient slot.

## 2019-08-29 ENCOUNTER — Ambulatory Visit: Payer: Medicare (Managed Care) | Admitting: Diagnostic Neuroimaging

## 2019-09-12 ENCOUNTER — Observation Stay (HOSPITAL_COMMUNITY)
Admission: EM | Admit: 2019-09-12 | Discharge: 2019-09-13 | Disposition: A | Discharge status: Home / Self Care | Payer: Medicare (Managed Care) | Attending: Internal Medicine | Admitting: Internal Medicine

## 2019-09-12 ENCOUNTER — Other Ambulatory Visit: Payer: Self-pay

## 2019-09-12 ENCOUNTER — Encounter (HOSPITAL_COMMUNITY): Payer: Self-pay | Admitting: Emergency Medicine

## 2019-09-12 ENCOUNTER — Emergency Department (HOSPITAL_COMMUNITY): Payer: Medicare (Managed Care)

## 2019-09-12 DIAGNOSIS — Z8542 Personal history of malignant neoplasm of other parts of uterus: Secondary | ICD-10-CM | POA: Diagnosis not present

## 2019-09-12 DIAGNOSIS — R404 Transient alteration of awareness: Principal | ICD-10-CM | POA: Insufficient documentation

## 2019-09-12 DIAGNOSIS — R112 Nausea with vomiting, unspecified: Secondary | ICD-10-CM | POA: Insufficient documentation

## 2019-09-12 DIAGNOSIS — F0390 Unspecified dementia without behavioral disturbance: Secondary | ICD-10-CM | POA: Insufficient documentation

## 2019-09-12 DIAGNOSIS — Z20822 Contact with and (suspected) exposure to covid-19: Secondary | ICD-10-CM | POA: Diagnosis not present

## 2019-09-12 DIAGNOSIS — I10 Essential (primary) hypertension: Secondary | ICD-10-CM | POA: Insufficient documentation

## 2019-09-12 DIAGNOSIS — R4182 Altered mental status, unspecified: Secondary | ICD-10-CM

## 2019-09-12 DIAGNOSIS — Z79899 Other long term (current) drug therapy: Secondary | ICD-10-CM | POA: Diagnosis not present

## 2019-09-12 DIAGNOSIS — G9340 Encephalopathy, unspecified: Secondary | ICD-10-CM | POA: Diagnosis present

## 2019-09-12 LAB — COMPREHENSIVE METABOLIC PANEL
ALT: 14 U/L (ref 0–44)
AST: 30 U/L (ref 15–41)
Albumin: 2.8 g/dL — ABNORMAL LOW (ref 3.5–5.0)
Alkaline Phosphatase: 57 U/L (ref 38–126)
Anion gap: 8 (ref 5–15)
BUN: 13 mg/dL (ref 8–23)
CO2: 28 mmol/L (ref 22–32)
Calcium: 8.3 mg/dL — ABNORMAL LOW (ref 8.9–10.3)
Chloride: 105 mmol/L (ref 98–111)
Creatinine, Ser: 0.79 mg/dL (ref 0.44–1.00)
GFR calc Af Amer: >60 mL/min (ref >60)
GFR calc non Af Amer: >60 mL/min (ref >60)
Glucose, Bld: 126 mg/dL — ABNORMAL HIGH (ref 70–99)
Potassium: 4.6 mmol/L (ref 3.5–5.1)
Sodium: 141 mmol/L (ref 135–145)
Total Bilirubin: 0.9 mg/dL (ref 0.3–1.2)
Total Protein: 5.6 g/dL — ABNORMAL LOW (ref 6.5–8.1)

## 2019-09-12 LAB — URINALYSIS, ROUTINE W REFLEX MICROSCOPIC
Bilirubin Urine: NEGATIVE
Glucose, UA: NEGATIVE mg/dL
Hgb urine dipstick: NEGATIVE
Ketones, ur: NEGATIVE mg/dL
Leukocytes,Ua: NEGATIVE
Nitrite: NEGATIVE
Protein, ur: NEGATIVE mg/dL
Specific Gravity, Urine: 1.019 (ref 1.005–1.030)
pH: 8 (ref 5.0–8.0)

## 2019-09-12 LAB — CBC WITH DIFFERENTIAL/PLATELET
Abs Immature Granulocytes: 0.03 10*3/uL (ref 0.00–0.07)
Basophils Absolute: 0 10*3/uL (ref 0.0–0.1)
Basophils Relative: 0 %
Eosinophils Absolute: 0 10*3/uL (ref 0.0–0.5)
Eosinophils Relative: 0 %
HCT: 42.1 % (ref 36.0–46.0)
Hemoglobin: 12.5 g/dL (ref 12.0–15.0)
Immature Granulocytes: 0 %
Lymphocytes Relative: 14 %
Lymphs Abs: 1.4 10*3/uL (ref 0.7–4.0)
MCH: 25.7 pg — ABNORMAL LOW (ref 26.0–34.0)
MCHC: 29.7 g/dL — ABNORMAL LOW (ref 30.0–36.0)
MCV: 86.4 fL (ref 80.0–100.0)
Monocytes Absolute: 0.4 10*3/uL (ref 0.1–1.0)
Monocytes Relative: 4 %
Neutro Abs: 8.1 10*3/uL — ABNORMAL HIGH (ref 1.7–7.7)
Neutrophils Relative %: 82 %
Platelets: 263 10*3/uL (ref 150–400)
RBC: 4.87 MIL/uL (ref 3.87–5.11)
RDW: 15.4 % (ref 11.5–15.5)
WBC: 9.9 10*3/uL (ref 4.0–10.5)
nRBC: 0 % (ref 0.0–0.2)

## 2019-09-12 LAB — LIPASE, BLOOD: Lipase: 41 U/L (ref 11–51)

## 2019-09-12 LAB — AMMONIA: Ammonia: 20 umol/L (ref 9–35)

## 2019-09-12 LAB — TSH: TSH: 4.56 u[IU]/mL — ABNORMAL HIGH (ref 0.350–4.500)

## 2019-09-12 LAB — LACTIC ACID, PLASMA
Lactic Acid, Venous: 2.1 mmol/L (ref 0.5–1.9)
Lactic Acid, Venous: 4 mmol/L (ref 0.5–1.9)

## 2019-09-12 IMAGING — CT CT HEAD W/O CM
4 of 5 series · 14 of 47 positions shown, 16 images · non-contrast
Comparison: [DATE]

CLINICAL DATA: Altered mental status

EXAM:
CT HEAD WITHOUT CONTRAST
TECHNIQUE: Contiguous axial images were obtained from the base of the skull
through the vertex without intravenous contrast.

[Series 3: head without · axial · non-contrast · 0.45mm/px · z∈[-100,-35]mm · 3 of 34 slices shown]
[im 7/34  brain]
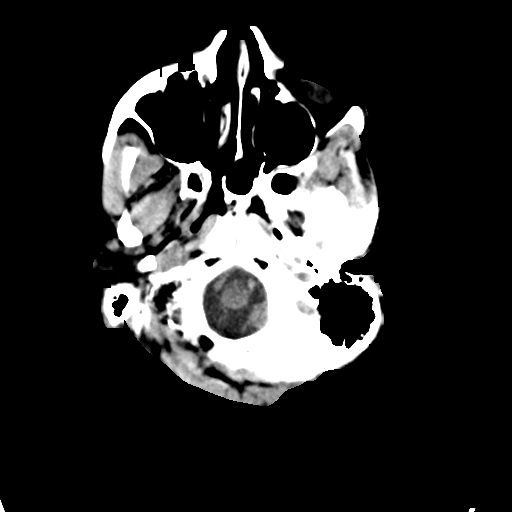
[im 14/34  brain]
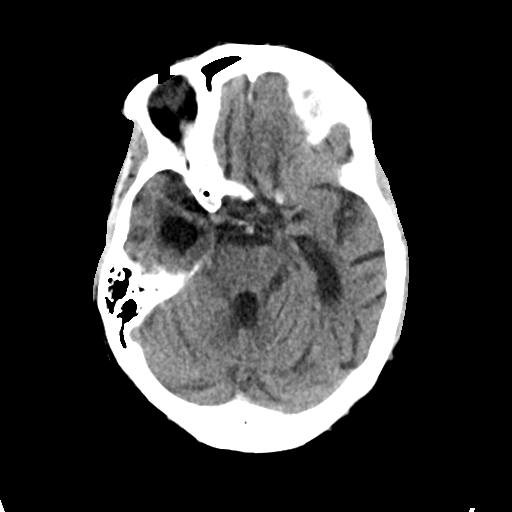
[im 20/34  brain]
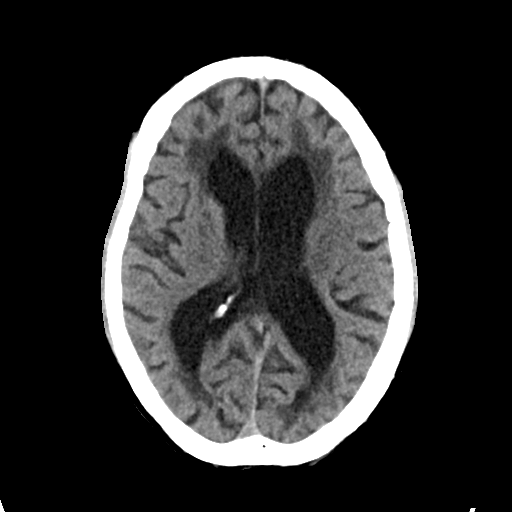

[Series 5: head without cor · coronal · non-contrast · 0.30mm/px · 3 of 66 slices shown]
[im 22/66  brain]
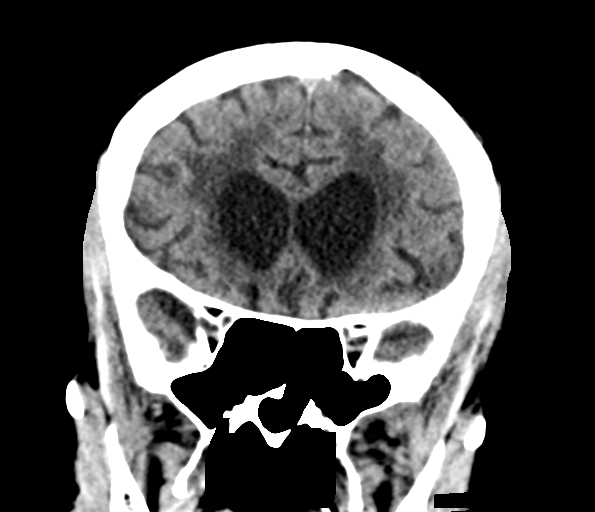
[im 29/66  brain]
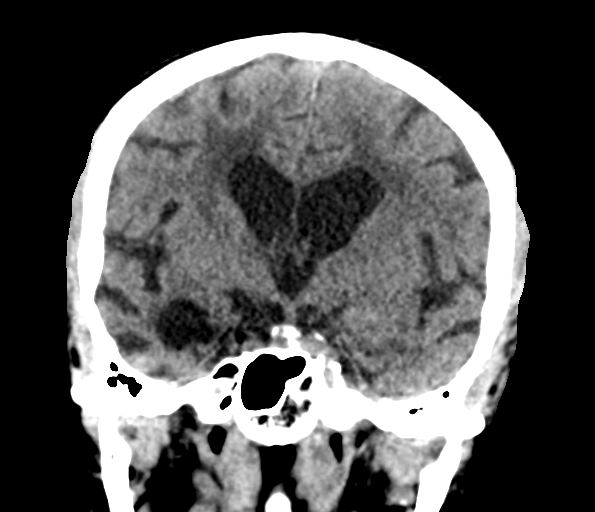
[im 37/66  brain]
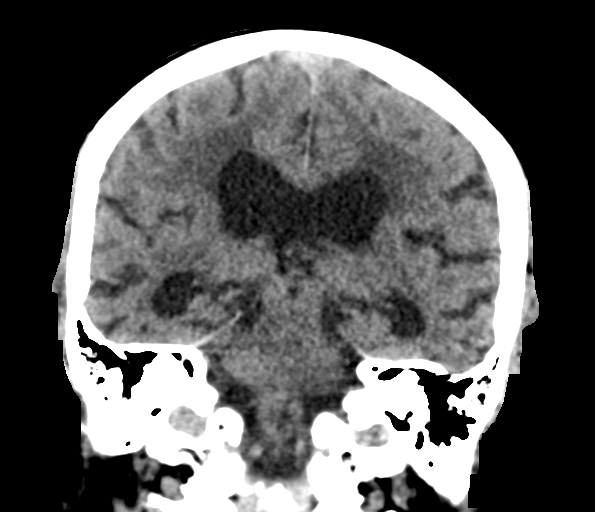

[Series 6: head without sag · sagittal · non-contrast · 0.31mm/px · 3 of 53 slices shown]
[im 18/53  brain]
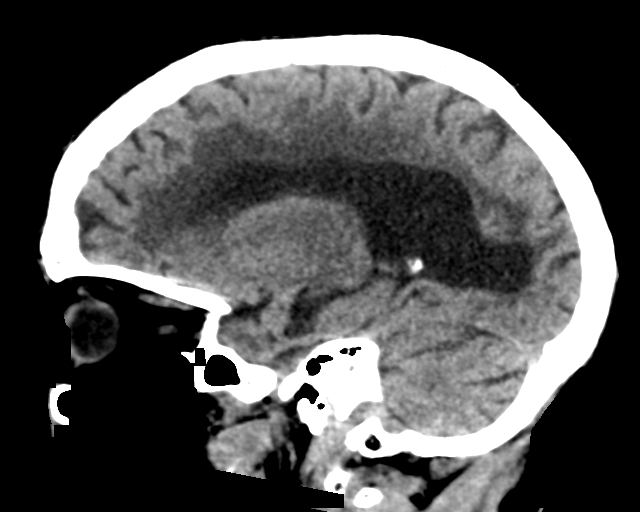
[im 27/53  brain]
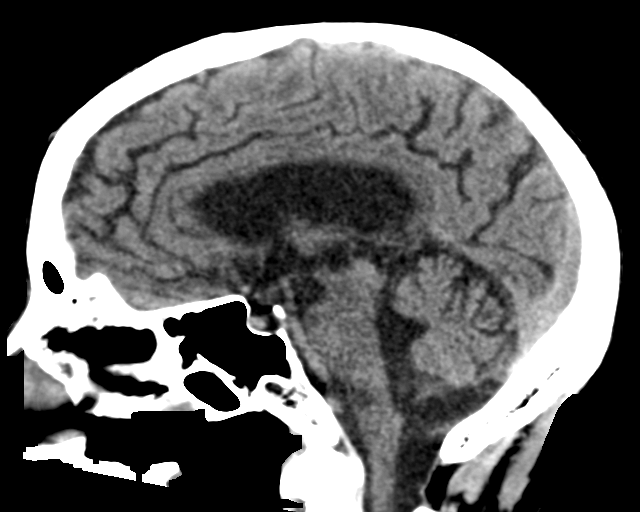
[im 35/53  brain]
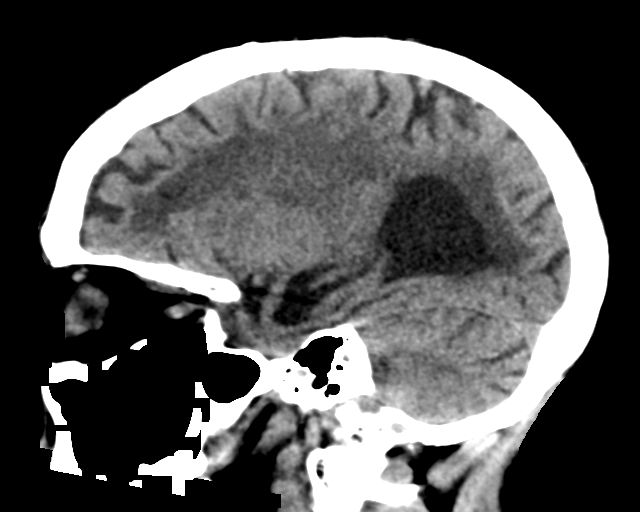

[Series 7: head without ax · axial · non-contrast · 0.35mm/px · z∈[-92,+15]mm · 5 of 34 slices shown, 7 images]
[im 6/34  brain]
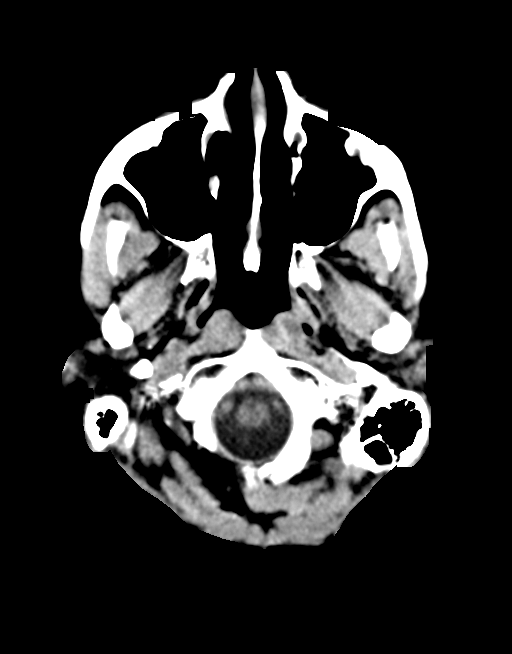
[im 6/34  bone]
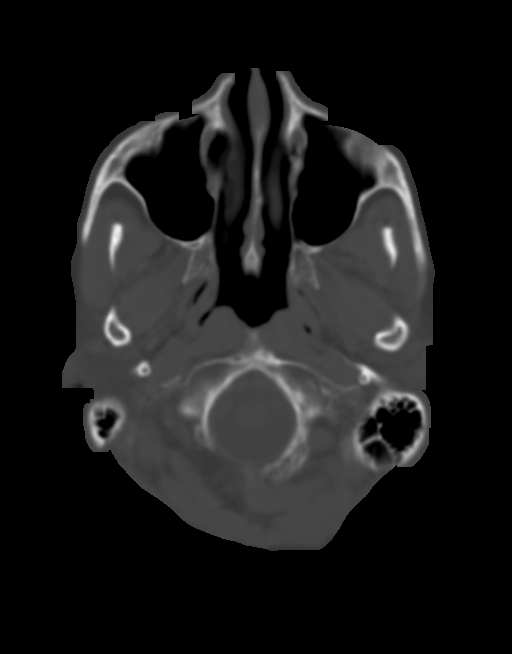
[im 12/34  brain]
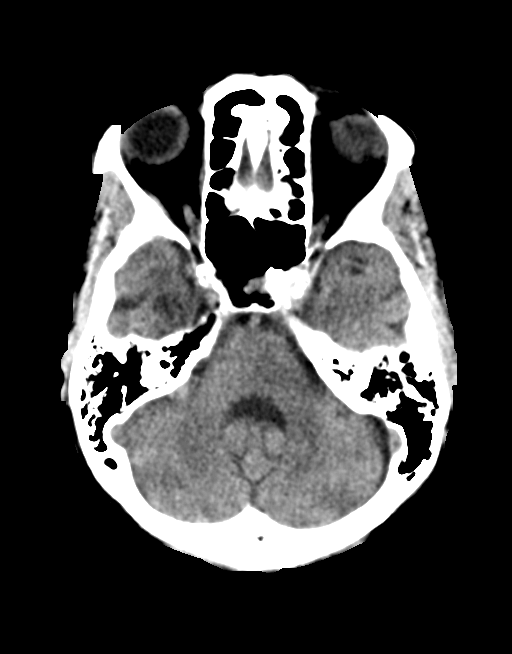
[im 17/34  brain]
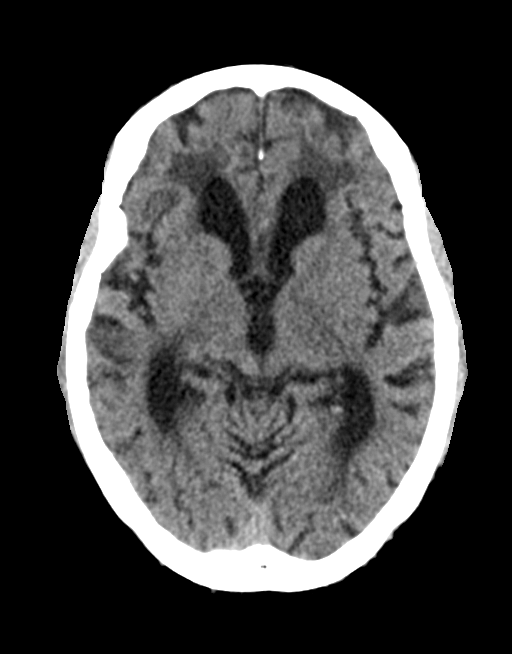
[im 23/34  brain]
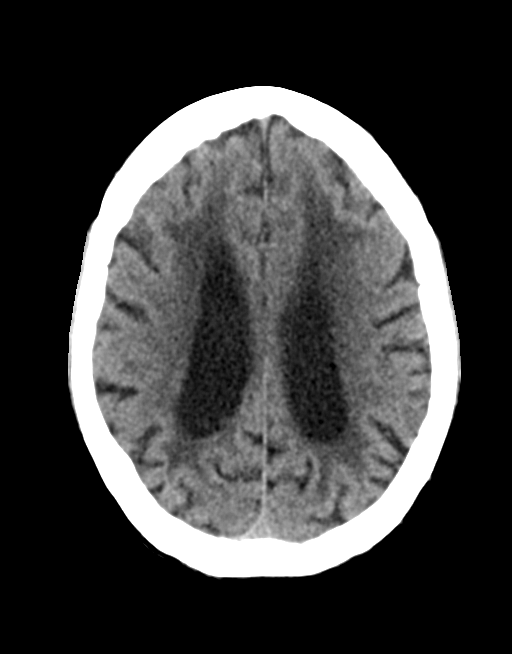
[im 28/34  brain]
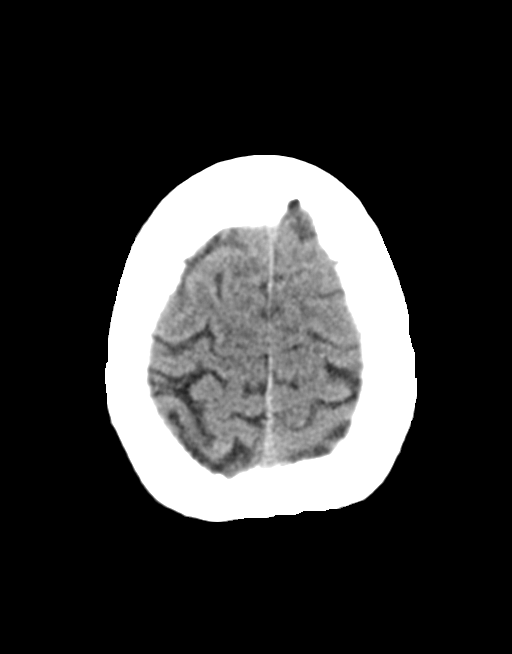
[im 28/34  bone]
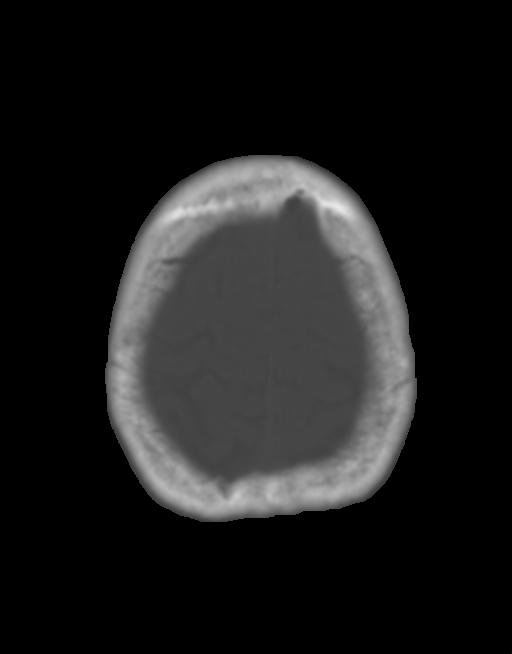

[14 of 47 positions shown; findings below may reference images not displayed]

FINDINGS: Brain: There is normal anatomic configuration of the brain. Mild
parenchymal volume loss is commensurate with the patient's age. Mild
ventriculomegaly is stable and likely reflects the sequela of
central atrophy. Extensive periventricular and subcortical white
matter changes are present likely reflecting the sequela of small
vessel ischemia.

No evidence of acute intracranial hemorrhage or infarct. No abnormal
mass effect or midline shift. No abnormal intra or extra-axial mass
lesion or fluid collection. And in the cerebellum is unremarkable.

Vascular: Mild vascular calcifications are noted within the carotid
siphons. No hyperdense vasculature at the skull base.

Skull: Intact

Sinuses/Orbits: The paranasal sinuses are clear. The orbits are
unremarkable

Other: Mastoid air cells and middle ear cavities are clear per
IMPRESSION: Advanced senescent changes. No evidence of acute intracranial
hemorrhage or infarct.

## 2019-09-12 IMAGING — DX DG CHEST 1V PORT
1 series · 1 of 1 positions shown · non-contrast
Comparison: [DATE]

CLINICAL DATA: Transient altered mental status

EXAM:
PORTABLE CHEST 1 VIEW

[chest ap]
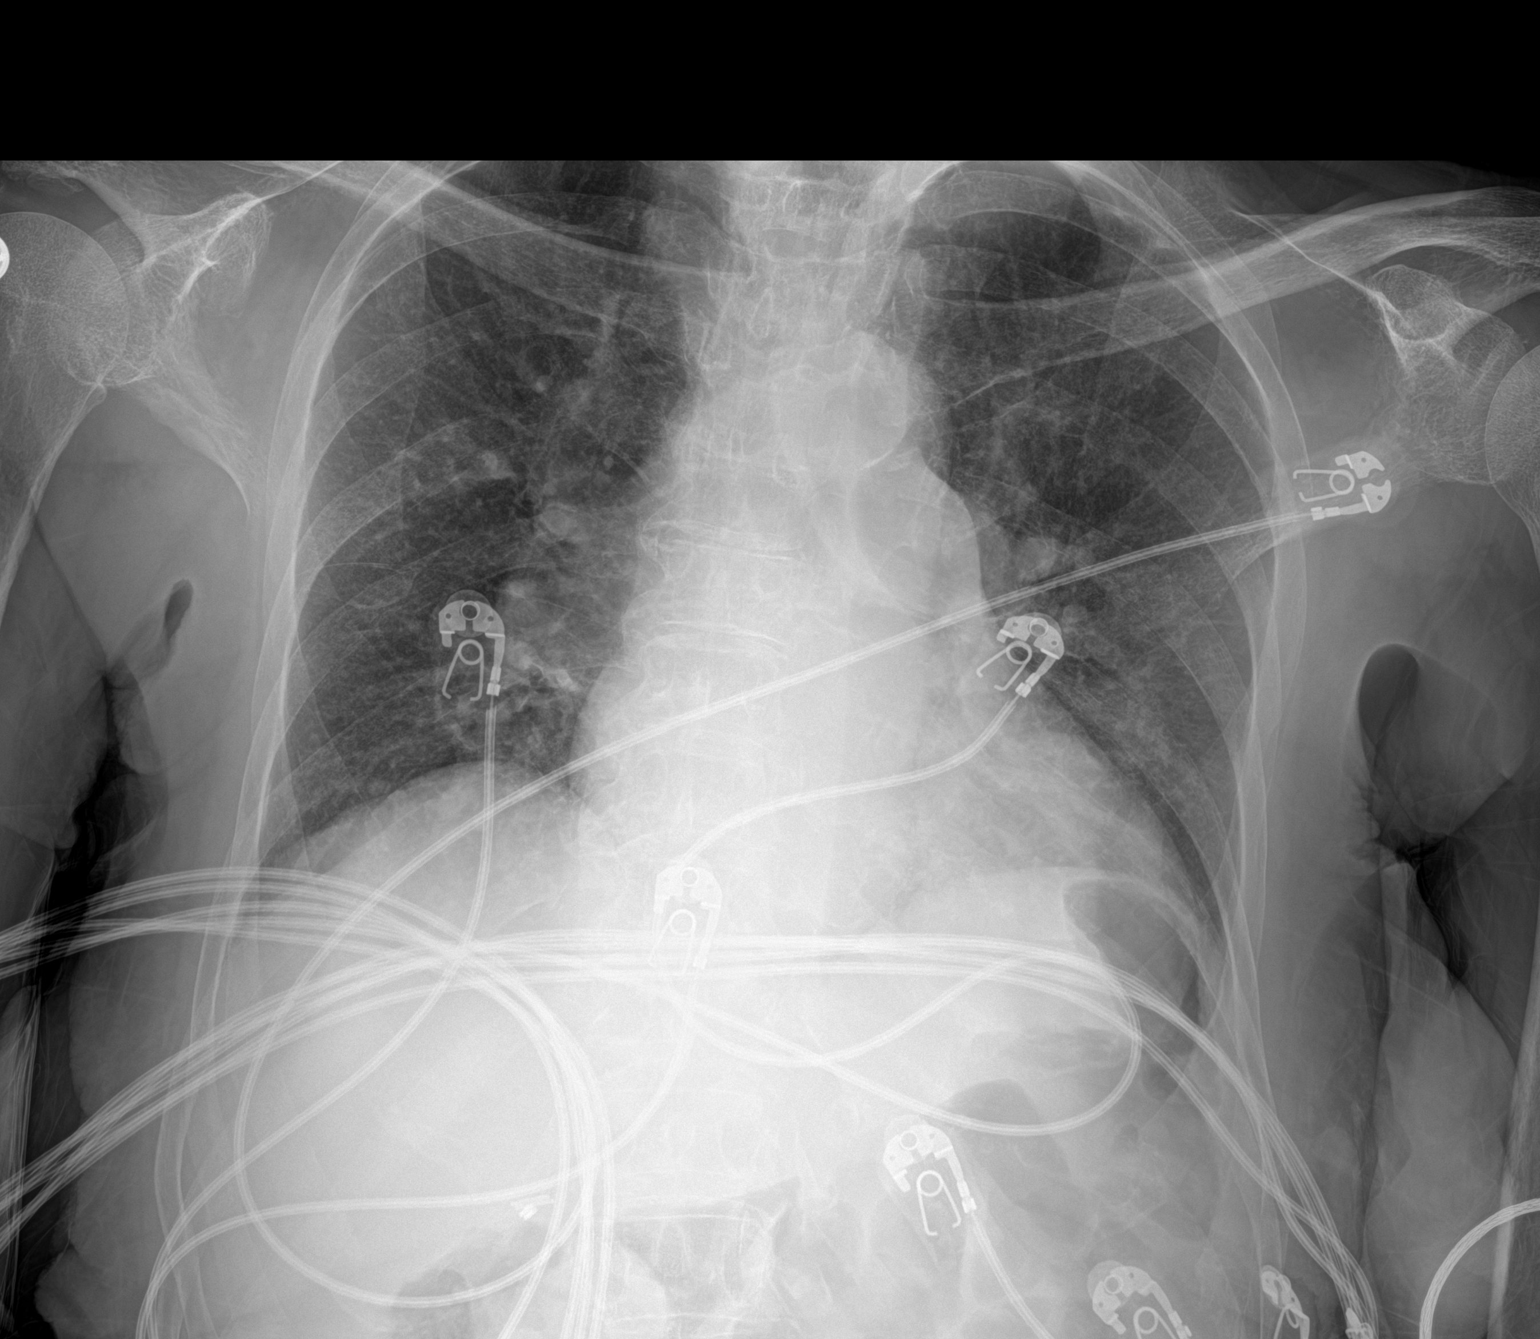

[1 of 1 positions shown; findings below may reference images not displayed]

FINDINGS: Mildly diminished lung volumes. Borderline cardiomegaly with aortic
atherosclerosis. No consolidation or effusion. No pneumothorax.
IMPRESSION: No active disease. Borderline cardiomegaly.

## 2019-09-12 IMAGING — MR MR HEAD W/O CM
10 of 11 series · 43 of 48 positions shown · non-contrast
Comparison: Prior CT from [DATE].

CLINICAL DATA: Initial evaluation for acute altered mental status.

EXAM:
MRI HEAD WITHOUT CONTRAST
TECHNIQUE: Multiplanar, multiecho pulse sequences of the brain and surrounding
structures were obtained without intravenous contrast.

[Series 13: T2 · axial · 5.0mm · 0.72mm/px · z∈[-57,+85]mm · 3 of 25 slices shown (1 of 2)]
[im 1/25]
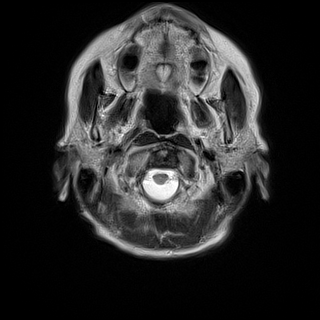
[im 13/25]
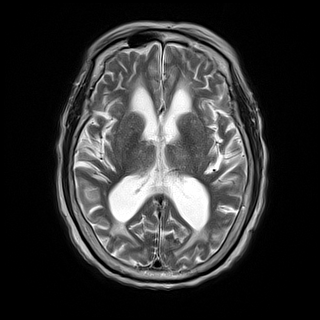
[im 25/25]
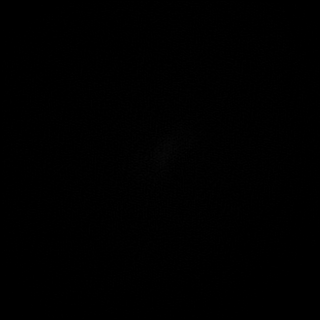

[Series 14: DWI · axial · 3.0mm · 0.88mm/px · z∈[-57,+85]mm · 9 of 97 slices shown (1 of 4)]
[im 1/97]
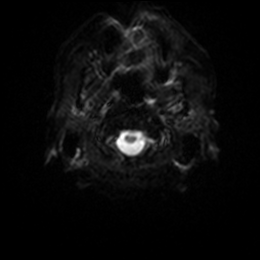
[im 13/97]
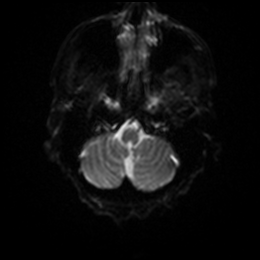
[im 25/97]
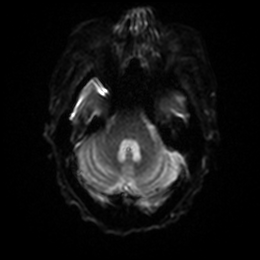
[im 37/97]
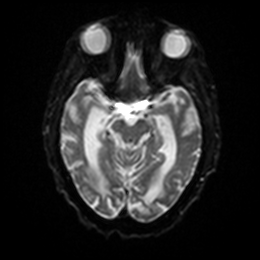
[im 49/97]
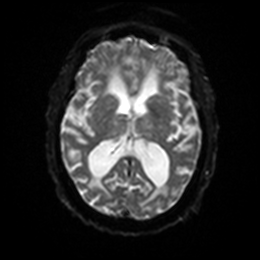
[im 61/97]
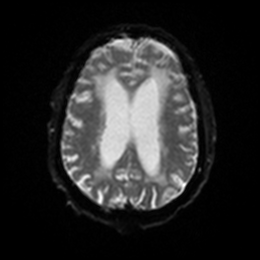
[im 73/97]
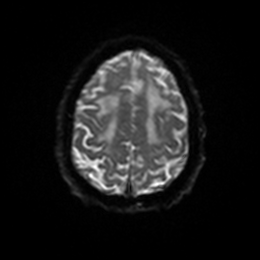
[im 85/97]
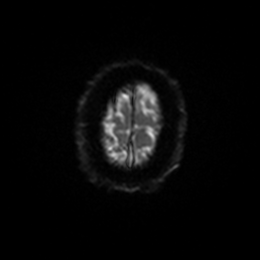
[im 97/97]
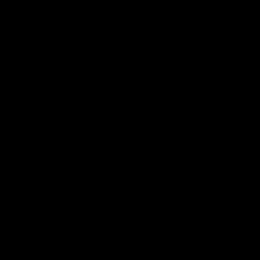

[Series 15: DWI · axial · 3.0mm · 0.88mm/px · z∈[-57,+82]mm · 4 of 48 slices shown (2 of 4)]
[im 1/48]
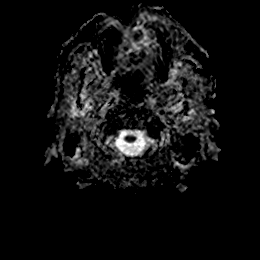
[im 16/48]
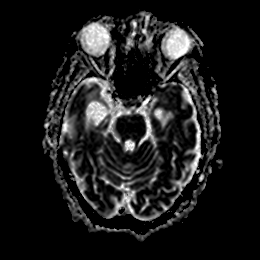
[im 32/48]
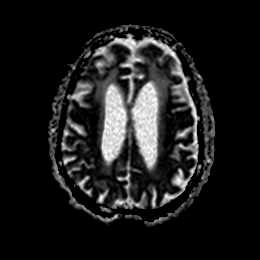
[im 48/48]
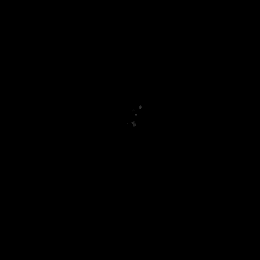

[Series 16: DWI · coronal · 4.0mm · 0.88mm/px · 6 of 66 slices shown (3 of 4)]
[im 1/66]
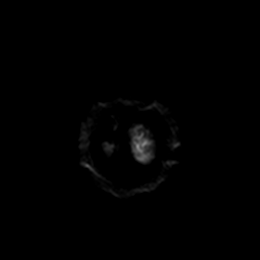
[im 14/66]
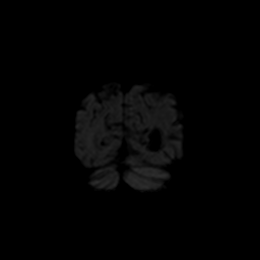
[im 27/66]
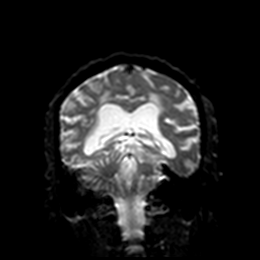
[im 40/66]
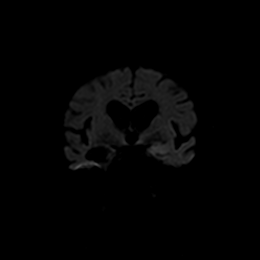
[im 53/66]
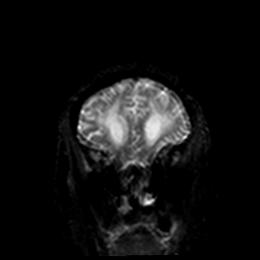
[im 66/66]
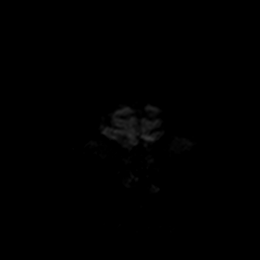

[Series 17: DWI · coronal · 4.0mm · 0.88mm/px · 3 of 33 slices shown (4 of 4)]
[im 1/33]
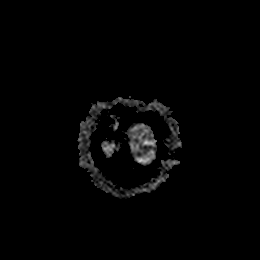
[im 17/33]
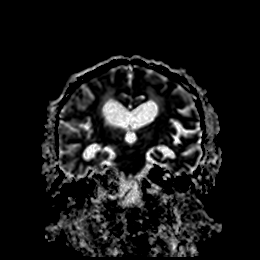
[im 33/33]
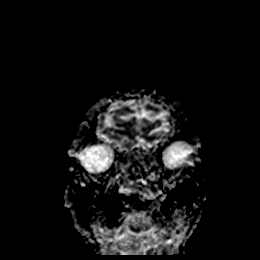

[Series 18: T1 · sagittal · 5.0mm · 0.75mm/px · 2 of 23 slices shown]
[im 1/23]
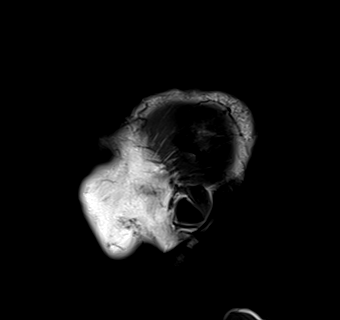
[im 23/23]
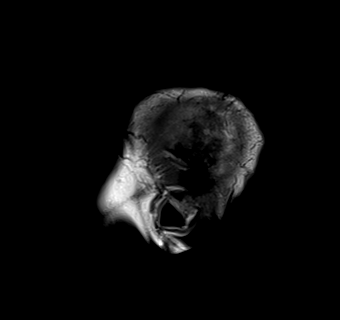

[Series 19: FLAIR · axial · 5.0mm · 0.45mm/px · z∈[-59,+83]mm · 2 of 25 slices shown]
[im 1/25]
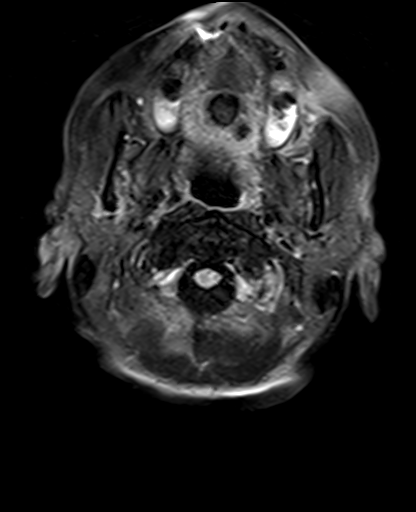
[im 25/25]
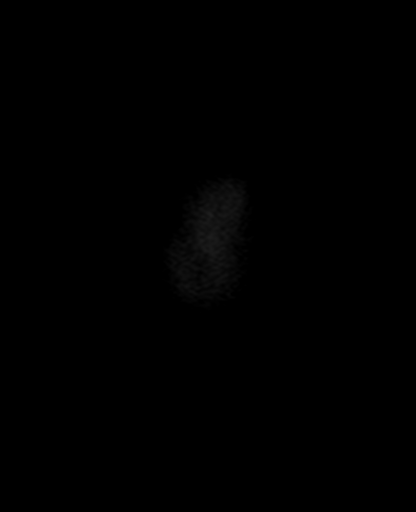

[Series 21: pha_images · axial · 3.0mm · 0.90mm/px · z∈[-76,+87]mm · 5 of 55 slices shown]
[im 1/55]
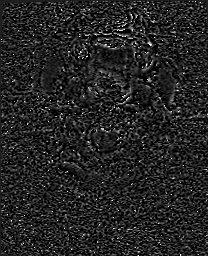
[im 14/55]
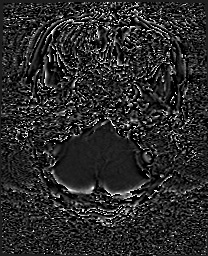
[im 28/55]
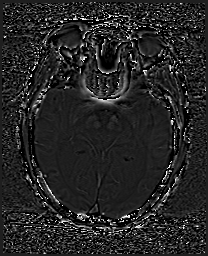
[im 41/55]
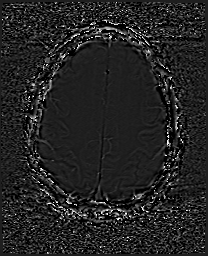
[im 55/55]
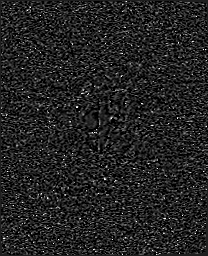

[Series 22: swi_images · axial · 3.0mm · 0.90mm/px · z∈[-76,+99]mm · 6 of 60 slices shown]
[im 1/60]
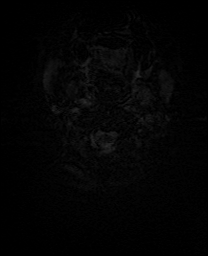
[im 12/60]
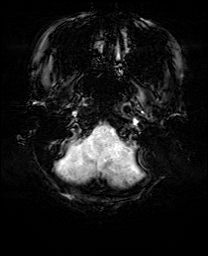
[im 24/60]
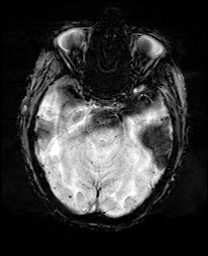
[im 36/60]
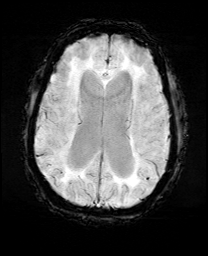
[im 48/60]
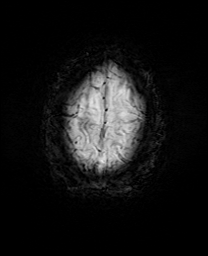
[im 60/60]
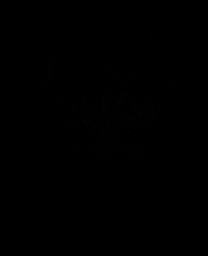

[Series 25: T2 · coronal · 5.0mm · 0.34mm/px · 3 of 29 slices shown (2 of 2)]
[im 1/29]
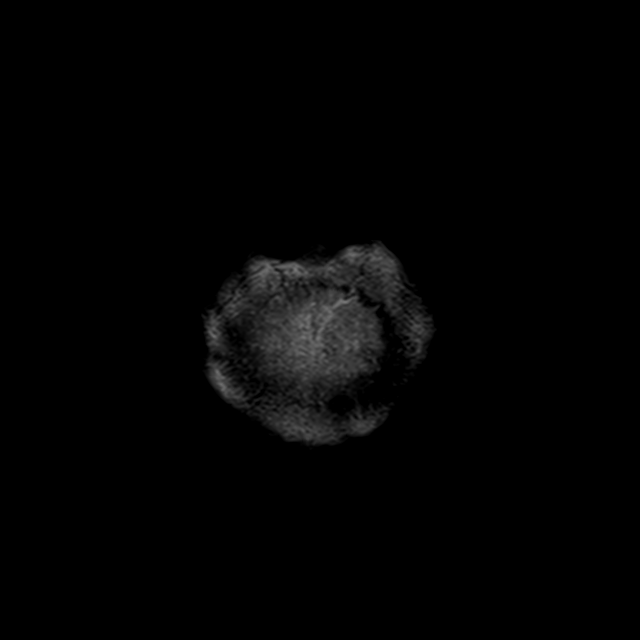
[im 15/29]
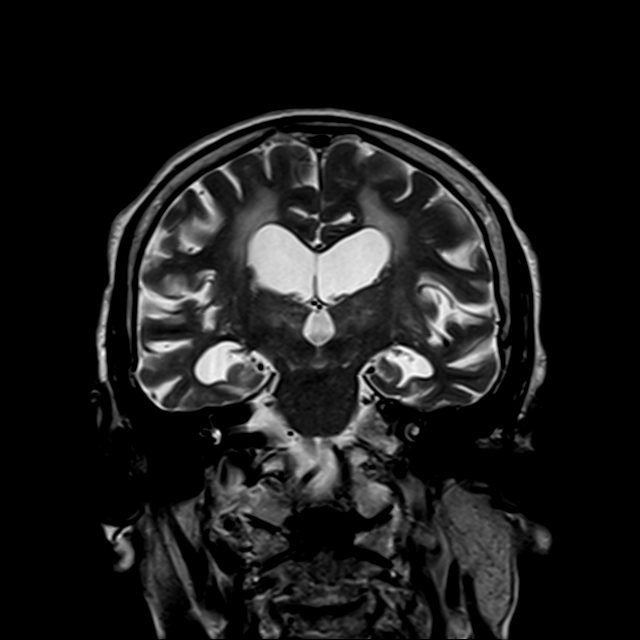
[im 29/29]
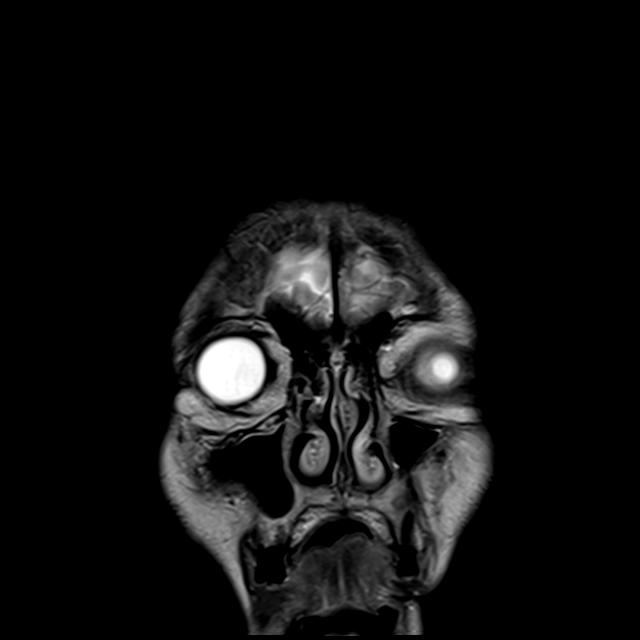

[43 of 48 positions shown; findings below may reference images not displayed]

FINDINGS: Brain: Diffuse prominence of the CSF containing spaces compatible
with advanced cerebral atrophy. Changes most pronounced at the
temporal lobes bilaterally. Extensive patchy and confluent T2/FLAIR
hyperintensity within the periventricular deep white matter both
cerebral hemispheres consistent with moderate to advanced chronic
microvascular ischemic disease.

No abnormal foci of restricted diffusion to suggest acute or
subacute ischemia. Gray-white matter differentiation maintained. No
evidence for acute intracranial hemorrhage. Single punctate chronic
microhemorrhage noted within the right occipital lobe, of doubtful
significance in isolation.

No mass lesion, midline shift or mass effect. Diffuse ventricular
prominence related to global parenchymal volume loss without
hydrocephalus. No extra-axial fluid collection. Pituitary gland
suprasellar region normal. Midline structures intact.

Vascular: Major intracranial vascular flow voids are maintained.

Skull and upper cervical spine: Craniocervical junction within
normal limits. Multilevel degenerative spondylosis noted within the
upper cervical spine without high-grade stenosis. Bone marrow signal
intensity normal. No scalp soft tissue abnormality.

Sinuses/Orbits: Patient status post bilateral ocular lens
replacement. Globes and orbital soft tissues demonstrate no acute
finding. Paranasal sinuses are clear. No mastoid effusion. Inner ear
structures grossly normal.

Other: None.
IMPRESSION: 1. No acute intracranial abnormality.
2. Advanced cerebral atrophy with chronic microvascular ischemic
disease.

## 2019-09-12 MED ORDER — SODIUM CHLORIDE 0.9 % IV BOLUS
1000.0000 mL | Freq: Once | INTRAVENOUS | Status: AC
  Administered 2019-09-12: 1000 mL via INTRAVENOUS

## 2019-09-12 MED ORDER — LEVETIRACETAM IN NACL 1000 MG/100ML IV SOLN
1000.0000 mg | Freq: Once | INTRAVENOUS | Status: AC
  Administered 2019-09-13: 1000 mg via INTRAVENOUS
  Filled 2019-09-12: qty 100

## 2019-09-12 NOTE — ED Triage Notes (Signed)
Pt's daughter reports pt went to [facility], a/o per her baseline. When pt returned from [facility] pt was altered, ems reported pt unable to answer orientation questions. On arrival pt able to say name and knows she is in the hospital. Had d/v episode while at [facility]

## 2019-09-12 NOTE — ED Provider Notes (Signed)
She is [facility] Provider Note   CSN: [id number] Arrival date & time: 09/12/19  1913     History Chief Complaint  Patient presents with  . Altered Mental Status    Nicole Blackwell is a 84 y.o. female.  The history is provided by the patient and medical records. No language interpreter was used.  Altered Mental Status Presenting symptoms: confusion and disorientation   Severity:  Moderate Most recent episode:  Today Episode history:  Single Timing:  Intermittent Progression:  Resolved Chronicity:  Recurrent Context: dementia   Associated symptoms: bladder incontinence, nausea, seizures and vomiting   Associated symptoms: no abdominal pain, normal movement, no depression, no difficulty breathing, no fever, no light-headedness, no palpitations, no rash and no weakness        Past Medical History:  Diagnosis Date  . BACK PAIN   . Hypertension   . OSTEOPOROSIS   . PLANTAR FASCIITIS   . Positive PPD   . Seizures ([place])   . SHINGLES   . SYMPTOMATIC MENOPAUSAL/FEMALE CLIMACTERIC STATES   . Syncope and collapse 12/13/2014  . Uterine cancer [facility])     Patient Active Problem List   Diagnosis Date Noted  . Orthostatic hypotension   . Unsteadiness 02/28/2019  . Suspected cerebrovascular accident (CVA) 02/28/2019  . Acute encephalopathy 02/24/2019  . Lethargy 02/24/2019  . AMS (altered mental status) 12/17/2018  . Fatigue 08/27/2018  . Protein calorie malnutrition ([place]) 05/24/2018  . Hypoalbuminemia 05/23/2018  . Abnormal EEG 05/22/2018  . Dementia ([place]) 05/21/2018  . Fall 05/20/2018  . Rhabdomyolysis 05/20/2018  . Bilateral lower extremity edema 05/20/2018  . Serum total bilirubin elevated 05/20/2018  . B12 deficiency 01/17/2018  . Vascular dementia without behavioral disturbance ([place]) 01/17/2018  . Laceration of scalp without foreign body   . Chronic diastolic CHF (congestive heart failure) ([place]) 12/13/2014  . Syncope  01/12/2012  . BACK PAIN 04/30/2008  . SHINGLES 09/27/2007  . SYMPTOMATIC MENOPAUSAL/FEMALE CLIMACTERIC STATES 06/20/2007  . Osteoporosis 03/11/2007  . PLANTAR FASCIITIS 11/16/2006    Past Surgical History:  Procedure Laterality Date  . CATARACT EXTRACTION, BILATERAL Bilateral   . DILATION AND CURETTAGE OF UTERUS  1960's X 2   "I lost 2 children to miscarriage"  . LAPAROSCOPIC CHOLECYSTECTOMY    . SIGMOIDOSCOPY    . TONSILLECTOMY    . VAGINAL HYSTERECTOMY       OB History   No obstetric history on file.     Family History  Problem Relation Age of Onset  . Healthy Mother   . Healthy Father     Social History   Tobacco Use  . Smoking status: Never Smoker  . Smokeless tobacco: Never Used  Vaping Use  . Vaping Use: Never used  Substance Use Topics  . Alcohol use: No  . Drug use: No    Home Medications Prior to Admission medications   Medication Sig Start Date End Date Taking? Authorizing Provider  aspirin EC 81 MG tablet Take 81 mg by mouth at bedtime.    [provider]  cefpodoxime (VANTIN) 100 MG tablet Take 1 tablet (100 mg total) by mouth 2 (two) times daily. 04/18/19   Margarita Mail, PA-C  divalproex (DEPAKOTE) 500 MG DR tablet Take 1 tablet (500 mg total) by mouth 2 (two) times daily. Patient taking differently: Take 250 mg by mouth 2 (two) times daily.  04/17/19   Quintella Reichert, MD  levETIRAcetam (KEPPRA) 250 MG tablet Take 1 tablet (250 mg  total) by mouth 2 (two) times daily. Patient not taking: Reported on 04/18/2019 03/01/19   Al Decant, MD  losartan (COZAAR) 25 MG tablet TAKE 1 TABLET(25 MG) BY MOUTH DAILY Patient taking differently: Take 25 mg by mouth daily.  09/18/18   Billie Ruddy, MD  vitamin B-12 (CYANOCOBALAMIN) 100 MCG tablet Take 100 mcg by mouth daily.    [provider]  Vitamin D, Ergocalciferol, (DRISDOL) 1.25 MG (50000 UNIT) CAPS capsule Take 50,000 Units by mouth every 7 (seven) days.    [provider]     Allergies    Penicillin g benzathine and Penicillins  Review of Systems   Review of Systems  Constitutional: Negative for chills, diaphoresis, fatigue and fever.  HENT: Negative for congestion.   Eyes: Negative for visual disturbance.  Respiratory: Negative for cough, chest tightness, shortness of breath and wheezing.   Cardiovascular: Negative for chest pain, palpitations and leg swelling.  Gastrointestinal: Positive for nausea and vomiting. Negative for abdominal pain, constipation and diarrhea.  Genitourinary: Positive for bladder incontinence. Negative for dysuria, flank pain and frequency.  Musculoskeletal: Negative for back pain, neck pain and neck stiffness.  Skin: Negative for rash and wound.  Neurological: Positive for seizures. Negative for dizziness, weakness, light-headedness and numbness.  Psychiatric/Behavioral: Positive for confusion.  All other systems reviewed and are negative.   Physical Exam Updated Vital Signs BP 122/66 (BP Location: Right Arm)   Pulse 77   Temp 98.8 F (37.1 C) (Oral)   Resp 11   Ht 5\' 4"  (1.626 m)   Wt 66 kg   SpO2 100%   BMI 24.98 kg/m   Physical Exam Vitals and nursing note reviewed.  Constitutional:      General: She is not in acute distress.    Appearance: She is well-developed. She is not ill-appearing, toxic-appearing or diaphoretic.  HENT:     Head: Normocephalic and atraumatic.  Eyes:     Conjunctiva/sclera: Conjunctivae normal.  Cardiovascular:     Rate and Rhythm: Normal rate and regular rhythm.     Pulses: Normal pulses.     Heart sounds: No murmur heard.   Pulmonary:     Effort: Pulmonary effort is normal. No respiratory distress.     Breath sounds: Normal breath sounds. No wheezing, rhonchi or rales.  Chest:     Chest wall: No tenderness.  Abdominal:     General: Abdomen is flat. There is no distension.     Palpations: Abdomen is soft.     Tenderness: There is no abdominal tenderness. There is no right CVA  tenderness or left CVA tenderness.  Musculoskeletal:     Cervical back: Neck supple.  Skin:    General: Skin is warm and dry.     Capillary Refill: Capillary refill takes less than 2 seconds.  Neurological:     Mental Status: She is alert.  Psychiatric:        Mood and Affect: Mood normal.     ED Results / Procedures / Treatments   Labs (all labs ordered are listed, but only abnormal results are displayed) Labs Reviewed  TSH - Abnormal; Notable for the following components:      Result Value   TSH 4.560 (*)    All other components within normal limits  COMPREHENSIVE METABOLIC PANEL - Abnormal; Notable for the following components:   Glucose, Bld 126 (*)    Calcium 8.3 (*)    Total Protein 5.6 (*)    Albumin 2.8 (*)  All other components within normal limits  CBC WITH DIFFERENTIAL/PLATELET - Abnormal; Notable for the following components:   MCH 25.7 (*)    MCHC 29.7 (*)    Neutro Abs 8.1 (*)    All other components within normal limits  LACTIC ACID, PLASMA - Abnormal; Notable for the following components:   Lactic Acid, Venous 2.1 (*)    All other components within normal limits  LACTIC ACID, PLASMA - Abnormal; Notable for the following components:   Lactic Acid, Venous 4.0 (*)    All other components within normal limits  URINE CULTURE  AMMONIA  LIPASE, BLOOD  URINALYSIS, ROUTINE W REFLEX MICROSCOPIC    EKG EKG Interpretation  Date/Time:  Tuesday September 12 2019 19:45:40 EDT Ventricular Rate:  80 PR Interval:    QRS Duration: 145 QT Interval:  450 QTC Calculation: 520 R Axis:   55 Text Interpretation: Sinus rhythm Left bundle branch block When compared to prior, similar appearance to prior. t wave more inverted in v6 from last ECG but simiular to older ones No STEMI Confirmed by Antony Blackbird [phone]) on 09/12/2019 8:24:45 PM   Radiology CT Head Wo Contrast  Result Date: 09/12/2019 CLINICAL DATA:  Altered mental status EXAM: CT HEAD WITHOUT CONTRAST  TECHNIQUE: Contiguous axial images were obtained from the base of the skull through the vertex without intravenous contrast. COMPARISON:  04/18/2019 FINDINGS: Brain: There is normal anatomic configuration of the brain. Mild parenchymal volume loss is commensurate with the patient's age. Mild ventriculomegaly is stable and likely reflects the sequela of central atrophy. Extensive periventricular and subcortical white matter changes are present likely reflecting the sequela of small vessel ischemia. No evidence of acute intracranial hemorrhage or infarct. No abnormal mass effect or midline shift. No abnormal intra or extra-axial mass lesion or fluid collection. And in the cerebellum is unremarkable. Vascular: Mild vascular calcifications are noted within the carotid siphons. No hyperdense vasculature at the skull base. Skull: Intact Sinuses/Orbits: The paranasal sinuses are clear. The orbits are unremarkable Other: Mastoid air cells and middle ear cavities are clear per IMPRESSION: Advanced senescent changes. No evidence of acute intracranial hemorrhage or infarct. Electronically Signed   By: Fidela Salisbury MD   On: 09/12/2019 20:36   DG Chest Portable 1 View  Result Date: 09/12/2019 CLINICAL DATA:  Transient altered mental status EXAM: PORTABLE CHEST 1 VIEW COMPARISON:  04/17/2019 FINDINGS: Mildly diminished lung volumes. Borderline cardiomegaly with aortic atherosclerosis. No consolidation or effusion. No pneumothorax. IMPRESSION: No active disease. Borderline cardiomegaly. Electronically Signed   By: Donavan Foil M.D.   On: 09/12/2019 19:51    Procedures Procedures (including critical care time)  Medications Ordered in ED Medications  sodium chloride 0.9 % bolus 1,000 mL (1,000 mLs Intravenous New Bag/Given 09/12/19 2248)    ED Course  I have reviewed the triage vital signs and the nursing notes.  Pertinent labs & imaging results that were available during my care of the patient were reviewed by  me and considered in my medical decision making (see chart for details).    MDM Rules/Calculators/A&P                          Nicole Blackwell is a 84 y.o. female with a past medical history significant for prior encephalopathy, stroke, CHF, status post hysterectomy, seizures, prior uterine osteoporosis, hypertension, prior cholecystectomy, who presents for transient altered mental status.  According to EMS report, patient was at her baseline this morning  around 9 AM but then went to [facility].  When patient returned this evening, she was not talking as much and was acting confused.  This is atypical for her.  On arrival to the emergency department, patient is answering all questions appropriately.  She knows her name and location.  She denies any complaints whatsoever.  She denies any headache, neck pain, neck stiffness, vision changes, nausea, vomiting, urinary symptoms, constipation, diarrhea, nausea, or vomiting now.  She did reportedly have an episode of diarrhea and nausea and vomiting at [facility] today.  She denies any complaints and feels like she is at her baseline.  On exam, lungs are clear and chest is nontender.  Abdomen is nontender.  He is moving all extremities.  Normal sensation oximetry.  No facial droop seen.  Pupils are symmetric and reactive normal extraocular movements.  Patient has normal smile.  No tenderness present on exam and no evidence of trauma seen on my initial skin survey.  Chart review shows that patient has had episodes of encephalopathy in the past.  We will get a work-up to look for occult infection that contributed this.  Given her lack of neck pain, neck stiffness, fevers, chills, or headache, low suspicion for meningitis at this time.  Will get urinalysis, chest x-ray, and labs.  We also get a head CT given her history of possible stroke.  She has not had any seizure activity here but will watch for this.  Anticipate reassessment after work-up to determine Disposition.         11:29 PM CT head showed advanced senescent changes but no evidence of acute intracranial hemorrhage or infarct.  Chest x-ray shows no pneumonia.  Urine apparently looked foul per nursing however it was still in process.  CBC shows no leukocytosis or anemia.  Metabolic panel shows mild hypocalcemia but otherwise normal kidney function and other electrolytes.  Ammonia not elevated.  TSH barely elevated.  Lactic acid is initially elevated at 2.1 but then rose to 4.0.  We will give some fluid.  I spoke with neurology given the transient altered mental status and difficulty with speech earlier today.  They recommended getting MRI to further evaluate.  If MRI is normal, they recommend discharging back if she remains at her baseline as this sounds like she may have had a seizure causing her altered mental status transiently.  They recommend close neurology follow-up and continuing her outpatient Keppra and Depakote.  If MRI does show significant reality, anticipate calling neurology back.  If urine does show evidence of urinary tract infection, anticipate antibiotics and admission for altered mental status transiently.  Care transferred to oncoming team while waiting for results of urinalysis and MRI.  11:56 PM As I was reassessing the patient, she was more tachycardic than before.  Heart rate was in the 120 range.  We will get a repeat EKG getting the fluids.  She was more somnolent but then was answering questions.  She was staring off at times but would have blink to threat.  Unclear if she is having small seizures or not.  Will load with Keppra while waiting for MRI and further evaluation.   Final Clinical Impression(s) / ED Diagnoses Final diagnoses:  Transient alteration of awareness     Clinical Impression: 1. Transient alteration of awareness     Disposition: Care transferred to oncoming team while waiting for results of urinalysis and MRI.  This note was prepared with assistance of Actor. Occasional wrong-word or  sound-a-like substitutions may have occurred due to the inherent limitations of voice recognition software.     Syann Cupples, Gwenyth Allegra, MD 09/12/19 [phone]

## 2019-09-12 NOTE — ED Provider Notes (Signed)
11:52 PM Care assumed from Dr. Sherry Ruffing with altered mental status, questionable seizures (history of seizures). MRI brain pending. Can go home if no acute finding and she returns to baseline mental status.  Urinalysis does come back negative.  MRI shows no acute process.  However, patient is now very somnolent and nowhere near her baseline mental status.  Will need to be admitted for observation.  Case is discussed with Dr. Charleen Kirks of Internal Medicine Teaching Service, who agrees to admit the patient.   Delora Fuel, MD 59/09/31 [phone]

## 2019-09-13 ENCOUNTER — Observation Stay (HOSPITAL_COMMUNITY): Payer: Medicare (Managed Care)

## 2019-09-13 DIAGNOSIS — R569 Unspecified convulsions: Secondary | ICD-10-CM

## 2019-09-13 DIAGNOSIS — R404 Transient alteration of awareness: Secondary | ICD-10-CM

## 2019-09-13 LAB — BASIC METABOLIC PANEL
Anion gap: 7 (ref 5–15)
BUN: 9 mg/dL (ref 8–23)
CO2: 29 mmol/L (ref 22–32)
Calcium: 8.6 mg/dL — ABNORMAL LOW (ref 8.9–10.3)
Chloride: 105 mmol/L (ref 98–111)
Creatinine, Ser: 0.7 mg/dL (ref 0.44–1.00)
GFR calc Af Amer: >60 mL/min (ref >60)
GFR calc non Af Amer: >60 mL/min (ref >60)
Glucose, Bld: 97 mg/dL (ref 70–99)
Potassium: 4.1 mmol/L (ref 3.5–5.1)
Sodium: 141 mmol/L (ref 135–145)

## 2019-09-13 LAB — CBC
HCT: 41.9 % (ref 36.0–46.0)
Hemoglobin: 12.8 g/dL (ref 12.0–15.0)
MCH: 26.7 pg (ref 26.0–34.0)
MCHC: 30.5 g/dL (ref 30.0–36.0)
MCV: 87.3 fL (ref 80.0–100.0)
Platelets: 288 10*3/uL (ref 150–400)
RBC: 4.8 MIL/uL (ref 3.87–5.11)
RDW: 15.3 % (ref 11.5–15.5)
WBC: 9.6 10*3/uL (ref 4.0–10.5)
nRBC: 0 % (ref 0.0–0.2)

## 2019-09-13 LAB — LACTIC ACID, PLASMA: Lactic Acid, Venous: 1.7 mmol/L (ref 0.5–1.9)

## 2019-09-13 LAB — SARS CORONAVIRUS 2 BY RT PCR (HOSPITAL ORDER, PERFORMED IN ~~LOC~~ HOSPITAL LAB): SARS Coronavirus 2: NEGATIVE

## 2019-09-13 MED ORDER — LOSARTAN POTASSIUM 50 MG PO TABS
25.0000 mg | ORAL_TABLET | Freq: Every day | ORAL | Status: DC
  Administered 2019-09-13: 25 mg via ORAL
  Filled 2019-09-13: qty 1

## 2019-09-13 MED ORDER — POLYETHYLENE GLYCOL 3350 17 G PO PACK: 17.0000 g | PACK | Freq: Every day | ORAL | Status: DC | PRN

## 2019-09-13 MED ORDER — DIVALPROEX SODIUM 250 MG PO DR TAB
500.0000 mg | DELAYED_RELEASE_TABLET | Freq: Two times a day (BID) | ORAL | Status: DC
  Administered 2019-09-13: 500 mg via ORAL
  Filled 2019-09-13: qty 2

## 2019-09-13 MED ORDER — ADULT MULTIVITAMIN W/MINERALS CH
1.0000 | ORAL_TABLET | Freq: Every day | ORAL | Status: DC
  Administered 2019-09-13: 1 via ORAL
  Filled 2019-09-13: qty 1

## 2019-09-13 MED ORDER — ASPIRIN EC 81 MG PO TBEC: 81.0000 mg | DELAYED_RELEASE_TABLET | Freq: Every day | ORAL | Status: DC

## 2019-09-13 MED ORDER — ACETAMINOPHEN 325 MG PO TABS: 650.0000 mg | ORAL_TABLET | Freq: Four times a day (QID) | ORAL | Status: DC | PRN

## 2019-09-13 MED ORDER — ENOXAPARIN SODIUM 40 MG/0.4ML ~~LOC~~ SOLN
40.0000 mg | SUBCUTANEOUS | Status: DC
  Administered 2019-09-13: 40 mg via SUBCUTANEOUS
  Filled 2019-09-13: qty 0.4

## 2019-09-13 MED ORDER — SODIUM CHLORIDE 0.9% FLUSH: 3.0000 mL | Freq: Two times a day (BID) | INTRAVENOUS | Status: DC

## 2019-09-13 MED ORDER — LORAZEPAM 2 MG/ML IJ SOLN
1.0000 mg | Freq: Once | INTRAMUSCULAR | Status: AC
  Administered 2019-09-13: 1 mg via INTRAVENOUS
  Filled 2019-09-13: qty 1

## 2019-09-13 MED ORDER — DIVALPROEX SODIUM 250 MG PO DR TAB
500.0000 mg | DELAYED_RELEASE_TABLET | Freq: Two times a day (BID) | ORAL | 2 refills | Status: DC
Start: 2019-09-13 — End: 2019-12-19

## 2019-09-13 MED ORDER — LEVETIRACETAM 250 MG PO TABS
250.0000 mg | ORAL_TABLET | Freq: Two times a day (BID) | ORAL | Status: DC
  Administered 2019-09-13: 250 mg via ORAL
  Filled 2019-09-13 (×2): qty 1

## 2019-09-13 MED ORDER — ACETAMINOPHEN 650 MG RE SUPP: 650.0000 mg | Freq: Four times a day (QID) | RECTAL | Status: DC | PRN

## 2019-09-13 NOTE — ED Notes (Signed)
[facility] to pickup patient between 1700-1730.

## 2019-09-13 NOTE — ED Notes (Addendum)
MRI contacted this nurse regarding pt being combative and irritable with staff for pending MRI scan, Roxanne Mins MD made aware, pending medication order.

## 2019-09-13 NOTE — ED Notes (Signed)
To MRI

## 2019-09-13 NOTE — ED Notes (Signed)
Pt returned from MRI in stable condition pt resting comfortable with no acute distress noted at this time.

## 2019-09-13 NOTE — Discharge Instructions (Signed)
To Ms. Fulbright,  It was a pleasure working with you during your hospital stay. During your stay you were worked for altered mental status. Your tests were negative for any causes of altered mental status, and your mentation improved. Please return if you experience high fevers, acute decline in mental status, or acute decline in your normal functioning.

## 2019-09-13 NOTE — Progress Notes (Signed)
   Subjective:  ON Events: Admitted   Nicole Blackwell was seen and evaluated at bedside this AM. She was able to answer some questions, minimally following commands.   Objective:  Vital signs in last 24 hours: Vitals:   09/13/19 0353 09/13/19 0400 09/13/19 0530 09/13/19 0600  BP: 136/76 111/62 128/72 121/65  Pulse: 88 87 87 86  Resp: 11 12 11 10   Temp:      TempSrc:      SpO2: 99% 98% 98% 99%  Weight:      Height:       Physical Exam Constitutional:      Appearance: She is not diaphoretic.     Comments: Somnolent, gives one word answers after repeated stimuli. Able to follow basic commands. Moving spontaneously.   Cardiovascular:     Rate and Rhythm: Normal rate and regular rhythm.  Pulmonary:     Effort: Pulmonary effort is normal.     Breath sounds: Normal breath sounds.  Abdominal:     General: Abdomen is flat. Bowel sounds are normal.     Tenderness: There is no abdominal tenderness.  Musculoskeletal:        General: No swelling.     Right lower leg: No edema.     Left lower leg: No edema.     Comments: Moving limbs spontaneously. Not able to follow commands, but was able to lightly grip provider's hand when asked for grip strength.      Assessment/Plan:  Active Problems:   Acute encephalopathy  Nicole Blackwell is an 84 y/o F with a PMH of alzheimer's dementia, prior hospitalizations for encephalopathy, stroke, seizures, and CHF presented to the ED for transient AMS episode. Admitted after increased somnolence after given Ativan and Keppra load.   Acute Encephalopathy:  Spoke with [facility] about patient's health yesterday. She had a vagal event after being disimpacted with an episode of vomiting. She was back to her baseline at [facility] before returning home. During her ED course she was able to participate in conversations, and was answering appropriately. She was loaded with Keppra out of worry of Absence seizures, and given 1 mg Ativan before undergoing imaging. After MR Brain, she  became somnolent, IMTS was consulted for admission. She is somnolent on examination today, likely 2/2 to Keppra and Ativan. Pending negative results of EEG, and patient becoming more alert, may DC today.  - FU EEG  - Close FU with Neurology in the outpatient setting.  - Continue Keppra and Depakote   Hypertension:  - Losartan  Lactic Acidosis: RESOLVED  Transient Tachycardia: RESOLVED - Improved with 1L of fluid  Vomiting and Diarrhea: RESOLVED - Single occurrence at [facility] with vaso-vagal event. Has not reoccurred.   Prior to Admission Living Arrangement: Home Anticipated Discharge Location: Home  Barriers to Discharge: Continued Medical Workup Dispo: Anticipated discharge in approximately Possible today pending EEG results day(s).   Maudie Mercury, MD 09/13/2019, 6:57 AM Pager: [phone] After 5pm on weekdays and 1pm on weekends: On Call pager [phone]

## 2019-09-13 NOTE — Procedures (Signed)
Patient Name: Nicole Blackwell  MRN: [id number]  Epilepsy Attending: Lora Havens  Referring Physician/Provider: Dr Revonda Humphrey Date: 09/13/2019 Duration: 25.17 mins  Patient history: 84 y/o F with a PMH of alzheimer's dementia, prior hospitalizations for encephalopathy, stroke, seizures, and CHF presented to the ED for transient AMS episode. Admitted after increased somnolence after given Ativan and Keppra load. EEG to evaluate for seizure.   Level of alertness: Awake/lethargic, asleep  AEDs during EEG study: LEV, VPA  Technical aspects: This EEG study was done with scalp electrodes positioned according to the 10-20 International system of electrode placement. Electrical activity was acquired at a sampling rate of 500Hz  and reviewed with a high frequency filter of 70Hz  and a low frequency filter of 1Hz . EEG data were recorded continuously and digitally stored.   Description: No posterior dominant rhythm. Sleep was characterized by vertex waves, sleep spindles (12 to 14 Hz), maximal frontocentral region. EEG showed continuous generalized and maximal right temporal region polymorphic 7-9Hz  theta-alpha activity as well as intermittent 2-3hz  delta slowing was noted. Hyperventilation and photic stimulation were not performed.     ABNORMALITY -Continuous slow, generalized and maximal right temporal region  IMPRESSION: This study is suggestive of non specific cortical dysfunction in right temporal region as well as moderate diffuse encephalopathy, nonspecific etiology. No seizures or epileptiform discharges were seen throughout the recording.  Carry Ortez Barbra Sarks

## 2019-09-13 NOTE — ED Notes (Signed)
Lunch Tray Ordered @ 1023. 

## 2019-09-13 NOTE — ED Notes (Signed)
Breakfast ordered--[name] 

## 2019-09-13 NOTE — ED Notes (Signed)
Please call daughter @ [phone] for a status update--Nicole Blackwell

## 2019-09-13 NOTE — ED Notes (Signed)
[facility].

## 2019-09-13 NOTE — Progress Notes (Signed)
STAT EEG complete - results pending. ? ?

## 2019-09-13 NOTE — Progress Notes (Deleted)
Name: Nicole Blackwell MRN: [id number] DOB: 1935-12-09 84 y.o. PCP: Patient, No Pcp Per  Date of Admission: 09/12/2019  7:13 PM Date of Discharge: 09/13/2019 Attending Physician: Sid Falcon, MD  Discharge Diagnosis: 1. Acute Intermittent Encephalopathy Related to Dementia  Discharge Medications: Allergies as of 09/13/2019      Reactions   Penicillin G Benzathine Swelling   Did it involve swelling of the face/tongue/throat, SOB, or low BP? Unknown Did it involve sudden or severe rash/hives, skin peeling, or any reaction on the inside of your mouth or nose? Unknown Did you need to seek medical attention at a hospital or doctor's office? Unknown When did it last happen?unknown If all above answers are "NO", may proceed with cephalosporin use.   Penicillins Swelling   Did it involve swelling of the face/tongue/throat, SOB, or low BP? Unknown Did it involve sudden or severe rash/hives, skin peeling, or any reaction on the inside of your mouth or nose? Unknown Did you need to seek medical attention at a hospital or doctor's office? Unknown When did it last happen?unknown If all above answers are "NO", may proceed with cephalosporin use.      Medication List    STOP taking these medications   cefpodoxime 100 MG tablet Commonly known as: VANTIN     TAKE these medications   aspirin EC 81 MG tablet Take 81 mg by mouth at bedtime.   divalproex 250 MG DR tablet Commonly known as: Depakote Take 2 tablets (500 mg total) by mouth 2 (two) times daily. What changed:   medication strength  Another medication with the same name was removed. Continue taking this medication, and follow the directions you see here.   levETIRAcetam 250 MG tablet Commonly known as: Keppra Take 1 tablet (250 mg total) by mouth 2 (two) times daily.   losartan 25 MG tablet Commonly known as: COZAAR TAKE 1 TABLET(25 MG) BY MOUTH DAILY What changed: See the new instructions.   vitamin B-12  100 MCG tablet Commonly known as: CYANOCOBALAMIN Take 100 mcg by mouth daily.   Vitamin D (Ergocalciferol) 1.25 MG (50000 UNIT) Caps capsule Commonly known as: DRISDOL Take 50,000 Units by mouth every 7 (seven) days.       Disposition and follow-up:   [name] was discharged from [facility] in Stable condition.  At the hospital follow up visit please address:  1.  Acute Intermittent Encephalopathy Related to Dementia:  - Continue follow up to ensure still at baseline at followup  - Per Daughter patient finished Keppra and Depakote dosing and finished the medical course, with history of seizure she was started back on Keppra 250 mg BID, and Depakote 500 mg BID. Consider medication optimization and reinforce adherence to seizure medications at next office visit.  2.  Labs / imaging needed at time of follow-up: None  3.  Pending labs/ test needing follow-up: None   Follow-up Appointments:  Follow-up Information    [facility]. Schedule an appointment as soon as possible for a visit.   Why: Please call for an appointment with [facility] for follow up              Hospital Course by problem list: 1. Acute Intermittent Encephalopathy Related to Dementia Patient presented to the ED from home after a visit from [facility]. During her [facility] visit, she had an episode of nausea and vomiting, with diarrhea after an disimpaction. She was at baseline at [facility] and returned home. Per patient's daughter, her  mother was speaking less and not acting at baseline. She was brought to the ED and was answering questions appropriately. ED did load her with a Keppra bolus for absence seizure. She also received 1 mg of Ativan before undergoing her MR Brain. After her MR Brain, she was found to be somnolent, likely secondarily to her Keppra and Ativan. She was observed and found to have improved mentation. She was discharged in stable condition.   Discharge Vitals:   BP 126/62   Pulse 63   Temp 98.8  F (37.1 C) (Oral)   Resp 13   Ht 5\' 4"  (1.626 m)   Wt 66 kg   SpO2 99%   BMI 24.98 kg/m   Pertinent Labs, Studies, and Procedures:   Ref Range & Units 04:44 1 d ago 1 d ago 6 mo ago  Lactic Acid, Venous 0.5 - 1.9 mmol/L 1.7  4.0High Panic CM  2.1High Panic VC, CM    CBC Latest Ref Rng & Units 09/13/2019 09/12/2019 04/18/2019  WBC 4.0 - 10.5 K/uL 9.6 9.9 11.2(H)  Hemoglobin 12.0 - 15.0 g/dL 12.8 12.5 14.0  Hematocrit 36 - 46 % 41.9 42.1 46.6(H)  Platelets 150 - 400 K/uL 288 263 250    BMP Latest Ref Rng & Units 09/13/2019 09/12/2019 04/18/2019  Glucose 70 - 99 mg/dL 97 126(H) 95  BUN 8 - 23 mg/dL 9 13 7(L)  Creatinine 0.44 - 1.00 mg/dL 0.70 0.79 0.68  Sodium 135 - 145 mmol/L 141 141 133(L)  Potassium 3.5 - 5.1 mmol/L 4.1 4.6 5.3(H)  Chloride 98 - 111 mmol/L 105 105 103  CO2 22 - 32 mmol/L 29 28 21(L)  Calcium 8.9 - 10.3 mg/dL 8.6(L) 8.3(L) 7.3(L)   MR BRAIN WO CONTRAST:  FINDINGS: Brain: Diffuse prominence of the CSF containing spaces compatible with advanced cerebral atrophy. Changes most pronounced at the temporal lobes bilaterally. Extensive patchy and confluent T2/FLAIR hyperintensity within the periventricular deep white matter both cerebral hemispheres consistent with moderate to advanced chronic microvascular ischemic disease.  No abnormal foci of restricted diffusion to suggest acute or subacute ischemia. Gray-white matter differentiation maintained. No evidence for acute intracranial hemorrhage. Single punctate chronic microhemorrhage noted within the right occipital lobe, of doubtful significance in isolation.  No mass lesion, midline shift or mass effect. Diffuse ventricular prominence related to global parenchymal volume loss without hydrocephalus. No extra-axial fluid collection. Pituitary gland suprasellar region normal. Midline structures intact.  Vascular: Major intracranial vascular flow voids are maintained.  Skull and upper cervical spine:  Craniocervical junction within normal limits. Multilevel degenerative spondylosis noted within the upper cervical spine without high-grade stenosis. Bone marrow signal intensity normal. No scalp soft tissue abnormality.  Sinuses/Orbits: Patient status post bilateral ocular lens replacement. Globes and orbital soft tissues demonstrate no acute finding. Paranasal sinuses are clear. No mastoid effusion. Inner ear structures grossly normal.  Other: None.  IMPRESSION: 1. No acute intracranial abnormality. 2. Advanced cerebral atrophy with chronic microvascular ischemic Disease.  EEG:  Level of alertness: Awake/lethargic, asleep  AEDs during EEG study: LEV, VPA  Technical aspects: This EEG study was done with scalp electrodes positioned according to the 10-20 International system of electrode placement. Electrical activity was acquired at a sampling rate of 500Hz  and reviewed with a high frequency filter of 70Hz  and a low frequency filter of 1Hz . EEG data were recorded continuously and digitally stored.   Description: No posterior dominant rhythm. Sleep was characterized by vertex waves, sleep spindles (12 to 14 Hz), maximal frontocentral  region. EEG showed continuous generalized and maximal right temporal region polymorphic 7-9Hz  theta-alpha activity as well as intermittent 2-3hz  delta slowing was noted. Hyperventilation and photic stimulation were not performed.     ABNORMALITY -Continuous slow, generalized and maximal right temporal region  IMPRESSION: This study is suggestive of non specific cortical dysfunction in right temporal region as well as moderate diffuse encephalopathy, nonspecific etiology. No seizures or epileptiform discharges were seen throughout the recording.     Discharge Instructions: Discharge Instructions    Diet - low sodium heart healthy   Complete by: As directed    Increase activity slowly   Complete by: As directed       Signed: Maudie Mercury, MD 09/13/2019, 2:15 PM   Pager: [phone]

## 2019-09-13 NOTE — Progress Notes (Signed)
°  Date: 09/13/2019  Patient name: MERYEM HAERTEL  Medical record number: [id number]  Date of birth: May 17, 1935        I have seen and evaluated this patient and I have discussed the plan of care with the house staff. Please see Dr. Gilford Rile' note for complete details. I concur with his findings and plan.    Sid Falcon, MD 09/13/2019, 4:37 PM

## 2019-09-13 NOTE — ED Notes (Signed)
Pt resting in bed appearing comfortable with no acute distress noted. Pt GCS 5 due to medication administered for MRI. Hospitalist at bedside with this RN, pt only noted reaction to verbal or tactile stimulation is flexion to pain. Pt opened eyes during COVID swab but remained non verbal. Prior to MRI pt alert and responsive to verbal and tactile stimuli. Provider made aware of this, continuous cardiac monitoring in place at this time.

## 2019-09-13 NOTE — H&P (Addendum)
Date: 09/13/2019               Patient Name:  Nicole Blackwell MRN: [id number]  DOB: 30-Sep-1935 Age / Sex: 84 y.o., female   PCP: Patient, No Pcp Per         Medical Service: Internal Medicine Teaching Service         Attending Physician: Dr. Sid Falcon, MD    First Contact: Dr. Carroll Sage Pager: [phone]  Second Contact: Dr. Nathanial Rancher Pager: [phone]       After Hours (After 5p/  First Contact Pager: [phone]  weekends / holidays): Second Contact Pager: [phone]   Chief Complaint: Altered mental status  History of Present Illness: Nicole Blackwell is an 84 year old female with past medical history significant for alzheimer's dementia, prior hospitalizations for encephalopathy, stroke, seizures, CHF, and HTN who presented to [facility] following a transient episode of altered mental status.  Per EMS, patient was at her baseline this morning prior to going to [facility]. While at [facility], she had an episode of diarrhea and vomiting. When she later returned home, patient's daughter reported that she was not not talking as much and was acting confused, however this behavior is reportedly typical for her. She was sent to [facility] for further evaluation. On arrival to the ED, she was answering all questions appropriately and was oriented to name and location. She had no complaints of headache, neck pain, neck stiffness, vision changes, nausea, vomiting, diarrhea, constipation, or urinary symptoms. Upon evaluation by IMTS, patient was somnolent and unresponsive to questions. We were unable to obtain further history from the patient.  ED Course: Patient arrived to [facility] hemodynamically stable and afebrile. CBC, CMP, UA, lipase and ammonia were unremarkable. TSH mildly elevated to 4.560. Lactic acid elevated to 2.1 -> 4.0. Urine culture was obtained. CXR and CT head were unremarkable. She subsequently developed tachycardia to 120s, received 1L bolus and her tachycardia resolved. She was given 1mg  ativan and reloaded  with keppra and taken for MRI brain with plan to discharge if no acute findings. Although her MRI came back unremarkable, she developed increased somnolence following medications. She was admitted to IMTS for observation.  Meds:  No current facility-administered medications on file prior to encounter.   Current Outpatient Medications on File Prior to Encounter  Medication Sig Dispense Refill  . aspirin EC 81 MG tablet Take 81 mg by mouth at bedtime.    . cefpodoxime (VANTIN) 100 MG tablet Take 1 tablet (100 mg total) by mouth 2 (two) times daily. 14 tablet 0  . divalproex (DEPAKOTE) 500 MG DR tablet Take 1 tablet (500 mg total) by mouth 2 (two) times daily. (Patient taking differently: Take 250 mg by mouth 2 (two) times daily. ) 20 tablet 0  . levETIRAcetam (KEPPRA) 250 MG tablet Take 1 tablet (250 mg total) by mouth 2 (two) times daily. (Patient not taking: Reported on 04/18/2019) 60 tablet 0  . losartan (COZAAR) 25 MG tablet TAKE 1 TABLET(25 MG) BY MOUTH DAILY (Patient taking differently: Take 25 mg by mouth daily. ) 30 tablet 0  . vitamin B-12 (CYANOCOBALAMIN) 100 MCG tablet Take 100 mcg by mouth daily.    . Vitamin D, Ergocalciferol, (DRISDOL) 1.25 MG (50000 UNIT) CAPS capsule Take 50,000 Units by mouth every 7 (seven) days.     Allergies: Allergies as of 09/12/2019 - Review Complete 09/12/2019  Allergen Reaction Noted  . Penicillin g benzathine Swelling   . Penicillins Swelling 01/09/2012  Past Medical History:  Diagnosis Date  . BACK PAIN   . Hypertension   . OSTEOPOROSIS   . PLANTAR FASCIITIS   . Positive PPD   . Seizures ([place])   . SHINGLES   . SYMPTOMATIC MENOPAUSAL/FEMALE CLIMACTERIC STATES   . Syncope and collapse 12/13/2014  . Uterine cancer ([place])    Family History:  Family History  Problem Relation Age of Onset  . Healthy Mother   . Healthy Father    Social History:  Per chart review, patient reportedly has never smoked or used smokeless tobacco. She currently  does not drink alcohol or use recreational drugs.  Review of Systems: A complete ROS was negative except as per HPI.  Physical Exam: Blood pressure 111/62, pulse 87, temperature 98.8 F (37.1 C), temperature source Oral, resp. rate 12, height 5\' 4"  (1.626 m), weight 66 kg, SpO2 98 %. Physical Exam Constitutional:      Appearance: She is normal weight.  HENT:     Head: Normocephalic and atraumatic.     Mouth/Throat:     Mouth: Mucous membranes are moist.     Pharynx: Oropharynx is clear.     Comments: No tongue lacerations Eyes:     Conjunctiva/sclera: Conjunctivae normal.     Pupils: Pupils are equal, round, and reactive to light.  Cardiovascular:     Rate and Rhythm: Normal rate and regular rhythm.     Pulses: Normal pulses.     Heart sounds: Normal heart sounds.  Pulmonary:     Effort: Pulmonary effort is normal.     Breath sounds: Normal breath sounds.  Abdominal:     General: Abdomen is flat. Bowel sounds are normal.     Palpations: Abdomen is soft.     Tenderness: There is no abdominal tenderness.  Musculoskeletal:     Cervical back: Normal range of motion and neck supple.  Skin:    General: Skin is warm and dry.     Capillary Refill: Capillary refill takes less than 2 seconds.     Findings: No rash.  Neurological:     Comments: Patient is very somnolent and unresponsive to questions. She withdraws to physical stimuli    EKG: Sinus rhythm; Left bundle branch block  CXR: No active disease. Borderline cardiomegaly.  CT head wo contrast: Advanced senescent changes. No evidence of acute intracranial hemorrhage or infarct.  MR brain wo contrast: No acute intracranial abnormality. Advanced cerebral atrophy with chronic microvascular ischemic disease.  Assessment & Plan by Problem: Active Problems:   Acute encephalopathy  Nicole Blackwell is an 84 year old female with past medical history significant for alzheimer's dementia, prior hospitalizations for encephalopathy,  stroke, seizures, CHF, and HTN who presented to [facility] following a transient episode of altered mental status found to be acutely encephalopathic, now with increased somnolence.  #Acute encephalopathy, active #History of prior admissions for encephalopathy Etiology of patient's initial transient altered mental status remains unclear. Given her history of seizures, she may have had a seizure that contributed to her mental state upon return from [facility]. However, while in the ED, no seizure like activity was observed.  She has a history of multiple prior admissions and ED visits for similar activity, and imaging of her brain reveals advanced cerebral atrophy with chronic microvascular ischemic disease. While in the ED, she was reloaded with Keppra and received 1mg  ativan prior to MRI of brain. Following chemical sedation, patient is now very somnolent and unresponsive to further questioning. However, she remains hemodynamically stable with  unremarkable imaging and stable electrolytes. Additionally, she has not had any recent changes to her medication regimen, and exhibits no hepatic dysfunction or renal insufficiency. -Neuro evaluated, appreciate recs   -Close neurology follow-up in outpatient setting   -Continue outpatient Keppra   -Continue outpatient Depakote -EEG  #Lactic acidosis, active Initial lactic acid of 2.1 followed by repeat lactic acid of 4.0. Etiology unknown at this point. -Repeat lactic acid  #Transient tachycardia, resolved Patient developed brief period of tachycardia to 120s which resolved upon bolus of fluids. She otherwise remained hemodynamically stable throughout this time. -Cardiac monitoring  #Vomiting and Diarrhea, resolved Patient had reported episode of vomiting and diarrhea at [facility], however she has not had any recurrent episodes. She is incontinent at baseline. She is afebrile with no leukocytosis or infectious symptoms. -Continue to monitor  #HTN, chronic -Continue  home losartan  Code status: Full IVF: none Diet: Heart healthy VTE ppx: Lovenox 40mg   Dispo: Admit patient to Inpatient with expected length of stay greater than 2 midnights.  Signed: Paulla Dolly, MD 09/13/2019, 4:53 AM  Pager: [phone] After 5pm on weekdays and 1pm on weekends: On Call pager: [phone]

## 2019-09-13 NOTE — ED Notes (Signed)
Per assessment of pt behavior in MRI, pt alert pt waving hands around on table and speaking with staff. Pt stated "that hurt" when IV Ativan pushed. Pt cooperative at this time, pt remained in MRI in stable condition.

## 2019-09-14 LAB — URINE CULTURE: Culture: >=100000 — AB

## 2019-09-14 NOTE — Discharge Summary (Signed)
Name: Nicole Blackwell MRN: [id number] DOB: Mar 01, 1935 84 y.o. PCP: Patient, No Pcp Per  Date of Admission: 09/12/2019  7:13 PM Date of Discharge: 09/13/2019 Attending Physician: Dr. Daryll Drown   Discharge Diagnosis: 1. Acute Intermittent Encephalopathy Related to Dementia  Discharge Medications: Allergies as of 09/13/2019      Reactions   Penicillin G Benzathine Swelling   Did it involve swelling of the face/tongue/throat, SOB, or low BP? Unknown Did it involve sudden or severe rash/hives, skin peeling, or any reaction on the inside of your mouth or nose? Unknown Did you need to seek medical attention at a hospital or doctor's office? Unknown When did it last happen?unknown If all above answers are "NO", may proceed with cephalosporin use.   Penicillins Swelling   Did it involve swelling of the face/tongue/throat, SOB, or low BP? Unknown Did it involve sudden or severe rash/hives, skin peeling, or any reaction on the inside of your mouth or nose? Unknown Did you need to seek medical attention at a hospital or doctor's office? Unknown When did it last happen?unknown If all above answers are "NO", may proceed with cephalosporin use.      Medication List    STOP taking these medications   cefpodoxime 100 MG tablet Commonly known as: VANTIN     TAKE these medications   aspirin EC 81 MG tablet Take 81 mg by mouth at bedtime.   divalproex 250 MG DR tablet Commonly known as: Depakote Take 2 tablets (500 mg total) by mouth 2 (two) times daily. What changed:   medication strength  Another medication with the same name was removed. Continue taking this medication, and follow the directions you see here.   levETIRAcetam 250 MG tablet Commonly known as: Keppra Take 1 tablet (250 mg total) by mouth 2 (two) times daily.   losartan 25 MG tablet Commonly known as: COZAAR TAKE 1 TABLET(25 MG) BY MOUTH DAILY What changed: See the new instructions.   vitamin B-12 100 MCG  tablet Commonly known as: CYANOCOBALAMIN Take 100 mcg by mouth daily.   Vitamin D (Ergocalciferol) 1.25 MG (50000 UNIT) Caps capsule Commonly known as: DRISDOL Take 50,000 Units by mouth every 7 (seven) days.       Disposition and follow-up:   [name] was discharged from [facility] in Stable condition.  At the hospital follow up visit please address:  1.  Acute Intermittent Encephalopathy Related to Dementia:  - Continue follow up to ensure still at baseline at followup  - Per Daughter patient finished Keppra and Depakote dosing and finished the medical course, with history of seizure she was started back on Keppra 250 mg BID, and Depakote 500 mg BID. Consider medication optimization and reinforce adherence to seizure medications at next office visit.  2.  Labs / imaging needed at time of follow-up: None  3.  Pending labs/ test needing follow-up: None   Follow-up Appointments:  Follow-up Information    [facility]. Schedule an appointment as soon as possible for a visit.   Why: Please call for an appointment with [facility] for follow up              Hospital Course by problem list: 1. Acute Intermittent Encephalopathy Related to Dementia Patient presented to the ED from home after a visit from [facility]. During her [facility] visit, she had an episode of nausea and vomiting, with diarrhea after an disimpaction. She was at baseline at [facility] and returned home. Per patient's daughter, her mother  was speaking less and not acting at baseline. She was brought to the ED and was answering questions appropriately. ED did load her with a Keppra bolus for absence seizure. She also received 1 mg of Ativan before undergoing her MR Brain. After her MR Brain, she was found to be somnolent, likely secondarily to her Keppra and Ativan. She was observed and found to have improved mentation. She was discharged in stable condition.   Discharge Vitals:   BP 119/83 (BP Location: Right Arm)    Pulse 76   Temp 97.8 F (36.6 C) (Oral)   Resp 16   Ht 5\' 4"  (1.626 m)   Wt 66 kg   SpO2 100%   BMI 24.98 kg/m   Pertinent Labs, Studies, and Procedures:   Ref Range & Units 04:44 1 d ago 1 d ago 6 mo ago  Lactic Acid, Venous 0.5 - 1.9 mmol/L 1.7  4.0High Panic CM  2.1High Panic VC, CM    CBC Latest Ref Rng & Units 09/13/2019 09/12/2019 04/18/2019  WBC 4.0 - 10.5 K/uL 9.6 9.9 11.2(H)  Hemoglobin 12.0 - 15.0 g/dL 12.8 12.5 14.0  Hematocrit 36 - 46 % 41.9 42.1 46.6(H)  Platelets 150 - 400 K/uL 288 263 250    BMP Latest Ref Rng & Units 09/13/2019 09/12/2019 04/18/2019  Glucose 70 - 99 mg/dL 97 126(H) 95  BUN 8 - 23 mg/dL 9 13 7(L)  Creatinine 0.44 - 1.00 mg/dL 0.70 0.79 0.68  Sodium 135 - 145 mmol/L 141 141 133(L)  Potassium 3.5 - 5.1 mmol/L 4.1 4.6 5.3(H)  Chloride 98 - 111 mmol/L 105 105 103  CO2 22 - 32 mmol/L 29 28 21(L)  Calcium 8.9 - 10.3 mg/dL 8.6(L) 8.3(L) 7.3(L)   MR BRAIN WO CONTRAST:  FINDINGS: Brain: Diffuse prominence of the CSF containing spaces compatible with advanced cerebral atrophy. Changes most pronounced at the temporal lobes bilaterally. Extensive patchy and confluent T2/FLAIR hyperintensity within the periventricular deep white matter both cerebral hemispheres consistent with moderate to advanced chronic microvascular ischemic disease.  No abnormal foci of restricted diffusion to suggest acute or subacute ischemia. Gray-white matter differentiation maintained. No evidence for acute intracranial hemorrhage. Single punctate chronic microhemorrhage noted within the right occipital lobe, of doubtful significance in isolation.  No mass lesion, midline shift or mass effect. Diffuse ventricular prominence related to global parenchymal volume loss without hydrocephalus. No extra-axial fluid collection. Pituitary gland suprasellar region normal. Midline structures intact.  Vascular: Major intracranial vascular flow voids are maintained.  Skull and  upper cervical spine: Craniocervical junction within normal limits. Multilevel degenerative spondylosis noted within the upper cervical spine without high-grade stenosis. Bone marrow signal intensity normal. No scalp soft tissue abnormality.  Sinuses/Orbits: Patient status post bilateral ocular lens replacement. Globes and orbital soft tissues demonstrate no acute finding. Paranasal sinuses are clear. No mastoid effusion. Inner ear structures grossly normal.  Other: None.  IMPRESSION: 1. No acute intracranial abnormality. 2. Advanced cerebral atrophy with chronic microvascular ischemic Disease.  EEG:  Level of alertness: Awake/lethargic, asleep  AEDs during EEG study: LEV, VPA  Technical aspects: This EEG study was done with scalp electrodes positioned according to the 10-20 International system of electrode placement. Electrical activity was acquired at a sampling rate of 500Hz  and reviewed with a high frequency filter of 70Hz  and a low frequency filter of 1Hz . EEG data were recorded continuously and digitally stored.   Description: No posterior dominant rhythm. Sleep was characterized by vertex waves, sleep spindles (12 to 14  Hz), maximal frontocentral region. EEG showed continuous generalized and maximal right temporal region polymorphic 7-9Hz  theta-alpha activity as well as intermittent 2-3hz  delta slowing was noted. Hyperventilation and photic stimulation were not performed.     ABNORMALITY -Continuous slow, generalized and maximal right temporal region  IMPRESSION: This study is suggestive of non specific cortical dysfunction in right temporal region as well as moderate diffuse encephalopathy, nonspecific etiology. No seizures or epileptiform discharges were seen throughout the recording.     Discharge Instructions: Discharge Instructions    Diet - low sodium heart healthy   Complete by: As directed    Increase activity slowly   Complete by: As directed        Signed: Jean Rosenthal, MD 09/14/2019, 8:35 AM   Pager: [phone]

## 2019-09-15 NOTE — Progress Notes (Signed)
ED Antimicrobial Stewardship Positive Culture Follow Up   Nicole Blackwell is an 84 y.o. female who presented to [facility] on 09/12/2019 with a chief complaint of  Chief Complaint  Patient presents with   Altered Mental Status    Recent Results (from the past 720 hour(s))  Urine culture     Status: Abnormal   Collection Time: 09/12/19 11:10 PM   Specimen: Urine, Catheterized  Result Value Ref Range Status   Specimen Description URINE, CATHETERIZED  Final   Special Requests   Final    NONE Performed at [facility], [address] [place], [place] [address]    Culture >=100,000 COLONIES/mL ENTEROCOCCUS FAECALIS (A)  Final   Report Status 09/14/2019 FINAL  Final   Organism ID, Bacteria ENTEROCOCCUS FAECALIS (A)  Final      Susceptibility   Enterococcus faecalis - MIC*    AMPICILLIN <=2 SENSITIVE Sensitive     NITROFURANTOIN <=16 SENSITIVE Sensitive     VANCOMYCIN 1 SENSITIVE Sensitive     * >=100,000 COLONIES/mL ENTEROCOCCUS FAECALIS  SARS Coronavirus 2 by RT PCR (hospital order, performed in [place] hospital lab) Nasopharyngeal Nasopharyngeal Swab     Status: None   Collection Time: 09/13/19  3:27 AM   Specimen: Nasopharyngeal Swab  Result Value Ref Range Status   SARS Coronavirus 2 NEGATIVE NEGATIVE Final    Comment: (NOTE) SARS-CoV-2 target nucleic acids are NOT DETECTED.  The SARS-CoV-2 RNA is generally detectable in upper and lower respiratory specimens during the acute phase of infection. The lowest concentration of SARS-CoV-2 viral copies this assay can detect is 250 copies / mL. A negative result does not preclude SARS-CoV-2 infection and should not be used as the sole basis for treatment or other patient management decisions.  A negative result may occur with improper specimen collection / handling, submission of specimen other than nasopharyngeal swab, presence of viral mutation(s) within the areas targeted by this assay, and inadequate number of viral  copies (<250 copies / mL). A negative result must be combined with clinical observations, patient history, and epidemiological information.  Fact Sheet for Patients:   StrictlyIdeas.no  Fact Sheet for Healthcare Providers: BankingDealers.co.za  This test is not yet approved or  cleared by the [place] FDA and has been authorized for detection and/or diagnosis of SARS-CoV-2 by FDA under an Emergency Use Authorization (EUA).  This EUA will remain in effect (meaning this test can be used) for the duration of the COVID-19 declaration under Section 564(b)(1) of the Act, 21 U.S.C. section 360bbb-3(b)(1), unless the authorization is terminated or revoked sooner.  Performed at [facility], [place] [address] [place], [place] [address]     [x]  Patient discharged originally without antimicrobial agent  New antibiotic prescription: Flow Manager to call for symptom check. If patient remains at baseline, no further treatment indicated. If patient not at baseline, start fosfomycin 3g PO x 1 dose.  ED Provider: Janeece Fitting, PA-C   Margretta Sidle Dohlen 09/15/2019, 8:04 AM Clinical Pharmacist Monday - Friday phone -  [phone] Saturday - Sunday phone - [phone]

## 2019-09-16 ENCOUNTER — Telehealth: Payer: Self-pay | Admitting: Emergency Medicine

## 2019-09-16 NOTE — Telephone Encounter (Deleted)
Post ED Visit - Positive Culture Follow-up: Successful Patient Follow-Up  Culture assessed and recommendations reviewed by:  []  Elenor Quinones, Pharm.D. []  Heide Guile, Pharm.D., BCPS AQ-ID []  Parks Neptune, Pharm.D., BCPS []  Alycia Rossetti, Pharm.D., BCPS []  [place]ath, Pharm.D., BCPS, AAHIVP []  Legrand Como, Pharm.D., BCPS, AAHIVP []  Salome Arnt, PharmD, BCPS []  Johnnette Gourd, PharmD, BCPS []  Hughes Better, PharmD, BCPS [x]  Arturo Morton, PharmD  Positive urine culture  [x]  Patient discharged without antimicrobial prescription and treatment is now indicated []  Organism is resistant to prescribed ED discharge antimicrobial []  Patient with positive blood cultures  Changes discussed with ED provider: Janeece Fitting, PA New antibiotic prescription: Cephalexin 500 mg PO q 12h x five days Called to [place] [phone]. Contacted patient, date 09/16/2019, time [place] 09/16/2019, 5:38 PM

## 2019-09-16 NOTE — Telephone Encounter (Signed)
Post ED Visit - Positive Culture Follow-up: Unsuccessful Patient Follow-up  Culture assessed and recommendations reviewed by:  []  Elenor Quinones, Pharm.D. []  Heide Guile, Pharm.D., BCPS AQ-ID []  Parks Neptune, Pharm.D., BCPS []  Alycia Rossetti, Pharm.D., BCPS []  [place], [place].D., BCPS, AAHIVP []  Legrand Como, Pharm.D., BCPS, AAHIVP [x]  Arturo Morton, PharmD []  Vincenza Hews, PharmD, BCPS  Positive urine culture  [x]  Patient discharged without antimicrobial prescription and treatment is now indicated []  Organism is resistant to prescribed ED discharge antimicrobial []  Patient with positive blood cultures   Unable to contact patient after 3 attempts, letter will be sent to address on file.  Plan: symptom check. If patient @ baseline no further treatment. If not at baselin, start Fosfomycin 3 gram x once - Johana Soto OPA  [place] C Teyanna Thielman 09/16/2019, 5:29 PM

## 2019-10-21 ENCOUNTER — Emergency Department (HOSPITAL_COMMUNITY): Payer: Medicare (Managed Care)

## 2019-10-21 ENCOUNTER — Encounter (HOSPITAL_COMMUNITY): Payer: Self-pay

## 2019-10-21 ENCOUNTER — Emergency Department (HOSPITAL_COMMUNITY)
Admission: EM | Admit: 2019-10-21 | Discharge: 2019-10-21 | Disposition: A | Discharge status: Home / Self Care | Payer: Medicare (Managed Care) | Attending: Emergency Medicine | Admitting: Emergency Medicine

## 2019-10-21 DIAGNOSIS — Y939 Activity, unspecified: Secondary | ICD-10-CM | POA: Insufficient documentation

## 2019-10-21 DIAGNOSIS — Z7982 Long term (current) use of aspirin: Secondary | ICD-10-CM | POA: Diagnosis not present

## 2019-10-21 DIAGNOSIS — Y998 Other external cause status: Secondary | ICD-10-CM | POA: Diagnosis not present

## 2019-10-21 DIAGNOSIS — F0390 Unspecified dementia without behavioral disturbance: Secondary | ICD-10-CM | POA: Diagnosis not present

## 2019-10-21 DIAGNOSIS — I5032 Chronic diastolic (congestive) heart failure: Secondary | ICD-10-CM | POA: Insufficient documentation

## 2019-10-21 DIAGNOSIS — Y929 Unspecified place or not applicable: Secondary | ICD-10-CM | POA: Insufficient documentation

## 2019-10-21 DIAGNOSIS — W06XXXA Fall from bed, initial encounter: Secondary | ICD-10-CM | POA: Diagnosis not present

## 2019-10-21 DIAGNOSIS — I110 Hypertensive heart disease with heart failure: Secondary | ICD-10-CM | POA: Insufficient documentation

## 2019-10-21 DIAGNOSIS — W19XXXA Unspecified fall, initial encounter: Secondary | ICD-10-CM

## 2019-10-21 IMAGING — CT CT CERVICAL SPINE W/O CM
3 series · 12 of 33 positions shown, 14 images · non-contrast
Comparison: MRI brain dated [DATE]. CT head dated [DATE]. CT cervical spine dated [DATE].

CLINICAL DATA: Unwitnessed fall.

EXAM:
CT HEAD WITHOUT CONTRAST
CT CERVICAL SPINE WITHOUT CONTRAST
TECHNIQUE: Multidetector CT imaging of the head and cervical spine was
performed following the standard protocol without intravenous
contrast. Multiplanar CT image reconstructions of the cervical spine
were also generated.

[Series 6: c spine soft · axial · 0.43mm/px · z∈[-176,-46]mm · 4 of 95 slices shown, 5 images]
[im 15/95  soft-tissue]
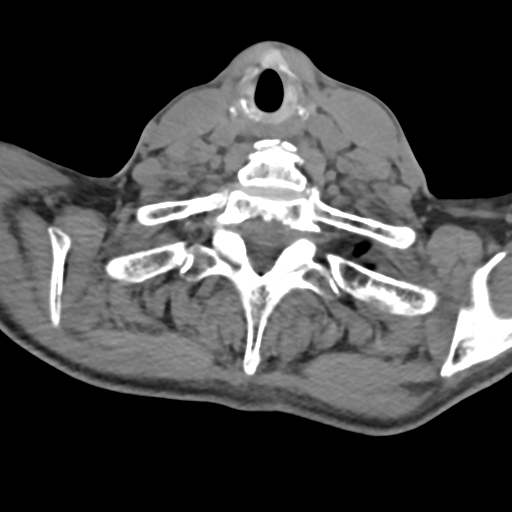
[im 15/95  bone]
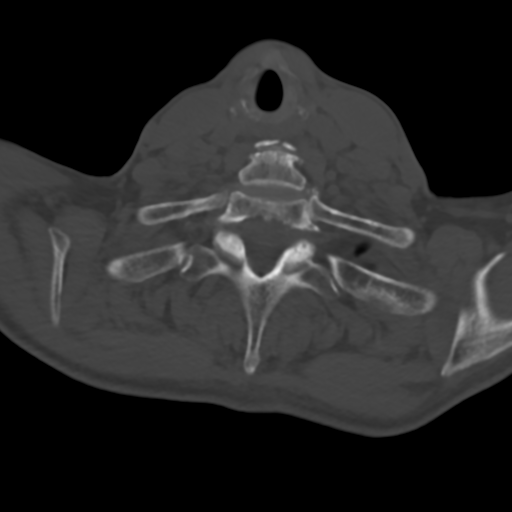
[im 37/95  bone]
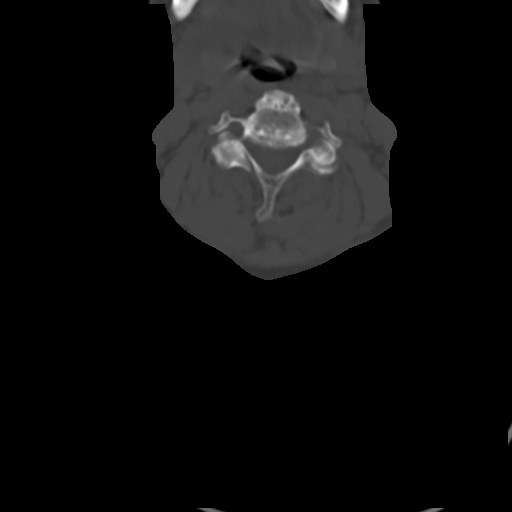
[im 58/95  bone]
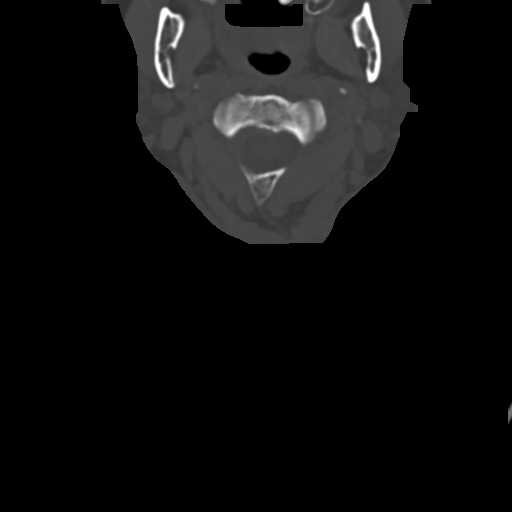
[im 80/95  bone]
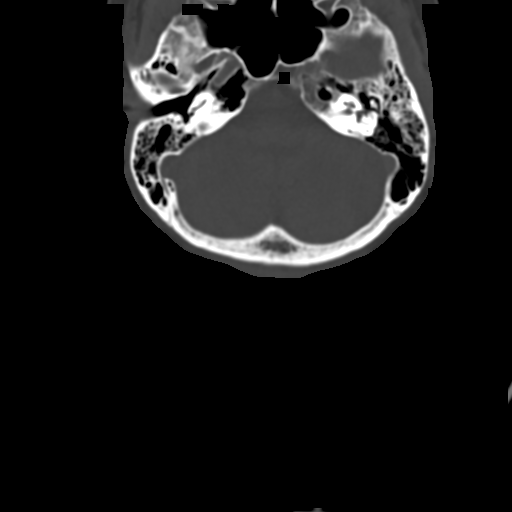

[Series 11: sag bone · sagittal · 0.29mm/px · 5 of 89 slices shown, 6 images]
[im 30/89  bone]
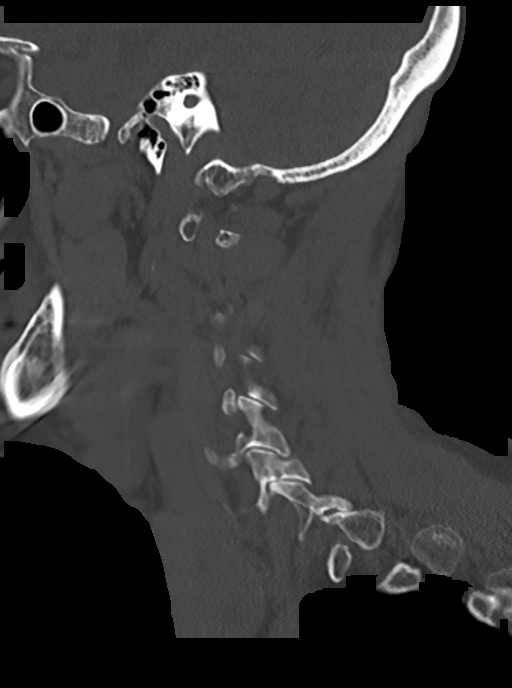
[im 37/89  bone]
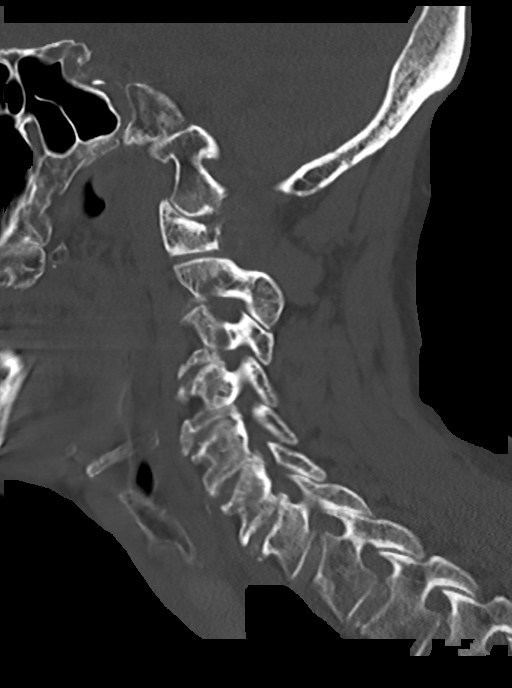
[im 45/89  soft-tissue]
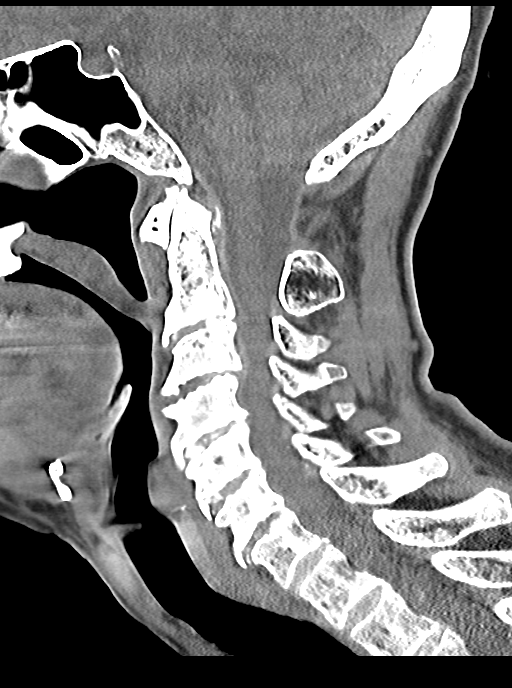
[im 45/89  bone]
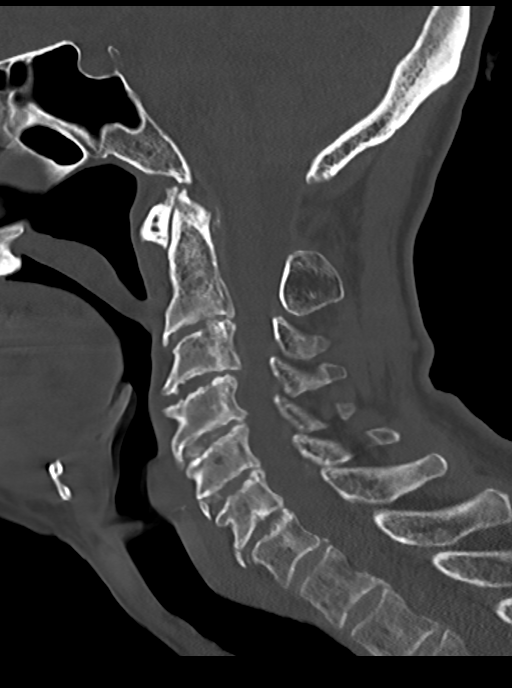
[im 52/89  bone]
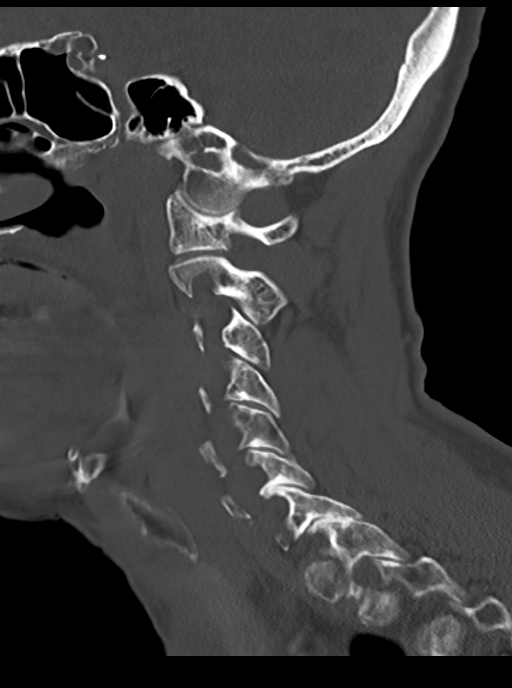
[im 59/89  bone]
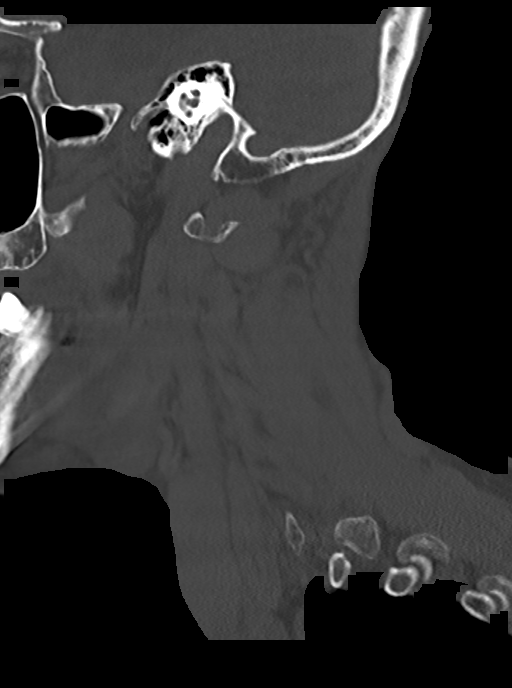

[Series 12: cor bone · coronal · 0.28mm/px · 3 of 52 slices shown]
[im 13/52  bone]
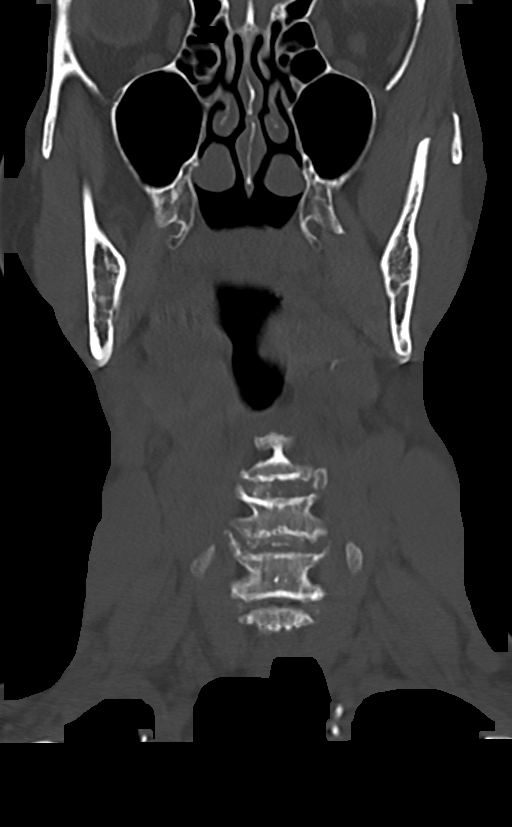
[im 22/52  bone]
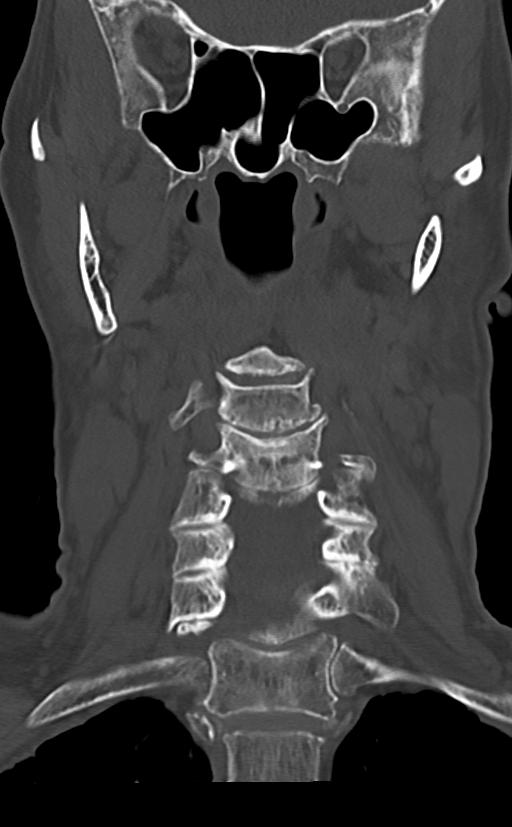
[im 31/52  bone]
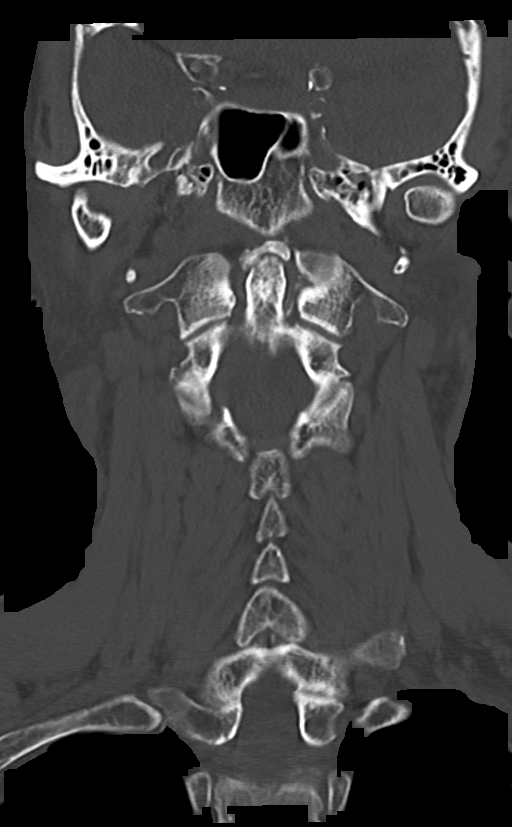

[12 of 33 positions shown; findings below may reference images not displayed]

FINDINGS: CT HEAD FINDINGS

Brain: No evidence of acute infarction, hemorrhage, hydrocephalus,
extra-axial collection or mass lesion/mass effect. Stable atrophy
and chronic microvascular ischemic changes.

Vascular: Atherosclerotic vascular calcification of the carotid
siphons. No hyperdense vessel.

Skull: Normal. Negative for fracture or focal lesion.

Sinuses/Orbits: No acute finding.

Other: None.

CT CERVICAL SPINE FINDINGS

Alignment: No traumatic malalignment.

Skull base and vertebrae: No acute fracture. No primary bone lesion
or focal pathologic process. Absent left C1 posterior arch again
noted.

Soft tissues and spinal canal: No prevertebral fluid or swelling. No
visible canal hematoma.

Disc levels: Moderate multilevel disc space narrowing, similar to
prior study.

Upper chest: Negative.

Other: None.
IMPRESSION: 1. No acute intracranial abnormality. Stable atrophy and chronic
microvascular ischemic changes.
2. No acute cervical spine fracture or traumatic malalignment.
Stable moderate multilevel cervical spondylosis.

## 2019-10-21 IMAGING — CT CT HEAD W/O CM
4 series · 15 of 47 positions shown, 17 images · non-contrast
Comparison: MRI brain dated [DATE]. CT head dated [DATE]. CT cervical spine dated [DATE].

CLINICAL DATA: Unwitnessed fall.

EXAM:
CT HEAD WITHOUT CONTRAST
CT CERVICAL SPINE WITHOUT CONTRAST
TECHNIQUE: Multidetector CT imaging of the head and cervical spine was
performed following the standard protocol without intravenous
contrast. Multiplanar CT image reconstructions of the cervical spine
were also generated.

[Series 3: head wo · axial · 0.47mm/px · z∈[-69,+51]mm · 7 of 32 slices shown, 9 images]
[im 4/32  brain]
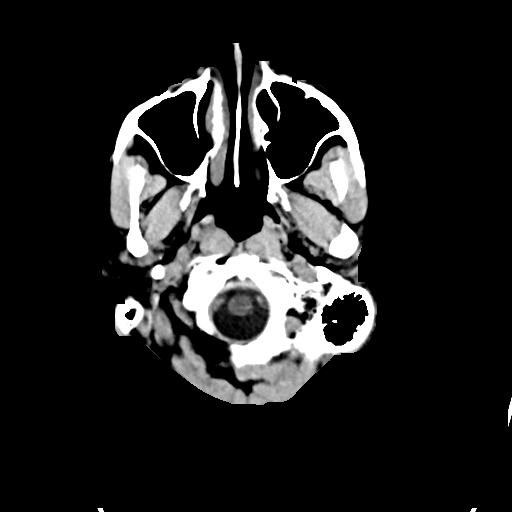
[im 4/32  bone]
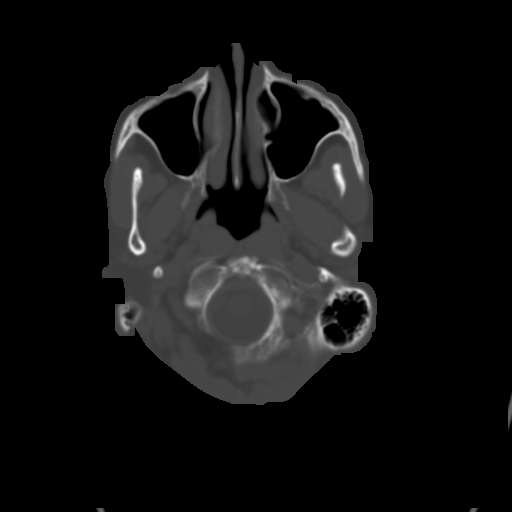
[im 8/32  brain]
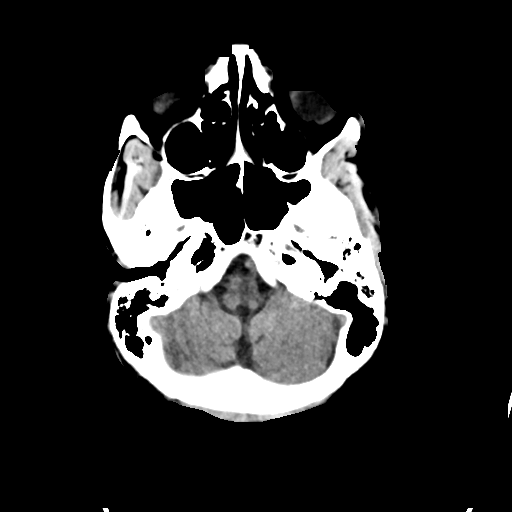
[im 12/32  brain]
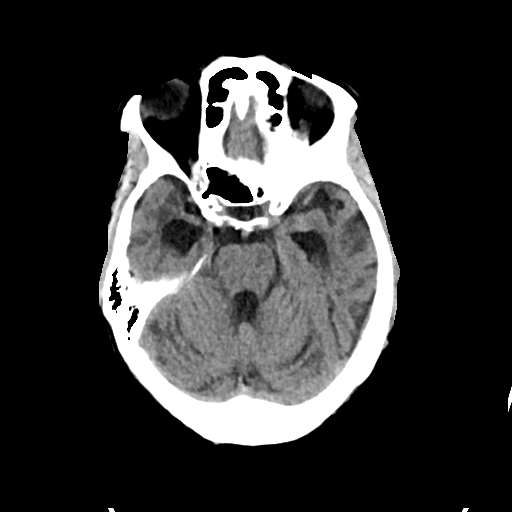
[im 16/32  brain]
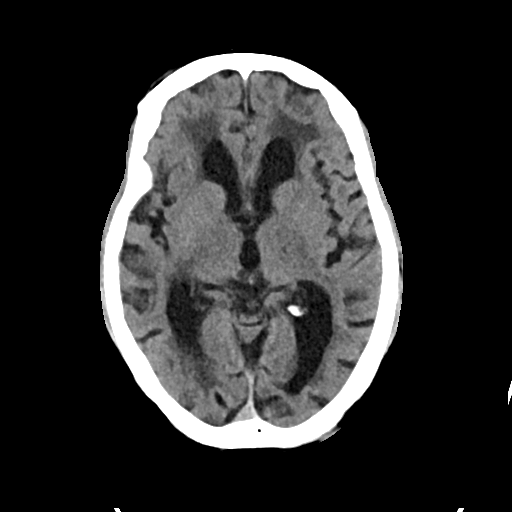
[im 20/32  brain]
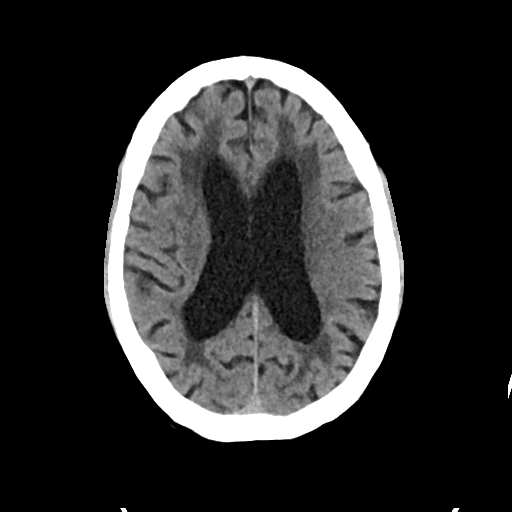
[im 20/32  bone]
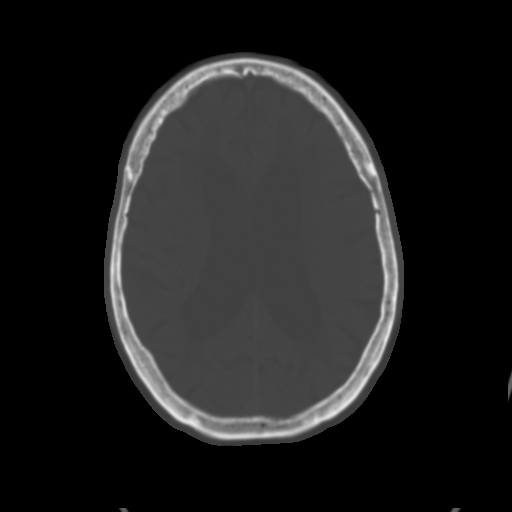
[im 24/32  brain]
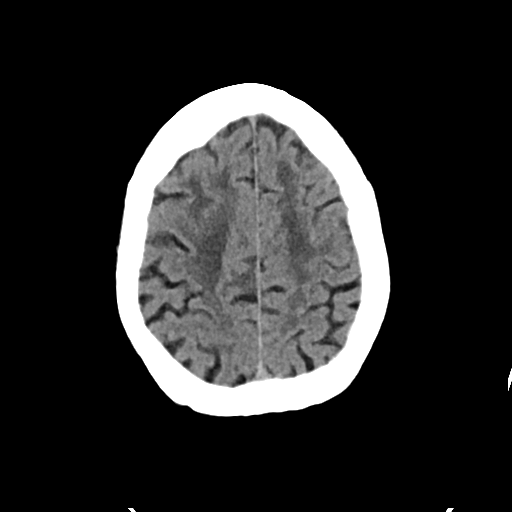
[im 28/32  brain]
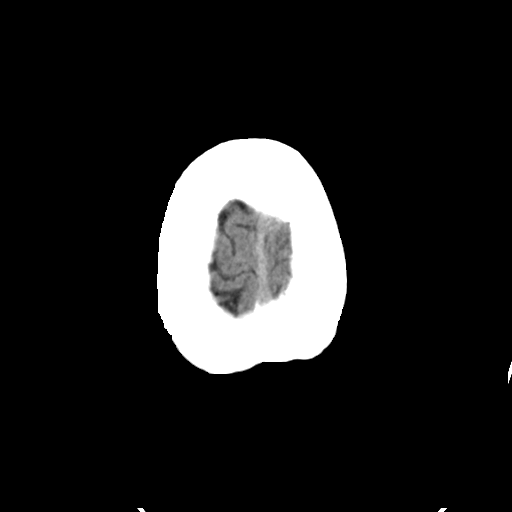

[Series 4: head bone · axial · 0.47mm/px · z∈[-70,-54]mm · 2 of 80 slices shown]
[im 8/80  bone]
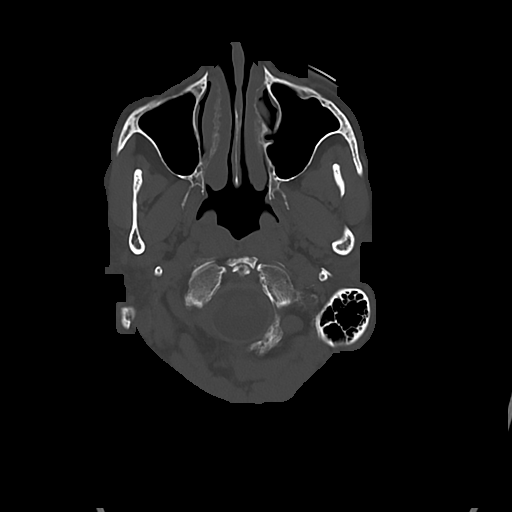
[im 16/80  bone]
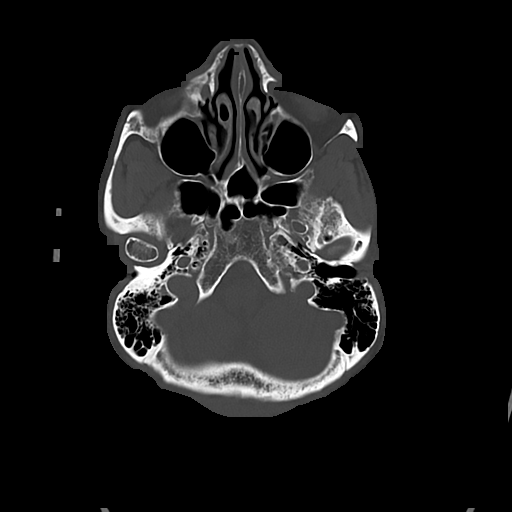

[Series 8: cor soft · coronal · 0.29mm/px · 3 of 70 slices shown]
[im 24/70  brain]
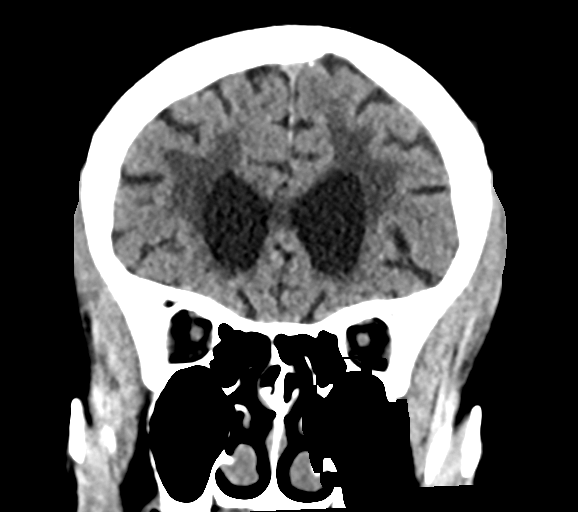
[im 31/70  brain]
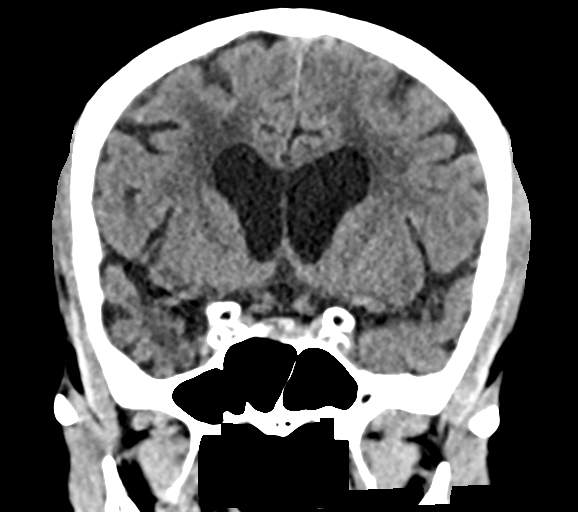
[im 39/70  brain]
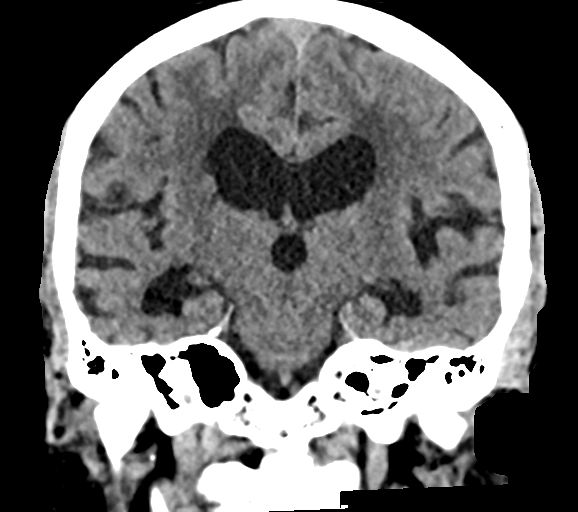

[Series 9: sag soft · sagittal · 0.29mm/px · 3 of 55 slices shown]
[im 19/55  brain]
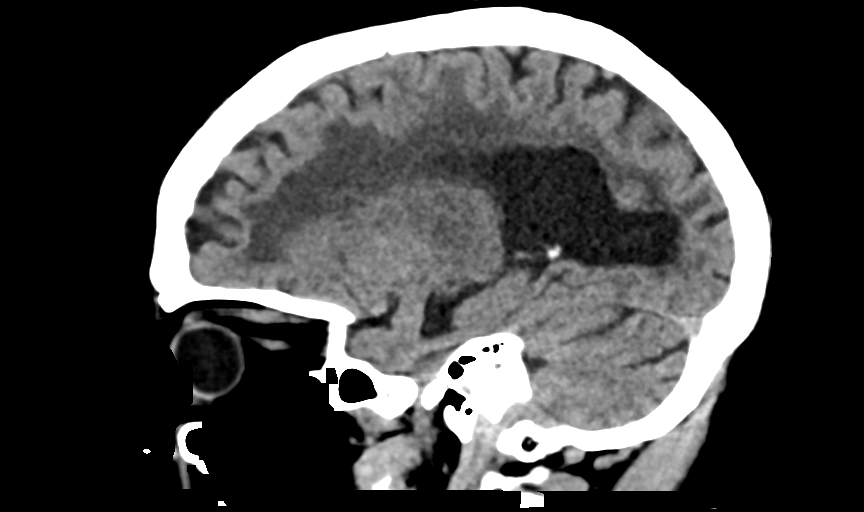
[im 28/55  brain]
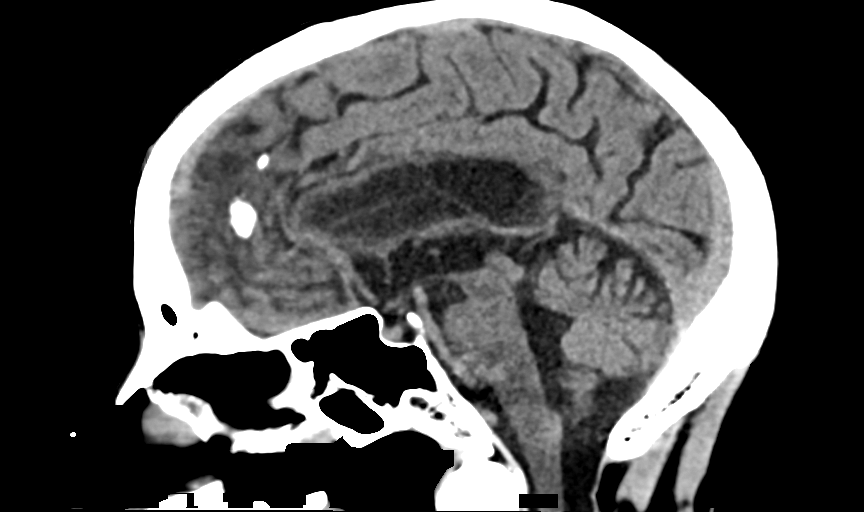
[im 37/55  brain]
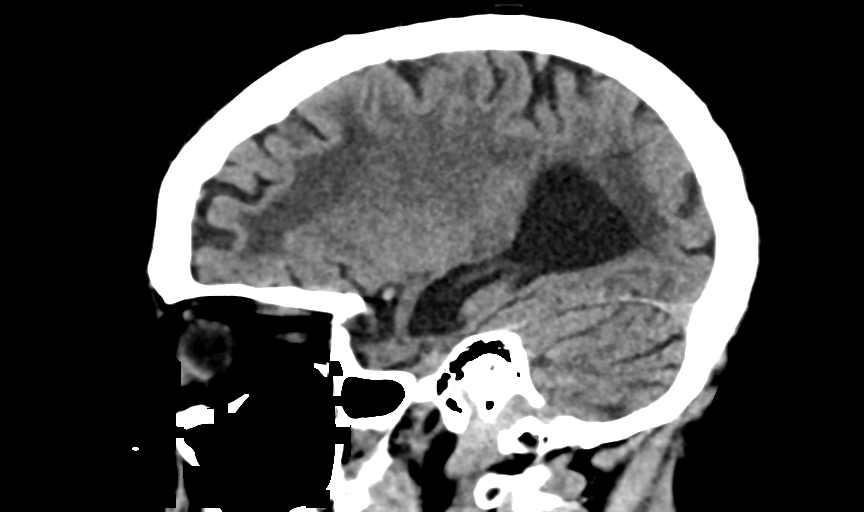

[15 of 47 positions shown; findings below may reference images not displayed]

FINDINGS: CT HEAD FINDINGS

Brain: No evidence of acute infarction, hemorrhage, hydrocephalus,
extra-axial collection or mass lesion/mass effect. Stable atrophy
and chronic microvascular ischemic changes.

Vascular: Atherosclerotic vascular calcification of the carotid
siphons. No hyperdense vessel.

Skull: Normal. Negative for fracture or focal lesion.

Sinuses/Orbits: No acute finding.

Other: None.

CT CERVICAL SPINE FINDINGS

Alignment: No traumatic malalignment.

Skull base and vertebrae: No acute fracture. No primary bone lesion
or focal pathologic process. Absent left C1 posterior arch again
noted.

Soft tissues and spinal canal: No prevertebral fluid or swelling. No
visible canal hematoma.

Disc levels: Moderate multilevel disc space narrowing, similar to
prior study.

Upper chest: Negative.

Other: None.
IMPRESSION: 1. No acute intracranial abnormality. Stable atrophy and chronic
microvascular ischemic changes.
2. No acute cervical spine fracture or traumatic malalignment.
Stable moderate multilevel cervical spondylosis.

## 2019-10-21 NOTE — ED Notes (Signed)
[facility] called to transport pt 

## 2019-10-21 NOTE — ED Notes (Signed)
Nicole Blackwell called from [facility], Dr. Bradd Burner has ordered that pt be taken home by [facility] to daughter Nicole Blackwell [phone]).  RN completed paperwork.

## 2019-10-21 NOTE — ED Provider Notes (Signed)
[facility] Provider Note   CSN: [id number] Arrival date & time: 10/21/19  1536     History Chief Complaint  Patient presents with  . Fall    Nicole Blackwell is a 84 y.o. female with a past medical history of dementia, CHF, who presents today for evaluation after a unwitnessed fall out of bed.  EMS reports that patient's daughter wants her "checked out".  Patient denies any pain.  She states no pain in her arms, legs, chest, abdomen, head, or neck.  When asked she states that she fell out of bed.  Level 5 caveat applies for dementia.  HPI     Past Medical History:  Diagnosis Date  . BACK PAIN   . Hypertension   . OSTEOPOROSIS   . PLANTAR FASCIITIS   . Positive PPD   . Seizures ([place])   . SHINGLES   . SYMPTOMATIC MENOPAUSAL/FEMALE CLIMACTERIC STATES   . Syncope and collapse 12/13/2014  . Uterine cancer [facility])     Patient Active Problem List   Diagnosis Date Noted  . Orthostatic hypotension   . Unsteadiness 02/28/2019  . Suspected cerebrovascular accident (CVA) 02/28/2019  . Acute encephalopathy 02/24/2019  . Lethargy 02/24/2019  . AMS (altered mental status) 12/17/2018  . Fatigue 08/27/2018  . Protein calorie malnutrition ([place]) 05/24/2018  . Hypoalbuminemia 05/23/2018  . Abnormal EEG 05/22/2018  . Dementia ([place]) 05/21/2018  . Fall 05/20/2018  . Rhabdomyolysis 05/20/2018  . Bilateral lower extremity edema 05/20/2018  . Serum total bilirubin elevated 05/20/2018  . B12 deficiency 01/17/2018  . Vascular dementia without behavioral disturbance ([place]) 01/17/2018  . Laceration of scalp without foreign body   . Chronic diastolic CHF (congestive heart failure) ([place]) 12/13/2014  . Syncope 01/12/2012  . BACK PAIN 04/30/2008  . SHINGLES 09/27/2007  . SYMPTOMATIC MENOPAUSAL/FEMALE CLIMACTERIC STATES 06/20/2007  . Osteoporosis 03/11/2007  . PLANTAR FASCIITIS 11/16/2006    Past Surgical History:  Procedure Laterality Date  .  CATARACT EXTRACTION, BILATERAL Bilateral   . DILATION AND CURETTAGE OF UTERUS  1960's X 2   "I lost 2 children to miscarriage"  . LAPAROSCOPIC CHOLECYSTECTOMY    . SIGMOIDOSCOPY    . TONSILLECTOMY    . VAGINAL HYSTERECTOMY       OB History   No obstetric history on file.     Family History  Problem Relation Age of Onset  . Healthy Mother   . Healthy Father     Social History   Tobacco Use  . Smoking status: Never Smoker  . Smokeless tobacco: Never Used  Vaping Use  . Vaping Use: Never used  Substance Use Topics  . Alcohol use: No  . Drug use: No    Home Medications Prior to Admission medications   Medication Sig Start Date End Date Taking? Authorizing Provider  aspirin EC 81 MG tablet Take 81 mg by mouth at bedtime.    [provider]  divalproex (DEPAKOTE) 250 MG DR tablet Take 2 tablets (500 mg total) by mouth 2 (two) times daily. 09/13/19 09/12/20  Maudie Mercury, MD  levETIRAcetam (KEPPRA) 250 MG tablet Take 1 tablet (250 mg total) by mouth 2 (two) times daily. Patient not taking: Reported on 04/18/2019 03/01/19   Al Decant, MD  losartan (COZAAR) 25 MG tablet TAKE 1 TABLET(25 MG) BY MOUTH DAILY Patient taking differently: Take 25 mg by mouth daily.  09/18/18   Billie Ruddy, MD  vitamin B-12 (CYANOCOBALAMIN) 100 MCG tablet Take 100 mcg by  mouth daily.    [provider]  Vitamin D, Ergocalciferol, (DRISDOL) 1.25 MG (50000 UNIT) CAPS capsule Take 50,000 Units by mouth every 7 (seven) days.    [provider]    Allergies    Penicillin g benzathine and Penicillins  Review of Systems   Review of Systems  Unable to perform ROS: Dementia    Physical Exam Updated Vital Signs BP (!) 142/69 (BP Location: Right Arm)   Pulse 64   Temp 99.2 F (37.3 C) (Oral)   Resp 18   Ht 5\' 4"  (1.626 m)   Wt 66 kg   SpO2 100%   BMI 24.98 kg/m   Physical Exam Vitals and nursing note reviewed.  Constitutional:      General: She is not in  acute distress.    Appearance: She is well-developed. She is not diaphoretic.  HENT:     Head: Normocephalic and atraumatic.     Comments: Head wrap was not removed.  Eyes:     General: No scleral icterus.       Right eye: No discharge.        Left eye: No discharge.     Conjunctiva/sclera: Conjunctivae normal.  Cardiovascular:     Rate and Rhythm: Normal rate and regular rhythm.     Pulses: Normal pulses.     Heart sounds: Normal heart sounds. No murmur heard.   Pulmonary:     Effort: Pulmonary effort is normal. No respiratory distress.     Breath sounds: Normal breath sounds. No stridor.  Abdominal:     General: Abdomen is flat. There is no distension.     Palpations: Abdomen is soft.     Tenderness: There is no abdominal tenderness. There is no guarding.  Musculoskeletal:        General: No deformity.     Cervical back: Normal range of motion and neck supple. No rigidity.     Right lower leg: No edema.     Left lower leg: No edema.     Comments: Bilateral arms and legs palpated through range of motion of major joints without crepitus, deformity, or obvious instability.  Compartments are soft and easily compressible without elicited pain through normal range of motion.  Midline C/T/L-spine without tenderness to palpation, palpable step-offs or deformities.  Skin:    General: Skin is warm and dry.  Neurological:     Mental Status: She is alert. Mental status is at baseline.     Motor: No abnormal muscle tone.     Comments: Patient is able to move her arms and legs bilaterally.  5/5 grip strength bilaterally.  Speech is not slurred.  She is awake.  She is oriented to person, not to place or time consistent with her baseline reportedly.  Smile and facial movements are symmetric. Gait not tested as patient is nonambulatory reportedly at baseline.  Psychiatric:        Mood and Affect: Mood normal.        Behavior: Behavior normal.     Comments: Pleasantly demented     ED  Results / Procedures / Treatments   Labs (all labs ordered are listed, but only abnormal results are displayed) Labs Reviewed - No data to display  EKG None  Radiology CT Head Wo Contrast  Result Date: 10/21/2019 CLINICAL DATA:  Unwitnessed fall. EXAM: CT HEAD WITHOUT CONTRAST CT CERVICAL SPINE WITHOUT CONTRAST TECHNIQUE: Multidetector CT imaging of the head and cervical spine was performed following the standard protocol  without intravenous contrast. Multiplanar CT image reconstructions of the cervical spine were also generated. COMPARISON:  MRI brain dated September 23, 2019. CT head dated September 20, 2019. CT cervical spine dated December 20, 2018. FINDINGS: CT HEAD FINDINGS Brain: No evidence of acute infarction, hemorrhage, hydrocephalus, extra-axial collection or mass lesion/mass effect. Stable atrophy and chronic microvascular ischemic changes. Vascular: Atherosclerotic vascular calcification of the carotid siphons. No hyperdense vessel. Skull: Normal. Negative for fracture or focal lesion. Sinuses/Orbits: No acute finding. Other: None. CT CERVICAL SPINE FINDINGS Alignment: No traumatic malalignment. Skull base and vertebrae: No acute fracture. No primary bone lesion or focal pathologic process. Absent left C1 posterior arch again noted. Soft tissues and spinal canal: No prevertebral fluid or swelling. No visible canal hematoma. Disc levels: Moderate multilevel disc space narrowing, similar to prior study. Upper chest: Negative. Other: None. IMPRESSION: 1. No acute intracranial abnormality. Stable atrophy and chronic microvascular ischemic changes. 2. No acute cervical spine fracture or traumatic malalignment. Stable moderate multilevel cervical spondylosis. Electronically Signed   By: Titus Dubin M.D.   On: 10/21/2019 16:49   CT Cervical Spine Wo Contrast  Result Date: 10/21/2019 CLINICAL DATA:  Unwitnessed fall. EXAM: CT HEAD WITHOUT CONTRAST CT CERVICAL SPINE WITHOUT CONTRAST TECHNIQUE:  Multidetector CT imaging of the head and cervical spine was performed following the standard protocol without intravenous contrast. Multiplanar CT image reconstructions of the cervical spine were also generated. COMPARISON:  MRI brain dated September 23, 2019. CT head dated September 20, 2019. CT cervical spine dated December 20, 2018. FINDINGS: CT HEAD FINDINGS Brain: No evidence of acute infarction, hemorrhage, hydrocephalus, extra-axial collection or mass lesion/mass effect. Stable atrophy and chronic microvascular ischemic changes. Vascular: Atherosclerotic vascular calcification of the carotid siphons. No hyperdense vessel. Skull: Normal. Negative for fracture or focal lesion. Sinuses/Orbits: No acute finding. Other: None. CT CERVICAL SPINE FINDINGS Alignment: No traumatic malalignment. Skull base and vertebrae: No acute fracture. No primary bone lesion or focal pathologic process. Absent left C1 posterior arch again noted. Soft tissues and spinal canal: No prevertebral fluid or swelling. No visible canal hematoma. Disc levels: Moderate multilevel disc space narrowing, similar to prior study. Upper chest: Negative. Other: None. IMPRESSION: 1. No acute intracranial abnormality. Stable atrophy and chronic microvascular ischemic changes. 2. No acute cervical spine fracture or traumatic malalignment. Stable moderate multilevel cervical spondylosis. Electronically Signed   By: Titus Dubin M.D.   On: 10/21/2019 16:49    Procedures Procedures (including critical care time)  Medications Ordered in ED Medications - No data to display  ED Course  I have reviewed the triage vital signs and the nursing notes.  Pertinent labs & imaging results that were available during my care of the patient were reviewed by me and considered in my medical decision making (see chart for details).    MDM Rules/Calculators/A&P                         Patient is an 84 year old woman who presents today for evaluation after she  reportedly fell out of bed at home.  EMS reports that her daughter just wants her to be "checked out".  Patient has no complaints or concerns, is pleasantly demented which appears to be her baseline.  No pain or obvious deformities elicited with gentle, normal range of motion of bilateral arms and legs.  She is nonambulatory at baseline per EMS, therefore gait is not tested.    Out of abundance of caution, given this patient's  dementia, CT head and neck were obtained without evidence of fracture, intracranial hemorrhage or other acute abnormalities.  Given that this was reportedly a fall out of bed no indication for further medical evaluation at this time.  I did speak with her daughter who requests transport to bring patient home.   Note: Portions of this report may have been transcribed using voice recognition software. Every effort was made to ensure accuracy; however, inadvertent computerized transcription errors may be present  Final Clinical Impression(s) / ED Diagnoses Final diagnoses:  Fall, initial encounter    Rx / DC Orders ED Discharge Orders    None       Ollen Gross 10/22/19 1535    Valarie Merino, MD 10/24/19 1108

## 2019-10-21 NOTE — Discharge Instructions (Signed)
Today your CT scans of your head and neck were reassuring.  Please follow up with your doctor.

## 2019-10-21 NOTE — ED Triage Notes (Signed)
Pt BIB [facility] for eval of unwitnessed fall out of bed. Pt w/ hx of dementia, no obvious injuries from fall. Daughter wanted her "checked out".

## 2019-11-07 ENCOUNTER — Ambulatory Visit: Payer: Medicare (Managed Care) | Admitting: Diagnostic Neuroimaging

## 2019-11-17 ENCOUNTER — Other Ambulatory Visit: Payer: Self-pay

## 2019-11-17 ENCOUNTER — Inpatient Hospital Stay (HOSPITAL_COMMUNITY): Payer: Medicare (Managed Care)

## 2019-11-17 ENCOUNTER — Encounter (HOSPITAL_COMMUNITY): Payer: Self-pay | Admitting: Emergency Medicine

## 2019-11-17 ENCOUNTER — Emergency Department (HOSPITAL_COMMUNITY): Payer: Medicare (Managed Care)

## 2019-11-17 ENCOUNTER — Inpatient Hospital Stay (HOSPITAL_COMMUNITY)
Admission: EM | Admit: 2019-11-17 | Discharge: 2019-12-19 | DRG: 871 | Disposition: A | Discharge status: Skilled Nursing Facility | Payer: Medicare (Managed Care) | Source: Skilled Nursing Facility | Attending: Internal Medicine | Admitting: Internal Medicine

## 2019-11-17 DIAGNOSIS — M810 Age-related osteoporosis without current pathological fracture: Secondary | ICD-10-CM | POA: Diagnosis present

## 2019-11-17 DIAGNOSIS — Y95 Nosocomial condition: Secondary | ICD-10-CM | POA: Diagnosis present

## 2019-11-17 DIAGNOSIS — E861 Hypovolemia: Secondary | ICD-10-CM | POA: Diagnosis present

## 2019-11-17 DIAGNOSIS — I110 Hypertensive heart disease with heart failure: Secondary | ICD-10-CM | POA: Diagnosis present

## 2019-11-17 DIAGNOSIS — R000 Tachycardia, unspecified: Secondary | ICD-10-CM | POA: Diagnosis not present

## 2019-11-17 DIAGNOSIS — E876 Hypokalemia: Secondary | ICD-10-CM | POA: Diagnosis present

## 2019-11-17 DIAGNOSIS — I739 Peripheral vascular disease, unspecified: Secondary | ICD-10-CM | POA: Diagnosis present

## 2019-11-17 DIAGNOSIS — Z79899 Other long term (current) drug therapy: Secondary | ICD-10-CM

## 2019-11-17 DIAGNOSIS — R64 Cachexia: Secondary | ICD-10-CM | POA: Diagnosis present

## 2019-11-17 DIAGNOSIS — I5033 Acute on chronic diastolic (congestive) heart failure: Secondary | ICD-10-CM | POA: Diagnosis present

## 2019-11-17 DIAGNOSIS — R6520 Severe sepsis without septic shock: Secondary | ICD-10-CM

## 2019-11-17 DIAGNOSIS — R251 Tremor, unspecified: Secondary | ICD-10-CM | POA: Diagnosis not present

## 2019-11-17 DIAGNOSIS — N179 Acute kidney failure, unspecified: Secondary | ICD-10-CM

## 2019-11-17 DIAGNOSIS — Z993 Dependence on wheelchair: Secondary | ICD-10-CM

## 2019-11-17 DIAGNOSIS — G40909 Epilepsy, unspecified, not intractable, without status epilepticus: Secondary | ICD-10-CM | POA: Diagnosis present

## 2019-11-17 DIAGNOSIS — J9601 Acute respiratory failure with hypoxia: Secondary | ICD-10-CM | POA: Diagnosis present

## 2019-11-17 DIAGNOSIS — I5032 Chronic diastolic (congestive) heart failure: Secondary | ICD-10-CM | POA: Diagnosis present

## 2019-11-17 DIAGNOSIS — E559 Vitamin D deficiency, unspecified: Secondary | ICD-10-CM | POA: Diagnosis present

## 2019-11-17 DIAGNOSIS — R5381 Other malaise: Secondary | ICD-10-CM | POA: Diagnosis present

## 2019-11-17 DIAGNOSIS — A419 Sepsis, unspecified organism: Principal | ICD-10-CM

## 2019-11-17 DIAGNOSIS — E872 Acidosis, unspecified: Secondary | ICD-10-CM | POA: Insufficient documentation

## 2019-11-17 DIAGNOSIS — Z880 Allergy status to penicillin: Secondary | ICD-10-CM

## 2019-11-17 DIAGNOSIS — I5030 Unspecified diastolic (congestive) heart failure: Secondary | ICD-10-CM | POA: Diagnosis not present

## 2019-11-17 DIAGNOSIS — R569 Unspecified convulsions: Secondary | ICD-10-CM

## 2019-11-17 DIAGNOSIS — L89152 Pressure ulcer of sacral region, stage 2: Secondary | ICD-10-CM | POA: Diagnosis present

## 2019-11-17 DIAGNOSIS — Z90710 Acquired absence of both cervix and uterus: Secondary | ICD-10-CM | POA: Diagnosis not present

## 2019-11-17 DIAGNOSIS — J189 Pneumonia, unspecified organism: Secondary | ICD-10-CM | POA: Diagnosis present

## 2019-11-17 DIAGNOSIS — F0150 Vascular dementia without behavioral disturbance: Secondary | ICD-10-CM | POA: Diagnosis present

## 2019-11-17 DIAGNOSIS — F0390 Unspecified dementia without behavioral disturbance: Secondary | ICD-10-CM | POA: Diagnosis present

## 2019-11-17 DIAGNOSIS — R4182 Altered mental status, unspecified: Secondary | ICD-10-CM | POA: Diagnosis not present

## 2019-11-17 DIAGNOSIS — Z7189 Other specified counseling: Secondary | ICD-10-CM

## 2019-11-17 DIAGNOSIS — R1312 Dysphagia, oropharyngeal phase: Secondary | ICD-10-CM | POA: Diagnosis present

## 2019-11-17 DIAGNOSIS — Z20822 Contact with and (suspected) exposure to covid-19: Secondary | ICD-10-CM | POA: Diagnosis present

## 2019-11-17 DIAGNOSIS — E538 Deficiency of other specified B group vitamins: Secondary | ICD-10-CM | POA: Diagnosis present

## 2019-11-17 DIAGNOSIS — D709 Neutropenia, unspecified: Secondary | ICD-10-CM

## 2019-11-17 DIAGNOSIS — E778 Other disorders of glycoprotein metabolism: Secondary | ICD-10-CM | POA: Diagnosis present

## 2019-11-17 DIAGNOSIS — K5909 Other constipation: Secondary | ICD-10-CM | POA: Diagnosis present

## 2019-11-17 DIAGNOSIS — Z66 Do not resuscitate: Secondary | ICD-10-CM | POA: Diagnosis present

## 2019-11-17 DIAGNOSIS — I5031 Acute diastolic (congestive) heart failure: Secondary | ICD-10-CM | POA: Diagnosis not present

## 2019-11-17 DIAGNOSIS — Z8542 Personal history of malignant neoplasm of other parts of uterus: Secondary | ICD-10-CM | POA: Diagnosis not present

## 2019-11-17 DIAGNOSIS — E43 Unspecified severe protein-calorie malnutrition: Secondary | ICD-10-CM | POA: Diagnosis present

## 2019-11-17 DIAGNOSIS — Z515 Encounter for palliative care: Secondary | ICD-10-CM | POA: Diagnosis not present

## 2019-11-17 DIAGNOSIS — Z681 Body mass index (BMI) 19 or less, adult: Secondary | ICD-10-CM

## 2019-11-17 DIAGNOSIS — Z789 Other specified health status: Secondary | ICD-10-CM

## 2019-11-17 DIAGNOSIS — R1310 Dysphagia, unspecified: Secondary | ICD-10-CM

## 2019-11-17 DIAGNOSIS — J9611 Chronic respiratory failure with hypoxia: Secondary | ICD-10-CM | POA: Diagnosis not present

## 2019-11-17 DIAGNOSIS — Z7982 Long term (current) use of aspirin: Secondary | ICD-10-CM

## 2019-11-17 DIAGNOSIS — G9341 Metabolic encephalopathy: Secondary | ICD-10-CM | POA: Diagnosis present

## 2019-11-17 DIAGNOSIS — E874 Mixed disorder of acid-base balance: Secondary | ICD-10-CM | POA: Diagnosis present

## 2019-11-17 DIAGNOSIS — R0603 Acute respiratory distress: Secondary | ICD-10-CM

## 2019-11-17 DIAGNOSIS — J690 Pneumonitis due to inhalation of food and vomit: Secondary | ICD-10-CM | POA: Diagnosis present

## 2019-11-17 DIAGNOSIS — E873 Alkalosis: Secondary | ICD-10-CM | POA: Diagnosis not present

## 2019-11-17 DIAGNOSIS — G9340 Encephalopathy, unspecified: Secondary | ICD-10-CM | POA: Diagnosis not present

## 2019-11-17 DIAGNOSIS — R627 Adult failure to thrive: Secondary | ICD-10-CM | POA: Diagnosis present

## 2019-11-17 HISTORY — DX: Unspecified dementia, unspecified severity, without behavioral disturbance, psychotic disturbance, mood disturbance, and anxiety: F03.90

## 2019-11-17 LAB — URINALYSIS, ROUTINE W REFLEX MICROSCOPIC
Bilirubin Urine: NEGATIVE
Glucose, UA: NEGATIVE mg/dL
Ketones, ur: NEGATIVE mg/dL
Leukocytes,Ua: NEGATIVE
Nitrite: NEGATIVE
Protein, ur: 30 mg/dL — AB
Specific Gravity, Urine: >1.03 — ABNORMAL HIGH (ref 1.005–1.030)
pH: 5 (ref 5.0–8.0)

## 2019-11-17 LAB — COMPREHENSIVE METABOLIC PANEL
ALT: 26 U/L (ref 0–44)
AST: 48 U/L — ABNORMAL HIGH (ref 15–41)
Albumin: 2.9 g/dL — ABNORMAL LOW (ref 3.5–5.0)
Alkaline Phosphatase: 61 U/L (ref 38–126)
Anion gap: 17 — ABNORMAL HIGH (ref 5–15)
BUN: 23 mg/dL (ref 8–23)
CO2: 24 mmol/L (ref 22–32)
Calcium: 8.9 mg/dL (ref 8.9–10.3)
Chloride: 97 mmol/L — ABNORMAL LOW (ref 98–111)
Creatinine, Ser: 1.11 mg/dL — ABNORMAL HIGH (ref 0.44–1.00)
GFR calc Af Amer: 54 mL/min — ABNORMAL LOW (ref >60)
GFR calc non Af Amer: 46 mL/min — ABNORMAL LOW (ref >60)
Glucose, Bld: 144 mg/dL — ABNORMAL HIGH (ref 70–99)
Potassium: 3.8 mmol/L (ref 3.5–5.1)
Sodium: 138 mmol/L (ref 135–145)
Total Bilirubin: 1.1 mg/dL (ref 0.3–1.2)
Total Protein: 6.4 g/dL — ABNORMAL LOW (ref 6.5–8.1)

## 2019-11-17 LAB — BRAIN NATRIURETIC PEPTIDE: B Natriuretic Peptide: 236.9 pg/mL — ABNORMAL HIGH (ref 0.0–100.0)

## 2019-11-17 LAB — PROCALCITONIN: Procalcitonin: 18.56 ng/mL

## 2019-11-17 LAB — ECHOCARDIOGRAM COMPLETE
Area-P 1/2: 5.02 cm2
Height: 65 in
S' Lateral: 2.4 cm
Weight: 2469.15 oz

## 2019-11-17 LAB — CBC WITH DIFFERENTIAL/PLATELET
Abs Immature Granulocytes: 0.02 10*3/uL (ref 0.00–0.07)
Basophils Absolute: 0 10*3/uL (ref 0.0–0.1)
Basophils Relative: 0 %
Eosinophils Absolute: 0 10*3/uL (ref 0.0–0.5)
Eosinophils Relative: 0 %
HCT: 47.4 % — ABNORMAL HIGH (ref 36.0–46.0)
Hemoglobin: 14.2 g/dL (ref 12.0–15.0)
Immature Granulocytes: 1 %
Lymphocytes Relative: 35 %
Lymphs Abs: 0.9 10*3/uL (ref 0.7–4.0)
MCH: 25.7 pg — ABNORMAL LOW (ref 26.0–34.0)
MCHC: 30 g/dL (ref 30.0–36.0)
MCV: 85.9 fL (ref 80.0–100.0)
Monocytes Absolute: 0.1 10*3/uL (ref 0.1–1.0)
Monocytes Relative: 4 %
Neutro Abs: 1.5 10*3/uL — ABNORMAL LOW (ref 1.7–7.7)
Neutrophils Relative %: 60 %
Platelets: 210 10*3/uL (ref 150–400)
RBC: 5.52 MIL/uL — ABNORMAL HIGH (ref 3.87–5.11)
RDW: 14.6 % (ref 11.5–15.5)
WBC: 2.5 10*3/uL — ABNORMAL LOW (ref 4.0–10.5)
nRBC: 0 % (ref 0.0–0.2)

## 2019-11-17 LAB — I-STAT VENOUS BLOOD GAS, ED
Acid-Base Excess: 1 mmol/L (ref 0.0–2.0)
Bicarbonate: 30.6 mmol/L — ABNORMAL HIGH (ref 20.0–28.0)
Calcium, Ion: 1.16 mmol/L (ref 1.15–1.40)
HCT: 40 % (ref 36.0–46.0)
Hemoglobin: 13.6 g/dL (ref 12.0–15.0)
O2 Saturation: 93 %
Potassium: 3.1 mmol/L — ABNORMAL LOW (ref 3.5–5.1)
Sodium: 137 mmol/L (ref 135–145)
TCO2: 33 mmol/L — ABNORMAL HIGH (ref 22–32)
pCO2, Ven: 74.5 mmHg (ref 44.0–60.0)
pH, Ven: 7.221 — ABNORMAL LOW (ref 7.250–7.430)
pO2, Ven: 83 mmHg — ABNORMAL HIGH (ref 32.0–45.0)

## 2019-11-17 LAB — LACTIC ACID, PLASMA
Lactic Acid, Venous: 8.1 mmol/L (ref 0.5–1.9)
Lactic Acid, Venous: 7.1 mmol/L (ref 0.5–1.9)
Lactic Acid, Venous: 6.3 mmol/L (ref 0.5–1.9)
Lactic Acid, Venous: 3.9 mmol/L (ref 0.5–1.9)

## 2019-11-17 LAB — TROPONIN I (HIGH SENSITIVITY)
Troponin I (High Sensitivity): 11 ng/L (ref <18)
Troponin I (High Sensitivity): 12 ng/L (ref <18)
Troponin I (High Sensitivity): 285 ng/L (ref <18)

## 2019-11-17 LAB — URINALYSIS, MICROSCOPIC (REFLEX)

## 2019-11-17 LAB — TSH: TSH: 1.503 u[IU]/mL (ref 0.350–4.500)

## 2019-11-17 LAB — HEMOGLOBIN A1C
Hgb A1c MFr Bld: 5.2 % (ref 4.8–5.6)
Mean Plasma Glucose: 102.54 mg/dL

## 2019-11-17 LAB — APTT: aPTT: 34 seconds (ref 24–36)

## 2019-11-17 LAB — EXPECTORATED SPUTUM ASSESSMENT W GRAM STAIN, RFLX TO RESP C

## 2019-11-17 LAB — RESPIRATORY PANEL BY RT PCR (FLU A&B, COVID)
Influenza A by PCR: NEGATIVE
Influenza B by PCR: NEGATIVE
SARS Coronavirus 2 by RT PCR: NEGATIVE

## 2019-11-17 LAB — PROTIME-INR
INR: 1.3 — ABNORMAL HIGH (ref 0.8–1.2)
Prothrombin Time: 15.3 seconds — ABNORMAL HIGH (ref 11.4–15.2)

## 2019-11-17 LAB — GLUCOSE, CAPILLARY: Glucose-Capillary: 96 mg/dL (ref 70–99)

## 2019-11-17 LAB — MRSA PCR SCREENING: MRSA by PCR: NEGATIVE

## 2019-11-17 LAB — STREP PNEUMONIAE URINARY ANTIGEN: Strep Pneumo Urinary Antigen: NEGATIVE

## 2019-11-17 IMAGING — DX DG CHEST 1V PORT
1 series · 1 of 1 positions shown · non-contrast
Comparison: [DATE]

CLINICAL DATA: Increasing shortness of breath

EXAM:
PORTABLE CHEST 1 VIEW

[chest ap]
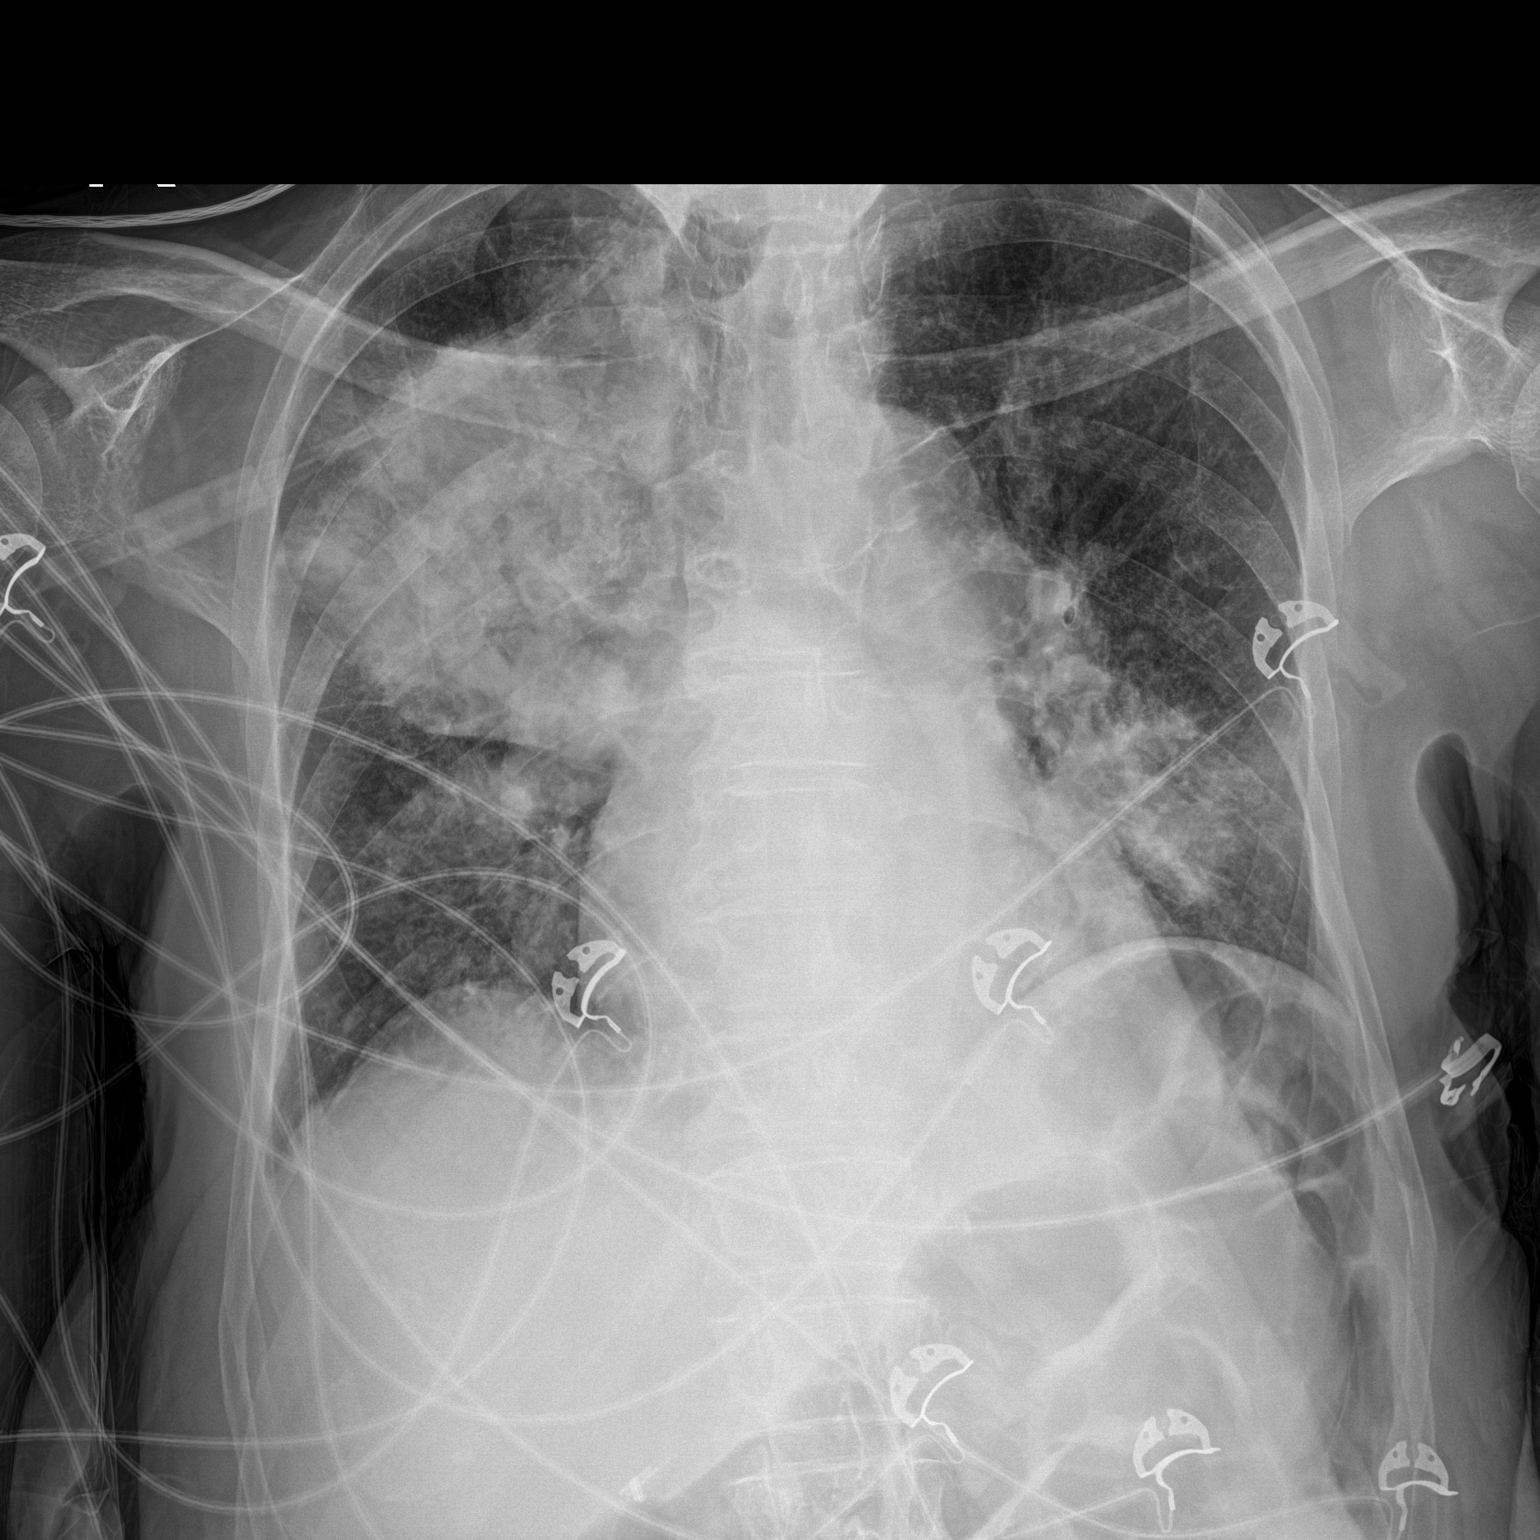

[1 of 1 positions shown; findings below may reference images not displayed]

FINDINGS: Cardiac shadow is within normal limits. Aortic calcifications are
seen. Patchy bilateral airspace opacities are noted worst in the
right upper lobe. Given the abrupt onset from the prior exam and
likely represent multifocal pneumonia. No sizable effusion is seen.
No bony abnormality is noted.
IMPRESSION: Bilateral infiltrates worse in the right upper lobe. Followup PA and
lateral chest X-ray is recommended in 3-4 weeks following trial of
antibiotic therapy to ensure resolution and exclude underlying
malignancy.

## 2019-11-17 IMAGING — CT CT CHEST W/O CM
2 of 4 series · 15 of 36 positions shown, 18 images · non-contrast
Comparison: [DATE]

CLINICAL DATA: Respiratory failure

EXAM:
CT CHEST WITHOUT CONTRAST
TECHNIQUE: Multidetector CT imaging of the chest was performed following the
standard protocol without IV contrast.

[Series 3: chest wo · axial · 0.59mm/px · z∈[+1150,+1414]mm · 12 of 158 slices shown, 15 images]
[im 13/158  mediastinal]
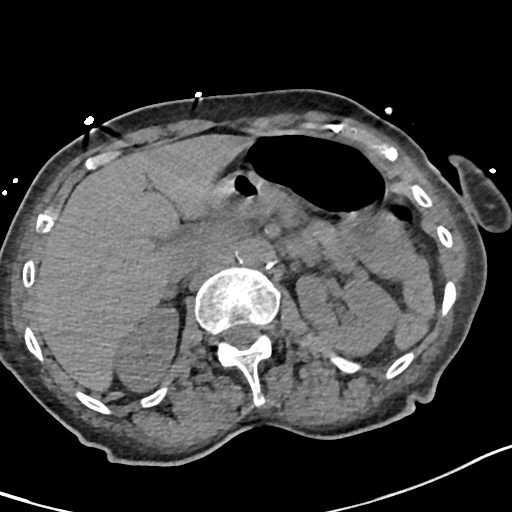
[im 13/158  lung]
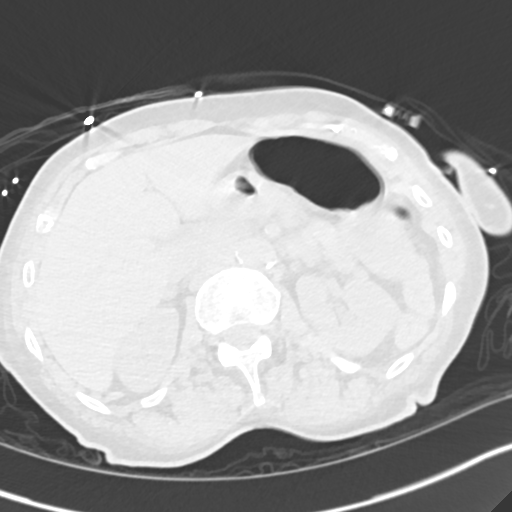
[im 25/158  lung]
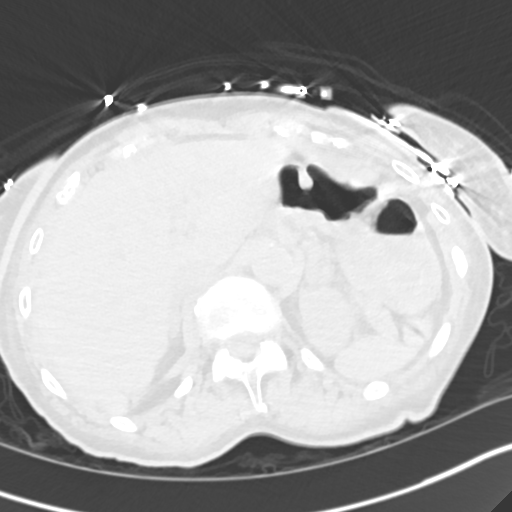
[im 37/158  lung]
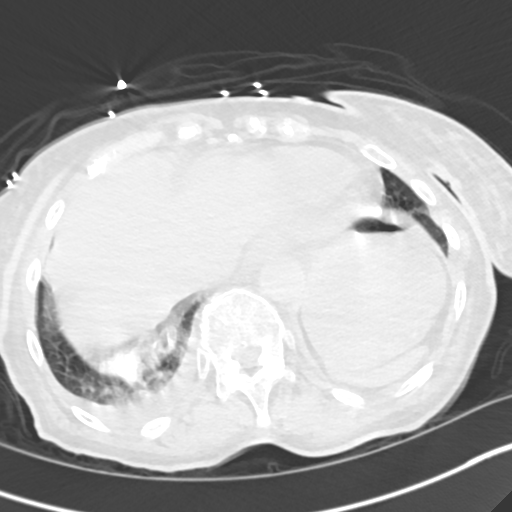
[im 49/158  lung]
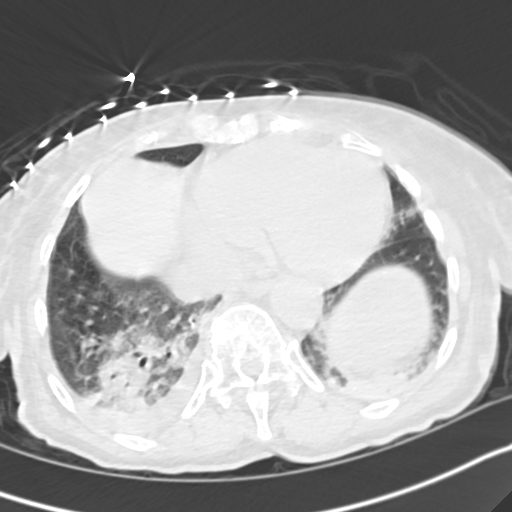
[im 61/158  mediastinal]
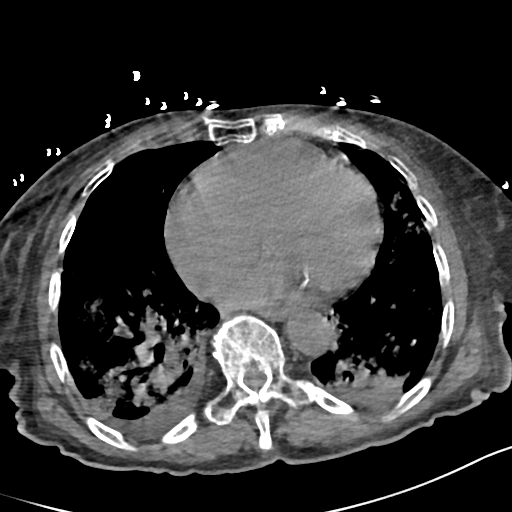
[im 61/158  lung]
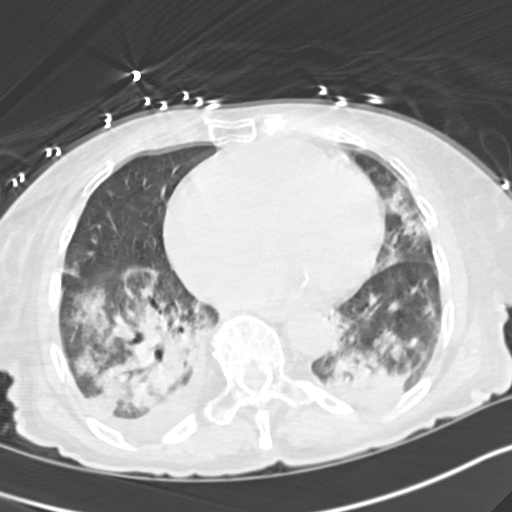
[im 73/158  lung]
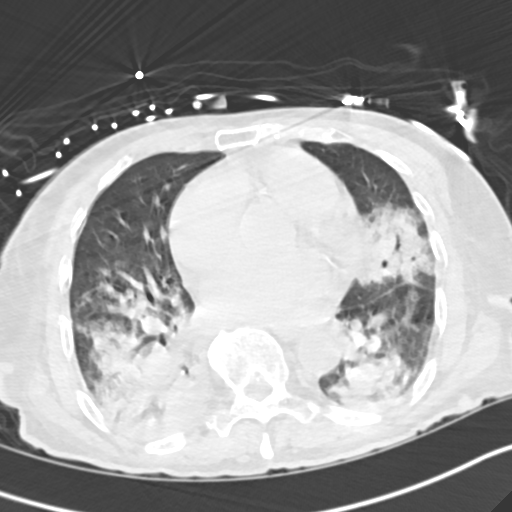
[im 85/158  lung]
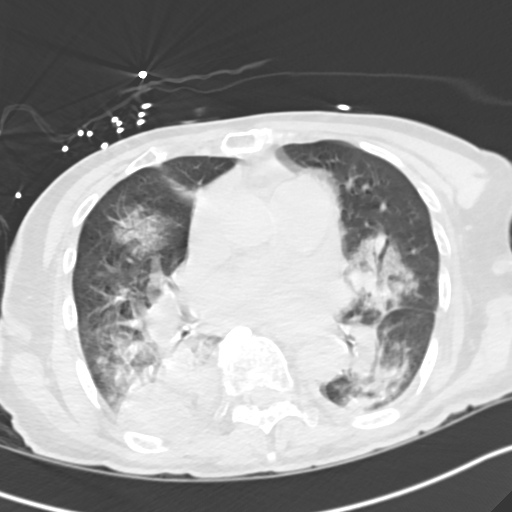
[im 97/158  lung]
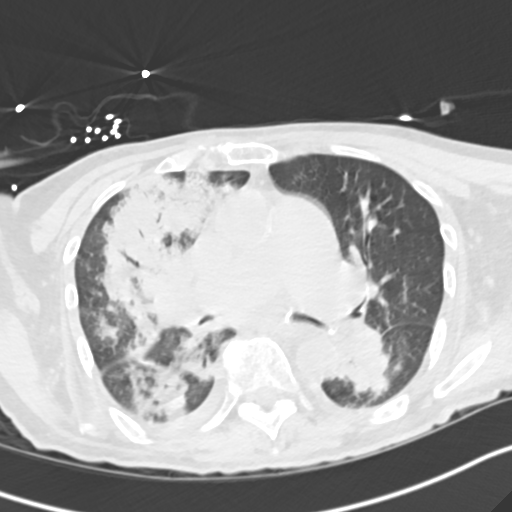
[im 109/158  mediastinal]
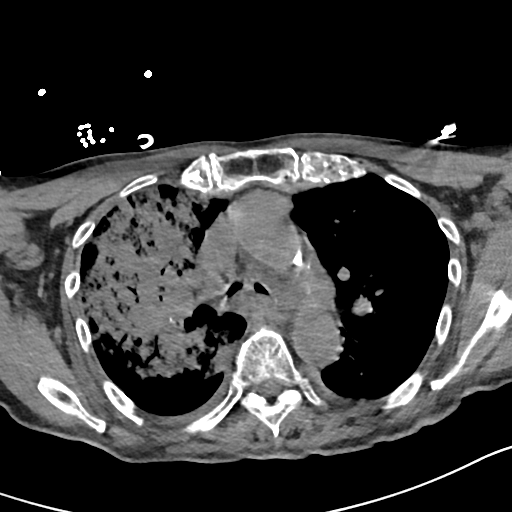
[im 109/158  lung]
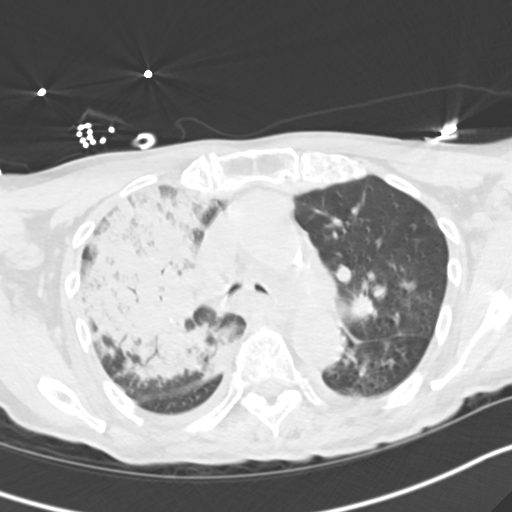
[im 121/158  lung]
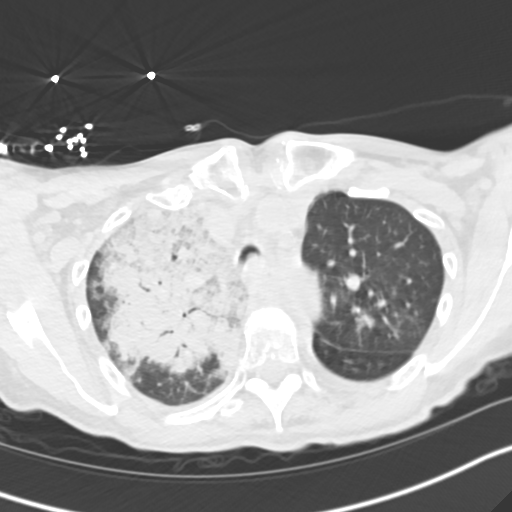
[im 133/158  lung]
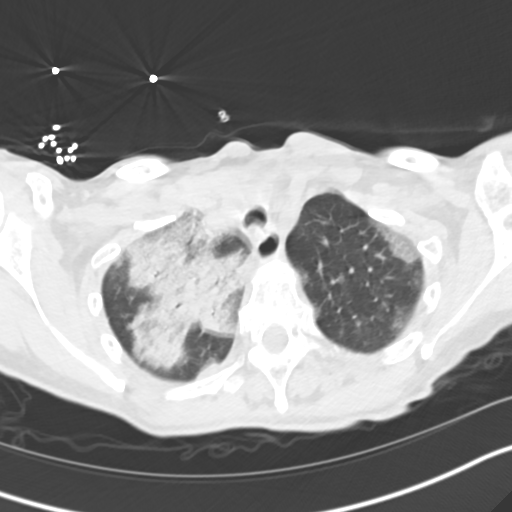
[im 145/158  lung]
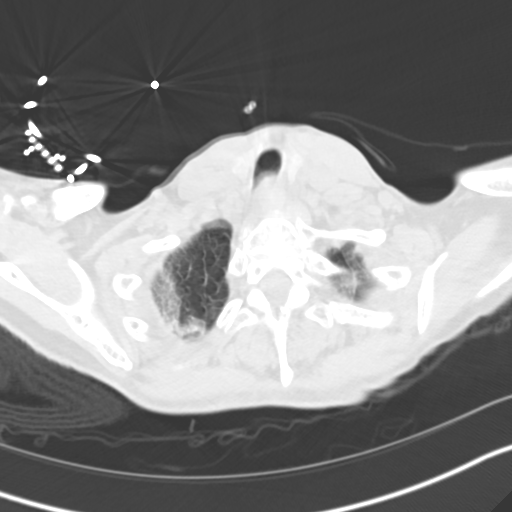

[Series 6: cor · coronal · 0.65mm/px · 3 of 122 slices shown]
[im 25/122  lung]
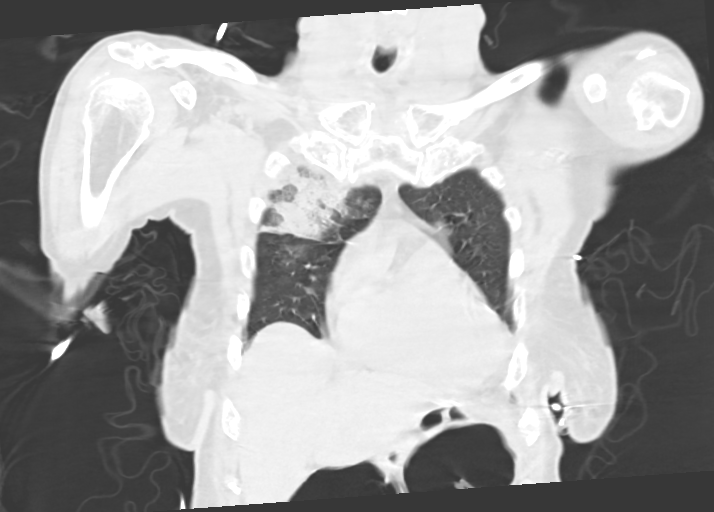
[im 49/122  lung]
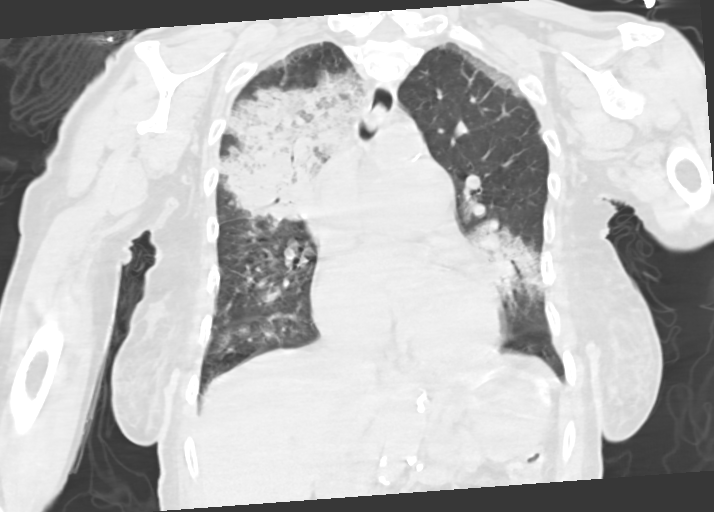
[im 73/122  lung]
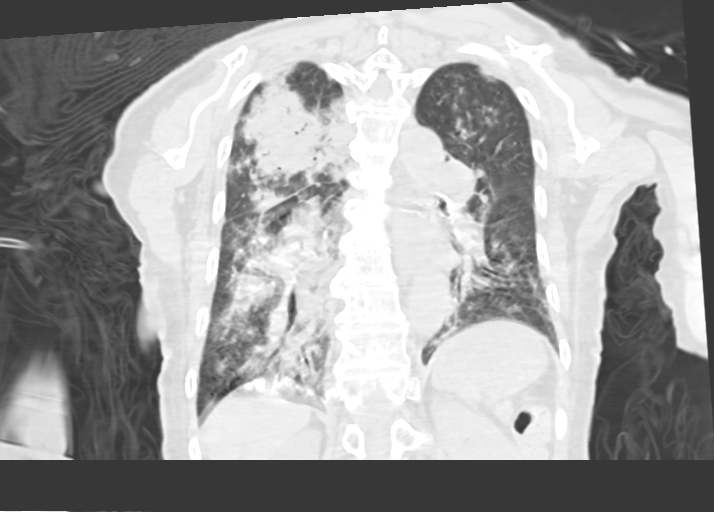

[15 of 36 positions shown; findings below may reference images not displayed]

FINDINGS: Cardiovascular: Cardiac size is at the upper limits of normal.
Minimal coronary artery calcification. No pericardial effusion.
There is marked enlargement of the central pulmonary arteries in
keeping with changes of pulmonary arterial hypertension. Mild
atherosclerotic calcification is seen within the thoracic aorta. No
aortic aneurysm.

Mediastinum/Nodes: No pathologic thoracic adenopathy. Thyroid
unremarkable.

Lungs/Pleura: There is extensive multifocal consolidation, most
severe within the right upper lobe, in keeping with multifocal
pneumonia in the appropriate clinical setting. Evaluation is limited
by motion artifact and expiratory phase imaging, however, no central
obstructing mass is identified. Small right and trace left pleural
effusions are present. No pneumothorax.

Upper Abdomen: Cholecystectomy has been performed.

Musculoskeletal: No acute bone abnormality.
IMPRESSION: Extensive multifocal pulmonary consolidation in keeping with
multifocal pneumonia in the appropriate clinical setting. No central
obstructing mass.

Marked pulmonary arterial enlargement in keeping with pulmonary
artery hypertension.

Aortic Atherosclerosis ([1K]-[1K]).  It is

## 2019-11-17 MED ORDER — ASPIRIN EC 81 MG PO TBEC
81.0000 mg | DELAYED_RELEASE_TABLET | Freq: Every day | ORAL | Status: DC
  Administered 2019-11-18 – 2019-11-24 (×5): 81 mg via ORAL
  Filled 2019-11-17 (×6): qty 1

## 2019-11-17 MED ORDER — LACTATED RINGERS IV BOLUS
1000.0000 mL | Freq: Once | INTRAVENOUS | Status: AC
  Administered 2019-11-17: 1000 mL via INTRAVENOUS

## 2019-11-17 MED ORDER — ENOXAPARIN SODIUM 40 MG/0.4ML ~~LOC~~ SOLN: 40.0000 mg | SUBCUTANEOUS | Status: DC

## 2019-11-17 MED ORDER — LACTATED RINGERS IV BOLUS (SEPSIS)
1000.0000 mL | Freq: Once | INTRAVENOUS | Status: AC
  Administered 2019-11-17: 1000 mL via INTRAVENOUS

## 2019-11-17 MED ORDER — LACTATED RINGERS IV SOLN: INTRAVENOUS | Status: DC

## 2019-11-17 MED ORDER — FUROSEMIDE 10 MG/ML IJ SOLN
40.0000 mg | Freq: Once | INTRAMUSCULAR | Status: AC
  Administered 2019-11-17: 40 mg via INTRAVENOUS

## 2019-11-17 MED ORDER — METOPROLOL TARTRATE 5 MG/5ML IV SOLN
2.5000 mg | Freq: Three times a day (TID) | INTRAVENOUS | Status: DC
  Administered 2019-11-17: 2.5 mg via INTRAVENOUS
  Filled 2019-11-17: qty 5

## 2019-11-17 MED ORDER — METRONIDAZOLE IN NACL 5-0.79 MG/ML-% IV SOLN
500.0000 mg | Freq: Three times a day (TID) | INTRAVENOUS | Status: DC
  Administered 2019-11-17 – 2019-11-18 (×5): 500 mg via INTRAVENOUS
  Filled 2019-11-17 (×5): qty 100

## 2019-11-17 MED ORDER — RINGERS IV SOLN: INTRAVENOUS | Status: DC

## 2019-11-17 MED ORDER — ORAL CARE MOUTH RINSE
15.0000 mL | Freq: Two times a day (BID) | OROMUCOSAL | Status: DC
  Administered 2019-11-18 – 2019-12-19 (×43): 15 mL via OROMUCOSAL

## 2019-11-17 MED ORDER — LEVOFLOXACIN IN D5W 500 MG/100ML IV SOLN
500.0000 mg | INTRAVENOUS | Status: DC
  Administered 2019-11-17: 500 mg via INTRAVENOUS
  Filled 2019-11-17 (×2): qty 100

## 2019-11-17 MED ORDER — SODIUM CHLORIDE 3 % IN NEBU
4.0000 mL | INHALATION_SOLUTION | Freq: Three times a day (TID) | RESPIRATORY_TRACT | Status: AC
  Administered 2019-11-17 – 2019-11-19 (×6): 4 mL via RESPIRATORY_TRACT
  Filled 2019-11-17 (×11): qty 4

## 2019-11-17 MED ORDER — LEVOFLOXACIN IN D5W 750 MG/150ML IV SOLN
750.0000 mg | Freq: Once | INTRAVENOUS | Status: AC
  Administered 2019-11-17: 750 mg via INTRAVENOUS
  Filled 2019-11-17: qty 150

## 2019-11-17 MED ORDER — LEVALBUTEROL HCL 0.63 MG/3ML IN NEBU
0.6300 mg | INHALATION_SOLUTION | Freq: Four times a day (QID) | RESPIRATORY_TRACT | Status: DC | PRN
  Administered 2019-11-18: 0.63 mg via RESPIRATORY_TRACT
  Filled 2019-11-17: qty 3

## 2019-11-17 MED ORDER — ALBUTEROL SULFATE (2.5 MG/3ML) 0.083% IN NEBU
2.5000 mg | INHALATION_SOLUTION | Freq: Four times a day (QID) | RESPIRATORY_TRACT | Status: DC
  Administered 2019-11-17 – 2019-11-22 (×22): 2.5 mg via RESPIRATORY_TRACT
  Filled 2019-11-17 (×23): qty 3

## 2019-11-17 MED ORDER — SENNOSIDES-DOCUSATE SODIUM 8.6-50 MG PO TABS
1.0000 | ORAL_TABLET | Freq: Two times a day (BID) | ORAL | Status: DC
  Administered 2019-11-18 – 2019-11-24 (×9): 1 via ORAL
  Filled 2019-11-17 (×11): qty 1

## 2019-11-17 MED ORDER — DEXTROSE IN LACTATED RINGERS 5 % IV SOLN
INTRAVENOUS | Status: DC
  Administered 2019-11-18: 1000 mL via INTRAVENOUS

## 2019-11-17 MED ORDER — SODIUM CHLORIDE 0.9 % IV SOLN: INTRAVENOUS | Status: DC

## 2019-11-17 MED ORDER — CHLORHEXIDINE GLUCONATE 0.12 % MT SOLN
15.0000 mL | Freq: Two times a day (BID) | OROMUCOSAL | Status: DC
  Administered 2019-11-17 – 2019-12-19 (×53): 15 mL via OROMUCOSAL
  Filled 2019-11-17 (×50): qty 15

## 2019-11-17 MED ORDER — VALPROATE SODIUM 500 MG/5ML IV SOLN
250.0000 mg | Freq: Two times a day (BID) | INTRAVENOUS | Status: DC
  Administered 2019-11-17 – 2019-11-20 (×7): 250 mg via INTRAVENOUS
  Filled 2019-11-17 (×9): qty 2.5

## 2019-11-17 MED ORDER — LACTATED RINGERS IV BOLUS (SEPSIS)
250.0000 mL | Freq: Once | INTRAVENOUS | Status: AC
  Administered 2019-11-17: 250 mL via INTRAVENOUS

## 2019-11-17 MED ORDER — VANCOMYCIN HCL IN DEXTROSE 1-5 GM/200ML-% IV SOLN
1000.0000 mg | INTRAVENOUS | Status: DC
  Administered 2019-11-17 – 2019-11-18 (×2): 1000 mg via INTRAVENOUS
  Filled 2019-11-17 (×3): qty 200

## 2019-11-17 MED ORDER — METOPROLOL TARTRATE 5 MG/5ML IV SOLN
2.5000 mg | Freq: Three times a day (TID) | INTRAVENOUS | Status: DC | PRN
  Filled 2019-11-17: qty 5

## 2019-11-17 MED ORDER — VITAMIN B-12 100 MCG PO TABS
100.0000 ug | ORAL_TABLET | Freq: Every day | ORAL | Status: DC
  Administered 2019-11-18 – 2019-11-24 (×5): 100 ug via ORAL
  Filled 2019-11-17 (×10): qty 1

## 2019-11-17 MED ORDER — FUROSEMIDE 10 MG/ML IJ SOLN
INTRAMUSCULAR | Status: AC
  Filled 2019-11-17: qty 4

## 2019-11-17 MED ORDER — ACETYLCYSTEINE 10 % IN SOLN: 4.0000 mL | Freq: Once | RESPIRATORY_TRACT | Status: DC

## 2019-11-17 MED ORDER — VITAMIN D (ERGOCALCIFEROL) 1.25 MG (50000 UNIT) PO CAPS: 50000.0000 [IU] | ORAL_CAPSULE | ORAL | Status: DC

## 2019-11-17 MED ORDER — SODIUM CHLORIDE 0.9 % IV BOLUS
1000.0000 mL | Freq: Once | INTRAVENOUS | Status: AC
  Administered 2019-11-17: 1000 mL via INTRAVENOUS

## 2019-11-17 MED ORDER — ENOXAPARIN SODIUM 40 MG/0.4ML ~~LOC~~ SOLN
40.0000 mg | Freq: Every day | SUBCUTANEOUS | Status: DC
  Administered 2019-11-17 – 2019-11-30 (×14): 40 mg via SUBCUTANEOUS
  Filled 2019-11-17 (×14): qty 0.4

## 2019-11-17 MED ORDER — CHLORHEXIDINE GLUCONATE CLOTH 2 % EX PADS
6.0000 | MEDICATED_PAD | Freq: Every day | CUTANEOUS | Status: DC
  Administered 2019-11-17 – 2019-11-26 (×5): 6 via TOPICAL

## 2019-11-17 NOTE — Progress Notes (Signed)
Informed that patient is a [facility] patient. [facility] will take over care on 11/18/19 at 7 AM.

## 2019-11-17 NOTE — ED Notes (Signed)
Pt at bedside

## 2019-11-17 NOTE — Progress Notes (Signed)
Patient seen and examined. HPI reviewed. Patient is alert, weak cough, congested.  Lungs; B/L crackles, tachypnea on 5 L oxygen/  BP 110 ranges.   Plan;  1-Sepsis; secondary to Multifocal PNA;  -Continue with IV fluids.  -_IV antibiotics.  -lactic acid at 6.  -CCM consultation due to severe sepsis and Respiratory failure.  2-Acute Hypoxic Respiratory Failure; Secondary; Multifocal PNA; -Continue with IV antibiotics.  -Tachypnea, , JVD positive, weak cough.  -Continue with oxygen supplementation.  -CCM consulted.  -Nebulizer ordered/   3-Dysphagia; NPO Speech swallow.   4-AKI; IV fluids.  5-Acute on chronic diastolic HF;  received one dose of lasix.  Hold lasix for now due to sepsis.   6-Sinus tachycardia, HTN; change BB to PRN.   Daughter updated.

## 2019-11-17 NOTE — Progress Notes (Signed)
Critical Troponin result: 285  [facility] notified 2020 11/17/19

## 2019-11-17 NOTE — Consult Note (Addendum)
NAME:  Nicole Blackwell, MRN:  [id number], DOB:  [birth year], LOS: 0 ADMISSION DATE:  11/17/2019, CONSULTATION DATE: 11/17/2019 REFERRING MD: [facility], CHIEF COMPLAINT: Failure to thrive, pneumonia sepsis lactic acidosis  Brief History   84 year old female skilled nursing facility resident with known dysphagia presents with sepsis pneumonia lactic acidosis  History of present illness   84 year old female is mental decline for approximately 6 months and is now lives in a skilled nursing facility secondary to, dysphagia.  Failure to thrive on outpatient basis.  She has an extensive past medical history which is documented below 11/17/2019 presented to the emergency department secondary to shortness of breath and found to have an elevated procalcitonin, elevated lactic acid, CT scan with bilateral difficulty with cough mechanics and unable to cough up purulent material.  NTS at the bedside per with purulent secretions obtained.  Clearing of her oral secretions.  Currently she is hemodynamically stable except for tachycardia blood pressure is adequate O2 saturations are 100% on 2 L nasal cannula.  Pulmonary critical care has been asked to evaluate and take over due to extensive pneumonia refractory state.  Past Medical History   Past Medical History:  Diagnosis Date  . BACK PAIN   . Dementia ([place])   . Hypertension   . OSTEOPOROSIS   . PLANTAR FASCIITIS   . Positive PPD   . Seizures ([place])   . SHINGLES   . SYMPTOMATIC MENOPAUSAL/FEMALE CLIMACTERIC STATES   . Syncope and collapse 12/13/2014  . Uterine cancer ([place])      Significant Hospital Events   11/17/2019 admitted for multifocal pneumonia, failure to thrive, inability to clear secretions  Consults:  11/17/2019 pulmonary critical care  Procedures:   Significant Diagnostic Tests:  CT of the chest which revealed bilateral airspace disease right greater than left consistent with multifocal pneumonia  Micro Data:  11/17/2018 blood cultures  x2>> 11/17/2019 urine culture>> Sputum culture if able    Antimicrobials:  11/17/2019 vancomycin>> 11/17/2019 Levaquin>> 11/17/2019/20>>    Interim history/subjective:  84 year old female with a plethora of health issues Initially tried hospital service with sepsis multifocal pneumonia pulmonary critical care asked to evaluate  Objective   Blood pressure (!) 146/87, pulse (!) 115, temperature 98.9 F (37.2 C), temperature source Oral, resp. rate (!) 25, height 5\' 5"  (1.651 m), weight 70 kg, SpO2 100 %.        Intake/Output Summary (Last 24 hours) at 11/17/2019 1660 Last data filed at 11/17/2019 0421 Gross per 24 hour  Intake 2500 ml  Output 110 ml  Net 2390 ml   Filed Weights   11/17/19 0043  Weight: 70 kg    Examination: General: Frail cachectic female who follows commands and answers questions HENT: Positive for JVD Lungs: Coarse rhonchi bilaterally  L at the level of the vocal cords which clear somewhat with NTS Cardiovascular: Heart sounds are regular regular rate rhythm sinus tach Abdomen: Soft nontender positive bowel sounds Extremities: Warm dry without edema Neuro: Grossly intact follows commands  Resolved Hospital Problem list     Assessment & Plan:  Respiratory distress in the setting of chronic aspiration bilateral pneumonia complicated by sepsis along with poor cough mechanics.  NTS x1 with copious material obtained Pulmonary toilet as able Stop all oral feedings She will need an NG tube/feeding tube Swallow evaluation known history of dysphagia Empirical antimicrobial therapy currently on Levaquin Flagyl and vancomycin due to penicillin allergies Sputum culture if able In culture Admit to ICU PCCM will assume care  Sepsis most  likely source is aspiration and with a lactic acidosis eventually 8.1 Likely secondary to aspiration Continue fluids as able note to echo 08/28/2018 centric hypertrophy EF 65% Monitor lactic acid  History of dementia and  seizures dysfunction noted I hate you because you types hello  CT of the head 10/21/2019 with no acute intracranial abnormality Continue antiepileptics Continue to monitor Continue Keppra  History of congestive heart failure Repeat 2D echo has been ordered Strict [employer] antihypertensives No diuretics secondary to sepsis  Vitamin B12 deficiency Monitor for now   Best practice:  Diet: NPO Pain/Anxiety/Delirium protocol (if indicated): As needed VAP protocol (if indicated): Treatment for aspiration pneumonia from a nursing home DVT prophylaxis: Heparin GI prophylaxis: PPI Glucose control: Sliding scale insulin protocol Mobility: Bedrest   Code Status: Full Family Communication: Daughter was called by [facility] earlier in the day Disposition: ICU  Labs   CBC: Recent Labs  Lab 11/17/19 0123  WBC 2.5*  NEUTROABS 1.5*  HGB 14.2  HCT 47.4*  MCV 85.9  PLT 440    Basic Metabolic Panel: Recent Labs  Lab 11/17/19 0123  NA 138  K 3.8  CL 97*  CO2 24  GLUCOSE 144*  BUN 23  CREATININE 1.11*  CALCIUM 8.9   GFR: Estimated Creatinine Clearance: 38.4 mL/min (A) (by C-G formula based on SCr of 1.11 mg/dL (H)). Recent Labs  Lab 11/17/19 0123 11/17/19 0246 11/17/19 0305 11/17/19 0600  PROCALCITON  --   --  18.56  --   WBC 2.5*  --   --   --   LATICACIDVEN 8.1* 7.1*  --  6.3*    Liver Function Tests: Recent Labs  Lab 11/17/19 0123  AST 48*  ALT 26  ALKPHOS 61  BILITOT 1.1  PROT 6.4*  ALBUMIN 2.9*   No results for input(s): LIPASE, AMYLASE in the last 168 hours. No results for input(s): AMMONIA in the last 168 hours.  ABG    Component Value Date/Time   TCO2 25 12/17/2018 1257     Coagulation Profile: Recent Labs  Lab 11/17/19 0123  INR 1.3*    Cardiac Enzymes: No results for input(s): CKTOTAL, CKMB, CKMBINDEX, TROPONINI in the last 168 hours.  HbA1C: Hgb A1c MFr Bld  Date/Time Value Ref Range Status  02/28/2019 07:32 PM 5.6 4.8 - 5.6 %  Final    Comment:    (NOTE) Pre diabetes:          5.7%-6.4% Diabetes:              >6.4% Glycemic control for   <7.0% adults with diabetes     CBG: No results for input(s): GLUCAP in the last 168 hours.  Review of Systems:   na  Past Medical History  She,  has a past medical history of BACK PAIN, Dementia ([place]), Hypertension, OSTEOPOROSIS, PLANTAR FASCIITIS, Positive PPD, Seizures ([place]), SHINGLES, SYMPTOMATIC MENOPAUSAL/FEMALE CLIMACTERIC STATES, Syncope and collapse (12/13/2014), and Uterine cancer ([place]).   Surgical History    Past Surgical History:  Procedure Laterality Date  . CATARACT EXTRACTION, BILATERAL Bilateral   . DILATION AND CURETTAGE OF UTERUS  1960's X 2   "I lost 2 children to miscarriage"  . LAPAROSCOPIC CHOLECYSTECTOMY    . SIGMOIDOSCOPY    . TONSILLECTOMY    . VAGINAL HYSTERECTOMY       Social History   reports that she has never smoked. She has never used smokeless tobacco. She reports that she does not drink alcohol and does not use drugs.  Family History   Her family history includes Healthy in her father and mother.   Allergies Allergies  Allergen Reactions  . Penicillin G Benzathine Swelling    Did it involve swelling of the face/tongue/throat, SOB, or low BP? Unknown Did it involve sudden or severe rash/hives, skin peeling, or any reaction on the inside of your mouth or nose? Unknown Did you need to seek medical attention at a hospital or doctor's office? Unknown When did it last happen?unknown If all above answers are "NO", may proceed with cephalosporin use.  Marland Kitchen Penicillins Swelling    Did it involve swelling of the face/tongue/throat, SOB, or low BP? Unknown Did it involve sudden or severe rash/hives, skin peeling, or any reaction on the inside of your mouth or nose? Unknown Did you need to seek medical attention at a hospital or doctor's office? Unknown When did it last happen?unknown If all above answers are "NO", may  proceed with cephalosporin use.     Home Medications  Prior to Admission medications   Medication Sig Start Date End Date Taking? Authorizing Provider  acetaminophen (TYLENOL) 325 MG tablet Take 650 mg by mouth every 6 (six) hours as needed for mild pain or headache.   Yes [provider]  aspirin EC 81 MG tablet Take 81 mg by mouth daily.    Yes [provider]  bisacodyl (DULCOLAX) 10 MG suppository Place 10 mg rectally once as needed for moderate constipation.   Yes [provider]  cholecalciferol (VITAMIN D3) 25 MCG (1000 UNIT) tablet Take 1,000 Units by mouth daily.   Yes [provider]  divalproex (DEPAKOTE) 250 MG DR tablet Take 2 tablets (500 mg total) by mouth 2 (two) times daily. Patient taking differently: Take 250 mg by mouth 2 (two) times daily.  09/13/19 09/12/20 Yes Maudie Mercury, MD  feeding supplement, ENSURE, (ENSURE) PUDG Take 1 Container by mouth 2 (two) times daily between meals.   Yes [provider]  guaiFENesin (ROBITUSSIN) 100 MG/5ML liquid Take 200 mg by mouth every 6 (six) hours as needed for cough.   Yes [provider]  lactulose (CHRONULAC) 10 GM/15ML solution Take 10 g by mouth daily.   Yes [provider]  losartan (COZAAR) 25 MG tablet TAKE 1 TABLET(25 MG) BY MOUTH DAILY Patient taking differently: Take 12.5 mg by mouth daily.  09/18/18  Yes Billie Ruddy, MD  magnesium hydroxide (MILK OF MAGNESIA) 400 MG/5ML suspension Take 30 mLs by mouth once as needed for mild constipation.   Yes [provider]  Specialty Vitamins Products (PROSTATE PO) Take 30 mLs by mouth 3 (three) times daily.   Yes [provider]  vitamin B-12 (CYANOCOBALAMIN) 100 MCG tablet Take 100 mcg by mouth daily.   Yes [provider]  levETIRAcetam (KEPPRA) 250 MG tablet Take 1 tablet (250 mg total) by mouth 2 (two) times daily. Patient not taking: Reported on 04/18/2019 03/01/19   Al Decant, MD    Vitamin D, Ergocalciferol, (DRISDOL) 1.25 MG (50000 UNIT) CAPS capsule Take 50,000 Units by mouth every 7 (seven) days. Patient not taking: Reported on 11/17/2019    [provider]     Critical care time: 45 min    Richardson Landry Emaad Nanna ACNP Acute Care Nurse Practitioner [place] Please consult Amion 11/17/2019, 9:25 AM

## 2019-11-17 NOTE — H&P (Addendum)
History and Physical  MADOLIN TWADDLE PZW:[id number] DOB: [birth year] DOA: 11/17/2019  Referring physician: Dr. Roxanne Mins PCP: Patient, No Pcp Per  Outpatient Specialists: None Patient coming from: SNF  Chief Complaint: Shortness of breath  HPI: Nicole Blackwell is a 84 y.o. female with medical history significant for dementia with no behavioral disturbances, essential hypertension, chronic diastolic CHF, seizure disorder on Depakote, oropharyngeal dysphagia, who presented from SNF due to increasing shortness of breath.  The patient has advanced dementia, therefore, history was obtained by EDP, [facility], and medical records.  Unable to reach her daughter via phone, attempted multiple times.  EMS noted rales and rhonchi diffusely and gave her Solu-Medrol, albuterol and Atrovent via nebulizer.  ED Course: T-max 99.2, heart rate 123, respiration rate 34.  Lab studies remarkable for lactic acidosis 8.1, creatinine 1.11 GFR of 54 with baseline creatinine of 0.7 GFR greater than 60.  Also remarkable for leukopenia 2.5K and neutropenia 1.5.  Chest x-ray personally reviewed showing bilateral pulmonary infiltrates worse on the right upper lobe.  TRH asked to admit..  Review of Systems: Review of systems as noted in the HPI. All other systems reviewed and are negative.   Past Medical History:  Diagnosis Date  . BACK PAIN   . Dementia ([place])   . Hypertension   . OSTEOPOROSIS   . PLANTAR FASCIITIS   . Positive PPD   . Seizures ([place])   . SHINGLES   . SYMPTOMATIC MENOPAUSAL/FEMALE CLIMACTERIC STATES   . Syncope and collapse 12/13/2014  . Uterine cancer [facility])    Past Surgical History:  Procedure Laterality Date  . CATARACT EXTRACTION, BILATERAL Bilateral   . DILATION AND CURETTAGE OF UTERUS  1960's X 2   "I lost 2 children to miscarriage"  . LAPAROSCOPIC CHOLECYSTECTOMY    . SIGMOIDOSCOPY    . TONSILLECTOMY    . VAGINAL HYSTERECTOMY      Social History:  reports that she has never smoked. She has never  used smokeless tobacco. She reports that she does not drink alcohol and does not use drugs.   Allergies  Allergen Reactions  . Penicillin G Benzathine Swelling    Did it involve swelling of the face/tongue/throat, SOB, or low BP? Unknown Did it involve sudden or severe rash/hives, skin peeling, or any reaction on the inside of your mouth or nose? Unknown Did you need to seek medical attention at a hospital or doctor's office? Unknown When did it last happen?unknown If all above answers are "NO", may proceed with cephalosporin use.  Marland Kitchen Penicillins Swelling    Did it involve swelling of the face/tongue/throat, SOB, or low BP? Unknown Did it involve sudden or severe rash/hives, skin peeling, or any reaction on the inside of your mouth or nose? Unknown Did you need to seek medical attention at a hospital or doctor's office? Unknown When did it last happen?unknown If all above answers are "NO", may proceed with cephalosporin use.    Family History  Problem Relation Age of Onset  . Healthy Mother   . Healthy Father       Prior to Admission medications   Medication Sig Start Date End Date Taking? Authorizing Provider  aspirin EC 81 MG tablet Take 81 mg by mouth at bedtime.    [provider]  divalproex (DEPAKOTE) 250 MG DR tablet Take 2 tablets (500 mg total) by mouth 2 (two) times daily. 09/13/19 09/12/20  Maudie Mercury, MD  levETIRAcetam (KEPPRA) 250 MG tablet Take 1 tablet (250 mg total) by  mouth 2 (two) times daily. Patient not taking: Reported on 04/18/2019 03/01/19   Al Decant, MD  losartan (COZAAR) 25 MG tablet TAKE 1 TABLET(25 MG) BY MOUTH DAILY Patient taking differently: Take 25 mg by mouth daily.  09/18/18   Billie Ruddy, MD  vitamin B-12 (CYANOCOBALAMIN) 100 MCG tablet Take 100 mcg by mouth daily.    [provider]  Vitamin D, Ergocalciferol, (DRISDOL) 1.25 MG (50000 UNIT) CAPS capsule Take 50,000 Units by mouth every 7 (seven) days.     [provider]    Physical Exam: BP 138/68 (BP Location: Right Arm)   Pulse (!) 114   Temp 98.9 F (37.2 C) (Oral)   Resp (!) 22   Ht 5\' 5"  (1.651 m)   Wt 70 kg   SpO2 100%   BMI 25.68 kg/m   . General: 84 y.o. year-old female well developed well nourished.  Alert and uncomfortable.  Audible ronchorous sounds noted across the room in the ED. . Cardiovascular: Tachycardic with no rubs or gallops.  No thyromegaly.  Bilateral  JVD noted.  Trace lower extremity edema bilaterally . Respiratory: Audible ronchorous sounds.  Requiring suctioning.  Accessory muscle use. . Abdomen: Soft nontender nondistended with normal bowel sounds x4 quadrants. . Muskuloskeletal: No cyanosis, clubbing or edema noted bilaterally . Neuro: CN II-XII intact, strength, sensation, reflexes . Skin: No ulcerative lesions noted or rashes . Psychiatry: Judgement and insight appear altered in the setting of dementia. Mood is appropriate for condition and setting          Labs on Admission:  Basic Metabolic Panel: Recent Labs  Lab 11/17/19 0123  NA 138  K 3.8  CL 97*  CO2 24  GLUCOSE 144*  BUN 23  CREATININE 1.11*  CALCIUM 8.9   Liver Function Tests: Recent Labs  Lab 11/17/19 0123  AST 48*  ALT 26  ALKPHOS 61  BILITOT 1.1  PROT 6.4*  ALBUMIN 2.9*   No results for input(s): LIPASE, AMYLASE in the last 168 hours. No results for input(s): AMMONIA in the last 168 hours. CBC: Recent Labs  Lab 11/17/19 0123  WBC 2.5*  NEUTROABS 1.5*  HGB 14.2  HCT 47.4*  MCV 85.9  PLT 210   Cardiac Enzymes: No results for input(s): CKTOTAL, CKMB, CKMBINDEX, TROPONINI in the last 168 hours.  BNP (last 3 results) Recent Labs    11/17/19 0123  BNP 236.9*    ProBNP (last 3 results) No results for input(s): PROBNP in the last 8760 hours.  CBG: No results for input(s): GLUCAP in the last 168 hours.  Radiological Exams on Admission: DG Chest Portable 1 View  Result Date:  11/17/2019 CLINICAL DATA:  Increasing shortness of breath EXAM: PORTABLE CHEST 1 VIEW COMPARISON:  09/12/2019 FINDINGS: Cardiac shadow is within normal limits. Aortic calcifications are seen. Patchy bilateral airspace opacities are noted worst in the right upper lobe. Given the abrupt onset from the prior exam and likely represent multifocal pneumonia. No sizable effusion is seen. No bony abnormality is noted. IMPRESSION: Bilateral infiltrates worse in the right upper lobe. Followup PA and lateral chest X-ray is recommended in 3-4 weeks following trial of antibiotic therapy to ensure resolution and exclude underlying malignancy. Electronically Signed   By: Inez Catalina M.D.   On: 11/17/2019 01:06    EKG: I independently viewed the EKG done and my findings are as followed: Sinus tachycardia rate of 125 left bundle branch block.  Left arm branch block was present on previous twelve-lead  EKG.  Assessment/Plan Present on Admission: . HCAP (healthcare-associated pneumonia)  Active Problems:   HCAP (healthcare-associated pneumonia)  Sepsis secondary to multifocal HCAP worse on the right upper lobe with suspicion for aspiration, POA Presented with increasing shortness of breath from SNF Leukopenia, WBC 2.5K, lactic acidosis 8.1, respiration rate of 34, chest x-ray consistent with multifocal pneumonia She has a penicillin allergy, was started on IV levofloxacin in the ED Added IV vancomycin and IV Flagyl for broad coverage in the setting of HCAP. History of oropharyngeal dysphagia per [facility]ACE, called to obtain more information since the writer could not reach her daughter. Unable to obtain a history from the patient due to advanced dementia Obtain sputum culture, blood cultures x2 peripherally Obtain MRSA screening Monitor fever curve and WBC Follow cultures Audible rhonchorous, started Mucomyst neb, chest vest, hypersaline neb to help with her secretions.  Intermittent suctioning. Obtain CT chest to  further assess right upper lobe lesion, follow results.  Oropharyngeal dysphagia with concern for possible aspiration Keep n.p.o. until seen by speech therapy Aspiration precautions  AKI Baseline creatinine is to be 0.7 with GFR greater than 60 Presented with creatinine of 1.1 with GFR of 54 Avoid nephrotoxins and hypotension Hold off IV fluids for now due to concern for volume overload in the setting of acute on chronic diastolic CHF Monitor urine output Repeat renal panel in the morning  Acute on chronic diastolic CHF Presented with elevated BNP, bilateral JVD on exam. Received IV fluid in the ED per sepsis protocol Use of accessory muscles to breathe, will give 1 dose of IV Lasix 40 mg x 1 Last 2D echo done on 08/28/2018 revealed LVEF 60-65% Repeat 2D echo on 11/17/2019 Start strict I's and O's and daily weight Losartan on hold due to AKI.  Acute hypoxic respiratory failure secondary to HCAP Not on oxygen supplementation at baseline Currently on 2 L to maintain O2 saturation greater than 92%  Lactic acidosis Presented with lactic acid 8.1 Received IV fluid per sepsis protocol Lactic acid is downtrending, cycle  Sinus tachycardia suspect driven by her current lung physiology. Obtain TSH Treat pneumonia JVD present on exam, give 1 dose of IV Lasix 40 mg x1. Give pulmonary toilet IV Lopressor 2.5 mg 3 times daily as patient is n.p.o. and her home oral medications have been held  Essential hypertension BP is not at goal Losartan held due to AKI IV Lopressor 2.5 mg 3 times daily Continue to monitor vital signs  Isolated elevated AST Monitor Repeat CMP in the morning   Seizure disorder Resume home Depakote 250 mg twice daily, will give intravenously due to n.p.o. Seizure precautions  Vitamin B12 deficiency/vitamin D deficiency Resume home regimen when no longer NPO  Peripheral artery disease Resume aspirin when no longer NPO  Chronic constipation Resume home  regimen when no longer NPO On lactulose 10 g daily and senna 1 tab twice daily.    DVT prophylaxis: Subcu Lovenox daily  Code Status: Full code  Family Communication: None at bedside.  Attempted to reach her daughter multiple times via phone unsuccessfully.  Contacted [facility].  Disposition Plan: Admit to progressive unit.  Consults called: None  Admission status: Inpatient status   Status is: Inpatient    Dispo:  Patient From: [place]  Planned Disposition: [place]  Expected discharge date: 11/20/19  Medically stable for discharge: No, ongoing management of sepsis secondary to multifocal HCAP, acute on chronic diastolic CHF.        Lorenda Cahill  [place] Crane MD [facility] Pager [phone]  If 7PM-7AM, please contact night-coverage [email or web address] Password [facility]  11/17/2019, 3:40 AM

## 2019-11-17 NOTE — ED Triage Notes (Addendum)
Patient arrived with EMS from [facility] , staff reported worsening SOB with chest congestion , rales/crackles and rhonchi on both lung fields . She received Solumedrol 125 mg IV and Albuterol 5/Atrovent 0.5 mg  Nebulizer by EMS .

## 2019-11-17 NOTE — ED Notes (Signed)
PT sats dropped into the 60's.  RT is at her bedside and has suctioned her (nasally), repositioned her and placed her on an nrb.  Her breathing is now very labored at 26, even though we have her sats up to 95%.  Richardson Landry with CCM notified.

## 2019-11-17 NOTE — Progress Notes (Signed)
Pharmacy Antibiotic Note   Nicole Blackwell is a 84 y.o. female admitted on 11/17/2019 with pneumonia and sepsis.  Pharmacy has been consulted for Vancomycin dosing. WBC low. Renal function ok. Lactic acid elevated.   Plan: Vancomycin 1000 mg IV q24h Levaquin/Flagyl per MD Trend WBC, temp, renal function  F/U infectious work-up Drug levels as indicated  Height: 5\' 5"  (165.1 cm) Weight: 70 kg (154 lb 5.2 oz) IBW/kg (Calculated) : 57  Temp (24hrs), Avg:99.3 F (37.4 C), Min:98.9 F (37.2 C), Max:99.8 F (37.7 C)  Recent Labs  Lab 11/17/19 0123 11/17/19 0246  WBC 2.5*  --   CREATININE 1.11*  --   LATICACIDVEN 8.1* 7.1*    Estimated Creatinine Clearance: 38.4 mL/min (A) (by C-G formula based on SCr of 1.11 mg/dL (H)).    Allergies  Allergen Reactions  . Penicillin G Benzathine Swelling    Did it involve swelling of the face/tongue/throat, SOB, or low BP? Unknown Did it involve sudden or severe rash/hives, skin peeling, or any reaction on the inside of your mouth or nose? Unknown Did you need to seek medical attention at a hospital or doctor's office? Unknown When did it last happen?unknown If all above answers are "NO", may proceed with cephalosporin use.  Marland Kitchen Penicillins Swelling    Did it involve swelling of the face/tongue/throat, SOB, or low BP? Unknown Did it involve sudden or severe rash/hives, skin peeling, or any reaction on the inside of your mouth or nose? Unknown Did you need to seek medical attention at a hospital or doctor's office? Unknown When did it last happen?unknown If all above answers are "NO", may proceed with cephalosporin use.    Narda Bonds, PharmD, BCPS Clinical Pharmacist Phone: [phone]

## 2019-11-17 NOTE — ED Notes (Signed)
CCM at bedside 

## 2019-11-17 NOTE — ED Provider Notes (Signed)
[facility]MENT Provider Note   CSN: [id number] Arrival date & time: 11/17/19  0037   History Chief Complaint  Patient presents with  . Chest Congestion/SOB    Nicole Blackwell is a 84 y.o. female.  The history is provided by the nursing home. The history is limited by the condition of the patient (Dementia).  She has history of hypertension, dementia and is sent from her nursing home because of increasing shortness of breath.  Patient is unable to give any history at all.  EMS noted rales and rhonchi diffusely and gave her methylprednisolone, albuterol and Atrovent via nebulizer.  Past Medical History:  Diagnosis Date  . BACK PAIN   . Dementia ([place])   . Hypertension   . OSTEOPOROSIS   . PLANTAR FASCIITIS   . Positive PPD   . Seizures ([place])   . SHINGLES   . SYMPTOMATIC MENOPAUSAL/FEMALE CLIMACTERIC STATES   . Syncope and collapse 12/13/2014  . Uterine cancer [facility])     Patient Active Problem List   Diagnosis Date Noted  . Orthostatic hypotension   . Unsteadiness 02/28/2019  . Suspected cerebrovascular accident (CVA) 02/28/2019  . Acute encephalopathy 02/24/2019  . Lethargy 02/24/2019  . AMS (altered mental status) 12/17/2018  . Fatigue 08/27/2018  . Protein calorie malnutrition ([place]) 05/24/2018  . Hypoalbuminemia 05/23/2018  . Abnormal EEG 05/22/2018  . Dementia ([place]) 05/21/2018  . Fall 05/20/2018  . Rhabdomyolysis 05/20/2018  . Bilateral lower extremity edema 05/20/2018  . Serum total bilirubin elevated 05/20/2018  . B12 deficiency 01/17/2018  . Vascular dementia without behavioral disturbance ([place]) 01/17/2018  . Laceration of scalp without foreign body   . Chronic diastolic CHF (congestive heart failure) ([place]) 12/13/2014  . Syncope 01/12/2012  . BACK PAIN 04/30/2008  . SHINGLES 09/27/2007  . SYMPTOMATIC MENOPAUSAL/FEMALE CLIMACTERIC STATES 06/20/2007  . Osteoporosis 03/11/2007  . PLANTAR FASCIITIS 11/16/2006    Past  Surgical History:  Procedure Laterality Date  . CATARACT EXTRACTION, BILATERAL Bilateral   . DILATION AND CURETTAGE OF UTERUS  1960's X 2   "I lost 2 children to miscarriage"  . LAPAROSCOPIC CHOLECYSTECTOMY    . SIGMOIDOSCOPY    . TONSILLECTOMY    . VAGINAL HYSTERECTOMY       OB History   No obstetric history on file.     Family History  Problem Relation Age of Onset  . Healthy Mother   . Healthy Father     Social History   Tobacco Use  . Smoking status: Never Smoker  . Smokeless tobacco: Never Used  Vaping Use  . Vaping Use: Never used  Substance Use Topics  . Alcohol use: No  . Drug use: No    Home Medications Prior to Admission medications   Medication Sig Start Date End Date Taking? Authorizing Provider  aspirin EC 81 MG tablet Take 81 mg by mouth at bedtime.    [provider]  divalproex (DEPAKOTE) 250 MG DR tablet Take 2 tablets (500 mg total) by mouth 2 (two) times daily. 09/13/19 09/12/20  Maudie Mercury, MD  levETIRAcetam (KEPPRA) 250 MG tablet Take 1 tablet (250 mg total) by mouth 2 (two) times daily. Patient not taking: Reported on 04/18/2019 03/01/19   Al Decant, MD  losartan (COZAAR) 25 MG tablet TAKE 1 TABLET(25 MG) BY MOUTH DAILY Patient taking differently: Take 25 mg by mouth daily.  09/18/18   Billie Ruddy, MD  vitamin B-12 (CYANOCOBALAMIN) 100 MCG tablet Take 100 mcg by mouth daily.  [provider]  Vitamin D, Ergocalciferol, (DRISDOL) 1.25 MG (50000 UNIT) CAPS capsule Take 50,000 Units by mouth every 7 (seven) days.    [provider]    Allergies    Penicillin g benzathine and Penicillins  Review of Systems   Review of Systems  Unable to perform ROS: Dementia    Physical Exam Updated Vital Signs BP (!) 156/102 (BP Location: Right Arm)   Pulse (!) 122   Temp 99.2 F (37.3 C) (Oral)   Resp 20   Ht 5\' 5"  (1.651 m)   Wt 70 kg   SpO2 98%   BMI 25.68 kg/m   Physical Exam Vitals and nursing note  reviewed.   84 year old female, resting comfortably and in no acute distress. Vital signs are significant for elevated heart rate and blood pressure. Oxygen saturation is 98%, which is normal.  She feels warm to the touch. Head is normocephalic and atraumatic. PERRLA, EOMI. Oropharynx is clear. Neck is nontender and supple without adenopathy. JVD is present. Back is nontender and there is no CVA tenderness. Lungs have coarse breath sounds throughout with fine rales and coarse rhonchi diffusely. Chest is nontender. Heart has regular rate and rhythm without murmur. Abdomen is soft, flat, nontender without masses or hepatosplenomegaly and peristalsis is normoactive. Extremities have no cyanosis or edema, full range of motion is present. Skin is warm and dry without rash. Neurologic: Awake but nonverbal and does not follow commands, cranial nerves are grossly intact, moves all extremities equally.   ED Results / Procedures / Treatments   Labs (all labs ordered are listed, but only abnormal results are displayed) Labs Reviewed  LACTIC ACID, PLASMA - Abnormal; Notable for the following components:      Result Value   Lactic Acid, Venous 8.1 (*)    All other components within normal limits  LACTIC ACID, PLASMA - Abnormal; Notable for the following components:   Lactic Acid, Venous 7.1 (*)    All other components within normal limits  COMPREHENSIVE METABOLIC PANEL - Abnormal; Notable for the following components:   Chloride 97 (*)    Glucose, Bld 144 (*)    Creatinine, Ser 1.11 (*)    Total Protein 6.4 (*)    Albumin 2.9 (*)    AST 48 (*)    GFR calc non Af Amer 46 (*)    GFR calc Af Amer 54 (*)    Anion gap 17 (*)    All other components within normal limits  CBC WITH DIFFERENTIAL/PLATELET - Abnormal; Notable for the following components:   WBC 2.5 (*)    RBC 5.52 (*)    HCT 47.4 (*)    MCH 25.7 (*)    Neutro Abs 1.5 (*)    All other components within normal limits  PROTIME-INR -  Abnormal; Notable for the following components:   Prothrombin Time 15.3 (*)    INR 1.3 (*)    All other components within normal limits  URINALYSIS, ROUTINE W REFLEX MICROSCOPIC - Abnormal; Notable for the following components:   APPearance HAZY (*)    Specific Gravity, Urine >1.030 (*)    Hgb urine dipstick MODERATE (*)    Protein, ur 30 (*)    All other components within normal limits  BRAIN NATRIURETIC PEPTIDE - Abnormal; Notable for the following components:   B Natriuretic Peptide 236.9 (*)    All other components within normal limits  URINALYSIS, MICROSCOPIC (REFLEX) - Abnormal; Notable for the following components:  Bacteria, UA RARE (*)    All other components within normal limits  RESPIRATORY PANEL BY RT PCR (FLU A&B, COVID)  URINE CULTURE  CULTURE, BLOOD (ROUTINE X 2)  CULTURE, BLOOD (ROUTINE X 2)  EXPECTORATED SPUTUM ASSESSMENT W REFEX TO RESP CULTURE  MRSA PCR SCREENING  APTT  LACTIC ACID, PLASMA  LACTIC ACID, PLASMA  PROCALCITONIN  TROPONIN I (HIGH SENSITIVITY)  TROPONIN I (HIGH SENSITIVITY)    EKG EKG Interpretation  Date/Time:  Friday November 17 2019 00:42:52 EDT Ventricular Rate:  125 PR Interval:    QRS Duration: 147 QT Interval:  344 QTC Calculation: 497 R Axis:   57 Text Interpretation: Sinus tachycardia Left bundle branch block When compared with ECG of 09/13/2019, HEART RATE has increased Confirmed by Delora Fuel ([id number]) on 11/17/2019 12:46:04 AM   Radiology DG Chest Portable 1 View  Result Date: 11/17/2019 CLINICAL DATA:  Increasing shortness of breath EXAM: PORTABLE CHEST 1 VIEW COMPARISON:  09/12/2019 FINDINGS: Cardiac shadow is within normal limits. Aortic calcifications are seen. Patchy bilateral airspace opacities are noted worst in the right upper lobe. Given the abrupt onset from the prior exam and likely represent multifocal pneumonia. No sizable effusion is seen. No bony abnormality is noted. IMPRESSION: Bilateral infiltrates worse in the  right upper lobe. Followup PA and lateral chest X-ray is recommended in 3-4 weeks following trial of antibiotic therapy to ensure resolution and exclude underlying malignancy. Electronically Signed   By: Inez Catalina M.D.   On: 11/17/2019 01:06    Procedures Procedures  CRITICAL CARE Performed by: Delora Fuel Total critical care time: 60 minutes Critical care time was exclusive of separately billable procedures and treating other patients. Critical care was necessary to treat or prevent imminent or life-threatening deterioration. Critical care was time spent personally by me on the following activities: development of treatment plan with patient and/or surrogate as well as nursing, discussions with consultants, evaluation of patient's response to treatment, examination of patient, obtaining history from patient or surrogate, ordering and performing treatments and interventions, ordering and review of laboratory studies, ordering and review of radiographic studies, pulse oximetry and re-evaluation of patient's condition.  Medications Ordered in ED Medications  lactated ringers infusion ( Intravenous New Bag/Given 11/17/19 0208)  levofloxacin (LEVAQUIN) IVPB 500 mg (has no administration in time range)  metroNIDAZOLE (FLAGYL) IVPB 500 mg (has no administration in time range)  levofloxacin (LEVAQUIN) IVPB 750 mg (0 mg Intravenous Stopped 11/17/19 0256)  lactated ringers bolus 1,000 mL (0 mLs Intravenous Stopped 11/17/19 0243)    And  lactated ringers bolus 1,000 mL (0 mLs Intravenous Stopped 11/17/19 0243)    And  lactated ringers bolus 250 mL (0 mLs Intravenous Stopped 11/17/19 0254)    ED Course  I have reviewed the triage vital signs and the nursing notes.  Pertinent labs & imaging results that were available during my care of the patient were reviewed by me and considered in my medical decision making (see chart for details).  MDM Rules/Calculators/A&P Respiratory distress with several  possible etiologies.  Consider fluid overload and heart failure, although no peripheral edema noted.  Consider pneumonia, although not running a fever.  Will check chest x-ray and screening labs.  In spite of lack of fever, am concerned about possible sepsis and she is started on the possible sepsis pathway.  Old records are reviewed, and she has no relevant past visits.  2:03 AM Chest x-ray seems most consistent with pneumonia with extensive right upper lobe infiltrate.  Lactate  is markedly elevated at 8.1 consistent with sepsis.  WBC is low and with a left shift, consistent with sepsis.  Code sepsis is activated and she is given early, goal-directed fluids as well as IV antibiotics. Case is discussed with Dr. Nevada Crane of [facility] hospitalists, who agrees to admit the patient.  Final Clinical Impression(s) / ED Diagnoses Final diagnoses:  Community acquired pneumonia of right upper lobe of lung  Sepsis due to undetermined organism ([place])  Neutropenia, unspecified type [facility])    Rx / DC Orders ED Discharge Orders    None       Delora Fuel, MD 68/16/61 [phone]

## 2019-11-17 NOTE — Progress Notes (Signed)
  Echocardiogram 2D Echocardiogram has been performed.  Jennette Dubin 11/17/2019, 2:06 PM

## 2019-11-18 ENCOUNTER — Inpatient Hospital Stay (HOSPITAL_COMMUNITY): Payer: Medicare (Managed Care)

## 2019-11-18 DIAGNOSIS — J9601 Acute respiratory failure with hypoxia: Secondary | ICD-10-CM

## 2019-11-18 DIAGNOSIS — I5030 Unspecified diastolic (congestive) heart failure: Secondary | ICD-10-CM

## 2019-11-18 DIAGNOSIS — G40909 Epilepsy, unspecified, not intractable, without status epilepticus: Secondary | ICD-10-CM

## 2019-11-18 DIAGNOSIS — I110 Hypertensive heart disease with heart failure: Secondary | ICD-10-CM

## 2019-11-18 DIAGNOSIS — F0150 Vascular dementia without behavioral disturbance: Secondary | ICD-10-CM

## 2019-11-18 DIAGNOSIS — R000 Tachycardia, unspecified: Secondary | ICD-10-CM

## 2019-11-18 LAB — CBC WITH DIFFERENTIAL/PLATELET
Abs Immature Granulocytes: 0 10*3/uL (ref 0.00–0.07)
Band Neutrophils: 0 %
Basophils Absolute: 0 10*3/uL (ref 0.0–0.1)
Basophils Relative: 0 %
Blasts: 0 %
Eosinophils Absolute: 0 10*3/uL (ref 0.0–0.5)
Eosinophils Relative: 0 %
HCT: 39.4 % (ref 36.0–46.0)
Hemoglobin: 12.2 g/dL (ref 12.0–15.0)
Lymphocytes Relative: 28 %
Lymphs Abs: 1.9 10*3/uL (ref 0.7–4.0)
MCH: 26 pg (ref 26.0–34.0)
MCHC: 31 g/dL (ref 30.0–36.0)
MCV: 83.8 fL (ref 80.0–100.0)
Metamyelocytes Relative: 0 %
Monocytes Absolute: 0.5 10*3/uL (ref 0.1–1.0)
Monocytes Relative: 7 %
Myelocytes: 0 %
Neutro Abs: 4.5 10*3/uL (ref 1.7–7.7)
Neutrophils Relative %: 65 %
Other: 0 %
Platelets: 162 10*3/uL (ref 150–400)
Promyelocytes Relative: 0 %
RBC: 4.7 MIL/uL (ref 3.87–5.11)
RDW: 14.6 % (ref 11.5–15.5)
WBC: 6.9 10*3/uL (ref 4.0–10.5)
nRBC: 0 % (ref 0.0–0.2)
nRBC: 0 /100 WBC

## 2019-11-18 LAB — COMPREHENSIVE METABOLIC PANEL
ALT: 21 U/L (ref 0–44)
AST: 34 U/L (ref 15–41)
Albumin: 2.2 g/dL — ABNORMAL LOW (ref 3.5–5.0)
Alkaline Phosphatase: 46 U/L (ref 38–126)
Anion gap: 11 (ref 5–15)
BUN: 16 mg/dL (ref 8–23)
CO2: 29 mmol/L (ref 22–32)
Calcium: 8.1 mg/dL — ABNORMAL LOW (ref 8.9–10.3)
Chloride: 98 mmol/L (ref 98–111)
Creatinine, Ser: 0.85 mg/dL (ref 0.44–1.00)
GFR calc Af Amer: >60 mL/min (ref >60)
GFR calc non Af Amer: >60 mL/min (ref >60)
Glucose, Bld: 94 mg/dL (ref 70–99)
Potassium: 3.4 mmol/L — ABNORMAL LOW (ref 3.5–5.1)
Sodium: 138 mmol/L (ref 135–145)
Total Bilirubin: 0.6 mg/dL (ref 0.3–1.2)
Total Protein: 4.8 g/dL — ABNORMAL LOW (ref 6.5–8.1)

## 2019-11-18 LAB — URINE CULTURE: Culture: NO GROWTH

## 2019-11-18 LAB — GLUCOSE, CAPILLARY
Glucose-Capillary: 101 mg/dL — ABNORMAL HIGH (ref 70–99)
Glucose-Capillary: 111 mg/dL — ABNORMAL HIGH (ref 70–99)
Glucose-Capillary: 107 mg/dL — ABNORMAL HIGH (ref 70–99)

## 2019-11-18 LAB — PROCALCITONIN: Procalcitonin: 25.08 ng/mL

## 2019-11-18 LAB — LACTIC ACID, PLASMA: Lactic Acid, Venous: 3.1 mmol/L (ref 0.5–1.9)

## 2019-11-18 LAB — MAGNESIUM: Magnesium: 1.7 mg/dL (ref 1.7–2.4)

## 2019-11-18 LAB — PHOSPHORUS: Phosphorus: 1.9 mg/dL — ABNORMAL LOW (ref 2.5–4.6)

## 2019-11-18 MED ORDER — SODIUM CHLORIDE 0.9 % IV SOLN
2.0000 g | Freq: Three times a day (TID) | INTRAVENOUS | Status: DC
  Administered 2019-11-18 – 2019-11-19 (×3): 2 g via INTRAVENOUS
  Filled 2019-11-18 (×3): qty 2

## 2019-11-18 MED ORDER — FUROSEMIDE 10 MG/ML IJ SOLN
40.0000 mg | Freq: Once | INTRAMUSCULAR | Status: AC
  Administered 2019-11-18: 40 mg via INTRAVENOUS
  Filled 2019-11-18: qty 4

## 2019-11-18 MED ORDER — MAGNESIUM SULFATE 2 GM/50ML IV SOLN
2.0000 g | Freq: Once | INTRAVENOUS | Status: AC
  Administered 2019-11-18: 2 g via INTRAVENOUS
  Filled 2019-11-18: qty 50

## 2019-11-18 MED ORDER — POTASSIUM PHOSPHATES 15 MMOLE/5ML IV SOLN
30.0000 mmol | Freq: Once | INTRAVENOUS | Status: AC
  Administered 2019-11-18: 30 mmol via INTRAVENOUS
  Filled 2019-11-18: qty 10

## 2019-11-18 MED ORDER — POTASSIUM CHLORIDE 20 MEQ PO PACK
40.0000 meq | PACK | Freq: Two times a day (BID) | ORAL | Status: DC
  Administered 2019-11-18: 40 meq via ORAL
  Filled 2019-11-18: qty 2

## 2019-11-18 MED ORDER — PANTOPRAZOLE SODIUM 40 MG IV SOLR
40.0000 mg | INTRAVENOUS | Status: DC
  Administered 2019-11-18 – 2019-11-21 (×4): 40 mg via INTRAVENOUS
  Filled 2019-11-18 (×4): qty 40

## 2019-11-18 MED ORDER — POTASSIUM CHLORIDE CRYS ER 20 MEQ PO TBCR: 30.0000 meq | EXTENDED_RELEASE_TABLET | Freq: Two times a day (BID) | ORAL | Status: DC

## 2019-11-18 NOTE — Evaluation (Signed)
Clinical/Bedside Swallow Evaluation Patient Details  Name: Nicole Blackwell MRN: [id number] Date of Birth: August 04, 1935  Today's Date: 11/18/2019 Time: SLP Start Time (ACUTE ONLY): 0756 SLP Stop Time (ACUTE ONLY): 0810 SLP Time Calculation (min) (ACUTE ONLY): 14 min  Past Medical History:  Past Medical History:  Diagnosis Date  . BACK PAIN   . Dementia ([place])   . Hypertension   . OSTEOPOROSIS   . PLANTAR FASCIITIS   . Positive PPD   . Seizures ([place])   . SHINGLES   . SYMPTOMATIC MENOPAUSAL/FEMALE CLIMACTERIC STATES   . Syncope and collapse 12/13/2014  . Uterine cancer [facility])    Past Surgical History:  Past Surgical History:  Procedure Laterality Date  . CATARACT EXTRACTION, BILATERAL Bilateral   . DILATION AND CURETTAGE OF UTERUS  1960's X 2   "I lost 2 children to miscarriage"  . LAPAROSCOPIC CHOLECYSTECTOMY    . SIGMOIDOSCOPY    . TONSILLECTOMY    . VAGINAL HYSTERECTOMY     HPI:  Nicole Blackwell is a 84 y.o. female with medical history significant for dementia with no behavioral disturbances, essential hypertension, chronic diastolic CHF, seizure disorder on Depakote, oropharyngeal dysphagia, who presented from SNF due to increasing shortness of breath.  Chest CT on 9/24 reported: "Extensive multifocal pulmonary consolidation in keeping with multifocal pneumonia in the appropriate clinical setting."   Assessment / Plan / Recommendation Clinical Impression  Pt was seen for a bedside swallow evaluation in the setting of suspected aspiration PNA.  Pt has a hx of dementia and she was encountered awake/alert, pleasant, and mildly confused.  She was oriented to herself but not to the place or city.  RN reported that pt had bloody sputum at baseline secondary to PNA, but no cough was observed prior to PO trials during this evaluation.  Oral mechanism examination was limited secondary to pt's difficulty following commands, but oral motor movements appeared to be functional for PO consumption.  Pt  consumed trials of ice chips, thin liquid (tsp/straw) and puree.  She exhibited good bolus acceptance with all trials and she achieved labial closure around the spoon and straw independently.  Suspect timely AP transport, but mildly delayed swallow initiation with all trials.  Congested expirations were observed following thin liquid trials and a delayed cough was observed following 1/2 trials of puree.  Due to MD concerns for aspiration PNA, recommend an instrumental swallow study prior to initiating a PO diet.  Recommend that pt remain NPO with frequent oral care until MBS is completed (hopefully later today pending radiology department schedule).  SLP will f/u per POC.    SLP Visit Diagnosis: Dysphagia, unspecified (R13.10)    Aspiration Risk  Moderate aspiration risk    Diet Recommendation NPO   Medication Administration: Via alternative means    Other  Recommendations Oral Care Recommendations: Oral care QID;Staff/trained caregiver to provide oral care   Follow up Recommendations Skilled Nursing facility      Frequency and Duration min 2x/week  2 weeks       Prognosis Prognosis for Safe Diet Advancement: Good Barriers to Reach Goals: Cognitive deficits      Swallow Study   General HPI: Nicole Blackwell is a 84 y.o. female with medical history significant for dementia with no behavioral disturbances, essential hypertension, chronic diastolic CHF, seizure disorder on Depakote, oropharyngeal dysphagia, who presented from SNF due to increasing shortness of breath.  Type of Study: Bedside Swallow Evaluation Previous Swallow Assessment: BSE 12/18/18 with recs for regular  solids and thin liquid  Diet Prior to this Study: NPO Temperature Spikes Noted: No Respiratory Status: Nasal cannula History of Recent Intubation: No Behavior/Cognition: Alert;Cooperative;Confused;Pleasant mood Oral Cavity Assessment: Within Functional Limits Oral Care Completed by SLP: No Oral Cavity - Dentition:  Adequate natural dentition Vision: Functional for self-feeding Self-Feeding Abilities: Needs assist Patient Positioning: Upright in bed Baseline Vocal Quality: Normal Volitional Cough: Congested Volitional Swallow: Able to elicit    Oral/Motor/Sensory Function Overall Oral Motor/Sensory Function: Within functional limits (per limited evaluation, pt with difficulty following command)   Ice Chips Ice chips: Within functional limits Presentation: Spoon   Thin Liquid Thin Liquid: Impaired Presentation: Spoon;Straw Pharyngeal  Phase Impairments: Wet Vocal Quality    Nectar Thick Nectar Thick Liquid: Not tested   Honey Thick Honey Thick Liquid: Not tested   Puree Puree: Impaired Presentation: Spoon Pharyngeal Phase Impairments: Cough - Delayed   Solid     Solid: Not tested     Nicole Blackwell M.S., CCC-SLP Acute Rehabilitation Services Office: [phone]  Nicole Blackwell 11/18/2019,8:26 AM

## 2019-11-18 NOTE — Progress Notes (Signed)
Pharmacy Antibiotic Note   Nicole Blackwell is a 84 y.o. female admitted on 11/17/2019 with pneumonia and sepsis.  Pharmacy has been consulted for Vancomycin dosing. WBC low. Renal function improving. Lactic acid elevated trending down. Will switch levaquin to cefepime after discussion with CCM team. Has a PCN allergy, but patient has tolerated cefepime in the past.  Plan: Stop Levaquin Start cefepime 2g IV q8 hrs Continue Vancomycin 1000 mg IV q24h Flagyl per MD Trend WBC, temp, renal function  Drug levels as indicated  Height: 5\' 5"  (165.1 cm) Weight: 70 kg (154 lb 5.2 oz) IBW/kg (Calculated) : 57  Temp (24hrs), Avg:98.5 F (36.9 C), Min:98.3 F (36.8 C), Max:98.5 F (36.9 C)  Recent Labs  Lab 11/17/19 0123 11/17/19 0246 11/17/19 0600 11/17/19 1825 11/18/19 0144  WBC 2.5*  --   --   --  6.9  CREATININE 1.11*  --   --   --  0.85  LATICACIDVEN 8.1* 7.1* 6.3* 3.9* 3.1*    Estimated Creatinine Clearance: 50.1 mL/min (by C-G formula based on SCr of 0.85 mg/dL).    Allergies  Allergen Reactions  . Penicillin G Benzathine Swelling    Did it involve swelling of the face/tongue/throat, SOB, or low BP? Unknown Did it involve sudden or severe rash/hives, skin peeling, or any reaction on the inside of your mouth or nose? Unknown Did you need to seek medical attention at a hospital or doctor's office? Unknown When did it last happen?unknown If all above answers are "NO", may proceed with cephalosporin use.  Marland Kitchen Penicillins Swelling    Did it involve swelling of the face/tongue/throat, SOB, or low BP? Unknown Did it involve sudden or severe rash/hives, skin peeling, or any reaction on the inside of your mouth or nose? Unknown Did you need to seek medical attention at a hospital or doctor's office? Unknown When did it last happen?unknown If all above answers are "NO", may proceed with cephalosporin use.    Richardine Service, PharmD PGY2 Cardiology Pharmacy Resident Phone:  [phone] 11/18/2019  12:22 PM  Please check [email or web address] for unit-specific pharmacy phone numbers.

## 2019-11-18 NOTE — Progress Notes (Signed)
Hospital course [place]: 84 year old female, with past medical history significant for dementia, HTN, diastolic heart failure, seizure disorder (on Depakote) oropharyngeal dysphagia, who presented from skilled nursing facility for worsening of shortness of breath, hypoxia and fever and found to have sepsis secondary to multifocal pneumonia likely secondary to aspiration. Patient started on IV fluids, IV antibiotic and then was admitted in ICU due to respiratory distress.  Clinically improved and she remains stable on 4 L nasal cannula and transferred to floor today.  IMTS take over the care now. (Patient initially admitted by [facility] hospital[facility] but transferred to our service today as she is [facility])  Subjective: Patient was seen and evaluated at bedside.  She states that she is doing well.  She knows that she is in hospital but does not know the hospital name.  Also not oriented to the year.  She states that she lives in [place] but does not know where and with who.  She states that her breathing is okay and better than before.  Denies any chest pain.  Objective:  Vital signs in last 24 hours: Vitals:   11/18/19 1000 11/18/19 1100 11/18/19 1200 11/18/19 1222  BP: 126/64 (!) 149/81 (!) 154/81   Pulse: 88 (!) 110 (!) 104   Resp: 18 (!) 23 (!) 26   Temp:    (!) 97.5 F (36.4 C)  TempSrc:      SpO2: 100% 98% 99%   Weight:      Height:       Constitutional: tachypnea, no distress.   HENT:  Head: Normocephalic and atraumatic.  Eyes: Conjunctivae are normal, EOM nl Cardiovascular: tachycardic, regular rhythm, nl S1S2, no murmur, 1+ LEE up to ankles Respiratory: diffuse crackles, rhonchi, GI: Soft. Bowel sounds are normal. No distension. There is no tenderness.  Neurological: Is alert and oriented to self, city Skin: Not diaphoretic. No erythema.  Psychiatric: Normal mood and affect. Behavior is normal. Judgment and thought content normal.   Assessment/Plan:  Active  Problems:   HCAP (healthcare-associated pneumonia)  84 year old female, with past medical history significant for dementia, HTN, diastolic heart failure, seizure disorder (on Depakote) oropharyngeal dysphagia, who presented from skilled nursing facility for worsening of shortness of breath, hypoxia at 80s on room air and and temperature of 100.8 at SNF.  She was tachycardic, tachypneic with leukopenia and extremely elevated lactic acid: 8.1 and procalcitonin of 18.56 on arrival.  Chest x-ray suggestive of multifocal pneumonia, R>L likely secondary to aspiration.  Patient started on IV fluids, IV antibiotic for sepsis secondary to HCAP, however due to acute hypoxic respiratory distress, PCCM was consulted and patient admitted in ICU.  She did not require intubation..  She remains on 4 L nasal cannula and transferred to floor today.    Sepsis 2/2 multifocal PNA (Hcap) R>L, likley 2/2 aspiration On arrival: Had leukopenia, WBC 2.5K, lactic acidosis 8.1, respiration rate of 34, chest x-ray consistent with multifocal pneumonia. WBC today 6.9  -Received IV vancomycin, levofloxacin and metronidazole x 1 day.  Now on cefepime day -MRSA negative> holding vanc -Procalcitonin 25 -Discontinue metronidazole -F/u urine legionella ag -Covid and RVP negative. -Continue cefepime. (Started 9/25). -Supplemental oxygen to keep O2 saturation more than 92% -Sputum culture sent to lab but not acceptable for testing -Respiratory therapy -Follow-up blood culture (11/17/2019).  (No growth to date) -Follow-up urine culture 11/17/2019 (no growth to date) -Is on albuterol nebulizer every 6 hours -K 3.1, Phos 1.9. pt is getting K phos -trend LA -  Will re image if decompensate, to evaluate for para pneumonia effucion -Ph/e and is suggestive of pul edema. Dc fluid and giving lasix   Acute hypoxic resp failure: In setting of H cap, sepsis and volume overload after IV hydration. Did not require intubation. Now on 4 li with  no respiratory distress but appears volume up and has diffuse rhonchi and crackles.  Will take benefit of diuresis and monitoring toileting/respiratory therapy in addition to antibiotic therapy for HCAP.  Tachycardia: No chest pain EKG w old LBBB  Dyspgahia: Apparently, this has been a chronic problem. Modified barium swallow study performed today 9/25, recommended dysphagia 2 diet with medications administered whole in pure.  Mild aspiration risk.  -Please follow-up diet recommendation per swallow study  CHF Volume overload 2/2 hydration therpy on arrival for sepisis. -Patient has diffuse rhonchi/crackles on exam and sounds to be volume overload.  Will take benefit of diuretics today. -IV Lasix 40 mg once -dc fluid BMP Latest Ref Rng & Units 11/18/2019 11/17/2019 11/17/2019  Glucose 70 - 99 mg/dL 94 - 144(H)  BUN 8 - 23 mg/dL 16 - 23  Creatinine 0.44 - 1.00 mg/dL 0.85 - 1.11(H)  Sodium 135 - 145 mmol/L 138 137 138  Potassium 3.5 - 5.1 mmol/L 3.4(L) 3.1(L) 3.8  Chloride 98 - 111 mmol/L 98 - 97(L)  CO2 22 - 32 mmol/L 29 - 24  Calcium 8.9 - 10.3 mg/dL 8.1(L) - 8.9    Prior to Admission Living Arrangement:snf Anticipated Discharge Location: snf Barriers to Discharge: Ongoing Tx for extensive multifocal PNA Dispo: Anticipated discharge in approximately 5-6 day(s).   Dewayne Hatch, MD 11/18/2019, 12:31 PM Pager: [phone] After 5pm on weekdays and 1pm on weekends: On Call pager [phone]

## 2019-11-18 NOTE — Progress Notes (Signed)
Transfer Note:   Traveling Method: Bed Transferring Unit: 2H Mental Orientation: A&O X2 Telemetry: 84M10 Assessment: See Flowsheets Skin: WDL  IV: WDL Pain: Denies Safety Measures: Safety Fall Prevention Plan has been given, discussed and signed Admission: Completed 84M Orientation: Patient has been orientated to the room, unit and staff.   Orders have been reviewed and implemented. Will continue to monitor the patient. Call light has been placed within reach and bed alarm has been activated.   Aneta Mins BSN, RN3

## 2019-11-18 NOTE — Progress Notes (Signed)
Modified Barium Swallow Progress Note  Patient Details  Name: Nicole Blackwell MRN: [id number] Date of Birth: [birth year]  Today's Date: 11/18/2019  Modified Barium Swallow completed.  Full report located under Chart Review in the Imaging Section.  Brief recommendations include the following:  Clinical Impression  Pt was seen for a modified barium swallow study and she presents with mild oropharyngeal dysphagia c/b trace, transient laryngeal penetration of thin liquid x1 on today's examination.    Pt consumed trials of thin liquid, puree, and regular solids.  Mastication of regular solids was prolonged with reduced lingual strength observed, resulting in mild oral residue.  Pharyngeal phase was remarkable for reduced BOT retraction resulting in mild-severe residue in the valleculae, reduced hyolaryngeal elevation/excursion resulting in valleculae and pyriform residue, and reduced pharyngeal constriction resulting in mild posterior pharyngeal wall residue.  Pharyngeal residue was observed to increase as viscosity of the bolus increased with the most residue occurring with regular solids.  Pt was unable to follow commands to complete a second dry swallow, but she effectively cleared residue with a liquid wash.  Pharyngoesophageal phase was remarkable for suspected reduced duration of UES opening resulting in esophageal residue around the level of the UES.     Recommend initiation of Dysphagia 2 (fine chop) solids and thin liquids with medications administered whole in puree (cut/crush large pills).  Additionally recommend strict adherence to the following compensatory strategies: 1) Small bites/sips 2) Alternative bites/sips 3) Sit upright as possible 4) Slow rate of intake.  Pt will benefit from set up and intermittent supervision during meals to cue for compensatory strategies (full supervision/assistance if pt is unable to feed herself independently).  Spoke with pt and RNs regarding recommendations.   SLP will briefly f/u to monitor diet tolerance.     Swallow Evaluation Recommendations       SLP Diet Recommendations: Dysphagia 2 (Fine chop) solids;Thin liquid   Liquid Administration via: Cup;Straw   Medication Administration: Whole meds with puree (cut/crush large pills )   Supervision: Intermittent supervision to cue for compensatory strategies;Staff to assist with self feeding   Compensations: Minimize environmental distractions;Slow rate;Small sips/bites   Postural Changes: Remain semi-upright after after feeds/meals (Comment);Seated upright at 90 degrees   Oral Care Recommendations: Oral care BID;Staff/trained caregiver to provide oral care       Nicole Blackwell., M.S., [place] Office: [phone]  Nicole Blackwell Nicole Blackwell 11/18/2019,9:39 AM

## 2019-11-18 NOTE — Progress Notes (Signed)
Patient seen.  On minimal O2.  Eating, talking, pleasantly confused. BP on higher side.  PCCM has signed off but there is some confusion on who is primary, IMTS has reached out and agreed to take over today.  Abx changed to cefepime from levaquin as she has tolerated cefepime in past.   5-7 days reasonable for aspiraiton pneumonitis vs. Cap. Pct elevated.  She should be fine for PT/OT and transfer to tele. K/Mg/Phos repleted.  Renal function fine.  MBSS noted and dysphagia diet in.  No charge Reach out to me with epic messenger if any questions or concerns.  Erskine Emery MD PCCM

## 2019-11-19 LAB — CBC WITH DIFFERENTIAL/PLATELET
Abs Immature Granulocytes: 0.04 10*3/uL (ref 0.00–0.07)
Basophils Absolute: 0 10*3/uL (ref 0.0–0.1)
Basophils Relative: 0 %
Eosinophils Absolute: 0 10*3/uL (ref 0.0–0.5)
Eosinophils Relative: 0 %
HCT: 41.4 % (ref 36.0–46.0)
Hemoglobin: 13 g/dL (ref 12.0–15.0)
Immature Granulocytes: 1 %
Lymphocytes Relative: 17 %
Lymphs Abs: 1.5 10*3/uL (ref 0.7–4.0)
MCH: 25.9 pg — ABNORMAL LOW (ref 26.0–34.0)
MCHC: 31.4 g/dL (ref 30.0–36.0)
MCV: 82.6 fL (ref 80.0–100.0)
Monocytes Absolute: 0.5 10*3/uL (ref 0.1–1.0)
Monocytes Relative: 6 %
Neutro Abs: 6.5 10*3/uL (ref 1.7–7.7)
Neutrophils Relative %: 76 %
Platelets: 194 10*3/uL (ref 150–400)
RBC: 5.01 MIL/uL (ref 3.87–5.11)
RDW: 14.5 % (ref 11.5–15.5)
WBC: 8.6 10*3/uL (ref 4.0–10.5)
nRBC: 0 % (ref 0.0–0.2)

## 2019-11-19 LAB — COMPREHENSIVE METABOLIC PANEL
ALT: 20 U/L (ref 0–44)
AST: 26 U/L (ref 15–41)
Albumin: 2.4 g/dL — ABNORMAL LOW (ref 3.5–5.0)
Alkaline Phosphatase: 49 U/L (ref 38–126)
Anion gap: 9 (ref 5–15)
BUN: 12 mg/dL (ref 8–23)
CO2: 30 mmol/L (ref 22–32)
Calcium: 8.3 mg/dL — ABNORMAL LOW (ref 8.9–10.3)
Chloride: 100 mmol/L (ref 98–111)
Creatinine, Ser: 0.79 mg/dL (ref 0.44–1.00)
GFR calc Af Amer: >60 mL/min (ref >60)
GFR calc non Af Amer: >60 mL/min (ref >60)
Glucose, Bld: 101 mg/dL — ABNORMAL HIGH (ref 70–99)
Potassium: 2.8 mmol/L — ABNORMAL LOW (ref 3.5–5.1)
Sodium: 139 mmol/L (ref 135–145)
Total Bilirubin: 0.9 mg/dL (ref 0.3–1.2)
Total Protein: 5.9 g/dL — ABNORMAL LOW (ref 6.5–8.1)

## 2019-11-19 LAB — BASIC METABOLIC PANEL
Anion gap: 12 (ref 5–15)
BUN: 8 mg/dL (ref 8–23)
CO2: 29 mmol/L (ref 22–32)
Calcium: 8.6 mg/dL — ABNORMAL LOW (ref 8.9–10.3)
Chloride: 96 mmol/L — ABNORMAL LOW (ref 98–111)
Creatinine, Ser: 0.79 mg/dL (ref 0.44–1.00)
GFR calc Af Amer: >60 mL/min (ref >60)
GFR calc non Af Amer: >60 mL/min (ref >60)
Glucose, Bld: 120 mg/dL — ABNORMAL HIGH (ref 70–99)
Potassium: 3.7 mmol/L (ref 3.5–5.1)
Sodium: 137 mmol/L (ref 135–145)

## 2019-11-19 LAB — MAGNESIUM: Magnesium: 1.9 mg/dL (ref 1.7–2.4)

## 2019-11-19 LAB — BLOOD GAS, VENOUS
Acid-Base Excess: 9 mmol/L — ABNORMAL HIGH (ref 0.0–2.0)
Bicarbonate: 32.5 mmol/L — ABNORMAL HIGH (ref 20.0–28.0)
FIO2: 21
O2 Saturation: 72.5 %
Patient temperature: 37.1
pCO2, Ven: 40.2 mmHg — ABNORMAL LOW (ref 44.0–60.0)
pH, Ven: 7.518 — ABNORMAL HIGH (ref 7.250–7.430)
pO2, Ven: <31 mmHg — CL (ref 32.0–45.0)

## 2019-11-19 LAB — TROPONIN I (HIGH SENSITIVITY)
Troponin I (High Sensitivity): 164 ng/L (ref <18)
Troponin I (High Sensitivity): 122 ng/L (ref <18)
Troponin I (High Sensitivity): 142 ng/L (ref <18)

## 2019-11-19 LAB — LEGIONELLA PNEUMOPHILA SEROGP 1 UR AG: L. pneumophila Serogp 1 Ur Ag: NEGATIVE

## 2019-11-19 LAB — PHOSPHORUS: Phosphorus: 2.2 mg/dL — ABNORMAL LOW (ref 2.5–4.6)

## 2019-11-19 MED ORDER — SODIUM CHLORIDE 0.9 % IV SOLN: 2.0000 g | Freq: Two times a day (BID) | INTRAVENOUS | Status: DC

## 2019-11-19 MED ORDER — FUROSEMIDE 10 MG/ML IJ SOLN: 60.0000 mg | Freq: Once | INTRAMUSCULAR | Status: DC

## 2019-11-19 MED ORDER — SODIUM CHLORIDE 0.9 % IV SOLN
2.0000 g | Freq: Three times a day (TID) | INTRAVENOUS | Status: DC
  Administered 2019-11-19 – 2019-11-20 (×3): 2 g via INTRAVENOUS
  Filled 2019-11-19 (×3): qty 2

## 2019-11-19 MED ORDER — POTASSIUM CHLORIDE CRYS ER 20 MEQ PO TBCR
40.0000 meq | EXTENDED_RELEASE_TABLET | Freq: Once | ORAL | Status: AC
  Administered 2019-11-19: 40 meq via ORAL
  Filled 2019-11-19: qty 2

## 2019-11-19 MED ORDER — POTASSIUM PHOSPHATES 15 MMOLE/5ML IV SOLN
30.0000 mmol | Freq: Once | INTRAVENOUS | Status: AC
  Administered 2019-11-19: 30 mmol via INTRAVENOUS
  Filled 2019-11-19: qty 10

## 2019-11-19 MED ORDER — POTASSIUM CHLORIDE 10 MEQ/100ML IV SOLN
10.0000 meq | INTRAVENOUS | Status: AC
  Administered 2019-11-19 (×4): 10 meq via INTRAVENOUS
  Filled 2019-11-19 (×4): qty 100

## 2019-11-19 MED ORDER — FUROSEMIDE 10 MG/ML IJ SOLN
40.0000 mg | Freq: Once | INTRAMUSCULAR | Status: AC
  Administered 2019-11-19: 40 mg via INTRAVENOUS
  Filled 2019-11-19: qty 4

## 2019-11-19 NOTE — Progress Notes (Signed)
Subjective: Patient was seen and evaluated at bedside.  Still has SOB but it is better. No complaint. She is verbal and tries to communicate but can not name the building and when I ask her where is this place, she answers "this is the building where people live in".  I tried to encourage her to use flutter valve but she does not want to try it now.  Objective:  Vital signs in last 24 hours: Vitals:   11/18/19 1000 11/18/19 1100 11/18/19 1200 11/18/19 1222  BP: 126/64 (!) 149/81 (!) 154/81   Pulse: 88 (!) 110 (!) 104   Resp: 18 (!) 23 (!) 26   Temp:    (!) 97.5 F (36.4 C)  TempSrc:      SpO2: 100% 98% 99%   Weight:      Height:        Constitutional: Tachypneic but not in distress.  She coughed a few times during physical exam. Cardiovascular: tachycardic, regular rhythm, no murmur, 1+ LEE up to ankles, JVP improved but still mildly elevated. Respiratory: Diffuse crackles, rhonchi all over lungs R>L. GI: Soft. No distenstion and no tenderness to palpation. Neurological: Is alert and oriented to self, city Skin: Warm  Assessment/Plan:  Active Problems:   HCAP (healthcare-associated pneumonia)  84 year old female, with past medical history significant for dementia, HTN, diastolic heart failure, seizure disorder (on Depakote) oropharyngeal dysphagia, who presented from skilled nursing facility for worsening of shortness of breath, hypoxia at 80s on room air and and temperature of 100.8 at SNF.  She was tachycardic, tachypneic with leukopenia and extremely elevated lactic acid: 8.1 and procalcitonin of 18.56 on arrival.  Chest x-ray suggestive of multifocal pneumonia, R>L likely secondary to aspiration.  Patient started on IV fluids, IV antibiotic for sepsis secondary to HCAP, however due to acute hypoxic respiratory distress, PCCM was consulted and patient admitted in ICU.  She did not require intubation..  She remains on 4 L nasal cannula and transferred to floor today.    Sepsis  2/2 multifocal PNA R>L (could be aspiration, however multifocal pattern is more than aspiration only PNA) Acute hypoxic resp failure: In setting of, sepsis and volume overload after IV hydration. Did not require intubation.  On arrival: Had leukopenia, WBC 2.5K, lactic acidosis 8.1, respiration rate of 34, chest x-ray consistent with multifocal pneumonia. Received IV vancomycin, levofloxacin and metronidazole x 1 day.  Now on cefepime started 9/26. Stopped vanc given MRSA is -. Legionella serotype 1 urine antigen is negative Blood culture negative for 2 days. WBC 8.6 today. -Now on room air (was on nonrebreather> high flow nasal cannula> 4 L of nasal cannula) -Continue cefepime. (Started 9/25). -Supplemental oxygen to keep O2 saturation more than 92% -Sputum culture sent to lab but not acceptable for testing -Respiratory therapy and flutter valve -Follow-up blood culture (11/17/2019).  (No growth to date) -Follow-up urine culture (11/17/2019). (no growth to date) -Continue albuterol nebulizer every 6 hours -Hypokalemia of 2.9, replacing with IV potassium 40 mEq and p.o. potassium 40 mEq.  Magnesium normal at 1.9. -Phosphorus low at 2.2.  Giving IV potassium Phos 30.  We will add more if needed. -Will re image if decompensate, to evaluate for para pneumonia effucion -She had volume overload yesterday, IV fluids stopped and she received 40 mg of IV Lasix.  Her bleeding and volume status seems slightly better today.  I hold off of further IV fluid this a.m. also normal Lasix has she looks to have a balanced volume  status.  -To reassess volume and clinical status to decide about fluid management.   Dyspgahia: Apparently, this has been a chronic problem. Modified barium swallow study performed today 9/25, recommended dysphagia 2 diet with medications administered whole in pure.  Mild aspiration risk.  -Please follow-up diet recommendation per swallow study  Tachycardia: Elevated troponin  days ago No chest pain EKG yesterday w old LBBB Repeated EKG today that showed sinus tach with bigeminy PVC.  Patient is asymptomatic she has low potassium and Phos.  Replacing electrolyte. Repeated troponin today.  Down trended to 164 today from 285 2 days ago. Tachycardia can be in setting of sepsis and frequent beta agonist use for respiratory wheezing.  No further intervention at this point and will monitor.  HFpEF: Echo 11/17/2019 showed EF of 60-65%, right ventricular pressure and volume overload.  Without regional wall motion abnormalities. Prior echo on 08/28/2018 with EF of 60-65% and moderate concentric LV hypertrophy.  Volume overload 2/2 hydration therpy on arrival for sepisis. IV fluids stopped yesterday and she received 40 mg IV Lasix.  I/O's shows net negative of 1156.  Creatinine stable at 0.79.  Volume status improved.   -Monitor volume status closely -Strict INO's  Seizure disorder: On IV valproate May resume home p.o. meds since she is p.o.  Now.  Diet: dysphagia 2 IV fluid: none VTE ppx: Lovenox Code status: full  Prior to Admission Living Arrangement:snf Anticipated Discharge Location: snf Barriers to Discharge: Ongoing Tx for extensive multifocal PNA Dispo: Anticipated discharge in approximately 5-6 day(s).   Dewayne Hatch, MD 11/18/2019, 12:31 PM Pager: [phone] After 5pm on weekdays and 1pm on weekends: On Call pager [phone]

## 2019-11-19 NOTE — Progress Notes (Signed)
RT used a flutter valve for CPT instead of vest due to patient just finishing breakfast.

## 2019-11-19 NOTE — Progress Notes (Signed)
Cardiac Monitoring Event  Dysrhythmia: ST elevation   Symptoms:  Asymptomatic  Level of Consciousness: Alert, but confused   Last set of vital signs taken:  Temp: 97.9 F (36.6 C)  Pulse Rate: (!) 110  Resp: 19  BP: (!) 154/91  SpO2: 95 %  Name of MD Notified: Myrtie Hawk, MD  Time MD Notified: 11/19/19 1558  Comments/Actions Taken: Continue to monitor patient. No further orders at this time. Continue to replace K.

## 2019-11-19 NOTE — Progress Notes (Signed)
RT did not do the chest vest with patient at this time due to patient had just finished eating. RT did have patient do flutter valve x10 breaths with good effort. Patient has strong cough but unproductive at this time. HR 88, RR 18, Spo2 97%. No respiratory distress noted at this time. RT will continue to monitor as needed.

## 2019-11-19 NOTE — Progress Notes (Signed)
Patient has been a green MEWS for this RN's shift. Vital signs obtained this evening and patient is now a yellow MEWS. Patient assessed and no changes from this mornings assessment. MD notified and made aware. No further orders. Will re-implement yellow MEWS protocol and continue to monitor patient.

## 2019-11-19 NOTE — Progress Notes (Signed)
Cardiac Monitoring Event  Dysrhythmia: ST sustaining in the 160's  Symptoms: Asymptomatic  Level of Consciousness: A&OX1  Last set of vital signs taken:  Temp: 99.3 F (37.4 C)  Pulse Rate: (!) 115  Resp: (!) 22  BP: (!) 174/98  SpO2: 96 %  Name of MD Notified: Bridgett Larsson, MD   Time MD Notified: 11/19/19 1725  Comments/Actions Taken: MD stated that he would be up to the bedside to see the patient. Awaiting further orders. Will continue to monitor.

## 2019-11-19 NOTE — Progress Notes (Signed)
SLP Cancellation Note  Patient Details Name: Nicole Blackwell MRN: [id number] DOB: 10-07-1935   Cancelled treatment:       Reason Eval/Treat Not Completed: Patient declined all PO trials stating that she did not wish to eat or drink anything at this time.  SLP will f/u for dysphagia tx.     [name] [name] 11/19/2019, 12:20 PM

## 2019-11-19 NOTE — Progress Notes (Signed)
Seen pt with some short runs of SVT HR=150's; asymptomatic; V/S stable. Dr Coy Saunas informed. Will continue to monitor pt.

## 2019-11-20 DIAGNOSIS — E873 Alkalosis: Secondary | ICD-10-CM

## 2019-11-20 LAB — CBC WITH DIFFERENTIAL/PLATELET
Abs Immature Granulocytes: 0.07 10*3/uL (ref 0.00–0.07)
Basophils Absolute: 0 10*3/uL (ref 0.0–0.1)
Basophils Relative: 0 %
Eosinophils Absolute: 0 10*3/uL (ref 0.0–0.5)
Eosinophils Relative: 0 %
HCT: 39.8 % (ref 36.0–46.0)
Hemoglobin: 12.7 g/dL (ref 12.0–15.0)
Immature Granulocytes: 1 %
Lymphocytes Relative: 25 %
Lymphs Abs: 1.9 10*3/uL (ref 0.7–4.0)
MCH: 26 pg (ref 26.0–34.0)
MCHC: 31.9 g/dL (ref 30.0–36.0)
MCV: 81.6 fL (ref 80.0–100.0)
Monocytes Absolute: 0.7 10*3/uL (ref 0.1–1.0)
Monocytes Relative: 9 %
Neutro Abs: 5 10*3/uL (ref 1.7–7.7)
Neutrophils Relative %: 65 %
Platelets: 180 10*3/uL (ref 150–400)
RBC: 4.88 MIL/uL (ref 3.87–5.11)
RDW: 14.6 % (ref 11.5–15.5)
WBC: 7.7 10*3/uL (ref 4.0–10.5)
nRBC: 0 % (ref 0.0–0.2)

## 2019-11-20 LAB — COMPREHENSIVE METABOLIC PANEL
ALT: 16 U/L (ref 0–44)
AST: 22 U/L (ref 15–41)
Albumin: 2.1 g/dL — ABNORMAL LOW (ref 3.5–5.0)
Alkaline Phosphatase: 48 U/L (ref 38–126)
Anion gap: 10 (ref 5–15)
BUN: 9 mg/dL (ref 8–23)
CO2: 32 mmol/L (ref 22–32)
Calcium: 8 mg/dL — ABNORMAL LOW (ref 8.9–10.3)
Chloride: 96 mmol/L — ABNORMAL LOW (ref 98–111)
Creatinine, Ser: 0.9 mg/dL (ref 0.44–1.00)
GFR calc Af Amer: >60 mL/min (ref >60)
GFR calc non Af Amer: 60 mL/min — ABNORMAL LOW (ref >60)
Glucose, Bld: 99 mg/dL (ref 70–99)
Potassium: 3.4 mmol/L — ABNORMAL LOW (ref 3.5–5.1)
Sodium: 138 mmol/L (ref 135–145)
Total Bilirubin: 1.1 mg/dL (ref 0.3–1.2)
Total Protein: 5.4 g/dL — ABNORMAL LOW (ref 6.5–8.1)

## 2019-11-20 LAB — PHOSPHORUS: Phosphorus: 3.4 mg/dL (ref 2.5–4.6)

## 2019-11-20 LAB — MAGNESIUM: Magnesium: 1.9 mg/dL (ref 1.7–2.4)

## 2019-11-20 MED ORDER — SODIUM CHLORIDE 0.9 % IV SOLN
2.0000 g | Freq: Two times a day (BID) | INTRAVENOUS | Status: DC
  Administered 2019-11-20 – 2019-11-22 (×4): 2 g via INTRAVENOUS
  Filled 2019-11-20 (×4): qty 2

## 2019-11-20 MED ORDER — DIVALPROEX SODIUM 250 MG PO DR TAB
250.0000 mg | DELAYED_RELEASE_TABLET | Freq: Two times a day (BID) | ORAL | Status: DC
  Administered 2019-11-20 – 2019-11-21 (×2): 250 mg via ORAL
  Filled 2019-11-20 (×3): qty 1

## 2019-11-20 MED ORDER — MAGNESIUM SULFATE 2 GM/50ML IV SOLN
2.0000 g | Freq: Once | INTRAVENOUS | Status: AC
  Administered 2019-11-20: 2 g via INTRAVENOUS
  Filled 2019-11-20: qty 50

## 2019-11-20 MED ORDER — POTASSIUM CHLORIDE 20 MEQ PO PACK
20.0000 meq | PACK | Freq: Once | ORAL | Status: AC
  Administered 2019-11-20: 20 meq via ORAL
  Filled 2019-11-20: qty 1

## 2019-11-20 MED ORDER — POTASSIUM CHLORIDE CRYS ER 20 MEQ PO TBCR
40.0000 meq | EXTENDED_RELEASE_TABLET | Freq: Once | ORAL | Status: AC
  Administered 2019-11-20: 40 meq via ORAL
  Filled 2019-11-20: qty 2

## 2019-11-20 MED ORDER — POTASSIUM CHLORIDE CRYS ER 20 MEQ PO TBCR
20.0000 meq | EXTENDED_RELEASE_TABLET | Freq: Once | ORAL | Status: DC
  Filled 2019-11-20: qty 1

## 2019-11-20 NOTE — Progress Notes (Signed)
Pharmacy Antibiotic Note   Nicole Blackwell is a 84 y.o. female admitted on 11/17/2019 with pneumonia and sepsis.  Pharmacy has been consulted for cefepime dosing. SCr up slightly, CrCl ~40, cultures unrevealing.  Plan: Reduce cefepime to 2g IV q12h   Height: 5\' 5"  (165.1 cm) Weight: 56.6 kg (124 lb 12.5 oz) IBW/kg (Calculated) : 57  Temp (24hrs), Avg:99 F (37.2 C), Min:98.1 F (36.7 C), Max:99.5 F (37.5 C)  Recent Labs  Lab 11/17/19 0123 11/17/19 0246 11/17/19 0600 11/17/19 1825 11/18/19 0144 11/19/19 1009 11/19/19 2104 11/20/19 0518  WBC 2.5*  --   --   --  6.9 8.6  --  7.7  CREATININE 1.11*  --   --   --  0.85 0.79 0.79 0.90  LATICACIDVEN 8.1* 7.1* 6.3* 3.9* 3.1*  --   --   --     Estimated Creatinine Clearance: 43.1 mL/min (by C-G formula based on SCr of 0.9 mg/dL).    Allergies  Allergen Reactions  . Penicillin G Benzathine Swelling    Did it involve swelling of the face/tongue/throat, SOB, or low BP? Unknown Did it involve sudden or severe rash/hives, skin peeling, or any reaction on the inside of your mouth or nose? Unknown Did you need to seek medical attention at a hospital or doctor's office? Unknown When did it last happen?unknown If all above answers are "NO", may proceed with cephalosporin use.  Marland Kitchen Penicillins Swelling    Did it involve swelling of the face/tongue/throat, SOB, or low BP? Unknown Did it involve sudden or severe rash/hives, skin peeling, or any reaction on the inside of your mouth or nose? Unknown Did you need to seek medical attention at a hospital or doctor's office? Unknown When did it last happen?unknown If all above answers are "NO", may proceed with cephalosporin use.    Arrie Senate, PharmD, BCPS Clinical Pharmacist [phone] Please check AMION for all [place] numbers 11/20/2019

## 2019-11-20 NOTE — Progress Notes (Signed)
  Speech Language Pathology Treatment: Dysphagia  Patient Details Name: Nicole Blackwell MRN: [id number] DOB: [birth year] Today's Date: 11/20/2019 Time: 3329-5188 SLP Time Calculation (min) (ACUTE ONLY): 17 min  Assessment / Plan / Recommendation Clinical Impression  Pt was seen for dysphagia treatment. She was alert but did not communicate verbally or follow verbal commands. Pt was seen with lunch tray which included finely chopped meat, chopped vegetables, and mashed potatoes. Pt exhibited a cough at baseline and intermittently during the meal, but no significant increase in coughing was noted with p.o. intake. Mastication was significantly prolonged with dysphagia 2 solids and even with dysphagia 1 (puree) boluses. Length of mastication/bolus manipulation was not improved with verbal prompts. Mild lingual residue was demonstrated with solids but was cleared with liquid washes. It is recommended that the pt's diet be continued and SLP will continue to follow pt.    HPI HPI: Nicole Blackwell is a 84 y.o. female with medical history significant for dementia with no behavioral disturbances, essential hypertension, chronic diastolic CHF, seizure disorder on Depakote, oropharyngeal dysphagia, who presented from SNF due to increasing shortness of breath. MBS 9/25: mild oropharyngeal dysphagia c/b trace, transient laryngeal penetration of thin liquid x1 on examination.       SLP Plan  Continue with current plan of care       Recommendations  Diet recommendations: Dysphagia 2 (fine chop);Thin liquid Liquids provided via: Cup;Straw Medication Administration: Crushed with puree Supervision: Staff to assist with self feeding;Trained caregiver to feed patient Compensations: Minimize environmental distractions;Slow rate;Small sips/bites;Follow solids with liquid Postural Changes and/or Swallow Maneuvers: Seated upright 90 degrees;Upright 30-60 min after meal                Oral Care Recommendations: Oral  care QID;Staff/trained caregiver to provide oral care Follow up Recommendations: Skilled Nursing facility SLP Visit Diagnosis: Dysphagia, unspecified (R13.10) Plan: Continue with current plan of care       Rosella Crandell I. Hardin Negus, [place], [place] Office number [phone] Pager [place] 11/20/2019, 3:46 PM

## 2019-11-20 NOTE — Progress Notes (Signed)
Subjective:   Patient is more responsive than previously but somewhat impaired still. Patient states that she feels well this morning and that her SOB has improved, Continues to have a cough, but is having a hard time getting the phlegm up. Denies CP.  Objective:  Vital signs in last 24 hours: Vitals:   11/20/19 0400 11/20/19 0500 11/20/19 0733 11/20/19 1523  BP: 140/73 (!) 148/76 (!) 158/79 (!) 151/81  Pulse: 93 89 91 93  Resp: 18 19 20 17   Temp:   99.2 F (37.3 C) 98.2 F (36.8 C)  TempSrc:   Oral Oral  SpO2: 99% 99% 99% 100%  Weight:      Height:       General: Patient appears well. No acute distress. Eyes: Sclera non-icteric. No conjunctival injection.  Respiratory: Diffuse rhonchi and rales present. Rattling cough, reduced expiratory effort. Increased work of breathing on 2L [place].  Cardiovascular: Rate is borderline tachycardic. Rhythm is regular. No murmurs, rubs, or gallops. Trace lower extremity bilateral pitting edema. Mild JVP, improved from prior. Abdominal: Soft and non-tender to palpation. Bowel sounds intact. No rebound or guarding. Skin: No lesions. No rashes.   Assessment/Plan:  Principal Problem:   HCAP (healthcare-associated pneumonia) Active Problems:   Chronic diastolic CHF (congestive heart failure) (HCC)   Vascular dementia without behavioral disturbance (HCC)   Dementia (HCC)   Protein calorie malnutrition ([place])   Severe sepsis ([place])   This is a 84 year old female, with past medical history significant for dementia, HTN, diastolic heart failure, seizure disorder (on Depakote) oropharyngeal dysphagia, who presented from skilled nursing facility for worsening of shortness of breath, hypoxia at 80s on room air and and temperature of 100.8 at SNF.  She was tachycardic, tachypneic with leukopenia and extremely elevated lactic acid: 8.1 and procalcitonin of 18.56 on arrival.  Chest x-ray suggestive of multifocal pneumonia, R>L likely secondary to aspiration.   Patient started on IV fluids, IV antibiotic for sepsis secondary to HCAP, however due to acute hypoxic respiratory distress, PCCM was consulted and patient admitted in ICU.  She did not require intubation.. Now on 2L with improved work of breathing and LE edema.   Acute hypoxic resp failure and Sepsis 2/2 multifocal PNA R>L: In setting of sepsis and volume overload after IV hydration. Did not require intubation. On arrival: Had leukopenia, WBC 2.5K, lactic acidosis 8.1, respiration rate of 34, chest x-ray consistent with multifocal pneumonia. Received IV vancomycin, levofloxacin and metronidazole x 1 day.  Now on cefepime since 9/25. -Legionella serotype 1 urine antigen is negative -Blood culture negative x3 days. -WBC 7.7  -Currently on 2L [place]  -Continue cefepime. (Started 9/25). -Supplemental oxygen to keep O2 saturation more than 92% -Sputum culture sent to lab but not acceptable for testing -Respiratory therapy and flutter valve -Follow-up blood culture (11/17/2019).  (No growth to date) -Follow-up urine culture (11/17/2019). (no growth to date) -Continue albuterol nebulizer every 6 hours -Apears euvolemic on exam, holding additional lasix  Electrolyte abnormalities: K 3.4 today, repleted. Mg 1.9 today. Will continue to monitor.   Dysphagia: This has been a chronic problem. Modified barium swallow study performed 9/25, recommended dysphagia 2 diet with medications administered whole in pure.  Mild aspiration risk.  -Dysphagia diet  Tachycardia: Intermittent episodes of SVT vs VT, non-sustained. Previously had elevated troponins. No chest pain. EKG showed LBBB, unchanged from prior. Some hypokalemia as above, repleating.   -Replacing electrolytes -Continue telemetry -Consider amiodarone if sustained Vtach, holding BB due to concerns for bronchial obstruction.  HFpEF: Echo 11/17/2019 showed EF of 60-65%, right ventricular pressure and volume overload.  Without regional wall  motion abnormalities. Prior echo on 08/28/2018 with EF of 60-65% and moderate concentric LV hypertrophy. Euvolemic on exam, holding diuretics today.   -Monitor volume status closely -Strict INO's  Metabolic Alkalosis  VBG shows pH 7.518, pCO2 40.2, pO2 <31, and Bicarb 32.5.  Unclear etiology. Will work up with urine pH, urine chloride, urine sodium - Note electrolytes may be altered in setting of recent diuretic  Seizure disorder: On IV valproate, transitioned to home PO depakote today. No evidence of seizures.   Prior to Admission Living Arrangement: SNF Anticipated Discharge Location: SNF Barriers to Discharge: Continued medical management Dispo: Anticipated discharge in approximately 4-5 day(s).   Jeralyn Bennett, MD 11/20/2019, 4:32 PM Pager: [phone] After 5pm on weekdays and 1pm on weekends: On Call pager [phone]

## 2019-11-21 ENCOUNTER — Inpatient Hospital Stay (HOSPITAL_COMMUNITY): Payer: Medicare (Managed Care)

## 2019-11-21 DIAGNOSIS — G9340 Encephalopathy, unspecified: Secondary | ICD-10-CM

## 2019-11-21 DIAGNOSIS — R4182 Altered mental status, unspecified: Secondary | ICD-10-CM

## 2019-11-21 LAB — URINALYSIS, ROUTINE W REFLEX MICROSCOPIC
Bilirubin Urine: NEGATIVE
Glucose, UA: NEGATIVE mg/dL
Ketones, ur: 20 mg/dL — AB
Leukocytes,Ua: NEGATIVE
Nitrite: NEGATIVE
Protein, ur: 100 mg/dL — AB
Specific Gravity, Urine: 1.036 — ABNORMAL HIGH (ref 1.005–1.030)
pH: 5 (ref 5.0–8.0)

## 2019-11-21 LAB — COMPREHENSIVE METABOLIC PANEL
ALT: 14 U/L (ref 0–44)
AST: 20 U/L (ref 15–41)
Albumin: 2.1 g/dL — ABNORMAL LOW (ref 3.5–5.0)
Alkaline Phosphatase: 47 U/L (ref 38–126)
Anion gap: 9 (ref 5–15)
BUN: 10 mg/dL (ref 8–23)
CO2: 30 mmol/L (ref 22–32)
Calcium: 8.2 mg/dL — ABNORMAL LOW (ref 8.9–10.3)
Chloride: 100 mmol/L (ref 98–111)
Creatinine, Ser: 0.7 mg/dL (ref 0.44–1.00)
GFR calc Af Amer: >60 mL/min (ref >60)
GFR calc non Af Amer: >60 mL/min (ref >60)
Glucose, Bld: 81 mg/dL (ref 70–99)
Potassium: 4 mmol/L (ref 3.5–5.1)
Sodium: 139 mmol/L (ref 135–145)
Total Bilirubin: 1 mg/dL (ref 0.3–1.2)
Total Protein: 5.4 g/dL — ABNORMAL LOW (ref 6.5–8.1)

## 2019-11-21 LAB — CBC WITH DIFFERENTIAL/PLATELET
Abs Immature Granulocytes: 0.16 10*3/uL — ABNORMAL HIGH (ref 0.00–0.07)
Basophils Absolute: 0.1 10*3/uL (ref 0.0–0.1)
Basophils Relative: 1 %
Eosinophils Absolute: 0.1 10*3/uL (ref 0.0–0.5)
Eosinophils Relative: 1 %
HCT: 43.5 % (ref 36.0–46.0)
Hemoglobin: 13.9 g/dL (ref 12.0–15.0)
Immature Granulocytes: 2 %
Lymphocytes Relative: 29 %
Lymphs Abs: 2 10*3/uL (ref 0.7–4.0)
MCH: 26.5 pg (ref 26.0–34.0)
MCHC: 32 g/dL (ref 30.0–36.0)
MCV: 83 fL (ref 80.0–100.0)
Monocytes Absolute: 0.8 10*3/uL (ref 0.1–1.0)
Monocytes Relative: 12 %
Neutro Abs: 3.7 10*3/uL (ref 1.7–7.7)
Neutrophils Relative %: 55 %
Platelets: 180 10*3/uL (ref 150–400)
RBC: 5.24 MIL/uL — ABNORMAL HIGH (ref 3.87–5.11)
RDW: 14.9 % (ref 11.5–15.5)
WBC: 6.7 10*3/uL (ref 4.0–10.5)
nRBC: 0 % (ref 0.0–0.2)

## 2019-11-21 LAB — MAGNESIUM: Magnesium: 2.6 mg/dL — ABNORMAL HIGH (ref 1.7–2.4)

## 2019-11-21 LAB — PHOSPHORUS: Phosphorus: 2.7 mg/dL (ref 2.5–4.6)

## 2019-11-21 MED ORDER — VALPROATE SODIUM 500 MG/5ML IV SOLN
250.0000 mg | Freq: Two times a day (BID) | INTRAVENOUS | Status: DC
  Administered 2019-11-21 – 2019-11-22 (×2): 250 mg via INTRAVENOUS
  Filled 2019-11-21 (×3): qty 2.5

## 2019-11-21 NOTE — Procedures (Addendum)
Patient Name: MACAILA TAHIR  MRN: [id number]  Epilepsy Attending: Lora Havens  Referring Physician/Provider: Dr. Jeralyn Bennett Date: 11/21/2019 Duration: 25 minutes  Patient history: 84 year old female with history of seizure disorder.  EEG to evaluate for seizures.  Level of alertness: Awake  AEDs during EEG study: Valproate  Technical aspects: This EEG study was done with scalp electrodes positioned according to the 10-20 International system of electrode placement. Electrical activity was acquired at a sampling rate of 500Hz  and reviewed with a high frequency filter of 70Hz  and a low frequency filter of 1Hz . EEG data were recorded continuously and digitally stored.   Description: No posterior dominant rhythm was seen.  EEG showed continuous generalized 3-5Hz  theta-delta slowing.  Triphasic waves, generalized were also noted.  Hyperventilation and photic stimulation were not performed.     ABNORMALITY -Continuous slow, generalized -Triphasic waves, generalized  IMPRESSION: This study is suggestive of moderate diffuse encephalopathy, nonspecific etiology but could be secondary to toxic-metabolic causes. No seizures or definite epileptiform discharges were seen throughout the recording.   Harper Smoker Barbra Sarks

## 2019-11-21 NOTE — Progress Notes (Signed)
STAT EEG Completed; Results Pending; Dr Hortense Ramal notifed.

## 2019-11-21 NOTE — Care Management Important Message (Signed)
Important Message  Patient Details  Name: Nicole Blackwell MRN: [id number] Date of Birth: May 27, 1935   Medicare Important Message Given:  Yes     Orbie Pyo 11/21/2019, 3:30 PM

## 2019-11-21 NOTE — Progress Notes (Signed)
Subjective:   When I went to speak with the patient by the bedside, she was initially responsive and suggested that she did continue to have some trouble breathing, but did not endorse or respond "yes" to any new complaints. Unable to obtain full history from patient as she became less responsive.  Objective:  Vital signs in last 24 hours: Vitals:   11/21/19 0338 11/21/19 0732 11/21/19 1108 11/21/19 1459  BP: (!) 155/78 130/86 (!) 153/76 (!) 152/88  Pulse: 90 87 95 97  Resp: 17 17 20 20   Temp:  98.2 F (36.8 C) 98.7 F (37.1 C) 98.7 F (37.1 C)  TempSrc:  Oral Axillary Axillary  SpO2:  100% 98% 97%  Weight:      Height:       General: Patient appears chronically ill, in no acute distress.  Eyes: Sclera non-icteric. No conjunctival injection.  Respiratory: Diffuse rhonchi and rales present. Rattling cough, reduced expiratory effort. Increased work of breathing on 2L [place].  Cardiovascular: Rate is borderline tachycardic. Rhythm is regular. No murmurs, rubs, or gallops. No lower extremity pitting edema. Mild, improved JVD Abdominal: Soft and non-tender to palpation. Bowel sounds intact. No rebound or guarding. Neurological: There is twitching about the face and triceps area, worse on the left side. Patient is minimally responsive, though able to track with her eyes. Initially not speaking on exam although answered "yes" to questions. Diffuse generalized weakness.  Skin: No lesions. No rashes.   Assessment/Plan:  Principal Problem:   HCAP (healthcare-associated pneumonia) Active Problems:   Chronic diastolic CHF (congestive heart failure) (HCC)   Vascular dementia without behavioral disturbance (HCC)   Dementia (HCC)   Protein calorie malnutrition ([place])   Severe sepsis ([place])   This is a 84 year old female, with past medical history significant for dementia, HTN, diastolic heart failure, seizure disorder (on Depakote) oropharyngeal dysphagia, who presented from skilled nursing  facility for worsening of shortness of breath, hypoxia at 80s on room air and and temperature of 100.8 at SNF.  She was tachycardic, tachypneic with leukopenia and extremely elevated lactic acid: 8.1 and procalcitonin of 18.56 on arrival.  Chest x-ray suggestive of multifocal pneumonia, R>L likely secondary to aspiration.  Patient started on IV fluids, IV antibiotic for sepsis secondary to HCAP, however due to acute hypoxic respiratory distress, PCCM was consulted and patient admitted in ICU.  She did not require intubation.. Now on 2L with improved work of breathing and LE edema.   Acute hypoxic resp failure and Sepsis 2/2 multifocal PNA R>L: In setting of sepsis and volume overload after IV hydration. Did not require intubation. On arrival: Had leukopenia, WBC 2.5K, lactic acidosis 8.1, respiration rate of 34, chest x-ray consistent with multifocal pneumonia. Received IV vancomycin, levofloxacin and metronidazole x 1 day.  Now on cefepime since 9/25. No leukocytosis  -Legionella serotype 1 urine antigen is negative -Blood culture negative x4 days -Currently on 2L [place] -Continue cefepime. (Started 9/25). -Supplemental oxygen to keep O2 saturation more than 92% -Sputum culture sent to lab but not acceptable for testing -Respiratory therapy and flutter valve -Follow-up blood culture (11/17/2019).  (No growth to date) -Follow-up urine culture (11/17/2019). (no growth to date) -Continue levalbuterol q6 hours and albuterol PRN  -Apears euvolemic on exam, holding additional lasix  Electrolyte abnormalities: Electrolytes improved today after replacement.  - Continue to monitor  Dysphagia: This has been a chronic problem. Modified barium swallow study performed 9/25, recommended dysphagia 2 diet with medications administered whole in pure.  Mild aspiration  risk.  -Dysphagia diet  Tachycardia: Intermittent episodes of SVT vs VT, non-sustained. Previously had elevated troponins. No chest pain.  EKG showed LBBB, unchanged from prior. Some hypokalemia as above,repleted  - Goal K > 4, Mg > 2 -Continue telemetry -Consider amiodarone if sustained Vtach, holding BB due to concerns for bronchial obstruction.   HFpEF: Echo 11/17/2019 showed EF of 60-65%, right ventricular pressure and volume overload.  Without regional wall motion abnormalities. Prior echo on 08/28/2018 with EF of 60-65% and moderate concentric LV hypertrophy. Euvolemic on exam, continuing to hold diuretics.   -Monitor volume status closely -Strict INO's  Metabolic Alkalosis  VBG shows pH 7.518, pCO2 40.2, pO2 <31, and Bicarb 32.5.  Unclear etiology. Will work up with urine pH, urine chloride, urine sodium - Note electrolytes may be altered in setting of recent diuretic  Seizure disorder: Patient takes depakote 250mg  BID at home. She had episode concerning for partial seizure today while she was being examined; however, EEG was obtained which showed moderate diffuse encephalopathy but no seizure or epileptiform discharges. Patient became more alert and responsive towards end of examination today. - restarted IV Valproate at home dose due to concern for dysphagia - Continue to monitor on seizure precautions.  Prior to Admission Living Arrangement: SNF Anticipated Discharge Location: SNF Barriers to Discharge: Continued medical management Dispo: Anticipated discharge in approximately 4 day(s).   Jeralyn Bennett, MD 11/21/2019, 3:44 PM Pager: [phone] After 5pm on weekdays and 1pm on weekends: On Call pager [phone]

## 2019-11-22 ENCOUNTER — Inpatient Hospital Stay (HOSPITAL_COMMUNITY): Payer: Medicare (Managed Care)

## 2019-11-22 LAB — CBC WITH DIFFERENTIAL/PLATELET
Abs Immature Granulocytes: 0.32 10*3/uL — ABNORMAL HIGH (ref 0.00–0.07)
Basophils Absolute: 0.1 10*3/uL (ref 0.0–0.1)
Basophils Relative: 1 %
Eosinophils Absolute: 0 10*3/uL (ref 0.0–0.5)
Eosinophils Relative: 0 %
HCT: 43.8 % (ref 36.0–46.0)
Hemoglobin: 13.8 g/dL (ref 12.0–15.0)
Immature Granulocytes: 4 %
Lymphocytes Relative: 18 %
Lymphs Abs: 1.5 10*3/uL (ref 0.7–4.0)
MCH: 26.2 pg (ref 26.0–34.0)
MCHC: 31.5 g/dL (ref 30.0–36.0)
MCV: 83.3 fL (ref 80.0–100.0)
Monocytes Absolute: 0.8 10*3/uL (ref 0.1–1.0)
Monocytes Relative: 10 %
Neutro Abs: 5.6 10*3/uL (ref 1.7–7.7)
Neutrophils Relative %: 67 %
Platelets: 255 10*3/uL (ref 150–400)
RBC: 5.26 MIL/uL — ABNORMAL HIGH (ref 3.87–5.11)
RDW: 15 % (ref 11.5–15.5)
WBC: 8.2 10*3/uL (ref 4.0–10.5)
nRBC: 0 % (ref 0.0–0.2)

## 2019-11-22 LAB — CULTURE, BLOOD (ROUTINE X 2)
Culture: NO GROWTH
Culture: NO GROWTH
Special Requests: ADEQUATE
Special Requests: ADEQUATE

## 2019-11-22 LAB — PHOSPHORUS: Phosphorus: 3.6 mg/dL (ref 2.5–4.6)

## 2019-11-22 LAB — COMPREHENSIVE METABOLIC PANEL
ALT: 15 U/L (ref 0–44)
AST: 19 U/L (ref 15–41)
Albumin: 2.2 g/dL — ABNORMAL LOW (ref 3.5–5.0)
Alkaline Phosphatase: 56 U/L (ref 38–126)
Anion gap: 15 (ref 5–15)
BUN: 19 mg/dL (ref 8–23)
CO2: 25 mmol/L (ref 22–32)
Calcium: 8.5 mg/dL — ABNORMAL LOW (ref 8.9–10.3)
Chloride: 101 mmol/L (ref 98–111)
Creatinine, Ser: 0.88 mg/dL (ref 0.44–1.00)
GFR calc Af Amer: >60 mL/min (ref >60)
GFR calc non Af Amer: >60 mL/min (ref >60)
Glucose, Bld: 104 mg/dL — ABNORMAL HIGH (ref 70–99)
Potassium: 4 mmol/L (ref 3.5–5.1)
Sodium: 141 mmol/L (ref 135–145)
Total Bilirubin: 1.2 mg/dL (ref 0.3–1.2)
Total Protein: 5.9 g/dL — ABNORMAL LOW (ref 6.5–8.1)

## 2019-11-22 LAB — PROCALCITONIN: Procalcitonin: 2.45 ng/mL

## 2019-11-22 LAB — AMMONIA: Ammonia: 11 umol/L (ref 9–35)

## 2019-11-22 LAB — VALPROIC ACID LEVEL: Valproic Acid Lvl: 36 ug/mL — ABNORMAL LOW (ref 50.0–100.0)

## 2019-11-22 IMAGING — DX DG CHEST 1V PORT
1 series · 1 of 1 positions shown · non-contrast
Comparison: [DATE] chest radiograph and chest CT

CLINICAL DATA: Shortness of breath

EXAM:
PORTABLE CHEST 1 VIEW

[chest ap]
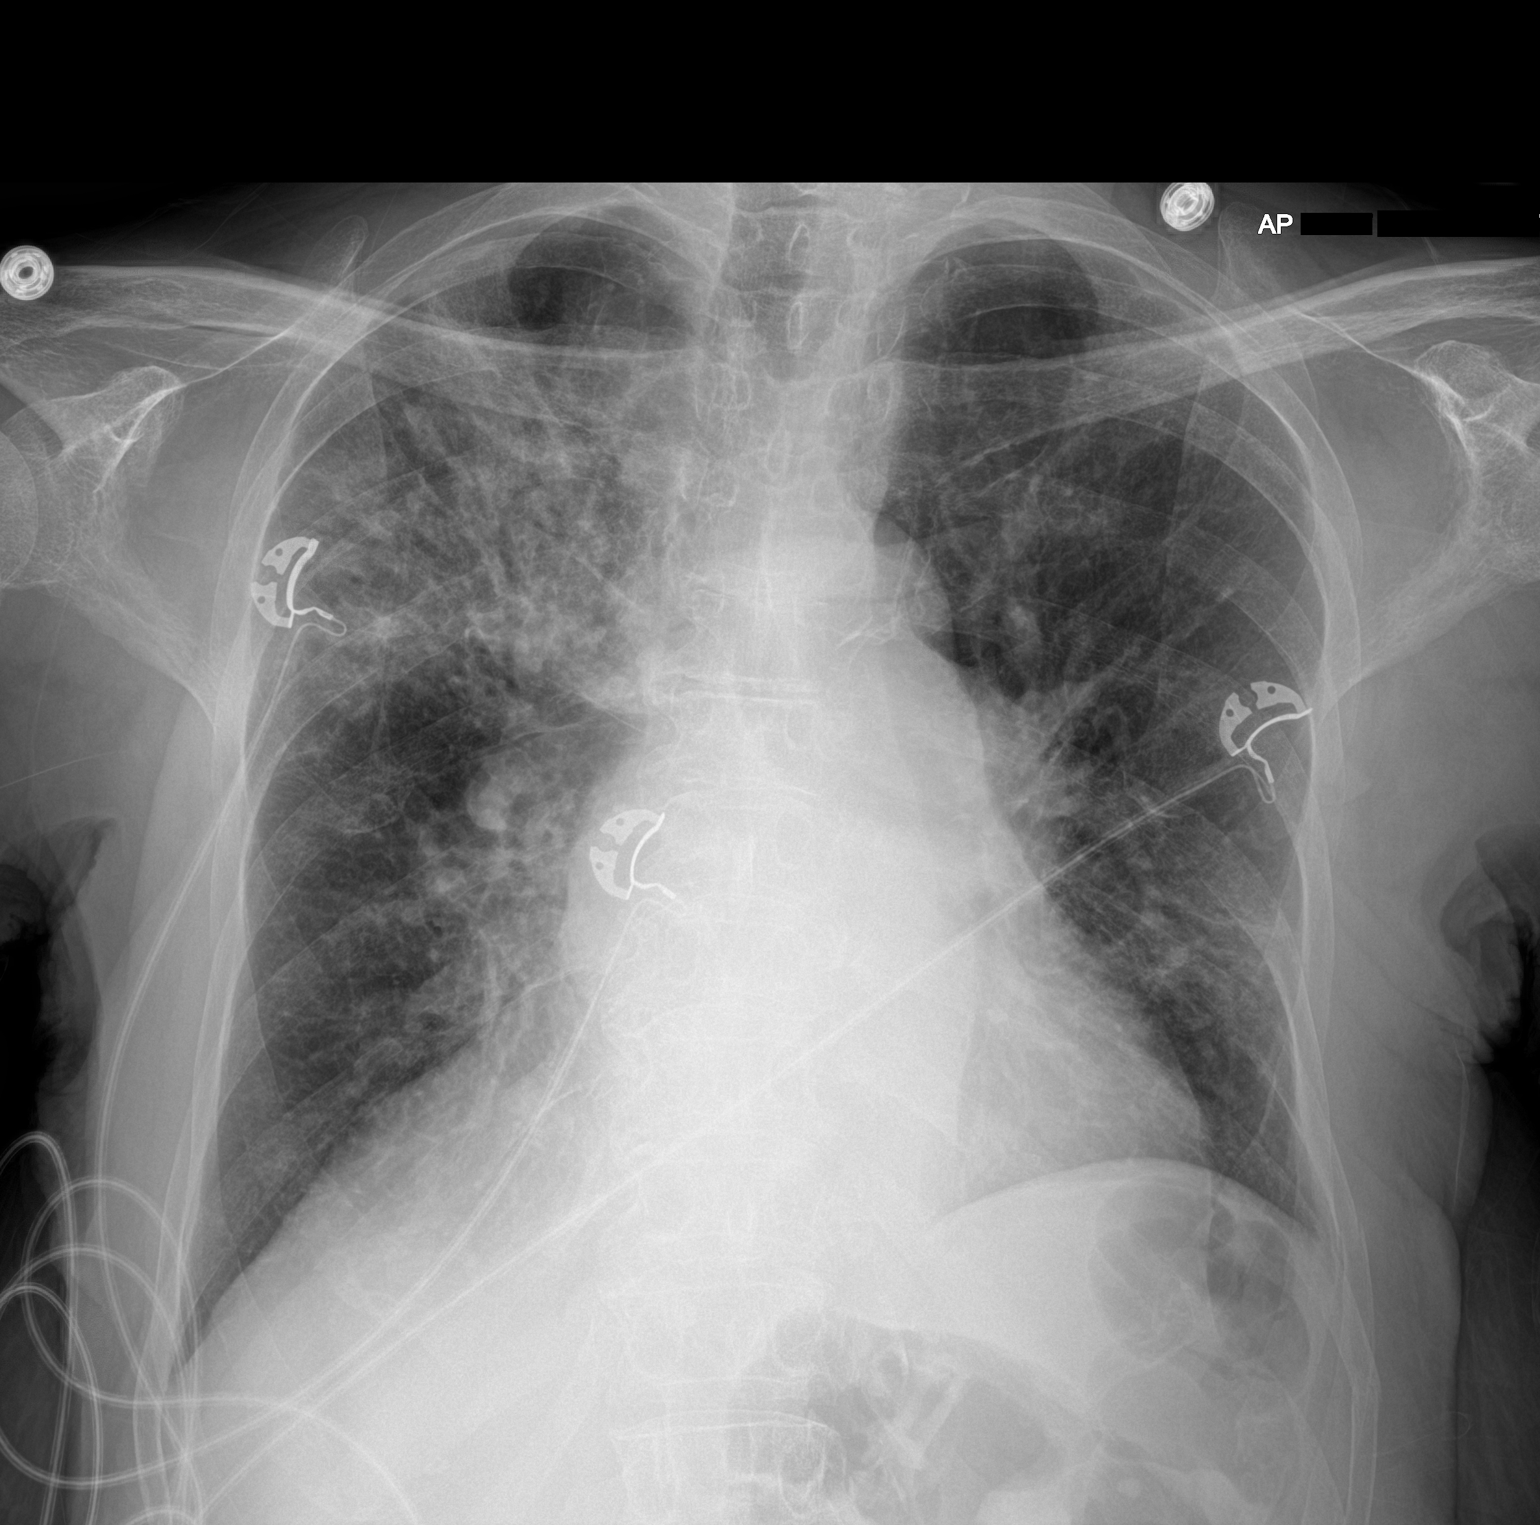

[1 of 1 positions shown; findings below may reference images not displayed]

FINDINGS: Airspace opacity remains throughout the right upper lobe with less
consolidation in this area compared to recent studies. There is been
significant partial clearing of airspace opacity from the left lower
lobe with mild patchy ill-defined opacity remaining in this area.
Lungs elsewhere clear. Heart is slightly enlarged with pulmonary
vascularity within normal limits. No adenopathy. There is aortic
atherosclerosis. No bone lesions.
IMPRESSION: Interval significant clearing of airspace opacity from the left
lower lobe with mild ill-defined opacity and atelectasis in this
area. Less consolidation right upper lobe with more ill-defined
infiltrate remaining in the right upper lobe. Appearance is
consistent with multifocal pneumonia of potential atypical organism
etiology. Question [QN] status in this regard. No new opacity
evident.

Stable cardiac prominence. No adenopathy appreciable by radiography.

Aortic Atherosclerosis ([QN]-[QN]).

## 2019-11-22 IMAGING — CT CT HEAD W/O CM
4 series · 16 of 47 positions shown, 18 images · non-contrast
Comparison: [DATE].

CLINICAL DATA: Altered mental status.

EXAM:
CT HEAD WITHOUT CONTRAST
TECHNIQUE: Contiguous axial images were obtained from the base of the skull
through the vertex without intravenous contrast.

[Series 3: head without · axial · non-contrast · 0.46mm/px · z∈[-199,-84]mm · 7 of 31 slices shown, 9 images]
[im 4/31  brain]
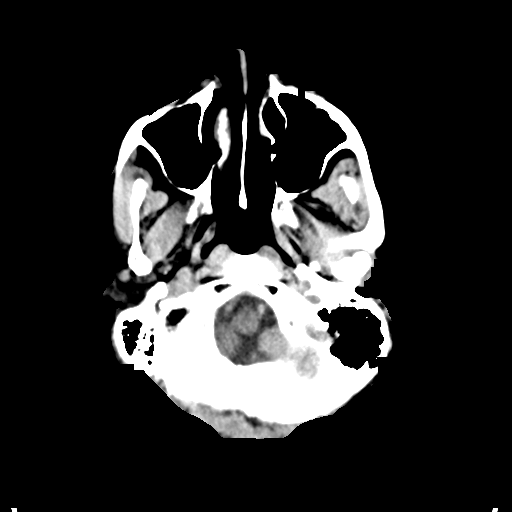
[im 4/31  bone]
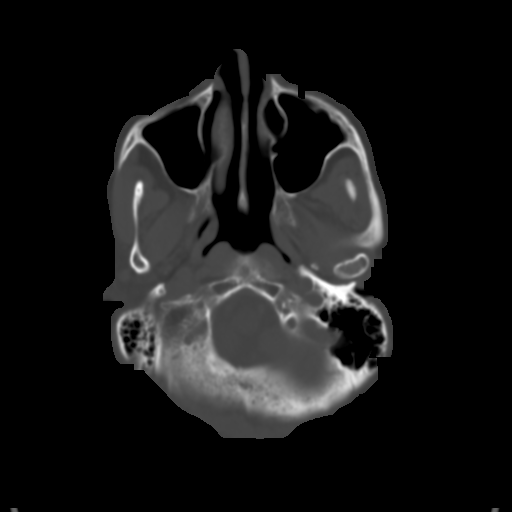
[im 8/31  brain]
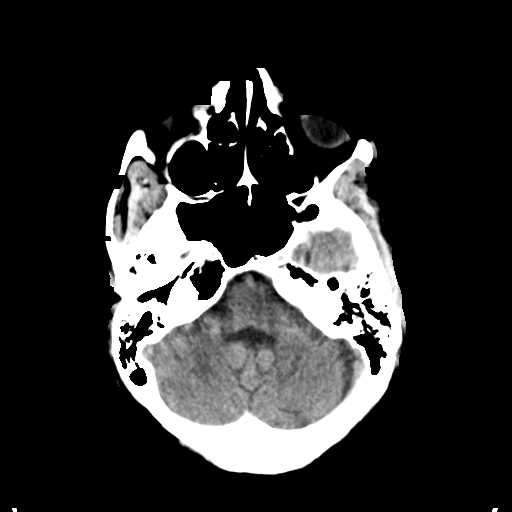
[im 12/31  brain]
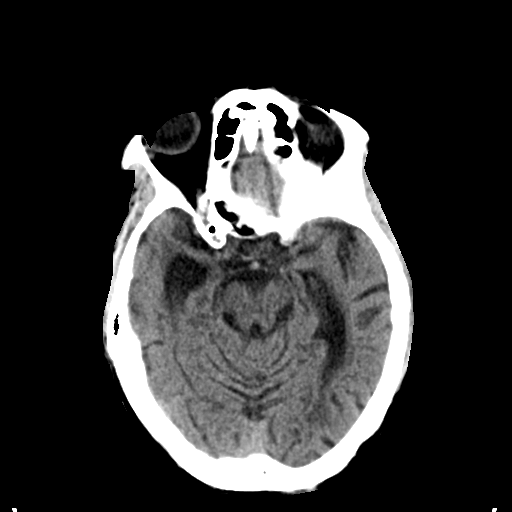
[im 16/31  brain]
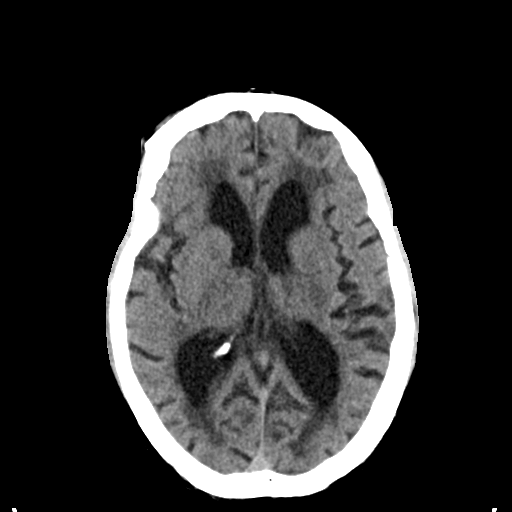
[im 19/31  brain]
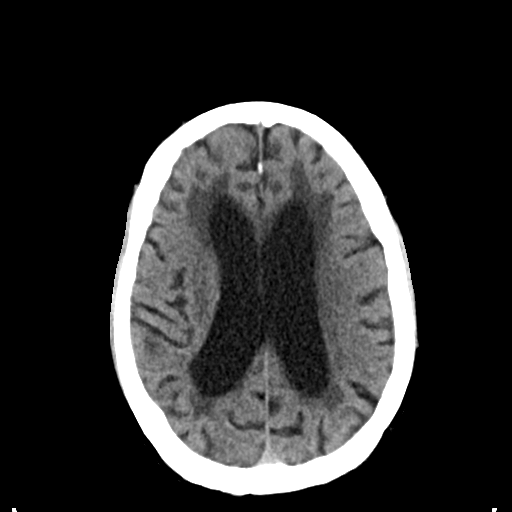
[im 19/31  bone]
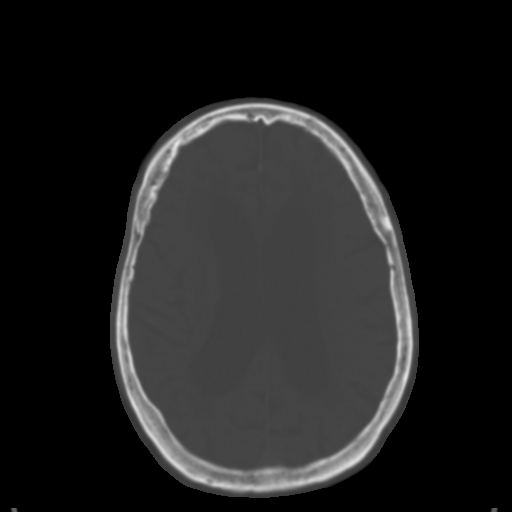
[im 23/31  brain]
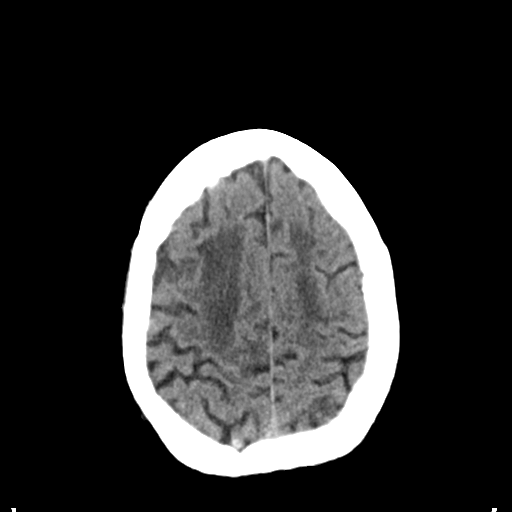
[im 27/31  brain]
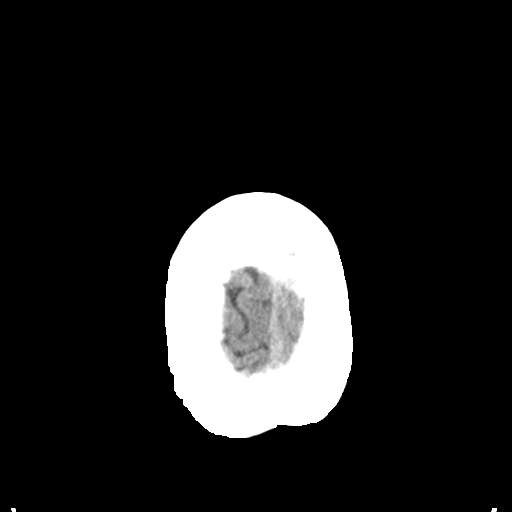

[Series 4: head bone · axial · 0.46mm/px · z∈[-197,-167]mm · 3 of 77 slices shown]
[im 8/77  bone]
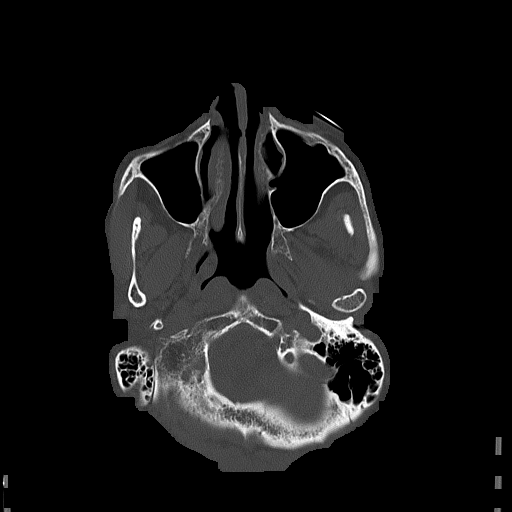
[im 16/77  bone]
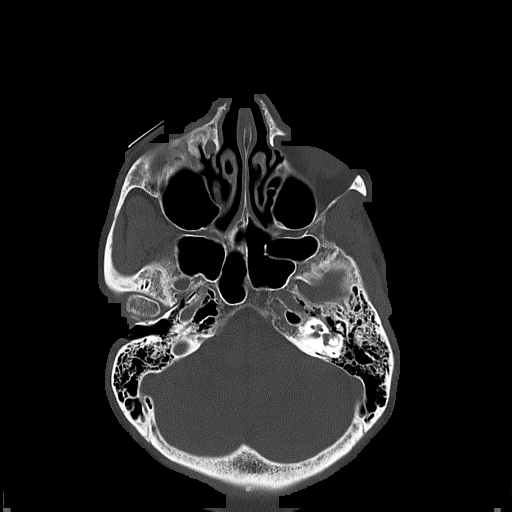
[im 23/77  bone]
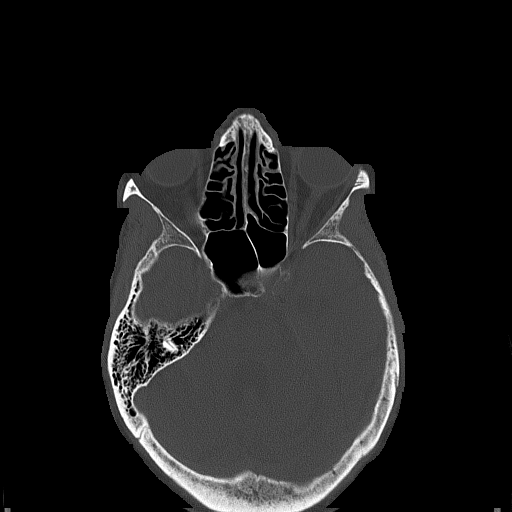

[Series 5: head without cor · coronal · non-contrast · 0.31mm/px · 3 of 71 slices shown]
[im 24/71  brain]
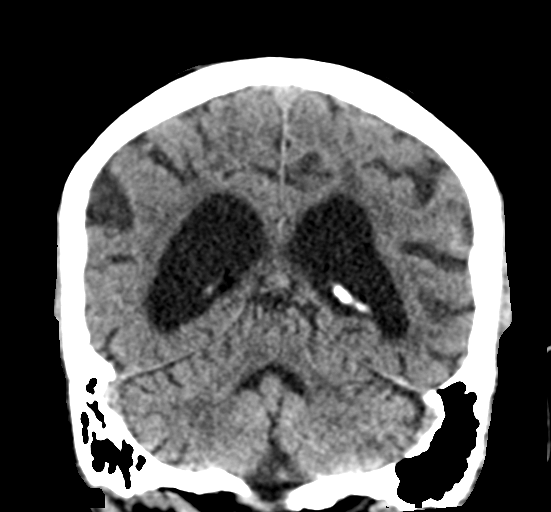
[im 32/71  brain]
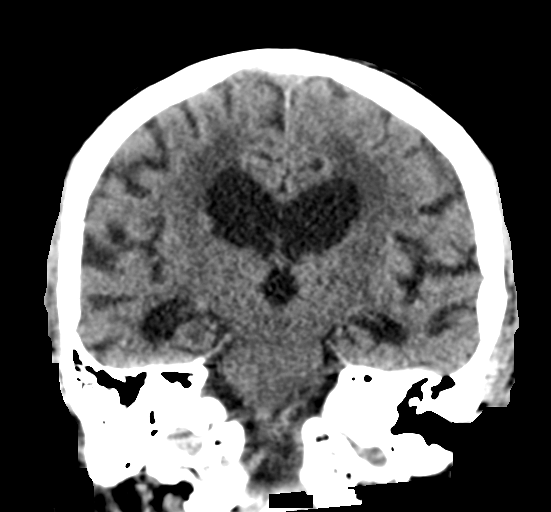
[im 39/71  brain]
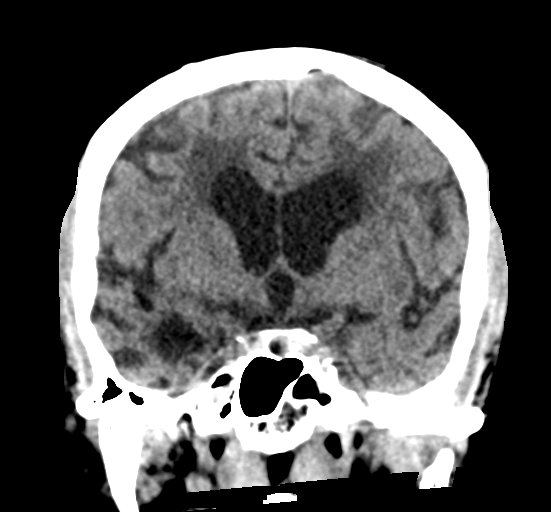

[Series 6: head without sag · sagittal · non-contrast · 0.33mm/px · 3 of 65 slices shown]
[im 22/65  brain]
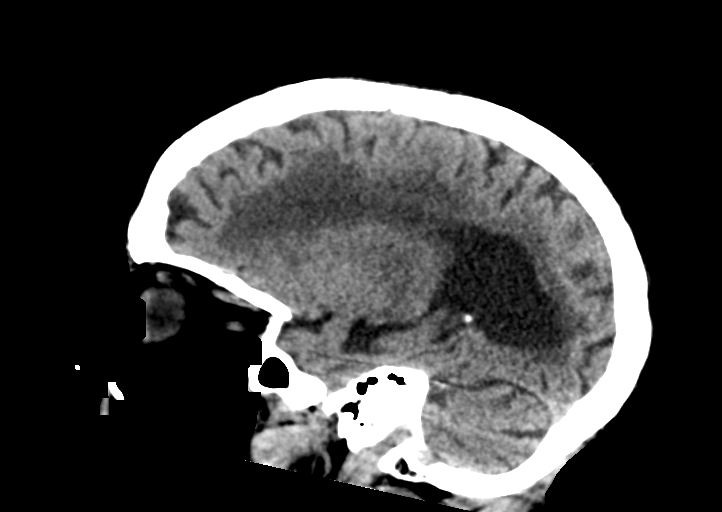
[im 33/65  brain]
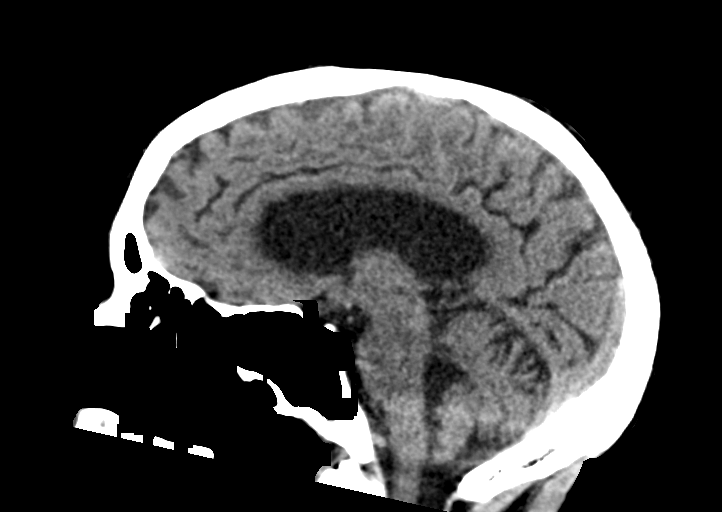
[im 43/65  brain]
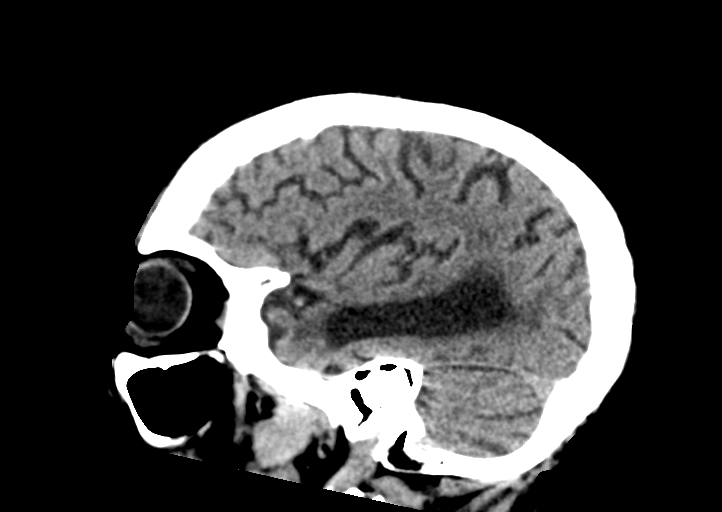

[16 of 47 positions shown; findings below may reference images not displayed]

FINDINGS: Brain: Minimal diffuse cortical atrophy is noted. Mild chronic
ischemic white matter disease is noted. No mass effect or midline
shift is noted. Ventricular size is within normal limits. There is
no evidence of mass lesion, hemorrhage or acute infarction.

Vascular: No hyperdense vessel or unexpected calcification.

Skull: Normal. Negative for fracture or focal lesion.

Sinuses/Orbits: No acute finding.

Other: None.
IMPRESSION: No acute intracranial abnormality seen.

## 2019-11-22 MED ORDER — LORAZEPAM 2 MG/ML IJ SOLN: 1.0000 mg | INTRAMUSCULAR | Status: DC | PRN

## 2019-11-22 MED ORDER — VALPROATE SODIUM 500 MG/5ML IV SOLN
250.0000 mg | Freq: Three times a day (TID) | INTRAVENOUS | Status: DC
  Administered 2019-11-22 – 2019-11-26 (×11): 250 mg via INTRAVENOUS
  Filled 2019-11-22 (×13): qty 2.5

## 2019-11-22 MED ORDER — VALPROATE SODIUM 500 MG/5ML IV SOLN
1000.0000 mg | Freq: Once | INTRAVENOUS | Status: AC
  Administered 2019-11-22: 1000 mg via INTRAVENOUS
  Filled 2019-11-22: qty 10

## 2019-11-22 NOTE — Progress Notes (Signed)
  Speech Language Pathology Treatment: Dysphagia  Patient Details Name: Nicole Blackwell MRN: [id number] DOB: January 23, 1936 Today's Date: 11/22/2019 Time: 4765-4650 SLP Time Calculation (min) (ACUTE ONLY): 11 min  Assessment / Plan / Recommendation Clinical Impression  Pt was seen for dysphagia. She was lethargic during the session and Suezanne Jacquet, RN indicated she has been like this today so p.o. meds and meals have been held. Pt vocalized with a wet vocal quality and opened her eyes but did not verbalize during the session. She demonstrated impaired bolus awareness and no bolus manipulation or A-P transport was observed despite verbal and tactile cues. Boluses were ultimately removed from the pt's oral cavity with oral swab. Pt does not present as a candidate for safe oral intake at this time and it is therefore recommended that she be NPO. RN and MD were advised of pt's decline in swallow function and recommendations. SLP will continue to follow pt to assess improvement and readiness for p.o. intake.    HPI HPI: Nicole Blackwell is a 84 y.o. female with medical history significant for dementia with no behavioral disturbances, essential hypertension, chronic diastolic CHF, seizure disorder on Depakote, oropharyngeal dysphagia, who presented from SNF due to increasing shortness of breath. MBS 9/25: mild oropharyngeal dysphagia c/b trace, transient laryngeal penetration of thin liquid x1 on examination. EEG 9/29: moderate diffuse encephalopathy, nonspecific etiology but could be secondary to toxic-metabolic causes.      SLP Plan  Continue with current plan of care       Recommendations  Diet recommendations: NPO Medication Administration: Via alternative means                Oral Care Recommendations: Oral care QID;Staff/trained caregiver to provide oral care Follow up Recommendations: Skilled Nursing facility SLP Visit Diagnosis: Dysphagia, unspecified (R13.10) Plan: Continue with current plan of  care       Kimmie Berggren I. Hardin Negus, [place], [place] Office number [phone] Pager [place] 11/22/2019, 2:55 PM

## 2019-11-22 NOTE — Progress Notes (Signed)
LTM EEG following spot EEG. Same leads. Notified neuro

## 2019-11-22 NOTE — Progress Notes (Signed)
EEG completed, results pending. 

## 2019-11-22 NOTE — Progress Notes (Signed)
Subjective:   Today, patient continues to remain less responsive with her eyes shut and mumbles only; however, mental status slightly improved from latter portion of examination yesterday. Still not at her baseline. She notes her left ankle hurts when it is moved, but shakes her head no when asked if she has had trouble swallowing or shortness of breath. She mumbles yes when asked if she is okay.   Objective:  Vital signs in last 24 hours: Vitals:   11/22/19 0715 11/22/19 0843 11/22/19 1037 11/22/19 1404  BP: (!) 159/94  (!) 154/90   Pulse: (!) 111  (!) 108   Resp: (!) 24  20   Temp:   98.9 F (37.2 C)   TempSrc:   Oral   SpO2: 97% 97% 98% 98%  Weight:      Height:       General: Patient appears chronically ill, in no acute distress.  Eyes: Bilateral cataracts, PERRLA bilaterally.  Respiratory: Diffuse rhonchi and rales present. Upper airway sounds congested on breathing. Minimal increased work of breathing on 2L [place]. Mild supraclavicular retractions.   Cardiovascular: Rate is borderline tachycardic. Rhythm is regular. No murmurs, rubs, or gallops. No lower extremity pitting edema. Mild, improved JVD Abdominal: Soft and non-tender to palpation. Bowel sounds intact. No rebound or guarding. Neurological: There are resting tremors of the left shoulder and active tremors in bilateral hands, worse on the left side. Patient is lethargic and minimally responsive with mumbling only on examination. Retracts to pain upon ankle dorsiflexion. Musculoskeletal: Diffuse joint enlargement consistent with osteoarthritis. Decreased active and passive ROM of all four extremities with tenderness to dorsiflexion of the LLE.  Skin: No lesions. No rashes.   Assessment/Plan:  Principal Problem:   HCAP (healthcare-associated pneumonia) Active Problems:   Chronic diastolic CHF (congestive heart failure) (HCC)   Vascular dementia without behavioral disturbance (HCC)   Dementia (HCC)   Protein calorie  malnutrition ([place])   Severe sepsis ([place])   This is a 84 year old female, with past medical history significant for dementia, HTN, diastolic heart failure, seizure disorder (on Depakote) oropharyngeal dysphagia, who presented from skilled nursing facility for worsening of shortness of breath, hypoxia at 80s on room air and and temperature of 100.8 at SNF.  She was tachycardic, tachypneic with leukopenia and extremely elevated lactic acid: 8.1 and procalcitonin of 18.56 on arrival.  Chest x-ray suggestive of multifocal pneumonia, R>L likely secondary to aspiration.  Patient started on IV fluids, IV antibiotic for sepsis secondary to HCAP, however due to acute hypoxic respiratory distress, PCCM was consulted and patient admitted in ICU.  She did not require intubation.. Now on 2L with improved work of breathing and LE edema.   Acute hypoxic resp failure and Sepsis 2/2 multifocal PNA R>L: In setting of sepsis and volume overload after IV hydration. Did not require intubation. On arrival: Had leukopenia, WBC 2.5K, lactic acidosis 8.1, respiration rate of 34, chest x-ray consistent with multifocal pneumonia. Received IV vancomycin, levofloxacin and metronidazole x 1 day. Now on cefepime since 9/25. No leukocytosis. Repeat CXR today shows improved multifocal pneumonia without significant pulmonary edema.  -Legionella serotype 1 urine antigen is negative -Final blood cultures negative -Final urine cultures negative -Expectorated sputum unable to be tested  -Currently on 2L [place] -Continue cefepime. (Started 9/25). -Supplemental oxygen to keep O2 saturation more than 92% -Respiratory therapy and flutter valve -Continue levalbuterol q6 hours and albuterol PRN  -Appears euvolemic on exam, holding additional lasix -will repeat procalcitonin; if normalized, will discontinue  ABX; if remains elevated or worsening, will likely switch ABX   Moderate Diffuse Encephalopathy with Tremor and Hx of Seizure  Disorder: Patient has experienced intermittent episodes of decreased arousal and responsiveness with resting tremor vs. Myoclonus, concerning for partial seizure vs. worsening respiratory status vs. Cefepime effect; seems to be progressing. Nursing now report only responsive to painful stimuli. EEG performed 11/21/19 and repeated 11/22/19 showed moderate diffuse encephalopathy (possibly toxic-metabolic in nature) without active seizures or definite epileptiform discharges. Patient takes Depakote 250mg  BID at home and is on valproate IV here. Ammonia within normal limits.  - Plan to consult neurology for further recommendations - Checking valproate level  - Will check overnight EEG with video  - Given decreased urine output, will check bladder scan - consider respiratory suctioning if dysphagia worsens  - Check Head CT without contrast  Electrolyte abnormalities: Electrolytes stable today.  - Continue to replace as needed for Mg > 2, K > 4 - Daily monitoring   Dysphagia: This has been a chronic problem. Modified barium swallow study performed 9/25, recommended dysphagia 2 diet with medications administered whole in pure.  Mild aspiration risk. Patient does appear to have increased dysphagia with more congested breathing today.  -Continue dysphagia diet -consider need for NG tube if worsening -SLP will reassess this afternoon  Tachycardia: Intermittent episodes of SVT vs VT, non-sustained. Previously had elevated troponins. No chest pain. EKG showed LBBB, unchanged from prior. Some hypokalemia as above,repleted  -Goal K > 4, Mg > 2 -Continue telemetry -Consider amiodarone if sustained Vtach, holding BB due to concerns for bronchial obstruction.   HFpEF: Echo 11/17/2019 showed EF of 60-65%, right ventricular pressure and volume overload.  Without regional wall motion abnormalities. Prior echo on 08/28/2018 with EF of 60-65% and moderate concentric LV hypertrophy. Euvolemic on exam,  continuing to hold diuretics.   -Monitor volume status closely -Strict I&O's  Metabolic Alkalosis  VBG shows pH 7.518, pCO2 40.2, pO2 <31, and Bicarb 32.5.  Prior VBG showed respiratory acidosis; alkalosis likely 2/2 recent diuresis and compensation for respiratory acidosis.  - Will repeat VBG   Prior to Admission Living Arrangement: SNF Anticipated Discharge Location: SNF Barriers to Discharge: Workup of Altered Mental Status; Multifocal PNA treatment Jeralyn Bennett, MD 11/22/2019, 2:50 PM Pager: [phone] After 5pm on weekdays and 1pm on weekends: On Call pager [phone]

## 2019-11-22 NOTE — Progress Notes (Signed)
RT holding CPT at this time. RN aware. RT will continue to monitor.

## 2019-11-22 NOTE — Procedures (Addendum)
Patient Name: Nicole Blackwell  MRN: [id number]  Epilepsy Attending: Lora Havens  Referring Physician/Provider: Dr Edison Simon Date: 11/22/2019 Duration: 27.14 mins  Patient history: Awake  AEDs during EEG study: Valproate  Technical aspects: This EEG study was done with scalp electrodes positioned according to the 10-20 International system of electrode placement. Electrical activity was acquired at a sampling rate of 500Hz  and reviewed with a high frequency filter of 70Hz  and a low frequency filter of 1Hz . EEG data were recorded continuously and digitally stored.   Description: No posterior dominant rhythm was seen.  EEG showed continuous generalized 3-5Hz  theta-delta slowing.  Triphasic waves, generalized were also noted.  Hyperventilation and photic stimulation were not performed.     ABNORMALITY -Continuous slow, generalized -Triphasic waves, generalized  IMPRESSION: This study is suggestive of moderate diffuse encephalopathy, nonspecific etiology but could be secondary to toxic-metabolic causes. No seizures or definite epileptiform discharges were seen throughout the recording.  EEG appears similar to previous day  [place]

## 2019-11-22 NOTE — Progress Notes (Signed)
SLP Cancellation Note  Patient Details Name: Nicole Blackwell MRN: [id number] DOB: Nov 11, 1935   Cancelled treatment:       Reason Eval/Treat Not Completed: Patient at procedure or test/unavailable (Pt being set up for EEG. SLP will f/u)  [name] I. Hardin Negus, [place], [place] Office number [phone] Pager [place] 11/22/2019, 11:47 AM

## 2019-11-22 NOTE — Consult Note (Signed)
Neurology Consultation Reason for Consult: Altered mental status Referring Physician: Dr. Lenice Pressman  CC: Altered mental status  History is obtained from: Chart review as patient is confused and unable to provide history  HPI: Nicole Blackwell is a 84 y.o. female with history of hypertension, epilepsy on Depakote, vitamin B12 deficiency, congestive heart failure who presented on 11/17/2019 with shortness of breath and was found to have sepsis secondary to multifocal HCAP.  Patient was started on IV antibiotics.  Initially patient was able to follow commands per critical care note on 11/17/2019 and medicine notes on 11/18/2019.  However this gradually become more more altered and therefore neurology was consulted for further management.  Per RN at bedside, patient mental status is gradually worsened.  Yesterday she was able to open her eyes with normal verbal or tactile stimuli but is requiring more noxious stimuli today.  RN has not noticed any seizure-like activity.  Per RN, patient's daughter had reported staring spells as seizure semiology in the past. I called patient's daughter Nicole Blackwell who agrees that patient has been having staring spells since last year and was told that these are seizures.  After she was started on valproic acid these "seizures" seizures only happened" once in a blue moon".  Daughter denies any grand mal seizures.  ROS:  Unable to obtain due to altered mental status.   Past Medical History:  Diagnosis Date  . BACK PAIN   . Dementia ([place])   . Hypertension   . OSTEOPOROSIS   . PLANTAR FASCIITIS   . Positive PPD   . Seizures ([place])   . SHINGLES   . SYMPTOMATIC MENOPAUSAL/FEMALE CLIMACTERIC STATES   . Syncope and collapse 12/13/2014  . Uterine cancer ([place])     Family History  Problem Relation Age of Onset  . Healthy Mother   . Healthy Father     Social History: Per chart review she has never smoked. She has never used smokeless tobacco. She reports that she does  not drink alcohol and does not use drugs.   Exam: Current vital signs: BP (!) 154/90 (BP Location: Right Arm)   Pulse (!) 108   Temp 98.9 F (37.2 C) (Oral)   Resp 20   Ht 5\' 5"  (1.651 m)   Wt 55.2 kg   SpO2 98%   BMI 20.25 kg/m  Vital signs in last 24 hours: Temp:  [97.8 F (36.6 C)-98.9 F (37.2 C)] 98.9 F (37.2 C) (09/29 1037) Pulse Rate:  [97-113] 108 (09/29 1037) Resp:  [20-30] 20 (09/29 1037) BP: (152-180)/(81-96) 154/90 (09/29 1037) SpO2:  [90 %-99 %] 98 % (09/29 1404) Weight:  [55.2 kg] 55.2 kg (09/29 0347)   Physical Exam  Constitutional: Laying, appears cachectic Psych: Difficult to assess due to altered mental status Eyes: No scleral injection HENT: No OP obstrucion Head: Normocephalic.  Cardiovascular: Normal rate and regular rhythm.  Respiratory: Effort normal, non-labored breathing GI: Soft.  No distension. There is no tenderness.  Skin: Warm, no apparent ulcers Neuro: Sitting in bed with eyes closed, only opens eyes to noxious stimuli, does not follow commands, pupils equally round and reactive, no forced gaze deviation, no apparent facial asymmetry, spontaneously moving all 4 extremities  I have reviewed labs in epic and the results pertinent to this consultation are: Valproic acid level 36 Ammonia normal Total protein 5.9 mm Albumin 2.2 Calcium 8.5 Rare bacteria on urinalysis Magnesium 2.6  I have reviewed the images obtained: CT head without contrast 11/22/2019: No acute intracranial abnormality  ASSESSMENT/PLAN: 84 year old female with advanced dementia admitted for shortness of breath currently on treatment for sepsis with HCAP.  Continues to have worsening mental status.  Therefore neurology was consulted.   Acute encephalopathy, suspected infectious/toxic metabolic Epilepsy Hypoproteinemia with hypoalbuminemia Hypomagnesemia Subtherapeutic valproic acid level HCAP -Routine EEG showed triphasic waves suggestive of toxic-metabolic  etiology. CT head did not show any abnormality -Differentials at this point include intermittent seizures versus worsening encephalopathy secondary to cefepime toxicity versus encephalopathy due to HCAP in an elderly demented female   Recommendations: -Long-term EEG to look for intermittent seizures -We will load patient with valproic acid 1000 mg and increase maintenance to 250 mg every 8 hours -We will check valproic acid level before 6 AM dose tomorrow -We will discuss with ID to switch cefepime to a different antibiotic if possible -Continue seizure precautions -As needed IV Ativan 1 mg for clinical seizure-like activity -Management of rest of comorbidities per primary team  I have spent a total of  75 minutes with the patient reviewing hospital notes,  test results, labs and examining the patient as well as establishing an assessment and plan that was discussed personally with the patient' s daughter and team.  > 50% of time was spent in direct patient care.    Zeb Comfort Epilepsy [facility]

## 2019-11-22 NOTE — Progress Notes (Addendum)
MD was informed by RN that patient's MEW was red with a BP of 177/90, HR of 114, RR of 30 and congested lung sounds but no acute change on assessment.  MD asked RN to get an EKG. MD went to the bedside to assess patient. On assessment, patient was sleepy and kept eyes closed. She denied any chest pain, or shortness of breath and when asked if she was hurting anywhere else, patient mumbled "no".   A/P Patient being treated for acute hypoxic respiratory failure and sepsis due to multifocal pneumonia. On exam, patient was somnolent, mumbled her answers with eyes closed. Sats of 94% on room air. Lungs with coarse rhonchi and congested sounds. No wheezes. Tachypneic with mild increase in work of breathing but no accessory muscle use. Tachycardic but normal rhythm. No M/R/G. EKG showed sinus tach but no significant change from previous EKG. Patient in no acute distress at the moment. On cefepime, remains afebrile. Will continue to monitor, follow-up morning labs and repeat chest x-ray in the morning.

## 2019-11-22 NOTE — Progress Notes (Signed)
Patient's MEWS turned Red at 09/28 @2300 . Patient's BP 176/87 and HR 114 with RR of 30. Patient very congested and non interactive as usual. MD made aware. Received order to get an EKG. MD at bedside. MD suggested to monitor the patient closely and report any changes. Will continue to monitor the patient.

## 2019-11-22 NOTE — Progress Notes (Signed)
   11/21/19 2300  Assess: MEWS Score  Temp 98.9 F (37.2 C)  BP (!) 176/87  Pulse Rate (!) 113  ECG Heart Rate (!) 114  Resp (!) 30  Level of Consciousness Responds to Voice  SpO2 94 %  O2 Device Room Air  Patient Activity (if Appropriate) In bed  Assess: MEWS Score  MEWS Temp 0  MEWS Systolic 0  MEWS Pulse 2  MEWS RR 2  MEWS LOC 1  MEWS Score 5  MEWS Score Color Red  Assess: if the MEWS score is Yellow or Red  Were vital signs taken at a resting state? Yes  Focused Assessment No change from prior assessment  Early Detection of Sepsis Score *See Row Information* Low  MEWS guidelines implemented *See Row Information* Yes  Treat  MEWS Interventions Escalated (See documentation below)  Pain Scale 0-10  Pain Score Asleep  Faces Pain Scale 0  Take Vital Signs  Increase Vital Sign Frequency  Red: Q 1hr X 4 then Q 4hr X 4, if remains red, continue Q 4hrs  Escalate  MEWS: Escalate Red: discuss with charge nurse/RN and provider, consider discussing with RRT  Notify: Charge Nurse/RN  Name of Charge Nurse/RN Notified [place], RN  Date Charge Nurse/RN Notified 11/21/19  Time Charge Nurse/RN Notified 2305  Notify: Provider  Provider Name/Title Dr. Steele Sizer  Date Provider Notified 11/21/19  Time Provider Notified 2305  Notification Type Page  Notification Reason Change in status (increased BP and HR)  Response No new orders;Other (Comment) (Ekg performed, MD at bedside)  Date of Provider Response 11/21/19  Time of Provider Response 2310  Document  Patient Outcome Other (Comment) (remains on department)  Progress note created (see row info) Yes

## 2019-11-23 ENCOUNTER — Inpatient Hospital Stay (HOSPITAL_COMMUNITY): Payer: Medicare (Managed Care)

## 2019-11-23 DIAGNOSIS — F0390 Unspecified dementia without behavioral disturbance: Secondary | ICD-10-CM

## 2019-11-23 LAB — RESPIRATORY PANEL BY RT PCR (FLU A&B, COVID)
Influenza A by PCR: NEGATIVE
Influenza B by PCR: NEGATIVE
SARS Coronavirus 2 by RT PCR: NEGATIVE

## 2019-11-23 LAB — BLOOD GAS, VENOUS
Acid-Base Excess: 6.9 mmol/L — ABNORMAL HIGH (ref 0.0–2.0)
Bicarbonate: 30.4 mmol/L — ABNORMAL HIGH (ref 20.0–28.0)
FIO2: 21
O2 Saturation: 98.5 %
Patient temperature: 37
pCO2, Ven: 38.8 mmHg — ABNORMAL LOW (ref 44.0–60.0)
pH, Ven: 7.505 — ABNORMAL HIGH (ref 7.250–7.430)
pO2, Ven: 137 mmHg — ABNORMAL HIGH (ref 32.0–45.0)

## 2019-11-23 LAB — CBC WITH DIFFERENTIAL/PLATELET
Abs Immature Granulocytes: 0.24 10*3/uL — ABNORMAL HIGH (ref 0.00–0.07)
Basophils Absolute: 0 10*3/uL (ref 0.0–0.1)
Basophils Relative: 0 %
Eosinophils Absolute: 0 10*3/uL (ref 0.0–0.5)
Eosinophils Relative: 0 %
HCT: 42.1 % (ref 36.0–46.0)
Hemoglobin: 13.2 g/dL (ref 12.0–15.0)
Immature Granulocytes: 2 %
Lymphocytes Relative: 18 %
Lymphs Abs: 1.8 10*3/uL (ref 0.7–4.0)
MCH: 25.8 pg — ABNORMAL LOW (ref 26.0–34.0)
MCHC: 31.4 g/dL (ref 30.0–36.0)
MCV: 82.2 fL (ref 80.0–100.0)
Monocytes Absolute: 0.6 10*3/uL (ref 0.1–1.0)
Monocytes Relative: 6 %
Neutro Abs: 7.2 10*3/uL (ref 1.7–7.7)
Neutrophils Relative %: 74 %
Platelets: 274 10*3/uL (ref 150–400)
RBC: 5.12 MIL/uL — ABNORMAL HIGH (ref 3.87–5.11)
RDW: 14.9 % (ref 11.5–15.5)
WBC: 9.9 10*3/uL (ref 4.0–10.5)
nRBC: 0 % (ref 0.0–0.2)

## 2019-11-23 LAB — COMPREHENSIVE METABOLIC PANEL
ALT: 14 U/L (ref 0–44)
AST: 21 U/L (ref 15–41)
Albumin: 2.1 g/dL — ABNORMAL LOW (ref 3.5–5.0)
Alkaline Phosphatase: 54 U/L (ref 38–126)
Anion gap: 11 (ref 5–15)
BUN: 16 mg/dL (ref 8–23)
CO2: 30 mmol/L (ref 22–32)
Calcium: 8.4 mg/dL — ABNORMAL LOW (ref 8.9–10.3)
Chloride: 103 mmol/L (ref 98–111)
Creatinine, Ser: 0.76 mg/dL (ref 0.44–1.00)
GFR calc Af Amer: >60 mL/min (ref >60)
GFR calc non Af Amer: >60 mL/min (ref >60)
Glucose, Bld: 114 mg/dL — ABNORMAL HIGH (ref 70–99)
Potassium: 3.7 mmol/L (ref 3.5–5.1)
Sodium: 144 mmol/L (ref 135–145)
Total Bilirubin: 1.1 mg/dL (ref 0.3–1.2)
Total Protein: 5.8 g/dL — ABNORMAL LOW (ref 6.5–8.1)

## 2019-11-23 LAB — VALPROIC ACID LEVEL: Valproic Acid Lvl: 68 ug/mL (ref 50.0–100.0)

## 2019-11-23 IMAGING — MR MR HEAD WO/W CM
9 of 12 series · 35 of 48 positions shown · IV contrast (gadavist)
Comparison: CT head [DATE], MRI [DATE]

CLINICAL DATA: Seizure

EXAM:
MRI HEAD WITHOUT AND WITH CONTRAST
TECHNIQUE: Multiplanar, multiecho pulse sequences of the brain and surrounding
structures were obtained without and with intravenous contrast.
CONTRAST:  5mL GADAVIST GADOBUTROL 1 MMOL/ML IV SOLN

[Series 3: DWI · axial · 3.0mm · 1.09mm/px · z∈[-35,+98]mm · 9 of 92 slices shown (1 of 4)]
[im 1/92]
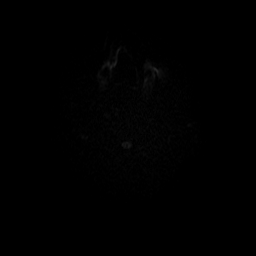
[im 12/92]
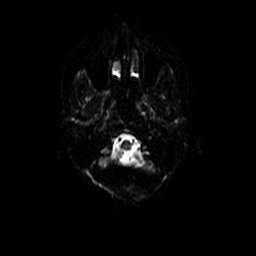
[im 23/92]
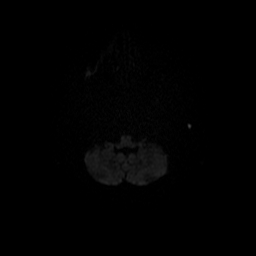
[im 35/92]
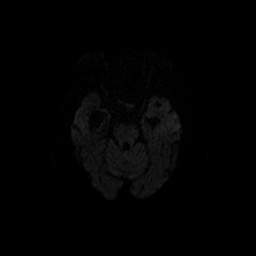
[im 46/92]
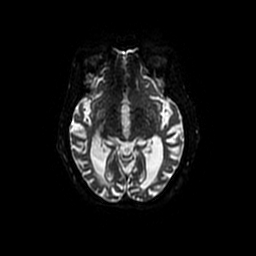
[im 57/92]
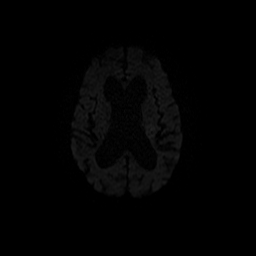
[im 69/92]
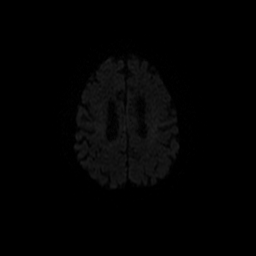
[im 80/92]
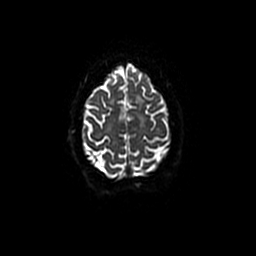
[im 92/92]
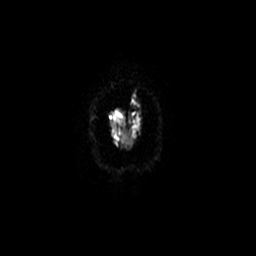

[Series 4: DWI · coronal · 5.0mm · 1.09mm/px · 7 of 74 slices shown (2 of 4)]
[im 1/74]
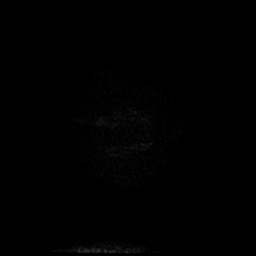
[im 13/74]
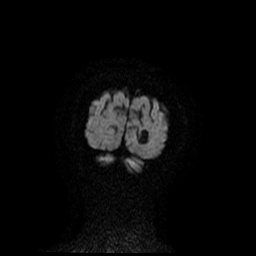
[im 25/74]
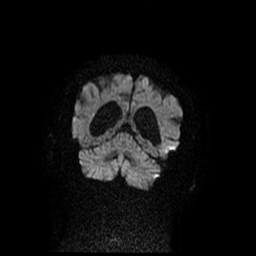
[im 37/74]
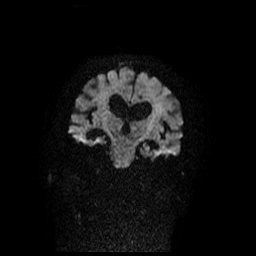
[im 49/74]
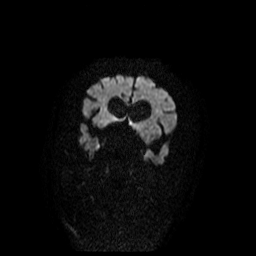
[im 61/74]
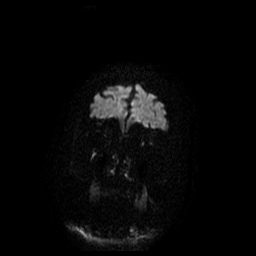
[im 74/74]
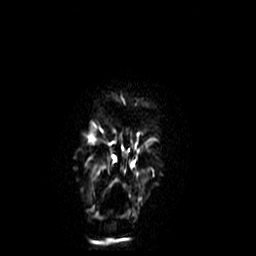

[Series 6: T2 · axial · 5.0mm · 0.43mm/px · z∈[-49,+87]mm · 2 of 24 slices shown]
[im 1/24]
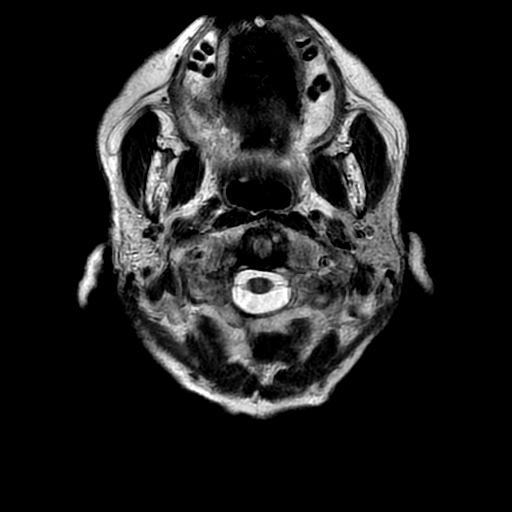
[im 24/24]
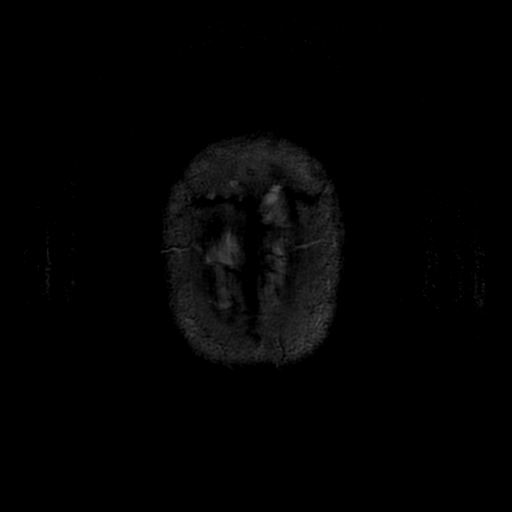

[Series 7: FLAIR · axial · 3.0mm · 0.43mm/px · z∈[-49,+87]mm · 2 of 24 slices shown]
[im 1/24]
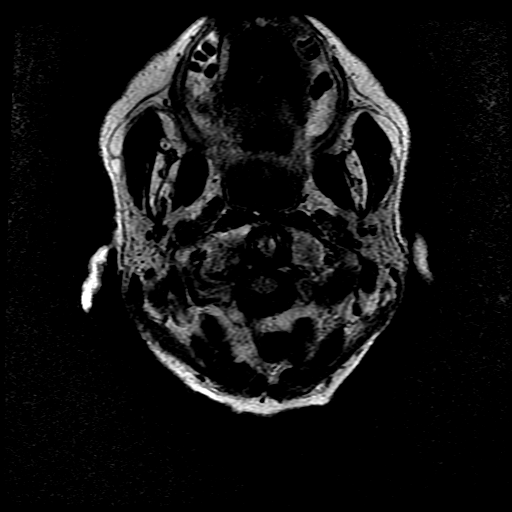
[im 24/24]
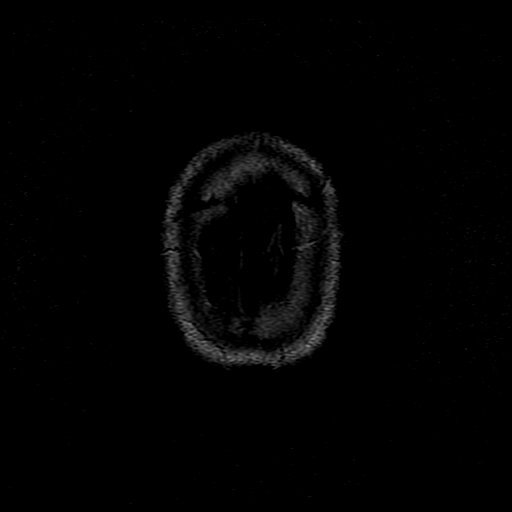

[Series 10: T2 post-contrast · coronal · 5.0mm · 0.39mm/px · 2 of 27 slices shown]
[im 1/27]
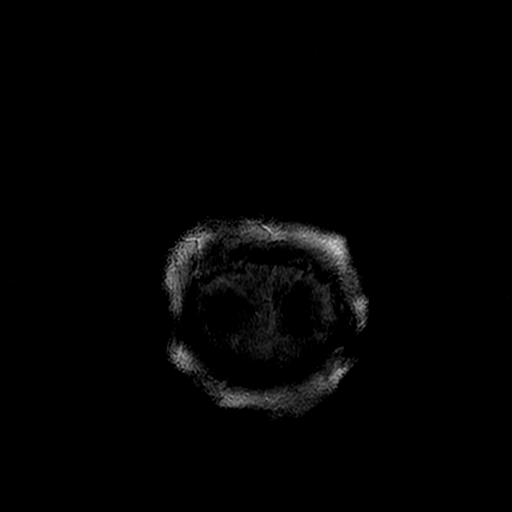
[im 27/27]
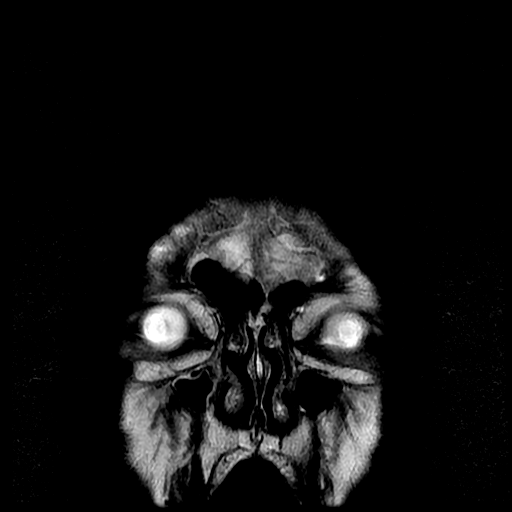

[Series 11: T1 post-contrast · axial · 3.0mm · 0.47mm/px · z∈[-40,+104]mm · 4 of 50 slices shown (1 of 2)]
[im 1/50]
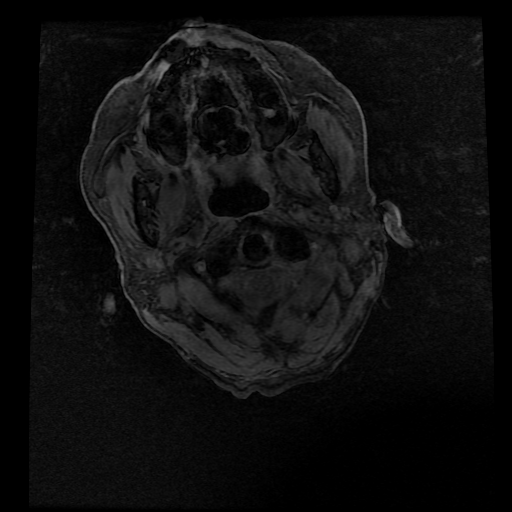
[im 17/50]
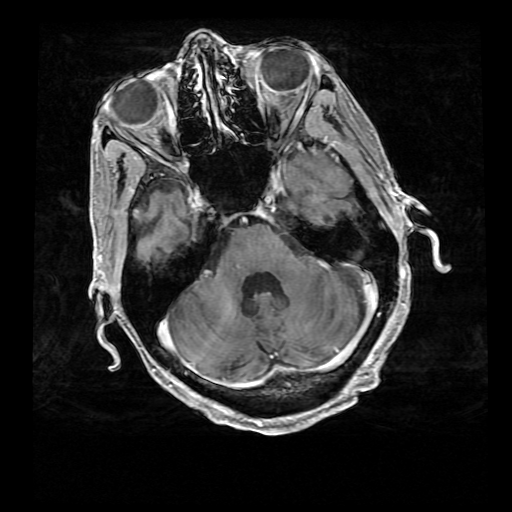
[im 33/50]
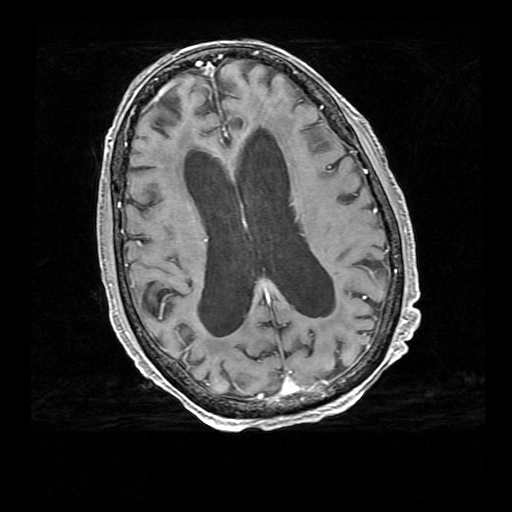
[im 50/50]
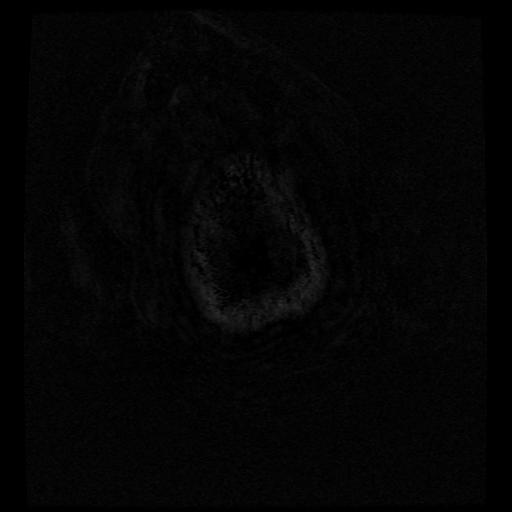

[Series 12: T1 post-contrast · coronal · 5.0mm · 0.39mm/px · 2 of 27 slices shown (2 of 2)]
[im 1/27]
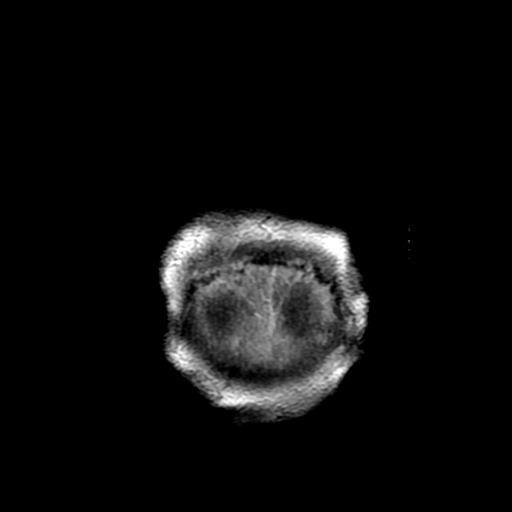
[im 27/27]
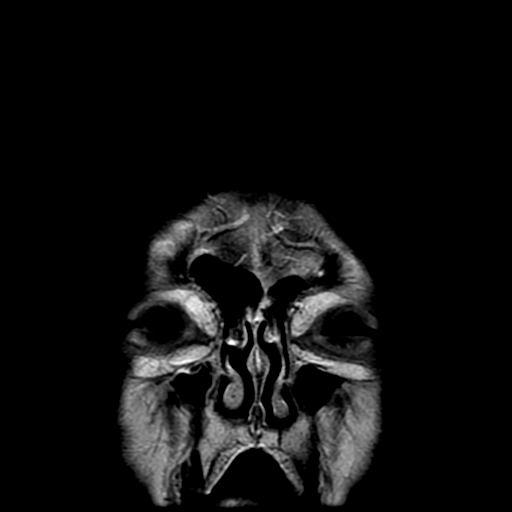

[Series 300: DWI · axial · 3.0mm · 1.09mm/px · z∈[-35,+98]mm · 4 of 46 slices shown (3 of 4)]
[im 1/46]
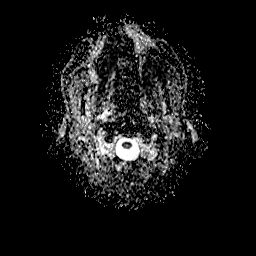
[im 16/46]
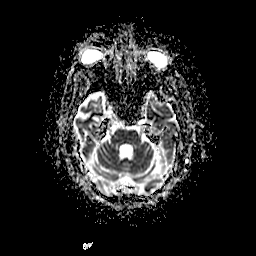
[im 31/46]
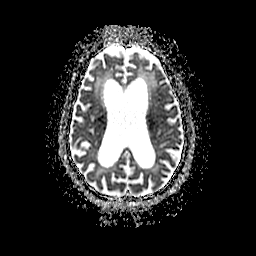
[im 46/46]
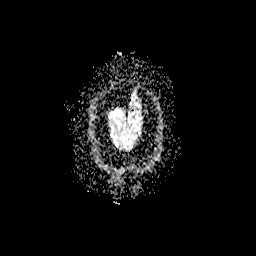

[Series 400: DWI · coronal · 5.0mm · 1.09mm/px · 3 of 37 slices shown (4 of 4)]
[im 1/37]
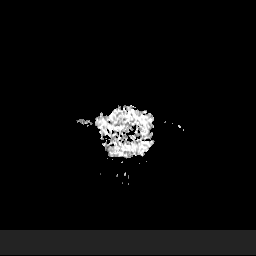
[im 19/37]
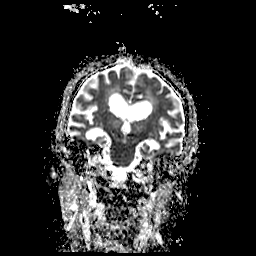
[im 37/37]
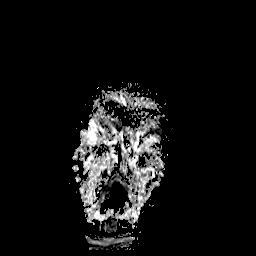

[35 of 48 positions shown; findings below may reference images not displayed]

FINDINGS: Brain: No acute infarct. No acute hemorrhage. Similar advanced
generalized cerebral volume loss with ex vacuo ventricular dilation.
No hydrocephalus. Similar confluent T2/FLAIR hyperintense white
matter changes, compatible with chronic microvascular ischemic
disease. Punctate foci of susceptibility artifact in the right
occipital lobe and left parietal lobe, compatible with prior
microhemorrhages. No abnormal enhancement.

Vascular: Major proximal arterial flow voids are maintained at the
skull base.

Skull and upper cervical spine: Normal marrow signal. Degenerative
changes.

Sinuses/Orbits: Clear sinuses.  No acute orbital abnormality.

Other: Small bilateral mastoid effusions.
IMPRESSION: 1. No acute intracranial abnormality.
2. Similar advanced cerebral volume loss with chronic microvascular
ischemic change.

## 2019-11-23 IMAGING — DX DG ABDOMEN 1V
2 series · 2 of 2 positions shown · non-contrast
Comparison: None.

CLINICAL DATA: Altered mental status.

EXAM:
ABDOMEN - 1 VIEW

[abdomen kub (1 of 2)]
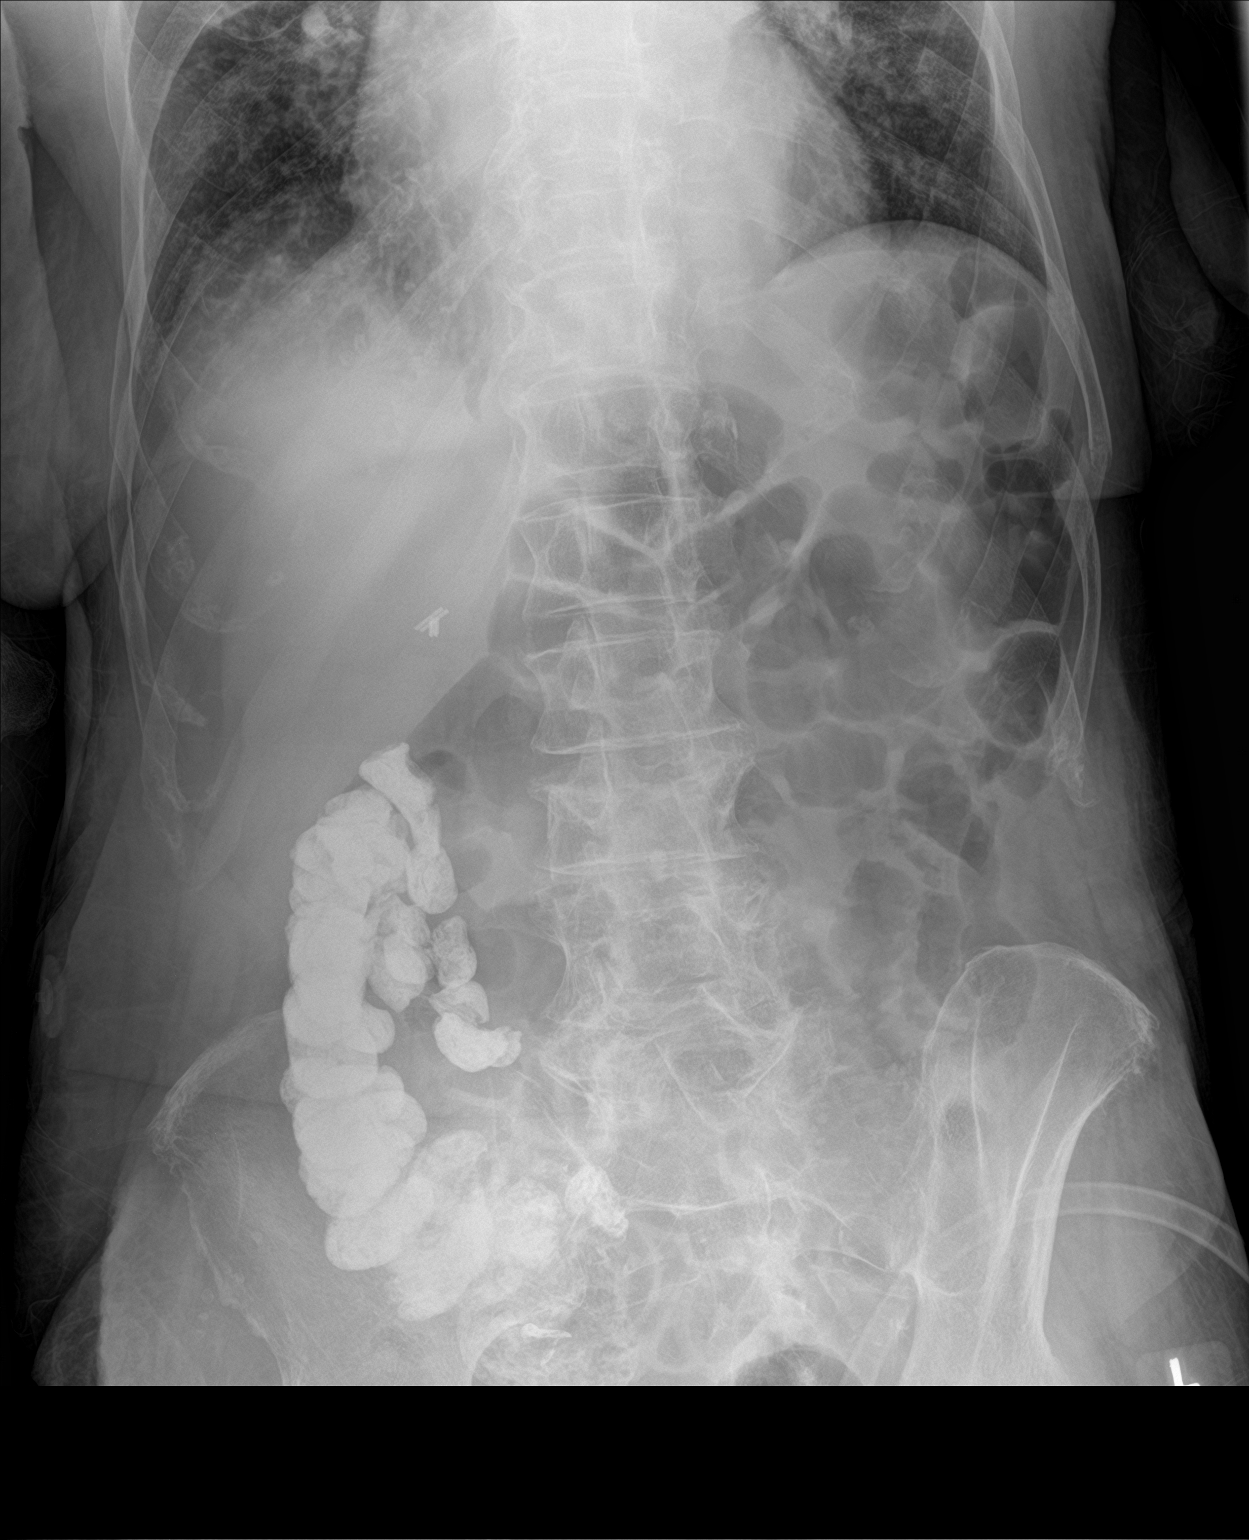

[abdomen kub (2 of 2)]
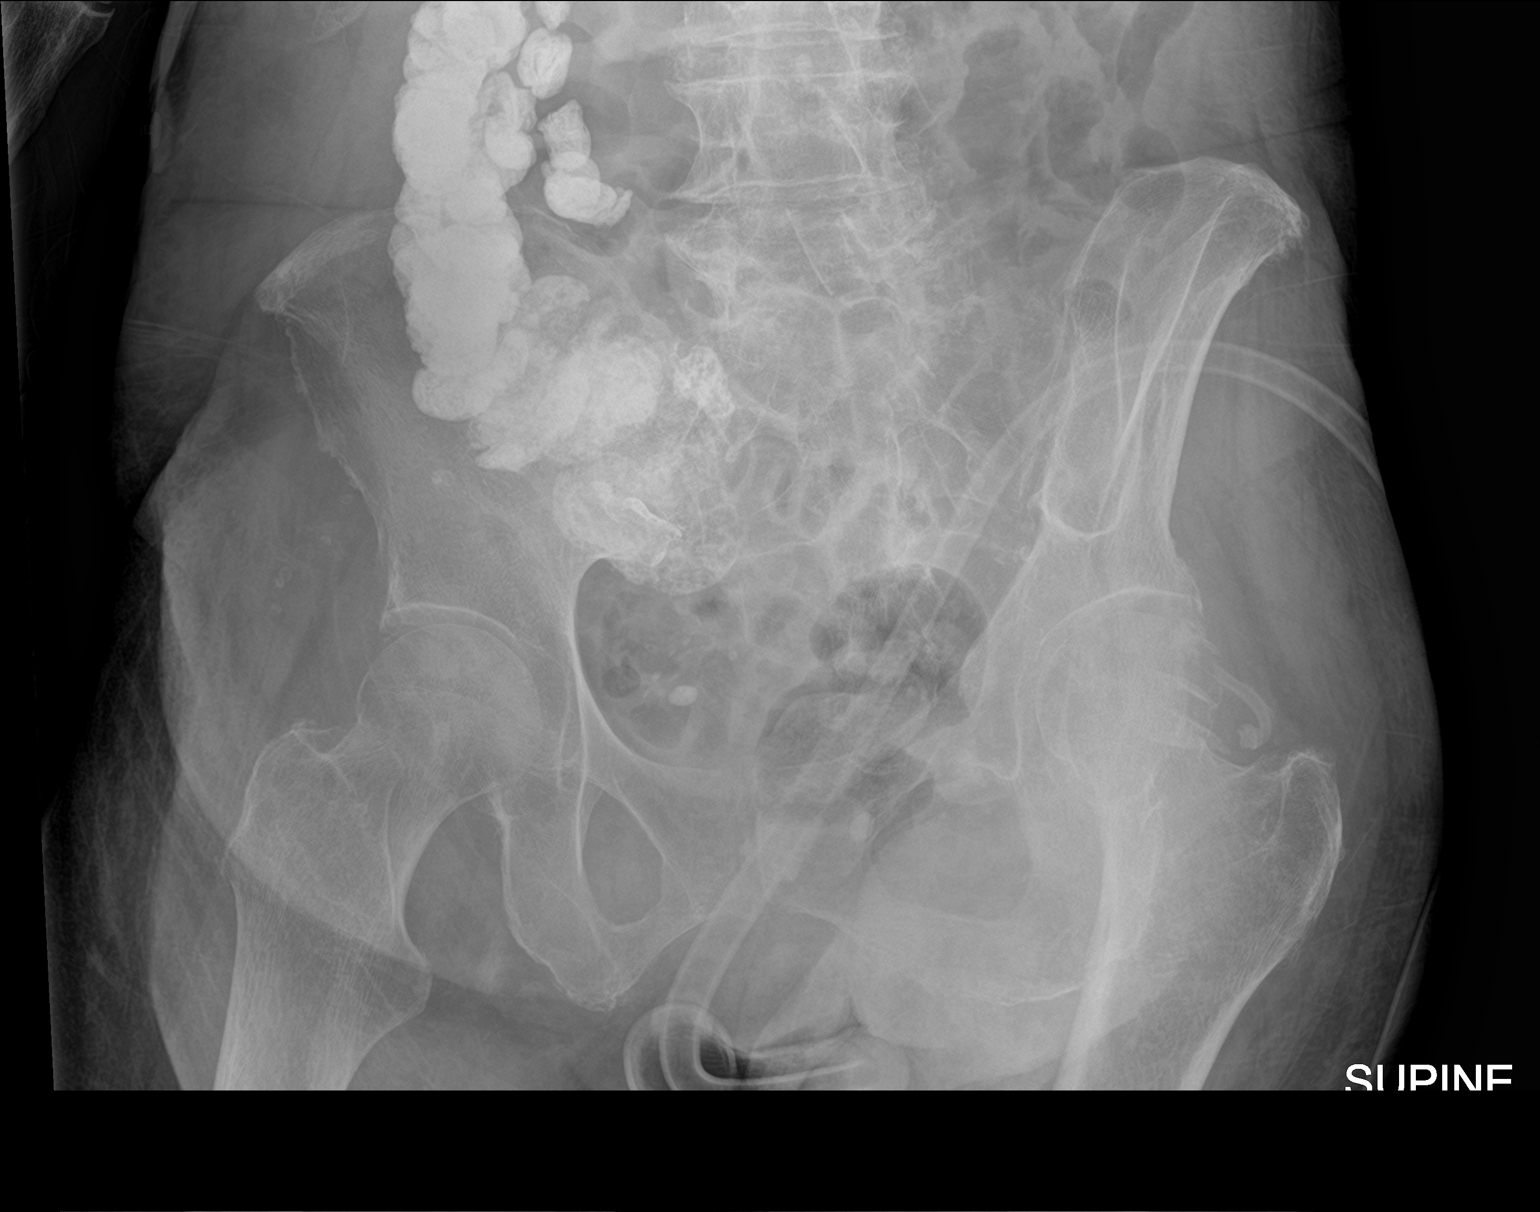

[2 of 2 positions shown; findings below may reference images not displayed]

FINDINGS: The bowel gas pattern is normal. Phlebolith is noted in the pelvis.
Status post cholecystectomy. Residual contrast seen in the right
colon.
IMPRESSION: No evidence of bowel obstruction or ileus.

## 2019-11-23 MED ORDER — ALBUTEROL SULFATE (2.5 MG/3ML) 0.083% IN NEBU
2.5000 mg | INHALATION_SOLUTION | RESPIRATORY_TRACT | Status: DC | PRN
  Administered 2019-11-25: 2.5 mg via RESPIRATORY_TRACT
  Filled 2019-11-23: qty 3

## 2019-11-23 MED ORDER — LOSARTAN POTASSIUM 25 MG PO TABS: 12.5000 mg | ORAL_TABLET | Freq: Every day | ORAL | Status: DC

## 2019-11-23 MED ORDER — GADOBUTROL 1 MMOL/ML IV SOLN
5.0000 mL | Freq: Once | INTRAVENOUS | Status: AC | PRN
  Administered 2019-11-23: 5 mL via INTRAVENOUS

## 2019-11-23 NOTE — Progress Notes (Addendum)
Subjective: Patient continues to be lethargic, only opening eyes to repeated tactile or noxious stimuli.   ROS: Unable to obtain due to poor mental status  Examination  Vital signs in last 24 hours: Temp:  [98.6 F (37 C)-100.3 F (37.9 C)] 99.3 F (37.4 C) (09/30 0700) Pulse Rate:  [99-116] 116 (09/30 0854) Resp:  [19-24] 23 (09/30 0854) BP: (137-170)/(74-96) 170/96 (09/30 0700) SpO2:  [92 %-98 %] 92 % (09/30 0854) Weight:  [54.9 kg] 54.9 kg (09/30 0604)  General: lying in bed, not in apparent distress CVS: pulse-normal rate and rhythm RS: breathing comfortably, coarse breath sounds bilaterally Extremities: normal, warm Neuro: Laying in bed with eyes closed, only opens eyes to repeated tactile or noxious stimuli, mumbling something but difficult to understand, does not follow commands, pupils equally round and reactive, no forced gaze deviation, no apparent facial asymmetry, spontaneously moving all 4 extremities, unable to assess for neck stiffness as patient resisting neck movement but does not appear to be in distress  Basic Metabolic Panel: Recent Labs  Lab 11/18/19 0144 11/18/19 0144 11/19/19 1009 11/19/19 1009 11/19/19 2104 11/19/19 2104 11/20/19 0518 11/21/19 0142 11/22/19 0230  NA 138   < > 139  --  137  --  138 139 141  K 3.4*   < > 2.8*  --  3.7  --  3.4* 4.0 4.0  CL 98   < > 100  --  96*  --  96* 100 101  CO2 29   < > 30  --  29  --  32 30 25  GLUCOSE 94   < > 101*  --  120*  --  99 81 104*  BUN 16   < > 12  --  8  --  9 10 19   CREATININE 0.85   < > 0.79  --  0.79  --  0.90 0.70 0.88  CALCIUM 8.1*   < > 8.3*   < > 8.6*   < > 8.0* 8.2* 8.5*  MG 1.7  --  1.9  --   --   --  1.9 2.6*  --   PHOS 1.9*  --  2.2*  --   --   --  3.4 2.7 3.6   < > = values in this interval not displayed.    CBC: Recent Labs  Lab 11/18/19 0144 11/19/19 1009 11/20/19 0518 11/21/19 0142 11/22/19 0230  WBC 6.9 8.6 7.7 6.7 8.2  NEUTROABS 4.5 6.5 5.0 3.7 5.6  HGB 12.2 13.0 12.7  13.9 13.8  HCT 39.4 41.4 39.8 43.5 43.8  MCV 83.8 82.6 81.6 83.0 83.3  PLT 162 194 180 180 255     Coagulation Studies: No results for input(s): LABPROT, INR in the last 72 hours.  Imaging MRI brain ordered and pending  ASSESSMENT AND PLAN: 84 year old female with advanced dementia admitted for shortness of breath currently on treatment for sepsis with HCAP.  Continues to have worsening mental status.  Therefore neurology was consulted.   Acute encephalopathy, suspected infectious/toxic metabolic Epilepsy Hypertension -Routine EEG showed triphasic waves suggestive of toxic-metabolic etiology. CT head did not show any abnormality -Differentials at this point include intermittent seizures versus worsening encephalopathy secondary to cefepime toxicity versus encephalopathy due to HCAP in an elderly demented female.  Low suspicion for meningitis/encephalitis as patient continues to be afebrile   Recommendations: -Continue LTM EEG to monitor for intermittent seizures for another 24 hours.  Will discontinue tomorrow if no improvement. -Valproic acid level 68 this  morning, continue valproic acid 250 mg every 8 hours -We will obtain MRI brain with and without contrast to look for any acute abnormality -We will discuss management of hypertension with primary team. -Continue seizure precautions, delirium precautions, avoid sedating medications -As needed IV Ativan 1 mg for clinical seizure-like activity -Management of rest of comorbidities per primary team  I have spent a total of 35 minuteswith the patient reviewing hospitalnotes,  test results, labs and examining the patient as well as establishing an assessment and plan that was discussed personally with the patient' s team.>50% of time was spent in direct patient care.    Zeb Comfort Epilepsy [facility] For questions after 5pm please refer to AMION to reach the Neurologist on call

## 2019-11-23 NOTE — Plan of Care (Signed)

## 2019-11-23 NOTE — Progress Notes (Signed)
LTM stopped, electrodes removed due to pt going for MRI.

## 2019-11-23 NOTE — Progress Notes (Signed)
Patient's axillary temperature of 100.3 F. Patient responding to pain stimuli only. MD made aware. Will continue to monitor.

## 2019-11-23 NOTE — Progress Notes (Signed)
  Speech Language Pathology Treatment: Dysphagia  Patient Details Name: Nicole Blackwell MRN: [id number] DOB: 06/02/1935 Today's Date: 11/23/2019 Time: 8676-1950 SLP Time Calculation (min) (ACUTE ONLY): 9 min  Assessment / Plan / Recommendation Clinical Impression  Pt was seen for dysphagia treatment. She was vocalizing upon the SLP's entry and a wet vocal quality was noted. Pt responded "yes" when she was addressed by her name but did not otherwise communicate verbally. Level of alertness was sub-optimal but was adequate for assessment. Pt continues to demonstrate significantly impaired bolus awareness and absent bolus manipulation. She therefore continues to be at high risk for aspiration before deglutition. It is recommended that the pt's NPO status be maintained and that short-term non-oral alimentation be initiated if this is in alignment with the pt/family's goals of care.    HPI HPI: Nicole Blackwell is a 84 y.o. female with medical history significant for dementia with no behavioral disturbances, essential hypertension, chronic diastolic CHF, seizure disorder on Depakote, oropharyngeal dysphagia, who presented from SNF due to increasing shortness of breath. MBS 9/25: mild oropharyngeal dysphagia c/b trace, transient laryngeal penetration of thin liquid x1 on examination. EEG 9/29: moderate diffuse encephalopathy, nonspecific etiology but could be secondary to toxic-metabolic causes.      SLP Plan  Continue with current plan of care       Recommendations  Diet recommendations: NPO Medication Administration: Via alternative means                Oral Care Recommendations: Oral care QID;Staff/trained caregiver to provide oral care Follow up Recommendations: Skilled Nursing facility SLP Visit Diagnosis: Dysphagia, unspecified (R13.10) Plan: Continue with current plan of care       Byrdie Miyazaki I. Hardin Negus, [place], [place] Office number [phone] Pager  [place] 11/23/2019, 9:32 AM

## 2019-11-23 NOTE — Procedures (Signed)
Patient Name: Nicole Blackwell  MRN: [id number]  Epilepsy Attending: Lora Havens  Referring Physician/Provider: Dr Zeb Comfort Duration: 11/22/2019 1118 to 11/23/2019 0953  Patient history: Awake  AEDs during EEG study:Valproate  Technical aspects: This EEG study was done with scalp electrodes positioned according to the 10-20 International system of electrode placement. Electrical activity was acquired at a sampling rate of 500Hz  and reviewed with a high frequency filter of 70Hz  and a low frequency filter of 1Hz . EEG data were recorded continuously and digitally stored.   Description:No posterior dominant rhythm was seen. EEG showed continuous generalized 3-5Hz theta-delta slowing. Triphasic waves, generalized were also noted.Hyperventilation and photic stimulation were not performed.   ABNORMALITY -Continuous slow, generalized -Triphasic waves, generalized  IMPRESSION: This study issuggestive of moderate diffuse encephalopathy, nonspecific etiology but could be secondary to toxic-metabolic causes. No seizures ordefiniteepileptiform discharges were seen throughout the recording.  EEG appears similar to previous day  [place]

## 2019-11-23 NOTE — Progress Notes (Signed)
Subjective:   Ms. Hartley is slightly more responsive than yesterday, able to mumble yes and no and track [place] while speaking to her. Nursing report that she has remained less responsive overnight into this morning, at times only grimacing to pain. Her daughter stated yesterday that she was aware of her mother's staring spells but said they had not occurred so frequently in the past. Nursing report some bleeding this afternoon from nares following COVID-19 testing.  Unable to obtain further history / ROS given patient's nonverbal status.   Objective:  Vital signs in last 24 hours: Vitals:   11/23/19 0700 11/23/19 0854 11/23/19 1100 11/23/19 1211  BP: (!) 170/96  (!) 164/86   Pulse: (!) 106 (!) 116 (!) 103   Resp: (!) 22 (!) 23 (!) 23   Temp: 99.3 F (37.4 C)  99.9 F (37.7 C)   TempSrc: Axillary  Axillary   SpO2: 97% 92% 97% 97%  Weight:      Height:       General: Patient feels warm to the touch and appears tired and chronically ill, although in no acute distress.  Eyes: PERRLA bilaterally.  Respiratory: Diffuse rhonchi and rales present. Upper airway sounds congested on breathing. Some increased work of breathing on 2L [place], although overall breathing has improved since yesterday.   Cardiovascular: Rate is tachycardic. Rhythm is regular. Heart sounds difficult to appreciate, although no murmurs, rubs, or gallops noted. No lower extremity pitting edema. Mild, improved JVD Abdominal: Soft and non-tender to palpation. Bowel sounds intact. No rebound or guarding. Neurological: Resting tremors in arms and face improved since yesterday. Patient is somnolent and minimally verbally responsive with mumbling only on examination.  Musculoskeletal: Diffuse joint enlargement consistent with osteoarthritis. Decreased active and passive ROM of all four extremities with tenderness to movement of the upper and lower extremities diffusely. Skin: No lesions. No rashes noted.  Assessment/Plan:  Principal  Problem:   HCAP (healthcare-associated pneumonia) Active Problems:   Chronic diastolic CHF (congestive heart failure) (HCC)   Vascular dementia without behavioral disturbance (HCC)   Dementia (HCC)   Protein calorie malnutrition ([place])   Severe sepsis ([place])   This is a 84 year old female, with past medical history significant for dementia, HTN, diastolic heart failure, seizure disorder (on Depakote) oropharyngeal dysphagia, who presented from skilled nursing facility for worsening of shortness of breath, hypoxia at 80s on room air and and temperature of 100.8 at SNF.  She was tachycardic, tachypneic with leukopenia and extremely elevated lactic acid: 8.1 and procalcitonin of 18.56 on arrival.  Chest x-ray suggestive of multifocal pneumonia, R>L likely secondary to aspiration.  Patient started on IV fluids, IV antibiotic for sepsis secondary to HCAP, however due to acute hypoxic respiratory distress, PCCM was consulted and patient admitted in ICU.  She did not require intubation.. Now on 2L with improved work of breathing and LE edema despite deterioration in mental status.   Acute hypoxic resp failure and Sepsis 2/2 multifocal PNA R>L: In setting of sepsis and volume overload after IV hydration. Did not require intubation. On arrival: Had leukopenia, WBC 2.5K, lactic acidosis 8.1, respiration rate of 34, chest x-ray consistent with multifocal pneumonia. Received IV vancomycin, levofloxacin and metronidazole x 1 day followed by 5 days of cefepime. No leukocytosis. Repeat CXR 11/22/19 showed improved multifocal pneumonia without significant pulmonary edema. Breath sounds improved on physical exam although continues to have difficulty clearing secretions with TMax last 24 hours 100.3. -Legionella serotype 1 urine antigen negative -Final blood cultures negative -  Final urine cultures negative -Initial expectorated sputum unable to be tested   -Currently on 2L [place] -Cefepime discontinued overnight due  to AMS and normal range procalcitonin -Repeating sputum culture and respiratory PCR panel -Ordered COVID-19 testing despite previous negative test given CXR findings and worsening clinical picture -Respiratory therapy and flutter valve -Patient made NPO to prevent atelectasis  -Continue levalbuterol q6 hours and albuterol PRN  -Appears euvolemic on exam, holding additional lasix -If patient develops temperature >100.4 degrees, will repeat blood cultures and U/A  Moderate Diffuse Encephalopathy with Tremor and Hx of Seizure Disorder: Patient has experienced intermittent episodes of decreased arousal and responsiveness with resting tremor vs. Myoclonus, concerning for partial seizure vs. worsening respiratory status vs. Cefepime effect; seems to be improving gradually this morning; however, nursing note she is intermittently only grimacing to pain. EEG performed 11/21/19 and repeated 11/22/19 showed moderate diffuse encephalopathy (possibly toxic-metabolic in nature) without active seizures or definite epileptiform discharges. Patient takes Depakote 250mg  BID at home and is on valproate IV here. Ammonia within normal limits. Head CT and MRI unremarkable. - Neurology following, appreciate their recommendations - Valproate levels low, given 1000mg  loading dose and increased home dose to 250mg  three times daily - Bladder scan without urinary retention and continued low-volume urine output today - Continue seizure precautions  Electrolyte abnormalities: Electrolytes stable today.  - Continue to replace as needed for Mg > 2, K > 4 - Daily monitoring   Dysphagia: This has been a chronic problem. Modified barium swallow study performed 9/25, recommended dysphagia 2 diet with medications administered whole in pure.  Mild aspiration risk. Patient does appear to have increased dysphagia with more congested breathing today.  -consider need for NG tube if worsening -SLP on board -Continue  NPO  Tachycardia: Intermittent episodes of SVT vs VT, non-sustained. Previously had elevated troponins. No chest pain. EKG showed LBBB, unchanged from prior. Some hypokalemia as above,repleted  -Goal K > 4, Mg > 2 -Continue telemetry -Consider amiodarone if sustained Vtach, holding BB due to concerns for bronchial obstruction.   HFpEF: Echo 11/17/2019 showed EF of 60-65%, right ventricular pressure and volume overload.  Without regional wall motion abnormalities. Prior echo on 08/28/2018 with EF of 60-65% and moderate concentric LV hypertrophy. Euvolemic on exam, continuing to hold diuretics.   -Monitor volume status closely -Strict I&O's  Metabolic Alkalosis  Initial VBG showed pH 7.518, pCO2 40.2, pO2 <31, and Bicarb 32.5. Repeat VBG 9/30 showed pH 7.505, pCO2 38.8, Bicarb 30.4, slightly improved. - Possibly due to recent diuretics - consider dehydration if tachycardia worsens.  Prior to Admission Living Arrangement: SNF Anticipated Discharge Location: SNF vs. Hospice ([place] Discussions to be had with daughter) Barriers to Discharge: Workup of Altered Mental Status; Multifocal PNA treatment  Jeralyn Bennett, MD 11/23/2019, 2:40 PM Pager: [phone] After 5pm on weekdays and 1pm on weekends: On Call pager [phone]

## 2019-11-23 NOTE — Progress Notes (Signed)
LTM re-glued after MRI; no skin breakdown was seen; new leads used. [facility] contacted.

## 2019-11-23 NOTE — Progress Notes (Addendum)
Pt's lungs continue to sound extremely bad as was also reported to me by night shift nurse---I have contacted respiratory to request naso/trach suction, and requested dr Rebeca Alert to order sputum test and retest patient for COVID---prev COVID swabbed on 9/24 with negative result---even xray results from yesterday 9/29 are suggesting possible COVID findings--before collecting COVID swab from both nares, I did find that both nares were already bleeding---I have advised dr Rebeca Alert in text page---also advised  that prev ordered MRI needs consent, and imaging has been unable to get any family member to answer phone to obtain consent---imaging dept requested that I order KUB instead

## 2019-11-24 LAB — BASIC METABOLIC PANEL
Anion gap: 11 (ref 5–15)
BUN: 18 mg/dL (ref 8–23)
CO2: 26 mmol/L (ref 22–32)
Calcium: 8.8 mg/dL — ABNORMAL LOW (ref 8.9–10.3)
Chloride: 108 mmol/L (ref 98–111)
Creatinine, Ser: 0.74 mg/dL (ref 0.44–1.00)
GFR calc Af Amer: >60 mL/min (ref >60)
GFR calc non Af Amer: >60 mL/min (ref >60)
Glucose, Bld: 111 mg/dL — ABNORMAL HIGH (ref 70–99)
Potassium: 3.7 mmol/L (ref 3.5–5.1)
Sodium: 145 mmol/L (ref 135–145)

## 2019-11-24 LAB — GLUCOSE, CAPILLARY
Glucose-Capillary: 150 mg/dL — ABNORMAL HIGH (ref 70–99)
Glucose-Capillary: 185 mg/dL — ABNORMAL HIGH (ref 70–99)

## 2019-11-24 LAB — COMPREHENSIVE METABOLIC PANEL
ALT: 14 U/L (ref 0–44)
AST: 17 U/L (ref 15–41)
Albumin: 2.1 g/dL — ABNORMAL LOW (ref 3.5–5.0)
Alkaline Phosphatase: 53 U/L (ref 38–126)
Anion gap: 11 (ref 5–15)
BUN: 17 mg/dL (ref 8–23)
CO2: 28 mmol/L (ref 22–32)
Calcium: 8.4 mg/dL — ABNORMAL LOW (ref 8.9–10.3)
Chloride: 106 mmol/L (ref 98–111)
Creatinine, Ser: 0.76 mg/dL (ref 0.44–1.00)
GFR calc Af Amer: >60 mL/min (ref >60)
GFR calc non Af Amer: >60 mL/min (ref >60)
Glucose, Bld: 108 mg/dL — ABNORMAL HIGH (ref 70–99)
Potassium: 3.3 mmol/L — ABNORMAL LOW (ref 3.5–5.1)
Sodium: 145 mmol/L (ref 135–145)
Total Bilirubin: 1 mg/dL (ref 0.3–1.2)
Total Protein: 5.9 g/dL — ABNORMAL LOW (ref 6.5–8.1)

## 2019-11-24 LAB — CBC WITH DIFFERENTIAL/PLATELET
Abs Immature Granulocytes: 0.15 10*3/uL — ABNORMAL HIGH (ref 0.00–0.07)
Basophils Absolute: 0 10*3/uL (ref 0.0–0.1)
Basophils Relative: 0 %
Eosinophils Absolute: 0 10*3/uL (ref 0.0–0.5)
Eosinophils Relative: 0 %
HCT: 40.9 % (ref 36.0–46.0)
Hemoglobin: 12.9 g/dL (ref 12.0–15.0)
Immature Granulocytes: 2 %
Lymphocytes Relative: 23 %
Lymphs Abs: 2.2 10*3/uL (ref 0.7–4.0)
MCH: 26.3 pg (ref 26.0–34.0)
MCHC: 31.5 g/dL (ref 30.0–36.0)
MCV: 83.3 fL (ref 80.0–100.0)
Monocytes Absolute: 0.6 10*3/uL (ref 0.1–1.0)
Monocytes Relative: 7 %
Neutro Abs: 6.3 10*3/uL (ref 1.7–7.7)
Neutrophils Relative %: 68 %
Platelets: 301 10*3/uL (ref 150–400)
RBC: 4.91 MIL/uL (ref 3.87–5.11)
RDW: 15 % (ref 11.5–15.5)
WBC: 9.2 10*3/uL (ref 4.0–10.5)
nRBC: 0 % (ref 0.0–0.2)

## 2019-11-24 MED ORDER — AMANTADINE HCL 50 MG/5ML PO SYRP
25.0000 mg | ORAL_SOLUTION | Freq: Every day | ORAL | Status: DC
  Filled 2019-11-24 (×2): qty 2.5

## 2019-11-24 MED ORDER — OSMOLITE 1.2 CAL PO LIQD
1000.0000 mL | ORAL | Status: DC
  Administered 2019-11-24: 1000 mL
  Administered 2019-11-24: 45 mL
  Administered 2019-11-25 – 2019-12-05 (×10): 1000 mL
  Filled 2019-11-24 (×18): qty 1000

## 2019-11-24 MED ORDER — POTASSIUM CHLORIDE 10 MEQ/100ML IV SOLN
10.0000 meq | INTRAVENOUS | Status: AC
  Administered 2019-11-24 (×3): 10 meq via INTRAVENOUS
  Filled 2019-11-24 (×3): qty 100

## 2019-11-24 MED ORDER — POTASSIUM CHLORIDE 20 MEQ PO PACK
40.0000 meq | PACK | Freq: Once | ORAL | Status: DC
  Filled 2019-11-24: qty 2

## 2019-11-24 MED ORDER — HYDRALAZINE HCL 20 MG/ML IJ SOLN
10.0000 mg | Freq: Four times a day (QID) | INTRAMUSCULAR | Status: DC
  Administered 2019-11-24 – 2019-11-25 (×7): 10 mg via INTRAVENOUS
  Filled 2019-11-24 (×7): qty 1

## 2019-11-24 MED ORDER — LACTATED RINGERS IV SOLN: INTRAVENOUS | Status: DC

## 2019-11-24 MED ORDER — PROSOURCE TF PO LIQD
45.0000 mL | Freq: Every day | ORAL | Status: DC
  Administered 2019-11-24 – 2019-12-08 (×15): 45 mL
  Filled 2019-11-24 (×15): qty 45

## 2019-11-24 NOTE — Procedures (Signed)
Cortrak  Person Inserting Tube:  Jd Mccaster E, RD Tube Type:  Cortrak - 43 inches Tube Location:  Left nare Initial Placement:  Stomach Secured by: Bridle Technique Used to Measure Tube Placement:  Documented cm marking at nare/ corner of mouth Cortrak Secured At:  75 cm    Cortrak Tube Team Note:  Consult received to place a Cortrak feeding tube.   No x-ray is required. RN may begin using tube.     If the tube becomes dislodged please keep the tube and contact the Cortrak team at [email or web address] (password [email or web address]) for replacement.  If after hours and replacement cannot be delayed, place a NG tube and confirm placement with an abdominal x-ray.    Sherilynn Dieu, MS, RD, LDN RD pager number and weekend/on-call pager number located in Amion.   

## 2019-11-24 NOTE — Progress Notes (Signed)
  Speech Language Pathology Treatment: Dysphagia  Patient Details Name: Nicole Blackwell MRN: [id number] DOB: May 16, 1935 Today's Date: 11/24/2019 Time: 4982-6415 SLP Time Calculation (min) (ACUTE ONLY): 15 min  Assessment / Plan / Recommendation Clinical Impression  Pt was seen for dysphagia treatment. Her level of alertness and verbal output were both improved compared to yesterday. She continues to demonstrate a wet vocal quality at baseline, suggesting impaired secretion management. Pt accepted puree and ice chip boluses with prolonged bolus manipulation but mastication adequate with ice chips. Pt exhibited an increased wet vocal quality and coughing following trials, suggesting aspiration. Prompted coughing and suctioning improved vocal quality but did not eliminate the wet vocal quality. Pt has exhibited improved swallow function but still does not present as a candidate for safe p.o. intake at this time. Pt has been unable to tolerate a p.o. diet since 9/27 and short-term non oral alimentation (e.g., Cortrak) is still recommended while SLP continues dysphagia treatment. Pt's nurse, Nicole Blackwell, and the attending have been advised of pt's progress and recommendations.     HPI HPI: Nicole Blackwell is a 84 y.o. female with medical history significant for dementia with no behavioral disturbances, essential hypertension, chronic diastolic CHF, seizure disorder on Depakote, oropharyngeal dysphagia, who presented from SNF due to increasing shortness of breath. MBS 9/25: mild oropharyngeal dysphagia c/b trace, transient laryngeal penetration of thin liquid x1 on examination. EEG 9/29: moderate diffuse encephalopathy, nonspecific etiology but could be secondary to toxic-metabolic causes. Per Dr. Rebeca Blackwell' note on 9/30, pt's daughter, "Nicole Blackwell says Nicole Blackwell would definitely want life saving measures, even if the possibility of success were low."      SLP Plan  Continue with current plan of care       Recommendations   Diet recommendations: NPO Medication Administration: Via alternative means                Oral Care Recommendations: Oral care QID;Staff/trained caregiver to provide oral care Follow up Recommendations: Skilled Nursing facility SLP Visit Diagnosis: Dysphagia, unspecified (R13.10) Plan: Continue with current plan of care       Nicole Blackwell I. Nicole Blackwell, [place], [place] Office number [phone] Pager [place] 11/24/2019, 11:46 AM

## 2019-11-24 NOTE — Progress Notes (Addendum)
Subjective: No acute events overnight.  ROS: Unable to obtain due to poor mental status  Examination  Vital signs in last 24 hours: Temp:  [98.1 F (36.7 C)-100.2 F (37.9 C)] 98.7 F (37.1 C) (10/01 0745) Pulse Rate:  [99-112] 99 (10/01 1112) Resp:  [20-28] 28 (10/01 1112) BP: (147-180)/(81-88) 180/87 (10/01 1112) SpO2:  [92 %-98 %] 98 % (10/01 1112) Weight:  [55.7 kg] 55.7 kg (10/01 0356)  General: lying in bed, not in apparent distress CVS: pulse-normal rate and rhythm RS: breathing comfortably, coarse breath sounds bilaterally Extremities: normal, warm Neuro:  Sitting in bed, eyes open, mumbling, not following commands, 3/5 in bilateral upper extremities, 2/5 in bilateral lower extremities, rigidity in all 4 extremities, 1+ reflexes  Basic Metabolic Panel: Recent Labs  Lab 11/18/19 0144 11/18/19 0144 11/19/19 1009 11/19/19 2104 11/20/19 0518 11/20/19 0518 11/21/19 0142 11/21/19 0142 11/22/19 0230 11/23/19 1147 11/24/19 0030  NA 138   < > 139   < > 138  --  139  --  141 144 145  K 3.4*   < > 2.8*   < > 3.4*  --  4.0  --  4.0 3.7 3.3*  CL 98   < > 100   < > 96*  --  100  --  101 103 106  CO2 29   < > 30   < > 32  --  30  --  25 30 28   GLUCOSE 94   < > 101*   < > 99  --  81  --  104* 114* 108*  BUN 16   < > 12   < > 9  --  10  --  19 16 17   CREATININE 0.85   < > 0.79   < > 0.90  --  0.70  --  0.88 0.76 0.76  CALCIUM 8.1*   < > 8.3*   < > 8.0*   < > 8.2*   < > 8.5* 8.4* 8.4*  MG 1.7  --  1.9  --  1.9  --  2.6*  --   --   --   --   PHOS 1.9*  --  2.2*  --  3.4  --  2.7  --  3.6  --   --    < > = values in this interval not displayed.    CBC: Recent Labs  Lab 11/20/19 0518 11/21/19 0142 11/22/19 0230 11/23/19 1147 11/24/19 0030  WBC 7.7 6.7 8.2 9.9 9.2  NEUTROABS 5.0 3.7 5.6 7.2 6.3  HGB 12.7 13.9 13.8 13.2 12.9  HCT 39.8 43.5 43.8 42.1 40.9  MCV 81.6 83.0 83.3 82.2 83.3  PLT 180 180 255 274 301     Coagulation Studies: No results for input(s):  LABPROT, INR in the last 72 hours.  Imaging MRI brain with and without contrast, no acute abnormality.  ASSESSMENT AND PLAN: 84 year old female with advanced dementia admitted for shortness of breath currently on treatment for sepsis with HCAP.Continues to have worsening mental status. Therefore neurology was consulted.   Acute encephalopathy, suspected infectious/toxic metabolic Epilepsy Hypertension Hypokalemia Hypoproteinemia with hypoalbuminemia -Patient continues to be altered, nonverbal and also has increased tone on exam today. -Differentials include delirium in setting of dementia,  Recommendations: -Discontinue LTM EEG as patient has not had any further seizures. -Continue valproic acid 250 mg every 8 hours.  Per review of chart, patient was also supposed to be on Keppra that she has not been taking.  As  EEG did not show any evidence of seizures, will continue to hold Keppra at this point to minimize worsening of mentation. -We will start patient on amantadine 25 mg daily to help with mental status, rigidity.  Of note, mentating can sometimes worsen mentation in elderly demented individuals.  Will monitor for side effects and stop if needed.  However, if patient shows improvement, will increase to 50 mg -Continue seizure precautions, delirium precautions, avoid sedating medications -As needed IV Ativan 1 mg for clinical seizure-like activity -Management of rest of comorbidities per primary team  I have spent a total of29minuteswith the patient reviewing hospitalnotes, test results, labs and examining the patient as well as establishing an assessment and plan that was discussed personally with the patient' steam.>50% of time was spent in direct patient care.  Zeb Comfort Epilepsy [facility] For questions after 5pm please refer to AMION to reach the Neurologist on call

## 2019-11-24 NOTE — Progress Notes (Signed)
Initial Nutrition Assessment  DOCUMENTATION CODES:   Severe malnutrition in context of chronic illness  INTERVENTION:  Initiate tube feeds via Cortrak: - Start Osmolite 1.2 @ 20 ml/hr and titrate by 10 ml q 4 hours to goal rate of 50 ml/hr (1200 ml/day) - ProSource 45 ml daily  Tube feeding regimen at goal provides 1480 kcal, 78 grams of protein, and 984 ml of H2O.  Monitor magnesium, potassium, and phosphorus daily for at least 3 days, MD to replete as needed, as pt is at risk for refeeding syndrome given severe malnutrition, poor PO intake since admission.  NUTRITION DIAGNOSIS:  Severe Malnutrition related to chronic illness (dementia, dysphagia, CHF) as evidenced by moderate fat depletion, severe muscle depletion, percent weight loss (20.8% weight loss in less than 1 year).  GOAL:   Patient will meet greater than or equal to 90% of their needs  MONITOR:   Diet advancement, Labs, Weight trends, TF tolerance, Skin, I & O's  REASON FOR ASSESSMENT:   Consult Enteral/tube feeding initiation and management  ASSESSMENT:   84 year old female who presented to the ED from SNF on 9/24 with worsening SOB. PMH of HTN, dementia, CHF, seizure disorder, oropharyngeal dysphagia. Pt admitted with sepsis secondary to multifocal HCAP with suspicion for aspiration.   9/25 - MBS with recommendation for dysphagia 2 diet with thin liquids 9/29 - NPO  RD consulted for tube feeding initiation and management. Plan is for gastric Cortrak placement today at bedside.  Unable to obtain diet or weight history from pt due to AMS. Reviewed weight history in chart. Pt with a 14.6 kg weight loss since 12/08/18. This is a 20.8% weight loss in less than 1 year which is severe and significant for timeframe. Pt meets criteria for severe malnutrition.  Monitor magnesium, potassium, and phosphorus daily for at least 3 days, MD to replete as needed, as pt is at risk for refeeding syndrome given severe  malnutrition, poor PO intake since admission.  Meal Completion: 0-20% when on dysphagia 2 diet  Medications reviewed and include: senna, vitamin B-12, IV KCl 10 mEq x 3 runs IVF: LR @ 100 ml/hr  Labs reviewed: potassium 3.3  UOP: 300 ml x 24 hours  NUTRITION - FOCUSED PHYSICAL EXAM:    Most Recent Value  Orbital Region Moderate depletion  Upper Arm Region Moderate depletion  Thoracic and Lumbar Region Severe depletion  Buccal Region Moderate depletion  Temple Region Moderate depletion  Clavicle Bone Region Severe depletion  Clavicle and Acromion Bone Region Severe depletion  Scapular Bone Region Moderate depletion  Dorsal Hand Moderate depletion  Patellar Region Severe depletion  Anterior Thigh Region Severe depletion  Posterior Calf Region Severe depletion  Edema (RD Assessment) None  Hair Reviewed  Eyes Reviewed  Mouth Reviewed  Skin Reviewed  Nails Reviewed       Diet Order:   Diet Order            Diet NPO time specified  Diet effective now                 EDUCATION NEEDS:   Not appropriate for education at this time  Skin:  Skin Assessment: Reviewed RN Assessment  Last BM:  no documented BM  Height:   Ht Readings from Last 1 Encounters:  11/17/19 5\' 5"  (1.651 m)    Weight:   Wt Readings from Last 1 Encounters:  11/24/19 55.7 kg    Ideal Body Weight:  56.8 kg  BMI:  Body mass index  is 20.43 kg/m.  Estimated Nutritional Needs:   Kcal:  1400-1600  Protein:  65-80 grams  Fluid:  1.4-1.6 L    Gaynell Face, MS, RD, LDN Inpatient Clinical Dietitian Please see AMiON for contact information.

## 2019-11-24 NOTE — Procedures (Addendum)
Patient Name:Nicole Blackwell CVE:[id number] Epilepsy Attending:Challen Spainhour Barbra Sarks Referring Physician/Provider:Dr Zeb Comfort Duration: 11/23/2019 1530 to 11/24/2019 1001  Patient history:Awake, asleep  AEDs during EEG study:Valproate  Technical aspects: This EEG study was done with scalp electrodes positioned according to the 10-20 International system of electrode placement. Electrical activity was acquired at a sampling rate of 500Hz  and reviewed with a high frequency filter of 70Hz  and a low frequency filter of 1Hz . EEG data were recorded continuously and digitally stored.   Description:No posterior dominant rhythm was seen. EEG showed continuous generalized 3-5Hz theta-delta slowing. Triphasic waves, generalized were also noted.Hyperventilation and photic stimulation were not performed.   ABNORMALITY -Continuous slow, generalized -Triphasic waves, generalized  IMPRESSION: This study issuggestive of moderate diffuse encephalopathy, nonspecific etiology but could be secondary to toxic-metabolic causes. No seizures ordefiniteepileptiform discharges were seen throughout the recording.  EEG appears similar to previous day  [place]

## 2019-11-24 NOTE — Progress Notes (Signed)
vLTM EEG complete. No skin breakdown 

## 2019-11-24 NOTE — Progress Notes (Addendum)
Subjective:   Patient continues to be non-verbal and unable to follow commands. She mumbles/moans repeatedly with some upper extremity twitching/shaking. She has increasing in moans with passive movement of her hands and feet.   Unable to obtain further history / ROS given patient's nonverbal status.   Spoke with patient's daughter in the early afternoon, and she is agreeable to placement of NGT for tube feeds.  Objective:  Vital signs in last 24 hours: Vitals:   11/23/19 2311 11/24/19 0100 11/24/19 0350 11/24/19 0356  BP: (!) 161/86 (!) 147/81 (!) 153/88   Pulse: (!) 110 (!) 103 (!) 110   Resp: (!) 23 20 (!) 23   Temp: 99.7 F (37.6 C)  98.1 F (36.7 C)   TempSrc: Axillary  Oral   SpO2: 95% 92% 98%   Weight:    55.7 kg  Height:       General: Patient feels warm to the touch and appears tired and chronically ill, although in no acute distress.  Eyes: PERRLA bilaterally.  Respiratory: Coarse breath sounds L>R. On room air with slightly increased work of breathing. Cardiovascular: Tachycardic, regular rhythm, heart sounds difficult to appreciate due to patient moaning, No lower extremity pitting edema. Mild JVD. Abdominal: Soft and non-tender to palpation. Bowel sounds intact. No rebound or guarding. Neurological: Patient is somnolent and minimally verbally responsive with mumbling only on examination.   Assessment/Plan:  Principal Problem:   HCAP (healthcare-associated pneumonia) Active Problems:   Chronic diastolic CHF (congestive heart failure) (HCC)   Vascular dementia without behavioral disturbance (HCC)   Dementia (HCC)   Protein calorie malnutrition ([place])   Severe sepsis ([place])    This is a 84 year old woman with past medical history significant for dementia, HTN, diastolic heart failure, seizure disorder (on Depakote) oropharyngeal dysphagia, who presented from skilled nursing facility with worsening dyspnea and imaging suggestive of multifocal pneumonia, R>L  likely secondary to aspiration.   This is hospital day 7.  Acute hypoxic resp failure and Sepsis 2/2 multifocal PNA R>L: Cefepime discontinued yesterday (9/30) after 6 days of treatment due to concern for contribution to altered mental status and normal range procalcitonin. 9/30 repeat respiratory PCR panel negative, repeat sputum culture growing rare gram positive cocci, rare gram positive rods, and rare gram variable rods. Patient has been afebrile, maintaining saturations on room air. Breath sounds improved on physical exam although continues to have difficulty clearing secretions.  -Respiratory therapy and flutter valve -Patient NPO secondary to AMS. Placing NGT for tube feedings as below. -Continue levalbuterol q6 hours and albuterol PRN  -Appears hypovolemic on exam, holding additional lasix -If patient develops temperature >100.4 degrees, will repeat blood cultures and U/A  Moderate Diffuse Encephalopathy with Tremor and Hx of Seizure Disorder: Patient has experienced intermittent episodes of decreased arousal and responsiveness with resting tremor vs myoclonus, concerning for partial seizure vs. worsening respiratory status vs. Cefepime effect; seems to be improving very gradually. Neurology following, have discontinued EEG as patient has not demonstrated seizure activity, only moderate diffuse encephalopathy (possibly toxic-metabolic in nature). Valproate level low yesterday, so given 1000 mg loading dose and daily dose increased as below. Head CT and MRI unremarkable. Patient has been hypertensive with pressures ranging 140s-180s/80s, will treat with IV hydralazine in the event her hypertension is contributing to her AMS. - Neurology following, appreciate their recommendations - Valproate 250mg  q8h - Start amantadine 25 mg daily to help with mental status, rigidity - IV Ativan 1 mg PRN for clinical seizure-like activity - Start hydralazine  10 mg four times daily  Electrolyte  abnormalities: K low at 3.3 today. Repleting with IV potassium chloride. - Afternoon BMP - Continue to replace as needed for Mg > 2, K > 4 - Daily monitoring    Dysphagia: This has been a chronic problem. Modified barium swallow study performed 9/25, recommended dysphagia 2 diet with medications administered whole in pure. Patient with stably congested breathing today. As this is day 3 with no nutrition and multiple days before that with minimal intake, spoke with daughter who is agreeable to NGT placement for tube feeds. - SLP following - Order for Cortrak feeding tube placed - Registered dietition consulted, appreciate recs   Tachycardia: Intermittent episodes of SVT vs VT, non-sustained. Previously had elevated troponins. No chest pain. EKG showed LBBB, unchanged from prior. Some hypokalemia as above,repleted -Goal K > 4, Mg > 2 -Continue telemetry -Consider amiodarone if sustained Vtach, holding BB due to concerns for bronchial obstruction.    HFpEF: Echo 11/17/2019 showed EF of 60-65%, right ventricular pressure and volume overload.  Without regional wall motion abnormalities. Prior echo on 08/28/2018 with EF of 60-65% and moderate concentric LV hypertrophy. Hypovolemic on exam, continuing to hold diuretics.  -Monitor volume status closely -Strict I&O's  Metabolic Alkalosis  Initial VBG showed pH 7.518, pCO2 40.2, pO2 <31, and Bicarb 32.5. Repeat VBG 9/30 showed pH 7.505, pCO2 38.8, Bicarb 30.4, slightly improved. - Possibly due to recent diuretics - consider dehydration if tachycardia worsens.  FEN/GI - LR @ 100 cc/hr - Tube feeds via Cortrak, nutrition following - NPO due to aspiration risk  Prior to Admission Living Arrangement: SNF Anticipated Discharge Location: SNF vs. Hospice ([facility] Discussions to be had with daughter) Barriers to Discharge: Workup of Altered Mental Status; Multifocal PNA treatment  Alexandria Lodge, MD 11/24/2019, 5:15 AM Pager: [phone] After 5pm on  weekdays and 1pm on weekends: On Call pager [phone]

## 2019-11-25 ENCOUNTER — Encounter (HOSPITAL_COMMUNITY): Payer: Self-pay | Admitting: Internal Medicine

## 2019-11-25 ENCOUNTER — Inpatient Hospital Stay (HOSPITAL_COMMUNITY): Payer: Medicare (Managed Care)

## 2019-11-25 LAB — COMPREHENSIVE METABOLIC PANEL
ALT: 14 U/L (ref 0–44)
AST: 24 U/L (ref 15–41)
Albumin: 2.1 g/dL — ABNORMAL LOW (ref 3.5–5.0)
Alkaline Phosphatase: 56 U/L (ref 38–126)
Anion gap: 9 (ref 5–15)
BUN: 26 mg/dL — ABNORMAL HIGH (ref 8–23)
CO2: 27 mmol/L (ref 22–32)
Calcium: 8.5 mg/dL — ABNORMAL LOW (ref 8.9–10.3)
Chloride: 107 mmol/L (ref 98–111)
Creatinine, Ser: 0.73 mg/dL (ref 0.44–1.00)
GFR calc Af Amer: >60 mL/min (ref >60)
GFR calc non Af Amer: >60 mL/min (ref >60)
Glucose, Bld: 208 mg/dL — ABNORMAL HIGH (ref 70–99)
Potassium: 3.7 mmol/L (ref 3.5–5.1)
Sodium: 143 mmol/L (ref 135–145)
Total Bilirubin: 0.6 mg/dL (ref 0.3–1.2)
Total Protein: 6 g/dL — ABNORMAL LOW (ref 6.5–8.1)

## 2019-11-25 LAB — GLUCOSE, CAPILLARY
Glucose-Capillary: 117 mg/dL — ABNORMAL HIGH (ref 70–99)
Glucose-Capillary: 123 mg/dL — ABNORMAL HIGH (ref 70–99)
Glucose-Capillary: 146 mg/dL — ABNORMAL HIGH (ref 70–99)
Glucose-Capillary: 165 mg/dL — ABNORMAL HIGH (ref 70–99)
Glucose-Capillary: 172 mg/dL — ABNORMAL HIGH (ref 70–99)
Glucose-Capillary: 216 mg/dL — ABNORMAL HIGH (ref 70–99)

## 2019-11-25 LAB — CULTURE, RESPIRATORY W GRAM STAIN: Culture: NORMAL

## 2019-11-25 LAB — MAGNESIUM: Magnesium: 2.3 mg/dL (ref 1.7–2.4)

## 2019-11-25 LAB — PHOSPHORUS: Phosphorus: 3 mg/dL (ref 2.5–4.6)

## 2019-11-25 IMAGING — DX DG CHEST 1V PORT
1 series · 1 of 1 positions shown · non-contrast
Comparison: [DATE].

CLINICAL DATA: Acute respiratory failure.

EXAM:
PORTABLE CHEST 1 VIEW

[chest ap]
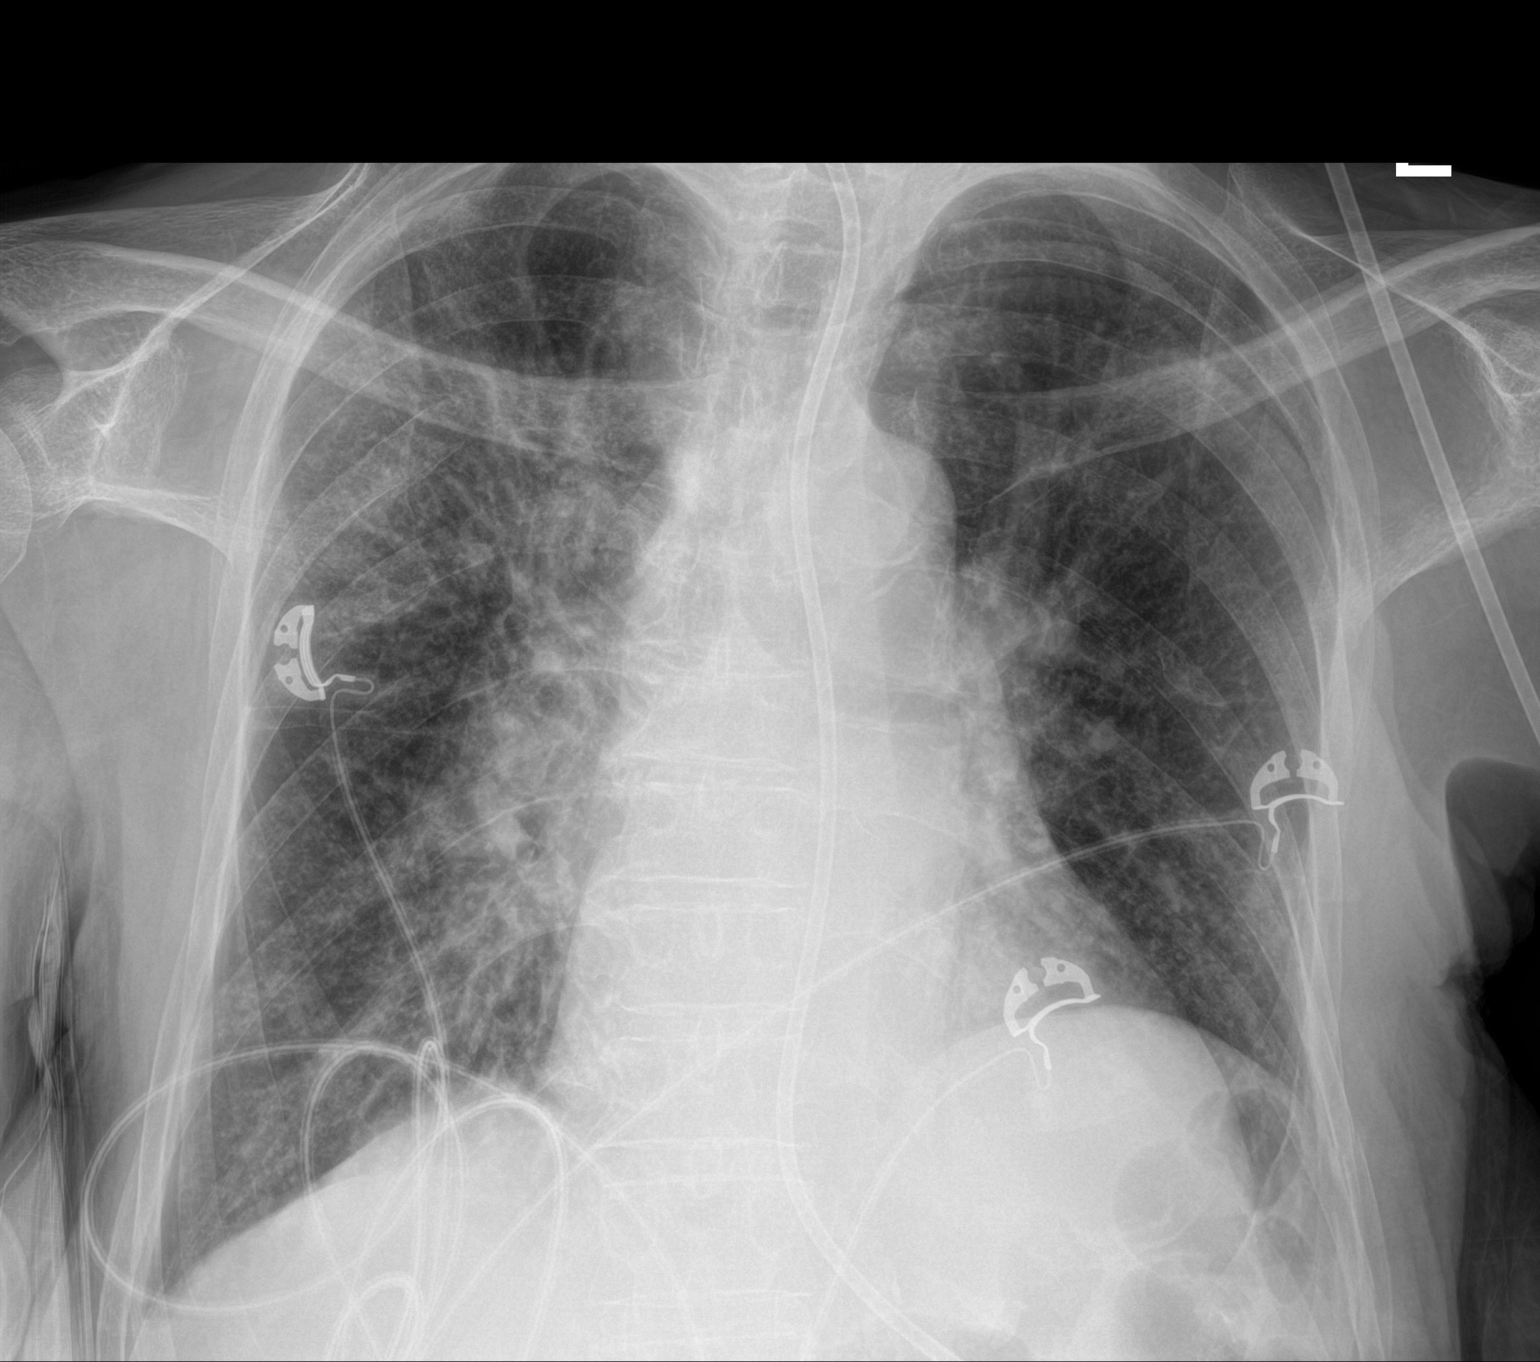

[1 of 1 positions shown; findings below may reference images not displayed]

FINDINGS: Stable cardiomediastinal silhouette. Feeding tube is seen entering
stomach. No pneumothorax or pleural effusion is noted. Left lung is
clear. Stable right upper lobe airspace opacity is noted concerning
for pneumonia. Bony thorax is unremarkable.
IMPRESSION: Stable right upper lobe airspace opacity concerning for pneumonia.

Aortic Atherosclerosis ([PX]-[PX]).

## 2019-11-25 MED ORDER — VITAMIN B-12 100 MCG PO TABS
100.0000 ug | ORAL_TABLET | Freq: Every day | ORAL | Status: DC
  Administered 2019-11-25 – 2019-11-26 (×2): 100 ug via ORAL
  Filled 2019-11-25 (×2): qty 1

## 2019-11-25 MED ORDER — FREE WATER
200.0000 mL | Freq: Three times a day (TID) | Status: DC
  Administered 2019-11-25 – 2019-12-08 (×39): 200 mL

## 2019-11-25 MED ORDER — AMANTADINE HCL 50 MG/5ML PO SYRP
25.0000 mg | ORAL_SOLUTION | Freq: Every day | ORAL | Status: DC
  Administered 2019-11-25 – 2019-11-26 (×2): 25 mg via ORAL
  Filled 2019-11-25 (×2): qty 2.5

## 2019-11-25 MED ORDER — ASPIRIN EC 81 MG PO TBEC
81.0000 mg | DELAYED_RELEASE_TABLET | Freq: Every day | ORAL | Status: DC
  Filled 2019-11-25: qty 1

## 2019-11-25 MED ORDER — SENNOSIDES-DOCUSATE SODIUM 8.6-50 MG PO TABS
1.0000 | ORAL_TABLET | Freq: Two times a day (BID) | ORAL | Status: DC
  Administered 2019-11-25 – 2019-11-26 (×3): 1 via ORAL
  Filled 2019-11-25 (×3): qty 1

## 2019-11-25 MED ORDER — ACETYLCYSTEINE 10 % IN SOLN
2.0000 mL | Freq: Once | RESPIRATORY_TRACT | Status: DC
  Filled 2019-11-25: qty 2

## 2019-11-25 MED ORDER — ACETYLCYSTEINE 20 % IN SOLN
1.0000 mL | Freq: Once | RESPIRATORY_TRACT | Status: AC
  Administered 2019-11-25: 1 mL via RESPIRATORY_TRACT
  Filled 2019-11-25: qty 4

## 2019-11-25 MED ORDER — LOSARTAN POTASSIUM 25 MG PO TABS
12.5000 mg | ORAL_TABLET | Freq: Every day | ORAL | Status: DC
  Administered 2019-11-25 – 2019-11-26 (×2): 12.5 mg via ORAL
  Filled 2019-11-25 (×2): qty 1

## 2019-11-25 MED ORDER — KETOROLAC TROMETHAMINE 30 MG/ML IJ SOLN
15.0000 mg | Freq: Once | INTRAMUSCULAR | Status: AC
  Administered 2019-11-25: 15 mg via INTRAVENOUS
  Filled 2019-11-25: qty 1

## 2019-11-25 NOTE — Significant Event (Signed)
Rapid Response Event Note   Reason for Call :  Decreased LOC  Initial Focused Assessment:  Received a call from the RN stating that she was worried about her patient's LOC.  When I walked into the room the patient was in bed moaning.  She was only responding to painful stimulation.  She had a faint wheeze and decreased breath sounds in her right lower lung.       Interventions:  NT suction and chest x ray ordered  Plan of Care:  Patient will remain on unit but I will swing by to assess the patient   Event Summary:   MD Notified:  Call Time: 0815 Arrival Time: 0820 End Time: 0830  Venetia Maxon, RN

## 2019-11-25 NOTE — Progress Notes (Addendum)
Subjective:   No acute events overnight. Patient has remained afebrile.  Evaluated at bedside during rounds this morning. Patient noted to have stronger cough, moaning similarly to yesterday. Respiratory therapist had just finished suctioning patient When asked if she is in pain, says no but continues moaning. Nursing expressed concern over her pulmonary status, lung exam unchanged from prior, patient saturating >95% on room air.  Later in the morning contacted by patient's nurse who informed us rapid response called due to concern for patient's LOC and respiratory status, concern for aspiration. Rapid response nurse had patient suctioned, paused patient's tube feeds, and ordered CXR. CXR demonstrated stable right upper lobe airspace opacity. On my review, appears improved from previous.   Returned to evaluate patient at bedside. She was noted to be propped sitting upright in the bed, eyes closed, breathing comfortably saturating 95-98% on room air, afebrile, VSS. Not arousable to voice, moans with noxious but no eye opening, pupils equally round and reactive. Pulmonary exam unchanged from prior, rhonchorous throughout.    Objective:  Vital signs in last 24 hours: Vitals:   11/24/19 2315 11/24/19 2320 11/24/19 2332 11/25/19 0338  BP: 125/68   140/67  Pulse: (!) 113 (!) 104 87 (!) 101  Resp: (!) 22 20 (!) 25 (!) 23  Temp: 98 F (36.7 C)   98.6 F (37 C)  TempSrc: Axillary   Axillary  SpO2: 100% 96% 96% 96%  Weight:      Height:       General: Patient sitting upright in bed, eyes open, moaning, chronically-ill appearing  Eyes: PERRLA bilaterally.  Respiratory: Coarse breath sounds throughout, unchanged from yesterday. On room air breathing comfortably with saturations >95% Cardiovascular: Tachycardic, regular rhythm, heart sounds difficult to appreciate due to patient moaning, No lower extremity pitting edema. Abdominal: Soft and non-tender to palpation. Bowel sounds intact. No  rebound or guarding. Extremities: Patient withdraws with passive movement of extremities Neurological: Patient is somnolent and minimally verbally responsive with mumbling only on examination. Not arousable to voice, moans with noxious but no eye opening. Gags with suctioning.  Assessment/Plan:  Principal Problem:   HCAP (healthcare-associated pneumonia) Active Problems:   Chronic diastolic CHF (congestive heart failure) (HCC)   Vascular dementia without behavioral disturbance (HCC)   Dementia (HCC)   Protein-calorie malnutrition, severe (HCC)   Severe sepsis ([place])  Nicole Blackwell is a 84 year old woman with past medical history significant for dementia, HTN, diastolic heart failure, seizure disorder (on Depakote) oropharyngeal dysphagia, who presented from skilled nursing facility with worsening dyspnea and imaging suggestive of multifocal pneumonia, R>L likely secondary to aspiration.   This is hospital day 8.  Acute hypoxic resp failure and Sepsis 2/2 multifocal PNA R>L S/p 6 days of cefepime which was discontinued 9/30 d/t concern for contribution to AMS and normal range procalcitonin. 9/30 repeat respiratory PCR panel negative, repeat sputum culture with few normal respiratory flora. Patient has been afebrile, Tmax 100.2 in last 24 hrs, maintaining saturations on room air. Stronger cough today though continues to have difficulty clearing secretions. Rapid response called by nursing this morning due to concern for patient's mental status and pulmonary exam, tube feeds stopped, repeat CXR unchanged from prior. Do not think patient had aspiration event, will resume tube feeds, but will monitor closely with low threshold to repeat CXR. - Respiratory therapy and flutter valve - Patient NPO secondary to AMS. Cortrak placed yesterday (10/1) for tube feedings - Continue levalbuterol q6 hours and albuterol PRN, Mucomyst - Appears euvolemic on  exam, holding additional lasix - If patient develops  temperature >100.4 degrees, will repeat CXR, blood cultures, and U/A  Moderate Diffuse Encephalopathy with Tremor History of Seizure Disorder  History of Dementia Patient with intermittent episodes of decreased arousal and responsiveness with resting tremor vs myoclonus of unclear etiology. Work-up thus far for metabolic, infectious, and structural causes has been unremarkable. Neuro has been following. Head imaging negative for stroke or structural process. Seizures have been ruled out with EEG. Valproate level low on 9/30, so s/p loading dose and daily dose increased. Amantadine started yesterday (10/1). Respiratory status is stable, patient now on room air. BP better controlled now on IV hydralazine.  - Neurology following, appreciate their recommendations - Valproate 250mg  q8h - Amantadine 25 mg daily to help with mental status, rigidity; discontinuing for now - IV Ativan 1 mg PRN for clinical seizure-like activity - IV Hydralazine 10 mg four times daily  Electrolyte abnormalities K 3.7 today after repletion yesterday.  - Continue to replace as needed for Mg > 2, K > 4 - Daily monitoring    Dysphagia This has been a chronic problem. Modified barium swallow study performed 9/25, recommended dysphagia 2 diet with medications administered whole in pure. Cortrak placed yesterday for tube feedings after patient 3 days NPO - SLP following - Cortrak for tube feedings placed 10/1 - Registered dietition consulted, appreciate recs   Tachycardia: Intermittent episodes of SVT vs VT, non-sustained. Previously had elevated troponins. No chest pain. EKG showed LBBB, unchanged from prior.  - Goal K > 4, Mg > 2 - Continue telemetry - Consider amiodarone if sustained Vtach, holding BB due to concerns for bronchial obstruction.    HFpEF: Echo 11/17/2019 showed EF of 60-65%, right ventricular pressure and volume overload.  Without regional wall motion abnormalities. Prior echo on 08/28/2018 with EF of  60-65% and moderate concentric LV hypertrophy. Hypovolemic on exam, continuing to hold diuretics.  -Monitor volume status closely -Strict I&O's  Metabolic Alkalosis  Initial VBG showed pH 7.518, pCO2 40.2, pO2 <31, and Bicarb 32.5. Repeat VBG 9/30 showed pH 7.505, pCO2 38.8, Bicarb 30.4, slightly improved. - Possibly due to recent diuretics  FEN/GI - Tube feeds via Cortrak, nutrition following  - Osmolite to goal rate of 50 ml/hr (1200 ml/day)  - ProSource 45 ml daily  - Free water flushes - Patient at risk for refeeding, will monitor daily Mg, K, and Phos for at least 3 days - NPO due to aspiration risk  Prior to Admission Living Arrangement: SNF Anticipated Discharge Location: SNF vs. Hospice ([place] Discussions to be had with daughter) Barriers to Discharge: Workup of Altered Mental Status; Multifocal PNA treatment  Alexandria Lodge, MD 11/25/2019, 5:32 AM Pager: [phone] After 5pm on weekdays and 1pm on weekends: On Call pager [phone]

## 2019-11-25 NOTE — Plan of Care (Signed)

## 2019-11-25 NOTE — Plan of Care (Signed)
  Problem: Clinical Measurements: ?Goal: Ability to maintain clinical measurements within normal limits will improve ?Outcome: Progressing ?  ?Problem: Nutrition: ?Goal: Adequate nutrition will be maintained ?Outcome: Progressing ?  ?Problem: Skin Integrity: ?Goal: Risk for impaired skin integrity will decrease ?Outcome: Progressing ?  ?

## 2019-11-25 NOTE — Progress Notes (Signed)
Rapid response nurse Saralyn Pilar in bed side, stopped the tube feeding for a while, order for chest X-ray, pt is having audible wheezing, did nasal suctioning, RT is informed too, talked to MD face to face this morning and aware of the patient's situation. Daughter called this morning and updated her.  Palma Holter, RN

## 2019-11-26 DIAGNOSIS — R251 Tremor, unspecified: Secondary | ICD-10-CM

## 2019-11-26 DIAGNOSIS — J690 Pneumonitis due to inhalation of food and vomit: Secondary | ICD-10-CM

## 2019-11-26 LAB — COMPREHENSIVE METABOLIC PANEL
ALT: 10 U/L (ref 0–44)
AST: 30 U/L (ref 15–41)
Albumin: 2 g/dL — ABNORMAL LOW (ref 3.5–5.0)
Alkaline Phosphatase: 49 U/L (ref 38–126)
Anion gap: 10 (ref 5–15)
BUN: 30 mg/dL — ABNORMAL HIGH (ref 8–23)
CO2: 27 mmol/L (ref 22–32)
Calcium: 8.4 mg/dL — ABNORMAL LOW (ref 8.9–10.3)
Chloride: 106 mmol/L (ref 98–111)
Creatinine, Ser: 0.74 mg/dL (ref 0.44–1.00)
GFR calc Af Amer: >60 mL/min (ref >60)
GFR calc non Af Amer: >60 mL/min (ref >60)
Glucose, Bld: 147 mg/dL — ABNORMAL HIGH (ref 70–99)
Potassium: 3.9 mmol/L (ref 3.5–5.1)
Sodium: 143 mmol/L (ref 135–145)
Total Bilirubin: 1.1 mg/dL (ref 0.3–1.2)
Total Protein: 5.5 g/dL — ABNORMAL LOW (ref 6.5–8.1)

## 2019-11-26 LAB — GLUCOSE, CAPILLARY
Glucose-Capillary: 132 mg/dL — ABNORMAL HIGH (ref 70–99)
Glucose-Capillary: 131 mg/dL — ABNORMAL HIGH (ref 70–99)
Glucose-Capillary: 132 mg/dL — ABNORMAL HIGH (ref 70–99)
Glucose-Capillary: 134 mg/dL — ABNORMAL HIGH (ref 70–99)
Glucose-Capillary: 131 mg/dL — ABNORMAL HIGH (ref 70–99)
Glucose-Capillary: 125 mg/dL — ABNORMAL HIGH (ref 70–99)

## 2019-11-26 LAB — MAGNESIUM: Magnesium: 2.3 mg/dL (ref 1.7–2.4)

## 2019-11-26 LAB — PHOSPHORUS: Phosphorus: 2.2 mg/dL — ABNORMAL LOW (ref 2.5–4.6)

## 2019-11-26 MED ORDER — HYDRALAZINE HCL 20 MG/ML IJ SOLN
5.0000 mg | Freq: Four times a day (QID) | INTRAMUSCULAR | Status: DC
  Administered 2019-11-26 – 2019-11-28 (×8): 5 mg via INTRAVENOUS
  Filled 2019-11-26 (×8): qty 1

## 2019-11-26 MED ORDER — VITAMIN B-12 100 MCG PO TABS
100.0000 ug | ORAL_TABLET | Freq: Every day | ORAL | Status: DC
  Administered 2019-11-27 – 2019-12-08 (×12): 100 ug
  Filled 2019-11-26 (×15): qty 1

## 2019-11-26 MED ORDER — SENNOSIDES-DOCUSATE SODIUM 8.6-50 MG PO TABS
1.0000 | ORAL_TABLET | Freq: Two times a day (BID) | ORAL | Status: DC
  Administered 2019-11-26 – 2019-12-08 (×24): 1
  Filled 2019-11-26 (×24): qty 1

## 2019-11-26 MED ORDER — VALPROIC ACID 250 MG/5ML PO SOLN
250.0000 mg | Freq: Three times a day (TID) | ORAL | Status: DC
  Administered 2019-11-26 – 2019-12-08 (×36): 250 mg
  Filled 2019-11-26 (×43): qty 5

## 2019-11-26 MED ORDER — POTASSIUM PHOSPHATES 15 MMOLE/5ML IV SOLN
20.0000 mmol | Freq: Once | INTRAVENOUS | Status: AC
  Administered 2019-11-26: 20 mmol via INTRAVENOUS
  Filled 2019-11-26: qty 6.67

## 2019-11-26 MED ORDER — ASPIRIN 81 MG PO CHEW
81.0000 mg | CHEWABLE_TABLET | Freq: Every day | ORAL | Status: DC
  Administered 2019-11-27 – 2019-12-07 (×11): 81 mg
  Filled 2019-11-26 (×11): qty 1

## 2019-11-26 MED ORDER — LOSARTAN POTASSIUM 25 MG PO TABS
12.5000 mg | ORAL_TABLET | Freq: Every day | ORAL | Status: DC
  Administered 2019-11-27: 12.5 mg
  Filled 2019-11-26: qty 1

## 2019-11-26 MED ORDER — AMANTADINE HCL 50 MG/5ML PO SYRP
25.0000 mg | ORAL_SOLUTION | Freq: Every day | ORAL | Status: DC
  Administered 2019-11-27 – 2019-12-08 (×12): 25 mg
  Filled 2019-11-26 (×15): qty 2.5

## 2019-11-26 NOTE — Plan of Care (Signed)
  Problem: Clinical Measurements: Goal: Ability to maintain clinical measurements within normal limits will improve Outcome: Progressing   Problem: Clinical Measurements: Goal: Will remain free from infection Outcome: Progressing   Problem: Clinical Measurements: Goal: Diagnostic test results will improve Outcome: Progressing   Problem: Nutrition: Goal: Adequate nutrition will be maintained Outcome: Progressing   Problem: Pain Managment: Goal: General experience of comfort will improve Outcome: Progressing   Problem: Skin Integrity: Goal: Risk for impaired skin integrity will decrease Outcome: Progressing

## 2019-11-26 NOTE — Progress Notes (Addendum)
Subjective:   Patient awake today, big improvement. Reports feeling well, no pain. Says she remembers the last few days. No other complaints.   Objective:  Vital signs in last 24 hours: Vitals:   11/25/19 1935 11/25/19 2133 11/25/19 2300 11/26/19 0400  BP: (!) 126/53 (!) 103/46 (!) 112/52 (!) 117/46  Pulse: 91  98 84  Resp: 17  19 17   Temp: 98.1 F (36.7 C)  97.6 F (36.4 C) (!) 97.5 F (36.4 C)  TempSrc: Oral  Oral Axillary  SpO2: 98%  99% 99%  Weight:    57.5 kg  Height:       Gen: awake spontaneously Neuro: follows commands, alert, oriented, very weak in her extremeties diffusely Resp: not tachypneic, modestly weak cough, good air movement Abd: soft, non tender,  Ext: warm, no edema  Assessment/Plan:  Principal Problem:   HCAP (healthcare-associated pneumonia) Active Problems:   Chronic diastolic CHF (congestive heart failure) (HCC)   Vascular dementia without behavioral disturbance (HCC)   Dementia (HCC)   Protein-calorie malnutrition, severe (HCC)   Severe sepsis ([place])  Ms. Pizzuto is a 84 year old woman with past medical history significant for dementia, HTN, diastolic heart failure, seizure disorder (on Depakote) oropharyngeal dysphagia, who presented from skilled nursing facility with worsening dyspnea and imaging suggestive of multifocal pneumonia, R>L likely secondary to aspiration.   This is hospital day 9.  Acute hypoxic resp failure and Sepsis 2/2 multifocal PNA R>L Finished antibiotics, much improved. Pneumonia seems resolved, cough is still weak, remains an aspiration risk today, but improving.   Moderate Diffuse Encephalopathy with Tremor History of Seizure Disorder  History of Dementia Patient with intermittent episodes of decreased arousal and responsiveness with resting tremor vs myoclonus of unclear etiology. Work-up thus far for metabolic, infectious, and structural causes has been unremarkable. Neuro has been following. Head imaging negative for  stroke or structural process. Seizures have been ruled out with EEG. Valproate level low on 9/30, so s/p loading dose and daily dose increased. Amantadine started 10/1. Respiratory status is stable, patient on room air. BP better controlled now on IV hydralazine. Patient's mental status much improved this morning. She is spontaneously awake, following commands, oriented to person, place, and situation. Will work with pharmacy to transition meds from IV to PO. - Valproate 250mg  q8h - Amantadine 25 mg daily to help with mental status, rigidity - IV Ativan 1 mg PRN for clinical seizure-like activity - Decrease IV Hydralazine to 5 mg four times daily    Dysphagia This has been a chronic problem. Modified barium swallow study performed 9/25, recommended dysphagia 2 diet with medications administered whole in pure. Cortrak placed 10/1 for tube feedings after patient 3 days NPO. Patient with much improved mental status this morning. Will continue to monitor and evaluate for possible removal of NGT and reinitiation of PO intake depending on her mental status stability. - SLP following - Cortrak for tube feedings placed 10/1 - Will think about oral feedings if her mental status remains improved  Chronic HFpEF: Echo 11/17/2019 showed EF of 60-65%, right ventricular pressure and volume overload.  Without regional wall motion abnormalities. Prior echo on 08/28/2018 with EF of 60-65% and moderate concentric LV hypertrophy. Hypovolemic on exam, continuing to hold diuretics.  -Monitor volume status closely -Strict I&O's  FEN/GI - Tube feeds via Cortrak, nutrition following  - Osmolite to goal rate of 50 ml/hr (1200 ml/day)  - ProSource 45 ml daily  - Free water flushes - Patient at risk for refeeding,  will monitor daily Mg, K, and Phos for at least 3 days  - Phos 2.2 this AM, repleted with 2 mmol KPhos - NPO due to aspiration risk  Prior to Admission Living Arrangement: SNF Anticipated Discharge Location:  SNF    Please call for questions or orders: Alexandria Lodge, MD 11/26/2019, 5:55 AM Pager: [phone] After 5pm on weekdays and 1pm on weekends: On Call pager [phone]   Internal Medicine Attending:   I saw and examined the patient. I reviewed the resident's note and I agree with the resident's findings and plan as documented in the resident's note.  Lalla Brothers, MD

## 2019-11-26 NOTE — Progress Notes (Signed)
Pt is alert and responsive, answering the simple questions like her name and following some commands. Vitals stable, NG tube feeding continue. Daughter was in bed side and she recognized her, her mentation is better compared to yesterday. Will continue to monitor the patient  Palma Holter, RN

## 2019-11-27 DIAGNOSIS — Z978 Presence of other specified devices: Secondary | ICD-10-CM

## 2019-11-27 LAB — GLUCOSE, CAPILLARY
Glucose-Capillary: 126 mg/dL — ABNORMAL HIGH (ref 70–99)
Glucose-Capillary: 126 mg/dL — ABNORMAL HIGH (ref 70–99)
Glucose-Capillary: 127 mg/dL — ABNORMAL HIGH (ref 70–99)
Glucose-Capillary: 128 mg/dL — ABNORMAL HIGH (ref 70–99)
Glucose-Capillary: 131 mg/dL — ABNORMAL HIGH (ref 70–99)
Glucose-Capillary: 161 mg/dL — ABNORMAL HIGH (ref 70–99)

## 2019-11-27 LAB — COMPREHENSIVE METABOLIC PANEL
ALT: 13 U/L (ref 0–44)
AST: 20 U/L (ref 15–41)
Albumin: 1.9 g/dL — ABNORMAL LOW (ref 3.5–5.0)
Alkaline Phosphatase: 54 U/L (ref 38–126)
Anion gap: 6 (ref 5–15)
BUN: 17 mg/dL (ref 8–23)
CO2: 30 mmol/L (ref 22–32)
Calcium: 8.3 mg/dL — ABNORMAL LOW (ref 8.9–10.3)
Chloride: 102 mmol/L (ref 98–111)
Creatinine, Ser: 0.58 mg/dL (ref 0.44–1.00)
GFR calc Af Amer: >60 mL/min (ref >60)
GFR calc non Af Amer: >60 mL/min (ref >60)
Glucose, Bld: 143 mg/dL — ABNORMAL HIGH (ref 70–99)
Potassium: 3.9 mmol/L (ref 3.5–5.1)
Sodium: 138 mmol/L (ref 135–145)
Total Bilirubin: 0.3 mg/dL (ref 0.3–1.2)
Total Protein: 5.3 g/dL — ABNORMAL LOW (ref 6.5–8.1)

## 2019-11-27 LAB — MAGNESIUM: Magnesium: 2.1 mg/dL (ref 1.7–2.4)

## 2019-11-27 LAB — PHOSPHORUS: Phosphorus: 2.9 mg/dL (ref 2.5–4.6)

## 2019-11-27 MED ORDER — LOSARTAN POTASSIUM 25 MG PO TABS
25.0000 mg | ORAL_TABLET | Freq: Every day | ORAL | Status: DC
  Administered 2019-11-28 – 2019-12-08 (×11): 25 mg
  Filled 2019-11-27 (×11): qty 1

## 2019-11-27 MED ORDER — GUAIFENESIN ER 600 MG PO TB12
600.0000 mg | ORAL_TABLET | Freq: Two times a day (BID) | ORAL | Status: DC | PRN
  Administered 2019-11-27: 600 mg via ORAL
  Filled 2019-11-27 (×2): qty 1

## 2019-11-27 NOTE — Progress Notes (Addendum)
Subjective:   No acute events overnight.  Evaluated at bedside during morning rounds. Patient in bed, resting comfortably. She states she feels good, denies being in any pain. Gurgling when talking on presentation, requested to be suctioned and moved up in bed. Nothing able to be suctioned up, she then asked to cough up phlegm into paper towel, but was unable to cough out anything. Requested to have NG tube removed today.   Objective:  Vital signs in last 24 hours: Vitals:   11/26/19 1915 11/26/19 2135 11/26/19 2300 11/27/19 0359  BP: 132/63 135/79 140/69 139/73  Pulse: 97  88 80  Resp: 20  (!) 21 20  Temp: 99.1 F (37.3 C)  98.4 F (36.9 C) 98 F (36.7 C)  TempSrc: Oral  Axillary Oral  SpO2: 97%  97% 97%  Weight:    59.3 kg  Height:       Gen: Laying in bed, appears comfortable, awake spontaneously, even more alert this morning Neuro: Alert and oriented to self and place but not year (says 37). Very weak in her extremeties diffusely Resp: Voice is wet/gurgling, cough is stronger today but wet and pt unable to cough up any secretions, not tachypneic, modestly weak cough, good air movement Abd: soft, non tender Ext: warm, no edema  Assessment/Plan:  Principal Problem:   HCAP (healthcare-associated pneumonia) Active Problems:   Chronic diastolic CHF (congestive heart failure) (HCC)   Vascular dementia without behavioral disturbance (HCC)   Dementia (HCC)   Protein-calorie malnutrition, severe (HCC)   Severe sepsis ([place])  Ms. Pennebaker is a 84 year old woman with past medical history significant for dementia, HTN, diastolic heart failure, seizure disorder (on Depakote) oropharyngeal dysphagia, who presented from skilled nursing facility with worsening dyspnea and imaging suggestive of multifocal pneumonia, R>L likely secondary to aspiration.   This is hospital day 10.  Acute hypoxic resp failure and Sepsis 2/2 multifocal PNA R>L Finished antibiotics, much improved.  Pneumonia seems resolved, cough is getting stronger, remains an aspiration risk today, but improving.   Moderate Diffuse Encephalopathy with Tremor History of Seizure Disorder  History of Dementia Patient's mental status much improved over the last two days. She is spontaneously awake, following commands, oriented to person, place, and situation. Tolerating trickle tube feeds. BP better controlled with addition of IV hydralazine which we will work on titrating off but increase losartan first. Continuing amantadine per neurology.  - Valproate 250mg  q8h per tube - Amantadine 25 mg daily to help with mental status, rigidity - IV Ativan 1 mg PRN for clinical seizure-like activity - IV Hydralazine 5 mg four times daily - Increase losartan from 12.5 mg to 25 mg daily   Dysphagia Chronic issue this admission, patient has tolerated regular diet without difficulty during previous admission. SLP following. Had MBS on 9/25 and placed on dysphagia 2 diet with medications administered whole in pure. Cortrak placed 10/1 for tube feedings after patient 3 days NPO secondary to difficulty managing secretions. Patient with much improved mental status the last 2 days. Evaluated by SLP today who note the patient is still a high aspiration risk, recommend continuing NPO for now. Repeat MBS tomorrow.  - SLP following, appreciate recs  - Continue NPO but allow occasional ice chips (2-3) only after oral care  - Repeat modified barium swallow tomorrow - Cortrak for tube feedings placed 10/1  FEN/GI - Tube feeds via Cortrak, nutrition following  - Osmolite to goal rate of 50 ml/hr (1200 ml/day)  - ProSource 45 ml daily  -  Free water flushes - Patient at risk for refeeding, will monitor daily Mg, K, and Phos for at least 3 days  - Phos 2.2 this AM, repleted with 2 mmol KPhos - NPO due to aspiration risk  Chronic HFpEF: Echo 11/17/2019 showed EF of 60-65%, right ventricular pressure and volume overload.  Without  regional wall motion abnormalities. Prior echo on 08/28/2018 with EF of 60-65% and moderate concentric LV hypertrophy. Hypovolemic on exam, continuing to hold diuretics.  -Monitor volume status closely -Strict I&O's  Disposition Patient will not be able to discharge while on tube feeds.  - PT/OT eval and treat - Patient is a PACE patient  Prior to Admission Living Arrangement: SNF Anticipated Discharge Location: SNF    Please call for questions or orders: Alexandria Lodge, MD 11/27/2019, 6:04 AM Pager: [phone] After 5pm on weekdays and 1pm on weekends: On Call pager [phone]

## 2019-11-27 NOTE — Evaluation (Signed)
Physical Therapy Evaluation Patient Details Name: Nicole Blackwell MRN: [id number] DOB: Aug 26, 1935 Today's Date: 11/27/2019   History of Present Illness  Nicole Blackwell is a 84 year old woman with past medical history significant for dementia, HTN, diastolic heart failure, seizure disorder (on Depakote) oropharyngeal dysphagia, who presented from skilled nursing facility with worsening dyspnea and imaging suggestive of multifocal pneumonia, R>L likely secondary to aspiration.   Clinical Impression   Pt admitted with above diagnosis. Comes from her SNF; Poor historian, with dementia at baseline; Presents to PT with generalized weakness, trunk and hip stiffness, wet vocal quality and frequent cough with difficulty managing secretions; Required Max assist to come sit EOB, and mod assist to maintain sitting balance; Able to have conversation, though slow to answer questions; Able to sing a bit sitting up;  Pt currently with functional limitations due to the deficits listed below (see PT Problem List). Pt will benefit from skilled PT to increase their independence and safety with mobility to allow discharge to the venue listed below.       Follow Up Recommendations SNF    Equipment Recommendations  Other (comment) (for continuing discernment post-acutely)    Recommendations for Other Services       Precautions / Restrictions Precautions Precautions: Fall Precaution Comments: Aspiration Precautions      Mobility  Bed Mobility Overal bed mobility: Needs Assistance Bed Mobility: Supine to Sit;Sit to Supine     Supine to sit: Max assist Sit to supine: Max assist   General bed mobility comments: Max assist for all aspects of bed mobility  Transfers Overall transfer level: Needs assistance   Transfers: Lateral/Scoot Transfers          Lateral/Scoot Transfers: Max assist General transfer comment: Max assist for simulated lateral scoot transfer; used bed pad and blocked knees for small scoot  up towards [place]; Difficulty with forward weight shift to unweigh hips for scooting  Ambulation/Gait                Stairs            Wheelchair Mobility    Modified Rankin (Stroke Patients Only)       Balance Overall balance assessment: Needs assistance Sitting-balance support: Feet supported Sitting balance-Leahy Scale: Poor Sitting balance - Comments: Mod assist for sitting up  Postural control: Posterior lean   Standing balance-Leahy Scale: Zero                               Pertinent Vitals/Pain Pain Assessment: Faces Faces Pain Scale: Hurts a little bit Pain Location: Generalized; Grimace with gross movement Pain Descriptors / Indicators: Grimacing Pain Intervention(s): Monitored during session    Home Living Family/patient expects to be discharged to:: Skilled nursing facility                      Prior Function Level of Independence: Needs assistance         Comments: poor historian with dementia at baseline      Hand Dominance   Dominant Hand: Right    Extremity/Trunk Assessment   Upper Extremity Assessment Upper Extremity Assessment: Generalized weakness;RUE deficits/detail;LUE deficits/detail RUE Deficits / Details: Noting gross volitional movement RUE, though weak; requires assist to elevate arm against gravity, noting stiffness at about 90 deg shoulder flexion; active gross grip to command RUE Coordination: decreased fine motor;decreased gross motor LUE Deficits / Details: Noting gross volitional movement LUE, though weak;  requires assist to elevate arm against gravity, noting stiffness at about 90 deg shoulder flexion; active gross grip to command LUE Coordination: decreased fine motor;decreased gross motor    Lower Extremity Assessment Lower Extremity Assessment: Generalized weakness;RLE deficits/detail;LLE deficits/detail RLE Deficits / Details: Noting volitional movement, lifting leg up for donning socks;  grimace and mild stiffness with PROM into hip and knee flexion; adequte ROM for sitting EOB LLE Deficits / Details: Noting volitional movement, lifting leg up for donning socks; grimace and mild stiffness with PROM into hip and knee flexion; adequte ROM for sitting EOB    Cervical / Trunk Assessment Cervical / Trunk Assessment: Other exceptions Cervical / Trunk Exceptions: Tending to stay in upper and lower trunk flexed position sitting EOB  Communication   Communication: Other (comment) (incr time to answer, wet vocal quality; SLP following)  Cognition Arousal/Alertness: Awake/alert Behavior During Therapy: WFL for tasks assessed/performed Overall Cognitive Status: No family/caregiver present to determine baseline cognitive functioning                                 General Comments: Per RN, pt more interactive today, and better able to participate; Incr time to answer questions; Tells me she enjoys church      General Comments General comments (skin integrity, edema, etc.): Session conducted on Room Air and O2 sats at good levels throughout session; Bp 151/81 sitting EOB; HR 85; incontinent of bowels; assisted RN with cleaning up    Exercises     Assessment/Plan    PT Assessment Patient needs continued PT services  PT Problem List Decreased strength;Decreased range of motion;Decreased activity tolerance;Decreased balance;Decreased mobility;Decreased coordination;Decreased cognition;Decreased knowledge of use of DME;Decreased safety awareness;Decreased knowledge of precautions;Cardiopulmonary status limiting activity       PT Treatment Interventions DME instruction;Gait training;Functional mobility training;Therapeutic activities;Therapeutic exercise;Balance training;Neuromuscular re-education;Cognitive remediation;Patient/family education    PT Goals (Current goals can be found in the Care Plan section)  Acute Rehab PT Goals Patient Stated Goal: Did not state,  agreeable to sitting up PT Goal Formulation: Patient unable to participate in goal setting Time For Goal Achievement: 12/11/19 Potential to Achieve Goals: Fair    Frequency Min 2X/week   Barriers to discharge        Co-evaluation               AM-PAC PT "6 Clicks" Mobility  Outcome Measure Help needed turning from your back to your side while in a flat bed without using bedrails?: A Lot Help needed moving from lying on your back to sitting on the side of a flat bed without using bedrails?: Total Help needed moving to and from a bed to a chair (including a wheelchair)?: Total Help needed standing up from a chair using your arms (e.g., wheelchair or bedside chair)?: Total Help needed to walk in hospital room?: Total Help needed climbing 3-5 steps with a railing? : Total 6 Click Score: 7    End of Session Equipment Utilized During Treatment:  (bed pad) Activity Tolerance: Patient tolerated treatment well Patient left: in bed;with call bell/phone within reach;with nursing/sitter in room Nurse Communication: Mobility status PT Visit Diagnosis: Other abnormalities of gait and mobility (R26.89);Muscle weakness (generalized) (M62.81)    Time: 1100-1131 PT Time Calculation (min) (ACUTE ONLY): 31 min   Charges:   PT Evaluation $PT Eval Moderate Complexity: 1 Mod PT Treatments $Therapeutic Activity: 8-22 mins        Siham Bucaro  Gregary Cromer, [place] Pager [phone] Office [place] 11/27/2019, 11:56 AM

## 2019-11-27 NOTE — Progress Notes (Signed)
  Speech Language Pathology Treatment: Dysphagia  Patient Details Name: Nicole Blackwell MRN: [id number] DOB: 10/24/1935 Today's Date: 11/27/2019 Time: 1015-1030 SLP Time Calculation (min) (ACUTE ONLY): 15 min  Assessment / Plan / Recommendation Clinical Impression  Pt resting upon entering room, but easily rousable, participatory, engaging in social conversation.  Baseline voice is wet/congested.  RN reports that RT performed NT suctioning this am due to severity of secretions.  She appears to have continued issues with secretion accumulation and limited spontaneous swallowing.  Trials of ice chips and teaspoons of water led to consistent and explosive coughing, concerning for aspiration.   This is a difficult situation, as pt would benefit from opportunities to swallow, however her risk of some aspiration remains high.  Recommend continuing NPO for now - but allow occasional ice chips (2-3) only after oral care.  SLP will continue to follow for improvements/readiness.      HPI HPI: Nicole Blackwell is a 84 y.o. female with medical history significant for dementia with no behavioral disturbances, essential hypertension, chronic diastolic CHF, seizure disorder on Depakote, oropharyngeal dysphagia, who presented from SNF due to increasing shortness of breath. MBS 9/25: mild oropharyngeal dysphagia c/b trace, transient laryngeal penetration of thin liquid x1 on examination. EEG 9/29: moderate diffuse encephalopathy, nonspecific etiology but could be secondary to toxic-metabolic causes. Per Dr. Rebeca Alert' note on 9/30, pt's daughter, "Nicole Blackwell says Nicole Blackwell would definitely want life saving measures, even if the possibility of success were low."      SLP Plan  Continue with current plan of care       Recommendations  Diet recommendations: NPO Medication Administration: Via alternative means                Oral Care Recommendations: Oral care QID Follow up Recommendations: Skilled Nursing facility SLP  Visit Diagnosis: Dysphagia, unspecified (R13.10) Plan: Continue with current plan of care       GO                Nicole Blackwell 11/27/2019, 10:32 AM  Nicole Blackwell, [place] Office number [phone] Pager [phone]

## 2019-11-27 NOTE — Plan of Care (Signed)

## 2019-11-28 ENCOUNTER — Inpatient Hospital Stay (HOSPITAL_COMMUNITY): Payer: Medicare (Managed Care)

## 2019-11-28 LAB — GLUCOSE, CAPILLARY
Glucose-Capillary: 116 mg/dL — ABNORMAL HIGH (ref 70–99)
Glucose-Capillary: 117 mg/dL — ABNORMAL HIGH (ref 70–99)
Glucose-Capillary: 157 mg/dL — ABNORMAL HIGH (ref 70–99)
Glucose-Capillary: 164 mg/dL — ABNORMAL HIGH (ref 70–99)
Glucose-Capillary: 91 mg/dL (ref 70–99)
Glucose-Capillary: 91 mg/dL (ref 70–99)

## 2019-11-28 LAB — CBC
HCT: 39.1 % (ref 36.0–46.0)
Hemoglobin: 12.3 g/dL (ref 12.0–15.0)
MCH: 25.9 pg — ABNORMAL LOW (ref 26.0–34.0)
MCHC: 31.5 g/dL (ref 30.0–36.0)
MCV: 82.3 fL (ref 80.0–100.0)
Platelets: 382 10*3/uL (ref 150–400)
RBC: 4.75 MIL/uL (ref 3.87–5.11)
RDW: 15.2 % (ref 11.5–15.5)
WBC: 9.9 10*3/uL (ref 4.0–10.5)
nRBC: 0 % (ref 0.0–0.2)

## 2019-11-28 LAB — BASIC METABOLIC PANEL
Anion gap: 8 (ref 5–15)
BUN: 14 mg/dL (ref 8–23)
CO2: 28 mmol/L (ref 22–32)
Calcium: 8.4 mg/dL — ABNORMAL LOW (ref 8.9–10.3)
Chloride: 101 mmol/L (ref 98–111)
Creatinine, Ser: 0.47 mg/dL (ref 0.44–1.00)
GFR calc Af Amer: >60 mL/min (ref >60)
GFR calc non Af Amer: >60 mL/min (ref >60)
Glucose, Bld: 180 mg/dL — ABNORMAL HIGH (ref 70–99)
Potassium: 3.9 mmol/L (ref 3.5–5.1)
Sodium: 137 mmol/L (ref 135–145)

## 2019-11-28 LAB — MAGNESIUM: Magnesium: 2.1 mg/dL (ref 1.7–2.4)

## 2019-11-28 LAB — PHOSPHORUS: Phosphorus: 2.7 mg/dL (ref 2.5–4.6)

## 2019-11-28 NOTE — Plan of Care (Signed)

## 2019-11-28 NOTE — Progress Notes (Signed)
  Speech Language Pathology    Patient Details Name: KALANA YUST MRN: [id number] DOB: 1936/01/30 Today's Date: 11/28/2019 Time:  -     MBS scheduled for today at 10:30    Houston Siren 11/28/2019, 8:20 AM  Orbie Pyo Colvin Caroli.Ed Risk analyst [phone] Office [phone]

## 2019-11-28 NOTE — Plan of Care (Signed)
  Problem: Clinical Measurements: Goal: Ability to maintain clinical measurements within normal limits will improve Outcome: Progressing   Problem: Clinical Measurements: Goal: Diagnostic test results will improve Outcome: Progressing   Problem: Pain Managment: Goal: General experience of comfort will improve Outcome: Progressing   Problem: Skin Integrity: Goal: Risk for impaired skin integrity will decrease Outcome: Progressing

## 2019-11-28 NOTE — Progress Notes (Signed)
Subjective:   No acute events overnight.  Evaluated at bedside during morning rounds. Spontaneously awake this morning but oriented to person only. She denies any pain at this time, cough sounds stronger this morning but voice is still very wet.   Objective:  Vital signs in last 24 hours: Vitals:   11/27/19 2029 11/27/19 2152 11/27/19 2356 11/28/19 0327  BP: 132/61 (!) 120/46 117/69 122/68  Pulse: 91  92 88  Resp: 20  20 (!) 21  Temp: 97.8 F (36.6 C)  97.6 F (36.4 C) 98.1 F (36.7 C)  TempSrc: Oral  Oral Oral  SpO2: 97%  97% 97%  Weight:      Height:       Gen: Laying in bed, appears comfortable, awake spontaneously Neuro: Alert and oriented to self only this morning, able to identify hospital with multiple choices HENT: Cortrak in place Resp: Voice is wet/gurgling, cough is stronger today but still wet and pt unable to cough up any secretions, good air movement Abd: soft, non tender Ext: warm, no edema   Assessment/Plan:  Principal Problem:   HCAP (healthcare-associated pneumonia) Active Problems:   Chronic diastolic CHF (congestive heart failure) (HCC)   Vascular dementia without behavioral disturbance (HCC)   Dementia ([place])   Protein-calorie malnutrition, severe ([place])   Severe sepsis ([place])  Nicole Blackwell is a 84 year old woman with past medical history significant for dementia, HTN, diastolic heart failure, seizure disorder (on Depakote) oropharyngeal dysphagia, who presented from skilled nursing facility with worsening dyspnea and imaging suggestive of multifocal pneumonia, R>L likely secondary to aspiration.   This is hospital day 11.  Acute hypoxic resp failure and Sepsis 2/2 multifocal PNA R>L Finished antibiotics, much improved. Pneumonia seems resolved, cough is getting stronger, remains an aspiration risk as below.  Moderate Diffuse Encephalopathy with Tremor History of Seizure Disorder  History of Dementia Patient's mental status improved and stable over  the last three days. She is spontaneously awake, following commands, oriented to person only this morning. Tolerating trickle tube feeds. BP better controlled, will discontinue IV hydralazine, and continue losartan and titrate as necessary. Continuing amantadine per neurology.  - Valproate 250mg  q8h per tube - Amantadine 25 mg daily to help with mental status, rigidity - IV Ativan 1 mg PRN for clinical seizure-like activity - Continue losartan 25 mg daily   Dysphagia Chronic issue this admission, patient has tolerated regular diet without difficulty during previous admission. SLP following. Had MBS on 9/25 and placed on dysphagia 2 diet with medications administered whole in pure. Cortrak placed 10/1 for tube feedings after patient 3 days NPO secondary to difficulty managing secretions. Patient with much improved mental status the last 3 days. MBS today (10/5) demonstrated even further decreased swallow function compared to 9/25 MBS. SLP recommend continued NPO with fair/questionable prognosis for swallow. - SLP following, appreciate recs  - Continue NPO but allow occasional ice chips (2-3) only after oral care - Cortrak for tube feedings placed 10/1  FEN/GI - Tube feeds via Cortrak, nutrition following  - Osmolite to goal rate of 50 ml/hr (1200 ml/day)  - ProSource 45 ml daily  - Free water flushes - Patient at risk for refeeding, will monitor daily Mg, K, and Phos for at least 3 days - NPO due to aspiration risk  Chronic HFpEF: Echo 11/17/2019 showed EF of 60-65%, right ventricular pressure and volume overload.  Without regional wall motion abnormalities. Prior echo on 08/28/2018 with EF of 60-65% and moderate concentric LV hypertrophy. Hypovolemic on  exam, continuing to hold diuretics.  -Monitor volume status closely -Strict I&O's  Disposition Patient will not be able to discharge while on tube feeds. As above, prognosis for swallow is fair/questionable. Attempted to reach patient's PACE  PCP, left voicemail, to facilitate goals of care conversation with patient's family. - PT: recommend SNF - Patient is a PACE patient  Prior to Admission Living Arrangement: SNF Anticipated Discharge Location: SNF   Please call for questions or orders: [place] Lodge, [place] 11/28/2019, 6:58 AM Pager: [phone] After 5pm on weekdays and 1pm on weekends: On Call pager [phone].

## 2019-11-28 NOTE — Plan of Care (Signed)
  Problem: Clinical Measurements: Goal: Ability to maintain clinical measurements within normal limits will improve Outcome: Progressing Goal: Cardiovascular complication will be avoided Outcome: Progressing   

## 2019-11-28 NOTE — Progress Notes (Signed)
Modified Barium Swallow Progress Note  Patient Details  Name: Nicole Blackwell MRN: [id number] Date of Birth: 1935/03/19  Today's Date: 11/28/2019  Modified Barium Swallow completed.  Full report located under Chart Review in the Imaging Section.  Brief recommendations include the following:  Clinical Impression  Pt's swallow function has declined since prior MBS with decreased ability to manage her secretions indicated by significant wet vocal quality. Her cervical spine appears with a slightly increased lodotic curvature and suspect she may have had chronic dysphagia and now with decreased reserved with this illness. Pt's initiation of swallow, epiglottic deflection, tongue base retraction and pharyngeal contraction all diminished. There was maximum residue in the valleculae and mild in pyriform sinuses with barium starting to spill from pyriforms into vestibule and penetrate to her vocal cords without awareness. She was unable to clear residue with subsequent swallow and throat clear/cough attempts were weak and ineffective. Presence of Cortrak may have had slight affect on epiglottic deflection but not to a significant degree. Recommend continued NPO with oral care with fair/questionable prognosis for swallow. ST will focus on secretion management, increasing effectiveness of cough.          Swallow Evaluation Recommendations       SLP Diet Recommendations: NPO       Medication Administration: Via alternative means               Oral Care Recommendations: Oral care BID        [place] Siren 11/28/2019,1:05 PM   Orbie Pyo [place].Ed Risk analyst [phone] Office [phone]

## 2019-11-29 ENCOUNTER — Encounter (HOSPITAL_COMMUNITY): Payer: Self-pay | Admitting: Internal Medicine

## 2019-11-29 DIAGNOSIS — Z515 Encounter for palliative care: Secondary | ICD-10-CM

## 2019-11-29 DIAGNOSIS — Z7189 Other specified counseling: Secondary | ICD-10-CM

## 2019-11-29 DIAGNOSIS — F0390 Unspecified dementia without behavioral disturbance: Secondary | ICD-10-CM | POA: Diagnosis not present

## 2019-11-29 DIAGNOSIS — J189 Pneumonia, unspecified organism: Secondary | ICD-10-CM | POA: Diagnosis not present

## 2019-11-29 LAB — BASIC METABOLIC PANEL
Anion gap: 9 (ref 5–15)
BUN: 12 mg/dL (ref 8–23)
CO2: 29 mmol/L (ref 22–32)
Calcium: 8.4 mg/dL — ABNORMAL LOW (ref 8.9–10.3)
Chloride: 99 mmol/L (ref 98–111)
Creatinine, Ser: 0.48 mg/dL (ref 0.44–1.00)
GFR calc non Af Amer: >60 mL/min (ref >60)
Glucose, Bld: 111 mg/dL — ABNORMAL HIGH (ref 70–99)
Potassium: 4.7 mmol/L (ref 3.5–5.1)
Sodium: 137 mmol/L (ref 135–145)

## 2019-11-29 LAB — GLUCOSE, CAPILLARY
Glucose-Capillary: 106 mg/dL — ABNORMAL HIGH (ref 70–99)
Glucose-Capillary: 112 mg/dL — ABNORMAL HIGH (ref 70–99)
Glucose-Capillary: 114 mg/dL — ABNORMAL HIGH (ref 70–99)
Glucose-Capillary: 116 mg/dL — ABNORMAL HIGH (ref 70–99)
Glucose-Capillary: 130 mg/dL — ABNORMAL HIGH (ref 70–99)
Glucose-Capillary: 154 mg/dL — ABNORMAL HIGH (ref 70–99)

## 2019-11-29 LAB — PHOSPHORUS: Phosphorus: 2.9 mg/dL (ref 2.5–4.6)

## 2019-11-29 LAB — MAGNESIUM: Magnesium: 1.8 mg/dL (ref 1.7–2.4)

## 2019-11-29 MED ORDER — ACETAMINOPHEN 160 MG/5ML PO SOLN
500.0000 mg | Freq: Four times a day (QID) | ORAL | Status: DC | PRN
  Administered 2019-11-29 – 2019-12-08 (×2): 500 mg
  Filled 2019-11-29 (×2): qty 20.3

## 2019-11-29 NOTE — Consult Note (Signed)
Consultation Note Date: 11/29/2019   Patient Name: Nicole Blackwell  DOB: [birth year]  MRN: [id number]  Age / Sex: 84 y.o., female  PCP: Patient, No Pcp Per Referring Physician: Lucious Groves, DO  Reason for Consultation: Establishing goals of care and Psychosocial/spiritual support  HPI/Patient Profile: 84 y.o. female  with past medical history of dementia without behavioral disturbance, HTN, chronic D heart failure, seizure disorder on Depakote, oropharyngeal dysphagia, osteoporosis, history of shingles, history of uterine cancer,, laparoscopic cholecystectomy, live at her home, daughter Nicole Blackwell lives with, admitted on 11/17/2019 with HCAP, acute hypoxic respiratory failure and sepsis secondary to multifocal pneumonia, dysphagia.   Clinical Assessment and Goals of Care: I have reviewed medical records including EPIC notes, labs and imaging, received report from bedside nursing staff, examined the patient.  Nicole Blackwell is lying quietly in bed.  She appears chronically ill and frail, very thin.  She will briefly make but not keep eye contact.  She has known dementia, but is able to tell me her name.  She is unable to tell me where we are.  I am not sure that she can make her basic needs known.  There is no family at bedside at this time.  Nicole Blackwell has a wet quality to her voice.  She is able to cough, which is also wet, but this does not clear the wet quality of her voice.  Call to daughter, Nicole Blackwell,  to discuss diagnosis prognosis, GOC, EOL wishes, disposition and options.  I introduced Palliative Medicine as specialized medical care for people living with serious illness. It focuses on providing relief from the symptoms and stress of a serious illness.   We discussed a brief life review of the patient.  Nicole Blackwell separated/divorced her husband about 30 years ago.  She has 1 child, daughter Nicole Blackwell.  Nicole Blackwell lives  in Nicole Blackwell home helping to care for her.  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  She is active with [facility] and has CNA most days, they are off every other weekend.  She has a CNA from 7-day to 9-day, then goes to [facility] at 9 a, and has a CNA return 5 30-6 30.  As far as functional and nutritional status, Nicole Blackwell has been unable to walk since she became sick (dehydrated) December 2020.  Nicole Blackwell shares that she went to [facility] rehab for a few months but returned to her home March 8.  Nicole Blackwell shares that she believes her mother is not walking "because of them" meaning Nicole Blackwell.  Nicole Blackwell shares that she is not expecting a miracle, but she feels like her mother could improve with more physical therapy.  She does share that Nicole Blackwell has declined a lot in the last few years.  Nicole Blackwell initially denies issues with swallowing, but then admits that Nicole Blackwell was on a pured diet when she was sick in March.  She shares that her swallowing improved.  We discussed her current illness  and what it means in the larger context of her on-going co-morbidities.  Natural disease trajectory and expectations at EOL were discussed.  I asked Nicole Blackwell if anyone has talked with her about the chronic illness pathway related to dementia.  We talked about what is normal and expected including mental status changes, physical changes, nutritional changes.  I ask if [name] ever talked about PEG tube placement (I shared that this is not being offered at this time).  Nicole Blackwell states that she and her mother never discussed this, and the niece's hope is that her mother's ability to take in food and drink improves with improvement in her pneumonia.  I attempted to elicit values and goals of care important to the patient.  I asked Nicole Blackwell to consider the "what if's and maybe's".  Nicole Blackwell states that she would like her mother to go to short-term rehab, with the ultimate goal of returning home.  I encouraged  her to consider how she would care for her mother if she is unable to return home.  Advanced directives, concepts specific to code status, artifical feeding and hydration, were considered and discussed.  Nicole Blackwell states that she and her mother have never talked about CODE STATUS.  She states that she would want life support and for her mother to "be resuscitated".  I shared that this would be "attempted resuscitation".  I ask Nicole Blackwell to consider how long for life support, 2 days, 2 weeks.  Nicole Blackwell states that she is not sure for how long, and I reassure her that we are not asking her for these choices at this time, just for her to think about the future.  I share my concerns that if attempted resuscitation is successful, [name] would still have all of her other health problems and issues.  Nicole Blackwell shares that she would be agreeable to short-term rehab, she would prefer [facility] over Nicole Blackwell. PMT has reached out to Nicole Blackwell for goals of care conference discussion.  Questions and concerns were addressed.  The family was encouraged to call with questions or concerns.   Conference with attending, bedside nursing staff, transition of care team related to patient condition, needs, goals of care.   PMT to continue to follow  Nicole Blackwell -daughter, Nicole Blackwell.  Nicole Blackwell divorced her husband around 30 years ago, and has no other children.    SUMMARY OF RECOMMENDATIONS   At this point full scope/full code Agreeable to short-term rehab with the goal of returning home Continue CODE STATUS discussions Active with [facility]  Code Status/Advance Care Planning:  Full code - Nicole Blackwell states that she and her mother have never talked about CODE STATUS.  She states that she would want life support and for her mother to "be resuscitated".  I shared that this would be "attempted resuscitation".  I ask Nicole Blackwell to consider how long for life support, 2 days, 2 weeks.  Nicole Blackwell states that she is not sure  for how long, and I reassure her that we are not asking her for these choices at this time, just for her to think about the future.  I share my concerns that if attempted resuscitation is successful, Mrs. Wilton would still have all of her other health problems and issues.  Symptom Management:   Per hospitalist, no additional needs at this time.  Palliative Prophylaxis:   Frequent Pain Assessment, Oral Care and Turn Reposition  Additional Recommendations (Limitations, Scope, Preferences):  Full Scope Treatment  Psycho-social/Spiritual:   Desire for further Chaplaincy support:no  Additional Recommendations: Caregiving  Support/Resources and Education on Hospice  Prognosis:   Unable to determine, based on outcomes.  3-6 months or less would not be surprising based on poor functional status, frailty, albumin of 1.9.  1 month would not be surprising if family elects no PEG tube.  Discharge Planning: Resident of [facility] in long-term care.  Anticipate returning there      Primary Diagnoses: Present on Admission: . HCAP (healthcare-associated pneumonia) . Chronic diastolic CHF (congestive heart failure) ([place]) . Vascular dementia without behavioral disturbance ([place]) . Dementia ([place]) . Protein-calorie malnutrition, severe ([place]) . Severe sepsis ([place])   I have reviewed the medical record, interviewed the patient and family, and examined the patient. The following aspects are pertinent.  Past Medical History:  Diagnosis Date  . BACK PAIN   . Dementia ([place])   . Hypertension   . OSTEOPOROSIS   . PLANTAR FASCIITIS   . Positive PPD   . Seizures ([place])   . SHINGLES   . SYMPTOMATIC MENOPAUSAL/FEMALE CLIMACTERIC STATES   . Syncope and collapse 12/13/2014  . Uterine cancer [facility])    Social History   Socioeconomic History  . Marital status: Divorced    Spouse name: Not on file  . Number of children: Not on file  . Years of education: Not on file  . Highest education level: Not  on file  Occupational History  . Not on file  Tobacco Use  . Smoking status: Never Smoker  . Smokeless tobacco: Never Used  Vaping Use  . Vaping Use: Never used  Substance and Sexual Activity  . Alcohol use: No  . Drug use: No  . Sexual activity: Never  Other Topics Concern  . Not on file  Social History Narrative  . Not on file   Social Determinants of Health   Financial Resource Strain:   . Difficulty of Paying Living Expenses: Not on file  Food Insecurity:   . Worried About Charity fundraiser in the Last Year: Not on file  . Ran Out of Food in the Last Year: Not on file  Transportation Needs:   . Lack of Transportation (Medical): Not on file  . Lack of Transportation (Non-Medical): Not on file  Physical Activity:   . Days of Exercise per Week: Not on file  . Minutes of Exercise per Session: Not on file  Stress:   . Feeling of Stress : Not on file  Social Connections:   . Frequency of Communication with Friends and Family: Not on file  . Frequency of Social Gatherings with Friends and Family: Not on file  . Attends Religious Services: Not on file  . Active Member of Clubs or Organizations: Not on file  . Attends Archivist Meetings: Not on file  . Marital Status: Not on file   Family History  Problem Relation Age of Onset  . Healthy Mother   . Healthy Father    Scheduled Meds: . acetylcysteine  4 mL Nebulization Once  . amantadine  25 mg Per Tube Daily  . aspirin  81 mg Per Tube QHS  . chlorhexidine  15 mL Mouth Rinse BID  . Chlorhexidine Gluconate Cloth  6 each Topical Daily  . enoxaparin (LOVENOX) injection  40 mg Subcutaneous Daily  . feeding supplement (PROSource TF)  45 mL Per Tube Daily  . free water  200 mL Per Tube Q8H  . losartan  25 mg Per Tube  Daily  . mouth rinse  15 mL Mouth Rinse q12n4p  . senna-docusate  1 tablet Per Tube BID  . valproic acid  250 mg Per Tube Q8H  . vitamin B-12  100 mcg Per Tube Daily   Continuous Infusions: .  feeding supplement (OSMOLITE 1.2 CAL) 1,000 mL (11/28/19 0651)   PRN Meds:.acetaminophen (TYLENOL) oral liquid 160 mg/5 mL, albuterol, guaiFENesin, LORazepam Medications Prior to Admission:  Prior to Admission medications   Medication Sig Start Date End Date Taking? Authorizing Provider  acetaminophen (TYLENOL) 325 MG tablet Take 650 mg by mouth every 6 (six) hours as needed for mild pain or headache.   Yes [provider]  aspirin EC 81 MG tablet Take 81 mg by mouth daily.    Yes [provider]  bisacodyl (DULCOLAX) 10 MG suppository Place 10 mg rectally once as needed for moderate constipation.   Yes [provider]  cholecalciferol (VITAMIN D3) 25 MCG (1000 UNIT) tablet Take 1,000 Units by mouth daily.   Yes [provider]  divalproex (DEPAKOTE) 250 MG DR tablet Take 2 tablets (500 mg total) by mouth 2 (two) times daily. Patient taking differently: Take 250 mg by mouth 2 (two) times daily.  09/13/19 09/12/20 Yes Maudie Mercury, MD  feeding supplement, ENSURE, (ENSURE) PUDG Take 1 Container by mouth 2 (two) times daily between meals.   Yes [provider]  guaiFENesin (ROBITUSSIN) 100 MG/5ML liquid Take 200 mg by mouth every 6 (six) hours as needed for cough.   Yes [provider]  lactulose (CHRONULAC) 10 GM/15ML solution Take 10 g by mouth daily.   Yes [provider]  losartan (COZAAR) 25 MG tablet TAKE 1 TABLET(25 MG) BY MOUTH DAILY Patient taking differently: Take 12.5 mg by mouth daily.  09/18/18  Yes Billie Ruddy, MD  magnesium hydroxide (MILK OF MAGNESIA) 400 MG/5ML suspension Take 30 mLs by mouth once as needed for mild constipation.   Yes [provider]  Specialty Vitamins Products (PROSTATE PO) Take 30 mLs by mouth 3 (three) times daily.   Yes [provider]  vitamin B-12 (CYANOCOBALAMIN) 100 MCG tablet Take 100 mcg by mouth daily.   Yes [provider]  levETIRAcetam (KEPPRA) 250 MG  tablet Take 1 tablet (250 mg total) by mouth 2 (two) times daily. Patient not taking: Reported on 04/18/2019 03/01/19   Al Decant, MD  Vitamin D, Ergocalciferol, (DRISDOL) 1.25 MG (50000 UNIT) CAPS capsule Take 50,000 Units by mouth every 7 (seven) days. Patient not taking: Reported on 11/17/2019    [provider]   Allergies  Allergen Reactions  . Penicillin G Benzathine Swelling    Did it involve swelling of the face/tongue/throat, SOB, or low BP? Unknown Did it involve sudden or severe rash/hives, skin peeling, or any reaction on the inside of your mouth or nose? Unknown Did you need to seek medical attention at a hospital or doctor's office? Unknown When did it last happen?unknown If all above answers are "NO", may proceed with cephalosporin use.  Marland Kitchen Penicillins Swelling    Did it involve swelling of the face/tongue/throat, SOB, or low BP? Unknown Did it involve sudden or severe rash/hives, skin peeling, or any reaction on the inside of your mouth or nose? Unknown Did you need to seek medical attention at a hospital or doctor's office? Unknown When did it last happen?unknown If all above answers are "NO", may proceed with cephalosporin use.   Review of Systems  Unable to perform ROS: Dementia  Physical Exam Vitals and nursing note reviewed.  Constitutional:      General: She is not in acute distress.    Appearance: She is ill-appearing.     Comments: Appears chronically ill and frail.  Will briefly make but not keep eye contact  Cardiovascular:     Rate and Rhythm: Normal rate.  Pulmonary:     Effort: Pulmonary effort is normal. No respiratory distress.  Abdominal:     General: Abdomen is flat. There is no distension.  Musculoskeletal:        General: No swelling.     Comments: Muscle wasting, BMI 21  Skin:    General: Skin is warm and dry.  Neurological:     Mental Status: She is alert.     Comments: Known dementia, oriented to self only    Psychiatric:     Comments: Calm and cooperative, appears very weak     Vital Signs: BP (!) 148/72 (BP Location: Right Arm)   Pulse 82   Temp 97.7 F (36.5 C) (Axillary)   Resp 16   Ht 5\' 5"  (1.651 m)   Wt 58.4 kg   SpO2 99%   BMI 21.42 kg/m  Pain Scale: PAINAD POSS *See Group Information*: S-Acceptable,Sleep, easy to arouse Pain Score: 0-No pain   SpO2: SpO2: 99 % O2 Device:SpO2: 99 % O2 Flow Rate: .O2 Flow Rate (L/min): 2 L/min  IO: Intake/output summary:   Intake/Output Summary (Last 24 hours) at 11/29/2019 1041 Last data filed at 11/29/2019 0300 Gross per 24 hour  Intake --  Output 1500 ml  Net -1500 ml    LBM: Last BM Date: 11/28/19 Baseline Weight: Weight: 70 kg Most recent weight: Weight: 58.4 kg     Palliative Assessment/Data:   Flowsheet Rows     Most Recent Value  Intake Tab  Referral Department Hospitalist  Unit at Time of Referral Other (Comment)  Palliative Care Primary Diagnosis Pulmonary  Date Notified 11/28/19  Palliative Care Type New Palliative care  Reason for referral Clarify Goals of Care  Date of Admission 11/17/19  Date first seen by Palliative Care 11/29/19  # of days Palliative referral response time 1 Day(s)  # of days IP prior to Palliative referral 11  Clinical Assessment  Palliative Performance Scale Score 20%  Pain Max last 24 hours Not able to report  Pain Min Last 24 hours Not able to report  Dyspnea Max Last 24 Hours Not able to report  Dyspnea Min Last 24 hours Not able to report  Psychosocial & Spiritual Assessment  Palliative Care Outcomes      Time In: 0910 Time Out: 1020 Time Total: 70 minutes  Greater than 50%  of this time was spent counseling and coordinating care related to the above assessment and plan.  Signed by: Drue Novel, Blackwell   Please contact Palliative Medicine Team phone at [phone] for questions and concerns.  For individual provider: See Shea Evans

## 2019-11-29 NOTE — Plan of Care (Signed)

## 2019-11-29 NOTE — Progress Notes (Signed)
Subjective:   Yesterday afternoon spoke with patient's PACE PCP, Linda Hedges NP, and social worker, [name]. Discussed involving palliative care in conversations with family. Spoke with the patient's daughter, [name], to provide update. Daughter supportive of palliative care consult.  No acute events overnight.  Evaluated at bedside during morning rounds. Patient alert and responding appropriately. Voice quality is still very wet, patient is still unable to cough to clear her secretions. She denies any pain. Oriented to self only. Informed patient talked with daughter yesterday and informed her of the plan to continue to monitor her and involve palliative care.   Objective:  Vital signs in last 24 hours: Vitals:   11/28/19 1539 11/28/19 1936 11/28/19 2300 11/29/19 0300  BP: 133/72 118/83 137/75 126/61  Pulse: 99 86 87 78  Resp: 15 20 20 15   Temp: 97.6 F (36.4 C) 98 F (36.7 C) 98.8 F (37.1 C) 98.6 F (37 C)  TempSrc: Axillary Oral Oral Oral  SpO2: 99% 99% 99% 100%  Weight:      Height:      Physical Exam: Gen: Laying in bed, appears comfortable, awake spontaneously Neuro: Alert and oriented to self and place HENT: Cortrak in place Resp: Voice is wet/gurgling, cough isl wet and pt unable to cough up any secretions, good air movement, transmitted upper airway sounds Abd: soft, non tender Ext: warm, no edema    Assessment/Plan:  Principal Problem:   HCAP (healthcare-associated pneumonia) Active Problems:   Chronic diastolic CHF (congestive heart failure) (HCC)   Vascular dementia without behavioral disturbance (HCC)   Dementia (HCC)   Protein-calorie malnutrition, severe (HCC)   Severe sepsis ([place])  Ms. Cocker is a 84 year old woman with past medical history significant for dementia, HTN, diastolic heart failure, seizure disorder (on Depakote) oropharyngeal dysphagia, who presented from skilled nursing facility with worsening dyspnea and imaging suggestive of  multifocal pneumonia, R>L likely secondary to aspiration.   This is hospital day 12.  Acute hypoxic resp failure and Sepsis 2/2 multifocal PNA R>L Finished antibiotics, much improved. Pneumonia seems resolved, cough is getting stronger, remains an aspiration risk as below.  Moderate Diffuse Encephalopathy with Tremor History of Seizure Disorder  History of Dementia Patient's mental status improved and stable over the last four days. She is spontaneously awake, following commands. Tolerating trickle tube feeds. BP controlled and stable on losartan 25 mg alone. Continuing amantadine per neurology.  - Valproate 250mg  q8h per tube - Amantadine 25 mg daily to help with mental status, rigidity, per tube - Continue losartan 25 mg daily   Dysphagia Chronic issue this admission, patient has also previously been on a pureed diet during previous hospitalization. SLP following. Had MBS on 9/25 and placed on dysphagia 2 diet with medications administered whole in pure. Cortrak placed 10/1 for tube feedings after patient 3 days NPO secondary to difficulty managing secretions. Patient with much improved mental status the last 4 days. MBS yesterday (10/5) demonstrated even further decreased swallow function compared to 9/25 MBS. SLP recommend continued NPO with fair/questionable prognosis for swallow. - SLP following, appreciate recs  - Continue NPO but allow occasional ice chips (2-3) only after oral care - Cortrak for tube feedings placed 10/1  FEN/GI - Tube feeds via Cortrak, nutrition following  - Osmolite to goal rate of 50 ml/hr (1200 ml/day)  - ProSource 45 ml daily  - Free water flushes - Patient at risk for refeeding, will monitor daily Mg, K, and Phos - NPO due to aspiration risk  Goals of Care Disposition Patient will not be able to discharge while on tube feeds. As above, prognosis for swallow is fair/questionable. Discussed patient's progress with her PACE PCP Linda Hedges) and social  worker [facility]) yesterday, who are supportive of involving palliative care to faciliate goals of care conversations with patient's family. Palliative consulted and following. - Palliative Care following, appreciate their involvement and guidance  - As of today, patient's family have specified full scope/full code  - Agreeable to short-term rehab with goal of returning home  - Continue Code Status discussions - PT: recommend SNF - Patient is a PACE patient  Chronic HFpEF: Echo 11/17/2019 showed EF of 60-65%, right ventricular pressure and volume overload.  Without regional wall motion abnormalities. Prior echo on 08/28/2018 with EF of 60-65% and moderate concentric LV hypertrophy. Hypovolemic on exam, continuing to hold diuretics.  - Monitor volume status closely - Strict I&O's  Please call for questions or orders: Alexandria Lodge, MD 11/29/2019, 5:56 AM Pager: [phone] After 5pm on weekdays and 1pm on weekends: On Call pager [phone].

## 2019-11-30 ENCOUNTER — Inpatient Hospital Stay (HOSPITAL_COMMUNITY): Payer: Medicare (Managed Care)

## 2019-11-30 DIAGNOSIS — Z66 Do not resuscitate: Secondary | ICD-10-CM

## 2019-11-30 LAB — GLUCOSE, CAPILLARY
Glucose-Capillary: 117 mg/dL — ABNORMAL HIGH (ref 70–99)
Glucose-Capillary: 119 mg/dL — ABNORMAL HIGH (ref 70–99)
Glucose-Capillary: 125 mg/dL — ABNORMAL HIGH (ref 70–99)
Glucose-Capillary: 130 mg/dL — ABNORMAL HIGH (ref 70–99)
Glucose-Capillary: 144 mg/dL — ABNORMAL HIGH (ref 70–99)
Glucose-Capillary: 147 mg/dL — ABNORMAL HIGH (ref 70–99)
Glucose-Capillary: 77 mg/dL (ref 70–99)
Glucose-Capillary: 99 mg/dL (ref 70–99)

## 2019-11-30 LAB — BASIC METABOLIC PANEL
Anion gap: 8 (ref 5–15)
BUN: 11 mg/dL (ref 8–23)
CO2: 28 mmol/L (ref 22–32)
Calcium: 8.7 mg/dL — ABNORMAL LOW (ref 8.9–10.3)
Chloride: 104 mmol/L (ref 98–111)
Creatinine, Ser: 0.56 mg/dL (ref 0.44–1.00)
GFR calc non Af Amer: >60 mL/min (ref >60)
Glucose, Bld: 155 mg/dL — ABNORMAL HIGH (ref 70–99)
Potassium: 4.3 mmol/L (ref 3.5–5.1)
Sodium: 140 mmol/L (ref 135–145)

## 2019-11-30 LAB — PHOSPHORUS: Phosphorus: 3.5 mg/dL (ref 2.5–4.6)

## 2019-11-30 LAB — MAGNESIUM: Magnesium: 2 mg/dL (ref 1.7–2.4)

## 2019-11-30 IMAGING — CT CT ABDOMEN W/O CM
2 of 4 series · 16 of 46 positions shown, 18 images · non-contrast
Comparison: CT abdomen pelvis-[DATE]

CLINICAL DATA: Evaluate gastric anatomy for potential percutaneous
gastrostomy tube placement.

EXAM:
CT ABDOMEN WITHOUT CONTRAST
TECHNIQUE: Multidetector CT imaging of the abdomen was performed following the
standard protocol without IV contrast.

[Series 3: ap without · axial · non-contrast · 0.68mm/px · z∈[+869,+1089]mm · 13 of 52 slices shown, 15 images]
[im 4/52  soft-tissue]
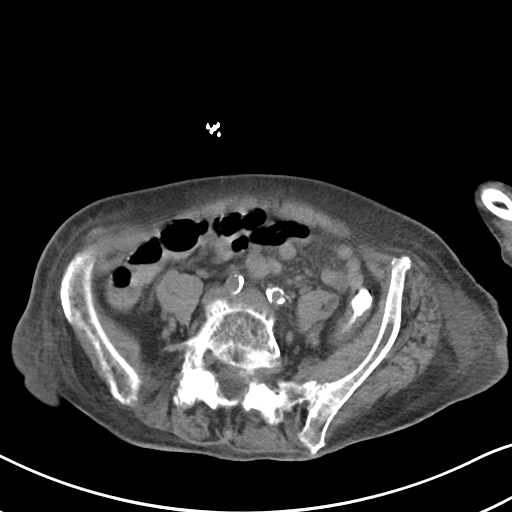
[im 4/52  bone]
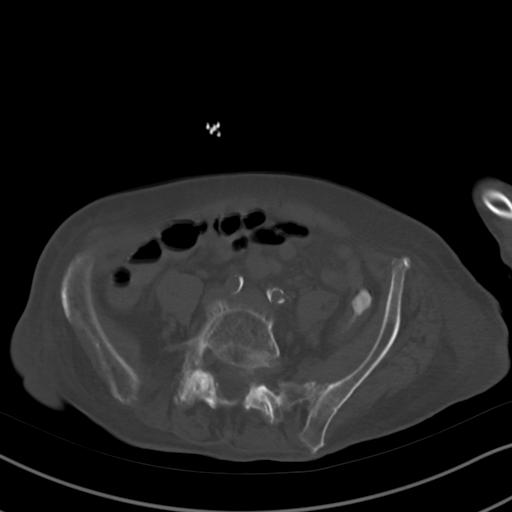
[im 7/52  soft-tissue]
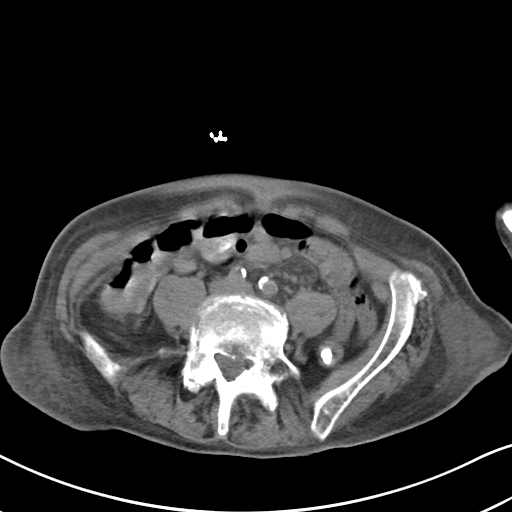
[im 10/52  soft-tissue]
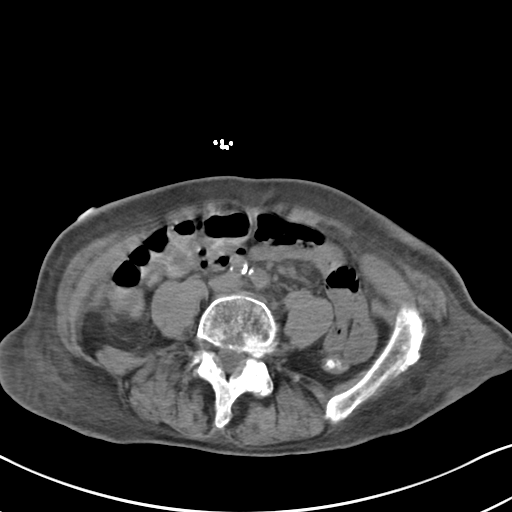
[im 16/52  soft-tissue]
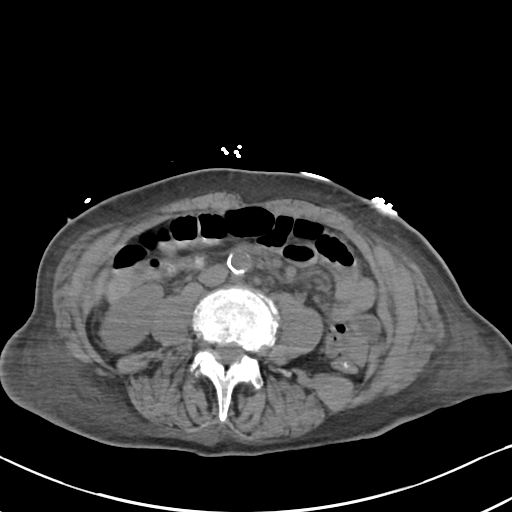
[im 20/52  soft-tissue]
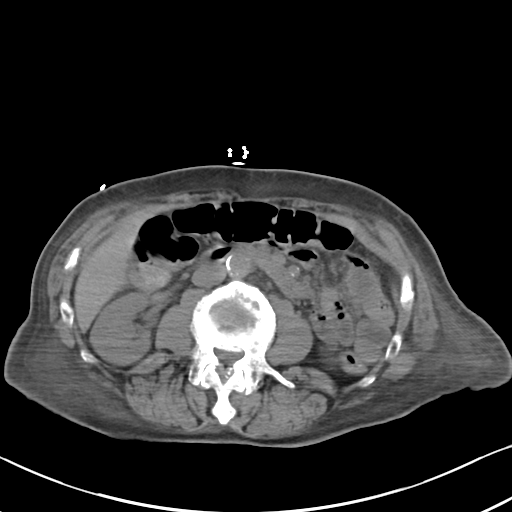
[im 23/52  soft-tissue]
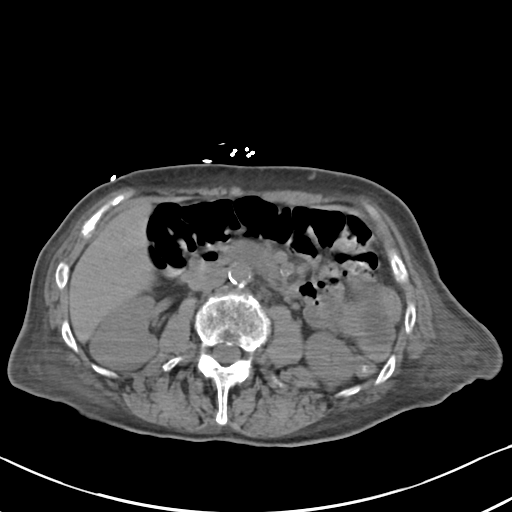
[im 26/52  soft-tissue]
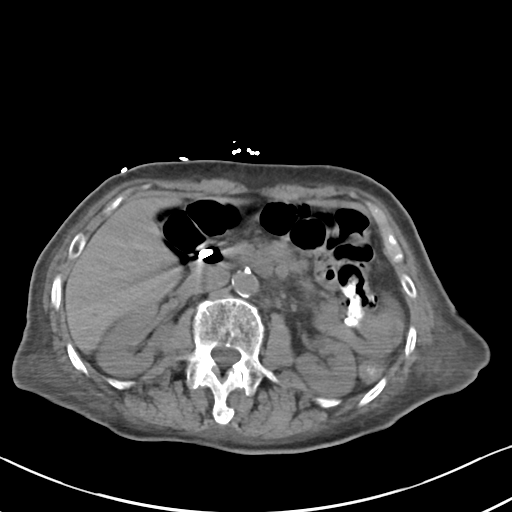
[im 29/52  soft-tissue]
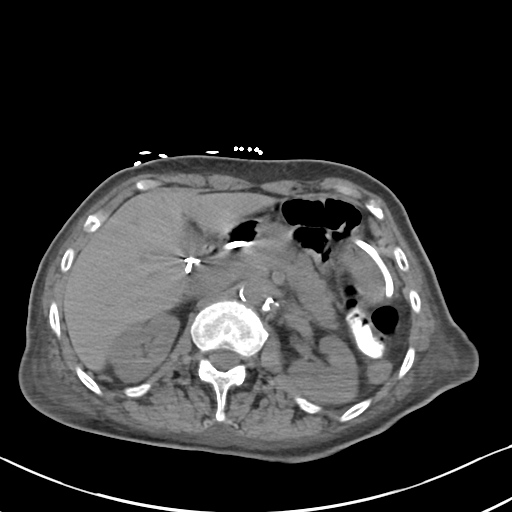
[im 32/52  soft-tissue]
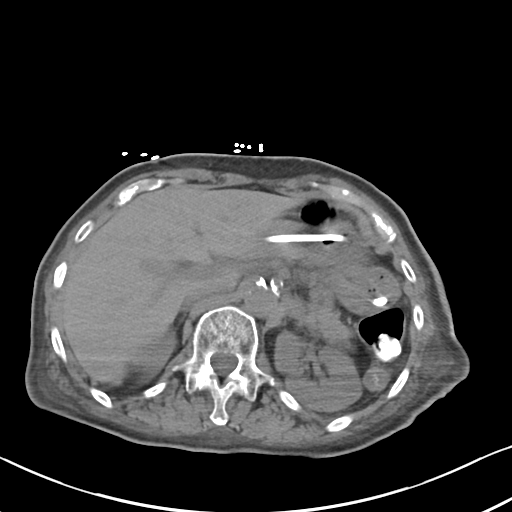
[im 32/52  bone]
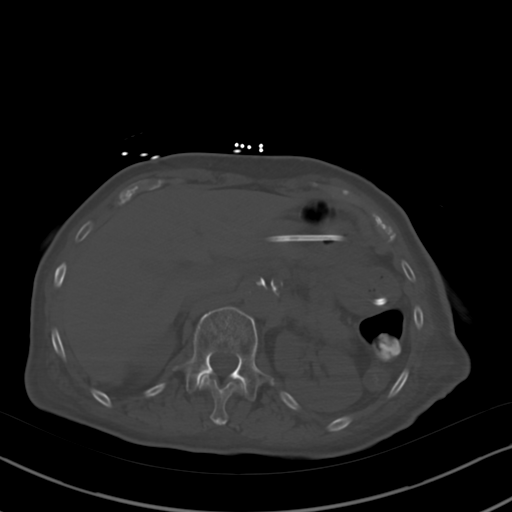
[im 36/52  soft-tissue]
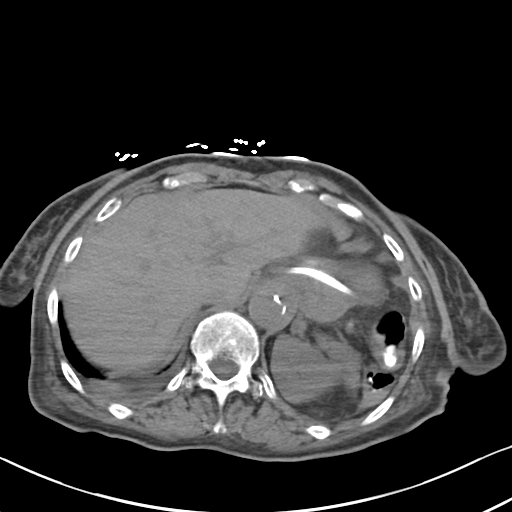
[im 42/52  soft-tissue]
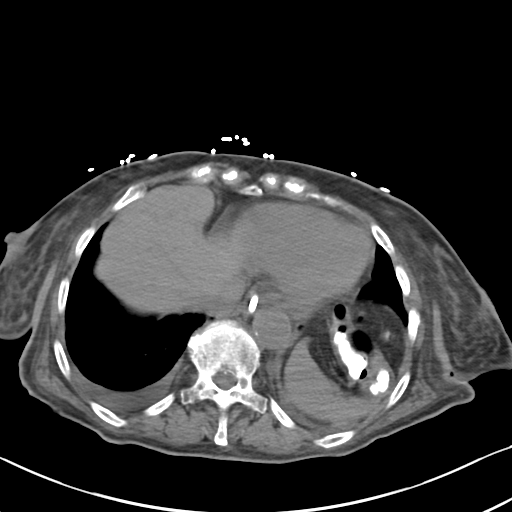
[im 45/52  soft-tissue]
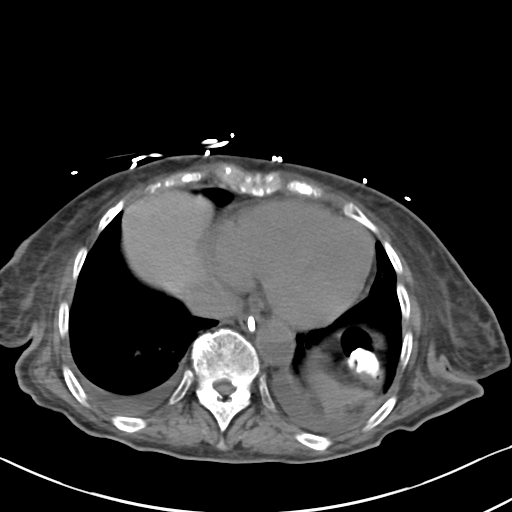
[im 48/52  soft-tissue]
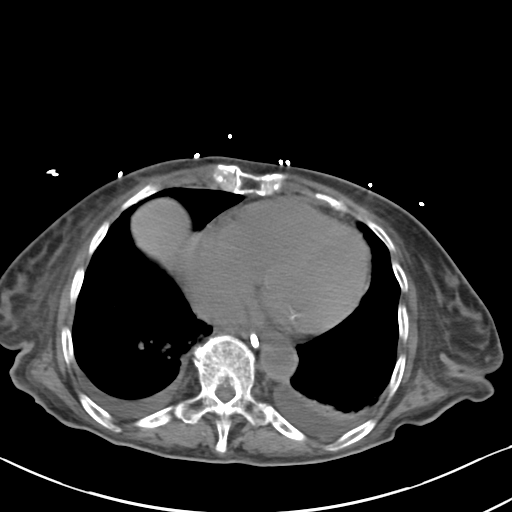

[Series 6: cor · coronal · 0.53mm/px · 3 of 75 slices shown]
[im 25/75  soft-tissue]
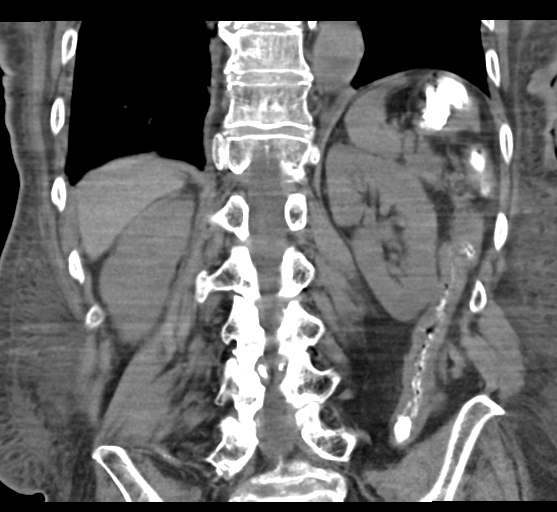
[im 33/75  soft-tissue]
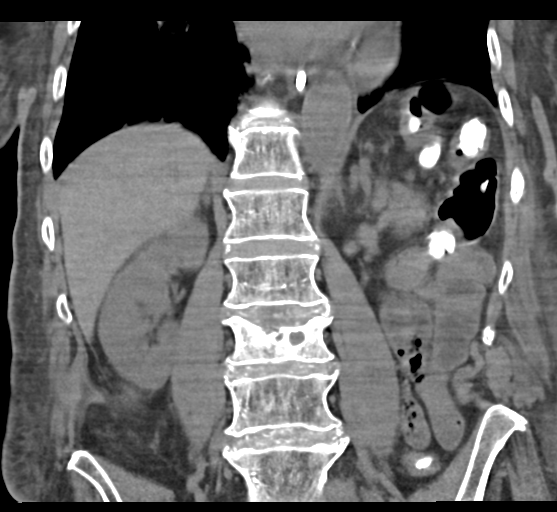
[im 42/75  soft-tissue]
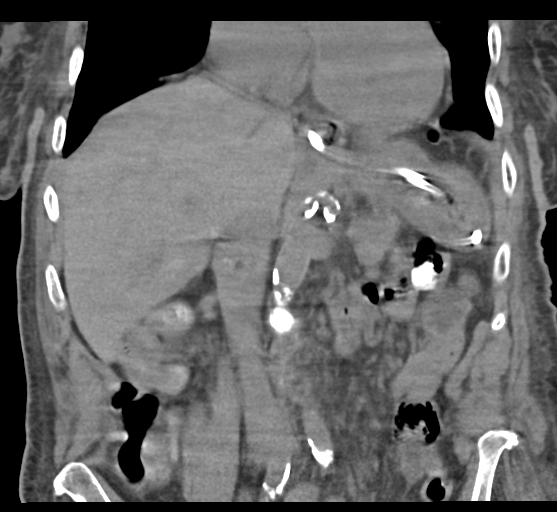

[16 of 46 positions shown; findings below may reference images not displayed]

FINDINGS: The lack of intravenous contrast limits the ability to evaluate
solid abdominal organs.

Lower chest: Limited visualization of the lower thorax demonstrates
trace/small bilateral effusions with associated bibasilar opacities.
Borderline cardiomegaly. No pericardial effusion.

Hepatobiliary: Normal hepatic contour. Post cholecystectomy. No
ascites.

Pancreas: Normal noncontrast appearance of the pancreas.

Spleen: Normal noncontrast appearance of the spleen.

Adrenals/Urinary Tract: Normal noncontrast appearance of the
bilateral kidneys. No renal stones. No urine obstruction or
perinephric stranding.

Normal noncontrast appearance the bilateral adrenal glands. The
urinary bladder was not imaged.

Stomach/Bowel: There is interposition of the transverse colon and
several loops of small bowel as well as a portion of the lateral
segment of the left lobe of the liver between the anterior wall of
the stomach and ventral wall of the upper abdomen.

Enteric tube tip terminates within the gastric antrum. Enteric
contrast is seen within the colon. No evidence of enteric
obstruction. No pneumoperitoneum, pneumatosis or portal venous gas.

Vascular/Lymphatic: Atherosclerotic plaque within a normal caliber
abdominal aorta.

No definitive bulky retroperitoneal or mesenteric adenopathy on this
noncontrast examination.

Other: Diffuse body wall anasarca.

Musculoskeletal: There is a moderate (approximately 50%) compression
deformity involving the superior endplate of the L3 vertebral body
which appears subacute with end plate sclerosis and without
associated paraspinal hematoma though new compared to the [DATE]
examination
IMPRESSION: 1. Suboptimal candidate for percutaneous gastrostomy tube placement
given interposition of the transverse colon, several loops of small
bowel as well as a portion of the lateral segment of the left lobe
of the liver - Potential strategies could include attempted
gastrostomy tube placement with barium administration (either via
the enteric tube or via an enema) with gastric insufflation versus
proceeding with definitive surgical consultation.
2. Moderate (approximately 30%) compression deformity involving the
superior endplate of the L3 vertebral body appears subacute though
new compared to the [DATE] examination. Correlation point
tenderness at this location is advised.
3.  Aortic Atherosclerosis ([9Y]-[9Y]).

## 2019-11-30 MED ORDER — ENOXAPARIN SODIUM 40 MG/0.4ML ~~LOC~~ SOLN
40.0000 mg | Freq: Every day | SUBCUTANEOUS | Status: DC
  Administered 2019-12-01 – 2019-12-19 (×18): 40 mg via SUBCUTANEOUS
  Filled 2019-11-30 (×19): qty 0.4

## 2019-11-30 NOTE — Progress Notes (Signed)
HD#13 Subjective:   No acute events overnight.   During rounds this morning, the patient was awake, alert, oriented to self, consistent with her baseline. Voice is still wet, patient unable to clear her secretions. Denies pain.   After rounds, called patient's daughter, Nicole Blackwell, to discuss obtaining gastric feeding tube as the patient is still not able to safely tolerate PO. Daughter interested in pursuing G tube, expresses hope that her mother will recover her ability to eat PO.   Objective:   Vital signs in last 24 hours: Vitals:   11/29/19 2000 11/30/19 0000 11/30/19 0337 11/30/19 0557  BP: (!) 155/77 140/74 122/61   Pulse: 85 80 81   Resp: 13 19 10    Temp: 98.3 F (36.8 C) 97.8 F (36.6 C) 97.9 F (36.6 C)   TempSrc: Oral Oral Axillary   SpO2: 96% 100% 99%   Weight:    57.9 kg  Height:    5\' 5"  (1.651 m)   Physical Exam: Gen: Laying in bed, appears comfortable, awake spontaneously Neuro: Alert and oriented to place HENT: Cortrak in place Resp: Voice is wet/gurgling, cough is wet and pt unable to cough up any secretions, good air movement, transmitted upper airway sounds Abd: soft, non tender Ext: warm, no edema   Pertinent Labs: CBC Latest Ref Rng & Units 11/28/2019 11/24/2019 11/23/2019  WBC 4.0 - 10.5 K/uL 9.9 9.2 9.9  Hemoglobin 12.0 - 15.0 g/dL 12.3 12.9 13.2  Hematocrit 36 - 46 % 39.1 40.9 42.1  Platelets 150 - 400 K/uL 382 301 274    CMP Latest Ref Rng & Units 11/29/2019 11/28/2019 11/27/2019  Glucose 70 - 99 mg/dL 111(H) 180(H) 143(H)  BUN 8 - 23 mg/dL 12 14 17   Creatinine 0.44 - 1.00 mg/dL 0.48 0.47 0.58  Sodium 135 - 145 mmol/L 137 137 138  Potassium 3.5 - 5.1 mmol/L 4.7 3.9 3.9  Chloride 98 - 111 mmol/L 99 101 102  CO2 22 - 32 mmol/L 29 28 30   Calcium 8.9 - 10.3 mg/dL 8.4(L) 8.4(L) 8.3(L)  Total Protein 6.5 - 8.1 g/dL - - 5.3(L)  Total Bilirubin 0.3 - 1.2 mg/dL - - 0.3  Alkaline Phos 38 - 126 U/L - - 54  AST 15 - 41 U/L - - 20  ALT 0 - 44 U/L - - 13    Mg: 2.0 Phos: 3.5  Assessment/Plan:   Principal Problem:   HCAP (healthcare-associated pneumonia) Active Problems:   Chronic diastolic CHF (congestive heart failure) (HCC)   Vascular dementia without behavioral disturbance (HCC)   Dementia (HCC)   Protein-calorie malnutrition, severe (HCC)   Severe sepsis (HCC)   Goals of care, counseling/discussion   Palliative care by specialist   DNR (do not resuscitate) discussion   Patient Summary: Nicole Blackwell is a 84 year old woman with past medical history significant for dementia, HTN, diastolic heart failure, seizure disorder (on Depakote) oropharyngeal dysphagia, who presented from skilled nursing facility with worsening dyspnea and imaging suggestive of multifocal pneumonia, R>L likely secondary to aspiration.   This is hospital day 14.  Acute hypoxic resp failure and Sepsis 2/2 multifocal PNA R>L Finished antibiotics, much improved. Pneumonia seems resolved, cough is stronger but still weak, remains an aspiration risk as below.  Moderate Diffuse Encephalopathy with Tremor History of Seizure Disorder  History of Dementia Patient's mental status improved and stable over the last five days. She is spontaneously awake, following commands. Tolerating trickle tube feeds. Planning for G tube as below. BP controlled and stable  on losartan 25 mg alone. Continuing amantadine per neurology.  - Valproate 250mg  q8h per tube - Amantadine 25 mg daily to help with mental status, rigidity, per tube - Losartan 25 mg daily  Dysphagia Chronic issue this admission, patient has also previously been on a pureed diet during previous hospitalization. SLP following. Had MBS on 9/25 and placed on dysphagia 2 diet with medications administered whole in pure. Cortrak placed 10/1 for tube feedings after patient 3 days NPO secondary to difficulty managing secretions. Patient with much improved mental status the last 5 days. MBS on 10/5 demonstrated even further  decreased swallow function compared to 9/25 MBS. SLP recommend continued NPO with fair/questionable prognosis for swallow. Daughter would like to pursue G tube placement for continued nutrition. - SLP following, appreciate recs             - Continue NPO but allow occasional ice chips (2-3) only after oral care - Cortrak for tube feedings placed 10/1 - Consult IR for G-tube placement   FEN/GI - Consult IR for G-tube placement  - Tube feeds via Cortrak, nutrition following             - Osmolite to goal rate of 50 ml/hr (1200 ml/day)             - ProSource 45 ml daily             - Free water flushes - Tube feeds will be paused for G tube per IR - Patient at risk for refeeding, will monitor daily Mg, K, and Phos - NPO due to aspiration risk  Goals of Care Disposition Patient will not be able to discharge while on tube feeds. As above, prognosis for swallow is fair/questionable. Discussed patient's progress with her [facility] PCP Nicole Blackwell) and social worker Nicole Blackwell) who are supportive of involving palliative care to faciliate goals of care conversations with patient's family. Palliative consulted and following. - Palliative Care following, appreciate their involvement and guidance             - Patient's family have specified full scope/full code, including placement of G tube for nutrition             - Agreeable to short-term rehab with goal of returning home             - Continue Code Status discussions - PT: recommend SNF - Patient is a [facility] patient   Diet: Tube feeds - will be paused prior to G tube placement per IR IVF: None,None VTE: Enoxaparin - dose day of G tube placement will be held per IR Code: Full PT/OT recs: SNF for Subacute PT   Dispo: Anticipated discharge to Skilled nursing facility pending placement of G-tube.  Please contact the on call pager after 5 pm and on weekends at [phone].  Alexandria Lodge, MD PGY-1 Internal Medicine Teaching  Service Pager: [phone] 11/30/2019

## 2019-11-30 NOTE — Plan of Care (Signed)

## 2019-11-30 NOTE — Progress Notes (Signed)
Nutrition Follow-up  DOCUMENTATION CODES:   Severe malnutrition in context of chronic illness  INTERVENTION:   Continue tube feeds via Cortrak: - Osmolite 1.2 @ 50 ml/hr (1200 ml/day) - ProSource 45 ml daily - Free water per MD, currently 200 ml q 8 hours  Tube feeding regimen provides 1480 kcal, 78 grams of protein, and 984 ml of H2O.  Total free water with flushes: 1584 ml  NUTRITION DIAGNOSIS:   Severe Malnutrition related to chronic illness (dementia, dysphagia, CHF) as evidenced by moderate fat depletion, severe muscle depletion, percent weight loss (20.8% weight loss in less than 1 year).  Ongoing  GOAL:   Patient will meet greater than or equal to 90% of their needs  Met via TF  MONITOR:   Diet advancement, Labs, Weight trends, TF tolerance, Skin, I & O's  REASON FOR ASSESSMENT:   Consult Enteral/tube feeding initiation and management  ASSESSMENT:   84 year old female who presented to the ED from SNF on 9/24 with worsening SOB. PMH of HTN, dementia, CHF, seizure disorder, oropharyngeal dysphagia. Pt admitted with sepsis secondary to multifocal HCAP with suspicion for aspiration.  09/25 - MBS with recommendation for dysphagia 2 diet with thin liquids 09/29 - NPO 10/01 - gastric Cortrak placed 10/05 - MBS showing worsening swallow function  Palliative Care has met with pt's family who would like full scope of treatment. Met with pt briefly at bedside. Pt moaning and attempting to communicate but RD was unable to understand what pt was trying to say.  Cortrak remains in place. Tube feeds off at time of RD visit. Discussed with RN who reports TF held for possible PEG placement. If pt unable to have PEG placement today, recommend restarting TF.  Weight trending up since last RD visit. Will continue to monitor trends.  Medications reviewed and include: senna, vitamin B-12  Labs reviewed. CBG's: 99-147 x 24 hours  Diet Order:   Diet Order            Diet  NPO time specified  Diet effective now                 EDUCATION NEEDS:   Not appropriate for education at this time  Skin:  Skin Assessment: Reviewed RN Assessment  Last BM:  11/28/19  Height:   Ht Readings from Last 1 Encounters:  11/30/19 5' 5"  (1.651 m)    Weight:   Wt Readings from Last 1 Encounters:  11/30/19 57.9 kg    Ideal Body Weight:  56.8 kg  BMI:  Body mass index is 21.24 kg/m.  Estimated Nutritional Needs:   Kcal:  1400-1600  Protein:  65-80 grams  Fluid:  1.4-1.6 L    Gaynell Face, MS, RD, LDN Inpatient Clinical Dietitian Please see AMiON for contact information.

## 2019-11-30 NOTE — Progress Notes (Signed)
  Speech Language Pathology Treatment: Dysphagia  Patient Details Name: Nicole Blackwell MRN: [id number] DOB: Dec 23, 1935 Today's Date: 11/30/2019 Time: 1655-3748 SLP Time Calculation (min) (ACUTE ONLY): 12 min  Assessment / Plan / Recommendation Clinical Impression  Pt seen for dysphagia treatment s/p repeat MBS 10/5 with findings of continued significant dysphagia. Suspect dysphagia is chronic impacted by cervical curvature. Her vocal quality continues to be significantly wet and audibly gurgling during phonation. Intervention included cues and demonstration for hard, effortful cough/throat clears, dry swallows, pitch modulation for laryngeal elevation and Masako Maneuver (tongue base retraction) attempted. She was unable to achieve adequate inhalation and vocal cord adduction or swallow on command. Ice chip then 1/2 teaspoon water trials given with delayed cough. Vocal quality slightly improved by end of session. Prognosis for swallow recovery appears fair-poor. Per notes, family wishes to pursue PEG. Continue ST.   HPI HPI: Nicole Blackwell is a 84 y.o. female with medical history significant for dementia with no behavioral disturbances, essential hypertension, chronic diastolic CHF, seizure disorder on Depakote, oropharyngeal dysphagia, who presented from SNF due to increasing shortness of breath. MBS 9/25: mild oropharyngeal dysphagia c/b trace, transient laryngeal penetration of thin liquid x1 on examination. EEG 9/29: moderate diffuse encephalopathy, nonspecific etiology but could be secondary to toxic-metabolic causes. Per Dr. Rebeca Alert' note on 9/30, pt's daughter, "Nicole Blackwell says Nicole Blackwell would definitely want life saving measures, even if the possibility of success were low." Pt with swallow decline and made NPO 9/27 after ST recommendation.  MBS 10/5 repeated to assess current function and NPO recommended.         SLP Plan  Continue with current plan of care       Recommendations  Diet  recommendations: NPO Medication Administration: Via alternative means                Oral Care Recommendations: Oral care QID Follow up Recommendations: Skilled Nursing facility SLP Visit Diagnosis: Dysphagia, oropharyngeal phase (R13.12) Plan: Continue with current plan of care                      Houston Siren 11/30/2019, 2:54 PM  Orbie Pyo Colvin Caroli.Ed Risk analyst [phone] Office [phone]

## 2019-12-01 DIAGNOSIS — J189 Pneumonia, unspecified organism: Secondary | ICD-10-CM | POA: Diagnosis not present

## 2019-12-01 DIAGNOSIS — Z515 Encounter for palliative care: Secondary | ICD-10-CM | POA: Diagnosis not present

## 2019-12-01 DIAGNOSIS — Z7189 Other specified counseling: Secondary | ICD-10-CM

## 2019-12-01 DIAGNOSIS — F0390 Unspecified dementia without behavioral disturbance: Secondary | ICD-10-CM | POA: Diagnosis not present

## 2019-12-01 LAB — GLUCOSE, CAPILLARY
Glucose-Capillary: 147 mg/dL — ABNORMAL HIGH (ref 70–99)
Glucose-Capillary: 124 mg/dL — ABNORMAL HIGH (ref 70–99)
Glucose-Capillary: 134 mg/dL — ABNORMAL HIGH (ref 70–99)
Glucose-Capillary: 112 mg/dL — ABNORMAL HIGH (ref 70–99)
Glucose-Capillary: 107 mg/dL — ABNORMAL HIGH (ref 70–99)
Glucose-Capillary: 117 mg/dL — ABNORMAL HIGH (ref 70–99)

## 2019-12-01 LAB — BASIC METABOLIC PANEL
Anion gap: 7 (ref 5–15)
BUN: 8 mg/dL (ref 8–23)
CO2: 29 mmol/L (ref 22–32)
Calcium: 8.9 mg/dL (ref 8.9–10.3)
Chloride: 103 mmol/L (ref 98–111)
Creatinine, Ser: 0.51 mg/dL (ref 0.44–1.00)
GFR calc non Af Amer: >60 mL/min (ref >60)
Glucose, Bld: 142 mg/dL — ABNORMAL HIGH (ref 70–99)
Potassium: 4.4 mmol/L (ref 3.5–5.1)
Sodium: 139 mmol/L (ref 135–145)

## 2019-12-01 LAB — PHOSPHORUS: Phosphorus: 3.7 mg/dL (ref 2.5–4.6)

## 2019-12-01 LAB — MAGNESIUM: Magnesium: 1.9 mg/dL (ref 1.7–2.4)

## 2019-12-01 MED ORDER — ENOXAPARIN SODIUM 40 MG/0.4ML ~~LOC~~ SOLN: 40.0000 mg | Freq: Every day | SUBCUTANEOUS | Status: DC

## 2019-12-01 NOTE — Progress Notes (Addendum)
Palliative: Mrs. Barthelemy is lying quietly in bed.  She appears chronically ill and frail, very thin.  She will open her eyes, tell me her name, but is unable to tell me where we are or have meaningful interactions.  She denies thirst or or hunger.  I do not believe she is able to make her basic needs known.  There is no family at bedside at this time.  Call to daughter, Odella Appelhans. [name] tells me that she is in the ED now for her own health issues.  We talked about IR declining/unable to place PEG tube due to anatomy.  We share that surgical placement would be very difficult for Mrs. Axelrod, and is not recommended due to her advanced dementia and poor likelihood of improvement.  [name] is very subdued during our conversation.  I shared that at this point we would recommend removing the temporary feeding tube and allowing the safest diet possible which would be pured.  I shared that it would be expected for Mrs. Johnstone to continue aspirating, and also be unable to meet her nutritional needs.  We talked about home with hospice care versus short-term rehab.  I share my concern that, at this point, Mrs. Rosasco cannot discharge to short-term rehab with a Dobbhoff/temporary feeding tube, and I also have concerns over her ability/desire to participate in rehab.  I asked [name] if she is experienced with hospice and she tells me that she is not.  I share that home with hospice would be a loving way to care for Mrs. Sanor.  [name] makes no response.  As we continue discussing Mrs. Nordby's declines [name] has longer and longer periods of silence.  At times almost a minute with no response.  I shared that these are hard choices, and [name] agrees.  We talked about CODE STATUS, continuing to treat the treatable but allowing natural passing.  [name] makes no response for over 1 minute.  I again shared that these are hard choices, and [name] states, "un hu", but no other statements are made. I share with [name] that I will give  her time to consider our conversation, choices about direction of care, and follow-up with her tomorrow.  She states agreement.   Conference with attending, bedside nursing staff, transition of care team related to patient condition, needs, conversation with daughter related to goals of care, CODE STATUS, short-term rehab versus home with hospice.  Mrs. Nelon is active with [facility] program.  They are usually very active in directing care, and will set limits upon LTC placement locations.  Plan:   At this point full scope/full code.  Considering CODE STATUS.  Understands that IR is not offering PEG tube placement and surgical placement would not be beneficial.   I question Mrs. Colledge's ability/desire to participate in rehab.  She would likely qualify for long-term care. She would benefit from at home hospice care if her family is able to provide in-home care in addition to her at home [facility] caregivers. Mrs. Zanetti would also clearly qualify for residential hospice with an albumin of 1.9.  40 minutes  Quinn Axe, NP Palliative medicine team Team phone [phone] Greater than 50% of this time was spent counseling and coordinating care related to the above assessment and plan.

## 2019-12-01 NOTE — Progress Notes (Signed)
HD#14 Subjective:   Overnight, CT abdomen pelvis wo contrast was obtained by IR to assess the patient's anatomy for percutaneous G tube placement. IR reviewed the scan and has recommended consulting surgery for open gastrostomy placement due to interposition of colon/small bowel/liver which would make percutaneous placement riskier and possibly even impossible.   Patient evaluated at bedside during rounds this morning. She was sleeping upon arrival at bedside, however was arousable to voice. Oriented to self and place. Has no complaints, denies pain.  Shortly after rounding on the patient, received message from our palliative care colleagues who do not recommend PEG for end-stage dementia but rather recommend comfort feedings. They informed us they would reach out to the patient's daughter to discuss further.  Objective:   Vital signs in last 24 hours: Vitals:   11/30/19 2300 12/01/19 0339 12/01/19 0800 12/01/19 1000  BP: 133/79 139/68 (!) 146/86 (!) 169/83  Pulse: 95 88 81 76  Resp: 19 20 20 16   Temp: 98.3 F (36.8 C) 97.6 F (36.4 C) 98 F (36.7 C) (!) 97.4 F (36.3 C)  TempSrc: Axillary Axillary  Axillary  SpO2: 98% 98% 96% 100%  Weight:  56.4 kg    Height:        Physical Exam Gen: Laying in bed, appears comfortable, sleeping but awakens to voice Neuro: Alert and oriented to self and place HENT: Cortrak in place Resp: Voice is wet/gurgling, coughis wet and pt unable to cough up any secretions, good air movement, transmitted upper airway sounds Abd: soft, non-distended Ext: warm, no edema   Pertinent Labs: CBC Latest Ref Rng & Units 11/28/2019 11/24/2019 11/23/2019  WBC 4.0 - 10.5 K/uL 9.9 9.2 9.9  Hemoglobin 12.0 - 15.0 g/dL 12.3 12.9 13.2  Hematocrit 36 - 46 % 39.1 40.9 42.1  Platelets 150 - 400 K/uL 382 301 274    CMP Latest Ref Rng & Units 12/01/2019 11/30/2019 11/29/2019  Glucose 70 - 99 mg/dL 142(H) 155(H) 111(H)  BUN 8 - 23 mg/dL 8 11 12   Creatinine 0.44 -  1.00 mg/dL 0.51 0.56 0.48  Sodium 135 - 145 mmol/L 139 140 137  Potassium 3.5 - 5.1 mmol/L 4.4 4.3 4.7  Chloride 98 - 111 mmol/L 103 104 99  CO2 22 - 32 mmol/L 29 28 29   Calcium 8.9 - 10.3 mg/dL 8.9 8.7(L) 8.4(L)  Total Protein 6.5 - 8.1 g/dL - - -  Total Bilirubin 0.3 - 1.2 mg/dL - - -  Alkaline Phos 38 - 126 U/L - - -  AST 15 - 41 U/L - - -  ALT 0 - 44 U/L - - -    Imaging: CT ABDOMEN WO CONTRAST  Result Date: 12/01/2019 CLINICAL DATA:  Evaluate gastric anatomy for potential percutaneous gastrostomy tube placement. EXAM: CT ABDOMEN WITHOUT CONTRAST TECHNIQUE: Multidetector CT imaging of the abdomen was performed following the standard protocol without IV contrast. COMPARISON:  CT abdomen pelvis-12/18/2018 FINDINGS: The lack of intravenous contrast limits the ability to evaluate solid abdominal organs. Lower chest: Limited visualization of the lower thorax demonstrates trace/small bilateral effusions with associated bibasilar opacities. Borderline cardiomegaly. No pericardial effusion. Hepatobiliary: Normal hepatic contour. Post cholecystectomy. No ascites. Pancreas: Normal noncontrast appearance of the pancreas. Spleen: Normal noncontrast appearance of the spleen. Adrenals/Urinary Tract: Normal noncontrast appearance of the bilateral kidneys. No renal stones. No urine obstruction or perinephric stranding. Normal noncontrast appearance the bilateral adrenal glands. The urinary bladder was not imaged. Stomach/Bowel: There is interposition of the transverse colon and several loops  of small bowel as well as a portion of the lateral segment of the left lobe of the liver between the anterior wall of the stomach and ventral wall of the upper abdomen. Enteric tube tip terminates within the gastric antrum. Enteric contrast is seen within the colon. No evidence of enteric obstruction. No pneumoperitoneum, pneumatosis or portal venous gas. Vascular/Lymphatic: Atherosclerotic plaque within a normal caliber  abdominal aorta. No definitive bulky retroperitoneal or mesenteric adenopathy on this noncontrast examination. Other: Diffuse body wall anasarca. Musculoskeletal: There is a moderate (approximately 50%) compression deformity involving the superior endplate of the L3 vertebral body which appears subacute with end plate sclerosis and without associated paraspinal hematoma though new compared to the 12/18/2018 examination IMPRESSION: 1. Suboptimal candidate for percutaneous gastrostomy tube placement given interposition of the transverse colon, several loops of small bowel as well as a portion of the lateral segment of the left lobe of the liver - Potential strategies could include attempted gastrostomy tube placement with barium administration (either via the enteric tube or via an enema) with gastric insufflation versus proceeding with definitive surgical consultation. 2. Moderate (approximately 30%) compression deformity involving the superior endplate of the L3 vertebral body appears subacute though new compared to the 11/2018 examination. Correlation point tenderness at this location is advised. 3.  Aortic Atherosclerosis (ICD10-I70.0). Electronically Signed   By: Sandi Mariscal M.D.   On: 12/01/2019 08:30    Assessment/Plan:   Principal Problem:   HCAP (healthcare-associated pneumonia) Active Problems:   Chronic diastolic CHF (congestive heart failure) (HCC)   Vascular dementia without behavioral disturbance (HCC)   Dementia (HCC)   Protein-calorie malnutrition, severe (HCC)   Severe sepsis (HCC)   Goals of care, counseling/discussion   Palliative care by specialist   DNR (do not resuscitate) discussion   Patient Summary:  Nicole Blackwell is a 84 year old woman with past medical history significant for dementia, HTN, diastolic heart failure, seizure disorder (on Depakote) oropharyngeal dysphagia, who presented from skilled nursing facility with worsening dyspnea and imaging suggestive of multifocal  pneumonia, R>L likely secondary to aspiration.   This is hospital day 14.   Dysphagia Chronic issue this admission, patienthas also previously been on a pureed diet during previous hospitalization.SLP following. Had MBS on 9/25 and placed on dysphagia 2 diet with medications administered whole in pure. Cortrak placed 10/1 for tube feedings after patient 3 days NPO secondary to difficulty managing secretions. Patient with much improved mental status the last6days. MBS on 10/5 demonstrated even further decreased swallow function compared to 9/25 MBS. SLP recommend continued NPO with fair/questionable prognosis for swallow. As below, daughter would like to pursue G tube placement for continued nutrition. IR consulted for G tube placement, however per IR's review of CT abdomen pelvis, patient is a poor candidate for percutaneous placement secondary to interposition of colon/small bowel/liver, recommend general surgery consult for open gastrostomy. Discussed with palliative who do not recommend PEG for end-stage dementia but rather recommend comfort feedings. They informed us they would reach out to the patient's daughter to discuss further given that an open gastrostomy is a much higher risk procedure.  - SLP following, appreciate recs - Continue NPO but allow occasional ice chips (2-3) only after oral care - Cortrak for trickle tube feedings placed 10/1. Per IR, patient is poor candidate percutaneous G tube placement. Will consult surgery for open gastrostomy pending palliative discussion with patient's family.  Goals of Care Disposition Patient will not be able to discharge while on tube feeds. As above, prognosis  for swallow is fair/questionable.Discussed patient's progress with her [facility] PCP Nicole Blackwell) and social worker Nicole Blackwell) who are supportive of involving palliative care to faciliate goals of care conversations with patient's family. Palliative consulted and  following. - Palliative Care following, appreciate their involvement and guidance  - Palliative care recommend comfort feedings for end-stage dementia  - Patient's family have specified full scope/full code, including placement of G tube for nutrition  - Awaiting family's decision regarding open gastrostomy - Patient agreeable to short-term rehab with goal of returning home - Continue Code Status discussions - PT: recommend SNF - Patient is a [facility] patient  Acute hypoxic resp failure and Sepsis 2/2 multifocal PNA R>L Finished antibiotics, much improved. Pneumonia seems resolved, cough is stronger but still weak, remains an aspiration risk as below.  Moderate Diffuse Encephalopathy with Tremor, resolved History of Seizure Disorder  History of Dementia Patient's mental status improved and stable over the last sixdays. She is spontaneously awake, following commands.Tolerating trickle tube feeds. Per our and palliative care's discussions with patient's family, family would like to pursue G tube placement as below. BP controlled and stable on losartan 25 mg. Continuing amantadine per neurology.  - Valproate 250mg  q8h per tube - Amantadine 25 mg daily to help with mental status, rigidity, per tube - Losartan 25 mg daily   FEN/GI - Tube feeds via Cortrak, nutrition following - Osmolite to goal rate of 50 ml/hr (1200 ml/day) - ProSource 45 ml daily - Free water flushes - NPO due to aspiration risk   Diet: Tube feeds - will be paused prior to G tube placement per IR IVF: None,None VTE: Enoxaparin - dose day of G tube placement will be held per IR Code: Full PT/OT recs: SNF for Subacute PT   Please contact the on call pager after 5 pm and on weekends at [phone].  Alexandria Lodge, MD PGY-1 Internal Medicine Teaching Service Pager: [phone] 12/01/2019

## 2019-12-01 NOTE — Progress Notes (Addendum)
Physical Therapy Treatment Patient Details Name: BETHA SHADIX MRN: [id number] DOB: February 20, 1936 Today's Date: 12/01/2019    History of Present Illness Ms. Temkin is a 84 year old woman with past medical history significant for dementia, HTN, diastolic heart failure, seizure disorder (on Depakote) oropharyngeal dysphagia, who presented from skilled nursing facility with worsening dyspnea and imaging suggestive of multifocal pneumonia, R>L likely secondary to aspiration.     PT Comments    Pt supine in bed with leg hanging over R bed rail.  Pt continues to require max +2 assistance.  Pt sat edge of bed with emphasis on forward weight shifting and maintaining sitting balance.  Attempted sit to stand transfer.  Pt is unable to achieve standing but did unweigh hip to partial standing with total +2.  Utilized bed pad to scoot to [facility] and positioned in chair like position this session.  Pt with wet no productive cough at end of session.  PTA encouraged coughing but due to dementia she lacks knowledge to spit sputum out.  Continue to recommend SNF placement at this time.     Follow Up Recommendations  SNF     Equipment Recommendations  Other (comment) (defer to next session.)    Recommendations for Other Services       Precautions / Restrictions Precautions Precautions: Fall Precaution Comments: Aspiration Precautions Restrictions Weight Bearing Restrictions: No    Mobility  Bed Mobility Overal bed mobility: Needs Assistance Bed Mobility: Supine to Sit;Sit to Supine     Supine to sit: Max assist;+2 for physical assistance Sit to supine: Max assist;+2 for physical assistance   General bed mobility comments: Pt attempting to move LEs to edge of bed.  She attempted to pull into sitting this session but lacks strength and required max +2 assistance to move into sitting at edge of bed.  Transfers Overall transfer level: Needs assistance Equipment used: None Transfers: Sit to/from  Stand;Lateral/Scoot Transfers Sit to Stand: Total assist;+2 physical assistance (but only clearing hips cannot achieve full standing.)        Lateral/Scoot Transfers: Total assist;+2 physical assistance General transfer comment: Total +2 assistance to move into partial standing, required lateral scoot to boost to [facility].  Ambulation/Gait Ambulation/Gait assistance:  (NT- unable to rise into standing.)               Stairs             Wheelchair Mobility    Modified Rankin (Stroke Patients Only)       Balance Overall balance assessment: Needs assistance Sitting-balance support: Feet supported Sitting balance-Leahy Scale: Poor Sitting balance - Comments: Mod assist for sitting up , able to maintain balance for brief periods of time. Postural control: Posterior lean (focused on weight shifting forward in seated position.)                                  Cognition Arousal/Alertness: Awake/alert Behavior During Therapy: WFL for tasks assessed/performed Overall Cognitive Status: No family/caregiver present to determine baseline cognitive functioning                                 General Comments: Pt interactive when spoken to this session.  Pt continues to require increased time to answer questions this session.  Baseline dementia      Exercises      General Comments  Pertinent Vitals/Pain Pain Assessment: Faces Faces Pain Scale: Hurts a little bit Pain Location: Generalized; Grimace with gross movement Pain Descriptors / Indicators: Grimacing Pain Intervention(s): Monitored during session;Repositioned    Home Living                      Prior Function            PT Goals (current goals can now be found in the care plan section) Acute Rehab PT Goals Patient Stated Goal: Did not state, agreeable to sitting up PT Goal Formulation: Patient unable to participate in goal setting Potential to Achieve Goals:  Fair Progress towards PT goals: Progressing toward goals    Frequency    Min 2X/week      PT Plan Current plan remains appropriate    Co-evaluation              AM-PAC PT "6 Clicks" Mobility   Outcome Measure  Help needed turning from your back to your side while in a flat bed without using bedrails?: Total Help needed moving from lying on your back to sitting on the side of a flat bed without using bedrails?: Total Help needed moving to and from a bed to a chair (including a wheelchair)?: Total Help needed standing up from a chair using your arms (e.g., wheelchair or bedside chair)?: Total Help needed to walk in hospital room?: Total Help needed climbing 3-5 steps with a railing? : Total 6 Click Score: 6    End of Session Equipment Utilized During Treatment: Gait belt (bed pan) Activity Tolerance: Patient tolerated treatment well Patient left: in bed;with call bell/phone within reach;with nursing/sitter in room Nurse Communication: Mobility status PT Visit Diagnosis: Other abnormalities of gait and mobility (R26.89);Muscle weakness (generalized) (M62.81)     Time: 2585-2778 PT Time Calculation (min) (ACUTE ONLY): 15 min  Charges:  $Therapeutic Activity: 8-22 mins                     Erasmo Leventhal , PTA Acute Rehabilitation Services Pager [phone] Office [phone]     Camdan Burdi Eli Hose 12/01/2019, 3:38 PM

## 2019-12-01 NOTE — Plan of Care (Signed)

## 2019-12-02 DIAGNOSIS — Z7189 Other specified counseling: Secondary | ICD-10-CM | POA: Diagnosis not present

## 2019-12-02 DIAGNOSIS — F0390 Unspecified dementia without behavioral disturbance: Secondary | ICD-10-CM | POA: Diagnosis not present

## 2019-12-02 DIAGNOSIS — J189 Pneumonia, unspecified organism: Secondary | ICD-10-CM | POA: Diagnosis not present

## 2019-12-02 DIAGNOSIS — Z515 Encounter for palliative care: Secondary | ICD-10-CM | POA: Diagnosis not present

## 2019-12-02 LAB — BASIC METABOLIC PANEL WITH GFR
Anion gap: 10 (ref 5–15)
BUN: 14 mg/dL (ref 8–23)
CO2: 29 mmol/L (ref 22–32)
Calcium: 9 mg/dL (ref 8.9–10.3)
Chloride: 101 mmol/L (ref 98–111)
Creatinine, Ser: 0.48 mg/dL (ref 0.44–1.00)
GFR, Estimated: >60 mL/min
Glucose, Bld: 119 mg/dL — ABNORMAL HIGH (ref 70–99)
Potassium: 4.2 mmol/L (ref 3.5–5.1)
Sodium: 140 mmol/L (ref 135–145)

## 2019-12-02 LAB — GLUCOSE, CAPILLARY
Glucose-Capillary: 120 mg/dL — ABNORMAL HIGH (ref 70–99)
Glucose-Capillary: 121 mg/dL — ABNORMAL HIGH (ref 70–99)
Glucose-Capillary: 127 mg/dL — ABNORMAL HIGH (ref 70–99)
Glucose-Capillary: 134 mg/dL — ABNORMAL HIGH (ref 70–99)
Glucose-Capillary: 141 mg/dL — ABNORMAL HIGH (ref 70–99)
Glucose-Capillary: 142 mg/dL — ABNORMAL HIGH (ref 70–99)

## 2019-12-02 LAB — PHOSPHORUS: Phosphorus: 3.9 mg/dL (ref 2.5–4.6)

## 2019-12-02 LAB — MAGNESIUM: Magnesium: 1.9 mg/dL (ref 1.7–2.4)

## 2019-12-02 NOTE — Progress Notes (Signed)
HD#15 Subjective:   O/N: No acute events  Ms. Enrique was seen and evaluated at bedside this AM. Patient is resting comfortably in bed. States that she is doing well, and would like to go back to sleep. Denies headaches, fevers, nausea or constipation/diarrhea  Objective:   Vital signs in last 24 hours: Vitals:   12/01/19 2200 12/01/19 2328 12/02/19 0200 12/02/19 0357  BP: (!) 142/70 140/72 133/69 (!) 147/81  Pulse: 83 81 81 92  Resp: 17 19 19 20   Temp:  98.9 F (37.2 C)  98.1 F (36.7 C)  TempSrc:  Oral  Oral  SpO2: 97% 99% 99% 97%  Weight:    55.5 kg  Height:        Physical Exam Constitutional:      General: She is not in acute distress.    Appearance: She is not toxic-appearing.  Cardiovascular:     Rate and Rhythm: Normal rate and regular rhythm.     Pulses: Normal pulses.     Heart sounds: Normal heart sounds. No murmur heard.  No friction rub. No gallop.   Pulmonary:     Effort: Pulmonary effort is normal.     Breath sounds: No wheezing, rhonchi or rales.  Abdominal:     General: Abdomen is flat. Bowel sounds are normal.     Tenderness: There is no abdominal tenderness. There is no guarding.  Neurological:     Mental Status: She is alert.    Pertinent Labs: CBC Latest Ref Rng & Units 11/28/2019 11/24/2019 11/23/2019  WBC 4.0 - 10.5 K/uL 9.9 9.2 9.9  Hemoglobin 12.0 - 15.0 g/dL 12.3 12.9 13.2  Hematocrit 36 - 46 % 39.1 40.9 42.1  Platelets 150 - 400 K/uL 382 301 274    CMP Latest Ref Rng & Units 12/02/2019 12/01/2019 11/30/2019  Glucose 70 - 99 mg/dL 119(H) 142(H) 155(H)  BUN 8 - 23 mg/dL 14 8 11   Creatinine 0.44 - 1.00 mg/dL 0.48 0.51 0.56  Sodium 135 - 145 mmol/L 140 139 140  Potassium 3.5 - 5.1 mmol/L 4.2 4.4 4.3  Chloride 98 - 111 mmol/L 101 103 104  CO2 22 - 32 mmol/L 29 29 28   Calcium 8.9 - 10.3 mg/dL 9.0 8.9 8.7(L)  Total Protein 6.5 - 8.1 g/dL - - -  Total Bilirubin 0.3 - 1.2 mg/dL - - -  Alkaline Phos 38 - 126 U/L - - -  AST 15 - 41 U/L - - -   ALT 0 - 44 U/L - - -    Imaging: No results found.  Assessment/Plan:   Principal Problem:   HCAP (healthcare-associated pneumonia) Active Problems:   Chronic diastolic CHF (congestive heart failure) (HCC)   Vascular dementia without behavioral disturbance (HCC)   Dementia (HCC)   Protein-calorie malnutrition, severe (HCC)   Severe sepsis (HCC)   Goals of care, counseling/discussion   Palliative care by specialist   DNR (do not resuscitate) discussion   Encounter for hospice care discussion   Patient Summary:  Ms. Dettmann is a 84 year old woman with past medical history significant for dementia, HTN, diastolic heart failure, seizure disorder (on Depakote) oropharyngeal dysphagia, who presented from skilled nursing facility with worsening dyspnea and imaging suggestive of multifocal pneumonia, R>L likely secondary to aspiration.   This is hospital day 14.   Dysphagia:  Chronic issue this admission, patienthas also previously been on a pureed diet during previous hospitalization.SLP following. Had MBS on 9/25 and placed on dysphagia 2 diet with medications  administered whole in pure. Cortrak placed 10/1 for tube feedings after patient 3 days NPO secondary to difficulty managing secretions. Patient with much improved mental status the last6days. MBS on 10/5 demonstrated even further decreased swallow function compared to 9/25 MBS. SLP recommend continued NPO with fair/questionable prognosis for swallow. As below, daughter would like to pursue G tube placement for continued nutrition. IR Unable to place tube, and palliative care discussed results with daughter and that the PEG tube placement via surgical route would be of no benefit.  - SLP following, appreciate recs - Continue NPO but allow occasional ice chips (2-3) only after oral care - Cortrak for trickle tube feedings placed 10/1.  - No PEG tube at this time.  - Palliative and PACE working en tandem for continued  [place] discussion.   Goals of Care Disposition Patient will not be able to discharge while on tube feeds. As above, prognosis for swallow is fair/questionable.Discussed patient's progress with her PACE PCP Linda Hedges) and social worker [name]) who are supportive of involving palliative care to faciliate goals of care conversations with patient's family. Palliative consulted and following. - Palliative Care following, appreciate their involvement and guidance  - Palliative care recommend comfort feedings for end-stage dementia  - Patient's family have specified full scope/full code - Patient agreeable to short-term rehab with goal of returning home - Continue Goals of care discussions.  - PT: recommend SNF - Patient is a PACE patient  Acute hypoxic resp failure and Sepsis 2/2 multifocal PNA R>L Finished antibiotics, much improved. Pneumonia seems resolved, cough is stronger but still weak, remains an aspiration risk as below.  Moderate Diffuse Encephalopathy with Tremor, resolved History of Seizure Disorder  History of Dementia Patient's mental status improved and stable over the last sixdays. She is spontaneously awake, following commands.Tolerating trickle tube feeds. Per our and palliative care's discussions with patient's family, family would like to pursue G tube placement as below. BP controlled and stable on losartan 25 mg. Continuing amantadine per neurology.  - Valproate 250mg  q8h per tube - Amantadine 25 mg daily to help with mental status, rigidity, per tube - Losartan 25 mg daily  FEN/GI - Tube feeds via Cortrak, nutrition following - Osmolite to goal rate of 50 ml/hr (1200 ml/day) - ProSource 45 ml daily - Free water flushes - NPO due to aspiration risk  Diet: Tube feeds - will be paused prior to G tube placement per IR IVF: None,None VTE: Enoxaparin - dose day of G tube placement will  be held per IR Code: Full PT/OT recs: SNF for Subacute PT   Please contact the on call pager after 5 pm and on weekends at [phone].  Maudie Mercury, MD IMTS, PGY-2 Pager: [phone] 12/02/2019,11:10 AM

## 2019-12-02 NOTE — Progress Notes (Signed)
Palliative: [name] is lying quietly in bed.  She remains acutely ill and very frail.  She is as usual oriented to self only.  I do not believe that she could make her basic needs known.  There is no family at bedside at this time.  Call to daughter, Nicole Blackwell.  No answer, unable to leave voicemail message.    PMT will continue to follow.  Discharge plan/goals of care discussions should continue with PACE PCP, Nicole Blackwell and social worker, Nicole Blackwell.   Plan:   At this point Nicole Blackwell remains full scope/full code.  She is not a candidate for PEG tube placement.       I question Nicole Blackwell's ability/desire to participate in rehab.  She would likely qualify for long-term care. She would benefit from at home hospice care if her family is able to provide in-home care in addition to her at home PACE caregivers. Nicole Blackwell would also clearly qualify for residential hospice with an albumin of 1.9.  15 minutes Nicole Axe, NP Palliative medicine team Team phone [phone] Greater than 50% of this time was spent counseling and coordinating care related to the above assessment and plan.

## 2019-12-03 LAB — CBC WITH DIFFERENTIAL/PLATELET
Abs Immature Granulocytes: 0.04 10*3/uL (ref 0.00–0.07)
Basophils Absolute: 0 10*3/uL (ref 0.0–0.1)
Basophils Relative: 1 %
Eosinophils Absolute: 0 10*3/uL (ref 0.0–0.5)
Eosinophils Relative: 1 %
HCT: 40.5 % (ref 36.0–46.0)
Hemoglobin: 12.7 g/dL (ref 12.0–15.0)
Immature Granulocytes: 1 %
Lymphocytes Relative: 28 %
Lymphs Abs: 2 10*3/uL (ref 0.7–4.0)
MCH: 26.6 pg (ref 26.0–34.0)
MCHC: 31.4 g/dL (ref 30.0–36.0)
MCV: 84.9 fL (ref 80.0–100.0)
Monocytes Absolute: 0.8 10*3/uL (ref 0.1–1.0)
Monocytes Relative: 12 %
Neutro Abs: 4.2 10*3/uL (ref 1.7–7.7)
Neutrophils Relative %: 57 %
Platelets: 543 10*3/uL — ABNORMAL HIGH (ref 150–400)
RBC: 4.77 MIL/uL (ref 3.87–5.11)
RDW: 15.7 % — ABNORMAL HIGH (ref 11.5–15.5)
WBC: 7.1 10*3/uL (ref 4.0–10.5)
nRBC: 0 % (ref 0.0–0.2)

## 2019-12-03 LAB — BASIC METABOLIC PANEL
Anion gap: 10 (ref 5–15)
BUN: 11 mg/dL (ref 8–23)
CO2: 28 mmol/L (ref 22–32)
Calcium: 8.8 mg/dL — ABNORMAL LOW (ref 8.9–10.3)
Chloride: 99 mmol/L (ref 98–111)
Creatinine, Ser: 0.41 mg/dL — ABNORMAL LOW (ref 0.44–1.00)
GFR, Estimated: >60 mL/min (ref >60)
Glucose, Bld: 140 mg/dL — ABNORMAL HIGH (ref 70–99)
Potassium: 4.2 mmol/L (ref 3.5–5.1)
Sodium: 137 mmol/L (ref 135–145)

## 2019-12-03 LAB — GLUCOSE, CAPILLARY
Glucose-Capillary: 127 mg/dL — ABNORMAL HIGH (ref 70–99)
Glucose-Capillary: 91 mg/dL (ref 70–99)
Glucose-Capillary: 118 mg/dL — ABNORMAL HIGH (ref 70–99)
Glucose-Capillary: 123 mg/dL — ABNORMAL HIGH (ref 70–99)

## 2019-12-03 LAB — PHOSPHORUS: Phosphorus: 3.8 mg/dL (ref 2.5–4.6)

## 2019-12-03 LAB — MAGNESIUM: Magnesium: 1.9 mg/dL (ref 1.7–2.4)

## 2019-12-03 NOTE — Progress Notes (Signed)
Very congested, coughing out phlegm thick tan color in mod amount. Oral suction done at frequent interval. Tube feeding held for 2 hours. Congestion subsided. Continue to monitor.

## 2019-12-03 NOTE — Progress Notes (Signed)
HD#16 Subjective:   Yesterday, palliative attempted to reach patient's daugther however there was no answer.  No acute events overnight.   Ms. Fiero was seen and evaluated at bedside this AM. She was asleep on our arrival however easily arousable to voice. States she is "good" this morning. Oriented to self. Denies pain.   Objective:   Vital signs in last 24 hours: Vitals:   12/02/19 1910 12/02/19 2300 12/03/19 0300 12/03/19 0803  BP: 127/66 (!) 164/86 133/70 140/70  Pulse: 79 95 81 73  Resp: 16 19 16 18   Temp: 98 F (36.7 C) 98.5 F (36.9 C) 97.8 F (36.6 C) 97.6 F (36.4 C)  TempSrc: Oral Oral Oral Oral  SpO2: 97% 96% 97% 97%  Weight:   53.3 kg   Height:       Physical Exam:  Gen: Laying in bed, appears comfortable, sleeping but awakens to voice Neuro: Alert and oriented to self HENT: Cortrak in place Resp: Voice is wet/gurgling, coughis wet and pt unable to cough up any secretions, good air movement, transmitted upper airway sounds Abd: soft, non-distended, non-tender Ext: warm, no edema  Pertinent Labs: CBC Latest Ref Rng & Units 12/03/2019 11/28/2019 11/24/2019  WBC 4.0 - 10.5 K/uL 7.1 9.9 9.2  Hemoglobin 12.0 - 15.0 g/dL 12.7 12.3 12.9  Hematocrit 36 - 46 % 40.5 39.1 40.9  Platelets 150 - 400 K/uL 543(H) 382 301    CMP Latest Ref Rng & Units 12/03/2019 12/02/2019 12/01/2019  Glucose 70 - 99 mg/dL 140(H) 119(H) 142(H)  BUN 8 - 23 mg/dL 11 14 8   Creatinine 0.44 - 1.00 mg/dL 0.41(L) 0.48 0.51  Sodium 135 - 145 mmol/L 137 140 139  Potassium 3.5 - 5.1 mmol/L 4.2 4.2 4.4  Chloride 98 - 111 mmol/L 99 101 103  CO2 22 - 32 mmol/L 28 29 29   Calcium 8.9 - 10.3 mg/dL 8.8(L) 9.0 8.9  Total Protein 6.5 - 8.1 g/dL - - -  Total Bilirubin 0.3 - 1.2 mg/dL - - -  Alkaline Phos 38 - 126 U/L - - -  AST 15 - 41 U/L - - -  ALT 0 - 44 U/L - - -   Mg: 1.9 Phos: 3.8  Assessment/Plan:   Principal Problem:   HCAP (healthcare-associated pneumonia) Active Problems:    Chronic diastolic CHF (congestive heart failure) (HCC)   Vascular dementia without behavioral disturbance (HCC)   Dementia (HCC)   Protein-calorie malnutrition, severe (HCC)   Severe sepsis (HCC)   Goals of care, counseling/discussion   Palliative care by specialist   DNR (do not resuscitate) discussion   Encounter for hospice care discussion   Patient Summary:  Ms. Stanis is a 84 year old woman with past medical history significant for dementia, HTN, diastolic heart failure, seizure disorder (on Depakote) oropharyngeal dysphagia, who presented from skilled nursing facility with worsening dyspnea and imaging suggestive of multifocal pneumonia, R>L likely secondary to aspiration.   This is hospital day 16.  Dysphagia:  Chronic issue this admission, patienthas also previously been on a pureed diet during previous hospitalization.S/p MBS on 9/25 and 10/5 which showed worsening swallow function. Patient with much improved mental status the lastweek. SLP recommend continued NPO with fair/questionable prognosis for swallow. As below, daughter would like to pursue G tube placement for continued nutrition. IR Unable to place tube, and palliative care discussed results with daughter and that the PEG tube placement via surgical route would be of no benefit.  - SLP following, appreciate recs -  Continue NPO but allow occasional ice chips (2-3) only after oral care - Cortrak for trickle tube feedings placed 10/1 - No PEG tube at this time - Palliative and PACE working en tandem for continued [place] discussion  Goals of Care Disposition Patient will not be able to discharge while on tube feeds. As above, prognosis for swallow is fair/questionable.Discussed patient's progress with her PACE PCP Linda Hedges) and social worker [name]) who are supportive of involving palliative care to faciliate goals of care conversations with patient's family. Palliative consulted and following. -  Palliative Care following, appreciate their involvement and guidance  - Palliative care recommend comfort feedings for end-stage dementia  - Patient's family have specified full scope/full code - Patient agreeable to short-term rehab with goal of returning home - Continue Goals of care discussions.  - PT: recommend SNF - Patient is a PACE patient  Acute hypoxic resp failure and Sepsis 2/2 multifocal PNA R>L Finished antibiotics, much improved. Pneumonia seems resolved, cough is stronger but still weak, remains an aspiration risk as below.  Moderate Diffuse Encephalopathy with Tremor, resolved History of Seizure Disorder  History of Dementia Patient's mental status improved and stable over the last weekdays. She is spontaneously awake, following commands.Tolerating trickle tube feeds. Per our and palliative care's discussions with patient's family, family would like to pursue G tube placement as below. BP controlled and stable on losartan 25 mg. Continuing amantadine per neurology.  - Valproate 250mg  q8h per tube - Amantadine 25 mg daily to help with mental status, rigidity, per tube - Losartan 25 mg daily  FEN/GI - Tube feeds via Cortrak, nutrition following - Osmolite to goal rate of 50 ml/hr (1200 ml/day) - ProSource 45 ml daily - Free water flushes - NPO due to aspiration risk  Diet: Tube feeds IVF: None VTE: Enoxaparin Code: Full PT/OT recs: SNF for Subacute PT  Please contact the on call pager after 5 pm and on weekends at [phone].  Alexandria Lodge, MD IMTS, PGY-1 Pager: [phone] 12/03/2019,8:47 AM

## 2019-12-04 DIAGNOSIS — Z7189 Other specified counseling: Secondary | ICD-10-CM | POA: Diagnosis not present

## 2019-12-04 DIAGNOSIS — E43 Unspecified severe protein-calorie malnutrition: Secondary | ICD-10-CM | POA: Diagnosis not present

## 2019-12-04 DIAGNOSIS — J189 Pneumonia, unspecified organism: Secondary | ICD-10-CM | POA: Diagnosis not present

## 2019-12-04 DIAGNOSIS — F0390 Unspecified dementia without behavioral disturbance: Secondary | ICD-10-CM | POA: Diagnosis not present

## 2019-12-04 DIAGNOSIS — Z515 Encounter for palliative care: Secondary | ICD-10-CM | POA: Diagnosis not present

## 2019-12-04 LAB — GLUCOSE, CAPILLARY
Glucose-Capillary: 102 mg/dL — ABNORMAL HIGH (ref 70–99)
Glucose-Capillary: 108 mg/dL — ABNORMAL HIGH (ref 70–99)
Glucose-Capillary: 109 mg/dL — ABNORMAL HIGH (ref 70–99)
Glucose-Capillary: 116 mg/dL — ABNORMAL HIGH (ref 70–99)
Glucose-Capillary: 121 mg/dL — ABNORMAL HIGH (ref 70–99)
Glucose-Capillary: 121 mg/dL — ABNORMAL HIGH (ref 70–99)

## 2019-12-04 NOTE — Progress Notes (Signed)
Speech Language Pathology Treatment: Dysphagia  Patient Details Name: Nicole Blackwell MRN: [id number] DOB: [birth year] Today's Date: 12/04/2019 Time: 1510-1530 SLP Time Calculation (min) (ACUTE ONLY): 20 min  Assessment / Plan / Recommendation Clinical Impression  Pt was seen for dysphagia treatment. She was alert and cooperative with increased verbal output compared to when she was last seen by this SLP on 10/1. Pt presented with a wet vocal quality at baseline. She was able to demonstrate coughing but this was ineffective in significantly improving vocal quality. Throat clearing and coughing were noted with even maximally restricted consistencies of puree solids and honey thick liquids, suggesting aspiration. Multiple swallows were observed, indicating continued pharyngeal residue as was demonstrated during the MBS on 10/5. Pt's swallow function does not appear significantly improved compared to when she was seen by speech pathology and she still does not present as a candidate for safe oral intake. Pt's family had expressed desire for G-tube placement; however, per EMR, IR is unable to place it. SLP would not advise G-tube placement considering the pt's baseline dementia. Pt's daughter called during the session and SLP was able to update her regarding the pt's swallow function. She stated that the pt did not have a history of dysphagia prior to admission. She stated that she was once of a dysphagia 1 (puree) diet in March but was never seen by SLP and was subsequently advance back to regular. [name] was educated regarding the results of the pt's most recent modified barium swallow study, her performance today, and her continued risk of aspiration as well as complications thereof. She verbalized understanding, but stated that the pt is improving significantly and is now talking. Pt's daughter was educated that improvement in communication and alertness is not necessarily indicative of improved swallow  function, and that the pt still appears at significant risk for aspiration. She expressed that she is hopeful that the pt will improve to the point of being able to consume a p.o. diet safely. SLP will continue to follow pt; however, prognosis for initiation of a p.o. diet is still judged to be fair-poor at this time.    HPI HPI: Nicole Blackwell is a 84 y.o. female with medical history significant for dementia with no behavioral disturbances, essential hypertension, chronic diastolic CHF, seizure disorder on Depakote, oropharyngeal dysphagia, who presented from SNF due to increasing shortness of breath. MBS 9/25: mild oropharyngeal dysphagia c/b trace, transient laryngeal penetration of thin liquid x1 on examination. EEG 9/29: moderate diffuse encephalopathy, nonspecific etiology but could be secondary to toxic-metabolic causes. Per Dr. Rebeca Alert' note on 9/30, pt's daughter, "[name] says Ms. Fortunato would definitely want life saving measures, even if the possibility of success were low." Pt with swallow decline and made NPO 9/27 after ST recommendation.  MBS 10/5 repeated to assess current function and NPO recommended.         SLP Plan  Continue with current plan of care       Recommendations  Diet recommendations: NPO Medication Administration: Via alternative means                Oral Care Recommendations: Oral care QID Follow up Recommendations: Skilled Nursing facility SLP Visit Diagnosis: Dysphagia, oropharyngeal phase (R13.12) Plan: Continue with current plan of care       Reeanna Acri I. Hardin Negus, [place], [place] Office number [phone] Pager [phone]                Horton Marshall 12/04/2019, 5:38 PM

## 2019-12-04 NOTE — Progress Notes (Signed)
Palliative: Chart reviewed.  Phone conference with [facility].  Conference with Passenger transport manager, and nurse practitioner, Nicole Blackwell.  They share that they have had multiple conversations with Nicole Blackwell's daughter, Nicole Blackwell, related to her care.  They share that Nicole Blackwell is very resistant to permanent placement in long-term care, but this is what [facility] has been recommending for quite some time now.  At this point they share their preferences for Nicole Blackwell to be placed in long-term care in one of their centers.  They asked that the [facility] social worker contacted their social worker, Nicole Blackwell.   The [facility] team and I talk at length about Nicole Blackwell's frailty, swallowing issues, nutritional issues.  Nicole Blackwell is not a candidate for IR/surgical PEG tube placement.  She is unable to safely take by mouth nutrition.  Although she currently has a core track for feeding, this is temporary.  If she is unable to safely take food by mouth, she is at end-of-life.  At this point, two provider DNR is recommended.  CPR and intubation would only cause suffering for Nicole Blackwell who is already suffering with advanced dementia.  Two provider DNR is discussed with [facility] and they are in agreement.  Call to daughter, Nicole Blackwell.  No answer, voicemail service not set up.  Lengthy secure chat with multidisciplinary team including attending, bedside nursing staff, speech therapy, social work.  Return call from Nicole Blackwell.  We talked about Nicole Blackwell's inability to safely eat and drink, take in nutrition.  Nicole Blackwell shares that she understood from nursing staff that her mother was "getting better, more awake and talkative".  Nicole Blackwell continues to share that she would like for her mother to go to short-term rehab and then return home.  We talked about what is next for Nicole Blackwell.  I share that placing in a nursing home does not change the fact that she can not eat safely.  I share that Nicole Blackwell would qualify for hospice  at this point because of her poor functional status, low albumin, inability to safely eat.  I asked Nicole Blackwell if she has ever known anybody who had hospice.  She tells me, "I thought hospice was at end of life", thankfully, she has a friend in the car with her who answers that if Nicole Blackwell is unable to safely eat and drink she is indeed at end-of-life.  Nicole Blackwell does admit that we have to eat to live.    Nicole Blackwell continues to state that she would like for her mother to go to short-term rehab.  We talked in detail about tube feeding, I advise that Nicole Blackwell cannot go with a temporary feeding tube.  Nicole Blackwell tells me that she understood the temporary tube feeding would go on for longer.  I shared that there is a set amount of time that someone can keep the temporary tube in the nose as this causes skin breakdown and sores.  Nicole Blackwell states that she is reluctant to stop nutrition because she feels that she would be causing her mother's death.  She states that she could somewhat see if her mother was on a "breathing machine".    We talked about CODE STATUS.  I share that when her heart naturally stops, that the medical team recommends that we would not press on her chest to try and restart it and not put her on life support.  I share that CPR and life support does not change anything with her eating  and swallowing, it does not change anything with her memory loss.  We talked about "allowing natural death", when God calls we let go.  Again, the friend that she has in the car shares that this will happen regardless of Nicole Blackwell's choice, and that is time she start accepting it.   At this point they stop talking and start plaing an order at [facility].  I share that I will reach out tomorrow.    [facility]:  [phone] UKGURKY Nicole Hedges, NP Social worker Nicole Basset, NP Nicole Blackwell is the center manager   Plan:   Recommend two provider DNR be completed.  Daughter continues to be reluctant for  anything other than full scope/full code.  Nicole Blackwell would clearly qualify for residential hospice with an albumin of 1.9. She would likely qualify for long-term care. She would benefit from at home hospice care if her family is able to provide in-home care in addition to her [facility]givers.  70 minutes, extended time Nicole Axe, NP Palliative medicine team Team phone [phone] Greater than 50% of this time was spent counseling and coordinating care related to the above assessment and plan.

## 2019-12-04 NOTE — Progress Notes (Signed)
HD#17 Subjective:   No acute events overnight.   Evaluated at bedside during rounds. Patient lying in bed, in no acute distress, spontaneously awake. She denies any pain or discomfort. When asked if she thinks she is having a harder time with her secretions, she says yes. Voice is wet, perhaps more congested today.  Objective:   Vital signs in last 24 hours: Vitals:   12/03/19 2017 12/03/19 2300 12/04/19 0300 12/04/19 0500  BP:  136/72 (!) 145/83   Pulse:  91 78   Resp:  20 18   Temp: 98.1 F (36.7 C) 97.7 F (36.5 C)    TempSrc: Oral Oral    SpO2:  100% 100%   Weight:    54.5 kg  Height:       Physical Exam:  Gen: Lying in bed, appears comfortable, awake and alert Neuro: Alert and oriented to self HENT: Cortrak in place Resp: Voice is wet/gurgling, coughseems wetter today, good air movement, transmitted upper airway sounds Abd: soft, non-distended, non-tender Ext: warm, no edema  Pertinent Labs: No new labs this morning CBC Latest Ref Rng & Units 12/03/2019 11/28/2019 11/24/2019  WBC 4.0 - 10.5 K/uL 7.1 9.9 9.2  Hemoglobin 12.0 - 15.0 g/dL 12.7 12.3 12.9  Hematocrit 36 - 46 % 40.5 39.1 40.9  Platelets 150 - 400 K/uL 543(H) 382 301    CMP Latest Ref Rng & Units 12/03/2019 12/02/2019 12/01/2019  Glucose 70 - 99 mg/dL 140(H) 119(H) 142(H)  BUN 8 - 23 mg/dL 11 14 8   Creatinine 0.44 - 1.00 mg/dL 0.41(L) 0.48 0.51  Sodium 135 - 145 mmol/L 137 140 139  Potassium 3.5 - 5.1 mmol/L 4.2 4.2 4.4  Chloride 98 - 111 mmol/L 99 101 103  CO2 22 - 32 mmol/L 28 29 29   Calcium 8.9 - 10.3 mg/dL 8.8(L) 9.0 8.9  Total Protein 6.5 - 8.1 g/dL - - -  Total Bilirubin 0.3 - 1.2 mg/dL - - -  Alkaline Phos 38 - 126 U/L - - -  AST 15 - 41 U/L - - -  ALT 0 - 44 U/L - - -    Assessment/Plan:   Principal Problem:   HCAP (healthcare-associated pneumonia) Active Problems:   Chronic diastolic CHF (congestive heart failure) (HCC)   Vascular dementia without behavioral disturbance (HCC)    Dementia (HCC)   Protein-calorie malnutrition, severe (HCC)   Severe sepsis (HCC)   Goals of care, counseling/discussion   Palliative care by specialist   DNR (do not resuscitate) discussion   Encounter for hospice care discussion   Patient Summary:  Ms. Bardales is a 84 year old woman with past medical history significant for dementia, HTN, diastolic heart failure, seizure disorder (on Depakote) oropharyngeal dysphagia, who presented from skilled nursing facility with worsening dyspnea and imaging suggestive of multifocal pneumonia, R>L likely secondary to aspiration.   This is hospital day 17.  Dysphagia:  Chronic issue this admission, patienthas also previously been on a pureed diet during previous hospitalization.S/p MBS on 9/25 and 10/5 which showed worsening swallow function. Mental status at baseline. SLP recommend continued NPO. As below, daughter would like to pursue G tube placement for continued nutrition. IR Unable to place tube, and palliative care discussed results with daughter and that the PEG tube placement via surgical route would be of no benefit.  - SLP following, appreciate recs - Continue NPO but allow occasional ice chips (2-3) only after oral care - Cortrak for trickle tube feedings placed 10/1 - No PEG tube  at this time - Palliative and [facility] working en tandem for continued [place] discussion  Goals of Care Disposition As above, prognosis for swallow is fair/questionable.Patient's [facility] PCP Linda Hedges) and social worker [name]) are aware and supportive palliative care efforts to engage patient's family in goals of care conversations. Palliative consulted and following, have not been able to reach patient's daughter since Friday, 10/8.  - Palliative Care following, appreciate their involvement and guidance - Recommend comfort feedings for end-stage dementia, note that patient would likely qualify for long-term care and could benefit  from home hospice care. Would also qualify for residential hospice. - Patient's family have specified full scope/full code - Continue Goals of care discussions.  - PT: recommend SNF - Patient is a [facility] patient  Acute hypoxic resp failure and Sepsis 2/2 multifocal PNA R>L Finished antibiotics, much improved. Pneumonia seems resolved, cough is stronger but still weak, remains an aspiration risk as below.  Moderate Diffuse Encephalopathy with Tremor, resolved History of Seizure Disorder  History of Dementia Patient's mental status improved and stable over the last week. She is spontaneously awake, following commands.Tolerating trickle tube feeds. Per our and palliative care's discussions with patient's family, family would like to pursue G tube placement. BP controlled and stable on losartan 25 mg. Continuing amantadine per neurology.  - Valproate 250mg  q8h per tube - Amantadine 25 mg daily to help with mental status, rigidity, per tube - Losartan 25 mg daily  FEN/GI - Tube feeds via Cortrak, nutrition following - Osmolite to goal rate of 50 ml/hr (1200 ml/day) - ProSource 45 ml daily - Free water flushes - NPO due to aspiration risk  Diet: Tube feeds IVF: None VTE: Enoxaparin Code: Full PT/OT recs: SNF for Subacute PT  Please contact the on call pager after 5 pm and on weekends at [phone].  Alexandria Lodge, MD IMTS, PGY-1 Pager: [phone] 12/04/2019,6:07 AM

## 2019-12-04 NOTE — Plan of Care (Signed)

## 2019-12-05 LAB — GLUCOSE, CAPILLARY
Glucose-Capillary: 112 mg/dL — ABNORMAL HIGH (ref 70–99)
Glucose-Capillary: 127 mg/dL — ABNORMAL HIGH (ref 70–99)
Glucose-Capillary: 129 mg/dL — ABNORMAL HIGH (ref 70–99)
Glucose-Capillary: 131 mg/dL — ABNORMAL HIGH (ref 70–99)
Glucose-Capillary: 134 mg/dL — ABNORMAL HIGH (ref 70–99)
Glucose-Capillary: 138 mg/dL — ABNORMAL HIGH (ref 70–99)

## 2019-12-05 NOTE — Progress Notes (Signed)
HD#18 Subjective:   No acute events overnight.   Evaluated at bedside during rounds. Patient is alert and oriented to self. Voice is stronger today, however she is still having difficulty managing her secretions.  Objective:   Vital signs in last 24 hours: Vitals:   12/04/19 1930 12/04/19 2300 12/04/19 2328 12/05/19 0306  BP: (!) 146/77 (!) 156/81 (!) 162/87 (!) 154/82  Pulse: 85 81 91 89  Resp: 19 19 16 18   Temp: 98.5 F (36.9 C)  98.2 F (36.8 C) 98.3 F (36.8 C)  TempSrc: Axillary  Axillary Oral  SpO2: 100% 100% 100% 98%  Weight:    52.4 kg  Height:       Physical Exam:  Gen: Lying in bed, appears comfortable, awake and alert Neuro: Alert and oriented to self HENT: Cortrak in place Resp: Voice is wet/gurgling, coughcontinues to be wet, good air movement, transmitted upper airway sounds Abd: soft, non-distended, non-tender Ext: warm, no edema  Pertinent Labs: No new labs this morning CBC Latest Ref Rng & Units 12/03/2019 11/28/2019 11/24/2019  WBC 4.0 - 10.5 K/uL 7.1 9.9 9.2  Hemoglobin 12.0 - 15.0 g/dL 12.7 12.3 12.9  Hematocrit 36 - 46 % 40.5 39.1 40.9  Platelets 150 - 400 K/uL 543(H) 382 301    CMP Latest Ref Rng & Units 12/03/2019 12/02/2019 12/01/2019  Glucose 70 - 99 mg/dL 140(H) 119(H) 142(H)  BUN 8 - 23 mg/dL 11 14 8   Creatinine 0.44 - 1.00 mg/dL 0.41(L) 0.48 0.51  Sodium 135 - 145 mmol/L 137 140 139  Potassium 3.5 - 5.1 mmol/L 4.2 4.2 4.4  Chloride 98 - 111 mmol/L 99 101 103  CO2 22 - 32 mmol/L 28 29 29   Calcium 8.9 - 10.3 mg/dL 8.8(L) 9.0 8.9  Total Protein 6.5 - 8.1 g/dL - - -  Total Bilirubin 0.3 - 1.2 mg/dL - - -  Alkaline Phos 38 - 126 U/L - - -  AST 15 - 41 U/L - - -  ALT 0 - 44 U/L - - -    Assessment/Plan:   Principal Problem:   HCAP (healthcare-associated pneumonia) Active Problems:   Chronic diastolic CHF (congestive heart failure) (HCC)   Vascular dementia without behavioral disturbance (HCC)   Dementia (HCC)   Protein-calorie  malnutrition, severe (HCC)   Severe sepsis (HCC)   Goals of care, counseling/discussion   Palliative care by specialist   DNR (do not resuscitate) discussion   Encounter for hospice care discussion   Patient Summary:  Nicole Blackwell is a 84 year old woman with past medical history significant for dementia, HTN, diastolic heart failure, seizure disorder (on Depakote) oropharyngeal dysphagia, who presented from skilled nursing facility with worsening dyspnea and imaging suggestive of multifocal pneumonia, R>L likely secondary to aspiration.   This is hospital day 18.  Dysphagia:  Chronic issue this admission, patienthas also previously been on a pureed diet during previous hospitalization.S/p MBS on 9/25 and 10/5 which showed worsening swallow function. Mental status at baseline. SLP recommend continued NPO, note that patient's prognosis for initiation of PO diet is fair-poor. As below, daughter would like to pursue G tube placement for continued nutrition. IR Unable to place tube, and palliative care discussed results with daughter and that the PEG tube placement via surgical route would be of no benefit.  - SLP following, appreciate recs  - prognosis for initiation of PO diet is fair-poor  - Continue NPO - Cortrak for trickle tube feedings placed 10/1 - Palliative and [facility] working  en tandem for continued [place] discussion  Goals of Care Disposition As above, prognosis for initiation of PO diet is fair-poor. Patient's [facility] PCP Nicole Blackwell) and social worker Nicole Blackwell) are aware and supportive of palliative care efforts to engage patient's family in goals of care conversations. Palliative consulted and following. - Palliative Care following, appreciate their involvement and guidance - Patient would qualify for residential hospice with albumin of 1.9 and would likely qualify for long-term care and could benefit from home hospice care.  - Patient's family continue to  be reluctant for anything other than full scope/full code.  - Recommend two provider DNR - PT: patient is not a candidate for continued rehab; will need long-term care vs hospice - Patient is a [facility]ACE patient  Acute hypoxic resp failure and Sepsis 2/2 multifocal PNA R>L Finished antibiotics, much improved. Pneumonia seems resolved, cough is stronger but still weak, remains an aspiration risk as below.  Moderate Diffuse Encephalopathy with Tremor, resolved History of Seizure Disorder  History of Dementia Patient's mental status improved and stable over the last week. She is spontaneously awake, following commands.Tolerating trickle tube feeds. Per our and palliative care's discussions with patient's family, family would like to pursue G tube placement. BP controlled and stable on losartan 25 mg. Continuing amantadine per neurology.  - Valproate 250mg  q8h per tube - Amantadine 25 mg daily to help with mental status, rigidity, per tube - Losartan 25 mg daily  FEN/GI - Tube feeds via Cortrak, nutrition following - Osmolite to goal rate of 50 ml/hr (1200 ml/day) - ProSource 45 ml daily - Free water flushes - NPO due to aspiration risk  Diet: Tube feeds IVF: None VTE: Enoxaparin Code: Full PT/OT recs: Long term care vs hospice  Please contact the on call pager after 5 pm and on weekends at [phone].  Alexandria Lodge, MD IMTS, PGY-1 Pager: [phone] 12/05/2019,5:43 AM

## 2019-12-05 NOTE — Progress Notes (Signed)
Physical Therapy Treatment Patient Details Name: Nicole Blackwell MRN: [id number] DOB: 03/12/1935 Today's Date: 12/05/2019    History of Present Illness Nicole Blackwell is a 84 year old woman with past medical history significant for dementia, HTN, diastolic heart failure, seizure disorder (on Depakote) oropharyngeal dysphagia, who presented from skilled nursing facility with worsening dyspnea and imaging suggestive of multifocal pneumonia, R>L likely secondary to aspiration.     PT Comments    Pt with no progress with mobility or ability to actively participate in therapy. With progression of dementia and corresponding decline in mobility do not feel pt is a candidate for continued rehab. Will need long term care vs hospice. Throughout session pt with extremely wet voice and unable to clear secretions.    Follow Up Recommendations  Other (comment) (long term care vs hospice)     Equipment Recommendations       Recommendations for Other Services       Precautions / Restrictions Precautions Precautions: Fall Precaution Comments: Aspiration Precautions    Mobility  Bed Mobility Overal bed mobility: Needs Assistance Bed Mobility: Supine to Sit;Sit to Supine;Rolling Rolling: Max assist   Supine to sit: Max assist;+2 for physical assistance Sit to supine: +2 for physical assistance;Total assist   General bed mobility comments: Assist for all aspects  Transfers                 General transfer comment: Did not attempt. Pt requiring support for sitting  Ambulation/Gait                 Stairs             Wheelchair Mobility    Modified Rankin (Stroke Patients Only)       Balance Overall balance assessment: Needs assistance Sitting-balance support: Feet supported Sitting balance-Leahy Scale: Poor Sitting balance - Comments: Sat EOB x 5-6 minutes with mod assist  Postural control: Posterior lean                                  Cognition  Arousal/Alertness: Awake/alert Behavior During Therapy: WFL for tasks assessed/performed Overall Cognitive Status: No family/caregiver present to determine baseline cognitive functioning                                 General Comments: Pt attempts to speak but voice extremely wet and gurgly due to swallowing issues. Didn't follow commands.       Exercises      General Comments        Pertinent Vitals/Pain Pain Assessment: Faces Faces Pain Scale: No hurt    Home Living                      Prior Function            PT Goals (current goals can now be found in the care plan section) Progress towards PT goals: Not progressing toward goals - comment    Frequency           PT Plan Discharge plan needs to be updated    Co-evaluation              AM-PAC PT "6 Clicks" Mobility   Outcome Measure  Help needed turning from your back to your side while in a flat bed without using bedrails?: Total Help needed moving from lying  on your back to sitting on the side of a flat bed without using bedrails?: Total Help needed moving to and from a bed to a chair (including a wheelchair)?: Total Help needed standing up from a chair using your arms (e.g., wheelchair or bedside chair)?: Total Help needed to walk in hospital room?: Total Help needed climbing 3-5 steps with a railing? : Total 6 Click Score: 6    End of Session   Activity Tolerance: Other (comment) (limited ability to participate) Patient left: in bed;with call bell/phone within reach;with bed alarm set Nurse Communication: Mobility status PT Visit Diagnosis: Other abnormalities of gait and mobility (R26.89);Muscle weakness (generalized) (M62.81)     Time: 2820-6015 PT Time Calculation (min) (ACUTE ONLY): 15 min  Charges:  $Therapeutic Activity: 8-22 mins                     [place] Pager [phone] Office [place] 12/05/2019, 10:06 AM

## 2019-12-06 DIAGNOSIS — R1312 Dysphagia, oropharyngeal phase: Secondary | ICD-10-CM

## 2019-12-06 DIAGNOSIS — E43 Unspecified severe protein-calorie malnutrition: Secondary | ICD-10-CM | POA: Diagnosis not present

## 2019-12-06 DIAGNOSIS — I5032 Chronic diastolic (congestive) heart failure: Secondary | ICD-10-CM | POA: Diagnosis not present

## 2019-12-06 DIAGNOSIS — Z7189 Other specified counseling: Secondary | ICD-10-CM | POA: Diagnosis not present

## 2019-12-06 DIAGNOSIS — J189 Pneumonia, unspecified organism: Secondary | ICD-10-CM | POA: Diagnosis not present

## 2019-12-06 DIAGNOSIS — Z515 Encounter for palliative care: Secondary | ICD-10-CM | POA: Diagnosis not present

## 2019-12-06 DIAGNOSIS — R5381 Other malaise: Secondary | ICD-10-CM

## 2019-12-06 DIAGNOSIS — F0390 Unspecified dementia without behavioral disturbance: Secondary | ICD-10-CM | POA: Diagnosis not present

## 2019-12-06 DIAGNOSIS — J9611 Chronic respiratory failure with hypoxia: Secondary | ICD-10-CM

## 2019-12-06 LAB — GLUCOSE, CAPILLARY
Glucose-Capillary: 130 mg/dL — ABNORMAL HIGH (ref 70–99)
Glucose-Capillary: 130 mg/dL — ABNORMAL HIGH (ref 70–99)
Glucose-Capillary: 142 mg/dL — ABNORMAL HIGH (ref 70–99)

## 2019-12-06 MED ORDER — OSMOLITE 1.2 CAL PO LIQD
1000.0000 mL | ORAL | Status: DC
  Administered 2019-12-06 – 2019-12-08 (×4): 1000 mL
  Filled 2019-12-06 (×5): qty 1000

## 2019-12-06 NOTE — Consult Note (Signed)
Medical Consultation   Nicole Blackwell  PJK:[id number]  DOB: 08/18/1935  DOA: 11/17/2019  PCP: Patient, No Pcp Per  Outpatient Specialists: None    Requesting physician: Dr. Heber [place]  Reason for consultation: Goal of care  History of Present Illness: Nicole Blackwell is an 84 y.o. female with history of dementia, diastolic CHF, seizure disorder, oropharyngeal dysphagia and HTN presenting from SNF with worsening dyspnea.  She was admitted with acute hypoxemic respiratory failure and sepsis due to multifocal pneumonia/aspiration pneumonia.  Patient was treated with IV antibiotics including vancomycin, Levaquin and cefepime from 11/17/2019-11/20/2019.  She was evaluated by SLP and had MBS on 9/25 and 10/5 which showed worsening swallowing function at baseline mental status.  SLP continue to recommend n.p.o. Family expressed interested G-tube for feeding.  IR consulted but unable to place tube due to difficult anatomy.  Cortrak placed on 11/24/2019, and she was a started on trickle tube feeding.  Given patient's comorbidities including advanced dementia, severe dysphagia and continued aspiration and difficulty to clear airway, palliative medicine consulted, and recommended DNR/DNI.  However, patient's family resistant to this, asking for everything done including full code with full scope of care. [facility] service was called to weigh in.   Patient was not able to provide history.  She only responds no to pain.  She seems to have significant separation with gurgling sounds and intermittent cough.  Talk to patient's daughter, [name] over the phone. Per [name], patient is dependent with most ADLs at baseline.  She is wheelchair-bound.  She goes to [employer] daily. She tries to feed herself at [facility].  Wears depends at baseline.  I have discussed about current situation, pros and cons of feeding tube and aggressive measures such as CPR and intubation.  Feeding tube does not decrease the  risk of aspiration. Also discussed about my concern risk of surgical complication.  Similarly, CPR and intubation would pose more harm than benefit given his situation. [name] voices understanding but seems to struggle to make a decision.  She says she would like to discuss with her father (patient's husband).    Review of Systems:  ROS Not able to perform review of system due to patient's mental status  Past Medical History: Past Medical History:  Diagnosis Date  . BACK PAIN   . Dementia ([place])   . Hypertension   . OSTEOPOROSIS   . PLANTAR FASCIITIS   . Positive PPD   . Seizures ([place])   . SHINGLES   . SYMPTOMATIC MENOPAUSAL/FEMALE CLIMACTERIC STATES   . Syncope and collapse 12/13/2014  . Uterine cancer [facility] [place])     Past Surgical History: Past Surgical History:  Procedure Laterality Date  . CATARACT EXTRACTION, BILATERAL Bilateral   . DILATION AND CURETTAGE OF UTERUS  1960's X 2   "I lost 2 children to miscarriage"  . LAPAROSCOPIC CHOLECYSTECTOMY    . SIGMOIDOSCOPY    . TONSILLECTOMY    . VAGINAL HYSTERECTOMY       Allergies:   Allergies  Allergen Reactions  . Penicillin G Benzathine Swelling    Did it involve swelling of the face/tongue/throat, SOB, or low BP? Unknown Did it involve sudden or severe rash/hives, skin peeling, or any reaction on the inside of your mouth or nose? Unknown Did you need to seek medical attention at a hospital or doctor's office? Unknown When did it last happen?unknown If all above answers are "NO", may  proceed with cephalosporin use.  Marland Kitchen Penicillins Swelling    Did it involve swelling of the face/tongue/throat, SOB, or low BP? Unknown Did it involve sudden or severe rash/hives, skin peeling, or any reaction on the inside of your mouth or nose? Unknown Did you need to seek medical attention at a hospital or doctor's office? Unknown When did it last happen?unknown If all above answers are "NO", may proceed with cephalosporin use.      Social History:  reports that she has never smoked. She has never used smokeless tobacco. She reports that she does not drink alcohol and does not use drugs.   Family History: Family History  Problem Relation Age of Onset  . Healthy Mother   . Healthy Father     Physical Exam: Vitals:   12/06/19 0334 12/06/19 0900 12/06/19 1200 12/06/19 1630  BP: 130/63 127/68 129/64   Pulse:  84 86 91  Resp:  (!) 23 20 (!) 23  Temp: 98.6 F (37 C) (!) 97.5 F (36.4 C)    TempSrc: Axillary Axillary    SpO2:  (!) 86% 93% 95%  Weight: 54.5 kg     Height:        Constitutional - resting comfortably, no acute distress Eyes - vision grossly intact. Sclera anicteric. PERRL.  Nose - no gross deformity or drainage. Cortrak in place Mouth - no oral lesions noted Throat - no swelling or erythema Endo - no obvious thyromegaly CV -HR in 100s.  (+)S1S2, no murmurs; no JVD or peripheral edema Resp -no IWOB.  Rhonchi with upper airway sound and gurgling GI - (+)BS, soft, non-tender, non-distended MSK -significant muscle mass and subcu fat loss. Skin - no obvious rashes or lesions Neuro -awake but not quite alert.  No focal neuro deficit but limited exam due to patient's inability to cooperate Psych - calm.  No distress or agitation.   Data reviewed:  I have personally reviewed following labs and imaging studies Labs:  CBC: Recent Labs  Lab 12/03/19 0726  WBC 7.1  NEUTROABS 4.2  HGB 12.7  HCT 40.5  MCV 84.9  PLT 543*    Basic Metabolic Panel: Recent Labs  Lab 11/30/19 0638 11/30/19 0638 12/01/19 0014 12/01/19 0014 12/02/19 0057 12/03/19 0726  NA 140  --  139  --  140 137  K 4.3   < > 4.4   < > 4.2 4.2  CL 104  --  103  --  101 99  CO2 28  --  29  --  29 28  GLUCOSE 155*  --  142*  --  119* 140*  BUN 11  --  8  --  14 11  CREATININE 0.56  --  0.51  --  0.48 0.41*  CALCIUM 8.7*  --  8.9  --  9.0 8.8*  MG 2.0  --  1.9  --  1.9 1.9  PHOS 3.5  --  3.7  --  3.9 3.8   < > =  values in this interval not displayed.   GFR Estimated Creatinine Clearance: 46.6 mL/min (A) (by C-G formula based on SCr of 0.41 mg/dL (L)). Liver Function Tests: No results for input(s): AST, ALT, ALKPHOS, BILITOT, PROT, ALBUMIN in the last 168 hours. No results for input(s): LIPASE, AMYLASE in the last 168 hours. No results for input(s): AMMONIA in the last 168 hours. Coagulation profile No results for input(s): INR, PROTIME in the last 168 hours.  Cardiac Enzymes: No results for input(s): CKTOTAL, CKMB,  CKMBINDEX, TROPONINI in the last 168 hours. BNP: Invalid input(s): POCBNP CBG: Recent Labs  Lab 12/05/19 1649 12/05/19 2020 12/06/19 0002 12/06/19 0432 12/06/19 0730  GLUCAP 129* 134* 130* 130* 142*   D-Dimer No results for input(s): DDIMER in the last 72 hours. Hgb A1c No results for input(s): HGBA1C in the last 72 hours. Lipid Profile No results for input(s): CHOL, HDL, LDLCALC, TRIG, CHOLHDL, LDLDIRECT in the last 72 hours. Thyroid function studies No results for input(s): TSH, T4TOTAL, T3FREE, THYROIDAB in the last 72 hours.  Invalid input(s): FREET3 Anemia work up No results for input(s): VITAMINB12, FOLATE, FERRITIN, TIBC, IRON, RETICCTPCT in the last 72 hours. Urinalysis    Component Value Date/Time   COLORURINE AMBER (A) 11/21/2019 1630   APPEARANCEUR CLOUDY (A) 11/21/2019 1630   LABSPEC 1.036 (H) 11/21/2019 1630   PHURINE 5.0 11/21/2019 1630   GLUCOSEU NEGATIVE 11/21/2019 1630   HGBUR SMALL (A) 11/21/2019 1630   BILIRUBINUR NEGATIVE 11/21/2019 1630   BILIRUBINUR n 01/03/2016 1048   KETONESUR 20 (A) 11/21/2019 1630   PROTEINUR 100 (A) 11/21/2019 1630   UROBILINOGEN 2.0 (H) 08/24/2018 1936   NITRITE NEGATIVE 11/21/2019 1630   LEUKOCYTESUR NEGATIVE 11/21/2019 1630     Microbiology No results found for this or any previous visit (from the past 240 hour(s)).   Inpatient Medications:   Scheduled Meds: . acetylcysteine  4 mL Nebulization Once  .  amantadine  25 mg Per Tube Daily  . aspirin  81 mg Per Tube QHS  . chlorhexidine  15 mL Mouth Rinse BID  . enoxaparin (LOVENOX) injection  40 mg Subcutaneous Daily  . feeding supplement (PROSource TF)  45 mL Per Tube Daily  . free water  200 mL Per Tube Q8H  . losartan  25 mg Per Tube Daily  . mouth rinse  15 mL Mouth Rinse q12n4p  . senna-docusate  1 tablet Per Tube BID  . valproic acid  250 mg Per Tube Q8H  . vitamin B-12  100 mcg Per Tube Daily   Continuous Infusions: . feeding supplement (OSMOLITE 1.2 CAL) 1,000 mL (12/06/19 1305)     Radiological Exams on Admission: No results found.  Impression/Recommendations Acute hypoxemic respiratory failure/sepsis due to multifocal aspiration pneumonia and patient with significant dysphagia.  Notable ongoing aspiration with gurgling sounds.  -Completed antibiotic course per primary.  However, she remains high risk for aspiration complications. -Consider glycopyrrolate for secretion.  Severe dysphagia/severe protein calorie malnutrition-failed swallowing eval and MBS twice.  Currently on TF via Cortrak.  IR could not place G-tube due to difficult anatomy.  Regardless, tube feed is not going to reduce her risk of aspiration in patients with severe dementia.  I recommended against surgical placement of G-tube.  -On tube feed via core track  Dementia without behavioral disturbance-appears severe.  Dependent for most ADLs at baseline.  History of seizure disorder: On Depakote. -Continue Depakote per tube  Chronic diastolic CHF: Appears euvolemic.  Essential hypertension: Normotensive -Continue losartan  Debility: Wheelchair-bound at baseline.  Goal of care: Patient with severe dementia, debility, severe dysphagia, severe protein calorie malnutrition and other comorbidities as above.  Very guarded prognosis.  She is a still full code with full scope of care per family's wish although I do not think this is to the patient's best interest.   I believe interventions such as G-tube placement, CPR or intubation pose more harm than benefit given patient's frailty and comorbidity.  I recommended against these procedures.  Patient's daughter says, she would  discuss with her father (patient's husband) before making any changes.  I encouraged her to reach out to Dr. Heber Guayanilla after her discussion with her father.   Thank you for this consultation.  TRH will sign off but available if needed.   55 minutes with more than 50% spent in reviewing records, counseling patient/family and coordinating care.  Mercy Riding M.D. [facility] 12/06/2019, 4:45 PM

## 2019-12-06 NOTE — Progress Notes (Signed)
  Speech Language Pathology Treatment: Dysphagia  Patient Details Name: Nicole Blackwell MRN: [id number] DOB: March 27, 1935 Today's Date: 12/06/2019 Time: 6222-9798 SLP Time Calculation (min) (ACUTE ONLY): 13 min  Assessment / Plan / Recommendation Clinical Impression  Pt was seen for dysphagia treatment. She was less alert at baseline but was able to rouse with verbal and tactile stimulation. Pt's vocal quality remains wet and congested, suggesting impaired secretion management. Per EMR, pt had an episode of desaturation this morning which was improved with repositioning and suctioning. Vocal quality was improved slightly with prompted coughing, but pt exhibited difficulty following commands. She demonstrated adequate bolus manipulation and acceptance of limited trials. However, vocal quality became more wet and prolonged  coughing was noted following trials, suggesting continued aspiration. Pt still does not present as a candidate for safe p.o. intake. SLP will continue to follow pt; however, considering the severity of her impairments, the impact of her underlying lordosis on airway protection, that she has not been able to tolerate a p.o. diet since 9/27, and that no significant progress has been noted since then, her prognosis for swallowing remediation is still judged to be fair to poor. Attempts were made to contact pt's daughter, Nicole Blackwell, via phone to update her on the pt's swallow function. However, the phone was not answered and voicemail was not set up.    HPI HPI: Nicole Blackwell is a 84 y.o. female with medical history significant for dementia with no behavioral disturbances, essential hypertension, chronic diastolic CHF, seizure disorder on Depakote, oropharyngeal dysphagia, who presented from SNF due to increasing shortness of breath. MBS 9/25: mild oropharyngeal dysphagia c/b trace, transient laryngeal penetration of thin liquid x1 on examination. EEG 9/29: moderate diffuse encephalopathy,  nonspecific etiology but could be secondary to toxic-metabolic causes. Per Dr. Rebeca Alert' note on 9/30, pt's daughter, "Nicole Blackwell says Ms. Reidinger would definitely want life saving measures, even if the possibility of success were low." Pt with swallow decline and made NPO 9/27 after ST recommendation.  MBS 10/5 repeated to assess current function and NPO recommended.         SLP Plan  Continue with current plan of care       Recommendations  Diet recommendations: NPO Medication Administration: Via alternative means                Oral Care Recommendations: Oral care QID Follow up Recommendations: Skilled Nursing facility SLP Visit Diagnosis: Dysphagia, oropharyngeal phase (R13.12) Plan: Continue with current plan of care       Claressa Hughley I. Hardin Negus, [place], [place] Office number [phone] Pager [place] 12/06/2019, 1:13 PM

## 2019-12-06 NOTE — Progress Notes (Signed)
Nutrition Follow-up  RD working remotely.  DOCUMENTATION CODES:   Severe malnutrition in context of chronic illness  INTERVENTION:   - RD will continue to follow for [place] discussions; per notes, pt is not a candidate for PEG or open G-tube placement  Continue tube feeds via Cortrak: - Increase Osmolite 1.2 to 60 ml/hr (1440 ml/day) - Continue ProSource 45 ml daily - Continue free water per MD, currently 200 ml q 8 hours  Tube feeding regimenprovides 1768kcal, 91grams of protein, and 1129m of H2O.  Total free water with flushes: 1781 ml  NUTRITION DIAGNOSIS:   Severe Malnutrition related to chronic illness (dementia, dysphagia, CHF) as evidenced by moderate fat depletion, severe muscle depletion, percent weight loss (20.8% weight loss in less than 1 year).  Ongoing, being addressed via TF  GOAL:   Patient will meet greater than or equal to 90% of their needs  Met via TF  MONITOR:   Diet advancement, Labs, Weight trends, TF tolerance, Skin, I & O's  REASON FOR ASSESSMENT:   Consult Enteral/tube feeding initiation and management  ASSESSMENT:   84year old female who presented to the ED from SNF on 9/24 with worsening SOB. PMH of HTN, dementia, CHF, seizure disorder, oropharyngeal dysphagia. Pt admitted with sepsis secondary to multifocal HCAP with suspicion for aspiration.  09/25 - MBS with recommendation for dysphagia 2 diet with thin liquids 09/29 - NPO 10/01 - gastric Cortrak placed 10/05 - MBS showing worsening swallow function  RD unable to reach pt via phone call. Noted pt with increased difficulty handling secretions. Per review of notes, PEG tube placement by IR cannot be done due to pt's anatomy. Pt would require open G-tube placement for which she is not a candidate per notes. Pt cannot d/c to SNF with Cortrak in place and per SLP recommendations pt remains NPO. Per SLP, "prognosis for initiation of a PO diet is still judged to be fair-poor at this  time." Palliative Care Teams continues to try to engage pt's daughter in GAlohadiscussions. Per notes, pt would qualify for residential hospice at this time.  Admit weight: 58.9 kg Current weight: 54.5 kg  RD to adjust TF regimen to prevent additional weight loss.  Current TF regimen: Osmolite 1.2 @ 50 ml/hr ProSource TF 45 ml daily, free water 200 ml q 8 hours  Medications reviewed and include: senna, vitamin B-12  Labs reviewed. CBG's: 129-142 x 24 hours  UOP: 800 ml x 24 hours  Diet Order:   Diet Order            Diet NPO time specified  Diet effective now                 EDUCATION NEEDS:   Not appropriate for education at this time  Skin:  Skin Assessment: Reviewed RN Assessment  Last BM:  11/28/19  Height:   Ht Readings from Last 1 Encounters:  11/30/19 _0  (1.651 m)    Weight:   Wt Readings from Last 1 Encounters:  12/06/19 54.5 kg    Ideal Body Weight:  56.8 kg  BMI:  Body mass index is 19.99 kg/m.  Estimated Nutritional Needs:   Kcal:  1550-1750  Protein:  70-85 grams  Fluid:  >/= 1.5 L    KGaynell Face MS, RD, LDN Inpatient Clinical Dietitian Please see AMiON for contact information.

## 2019-12-06 NOTE — Progress Notes (Signed)
Palliative: Chart review completed.  Conference with attending and interdisciplinary team related to goals of care.  Telephone conference with [facility] social worker, Nicole Blackwell related to family dynamics, and [facility] goals of care discussions.  [facility] has recommended hospice care.   Call to daughter, Nicole Blackwell.  Nicole Blackwell tells me that she feels like her mother is doing well.  She states, "I do not see a deterioration.  She is still doing what she does at home".  She goes further to share that when she visits her mother she asks for some soda, her mother still wants to eat.  We talked about the desire to eat versus her ability to safely swallow due to aspiration risk.  Nicole Blackwell continues to state that nursing staff, even last night, states that she is, "way better" and, "there has been a dramatic change".  Nicole Blackwell shares her concern about "hearing different things" from different hospital staff.    Nicole Blackwell shares that she does still hear "congestion", but feels like her mother is improving.  I shared that the medical team see something different.  We talked about a limited time trial for tube feeding, Nicole Blackwell has had tube feeding for about 2 weeks, and she is not showing meaningful improvements.  Nicole Blackwell continues to return to her mother's "willpower", and her belief that her mother will improve enough to be able to eat safely, she just needs more time.  I share with Nicole Blackwell that the multidisciplinary team will discuss the risks and benefits of tube feeding, but this is, again, not a permanent solution.  We talked about the safest possible diet which would likely be pured, but, we expect that Nicole Blackwell will continue to aspirate.  We talked about residential hospice.  Nicole Blackwell continues to be resistant to residential hospice care, but she does ask about at home hospice services.     We talked about CODE STATUS, when her heart naturally stops, allowing natural death.  Nicole Blackwell states that her mother is to be resuscitated.  We  talked about "attempted resuscitation".  Nicole Blackwell continues to talk about her brothers perceived improvements because she is "talking more".  It is clear that she is unable/unwilling to choose anything other than full scope/full code for Nicole Blackwell.  Detail conference with attending, bedside nursing staff, transition of care team, pace social worker related to patient condition, needs, goals of care.   Recommendations Two provider DNR is recommended as we first do no harm.   Not a candidate for IR or open G-tube placement. Cortrak placed 11/24/2019.   Limited time trial for discontinuation of core track/temporary tube feeding. Recommend multidisciplinary consensus related to stop date for core track/temporary tube feeding. Usually no more than 30 days.   Per RD; Admit weight: 58.9 kg, Current weight: 54.5 kg, marked weight loss despite nutritional supplementation. ST continues to recommend NPO and alter forms for meds. Ethics consult recommended.   Plan:   At this point daughter desires full scope/full code.  See above recommendations.  Nicole Blackwell would clearly qualify for residential hospice with an albumin of 1.9, however daughter is not in agreement. She would likely qualify for long-term care, I see no ability to rehab. She would qualify for at home hospice, and do not rehospitalize.  65 minutes, extended time Nicole Axe, NP Palliative medicine team Team phone [phone] Greater than 50% of this time was spent counseling and coordinating care related to the above assessment and plan.

## 2019-12-06 NOTE — Progress Notes (Signed)
Discussed patient's care with Nicole Blackwell, daughter wishes to continue full scope of treatment.  She continues to struggle with oral secretions and is at very high risk for aspiration. She has advanced dementia, chronic dysphagia and severe malnutrition.  Her dysphasia has not improved after treatment of her acute illness.  Will plan to discuss tomorrow with SLP and nutrition about an end date for our trial of tube feeds.  I also know that Nicole Blackwell's daughter does want "resusitation" given her frailty, advanced illness of dementia, CHF, malnutrition I do not believe cardiac resuscitation would be effective and I do believe it would be harmful.  I will consult [facility] to see if they can consult on the case to review and see if they agree with this assessment.  Will also obtain ethics consultation.

## 2019-12-06 NOTE — Progress Notes (Signed)
HD#19 Subjective:   No acute events overnight.   Evaluated at bedside during rounds. Groaning on exam this morning however denies being in any pain or discomfort. Nursing at bedside, shares that patient had a brief desaturation event this morning however was suctioned and repositioned with good effect.   Objective:   Vital signs in last 24 hours: Vitals:   12/05/19 2200 12/05/19 2300 12/06/19 0000 12/06/19 0334  BP: 131/64 (!) 115/55 133/82 130/63  Pulse: 82 75 90   Resp: 17 16 17    Temp:  98.1 F (36.7 C)  98.6 F (37 C)  TempSrc:  Oral  Axillary  SpO2: 94% 96% 94%   Weight:    54.5 kg  Height:       Physical Exam:  Gen: Lying in bed, appears comfortable, awake and alert, groaning Neuro: Alert and oriented to self HENT: Cortrak in place Resp: Voice is wet/gurgling, coughcontinues to be wet, transmitted upper airway sounds Abd: soft, non-distended, non-tender Ext: warm, no edema  Pertinent Labs: No new labs this morning CBC Latest Ref Rng & Units 12/03/2019 11/28/2019 11/24/2019  WBC 4.0 - 10.5 K/uL 7.1 9.9 9.2  Hemoglobin 12.0 - 15.0 g/dL 12.7 12.3 12.9  Hematocrit 36 - 46 % 40.5 39.1 40.9  Platelets 150 - 400 K/uL 543(H) 382 301    CMP Latest Ref Rng & Units 12/03/2019 12/02/2019 12/01/2019  Glucose 70 - 99 mg/dL 140(H) 119(H) 142(H)  BUN 8 - 23 mg/dL 11 14 8   Creatinine 0.44 - 1.00 mg/dL 0.41(L) 0.48 0.51  Sodium 135 - 145 mmol/L 137 140 139  Potassium 3.5 - 5.1 mmol/L 4.2 4.2 4.4  Chloride 98 - 111 mmol/L 99 101 103  CO2 22 - 32 mmol/L 28 29 29   Calcium 8.9 - 10.3 mg/dL 8.8(L) 9.0 8.9  Total Protein 6.5 - 8.1 g/dL - - -  Total Bilirubin 0.3 - 1.2 mg/dL - - -  Alkaline Phos 38 - 126 U/L - - -  AST 15 - 41 U/L - - -  ALT 0 - 44 U/L - - -    Assessment/Plan:   Principal Problem:   HCAP (healthcare-associated pneumonia) Active Problems:   Chronic diastolic CHF (congestive heart failure) (HCC)   Vascular dementia without behavioral disturbance (HCC)    Dementia (HCC)   Protein-calorie malnutrition, severe (HCC)   Severe sepsis (HCC)   Goals of care, counseling/discussion   Palliative care by specialist   DNR (do not resuscitate) discussion   Encounter for hospice care discussion   Patient Summary:  Ms. Wish is a 84 year old woman with past medical history significant for dementia, HTN, diastolic heart failure, seizure disorder (on Depakote) oropharyngeal dysphagia, who presented from skilled nursing facility with worsening dyspnea and imaging suggestive of multifocal pneumonia, R>L likely secondary to aspiration.   This is hospital day 33.  Dysphagia:  S/p MBS on 9/25 and 10/5 which showed worsening swallow function. Mental status at baseline. SLP recommend continued NPO, note that patient's prognosis for initiation of PO diet is fair-poor. As below, daughter would like to pursue G tube placement for continued nutrition. IR Unable to place tube, and palliative care discussed results with daughter and that the PEG tube placement via surgical route would be of no benefit.  - SLP following, appreciate recs  - prognosis for initiation of PO diet is fair-poor  - Continue NPO - Cortrak for trickle tube feedings placed 10/1 - Palliative and PACE working en tandem for continued [place] discussion  Goals of Care Disposition Prognosis for initiation of PO diet is fair-poor. Patient's PACE PCP Linda Hedges) and social worker [name]) are aware and supportive of palliative care efforts to engage patient's family in goals of care conversations.  - Palliative Care following, appreciate their involvement and guidance - Patient would qualify for residential hospice with albumin of 1.9 and would likely qualify for long-term care and could benefit from home hospice care.  - Patient's family continue to be reluctant for anything other than full scope/full code.  - Recommend two provider DNR - PT: patient is not a candidate  for continued rehab; will need long-term care vs hospice  Acute hypoxic resp failure and Sepsis 2/2 multifocal PNA R>L Pneumonia seems resolved, cough is still weak, remains an aspiration risk as above.  Moderate Diffuse Encephalopathy with Tremor, resolved History of Seizure Disorder  History of Dementia Patient's mental status improved and stable over the last week. She is spontaneously awake, following commands.Tolerating trickle tube feeds. Per our and palliative care's discussions with patient's family, family would like to pursue G tube placement. BP controlled and stable on losartan 25 mg. Continuing amantadine per neurology.  - Valproate 250mg  q8h per tube - Amantadine 25 mg daily to help with mental status, rigidity, per tube - Losartan 25 mg daily  FEN/GI - Tube feeds via Cortrak, nutrition following - Osmolite to goal rate of 50 ml/hr (1200 ml/day) - ProSource 45 ml daily - Free water flushes - NPO due to aspiration risk  Diet: Tube feeds IVF: None VTE: Enoxaparin Code: Full PT/OT recs: Long term care vs hospice  Please contact the on call pager after 5 pm and on weekends at [phone].  Alexandria Lodge, MD IMTS, PGY-1 Pager: [phone] 12/06/2019,7:09 AM

## 2019-12-07 MED ORDER — GUAIFENESIN 100 MG/5ML PO SOLN
10.0000 mL | Freq: Four times a day (QID) | ORAL | Status: DC | PRN
  Administered 2019-12-07: 200 mg via ORAL
  Filled 2019-12-07: qty 10

## 2019-12-07 NOTE — Progress Notes (Signed)
Noted with less congestion, tube feeding restarted at 60 ml/hr. Continue to monitor.

## 2019-12-07 NOTE — Progress Notes (Signed)
Very congested with frequent productive cough. Attempted to suction oropharyngeal since cannot spit out phlegm but refused to open mouth. Would also bite suction cath.  PLaced  tube feeding on hold. Continue to monitor.

## 2019-12-07 NOTE — Plan of Care (Signed)

## 2019-12-07 NOTE — Progress Notes (Signed)
HD#20 Subjective:   No acute events overnight.   Evaluated at bedside during rounds. Patient resting in bed, appears comfortable, not responding to commands when asking to open her eyes or give "thumbs up." Patient with more gurgling sounds today compared to yesterday.  Objective:   Vital signs in last 24 hours: Vitals:   12/07/19 0100 12/07/19 0200 12/07/19 0300 12/07/19 0400  BP: 138/67 (!) 149/72 (!) 147/71 (!) 147/71  Pulse: 81 98 96 96  Resp: (!) 22 (!) 22 19 20   Temp:    98.7 F (37.1 C)  TempSrc:    Oral  SpO2: 92% 92% 96% 98%  Weight:      Height:       Physical Exam:  Gen: Lying in bed, appears comfortable, eyes closed Cardiac: tachycardic (100s), regular rhythm, no m/r/g Neuro: Moves spontaneously with light sternal rub HENT: Cortrak in place Resp: Voice is wet/gurgling, coughcontinues to be wet, transmitted upper airway sounds Abd: soft, non-distended Ext: warm, no edema  Pertinent Labs: No new labs this morning CBC Latest Ref Rng & Units 12/03/2019 11/28/2019 11/24/2019  WBC 4.0 - 10.5 K/uL 7.1 9.9 9.2  Hemoglobin 12.0 - 15.0 g/dL 12.7 12.3 12.9  Hematocrit 36 - 46 % 40.5 39.1 40.9  Platelets 150 - 400 K/uL 543(H) 382 301    CMP Latest Ref Rng & Units 12/03/2019 12/02/2019 12/01/2019  Glucose 70 - 99 mg/dL 140(H) 119(H) 142(H)  BUN 8 - 23 mg/dL 11 14 8   Creatinine 0.44 - 1.00 mg/dL 0.41(L) 0.48 0.51  Sodium 135 - 145 mmol/L 137 140 139  Potassium 3.5 - 5.1 mmol/L 4.2 4.2 4.4  Chloride 98 - 111 mmol/L 99 101 103  CO2 22 - 32 mmol/L 28 29 29   Calcium 8.9 - 10.3 mg/dL 8.8(L) 9.0 8.9  Total Protein 6.5 - 8.1 g/dL - - -  Total Bilirubin 0.3 - 1.2 mg/dL - - -  Alkaline Phos 38 - 126 U/L - - -  AST 15 - 41 U/L - - -  ALT 0 - 44 U/L - - -    Assessment/Plan:   Principal Problem:   HCAP (healthcare-associated pneumonia) Active Problems:   Chronic diastolic CHF (congestive heart failure) (HCC)   Vascular dementia without behavioral disturbance  (HCC)   Dementia (HCC)   Protein-calorie malnutrition, severe (HCC)   Severe sepsis (HCC)   Goals of care, counseling/discussion   Palliative care by specialist   DNR (do not resuscitate) discussion   Encounter for hospice care discussion   Patient Summary:  [name] is a 84 year old woman with past medical history significant for dementia, HTN, diastolic heart failure, seizure disorder (on Depakote) oropharyngeal dysphagia, who presented from skilled nursing facility with worsening dyspnea and imaging suggestive of multifocal pneumonia, R>L likely secondary to aspiration.   This is hospital day 20.  Dysphagia:  S/p MBS on 9/25 and 10/5 which showed worsening swallow function. Patient not following commands this morning. SLP recommend continued NPO, note that patient has not tolerated a PO diet since 9/27 and has had minimal to no progress in her swallowing function since then. As below, daughter would like to pursue G tube placement for continued nutrition. IR Unable to place tube, and palliative care discussed results with daughter and that the PEG tube placement via surgical route would be of no benefit.  - SLP following, appreciate recs  - prognosis for initiation of PO diet is fair-poor  - Continue NPO - Cortrak for trickle tube feedings  placed 10/1 (14 days) - Palliative and PACE working en tandem for continued [place] discussion  Goals of Care Disposition Prognosis for initiation of PO diet is fair-poor. Patient's PACE PCP Linda Hedges) and social worker [name]) are aware and supportive of palliative care efforts to engage patient's family in goals of care conversations.  - Palliative Care following, appreciate their involvement and guidance - Patient would qualify for residential hospice with albumin of 1.9 and would likely qualify for long-term care and could benefit from home hospice care.  - Two-provider DNR as of 12/07/19 - PT: patient is not a  candidate for continued rehab; will need long-term care vs hospice  Severe Malnutrition related to chronic illness - NPO due to aspiration risk - Tube feeds via Cortrak, nutrition following - Osmolite to goal rate of 50 ml/hr (1200 ml/day) - ProSource 45 ml daily - Free water flushes  Acute hypoxic resp failure and Sepsis 2/2 multifocal PNA R>L Pneumonia seems resolved, cough is still weak, remains an aspiration risk as above.  Moderate Diffuse Encephalopathy with Tremor, resolved History of Seizure Disorder  History of Dementia Patient's mental status improved and stable over the last week. This morning she was not following commands. Tolerating trickle tube feeds. Per our and palliative care's discussions with patient's family, family would like to pursue G tube placement. BP controlled and stable on losartan 25 mg. Continuing amantadine per neurology.  - Valproate 250mg  q8h per tube - Amantadine 25 mg daily to help with mental status, rigidity, per tube - Losartan 25 mg daily   Diet: Tube feeds IVF: None VTE: Enoxaparin Code: Full PT/OT recs: Long term care vs hospice  Please contact the on call pager after 5 pm and on weekends at [phone].  Alexandria Lodge, MD IMTS, PGY-1 Pager: [phone] 12/07/2019,5:46 AM

## 2019-12-08 MED ORDER — GLYCOPYRROLATE 0.2 MG/ML IJ SOLN
0.2000 mg | Freq: Three times a day (TID) | INTRAMUSCULAR | Status: DC | PRN
  Administered 2019-12-08: 0.2 mg via INTRAVENOUS
  Filled 2019-12-08: qty 1

## 2019-12-08 MED ORDER — GLYCOPYRROLATE 1 MG PO TABS
1.0000 mg | ORAL_TABLET | Freq: Two times a day (BID) | ORAL | Status: DC
  Administered 2019-12-08: 1 mg
  Filled 2019-12-08 (×2): qty 1

## 2019-12-08 MED ORDER — VALPROATE SODIUM 500 MG/5ML IV SOLN
200.0000 mg | Freq: Four times a day (QID) | INTRAVENOUS | Status: DC
  Administered 2019-12-08 – 2019-12-18 (×39): 200 mg via INTRAVENOUS
  Filled 2019-12-08 (×43): qty 2

## 2019-12-08 NOTE — Progress Notes (Signed)
  Speech Language Pathology Treatment: Dysphagia  Patient Details Name: Nicole Blackwell MRN: [id number] DOB: 1935/09/19 Today's Date: 12/08/2019 Time: 1000-1010 SLP Time Calculation (min) (ACUTE ONLY): 10 min  Assessment / Plan / Recommendation Clinical Impression  Pt was seen for dysphagia treatment. Her level of alertness was sub-optimal and she did not follow commands or communicate verbally. She continues to present with a wet vocal quality, suggesting continued difficulty with secretion management. During this session pt was unable to demonstrate coughing or throat clearing despite verbal prompts. Pt did not demonstrate a volitional swallow despite verbal prompts and tactile cues. P.o. trials were therefore deferred due to pt's significant aspiration risk. Overall, pt has demonstrated a further decline in her swallow function. Considering her progressive decline, the impact lordosis on her swallow, and her performance today, her prognosis for remediation of dysphagia is now judged to be poor-guarded.   Pt's daughter, Nicole Blackwell, was contacted via phone and educated regarding the pt's performance today. She was also advised that the pt's swallow function has been progressively declining since she was initially seen on 11/18/19. She indicated that she would like to have the G-tube placed surgically and that she was told two days prior that this would be the plan. She was advised that SLP does not recommend this in pts such as her mother who have dementia. She verbalized understanding but stated that she "will not give up" on her mom and wants everything done to keep her alive. SLP will follow less closely; however, discontinuation of SLP services is likely imminent if she continues to be unable to participate in therapeutic intervention.    HPI HPI: Nicole Blackwell is a 84 y.o. female with medical history significant for dementia with no behavioral disturbances, essential hypertension, chronic diastolic CHF,  seizure disorder on Depakote, oropharyngeal dysphagia, who presented from SNF due to increasing shortness of breath. MBS 9/25: mild oropharyngeal dysphagia c/b trace, transient laryngeal penetration of thin liquid x1 on examination. EEG 9/29: moderate diffuse encephalopathy, nonspecific etiology but could be secondary to toxic-metabolic causes. Per Dr. Rebeca Alert' note on 9/30, pt's daughter, "Nicole Blackwell says Ms. Preuss would definitely want life saving measures, even if the possibility of success were low." Pt with swallow decline and made NPO 9/27 after ST recommendation.  MBS 10/5 repeated to assess current function and NPO recommended.  Two provided DNR was conducted on 10/14.       SLP Plan  Continue with current plan of care       Recommendations  Diet recommendations: NPO Medication Administration: Via alternative means                Oral Care Recommendations: Oral care QID Follow up Recommendations: Skilled Nursing facility SLP Visit Diagnosis: Dysphagia, oropharyngeal phase (R13.12) Plan: Continue with current plan of care       Avantika Shere I. Hardin Negus, [place], [place] Office number [phone] Pager [place] 12/08/2019, 10:56 AM

## 2019-12-08 NOTE — Progress Notes (Signed)
Very congested, with frequent oral  and oropharyngeal suctioning with mod. thick white secretions. Tube feeding placed on hold. Continue to monitor.

## 2019-12-08 NOTE — Progress Notes (Signed)
HD#21 Subjective:   No acute events overnight.   Evaluated at bedside during rounds. RN at bedside suctioning secretions as we arrived. Patient lying in bed, appears comfortable, eyes closed. At first does not follow commands to open eyes and just groans, cough is wet/gurgly, however after suctioning she opens her eyes and is oriented to self. Denies pain.   Later in the morning notified by RN about increase of oral secretions and holding of tube feeds with order for Nicole Blackwell.   Objective:   Vital signs in last 24 hours: Vitals:   12/07/19 2316 12/08/19 0330 12/08/19 0345 12/08/19 0713  BP: 124/74 126/80  (!) 141/67  Pulse: (!) 104 (!) 101  97  Resp: (!) 24  20 20   Temp: 98.4 F (36.9 C) 98.9 F (37.2 C)  98.6 F (37 C)  TempSrc: Oral Oral  Axillary  SpO2: 97% 96%  95%  Weight:  54.4 kg    Height:       Physical Exam:  Gen: Lying in bed, appears comfortable, eyes closed Cardiac: tachycardic (100s), regular rhythm, no m/r/g Neuro: At first does not follow commands to open eyes and just groans, cough is wet/gurgly, however after suctioning she opens her eyes and is oriented to self HENT: Cortrak in place Resp: Voice is wet/gurgling, worse than yesterday, coughcontinues to be wet, transmitted upper airway sounds Abd: soft, non-distended Ext: warm, no edema  Pertinent Labs: No new labs this morning CBC Latest Ref Rng & Units 12/03/2019 11/28/2019 11/24/2019  WBC 4.0 - 10.5 K/uL 7.1 9.9 9.2  Hemoglobin 12.0 - 15.0 g/dL 12.7 12.3 12.9  Hematocrit 36 - 46 % 40.5 39.1 40.9  Platelets 150 - 400 K/uL 543(H) 382 301    CMP Latest Ref Rng & Units 12/03/2019 12/02/2019 12/01/2019  Glucose 70 - 99 mg/dL 140(H) 119(H) 142(H)  BUN 8 - 23 mg/dL 11 14 8   Creatinine 0.44 - 1.00 mg/dL 0.41(L) 0.48 0.51  Sodium 135 - 145 mmol/L 137 140 139  Potassium 3.5 - 5.1 mmol/L 4.2 4.2 4.4  Chloride 98 - 111 mmol/L 99 101 103  CO2 22 - 32 mmol/L 28 29 29   Calcium 8.9 - 10.3 mg/dL 8.8(L) 9.0 8.9   Total Protein 6.5 - 8.1 g/dL - - -  Total Bilirubin 0.3 - 1.2 mg/dL - - -  Alkaline Phos 38 - 126 U/L - - -  AST 15 - 41 U/L - - -  ALT 0 - 44 U/L - - -    Assessment/Plan:   Principal Problem:   HCAP (healthcare-associated pneumonia) Active Problems:   Chronic diastolic CHF (congestive heart failure) (HCC)   Vascular dementia without behavioral disturbance (HCC)   Dementia (HCC)   Protein-calorie malnutrition, severe (HCC)   Severe sepsis (HCC)   Goals of care, counseling/discussion   Palliative care by specialist   DNR (do not resuscitate) discussion   Encounter for hospice care discussion   Patient Summary:  Nicole Blackwell is a 84 year old woman with past medical history significant for dementia, HTN, diastolic heart failure, seizure disorder (on Depakote) oropharyngeal dysphagia, who presented from skilled nursing facility with worsening dyspnea and imaging suggestive of multifocal pneumonia, R>L likely secondary to aspiration.   This is hospital day 21.  Dysphagia:  S/p MBS on 9/25 and 10/5 which showed worsening swallow function. Patient not following commands this morning. SLP recommend continued NPO, note that patient has not tolerated a PO diet since 9/27 and has had minimal to no progress in  her swallowing function since then. As below, daughter has expressed wanting to pursue G tube placement for continued nutrition. IR Unable to place tube, and palliative care discussed results with daughter and that the PEG tube placement via surgical route would be of no benefit.  - SLP following, appreciate recs  - Per SLP, prognosis for initiation of PO diet is poor-guarded  - Continue NPO - Cortrak for trickle tube feedings placed 10/1 (14 days). Patient has intermittently required pauses in her tube feeds secondary to difficulty with secretions.  - Start glycopyrrolate 1 mg per tube twice daily - Palliative and PACE working en tandem for continued [place] discussion  Goals of  Care Disposition Prognosis for initiation of PO diet is fair-poor. Patient's PACE PCP Nicole Blackwell) and social worker Nicole Blackwell) are aware and supportive of palliative care efforts to engage patient's family in goals of care conversations.  - Palliative Care following, appreciate their involvement and guidance - Patient would qualify for residential hospice with albumin of 1.9 and would likely qualify for long-term care and could benefit from home hospice care.  - Two-provider DNR as of 12/07/19. Appreciate Dr. Juliann Blackwell consultation. - In conversation with patient's daughter today, [name]. Conversations with daughter are on-going. - PT: patient is not a candidate for continued rehab; will need long-term care vs hospice  Severe Malnutrition related to chronic illness - NPO due to aspiration risk - Tube feeds via Cortrak, nutrition following - Osmolite to goal rate of 50 ml/hr (1200 ml/day) - ProSource 45 ml daily - Free water flushes  Acute hypoxic resp failure and Sepsis 2/2 multifocal PNA R>L Pneumonia seems resolved, cough is still weak, remains an aspiration risk as above.  Moderate Diffuse Encephalopathy with Tremor, resolved History of Seizure Disorder  History of Dementia Patient's mental status improved and stable over the last week. This morning she was not following commands. Tolerating trickle tube feeds. Per our and palliative care's discussions with patient's family, family would like to pursue G tube placement. BP controlled and stable on losartan 25 mg. Continuing amantadine per neurology.  - Valproate 250mg  q8h per tube - Amantadine 25 mg daily to help with mental status, rigidity, per tube - Losartan 25 mg daily   Diet: Tube feeds IVF: None VTE: Enoxaparin Code: Full PT/OT recs: Long term  care vs hospice  Please contact the on call pager after 5 pm and on weekends at [phone].  Nicole Lodge, MD IMTS, PGY-1 Pager: [phone] 12/08/2019,7:19 AM

## 2019-12-08 NOTE — Progress Notes (Signed)
Md made aware about the increase   of Oral secretions  and  holding of tube feeding with order for robinul. Continue to monitor.

## 2019-12-08 NOTE — Progress Notes (Signed)
Less congested,  Tube feeding restarted.

## 2019-12-09 LAB — CBC
HCT: 43.7 % (ref 36.0–46.0)
Hemoglobin: 14 g/dL (ref 12.0–15.0)
MCH: 26.2 pg (ref 26.0–34.0)
MCHC: 32 g/dL (ref 30.0–36.0)
MCV: 81.7 fL (ref 80.0–100.0)
Platelets: 361 10*3/uL (ref 150–400)
RBC: 5.35 MIL/uL — ABNORMAL HIGH (ref 3.87–5.11)
RDW: 15.3 % (ref 11.5–15.5)
WBC: 9.6 10*3/uL (ref 4.0–10.5)
nRBC: 0 % (ref 0.0–0.2)

## 2019-12-09 MED ORDER — ACETAMINOPHEN 80 MG RE SUPP
80.0000 mg | Freq: Four times a day (QID) | RECTAL | Status: DC | PRN
  Administered 2019-12-09: 80 mg via RECTAL
  Filled 2019-12-09 (×2): qty 1

## 2019-12-09 NOTE — Progress Notes (Signed)
HD#22 Subjective:   O/N Events: None  [name] was seen and evaluated at bedside this AM.  Patient would moan to questions this morning, grimaced on physical stimulus, but would not open her eyes to command.   Objective:   Vital signs in last 24 hours: Vitals:   12/08/19 2328 12/08/19 2330 12/09/19 0337 12/09/19 0353  BP: 131/70  139/71   Pulse: (!) 109  (!) 113 (!) 106  Resp: (!) 24 20 (!) 22 20  Temp: 99.6 F (37.6 C)  100 F (37.8 C)   TempSrc: Axillary  Axillary   SpO2: 99%  95% 90%  Weight:      Height:       Physical Exam:  Physical Exam Constitutional:      General: She is not in acute distress.    Appearance: She is ill-appearing. She is not diaphoretic.  HENT:     Head: Normocephalic and atraumatic.  Eyes:     Pupils: Pupils are equal, round, and reactive to light.  Cardiovascular:     Rate and Rhythm: Normal rate and regular rhythm.     Pulses: Normal pulses.     Heart sounds: Normal heart sounds. No murmur heard.  No friction rub. No gallop.   Pulmonary:     Effort: Pulmonary effort is normal.     Breath sounds: No wheezing, rhonchi or rales.     Comments: Lungs CTAB, upper respiratory sounds appreciated.  Abdominal:     General: Abdomen is flat. Bowel sounds are normal.     Palpations: Abdomen is soft.     Tenderness: There is no abdominal tenderness. There is no guarding.     Pertinent Labs: No new labs this morning CBC Latest Ref Rng & Units 12/03/2019 11/28/2019 11/24/2019  WBC 4.0 - 10.5 K/uL 7.1 9.9 9.2  Hemoglobin 12.0 - 15.0 g/dL 12.7 12.3 12.9  Hematocrit 36 - 46 % 40.5 39.1 40.9  Platelets 150 - 400 K/uL 543(H) 382 301    CMP Latest Ref Rng & Units 12/03/2019 12/02/2019 12/01/2019  Glucose 70 - 99 mg/dL 140(H) 119(H) 142(H)  BUN 8 - 23 mg/dL 11 14 8   Creatinine 0.44 - 1.00 mg/dL 0.41(L) 0.48 0.51  Sodium 135 - 145 mmol/L 137 140 139  Potassium 3.5 - 5.1 mmol/L 4.2 4.2 4.4  Chloride 98 - 111 mmol/L 99 101 103  CO2 22 - 32 mmol/L 28  29 29   Calcium 8.9 - 10.3 mg/dL 8.8(L) 9.0 8.9  Total Protein 6.5 - 8.1 g/dL - - -  Total Bilirubin 0.3 - 1.2 mg/dL - - -  Alkaline Phos 38 - 126 U/L - - -  AST 15 - 41 U/L - - -  ALT 0 - 44 U/L - - -    Assessment/Plan:   Principal Problem:   HCAP (healthcare-associated pneumonia) Active Problems:   Chronic diastolic CHF (congestive heart failure) (HCC)   Vascular dementia without behavioral disturbance (HCC)   Dementia (HCC)   Protein-calorie malnutrition, severe (HCC)   Severe sepsis (HCC)   Goals of care, counseling/discussion   Palliative care by specialist   DNR (do not resuscitate) discussion   Encounter for hospice care discussion   Patient Summary:  [name] is a 84 year old woman with past medical history significant for dementia, HTN, diastolic heart failure, seizure disorder (on Depakote) oropharyngeal dysphagia, who presented from skilled nursing facility with worsening dyspnea and imaging suggestive of multifocal pneumonia, R>L likely secondary to aspiration.   Dysphagia:  S/p MBS on 9/25 and 10/5 which showed worsening swallow function. Patient not following commands this morning. SLP recommend continued NPO, note that patient has not tolerated a PO diet since 9/27 and has had minimal to no progress in her swallowing function since then. As below, daughter has expressed wanting to pursue G tube placement for continued nutrition, but it has been discussed that [name] is not a candidate for G tube placement IR or surgical placement given her anatomy.  - SLP following, appreciate recs  - Per SLP, prognosis for initiation of PO diet is poor-guarded  - Continue NPO - Discontinued Cortrak 12/08/19  - Glycopyrrolate 0.2 Q8H PRN - Palliative and PACE working en tandem for continued [place] discussion  Goals of Care Disposition Prognosis for initiation of PO diet is fair-poor. Patient's PACE PCP Linda Hedges) and social worker [name]) are aware and  supportive of palliative care efforts to engage patient's family in goals of care conversations.  - Palliative Care following, appreciate their involvement and guidance - Patient would qualify for residential hospice with albumin of 1.9 and would likely qualify for long-term care and could benefit from home hospice care.  - Two-provider DNR as of 12/07/19. - PT: patient is not a candidate for continued rehab; will need long-term care vs hospice  Severe Malnutrition related to chronic illness - NPO due to aspiration risk - Tube feeds via Cortrak, nutrition following - Osmolite to goal rate of 50 ml/hr (1200 ml/day) - ProSource 45 ml daily - Free water flushes  Acute hypoxic resp failure and Sepsis 2/2 multifocal PNA R>L Pneumonia seems resolved, cough is still weak, remains an aspiration risk as above.  Moderate Diffuse Encephalopathy with Tremor, resolved History of Seizure Disorder  History of Dementia Patient's mental status improved and stable over the last week. This morning she was not following commands. Tolerating trickle tube feeds. Per our and palliative care's discussions with patient's family, family would like to pursue G tube placement. BP controlled and stable on losartan 25 mg. Continuing amantadine per neurology.  - Valproate 200 mg Q6H IV - Amantadine 25 mg daily to help with mental status, rigidity, per tube - Losartan 25 mg daily   Diet: Tube feeds IVF: None VTE: Enoxaparin Code: Full PT/OT recs: Long term care vs hospice  Please contact the on call pager after 5 pm and on weekends at [phone].  Maudie Mercury, MD IMTS, PGY-2 Pager: [phone] 12/09/2019,11:35 AM

## 2019-12-09 NOTE — Plan of Care (Signed)

## 2019-12-10 DIAGNOSIS — R509 Fever, unspecified: Secondary | ICD-10-CM

## 2019-12-10 NOTE — Progress Notes (Addendum)
  Speech Language Pathology Treatment: Dysphagia  Patient Details Name: Nicole Blackwell MRN: [id number] DOB: Oct 31, 1935 Today's Date: 12/10/2019 Time: 2683-4196 SLP Time Calculation (min) (ACUTE ONLY): 20 min  Assessment / Plan / Recommendation Clinical Impression  Pt was seen for dysphagia treatment. Enteral nutrition via Cortrak has been discontinued and per Dr. Rebeca Alert' note, the plan is to discuss allowing eating for comfort with palliative care and daughter on 10/18. Pt was more alert during this session. She opened her eyes and responded to some of the SLP's questions with improved vocal quality compared to 10/15. Pt demonstrated reduced awareness of the initial bolus of puree and bolus manipulation was absent with this bolus. She accepted subsequent honey thick liquid boluses via tsp and cup with progressive improvement in bolus awareness, labial seal, and labial stripping. She exhibited coughing with 2 of 5 boluses of honey thick liquids via tsp and throat clearing was noted with 1 of 4 boluses of honey thick liquids via cup, both suggesting aspiration. Coughing and secondary swallows were consistently observed with puree boluses, suggesting possible aspiration of pharyngeal residue.   Pt has demonstrated some improvement in swallow function compared to 10/15. Discussions regarding goals of care are ongoing with the pt's daughter, Nicole Blackwell, and the medical team. If pt is able to maintain this level of performance and it is still in alignment with family's desires, SLP would recommend repeat modified barium swallow study to reassess swallow function. However, should family wish to initiate a p.o. diet for comfort, with known aspiration risk, a dysphagia 1 diet with honey thick liquids would likely provide the most comfort due to the reduced frequency and severity of coughing with these consistencies. However, should family decide on comfort care, other consistencies would also be allowed.    HPI HPI:  Nicole Blackwell is a 84 y.o. female with medical history significant for dementia with no behavioral disturbances, essential hypertension, chronic diastolic CHF, seizure disorder on Depakote, oropharyngeal dysphagia, who presented from SNF due to increasing shortness of breath. MBS 9/25: mild oropharyngeal dysphagia c/b trace, transient laryngeal penetration of thin liquid x1 on examination. EEG 9/29: moderate diffuse encephalopathy, nonspecific etiology but could be secondary to toxic-metabolic causes. Per Dr. Rebeca Alert' note on 9/30, pt's daughter, "Nicole Blackwell says Nicole Blackwell would definitely want life saving measures, even if the possibility of success were low." Pt with swallow decline and made NPO 9/27 after ST recommendation.  MBS 10/5 repeated to assess current function and NPO recommended.  Two provider DNR was conducted on 10/14 and Cotrak removed 10/15.       SLP Plan  MBS (If pt able to participate and aligns with goals of care)       Recommendations  Diet recommendations: NPO (vs p.o. diet of dysphagia 1/honey thick liquids for comfort pending goals of care) Liquids provided via: Cup;No straw;Teaspoon Medication Administration: Via alternative means                Oral Care Recommendations: Oral care QID Follow up Recommendations: Skilled Nursing facility SLP Visit Diagnosis: Dysphagia, oropharyngeal phase (R13.12) Plan: MBS (If pt able to participate )       Nasteho Glantz I. Hardin Negus, [place], [place] Office number [phone] Pager [phone]                Horton Marshall 12/10/2019, 5:20 PM

## 2019-12-10 NOTE — Progress Notes (Signed)
  Date: 12/10/2019  Patient name: Nicole Blackwell  Medical record number: [id number]  Date of birth: 09-26-1935   I have seen and evaluated this patient and I have discussed the plan of care with the house staff. Please see their note for complete details. I concur with their findings with the following additions/corrections:   Fever overnight to 101. Continues to have very limited response, able to get a word out, but did not open eyes or otherwise interact. Very prominent stertor in upper airway, she did not open her mouth to allow suctioning. She has not been able to eat since removing coretrack on Friday, SLP recommending NPO for safety. Fever likely related to aspiration event, will hold off on antibiotics unless persistent fever or hypoxia.   Will discuss with palliative care and daughter tomorrow allowing eating for comfort, but she has not appeared awake enough to even try to eat or drink. Unfortunately, I suspect she will continue to decline.  Lenice Pressman, M.D., Ph.D. 12/10/2019, 2:26 PM

## 2019-12-10 NOTE — Progress Notes (Signed)
HD#23 Subjective:   O/N Events: 66 F  Nicole Blackwell was seen and evaluated at bedside this AM.  She was intermittently responsive today, able to stated that she's "good." No attempts made at answering orientation questions.   Objective:   Vital signs in last 24 hours: Vitals:   12/09/19 1900 12/09/19 1930 12/09/19 2300 12/10/19 0400  BP: 131/69  121/61   Pulse: (!) 102 100 96   Resp: 20 20 (!) 22 20  Temp: 98.8 F (37.1 C)  98.6 F (37 C) 98.4 F (36.9 C)  TempSrc: Oral  Oral Oral  SpO2: 93% 94% 97%   Weight:    54 kg  Height:       Physical Exam:  Physical Exam Constitutional:      Comments: Chronically ill appearing.   Cardiovascular:     Rate and Rhythm: Regular rhythm. Tachycardia present.     Pulses: Normal pulses.     Heart sounds: No murmur heard.  No friction rub. No gallop.   Pulmonary:     Effort: Pulmonary effort is normal.     Comments: Upper airway sounds appreciated.  Abdominal:     General: Abdomen is flat. Bowel sounds are normal.     Palpations: Abdomen is soft.    Pertinent Labs: No new labs this morning CBC Latest Ref Rng & Units 12/09/2019 12/03/2019 11/28/2019  WBC 4.0 - 10.5 K/uL 9.6 7.1 9.9  Hemoglobin 12.0 - 15.0 g/dL 14.0 12.7 12.3  Hematocrit 36 - 46 % 43.7 40.5 39.1  Platelets 150 - 400 K/uL 361 543(H) 382    CMP Latest Ref Rng & Units 12/03/2019 12/02/2019 12/01/2019  Glucose 70 - 99 mg/dL 140(H) 119(H) 142(H)  BUN 8 - 23 mg/dL 11 14 8   Creatinine 0.44 - 1.00 mg/dL 0.41(L) 0.48 0.51  Sodium 135 - 145 mmol/L 137 140 139  Potassium 3.5 - 5.1 mmol/L 4.2 4.2 4.4  Chloride 98 - 111 mmol/L 99 101 103  CO2 22 - 32 mmol/L 28 29 29   Calcium 8.9 - 10.3 mg/dL 8.8(L) 9.0 8.9  Total Protein 6.5 - 8.1 g/dL - - -  Total Bilirubin 0.3 - 1.2 mg/dL - - -  Alkaline Phos 38 - 126 U/L - - -  AST 15 - 41 U/L - - -  ALT 0 - 44 U/L - - -    Assessment/Plan:   Principal Problem:   HCAP (healthcare-associated pneumonia) Active Problems:   Chronic  diastolic CHF (congestive heart failure) (HCC)   Vascular dementia without behavioral disturbance (HCC)   Dementia (HCC)   Protein-calorie malnutrition, severe (HCC)   Severe sepsis (HCC)   Goals of care, counseling/discussion   Palliative care by specialist   DNR (do not resuscitate) discussion   Encounter for hospice care discussion   Patient Summary:  Nicole Blackwell is a 84 year old woman with past medical history significant for dementia, HTN, diastolic heart failure, seizure disorder (on Depakote) oropharyngeal dysphagia, who presented from skilled nursing facility with worsening dyspnea and imaging suggestive of multifocal pneumonia, R>L likely secondary to aspiration.   Dysphagia:  S/p MBS on 9/25 and 10/5 which showed worsening swallow function. Patient not following commands this morning. SLP recommend continued NPO, note that patient has not tolerated a PO diet since 9/27 and has had minimal to no progress in her swallowing function since then. As below, daughter has expressed wanting to pursue G tube placement for continued nutrition, but it has been discussed that Nicole Blackwell is not  a candidate for G tube placement IR or surgical placement given her anatomy.  - Continue NPO - Glycopyrrolate 0.2 Q8H PRN - Palliative and PACE working en tandem for continued [place] discussion  Goals of Care Disposition Prognosis for initiation of PO diet is fair-poor. Patient's PACE PCP Nicole Blackwell) and social worker Nicole Blackwell) are aware and supportive of palliative care efforts to engage patient's family in goals of care conversations.  - Palliative Care following, appreciate their involvement and guidance - Patient would qualify for residential hospice with albumin of 1.9 and would likely qualify for long-term care and could benefit from home hospice care.  - Two-provider DNR as of 12/07/19. - PT: patient is not a candidate for continued rehab; will need long-term care vs  hospice  Severe Malnutrition related to chronic illness - NPO due to aspiration risk - Tube feeds via Cortrak, nutrition following - Osmolite to goal rate of 50 ml/hr (1200 ml/day) - ProSource 45 ml daily - Free water flushes  Acute hypoxic resp failure and Sepsis 2/2 multifocal PNA R>L Pneumonia seems resolved, cough is still weak, remains an aspiration risk as above.  Moderate Diffuse Encephalopathy with Tremor, resolved History of Seizure Disorder  History of Dementia Patient's mental status improved and stable over the last week. This morning she was not following commands. Tolerating trickle tube feeds. Per our and palliative care's discussions with patient's family, family would like to pursue G tube placement. BP controlled and stable on losartan 25 mg. Continuing amantadine per neurology.  - Valproate 200 mg Q6H IV - Amantadine 25 mg daily to help with mental status, rigidity, per tube - Losartan 25 mg daily  Fever:  One time fever of 101F yesterday late afternoon. CBC shows no new leukocytosis. If patient fevers again, will FU with BCs.   Diet: Tube feeds IVF: None VTE: Enoxaparin Code: Full PT/OT recs: Long term care vs hospice  Please contact the on call pager after 5 pm and on weekends at [phone].  Maudie Mercury, MD IMTS, PGY-2 Pager: [phone] 12/10/2019,5:56 AM

## 2019-12-11 MED ORDER — SENNOSIDES-DOCUSATE SODIUM 8.6-50 MG PO TABS
1.0000 | ORAL_TABLET | Freq: Two times a day (BID) | ORAL | Status: DC
  Administered 2019-12-12 – 2019-12-19 (×15): 1 via ORAL
  Filled 2019-12-11 (×15): qty 1

## 2019-12-11 MED ORDER — GERHARDT'S BUTT CREAM
TOPICAL_CREAM | Freq: Two times a day (BID) | CUTANEOUS | Status: DC
  Administered 2019-12-14 – 2019-12-17 (×3): 1 via TOPICAL
  Filled 2019-12-11: qty 1

## 2019-12-11 MED ORDER — VITAMIN B-12 100 MCG PO TABS
100.0000 ug | ORAL_TABLET | Freq: Every day | ORAL | Status: DC
  Administered 2019-12-12 – 2019-12-19 (×8): 100 ug via ORAL
  Filled 2019-12-11 (×8): qty 1

## 2019-12-11 MED ORDER — LACTATED RINGERS IV SOLN: INTRAVENOUS | Status: DC

## 2019-12-11 MED ORDER — ASPIRIN 81 MG PO CHEW
81.0000 mg | CHEWABLE_TABLET | Freq: Every day | ORAL | Status: DC
  Administered 2019-12-12 – 2019-12-18 (×7): 81 mg via ORAL
  Filled 2019-12-11 (×7): qty 1

## 2019-12-11 MED ORDER — AMANTADINE HCL 50 MG/5ML PO SYRP
25.0000 mg | ORAL_SOLUTION | Freq: Every day | ORAL | Status: DC
  Administered 2019-12-12 – 2019-12-19 (×8): 25 mg via ORAL
  Filled 2019-12-11 (×8): qty 2.5

## 2019-12-11 MED ORDER — ACETAMINOPHEN 500 MG PO TABS
500.0000 mg | ORAL_TABLET | Freq: Four times a day (QID) | ORAL | Status: DC | PRN
  Administered 2019-12-19: 500 mg via ORAL
  Filled 2019-12-11: qty 1

## 2019-12-11 NOTE — Plan of Care (Signed)

## 2019-12-11 NOTE — TOC Progression Note (Signed)
Transition of Care [facility]) - Progression Note    Patient Details  Name: Nicole Blackwell MRN: [id number] Date of Birth: 1935-07-27  [facility] CM/SW Contact  Zenon Mayo, RN Phone Number: 12/11/2019, 2:25 PM  Clinical Narrative:    NCM received a call from [facility] with [facility], she states that  If they decide on Hospice facility, then there are two that [facility] works with that is [place] in [place].        Expected Discharge Plan and Services                                                 Social Determinants of Health (SDOH) Interventions    Readmission Risk Interventions No flowsheet data found.

## 2019-12-11 NOTE — Progress Notes (Signed)
HD#24 Subjective:   No acute events overnight.   Evaluated at bedside during rounds. Patient sitting up right in bed, eyes closed, nasal cannula in place. Opens eyes to voice, states she is doing "good." Does not respond when asked for her name. When asked if she is in pain, states, "No."   Objective:   Vital signs in last 24 hours: Vitals:   12/10/19 1950 12/10/19 2000 12/10/19 2316 12/11/19 0300  BP:  106/61  (!) 120/59  Pulse:  80  72  Resp: 15 15 14 15   Temp:  98.8 F (37.1 C) 98 F (36.7 C) 98 F (36.7 C)  TempSrc:  Oral Oral Oral  SpO2:  100%  100%  Weight:      Height:       Physical Exam:  Gen: Lying in bed, appears comfortable, eyes closed at first but opens eyes to voice Cardiac: regular rate and rhythm, no m/r/g Neuro: Opens eyes to voice, does not respond when asked for her name or other orientation questions Resp: Nasal cannula in place on 2 L [place], voice is raspy, less wet than previously, transmitted upper airway sounds on auscultation Abd: soft, non-distended Ext: warm, no edema  Pertinent Labs: No new labs this morning CBC Latest Ref Rng & Units 12/09/2019 12/03/2019 11/28/2019  WBC 4.0 - 10.5 K/uL 9.6 7.1 9.9  Hemoglobin 12.0 - 15.0 g/dL 14.0 12.7 12.3  Hematocrit 36 - 46 % 43.7 40.5 39.1  Platelets 150 - 400 K/uL 361 543(H) 382    CMP Latest Ref Rng & Units 12/03/2019 12/02/2019 12/01/2019  Glucose 70 - 99 mg/dL 140(H) 119(H) 142(H)  BUN 8 - 23 mg/dL 11 14 8   Creatinine 0.44 - 1.00 mg/dL 0.41(L) 0.48 0.51  Sodium 135 - 145 mmol/L 137 140 139  Potassium 3.5 - 5.1 mmol/L 4.2 4.2 4.4  Chloride 98 - 111 mmol/L 99 101 103  CO2 22 - 32 mmol/L 28 29 29   Calcium 8.9 - 10.3 mg/dL 8.8(L) 9.0 8.9  Total Protein 6.5 - 8.1 g/dL - - -  Total Bilirubin 0.3 - 1.2 mg/dL - - -  Alkaline Phos 38 - 126 U/L - - -  AST 15 - 41 U/L - - -  ALT 0 - 44 U/L - - -    Assessment/Plan:   Principal Problem:   HCAP (healthcare-associated pneumonia) Active Problems:    Chronic diastolic CHF (congestive heart failure) (HCC)   Vascular dementia without behavioral disturbance (HCC)   Dementia (HCC)   Protein-calorie malnutrition, severe (HCC)   Severe sepsis (HCC)   Goals of care, counseling/discussion   Palliative care by specialist   DNR (do not resuscitate) discussion   Encounter for hospice care discussion   Patient Summary:  Nicole Blackwell is a 84 year old woman with past medical history significant for dementia, HTN, diastolic heart failure, seizure disorder (on Depakote) oropharyngeal dysphagia, who presented from skilled nursing facility with worsening dyspnea and imaging suggestive of multifocal pneumonia, R>L likely secondary to aspiration.   This is hospital day 24.  Dysphagia:  S/p MBS on 9/25 and 10/5 which showed worsening swallow function. Patient arouses to voice however not following commands. Secretions much improved with glycopyrrolate. Cortrak removed 10/15. Per SLP evaluation yesterday (10/17), patient has demonstrated some improvement in swallow function since 10/15 and note that repeat MBS would be recommended should the patient maintain her swallowing performance and it is in alignment with family's desires. Will continue with comfort feeds of Dysphagia 1/honey thick liquids  pending further SLP eval.  - SLP following - Continue comfort feeds: dysphagia 1/honey thick liquids - Glycopyrrolate 0.2 mg IV Q8H PRN - Palliative and PACE working en tandem for continued [place] discussion  Goals of Care Disposition Cortrak removed 10/15. Comfort feedings as above, though patient has had minimal PO intake since Cortrak removal. Patient's PACE PCP Nicole Blackwell) and social worker Nicole Blackwell) are aware and supportive of palliative care efforts to engage patient's family in goals of care conversations.  - Palliative Care following, appreciate their involvement and guidance - Patient would qualify for residential hospice with albumin of  1.9 and would likely qualify for long-term care and could benefit from home hospice care.  - Two-provider DNR as of 12/07/19. - PT: patient is not a candidate for continued rehab; will need long-term care vs hospice  Severe Malnutrition related to chronic illness - s/p Cortrak 10/1-10/15 - Comfort feeds as above with dysphagia 1/honey thick liquids  Acute hypoxic resp failure and Sepsis 2/2 multifocal PNA R>L Pneumonia seems resolved, cough is still weak, remains an aspiration risk as above.  Moderate Diffuse Encephalopathy with Tremor, resolved History of Seizure Disorder  History of Dementia Patient's mental status improved and stable over the last week. This morning she was not following commands. Tolerating trickle tube feeds. Per our and palliative care's discussions with patient's family, family would like to pursue G tube placement. BP controlled and stable on losartan 25 mg. Continuing amantadine per neurology.  - Valproate 200 mg Q6H IV - Amantadine 25 mg daily to help with mental status, rigidity. Note that patient has not taken since Cortrak removed 10/15. - Will hold losartan 25 mg daily as her pressures have been at goal for 3 days without  Fever:  One time fever of 101F 12/09/19 late afternoon. CBC shows no new leukocytosis. If patient fevers again, will FU with BCs.   Diet: dysphagia 1/honey thick liquids IVF: LR @ 50 cc/hr VTE: Enoxaparin Code: DNR PT/OT recs: Long term care vs hospice  Please contact the on call pager after 5 pm and on weekends at [phone].  [place] Lodge, MD IMTS, PGY-1 Pager: [phone] 12/11/2019,5:48 AM

## 2019-12-11 NOTE — Plan of Care (Signed)

## 2019-12-11 NOTE — Progress Notes (Signed)
spoon fed during lunch , tolerated about 10%  Of the food.

## 2019-12-11 NOTE — Progress Notes (Signed)
Telemetry monitoring discontinued as ordered

## 2019-12-11 NOTE — Progress Notes (Signed)
  Speech Language Pathology Treatment: Dysphagia  Patient Details Name: Nicole Blackwell MRN: [id number] DOB: 1935-11-06 Today's Date: 12/11/2019 Time: 0900-0920 SLP Time Calculation (min) (ACUTE ONLY): 20 min  Assessment / Plan / Recommendation Clinical Impression  Pt was seen for dysphagia treatment. She was lethargic and demonstrated decreased alertness, requiring max multimodal cueing in order to attend to POs. Given max cueing and heavy effort from therapist, she accepted about 1 oz of nectar thick liquid via teaspoon. She exhibited poor awareness of POs, anterior spillage, oral holding, prolonged oral transit, suspected delayed swallow initiation, and minimal hyolaryngeal excursion was palpated. No coughing was observed, but a wet vocal quality was noted following POs, revealing concern for possible aspiration. Discussions regarding goals of care are ongoing with the pt's family and the medical team. Given pt's lethargy, decreased awareness, and worsening swallow function, repeat MBS is not warranted at this time. Should the family wish to initiate a PO diet for comfort with known aspiration risk, a dys 1 diet with nectar thick liquids would be appropriate or diet could be liberalized if other textures are desired.    HPI HPI: Nicole Blackwell is a 84 y.o. female with medical history significant for dementia with no behavioral disturbances, essential hypertension, chronic diastolic CHF, seizure disorder on Depakote, oropharyngeal dysphagia, who presented from SNF due to increasing shortness of breath. MBS 9/25: mild oropharyngeal dysphagia c/b trace, transient laryngeal penetration of thin liquid x1 on examination. EEG 9/29: moderate diffuse encephalopathy, nonspecific etiology but could be secondary to toxic-metabolic causes. Per Dr. Rebeca Alert' note on 9/30, pt's daughter, "Nicole Blackwell says [name] would definitely want life saving measures, even if the possibility of success were low." Pt with swallow decline  and made NPO 9/27 after ST recommendation.  MBS 10/5 repeated to assess current function and NPO recommended.  Two provider DNR was conducted on 10/14 and Cotrak removed 10/15.       SLP Plan  Continue with current plan of care       Recommendations  Diet recommendations: NPO (vs p.o. diet of dysphagia 1/nectarthick liquids for comfort ) Liquids provided via: Teaspoon Medication Administration: Via alternative means Supervision: Full supervision/cueing for compensatory strategies;Staff to assist with self feeding Compensations: Slow rate;Small sips/bites;Monitor for anterior loss                Oral Care Recommendations: Oral care QID SLP Visit Diagnosis: Dysphagia, oropharyngeal phase (R13.12) Plan: Continue with current plan of care       GO                [name] 12/11/2019, 10:34 AM

## 2019-12-12 NOTE — Progress Notes (Signed)
Nutrition Follow-up  RD working remotely.  DOCUMENTATION CODES:   Severe malnutrition in context of chronic illness  INTERVENTION:   - Pt's last bowel movement was 11/28/19 per flowsheet documentation. Recommend adjusting bowel regimen.  - RD will continue to monitor for [place] discussions and decisions regarding disposition.  - Magic Cup TID with meals, each supplement provides 290 kcal and 9 grams of protein  NUTRITION DIAGNOSIS:   Severe Malnutrition related to chronic illness (dementia, dysphagia, CHF) as evidenced by moderate fat depletion, severe muscle depletion, percent weight loss (20.8% weight loss in less than 1 year).  Ongoing  GOAL:   Patient will meet greater than or equal to 90% of their needs  Unmet at this time  MONITOR:   Diet advancement, Labs, Weight trends, TF tolerance, Skin, I & O's  REASON FOR ASSESSMENT:   Consult Enteral/tube feeding initiation and management  ASSESSMENT:   84 year old female who presented to the ED from SNF on 9/24 with worsening SOB. PMH of HTN, dementia, CHF, seizure disorder, oropharyngeal dysphagia. Pt admitted with sepsis secondary to multifocal HCAP with suspicion for aspiration.  09/25 - MBS with recommendation for dysphagia 2 diet with thin liquids 09/29 - NPO 10/01 - gastric Cortrak placed 10/05 - MBS showing worsening swallow function 10/15 - Cortrak removed 10/18 - diet advanced to dysphagia 1 with honey-thick liquids for comfort (SLP still recommending NPO)  Cortrak removed and diet advanced to safest option for comfort. Pt being spoon-fed by nursing but only tolerating small amounts (10-20% of meals). Per nursing notes, pt at times hold food in her mouth and requires oral suctioning.  RD will order appropriately thickened oral nutrition supplements to aid in meeting kcal and protein needs.  Per notes, pt is not a candidate for PEG by IR due to anatomy or a surgical G-tube placement. Family considering hospice  options.  Admit weight: 58.9 kg Current weight: 53.1 kg  Meal Completion: 10-20%  Medications reviewed and include: senna, vitamin B-12, IV depacon IVF: LR @ 50 ml/hr  Labs reviewed.   Diet Order:   Diet Order            DIET - DYS 1 Room service appropriate? Yes; Fluid consistency: Honey Thick  Diet effective now                 EDUCATION NEEDS:   Not appropriate for education at this time  Skin:  Skin Assessment: Skin Integrity Issues: Other: skin tear  Last BM:  11/28/19  Height:   Ht Readings from Last 1 Encounters:  11/30/19 5\' 5"  (1.651 m)    Weight:   Wt Readings from Last 1 Encounters:  12/12/19 53.1 kg    Ideal Body Weight:  56.8 kg  BMI:  Body mass index is 19.48 kg/m.  Estimated Nutritional Needs:   Kcal:  1550-1750  Protein:  70-85 grams  Fluid:  >/= 1.5 L    Gaynell Face, MS, RD, LDN Inpatient Clinical Dietitian Please see [facility] for contact information.

## 2019-12-12 NOTE — Progress Notes (Signed)
HD#25 Subjective:   No acute events overnight.   Evaluated at bedside during rounds. Patient resting in bed, spontaneously awake, gaze forward. Able to state her name, denies any pain, or being hungry at this time. Unable to state where she is. Voice is wet/gurgly.  Objective:   Vital signs in last 24 hours: Vitals:   12/11/19 2005 12/11/19 2351 12/12/19 0300 12/12/19 0420  BP: (!) 111/46 101/60  125/62  Pulse:  71  75  Resp: 16 16  18   Temp: 98.1 F (36.7 C) 97.9 F (36.6 C)  98.2 F (36.8 C)  TempSrc: Temporal Axillary  Axillary  SpO2: 99% 100%  100%  Weight:   53.1 kg   Height:       Physical Exam:  Gen: Lying in bed, appears comfortable, eyes open spontaneously Cardiac: regular rate and rhythm, no m/r/g Neuro: Eyes open spontaneously, able to state name however does not answer other orientation questions Resp: On room air, voice is wet, transmitted upper airway sounds on auscultation Abd: soft, non-distended Ext: warm, no edema  Pertinent Labs: No new labs this morning CBC Latest Ref Rng & Units 12/09/2019 12/03/2019 11/28/2019  WBC 4.0 - 10.5 K/uL 9.6 7.1 9.9  Hemoglobin 12.0 - 15.0 g/dL 14.0 12.7 12.3  Hematocrit 36 - 46 % 43.7 40.5 39.1  Platelets 150 - 400 K/uL 361 543(H) 382    CMP Latest Ref Rng & Units 12/03/2019 12/02/2019 12/01/2019  Glucose 70 - 99 mg/dL 140(H) 119(H) 142(H)  BUN 8 - 23 mg/dL 11 14 8   Creatinine 0.44 - 1.00 mg/dL 0.41(L) 0.48 0.51  Sodium 135 - 145 mmol/L 137 140 139  Potassium 3.5 - 5.1 mmol/L 4.2 4.2 4.4  Chloride 98 - 111 mmol/L 99 101 103  CO2 22 - 32 mmol/L 28 29 29   Calcium 8.9 - 10.3 mg/dL 8.8(L) 9.0 8.9  Total Protein 6.5 - 8.1 g/dL - - -  Total Bilirubin 0.3 - 1.2 mg/dL - - -  Alkaline Phos 38 - 126 U/L - - -  AST 15 - 41 U/L - - -  ALT 0 - 44 U/L - - -    Assessment/Plan:   Principal Problem:   HCAP (healthcare-associated pneumonia) Active Problems:   Chronic diastolic CHF (congestive heart failure) (HCC)    Vascular dementia without behavioral disturbance (HCC)   Dementia (HCC)   Protein-calorie malnutrition, severe (HCC)   Severe sepsis (HCC)   Goals of care, counseling/discussion   Palliative care by specialist   DNR (do not resuscitate) discussion   Encounter for hospice care discussion   Patient Summary:  Nicole Blackwell is a 84 year old woman with past medical history significant for dementia, HTN, diastolic heart failure, seizure disorder (on Depakote) oropharyngeal dysphagia, who presented from skilled nursing facility with worsening dyspnea and imaging suggestive of multifocal pneumonia, R>L likely secondary to aspiration.   This is hospital day 25.  Dysphagia:  S/p MBS on 9/25 and 10/5 which showed worsening swallow function. Patient arouses to voice however not following commands. Secretions much improved with glycopyrrolate. Cortrak removed 10/15. Per SLP, patient continues to demonstrate poor awareness of PO intake, notes that repeat MBS not warranted at this time. Will continue with comfort feeds of Dysphagia 1/honey thick liquids, though patient's intake since Cortrak removal has been minimal. - SLP following - Continue comfort feeds: dysphagia 1/honey thick liquids - Glycopyrrolate 0.2 mg IV Q8H PRN - Palliative and PACE working en tandem for continued GOC discussion  Goals of Care  Disposition Comfort feedings as above, though patient has had minimal PO intake since Cortrak removal. Patient's PACE PCP [facility]) and social worker [name]) are aware and supportive of palliative care efforts to engage patient's family in goals of care conversations.  - Palliative Care following, appreciate their involvement and guidance - Two-provider DNR as of 12/07/19 - Due to patient's poor nutrition status, albumin <2, and inability to tolerate substantive comfort feeds, suspect her life expectancy is less than 2 weeks - Patient's family supportive of residential hospice. Awaiting  placement. - PT: patient is not a candidate for continued rehab; will need long-term care vs hospice  Severe Malnutrition related to chronic illness - s/p Cortrak 10/1-10/15 - Comfort feeds as above with dysphagia 1/honey thick liquids  Acute hypoxic resp failure and Sepsis 2/2 multifocal PNA R>L Pneumonia seems resolved, cough is still weak, remains an aspiration risk as above.  Moderate Diffuse Encephalopathy with Tremor, resolved History of Seizure Disorder  History of Dementia Patient's mental status improved and stable over the last week. BP controlled. - Valproate 200 mg Q6H IV - Amantadine 25 mg daily to help with mental status, rigidity. Note that patient has not taken since Cortrak removed 10/15. - Will hold losartan 25 mg daily as her pressures have been at goal for 3 days without  Fever:  One time fever of 101F 12/09/19 late afternoon. CBC shows no new leukocytosis. If patient fevers again, will FU with BCs.   Diet: dysphagia 1/honey thick liquids IVF: LR @ 50 cc/hr VTE: Enoxaparin Code: DNR PT/OT recs: Long term care vs hospice  Please contact the on call pager after 5 pm and on weekends at [phone].  Alexandria Lodge, MD IMTS, PGY-1 Pager: [phone] 12/12/2019,6:16 AM

## 2019-12-12 NOTE — TOC Progression Note (Signed)
Transition of Care [facility]) - Progression Note    Patient Details  Name: Nicole Blackwell MRN: [id number] Date of Birth: [birth year]  [facility] of [facility]) CM/SW Contact  Zenon Mayo, RN Phone Number: 12/12/2019, 12:47 PM  Clinical Narrative:    Per Heber [place] MD, family has chosen to do residential hospice and wants [employer].  NCM informed CSW and Visual merchandiser.           Expected Discharge Plan and Services                                                 Social Determinants of Health (SDOH) Interventions    Readmission Risk Interventions No flowsheet data found.

## 2019-12-12 NOTE — Progress Notes (Signed)
Took a few bites with pureed diet and started not opening mouth. Would hold food in her mouth. Oral suctioning done.

## 2019-12-12 NOTE — Progress Notes (Signed)
Spoon fed , would hold food in her mouth at times, oral suction done. Tolerated thicken fluid in mod amount. Head of bed on upright position while feeding.

## 2019-12-12 NOTE — Plan of Care (Signed)

## 2019-12-12 NOTE — Progress Notes (Signed)
  Speech Language Pathology Treatment: Dysphagia  Patient Details Name: Nicole Blackwell MRN: [id number] DOB: 01-Sep-1935 Today's Date: 12/12/2019 Time: 8453-6468 SLP Time Calculation (min) (ACUTE ONLY): 20 min  Assessment / Plan / Recommendation Clinical Impression  Pt was seen for dysphagia treatment. She continues to be lethargic, however, her alertness had somewhat improved since yesterday's session. Pt accepted about 2 oz of honey thick liquid with moderate multimodal cueing. Cueing was utilized to encourage pt to accept PO and initiate swallow. She accepted one PO of puree, but was unable to initiate a swallow despite max cueing and heavy effort from the therapist. She continues to demonstrate poor awareness of POs, oral holding, prolonged oral transit, and suspected delayed swallow initiation. Immediate coughing and throat clearing was also observed with puree. Recommend dys 1 diet and honey-thick liquid. Staff helping with feeding should avoid giving pt thick purees. Provide pt with thinner purees like pudding and applesauce. Discussed best head and neck posture with RN. SLP will continue to f/u.   HPI HPI: Nicole Blackwell is a 83 y.o. female with medical history significant for dementia with no behavioral disturbances, essential hypertension, chronic diastolic CHF, seizure disorder on Depakote, oropharyngeal dysphagia, who presented from SNF due to increasing shortness of breath. MBS 9/25: mild oropharyngeal dysphagia c/b trace, transient laryngeal penetration of thin liquid x1 on examination. EEG 9/29: moderate diffuse encephalopathy, nonspecific etiology but could be secondary to toxic-metabolic causes. Per Dr. Rebeca Alert' note on 9/30, pt's daughter, "[name] says Ms. Bahr would definitely want life saving measures, even if the possibility of success were low." Pt with swallow decline and made NPO 9/27 after ST recommendation.  MBS 10/5 repeated to assess current function and NPO recommended.  Two provider  DNR was conducted on 10/14 and Cotrak removed 10/15.       SLP Plan  Continue with current plan of care       Recommendations  Diet recommendations: Honey-thick liquid;Dysphagia 1 (puree) Liquids provided via: Teaspoon Medication Administration: Via alternative means Supervision: Full supervision/cueing for compensatory strategies;Staff to assist with self feeding Compensations: Minimize environmental distractions;Monitor for anterior loss;Slow rate;Small sips/bites Postural Changes and/or Swallow Maneuvers: Seated upright 90 degrees                Oral Care Recommendations: Oral care QID Follow up Recommendations: Skilled Nursing facility SLP Visit Diagnosis: Dysphagia, oropharyngeal phase (R13.12) Plan: Continue with current plan of care       GO                [name] 12/12/2019, 9:11 AM

## 2019-12-13 NOTE — Plan of Care (Signed)

## 2019-12-13 NOTE — Progress Notes (Signed)
HD#26 Subjective:   No acute events overnight.   Evaluated at bedside during rounds. Patient resting with eyes closed, not opening eyes or responding to verbal commands at this time. Nurse present and states patient told her "no" today.  Objective:   Vital signs in last 24 hours: Vitals:   12/12/19 1943 12/12/19 2311 12/13/19 0405 12/13/19 0432  BP: 125/63 123/70 (!) 119/59   Pulse: 82 74 71   Resp: 15 16 17    Temp: 98.1 F (36.7 C) 97.6 F (36.4 C) 98.3 F (36.8 C)   TempSrc: Oral Axillary Oral   SpO2: 99% 100% 99%   Weight:    54.4 kg  Height:       Physical Exam:  Gen: Lying in bed, appears comfortable, eyes closed Cardiac: regular rate and rhythm, no m/r/g Neuro: eyes closed, patient mumbles when asked to state her name, does not follow command to open eyes Resp: Normal work of breathing on room air, transmitted upper airway sounds on auscultation Abd: soft, non-distended Ext: warm, no edema  Pertinent Labs: No new labs this morning CBC Latest Ref Rng & Units 12/09/2019 12/03/2019 11/28/2019  WBC 4.0 - 10.5 K/uL 9.6 7.1 9.9  Hemoglobin 12.0 - 15.0 g/dL 14.0 12.7 12.3  Hematocrit 36 - 46 % 43.7 40.5 39.1  Platelets 150 - 400 K/uL 361 543(H) 382    CMP Latest Ref Rng & Units 12/03/2019 12/02/2019 12/01/2019  Glucose 70 - 99 mg/dL 140(H) 119(H) 142(H)  BUN 8 - 23 mg/dL 11 14 8   Creatinine 0.44 - 1.00 mg/dL 0.41(L) 0.48 0.51  Sodium 135 - 145 mmol/L 137 140 139  Potassium 3.5 - 5.1 mmol/L 4.2 4.2 4.4  Chloride 98 - 111 mmol/L 99 101 103  CO2 22 - 32 mmol/L 28 29 29   Calcium 8.9 - 10.3 mg/dL 8.8(L) 9.0 8.9  Total Protein 6.5 - 8.1 g/dL - - -  Total Bilirubin 0.3 - 1.2 mg/dL - - -  Alkaline Phos 38 - 126 U/L - - -  AST 15 - 41 U/L - - -  ALT 0 - 44 U/L - - -    Assessment/Plan:   Principal Problem:   HCAP (healthcare-associated pneumonia) Active Problems:   Chronic diastolic CHF (congestive heart failure) (HCC)   Vascular dementia without behavioral  disturbance (HCC)   Dementia (HCC)   Protein-calorie malnutrition, severe (HCC)   Severe sepsis (HCC)   Goals of care, counseling/discussion   Palliative care by specialist   DNR (do not resuscitate) discussion   Encounter for hospice care discussion   Patient Summary:  [name] is a 84 year old woman with past medical history significant for dementia, HTN, diastolic heart failure, seizure disorder (on Depakote) oropharyngeal dysphagia, who presented from skilled nursing facility with worsening dyspnea and imaging suggestive of multifocal pneumonia, R>L likely secondary to aspiration.   This is hospital day 26. Waiting for placement in residential hospice.  Dysphagia:  S/p MBS on 9/25 and 10/5 which showed worsening swallow function. This morning, patient appears comfortable, mumbles when asked questions, however does not follow command to open her eyes. Cortrak removed 10/15. Per SLP, patient continues to demonstrate poor awareness of PO intake, notes that repeat MBS not warranted at this time. Will continue with comfort feeds of Dysphagia 1/honey thick liquids, though patient's intake since Cortrak removal continues to be minimal. - SLP following - Continue comfort feeds: dysphagia 1/honey thick liquids - Glycopyrrolate 0.2 mg IV Q8H PRN - Palliative and PACE working en  tandem for continued [place] discussion  Goals of Care Disposition Comfort feedings as above, though patient has had minimal PO intake since Cortrak removal. Patient's PACE PCP Linda Hedges) and social worker [name]) are aware and supportive of palliative care efforts to engage patient's family in goals of care conversations.  - Palliative Care following, appreciate their involvement and guidance - Two-provider DNR as of 12/07/19 - Due to patient's poor nutrition status, albumin <2, and inability to tolerate substantive comfort feeds, suspect her life expectancy is less than 2 weeks - Patient's family  supportive of residential hospice. Awaiting placement. - PT: patient is not a candidate for continued rehab; will need long-term care vs hospice  Severe Malnutrition related to chronic illness - s/p Cortrak 10/1-10/15 - Comfort feeds as above with dysphagia 1/honey thick liquids  Acute hypoxic resp failure and Sepsis 2/2 multifocal PNA R>L Pneumonia seems resolved, cough is still weak, remains an aspiration risk as above.  Moderate Diffuse Encephalopathy with Tremor, resolved History of Seizure Disorder  History of Dementia Patient's mental status improved overall. Intermittently not following commands. BP controlled. - Valproate 200 mg Q6H IV - Amantadine 25 mg daily to help with mental status, rigidity. Note that patient has not taken since Cortrak removed 10/15. - Will hold losartan 25 mg daily as her pressures have been at goal without  Diet: dysphagia 1/honey thick liquids IVF: none VTE: Enoxaparin Code: DNR PT/OT recs: Long term care vs hospice  Please contact the on call pager after 5 pm and on weekends at [phone].  Alexandria Lodge, MD IMTS, PGY-1 Pager: [phone] 12/13/2019,5:37 AM

## 2019-12-14 MED ORDER — RESOURCE THICKENUP CLEAR PO POWD
ORAL | Status: DC | PRN
  Filled 2019-12-14: qty 125

## 2019-12-14 NOTE — Progress Notes (Signed)
  Speech Language Pathology Treatment: Dysphagia  Patient Details Name: Nicole Blackwell MRN: [id number] DOB: 1935-09-12 Today's Date: 12/14/2019 Time: 1638-4665 SLP Time Calculation (min) (ACUTE ONLY): 10 min  Assessment / Plan / Recommendation Clinical Impression  Pt was seen for dysphagia treatment. She was alert during the session and communicated verbally with reduced vocal wetness, but congestion still noted. Pt's nurse tech indicated that the pt consumed 100% of breakfast without overt s/sx of aspiration. Pt tolerated dysphagia 1 solids and honey thick liquids via cup without overt s/sx of aspiration. Pt was unable to suck from straw despite verbal and tactile cues. Mild oral holding was noted and an oral/or pharyngeal delay of 5-7 seconds was observed with solids and liquids. Pt exhibited coughing with trials of nectar thick liquids, suggesting continued aspiration. Pt's family has decided on hospice care and pt is [facility]. It is recommended that the current diet of dysphagia 1 (puree) solids and honey thick liquids be continued for comfort. Further acute skilled SLP services are not clinically indicated at this time.    HPI HPI: Nicole Blackwell is a 84 y.o. female with medical history significant for dementia with no behavioral disturbances, essential hypertension, chronic diastolic CHF, seizure disorder on Depakote, oropharyngeal dysphagia, who presented from SNF due to increasing shortness of breath. MBS 9/25: mild oropharyngeal dysphagia c/b trace, transient laryngeal penetration of thin liquid x1 on examination. EEG 9/29: moderate diffuse encephalopathy, nonspecific etiology but could be secondary to toxic-metabolic causes. Per Dr. Rebeca Alert' note on 9/30, pt's daughter, "Nicole Blackwell says Nicole Blackwell would definitely want life saving measures, even if the possibility of success were low." Pt with swallow decline and made NPO 9/27 after ST recommendation.  MBS 10/5 repeated to assess  current function and NPO recommended.  Two provider DNR was conducted on 10/14 and Cotrak removed 10/15.       SLP Plan  Continue with current plan of care;All goals met;Discharge SLP treatment due to (comment)       Recommendations  Diet recommendations: Dysphagia 1 (puree);Honey-thick liquid Liquids provided via: Teaspoon Medication Administration: Via alternative means Supervision: Full supervision/cueing for compensatory strategies;Staff to assist with self feeding Compensations: Minimize environmental distractions;Monitor for anterior loss;Slow rate;Small sips/bites Postural Changes and/or Swallow Maneuvers: Seated upright 90 degrees                Oral Care Recommendations: Oral care BID Follow up Recommendations:  (Pt's family has decided on hospice care.) SLP Visit Diagnosis: Dysphagia, oropharyngeal phase (R13.12) Plan: Continue with current plan of care;All goals met;Discharge SLP treatment due to (comment)       Sunni Richardson I. Hardin Negus, [place], [place] Office number [phone] Pager [place] 12/14/2019, 11:31 AM

## 2019-12-14 NOTE — Progress Notes (Signed)
CSW spoke with PACE SW in reference to pt's change in status and now being able to swallow. PACE SW stated pt is not appropriate to go back to SNF due to not being able to participate in PT/OT. PACE SW will get update on pt tomorrow.

## 2019-12-14 NOTE — Progress Notes (Signed)
HD#27 Subjective:   No acute events overnight.   Evaluated at bedside during rounds. Patient resting in bed, spontaneously awake, gaze forward. Able to state her name clearly, denies any pain, or being hungry at this time. Unable to state where she is. Voice is wet/gurgly. Per chart review, patient was able to eat 100% of her breakfast without issue.  Objective:   Vital signs in last 24 hours: Vitals:   12/13/19 1211 12/13/19 1925 12/13/19 2315 12/14/19 0348  BP: 133/81 (!) 146/63 137/68 (!) 151/76  Pulse: 74 75 80 77  Resp: 16 18 19 16   Temp: 98.1 F (36.7 C) 97.8 F (36.6 C) 98 F (36.7 C) 97.8 F (36.6 C)  TempSrc: Axillary Axillary Axillary Axillary  SpO2:  100% 98% 100%  Weight:    53.5 kg  Height:       Physical Exam:  Gen: Lying in bed, appears comfortable, eyes open spontaneously Cardiac: regular rate and rhythm, no m/r/g Neuro: Eyes open spontaneously, able to state name however does not answer other orientation questions Resp: On room air, voice is wet, transmitted upper airway sounds on auscultation Abd: soft, non-distended Ext: warm, no edema  Assessment/Plan:   Principal Problem:   HCAP (healthcare-associated pneumonia) Active Problems:   Chronic diastolic CHF (congestive heart failure) (HCC)   Vascular dementia without behavioral disturbance (HCC)   Dementia (HCC)   Protein-calorie malnutrition, severe (HCC)   Severe sepsis (HCC)   Goals of care, counseling/discussion   Palliative care by specialist   DNR (do not resuscitate) discussion   Encounter for hospice care discussion   Patient Summary:  Nicole Blackwell is a 84 year old woman with past medical history significant for dementia, HTN, diastolic heart failure, seizure disorder (on Depakote) oropharyngeal dysphagia, who presented from skilled nursing facility with worsening dyspnea and imaging suggestive of multifocal pneumonia, R>L likely secondary to aspiration.   This is hospital day 27. Waiting  for placement in residential hospice [facility]).  Dysphagia:  S/p MBS on 9/25 and 10/5 which showed worsening swallow function. Patient awake spontaneously this morning, at her baseline orientation (to self). Secretions improved with glycopyrrolate. Cortrak removed 10/15. Will continue with comfort feeds of Dysphagia 1/honey thick liquids. Intake improved today, patient reportedly ate 100% of her breakfast.  - SLP has signed off - Continue comfort feeds: dysphagia 1/honey thick liquids - Glycopyrrolate 0.2 mg IV Q8H PRN - Palliative and [facility]ACE working en tandem for continued [place] discussion  Goals of Care Disposition Comfort feedings as above, though PO intake has been minimal since Cortrak removed. Patient's [facility] PCP Nicole Blackwell) and social worker Nicole Blackwell) are aware and supportive of palliative care efforts to engage patient's family in goals of care conversations.  - Palliative Care following, appreciate their involvement and guidance - Two-provider DNR as of 12/07/19 - Due to patient's poor nutrition status, albumin <2, and inability to tolerate substantive comfort feeds, suspect her life expectancy is less than 2 weeks - Patient's family supportive of residential hospice. Awaiting placement. - PT: patient is not a candidate for continued rehab. Family has chosen residential hospice.  Severe Malnutrition related to chronic illness - s/p Cortrak 10/1-10/15 - Comfort feeds as above with dysphagia 1/honey thick liquids  Acute hypoxic resp failure and Sepsis 2/2 multifocal PNA R>L Pneumonia seems resolved, cough is still weak, remains an aspiration risk as above.  Moderate Diffuse Encephalopathy with Tremor, resolved History of Seizure Disorder  History of Dementia Patient's mental status improved and stable over the  last week. BP controlled. - Valproate 200 mg Q6H IV - Amantadine 25 mg daily to help with mental status, rigidity. Note that patient has not taken since  Cortrak removed 10/15. - Will hold losartan 25 mg daily as her pressures have been at goal for the last few days  Diet: dysphagia 1/honey thick liquids IVF: none VTE: Enoxaparin Code: DNR PT/OT recs: Long term care vs hospice  Please contact the on call pager after 5 pm and on weekends at [phone].  Alexandria Lodge, MD IMTS, PGY-1 Pager: [phone] 12/14/2019,6:30 AM

## 2019-12-14 NOTE — Progress Notes (Signed)
Manufacturing engineer [facility]) Hospital Liaison note.    Received request from [place] for family interest in [facility]. [facility] is unable to offer a room today. Hospital Liaison will follow up tomorrow or sooner if a room becomes available.   Please do not hesitate to call with questions.   T hank you for the opportunity to participate in this patient's care.  Chrislyn Edison Pace, BSN, RN [place] (listed on [place] under Hospice/[facility])    [phone]

## 2019-12-15 NOTE — Progress Notes (Signed)
HD#28 Subjective:   No acute events overnight.   Evaluated at bedside during rounds. Seen resting comfortably in bed, spontaneously awake with minimal responses. Did not answer questions on rounds this morning. Mostly groaned in response to any questions asked.   Objective:   Vital signs in last 24 hours: Vitals:   12/14/19 1919 12/14/19 2300 12/15/19 0250 12/15/19 0447  BP: (!) 150/73 (!) 165/90 140/79   Pulse: 99 100 99   Resp: 17 18 20    Temp: 98 F (36.7 C) 98.8 F (37.1 C) 98.6 F (37 C)   TempSrc: Oral Oral Oral   SpO2: 100% 100% 100%   Weight:    52.9 kg  Height:       Physical Exam:  Gen: Lying in bed, appears comfortable, eyes open spontaneously Cardiac: regular rate and rhythm, no m/r/g Neuro: Eyes open spontaneously, able to state name however does not answer other orientation questions Resp: On room air, voice is wet, transmitted upper airway sounds on auscultation Abd: soft, non-distended Ext: warm, no edema  Assessment/Plan:   Principal Problem:   HCAP (healthcare-associated pneumonia) Active Problems:   Chronic diastolic CHF (congestive heart failure) (HCC)   Vascular dementia without behavioral disturbance (HCC)   Dementia (HCC)   Protein-calorie malnutrition, severe (HCC)   Severe sepsis (HCC)   Goals of care, counseling/discussion   Palliative care by specialist   DNR (do not resuscitate) discussion   Encounter for hospice care discussion   Patient Summary:  Nicole Blackwell is a 84 year old woman with past medical history significant for dementia, HTN, diastolic heart failure, seizure disorder (on Depakote) oropharyngeal dysphagia, who presented from skilled nursing facility with worsening dyspnea and imaging suggestive of multifocal pneumonia, R>L likely secondary to aspiration.   This is hospital day 28. Waiting for placement in residential hospice [facility]).  Dysphagia Reevaluated swallowing yesterday, noted to have continued  aspiration. Continue with comfort feeds of Dysphagia 1/honey thick liquids.  - Continue comfort feeds: dysphagia 1/honey thick liquids - Glycopyrrolate 0.2 mg IV Q8H PRN - Palliative and [facility]ACE working en tandem for continued [place] discussion  Goals of Care Disposition Overall plan continues to be for placement at [facility] for residential hospice. Discussed possibility of sending patient to SNF on EOL care on hospice; unfortunately, insurance does not cover this. Also, continuing plan of care with comfort feeds.  - Palliative Care following, appreciate their involvement and guidance - Two-provider DNR as of 12/07/19 - Due to patient's poor nutrition status, albumin <2, and inability to tolerate substantive comfort feeds, suspect her life expectancy is less than 2 weeks - Patient's family supportive of residential hospice. Awaiting placement. - PT: patient is not a candidate for continued rehab. Family has chosen residential hospice.  Severe Malnutrition related to chronic illness - s/p Cortrak 10/1-10/15 - Comfort feeds as above with dysphagia 1/honey thick liquids  Acute hypoxic resp failure and Sepsis 2/2 multifocal PNA R>L Pneumonia seems resolved, cough is still weak, remains an aspiration risk as above.  Moderate Diffuse Encephalopathy with Tremor, resolved History of Seizure Disorder  History of Dementia Patient's mental status improved and stable over the last week.  - Valproate 200 mg Q6H IV - Amantadine 25 mg daily to help with mental status, rigidity. Note that patient has not taken since Cortrak removed 10/15. - Will hold losartan 25 mg daily as her pressures have been at goal for the last few days  Diet: dysphagia 1/honey thick liquids IVF: none VTE: Enoxaparin Code: DNR  PT/OT recs: Long term care vs hospice  Please contact the on call pager after 5 pm and on weekends at [phone].  Alexandria Lodge, MD IMTS, PGY-1 Pager: [phone] 12/15/2019,5:59 AM

## 2019-12-15 NOTE — Progress Notes (Signed)
[facility] ([facility]) Hospital Liaison note.     Received request from TOC manager for family interest in [facility]. [facility] is unable to offer a room today. Hospital Liaison will follow up tomorrow or sooner if a room becomes available.     A Please do not hesitate to call with questions.     Thank you,    Mary Anne Robertson, RN, CCM       [facility] Hospital Liaison (listed on [facility] under Hospice [facility])     [phone]  

## 2019-12-15 NOTE — Plan of Care (Signed)
  Problem: Clinical Measurements: Goal: Will remain free from infection Outcome: Progressing Goal: Cardiovascular complication will be avoided Outcome: Progressing   Problem: Coping: Goal: Level of anxiety will decrease Outcome: Progressing   Problem: Elimination: Goal: Will not experience complications related to bowel motility Outcome: Progressing Goal: Will not experience complications related to urinary retention Outcome: Progressing   Problem: Safety: Goal: Ability to remain free from injury will improve Outcome: Progressing   Problem: Skin Integrity: Goal: Risk for impaired skin integrity will decrease Outcome: Progressing

## 2019-12-15 NOTE — Progress Notes (Signed)
CSW spoke with Trixie Dredge with [facility], no [place] beds available. CSW will continue to follow.

## 2019-12-16 NOTE — Progress Notes (Signed)
Manufacturing engineer [facility]) Hospital Liaison note.    Reviewed pt again with Dr. Tomasa Hosteller, [facility] MD.  It appears that at this time Nicole Blackwell is 20% PPS, documented PO intake.  PNA resolved at this time.  Per [facility] MD pt is hospice appropriate but not [facility] appropriate at this time as life expectancy likely exceeds two weeks.   Per chart pt  is [facility] with [facility] who provide hosp[facility] services. TOC Angela made aware.  If [facility] can support this pt or family in any other way please do not hesitate to call.  Thank you for the opportunity to participate in this patient's care. Chrislyn Edison Pace, BSN, RN [facility] Hospital Liaison (listed on [facility] under Hospice[facility])    [phone]   (24h on call)

## 2019-12-16 NOTE — Progress Notes (Signed)
HD#29 Subjective:   No acute events overnight.   Evaluated at bedside during rounds. Seen resting comfortably in bed, opens eyes to voice. Voice is clearer, and patient appears more alert this morning. Denies pain, hunger, thirst.  Objective:   Vital signs in last 24 hours: Vitals:   12/15/19 2314 12/16/19 0246 12/16/19 0400 12/16/19 0900  BP: (!) 148/72 (!) 144/72  (!) 150/75  Pulse: 75 80  78  Resp: 17 17  16   Temp: 98.4 F (36.9 C) 98.2 F (36.8 C)  (!) 97.4 F (36.3 C)  TempSrc: Oral Oral  Oral  SpO2: 100% 100%  100%  Weight:   53.3 kg   Height:       Physical Exam:  Gen: Lying in bed, appears comfortable, sleeping at first but opens eyes to voice Cardiac: regular rate and rhythm, no m/r/g Neuro: Eyes open to voice, able to state full name, answers "at home" when asked location, patient is more alert this morning  Resp: On room air, voice is clearer and less wet this morning Abd: soft, non-distended Ext: warm, no edema  Assessment/Plan:   Principal Problem:   HCAP (healthcare-associated pneumonia) Active Problems:   Chronic diastolic CHF (congestive heart failure) (HCC)   Vascular dementia without behavioral disturbance (HCC)   Dementia (HCC)   Protein-calorie malnutrition, severe (HCC)   Severe sepsis ([place])   Goals of care, counseling/discussion   Palliative care by specialist   DNR (do not resuscitate) discussion   Encounter for hospice care discussion   Patient Summary:  Nicole Blackwell is a 84 year old woman with past medical history significant for dementia, HTN, diastolic heart failure, seizure disorder (on Depakote) oropharyngeal dysphagia, who presented from skilled nursing facility with worsening dyspnea and imaging suggestive of multifocal pneumonia, R>L likely secondary to aspiration.   This is hospital day 29.   Dysphagia Patient with intermittent tolerance of PO intake. Continue with comfort feeds of Dysphagia 1/honey thick liquids.  - Continue  comfort feeds: dysphagia 1/honey thick liquids - Glycopyrrolate 0.2 mg IV Q8H PRN - Palliative and [facility]ACE working en tandem for continued [place] discussion  Goals of Care Disposition [place] liaison states patient is not [facility] appropriate. [facility] is aware and will contact our [facility] team on Monday regarding alternative placement. Discussed possibility of sending patient to SNF on EOL care on hospice; unfortunately, insurance does not cover this. Also, continuing plan of care with comfort feeds.  - Palliative Care following, appreciate their involvement and guidance - Two-provider DNR as of 12/07/19 - Due to patient's poor nutrition status, albumin <2, and inability to tolerate substantive comfort feeds, suspect her life expectancy is less than 2 weeks - Patient's family supportive of hospice.  - PT: patient is not a candidate for continued rehab. Family has chosen residential hospice.  Severe Malnutrition related to chronic illness - s/p Cortrak 10/1-10/15 - Comfort feeds as above with dysphagia 1/honey thick liquids  Acute hypoxic resp failure and Sepsis 2/2 multifocal PNA R>L Pneumonia seems resolved, cough is still weak, remains an aspiration risk as above.  Moderate Diffuse Encephalopathy with Tremor, resolved History of Seizure Disorder  History of Dementia Patient's mental status improved and stable over the last week.  - Valproate 200 mg Q6H IV - Amantadine 25 mg daily to help with mental status, rigidity. Note that patient has not taken since Cortrak removed 10/15. - Will hold losartan 25 mg daily as her pressures have been at goal for the last few days  Diet: dysphagia 1/honey thick liquids IVF: none VTE: Enoxaparin Code: DNR PT/OT recs: Long term care vs hospice  Please contact the on call pager after 5 pm and on weekends at [phone].  Nicole Lodge, MD IMTS, PGY-1 Pager: [phone] 12/16/2019,2:08 PM

## 2019-12-16 NOTE — TOC Progression Note (Signed)
Transition of Care [facility]) - Progression Note    Patient Details  Name: Nicole Blackwell MRN: [id number] Date of Birth: 02-17-1936  Transition of Care [facility]) CM/SW Contact  Sharin Mons, RN Phone Number: 12/16/2019, 11:01 AM  Clinical Narrative:    NCM spoke with palliative liaison regarding pt's next level of care, ? Home with hospice care. Per liaison pt is not [employer] appropriate. NCM shared information with daughter [name]. Dense reinforced pt is a client with [facility] and if mom needs hospice care @ home [facility] will have to assist with providing supervision needed ( 24/7). [name] states she is in classes during the morning hours and afterwards job seeking. States she's able to assist by 5pm, M-F. NCM called [facility] and spoke with on call nurse Mardene Celeste and information shared. [facility] nurse shared they will contact [facility] team on Monday on how to move forward with pt.  Brihana Quickel (Daughter)     [phone]       [facility] team will continue to monitor  and follow ....  Expected Discharge Plan: Home w Hospice Care Barriers to Discharge: Continued Medical Work up  Expected Discharge Plan and Services Expected Discharge Plan: [place] Determinants of Health (SDOH) Interventions    Readmission Risk Interventions No flowsheet data found.

## 2019-12-17 NOTE — Progress Notes (Signed)
    HD#30 Subjective:   No acute events overnight.   Seen resting comfortably in bed, more alert this morning.  Patient had her eyes open during the evaluation today with responses to interview questions.  Denied any pain or discomfort at this time.  Objective:   Vital signs in last 24 hours: Vitals:   12/16/19 1615 12/16/19 2236 12/17/19 0024 12/17/19 0724  BP: (!) 130/106 (!) 153/70 135/71 (!) 159/82  Pulse: 78 67 63 82  Resp: 16 16 17    Temp: 98.7 F (37.1 C) 98 F (36.7 C) 98 F (36.7 C) 97.6 F (36.4 C)  TempSrc: Axillary Oral Oral Oral  SpO2: 100% 100% 100% 100%  Weight:      Height:       Physical Exam:  Gen: Lying in bed, appears comfortable, more alert and responsive today CV: regular rate and rhythm, no m/r/g Neuro:  Remained alert today with eyes open, able to state name Abd: soft, non-distended Ext: warm, no edema  Assessment/Plan:   Principal Problem:   HCAP (healthcare-associated pneumonia) Active Problems:   Chronic diastolic CHF (congestive heart failure) (HCC)   Vascular dementia without behavioral disturbance (HCC)   Dementia (HCC)   Protein-calorie malnutrition, severe (HCC)   Severe sepsis (HCC)   Goals of care, counseling/discussion   Palliative care by specialist   DNR (do not resuscitate) discussion   Encounter for hospice care discussion   Patient Summary:  Nicole Blackwell is a 84 year old woman with past medical history significant for dementia, HTN, diastolic heart failure, seizure disorder (on Depakote) oropharyngeal dysphagia, who presented from skilled nursing facility with worsening dyspnea and imaging suggestive of multifocal pneumonia, R>L likely secondary to aspiration.   This is hospital day 30.   Dysphagia Severe Malnutrition related to chronic illness  Stable condition. S/p cortrak 10/1-10/15.  - Continue comfort feeds: dysphagia 1/honey thick liquids - Glycopyrrolate 0.2 mg IV Q8H PRN - Palliative and [facility]ACE working en tandem  for continued [place] discussion  Goals of Care Disposition [place] liaison states patient is not Opt[facility] appropriate. [facility] is aware and will contact our [facility] team on Monday regarding alternative placement.  Question home with hospice care. - Palliative Care following, appreciate their involvement and guidance - Two-provider DNR as of 12/07/19 - Due to patient's poor nutrition status, albumin <2, and inability to tolerate substantive comfort feeds, suspect her life expectancy is less than 2 weeks - Patient's family supportive of hospice.  - PT: patient is not a candidate for continued rehab  Acute hypoxic resp failure and Sepsis 2/2 multifocal PNA R>L Resolved. Remains an aspiration due to dysphagia.  Moderate Diffuse Encephalopathy with Tremor, resolved History of Seizure Disorder  History of Dementia Stable.  - Valproate 200 mg Q6H IV - currently no taking amantadine 25 mg, since cortrak was removed   Diet: dysphagia 1/honey thick liquids IVF: none VTE: Enoxaparin Code: DNR PT/OT recs: Long term care vs hospice  Please contact the on call pager after 5 pm and on weekends at [phone].  Harlow Ohms, DO IMTS, PGY-2 Pager: [phone] 12/17/2019,8:22 AM

## 2019-12-18 MED ORDER — VALPROIC ACID 250 MG/5ML PO SOLN
200.0000 mg | Freq: Four times a day (QID) | ORAL | Status: DC
  Administered 2019-12-18 – 2019-12-19 (×4): 200 mg
  Filled 2019-12-18 (×6): qty 5

## 2019-12-18 NOTE — TOC Progression Note (Addendum)
Transition of Care [facility]) - Progression Note    Patient Details  Name: DELOYCE WALTHERS MRN: [id number] Date of Birth: 1935-09-03  Transition of Care [facility]) CM/SW Contact  Zenon Mayo, RN Phone Number: 12/18/2019, 9:51 AM  Clinical Narrative:    NCM spoke with Raquel Sarna with [facility] , she states that patient can not go home, she states they will put patient under Medical Management stay with one of their contracted SNF facilities, [facility] or [facility]. She states to send information out to their contracted facilities so they can look at her,  She will call [facility] to see if they will take her back and then Raquel Sarna will let us know. NCM informed CSW of information also.  1615- Per Raquel Sarna at [facility], [facility] will not be able to take patient back if she is on nebulizers because she will have to be in an isolation room and they do not have any available.  Per MD patient will not be on nebs at discharge.  Expected Discharge Plan: Home w Hospice Care Barriers to Discharge: Continued Medical Work up  Expected Discharge Plan and Services Expected Discharge Plan: [place] Determinants of Health (SDOH) Interventions    Readmission Risk Interventions No flowsheet data found.

## 2019-12-18 NOTE — Progress Notes (Signed)
CSW spoke with Raquel Sarna, SW at [facility], stated pt can return to SNF. CSW sent out offers to [facility] SNF's, waiting on acceptance, CSW will continue to follow.

## 2019-12-18 NOTE — Progress Notes (Signed)
    HD#31 Subjective:   No acute events overnight. Per chart review, patient has been eating 5-40% of meals.  Patient is awake and alert this morning, states she is doing well. Denies pain or hunger. When asked where she is, she responds "your house."  Objective:   Vital signs in last 24 hours: Vitals:   12/17/19 1602 12/17/19 2155 12/18/19 0300 12/18/19 0433  BP: (!) 120/92 139/76  (!) 147/76  Pulse: 78 88  96  Resp: 16 16  17   Temp: (!) 97.5 F (36.4 C) 98.7 F (37.1 C)  98.9 F (37.2 C)  TempSrc: Oral Oral  Oral  SpO2: 100% 100%  100%  Weight:   53.9 kg   Height:       Physical Exam:  Gen: Lying in bed, appears comfortable, alert and awake CV: regular rate and rhythm, no m/r/g Neuro:  Remains alert today with eyes open, voice is clear Abd: soft, non-distended Ext: warm, no edema  Assessment/Plan:   Principal Problem:   HCAP (healthcare-associated pneumonia) Active Problems:   Chronic diastolic CHF (congestive heart failure) (HCC)   Vascular dementia without behavioral disturbance (HCC)   Dementia (HCC)   Protein-calorie malnutrition, severe (HCC)   Severe sepsis (HCC)   Goals of care, counseling/discussion   Palliative care by specialist   DNR (do not resuscitate) discussion   Encounter for hospice care discussion   Patient Summary:  Nicole Blackwell is a 84 year old woman with past medical history significant for dementia, HTN, diastolic heart failure, seizure disorder (on Depakote) oropharyngeal dysphagia, who presented from skilled nursing facility with worsening dyspnea and imaging suggestive of multifocal pneumonia, R>L likely secondary to aspiration.   This is hospital day 31. Patient is pending SNF placement.  Dysphagia Severe Malnutrition related to chronic illness  Stable condition. S/p cortrak 10/1-10/15.  - Continue comfort feeds: dysphagia 1/honey thick liquids - Glycopyrrolate 0.2 mg IV Q8H PRN  Goals of Care Disposition [facility] liaison  states patient is not Optometrist appropriate given her PO intake. [facility] is aware and in contact with our [facility] team. Our [facility] team has sent out offers to [facility] SNFs. - Two-provider DNR as of 12/07/19 - Patient's family supportive of hospice - PT: patient is not a candidate for continued rehab  Acute hypoxic resp failure and Sepsis 2/2 multifocal PNA R>L Resolved. Remains an aspiration due to dysphagia.  Moderate Diffuse Encephalopathy with Tremor, resolved History of Seizure Disorder  History of Dementia Stable.  - Valproate 200 mg Q6H IV - amantadine 25 mg solution  Diet: dysphagia 1/honey thick liquids IVF: none VTE: Enoxaparin Code: DNR PT/OT recs: Long term care vs hospice  Please contact the on call pager after 5 pm and on weekends at [phone].  Nicole Lodge, MD IMTS, PGY-1 Pager: [phone] 12/18/2019,7:04 AM

## 2019-12-18 NOTE — Progress Notes (Addendum)
Engineer, maintenance [facility]) Hospital Liaison note.   Patient not eligible for [facility] but is hospice appropriate, per [facility] note will wait to see direction from [facility] for the next steps in plan for discharge.   Feel free to call with any hospice related questions. [facility] will help out with any discharge plans as needed.  Clementeen Hoof, BSN, [facility] (in [place]) [phone]

## 2019-12-19 DIAGNOSIS — I5033 Acute on chronic diastolic (congestive) heart failure: Secondary | ICD-10-CM

## 2019-12-19 LAB — SARS CORONAVIRUS 2 BY RT PCR (HOSPITAL ORDER, PERFORMED IN ~~LOC~~ HOSPITAL LAB): SARS Coronavirus 2: NEGATIVE

## 2019-12-19 MED ORDER — VALPROIC ACID 250 MG/5ML PO SOLN: 200.0000 mg | Freq: Four times a day (QID) | ORAL | Status: AC

## 2019-12-19 MED ORDER — GERHARDT'S BUTT CREAM: 1.0000 "application " | TOPICAL_CREAM | Freq: Two times a day (BID) | CUTANEOUS | Status: AC

## 2019-12-19 MED ORDER — ASPIRIN 81 MG PO CHEW: 81.0000 mg | CHEWABLE_TABLET | Freq: Every day | ORAL | Status: AC

## 2019-12-19 MED ORDER — AMANTADINE HCL 50 MG/5ML PO SYRP: 25.0000 mg | ORAL_SOLUTION | Freq: Every day | ORAL | 0 refills | Status: AC

## 2019-12-19 NOTE — Discharge Summary (Signed)
Name: Nicole Blackwell MRN: [id number] DOB: 11-Nov-1935 84 y.o. PCP: Patient, No Pcp Per  Date of Admission: 11/17/2019 12:38 AM Date of Discharge: 12/19/19 Attending Physician: Dr. Aldine Contes  Discharge Diagnosis:  1. Healthcare-associated pneumonia 2. Vascular dementia without behavioral disturbance 3. Chronic diastolic congestive heart failure 4. Protein-calorie malnutrition, severe 5. Severe sepsis 6. History of Seizure Disorder  7. Oropharyngeal dysphagia 8. Stage II pressure injury of sacrum  Discharge Medications: Allergies as of 12/19/2019      Reactions   Penicillin G Benzathine Swelling   Did it involve swelling of the face/tongue/throat, SOB, or low BP? Unknown Did it involve sudden or severe rash/hives, skin peeling, or any reaction on the inside of your mouth or nose? Unknown Did you need to seek medical attention at a hospital or doctor's office? Unknown When did it last happen?unknown If all above answers are "NO", may proceed with cephalosporin use.   Penicillins Swelling   Did it involve swelling of the face/tongue/throat, SOB, or low BP? Unknown Did it involve sudden or severe rash/hives, skin peeling, or any reaction on the inside of your mouth or nose? Unknown Did you need to seek medical attention at a hospital or doctor's office? Unknown When did it last happen?unknown If all above answers are "NO", may proceed with cephalosporin use.      Medication List    STOP taking these medications   acetaminophen 325 MG tablet Commonly known as: TYLENOL   aspirin EC 81 MG tablet Replaced by: aspirin 81 MG chewable tablet   cholecalciferol 25 MCG (1000 UNIT) tablet Commonly known as: VITAMIN D3   divalproex 250 MG DR tablet Commonly known as: Depakote   feeding supplement (ENSURE) Pudg   lactulose 10 GM/15ML solution Commonly known as: CHRONULAC   levETIRAcetam 250 MG tablet Commonly known as: Keppra   losartan 25 MG  tablet Commonly known as: COZAAR   PROSTATE PO   vitamin B-12 100 MCG tablet Commonly known as: CYANOCOBALAMIN   Vitamin D (Ergocalciferol) 1.25 MG (50000 UNIT) Caps capsule Commonly known as: DRISDOL     TAKE these medications   amantadine 50 MG/5ML solution Commonly known as: SYMMETREL Take 2.5 mLs (25 mg total) by mouth daily. Start taking on: December 20, 2019   aspirin 81 MG chewable tablet Chew 1 tablet (81 mg total) by mouth at bedtime. Replaces: aspirin EC 81 MG tablet   bisacodyl 10 MG suppository Commonly known as: DULCOLAX Place 10 mg rectally once as needed for moderate constipation.   Gerhardt's butt cream Crea Apply 1 application topically 2 (two) times daily.   guaiFENesin 100 MG/5ML liquid Commonly known as: ROBITUSSIN Take 200 mg by mouth every 6 (six) hours as needed for cough.   magnesium hydroxide 400 MG/5ML suspension Commonly known as: MILK OF MAGNESIA Take 30 mLs by mouth once as needed for mild constipation.   valproic acid 250 MG/5ML solution Commonly known as: DEPAKENE Take 4 mLs (200 mg total) by mouth 4 (four) times daily.            Discharge Care Instructions  (From admission, onward)         Start     Ordered   12/19/19 0000  Leave dressing on - Keep it clean, dry, and intact until clinic visit        12/19/19 1341          Disposition and follow-up:   Nicole Blackwell was discharged from [facility] in [place]  condition.  At the hospital follow up visit please address:  1.    Dysphagia - Speech pathology here recommend dysphagia 1 diet with honey-thick liquids with aspiration precautions - Consider glycopyrrolate (Robinul) for increased secretions  History of seizure disorder History of dementia - Patient continued on valproic acid solution 250 MG/5ML solution 200 mg four times daily - Patient started on amantadine (SYMMETREL) 50 MG/5ML solution 25 mg daily   Stage 2 Sacral Pressure Injury  - Noted  by nursing on 12/19/19 as partial thickness loss of dermis presenting as a shallow open injury with a red, pink wound bed without slough. - Continue to monitor, wound care as appropriate  Two-provider DNR - Established on 12/07/19  2.  Labs / imaging needed at time of follow-up: none  3.  Pending labs/ test needing follow-up: none  Follow-up Appointments:  None currently scheduled  Hospital Course by problem list:  Nicole Blackwell is a 84 year old woman with past medical history significant for dementia, HTN, diastolic heart failure, seizure disorder (on Depakote), oropharyngeal dysphagia, who presented from skilled nursing facility on 11/17/19 with worsening dyspnea and imaging suggestive of multifocal pneumonia, right greater than left, likely secondary to aspiration.   Acute hypoxic respiratory failure and severe sepsis secondary multifocal pneumonia R>L Patient with leukopenia, lactic acid 8.1, tachypnea, and chest x-ray consistent with multifocal pneumonia. She was started on IVF and IV antibiotics and admitted to the ICU due to respiratory distress. She did not require intubation. She improved clinically and was transferred to the floor on 11/18/19 at which time the Internal Medicine Teaching Service took over her care. She was treated with one day of IV vancomycin, levofloxacin, and metronidazole before switching to cefepime on 11/18/19.Blood and urine cultures negative. Legionella serotype 1 urine antigen negative. Cefepime discontinued on 11/23/19 after 6 days of treatment secondary to concern for its contribution to the patient's encephalopathy. Repeat CXR 11/22/19 showed improved multifocal pneumonia without significant pulmonary edema. Rapid response called by nursing on 11/25/19 AM, and due to concern for patient's mental status and pulmonary exam, tube feeds stopped, repeat CXR unchanged from prior. Patient remained at risk of aspiration during hospitalization.  Moderate Diffuse Encephalopathy  with Tremor, resolved History of Seizure Disorder  History of Dementia Patient takes Depakote 250 mg twice daily at home and was on IV valproate here. On hospital day 5 (11/22/19), patient noted to have worsening encephalopathy manifested by intermittent episodes of decreased arousal and responsiveness with resting tremor vs myoclonus, concerning for partial seizure vs worsening respiratory status vs Cefepime effect. Neurology was consulted. EEG showed moderate diffuse encephalopathy without active seizures or definite epileptiform discharges. Valproate levels found to be low, and patient was given a loading dose and dose was increased to 250 mg three times daily. MRI brain obtained 11/23/19 negative for intracranial abnormality. Bladder scan without urinary retention. 9/30 repeat respiratory PCR panel negative, repeat sputum culture with few normal respiratory flora. Amantadine 25 mg started on 10/1 per neurology to help with mental status and rigidity. Patient's rising blood pressure was treated with IV hydralazine initially however eventually able to transition to home losartan. Rapid response called by nursing on 11/25/19 AM, and due to concern for patient's mental status and pulmonary exam, tube feeds stopped, repeat CXR unchanged from prior. On 11/26/19, patient's mental status much improved, she was spontaneously awake, following commands, oriented to self which is her baseline due to her advanced dementia. Her mental status subsequently stabilized with occasional waxing and waning. By discharge, her  mental status was stable at her baseline.  Oropharyngeal dysphagia Protein-calorie malnutrition Patient with history of oropharyngeal dysphagia per [facility]. SLP was consulted. Had a modified barium swallow (MBS) study initially on 9/25 after which a dysphagia 2 diet was recommended and patient deemed at mild risk for aspiration. Around the time of the patient's worsening encephalopathy (see above), she was noted  to have more difficulty clearing her secretions. Cortrak placed on 11/24/19 as the patient had 3 days without nutrition and multiple days before that. Despite improvement in her mental status, she remained a high aspiration risk. Repeat MBS on 10/5 demonstrated even further decreased swallow function compared to 9/25 MBS. SLP recommended continued NPO with fair/questionable prognosis for swallow. Confirmed with patient's family that they would like to proceed with gastrostomy tube placement. IR was consulted and determined based on their evaluation with CT abdomen pelvis without contrast that patient is a poor candidate for percutaneous placement secondary to interposition of colon/small bowel/liver. IR recommend general surgery consult for open gastrostomy. Palliative care discussed results with daughter and that the PEG tube placement via surgical route would be of no benefit. The team reviewed the patient's clinical course with the patient's daughter and recommended removal of the feeding tube and initiation of comfort feeds. Cotrak was removed on 12/08/19, and comfort feeds with dysphagia 1/honey thick liquids were initiated. The patient's oral intake was modest.   Disposition Goals of Care Discussion with patient's [facility] primary care physician initiated on 11/28/19. Palliative care consulted and facilitated goals of care conversation with the patient's family. Patient's family continued to specify full scope of care/full code. Palliative care recommended comfort feedings for end-stage dementia. Two-provider DNR was established on 12/07/19. Overtime, through ongoing conversations, the patient's daughter was accepting of hospice/comfort care, and the patient was discharged to skilled nursing with hospice.  Acute on heart failure with preserved ejection fraction Volume overload secondary to hydration therapy on arrival On admission, patient with elevated BNP and bilateral JVD after receiving IV fluid in the  ED per sepsis protocol. She was diuresed with IV Lasix. Echocardiogram on 11/17/2019 showed EF of 60-65%, right ventricular pressure and volume overload. Prior echo on 08/28/2018 with EF of 60-65% and moderate concentric LV hypertrophy. Patient's volume status was monitored closely during this hospitalization, and diuretics were held during admission and at discharge.  Discharge Vitals:   BP (!) 160/88 (BP Location: Right Arm)   Pulse 90   Temp 98.3 F (36.8 C) (Oral)   Resp 16   Ht 5\' 5"  (1.651 m)   Wt 53.9 kg   SpO2 100%   BMI 19.77 kg/m   Pertinent Labs, Studies, and Procedures:   11/30/19 CT ABDOMEN WITHOUT CONTRAST  TECHNIQUE: Multidetector CT imaging of the abdomen was performed following the standard protocol without IV contrast.  COMPARISON:  CT abdomen pelvis-12/18/2018  FINDINGS: The lack of intravenous contrast limits the ability to evaluate solid abdominal organs.  Lower chest: Limited visualization of the lower thorax demonstrates trace/small bilateral effusions with associated bibasilar opacities. Borderline cardiomegaly. No pericardial effusion.  Hepatobiliary: Normal hepatic contour. Post cholecystectomy. No ascites.  Pancreas: Normal noncontrast appearance of the pancreas.  Spleen: Normal noncontrast appearance of the spleen.  Adrenals/Urinary Tract: Normal noncontrast appearance of the bilateral kidneys. No renal stones. No urine obstruction or perinephric stranding.  Normal noncontrast appearance the bilateral adrenal glands. The urinary bladder was not imaged.  Stomach/Bowel: There is interposition of the transverse colon and several loops of small bowel as well as a  portion of the lateral segment of the left lobe of the liver between the anterior wall of the stomach and ventral wall of the upper abdomen.  Enteric tube tip terminates within the gastric antrum. Enteric contrast is seen within the colon. No evidence of enteric obstruction.  No pneumoperitoneum, pneumatosis or portal venous gas.  Vascular/Lymphatic: Atherosclerotic plaque within a normal caliber abdominal aorta.  No definitive bulky retroperitoneal or mesenteric adenopathy on this noncontrast examination.  Other: Diffuse body wall anasarca.  Musculoskeletal: There is a moderate (approximately 50%) compression deformity involving the superior endplate of the L3 vertebral body which appears subacute with end plate sclerosis and without associated paraspinal hematoma though new compared to the 12/18/2018 examination  IMPRESSION: 1. Suboptimal candidate for percutaneous gastrostomy tube placement given interposition of the transverse colon, several loops of small bowel as well as a portion of the lateral segment of the left lobe of the liver - Potential strategies could include attempted gastrostomy tube placement with barium administration (either via the enteric tube or via an enema) with gastric insufflation versus proceeding with definitive surgical consultation. 2. Moderate (approximately 30%) compression deformity involving the superior endplate of the L3 vertebral body appears subacute though new compared to the 11/2018 examination. Correlation point tenderness at this location is advised. 3.  Aortic Atherosclerosis (ICD10-I70.0).  11/23/19 MRI HEAD WITHOUT AND WITH CONTRAST  TECHNIQUE: Multiplanar, multiecho pulse sequences of the brain and surrounding structures were obtained without and with intravenous contrast.  CONTRAST:  30mL GADAVIST GADOBUTROL 1 MMOL/ML IV SOLN  COMPARISON:  CT head 11/22/2019, MRI 09/12/2019  FINDINGS: Brain: No acute infarct. No acute hemorrhage. Similar advanced generalized cerebral volume loss with ex vacuo ventricular dilation. No hydrocephalus. Similar confluent T2/FLAIR hyperintense white matter changes, compatible with chronic microvascular ischemic disease. Punctate foci of susceptibility artifact  in the right occipital lobe and left parietal lobe, compatible with prior microhemorrhages. No abnormal enhancement.  Vascular: Major proximal arterial flow voids are maintained at the skull base.  Skull and upper cervical spine: Normal marrow signal. Degenerative changes.  Sinuses/Orbits: Clear sinuses.  No acute orbital abnormality.  Other: Small bilateral mastoid effusions.  IMPRESSION: 1. No acute intracranial abnormality. 2. Similar advanced cerebral volume loss with chronic microvascular ischemic change.  11/17/19 CT CHEST WO CONTRAST  CLINICAL DATA:  Respiratory failure EXAM: CT CHEST WITHOUT CONTRAST TECHNIQUE: Multidetector CT imaging of the chest was performed following the standard protocol without IV contrast. COMPARISON:  10/06/2016 FINDINGS: Cardiovascular: Cardiac size is at the upper limits of normal. Minimal coronary artery calcification. No pericardial effusion. There is marked enlargement of the central pulmonary arteries in keeping with changes of pulmonary arterial hypertension. Mild atherosclerotic calcification is seen within the thoracic aorta. No aortic aneurysm. Mediastinum/Nodes: No pathologic thoracic adenopathy. Thyroid unremarkable. Lungs/Pleura: There is extensive multifocal consolidation, most severe within the right upper lobe, in keeping with multifocal pneumonia in the appropriate clinical setting. Evaluation is limited by motion artifact and expiratory phase imaging, however, no central obstructing mass is identified. Small right and trace left pleural effusions are present. No pneumothorax. Upper Abdomen: Cholecystectomy has been performed. Musculoskeletal: No acute bone abnormality. IMPRESSION: Extensive multifocal pulmonary consolidation in keeping with multifocal pneumonia in the appropriate clinical setting. No central obstructing mass. Marked pulmonary arterial enlargement in keeping with pulmonary artery hypertension. Aortic Atherosclerosis  (ICD10-I70.0).  It is Electronically Signed   By: Fidela Salisbury MD   On: 11/17/2019 05:10   11/17/19 DG Chest Portable 1 View  Result Date: 11/17/2019 CLINICAL DATA:  Increasing shortness of breath EXAM: PORTABLE CHEST 1 VIEW COMPARISON:  09/12/2019 FINDINGS: Cardiac shadow is within normal limits. Aortic calcifications are seen. Patchy bilateral airspace opacities are noted worst in the right upper lobe. Given the abrupt onset from the prior exam and likely represent multifocal pneumonia. No sizable effusion is seen. No bony abnormality is noted. IMPRESSION: Bilateral infiltrates worse in the right upper lobe. Followup PA and lateral chest X-ray is recommended in 3-4 weeks following trial of antibiotic therapy to ensure resolution and exclude underlying malignancy. Electronically Signed   By: Inez Catalina M.D.   On: 11/17/2019 01:06   11/17/19 ECHOCARDIOGRAM COMPLETE     ECHOCARDIOGRAM REPORT   Patient Name:   MYRLENE RIERA Sohm Date of Exam: 11/17/2019 Medical Rec #:  [id number]    Height:       65.0 in Accession #:    [id number]   Weight:       154.3 lb Date of Birth:  September 20, 1935    BSA:          1.772 m Patient Age:    71 years     BP:           138/89 mmHg Patient Gender: F            HR:           111 bpm. Exam Location:  Inpatient Procedure: 2D Echo Indications:     CHF-Acute Diastolic Q65.78  History:         Patient has prior history of Echocardiogram examinations, most                  recent 08/28/2018. Signs/Symptoms:Syncope; Risk                  Factors:Hypertension and Non-Smoker.  Sonographer:     Mikki Santee RDCS (AE) Referring Phys:  [place] Diagnosing Phys: Eleonore Chiquito MD IMPRESSIONS  1. Left ventricular ejection fraction, by estimation, is 60 to 65%. The left ventricle has normal function. The left ventricle has no regional wall motion abnormalities. There is mild asymmetric left ventricular hypertrophy of the basal-septal segment. Indeterminate diastolic filling due to E-A  fusion. There is the interventricular septum is flattened in systole and diastole, consistent with right ventricular pressure and volume overload.  2. Right ventricular systolic function is mildly reduced. The right ventricular size is moderately enlarged. There is mildly elevated pulmonary artery systolic pressure. The estimated right ventricular systolic pressure is 46.9 mmHg.  3. Left atrial size was mildly dilated.  4. The interatrial septum appears aneurysmal but there is not shunt detected by color doppler. Would consider limited bubble study if clinically indicated.  5. A small pericardial effusion is present. The pericardial effusion is posterior to the left ventricle. There is no evidence of cardiac tamponade.  6. The mitral valve is degenerative. Trivial mitral valve regurgitation. No evidence of mitral stenosis.  7. The aortic valve is tricuspid. Aortic valve regurgitation is not visualized. No aortic stenosis is present.  8. The inferior vena cava is dilated in size with >50% respiratory variability, suggesting right atrial pressure of 8 mmHg. Comparison(s): No significant change from prior study. FINDINGS  Left Ventricle: Left ventricular ejection fraction, by estimation, is 60 to 65%. The left ventricle has normal function. The left ventricle has no regional wall motion abnormalities. The left ventricular internal cavity size was normal in size. There is  mild asymmetric left ventricular hypertrophy of the basal-septal segment. The interventricular septum  is flattened in systole and diastole, consistent with right ventricular pressure and volume overload. Indeterminate diastolic filling due to E-A fusion. Right Ventricle: The right ventricular size is moderately enlarged. No increase in right ventricular wall thickness. Right ventricular systolic function is mildly reduced. There is mildly elevated pulmonary artery systolic pressure. The tricuspid regurgitant velocity is 3.17 m/s, and with an assumed  right atrial pressure of 8 mmHg, the estimated right ventricular systolic pressure is 08.1 mmHg. Left Atrium: Left atrial size was mildly dilated. Right Atrium: Right atrial size was normal in size. Prominent Eustachian valve. Pericardium: A small pericardial effusion is present. The pericardial effusion is posterior to the left ventricle. There is no evidence of cardiac tamponade. Mitral Valve: The mitral valve is degenerative in appearance. Trivial mitral valve regurgitation. No evidence of mitral valve stenosis. Tricuspid Valve: The tricuspid valve is grossly normal. Tricuspid valve regurgitation is mild . No evidence of tricuspid stenosis. Aortic Valve: The aortic valve is tricuspid. Aortic valve regurgitation is not visualized. No aortic stenosis is present. Pulmonic Valve: The pulmonic valve was grossly normal. Pulmonic valve regurgitation is not visualized. No evidence of pulmonic stenosis. Aorta: The aortic root and ascending aorta are structurally normal, with no evidence of dilitation. Venous: The inferior vena cava is dilated in size with greater than 50% respiratory variability, suggesting right atrial pressure of 8 mmHg. IAS/Shunts: The interatrial septum is aneurysmal. No atrial level shunt detected by color flow Doppler.  LEFT VENTRICLE PLAX 2D LVIDd:         3.60 cm LVIDs:         2.40 cm LV PW:         0.90 cm LV IVS:        1.20 cm LVOT diam:     2.10 cm LV SV:         57 LV SV Index:   32 LVOT Area:     3.46 cm  RIGHT VENTRICLE TAPSE (M-mode): 2.3 cm LEFT ATRIUM             Index       RIGHT ATRIUM           Index LA diam:        3.90 cm 2.20 cm/m  RA Area:     18.00 cm LA Vol (A2C):   59.4 ml 33.53 ml/m RA Volume:   50.40 ml  28.45 ml/m LA Vol (A4C):   63.5 ml 35.84 ml/m LA Biplane Vol: 63.6 ml 35.90 ml/m  AORTIC VALVE LVOT Vmax:   85.80 cm/s LVOT Vmean:  67.000 cm/s LVOT VTI:    0.166 m  AORTA Ao Root diam: 2.90 cm MITRAL VALVE                TRICUSPID VALVE MV Area (PHT): 5.02 cm     TR  Peak grad:   40.2 mmHg MV Decel Time: 151 msec     TR Vmax:        317.00 cm/s MV E velocity: 108.00 cm/s MV A velocity: 103.00 cm/s  SHUNTS MV E/A ratio:  1.05         Systemic VTI:  0.17 m                             Systemic Diam: 2.10 cm Eleonore Chiquito MD Electronically signed by Eleonore Chiquito MD Signature Date/Time: 11/17/2019/3:50:47 PM    Final (Updated)     Discharge Instructions: Discharge  Instructions    Discharge instructions   Complete by: As directed    Ms. Obara, it was a pleasure taking care of you. You were admitted to the hospital and treated for pneumonia. Your hospital day was complicated by confusion and difficulty swallowing. Your confusion improved significantly, but you have still had some trouble swallowing. Continue on your dysphagia diet with honey-thick liquids and aspiration precautions.  Take care!   Leave dressing on - Keep it clean, dry, and intact until clinic visit   Complete by: As directed       Signed: Alexandria Lodge, MD 12/19/2019, 1:51 PM   Pager: [phone]

## 2019-12-19 NOTE — Progress Notes (Signed)
Nutrition Follow-up  DOCUMENTATION CODES:   Severe malnutrition in context of chronic illness  INTERVENTION:   - Continue dysphagia 1 diet with honey-thick liquids for comfort  NUTRITION DIAGNOSIS:   Severe Malnutrition related to chronic illness (dementia, dysphagia, CHF) as evidenced by moderate fat depletion, severe muscle depletion, percent weight loss (20.8% weight loss in less than 1 year).  Ongoing  GOAL:   Patient will meet greater than or equal to 90% of their needs  Unmet  MONITOR:   Diet advancement, Labs, Weight trends, TF tolerance, Skin, I & O's  REASON FOR ASSESSMENT:   Consult Enteral/tube feeding initiation and management  ASSESSMENT:   84 year old female who presented to the ED from SNF on 9/24 with worsening SOB. PMH of HTN, dementia, CHF, seizure disorder, oropharyngeal dysphagia. Pt admitted with sepsis secondary to multifocal HCAP with suspicion for aspiration.  09/25 - MBS with recommendation for dysphagia 2 diet with thin liquids 09/29 - NPO 10/01 - gastric Cortrak placed 10/05 - MBS showing worsening swallow function 10/15 - Cortrak removed 10/18 - diet advanced to dysphagia 1 with honey-thick liquids for comfort (SLP still recommending NPO)  Per notes, pt is not eligible for residential hospice but is appropriate for hospice. Plan is for pt to d/c to SNF with hospice.  Per nursing notes, PO intake is variable and pt occasionally holds food in mouth and requires suctioning.  Attempted to speak with pt at bedside. Pt sleeping soundly at time of RD visit and did not awaken to RD voice so RD did not disturb further.  Admit weight: 58.9 kg Current weight: 53.9 kg  Meal Completion: 5-100% (variable), no meal documentation since 10/24  Medications reviewed and include: senna, vitamin B-12  Labs reviewed.  Diet Order:   Diet Order            DIET - DYS 1 Room service appropriate? Yes; Fluid consistency: Honey Thick  Diet effective now                  EDUCATION NEEDS:   Not appropriate for education at this time  Skin:  Skin Assessment: Skin Integrity Issues: Other: skin tear  Last BM:  12/14/19  Height:   Ht Readings from Last 1 Encounters:  11/30/19 5\' 5"  (1.651 m)    Weight:   Wt Readings from Last 1 Encounters:  12/18/19 53.9 kg    Ideal Body Weight:  56.8 kg  BMI:  Body mass index is 19.77 kg/m.  Estimated Nutritional Needs:   Kcal:  1550-1750  Protein:  70-85 grams  Fluid:  >/= 1.5 L    Gaynell Face, MS, RD, LDN Inpatient Clinical Dietitian Please see AMiON for contact information.

## 2019-12-19 NOTE — TOC Transition Note (Signed)
Transition of Care [facility]) - CM/SW Discharge Note   Patient Details  Name: Nicole Blackwell MRN: [id number] Date of Birth: October 10, 1935  [facility] of [facility]) CM/SW Contact:  Loreta Ave, [place] Phone Number: 12/19/2019, 2:09 PM   Clinical Narrative:    Patient will DC to: [facility]  Anticipated DC date: 12/19/19 Family notified: Verdie Mosher Transport by: [facility]   Per MD patient ready for DC to [facility]. RN to call report prior to discharge [phone], room 307D. RN, patient, patient's family, and facility notified of DC. Discharge Summary and FL2 sent to facility. DC packet on chart. Ambulance transport requested for patient.   CSW will sign off for now as social work intervention is no longer needed. Please consult [place] again if new needs arise.     Final next level of care: Skilled Nursing Facility Barriers to Discharge: Barriers Resolved   Patient Goals and CMS Choice Patient states their goals for this hospitalization and ongoing recovery are:: Get better soon CMS Medicare.gov Compare Post Acute Care list provided to:: Patient Represenative (must comment) (Daughter) Choice offered to / list presented to : Adult Children  Discharge Placement PASRR number recieved: 12/19/19            Patient chooses bed at: [facility] Patient to be transferred to facility by: [facility] Transportation Name of family member notified: Katrece Roediger Patient and family notified of of transfer: 12/19/19  Discharge Plan and Services In-house Referral: Clinical Social Work                                   Social Determinants of Health (SDOH) Interventions     Readmission Risk Interventions No flowsheet data found.

## 2019-12-19 NOTE — Progress Notes (Signed)
    HD#32 Subjective:   No acute events overnight.  Evaluated at bedside during rounds. [name] states she is doing well. She is oriented to self but states she is at her house. She denies being in pain or hunger. Informed patient we are awaiting rehabilitation placement at this time, and she asked, "why?" Explanation provided, and she did not have follow-up questions.  Objective:   Vital signs in last 24 hours: Vitals:   12/18/19 0300 12/18/19 0433 12/18/19 0857 12/18/19 2114  BP:  (!) 147/76 (!) 142/73 (!) 160/88  Pulse:  96 87 86  Resp:  17 17 16   Temp:  98.9 F (37.2 C) (!) 97.2 F (36.2 C) 98.3 F (36.8 C)  TempSrc:  Oral  Oral  SpO2:  100% 100% 100%  Weight: 53.9 kg     Height:       Physical Exam:  Gen: Lying in bed, appears comfortable, alert and awake CV: regular rate and rhythm, no m/r/g Neuro:  Remains alert today with eyes open, voice is raspy Abd: soft, non-distended Ext: warm, no edema  Assessment/Plan:   Principal Problem:   HCAP (healthcare-associated pneumonia) Active Problems:   Chronic diastolic CHF (congestive heart failure) (HCC)   Vascular dementia without behavioral disturbance (HCC)   Dementia (HCC)   Protein-calorie malnutrition, severe (HCC)   Severe sepsis (HCC)   Goals of care, counseling/discussion   Palliative care by specialist   DNR (do not resuscitate) discussion   Encounter for hospice care discussion   Patient Summary:  Nicole Blackwell is a 84 year old woman with past medical history significant for dementia, HTN, diastolic heart failure, seizure disorder (on Depakote) oropharyngeal dysphagia, who presented from skilled nursing facility with worsening dyspnea and imaging suggestive of multifocal pneumonia, R>L likely secondary to aspiration.   This is hospital day 32. Patient will be discharging to skilled nursing facility [facility] for hospice care today.  Dysphagia Severe Malnutrition related to chronic illness  Stable  condition. S/p cortrak 10/1-10/15.  - Continue comfort feeds: dysphagia 1/honey thick liquids - Glycopyrrolate 0.2 mg IV Q8H PRN  Goals of Care Disposition Patient will be discharging to skilled nursing facility [facility] for hospice care today. - Two-provider DNR as of 12/07/19 - Patient's family supportive of hospice - PT: patient is not a candidate for continued rehab  Acute hypoxic resp failure and Sepsis 2/2 multifocal PNA R>L Resolved. Remains an aspiration due to dysphagia.  Moderate Diffuse Encephalopathy with Tremor, resolved History of Seizure Disorder  History of Dementia Stable.  - Valproate 200 mg solution four times daily - amantadine 25 mg solution once daily  Diet: dysphagia 1/honey thick liquids IVF: none VTE: Enoxaparin Code: DNR PT/OT recs: Long term care vs hospice  Please contact the on call pager after 5 pm and on weekends at [phone].  Alexandria Lodge, MD IMTS, PGY-1 Pager: [phone] 12/19/2019,6:09 AM

## 2019-12-19 NOTE — Plan of Care (Signed)
  Problem: Education: Goal: Knowledge of General Education information will improve Description: Including pain rating scale, medication(s)/side effects and non-pharmacologic comfort measures 12/19/2019 1558 by Shanon Ace, RN Outcome: Adequate for Discharge 12/19/2019 0720 by Shanon Ace, RN Outcome: Not Progressing   Problem: Health Behavior/Discharge Planning: Goal: Ability to manage health-related needs will improve 12/19/2019 1558 by Shanon Ace, RN Outcome: Adequate for Discharge 12/19/2019 0720 by Shanon Ace, RN Outcome: Not Progressing   Problem: Clinical Measurements: Goal: Ability to maintain clinical measurements within normal limits will improve 12/19/2019 1558 by Shanon Ace, RN Outcome: Adequate for Discharge 12/19/2019 0720 by Shanon Ace, RN Outcome: Not Progressing Goal: Will remain free from infection 12/19/2019 1558 by Shanon Ace, RN Outcome: Adequate for Discharge 12/19/2019 0720 by Shanon Ace, RN Outcome: Not Progressing Goal: Diagnostic test results will improve 12/19/2019 1558 by Shanon Ace, RN Outcome: Adequate for Discharge 12/19/2019 0720 by Shanon Ace, RN Outcome: Not Progressing Goal: Respiratory complications will improve 12/19/2019 1558 by Shanon Ace, RN Outcome: Adequate for Discharge 12/19/2019 0720 by Shanon Ace, RN Outcome: Not Progressing Goal: Cardiovascular complication will be avoided 12/19/2019 1558 by Shanon Ace, RN Outcome: Adequate for Discharge 12/19/2019 0720 by Shanon Ace, RN Outcome: Not Progressing   Problem: Activity: Goal: Risk for activity intolerance will decrease 12/19/2019 1558 by Shanon Ace, RN Outcome: Adequate for Discharge 12/19/2019 0720 by Shanon Ace, RN Outcome: Not Progressing   Problem: Nutrition: Goal: Adequate nutrition will be maintained 12/19/2019 1558 by Shanon Ace, RN Outcome: Adequate for Discharge 12/19/2019 0720 by Shanon Ace, RN Outcome: Not  Progressing   Problem: Coping: Goal: Level of anxiety will decrease 12/19/2019 1558 by Shanon Ace, RN Outcome: Adequate for Discharge 12/19/2019 0720 by Shanon Ace, RN Outcome: Not Progressing   Problem: Elimination: Goal: Will not experience complications related to bowel motility 12/19/2019 1558 by Shanon Ace, RN Outcome: Adequate for Discharge 12/19/2019 0720 by Shanon Ace, RN Outcome: Not Progressing Goal: Will not experience complications related to urinary retention 12/19/2019 1558 by Shanon Ace, RN Outcome: Adequate for Discharge 12/19/2019 0720 by Shanon Ace, RN Outcome: Not Progressing   Problem: Pain Managment: Goal: General experience of comfort will improve 12/19/2019 1558 by Shanon Ace, RN Outcome: Adequate for Discharge 12/19/2019 0720 by Shanon Ace, RN Outcome: Not Progressing   Problem: Safety: Goal: Ability to remain free from injury will improve 12/19/2019 1558 by Shanon Ace, RN Outcome: Adequate for Discharge 12/19/2019 0720 by Shanon Ace, RN Outcome: Not Progressing   Problem: Skin Integrity: Goal: Risk for impaired skin integrity will decrease 12/19/2019 1558 by Shanon Ace, RN Outcome: Adequate for Discharge 12/19/2019 0720 by Shanon Ace, RN Outcome: Not Progressing

## 2019-12-19 NOTE — NC FL2 (Signed)
[place] MEDICAID FL2 LEVEL OF CARE SCREENING TOOL     IDENTIFICATION  Patient Name: Nicole Blackwell Birthdate: Jun 28, 1935 Sex: female Admission Date (Current Location): 11/17/2019  [facility] and [place] Number:  Herbalist and Address:  [facility], [place] [address] [place], [place] [address]      Provider Number: [id number]  Attending Physician Name and Address:  Lucious Groves, DO  Relative Name and Phone Number:  Dimond Crotty [phone]    Current Level of Care: Hospital Recommended Level of Care: [place] Prior Approval Number:    Date Approved/Denied:   PASRR Number: pending  Discharge Plan: SNF    Current Diagnoses: Patient Active Problem List   Diagnosis Date Noted  . Encounter for hospice care discussion   . Goals of care, counseling/discussion   . Palliative care by specialist   . DNR (do not resuscitate) discussion   . HCAP (healthcare-associated pneumonia) 11/17/2019  . Severe sepsis ([place])   . AKI (acute kidney injury) ([place])   . Lactic acidosis   . Orthostatic hypotension   . Unsteadiness 02/28/2019  . Suspected cerebrovascular accident (CVA) 02/28/2019  . Acute encephalopathy 02/24/2019  . Lethargy 02/24/2019  . AMS (altered mental status) 12/17/2018  . Fatigue 08/27/2018  . Protein-calorie malnutrition, severe ([place]) 05/24/2018  . Hypoalbuminemia 05/23/2018  . Abnormal EEG 05/22/2018  . Dementia ([place]) 05/21/2018  . Fall 05/20/2018  . Rhabdomyolysis 05/20/2018  . Bilateral lower extremity edema 05/20/2018  . Serum total bilirubin elevated 05/20/2018  . B12 deficiency 01/17/2018  . Vascular dementia without behavioral disturbance ([place]) 01/17/2018  . Laceration of scalp without foreign body   . Chronic diastolic CHF (congestive heart failure) ([place]) 12/13/2014  . Syncope 01/12/2012  . BACK PAIN 04/30/2008  . SHINGLES 09/27/2007  . SYMPTOMATIC MENOPAUSAL/FEMALE CLIMACTERIC STATES 06/20/2007  .  Osteoporosis 03/11/2007  . PLANTAR FASCIITIS 11/16/2006    Orientation RESPIRATION BLADDER Height & Weight     Self  Normal Incontinent, External catheter Weight: 118 lb 13.3 oz (53.9 kg) Height:  5\' 5"  (165.1 cm)  BEHAVIORAL SYMPTOMS/MOOD NEUROLOGICAL BOWEL NUTRITION STATUS      Incontinent Diet (see dc summary)  AMBULATORY STATUS COMMUNICATION OF NEEDS Skin   Total Care Verbally Other (Comment) (Skin tear, 12/10/19, pressure injury sacrum posterior medial stage 2 12/19/19)                       Personal Care Assistance Level of Assistance  Bathing, Feeding, Dressing, Total care Bathing Assistance: Maximum assistance Feeding assistance: Maximum assistance Dressing Assistance: Maximum assistance Total Care Assistance: Maximum assistance   Functional Limitations Info  Sight, Hearing, Speech Sight Info: Adequate Hearing Info: Adequate Speech Info: Adequate    SPECIAL CARE FACTORS FREQUENCY                       Contractures Contractures Info: Not present    Additional Factors Info  Code Status, Allergies, Psychotropic Code Status Info: DNR Allergies Info: Penicillin G Benzathine Penicillins Psychotropic Info: NA         Current Medications (12/19/2019):  This is the current hospital active medication list Current Facility-Administered Medications  Medication Dose Route Frequency Provider Last Rate Last Admin  . acetaminophen (TYLENOL) suppository 80 mg  80 mg Rectal Q6H PRN Maudie Mercury, MD   80 mg at 12/09/19 1805  . acetaminophen (TYLENOL) tablet 500 mg  500 mg Oral Q6H PRN Lucious Groves,  DO   500 mg at 12/19/19 0813  . amantadine (SYMMETREL) 50 MG/5ML solution 25 mg  25 mg Oral Daily Joni Reining C, DO   25 mg at 12/19/19 7253  . aspirin chewable tablet 81 mg  81 mg Oral QHS Lucious Groves, DO   81 mg at 12/18/19 2108  . chlorhexidine (PERIDEX) 0.12 % solution 15 mL  15 mL Mouth Rinse BID Candee Furbish, MD   15 mL at 12/19/19 0814  . enoxaparin  (LOVENOX) injection 40 mg  40 mg Subcutaneous Daily Masoudi, Elhamalsadat, MD   40 mg at 12/19/19 0813  . Gerhardt's butt cream   Topical BID Lucious Groves, DO   Given at 12/19/19 0815  . glycopyrrolate (ROBINUL) injection 0.2 mg  0.2 mg Intravenous Q8H PRN Andrew Au, MD   0.2 mg at 12/08/19 2137  . LORazepam (ATIVAN) injection 1 mg  1 mg Intravenous PRN Lora Havens, MD      . MEDLINE mouth rinse  15 mL Mouth Rinse q12n4p Candee Furbish, MD   15 mL at 12/18/19 1904  . Resource ThickenUp Clear   Oral PRN Lucious Groves, DO      . senna-docusate (Senokot-S) tablet 1 tablet  1 tablet Oral BID Lucious Groves, DO   1 tablet at 12/19/19 6644  . valproic acid (DEPAKENE) 250 MG/5ML solution 200 mg  200 mg Per Tube QID Einar Grad, RPH   200 mg at 12/19/19 0347  . vitamin B-12 (CYANOCOBALAMIN) tablet 100 mcg  100 mcg Oral Daily Alexandria Lodge, MD   100 mcg at 12/19/19 4259     Discharge Medications: Please see discharge summary for a list of discharge medications.  Relevant Imaging Results:  Relevant Lab Results:   Additional Information    Jaszmine Navejas B Rogerio Boutelle, LCSWA

## 2019-12-19 NOTE — Plan of Care (Signed)

## 2019-12-19 NOTE — Progress Notes (Signed)
RE: Nicole Blackwell Date of Birth: 01-08-2036 Date: 12/19/19  Please be advised that the above-named patient will require a short-term nursing home stay - anticipated 30 days or less for rehabilitation and strengthening.  The plan is for return home.

## 2020-01-09 ENCOUNTER — Ambulatory Visit: Payer: Medicare (Managed Care) | Admitting: Diagnostic Neuroimaging

## 2020-01-24 DEATH — deceased
